# Patient Record
Sex: Male | Born: 1945
Health system: Southern US, Community
[De-identification: ages and names within clinical notes are randomized; demographics above are authoritative.]

## PROBLEM LIST (undated history)

## (undated) DIAGNOSIS — K2970 Gastritis, unspecified, without bleeding: Secondary | ICD-10-CM

## (undated) DIAGNOSIS — E7800 Pure hypercholesterolemia, unspecified: Secondary | ICD-10-CM

## (undated) DIAGNOSIS — I2510 Atherosclerotic heart disease of native coronary artery without angina pectoris: Secondary | ICD-10-CM

## (undated) DIAGNOSIS — E46 Unspecified protein-calorie malnutrition: Secondary | ICD-10-CM

## (undated) DIAGNOSIS — Z720 Tobacco use: Secondary | ICD-10-CM

## (undated) DIAGNOSIS — I219 Acute myocardial infarction, unspecified: Secondary | ICD-10-CM

## (undated) DIAGNOSIS — M1990 Unspecified osteoarthritis, unspecified site: Secondary | ICD-10-CM

## (undated) DIAGNOSIS — F32A Depression, unspecified: Secondary | ICD-10-CM

## (undated) DIAGNOSIS — K2210 Ulcer of esophagus without bleeding: Secondary | ICD-10-CM

## (undated) DIAGNOSIS — I10 Essential (primary) hypertension: Secondary | ICD-10-CM

## (undated) DIAGNOSIS — K219 Gastro-esophageal reflux disease without esophagitis: Secondary | ICD-10-CM

## (undated) DIAGNOSIS — F329 Major depressive disorder, single episode, unspecified: Secondary | ICD-10-CM

## (undated) DIAGNOSIS — I714 Abdominal aortic aneurysm, without rupture, unspecified: Secondary | ICD-10-CM

## (undated) DIAGNOSIS — J9383 Other pneumothorax: Secondary | ICD-10-CM

## (undated) DIAGNOSIS — R001 Bradycardia, unspecified: Secondary | ICD-10-CM

## (undated) DIAGNOSIS — L0390 Cellulitis, unspecified: Secondary | ICD-10-CM

## (undated) DIAGNOSIS — J939 Pneumothorax, unspecified: Secondary | ICD-10-CM

## (undated) DIAGNOSIS — D649 Anemia, unspecified: Secondary | ICD-10-CM

## (undated) DIAGNOSIS — Z9889 Other specified postprocedural states: Secondary | ICD-10-CM

## (undated) DIAGNOSIS — K1120 Sialoadenitis, unspecified: Secondary | ICD-10-CM

## (undated) DIAGNOSIS — O2230 Deep phlebothrombosis in pregnancy, unspecified trimester: Secondary | ICD-10-CM

## (undated) DIAGNOSIS — K5792 Diverticulitis of intestine, part unspecified, without perforation or abscess without bleeding: Secondary | ICD-10-CM

## (undated) DIAGNOSIS — R112 Nausea with vomiting, unspecified: Secondary | ICD-10-CM

## (undated) DIAGNOSIS — E349 Endocrine disorder, unspecified: Secondary | ICD-10-CM

## (undated) DIAGNOSIS — D46Z Other myelodysplastic syndromes: Principal | ICD-10-CM

## (undated) HISTORY — DX: Atherosclerotic heart disease of native coronary artery without angina pectoris: I25.10

## (undated) HISTORY — PX: FETAL BLOOD TRANSFUSION: SHX1602

## (undated) HISTORY — DX: Other myelodysplastic syndromes: D46.Z

## (undated) HISTORY — PX: BACK SURGERY: SHX140

## (undated) HISTORY — PX: EYE SURGERY: SHX253

## (undated) HISTORY — DX: Endocrine disorder, unspecified: E34.9

## (undated) HISTORY — DX: Pure hypercholesterolemia, unspecified: E78.00

---

## 1999-02-15 ENCOUNTER — Encounter: Admission: RE | Admit: 1999-02-15 | Discharge: 1999-02-15 | Payer: Self-pay | Admitting: Family Medicine

## 1999-02-15 ENCOUNTER — Encounter: Payer: Self-pay | Admitting: Family Medicine

## 1999-11-02 ENCOUNTER — Encounter: Payer: Self-pay | Admitting: Physical Medicine and Rehabilitation

## 1999-11-02 ENCOUNTER — Encounter
Admission: RE | Admit: 1999-11-02 | Discharge: 1999-11-02 | Payer: Self-pay | Admitting: Physical Medicine and Rehabilitation

## 2004-10-01 ENCOUNTER — Encounter: Admission: RE | Admit: 2004-10-01 | Discharge: 2004-10-01 | Payer: Self-pay | Admitting: Family Medicine

## 2007-10-17 ENCOUNTER — Emergency Department (HOSPITAL_COMMUNITY): Admission: EM | Admit: 2007-10-17 | Discharge: 2007-10-17 | Payer: Self-pay | Admitting: Emergency Medicine

## 2008-01-15 DIAGNOSIS — I219 Acute myocardial infarction, unspecified: Secondary | ICD-10-CM

## 2008-01-15 HISTORY — PX: OTHER SURGICAL HISTORY: SHX169

## 2008-01-15 HISTORY — DX: Acute myocardial infarction, unspecified: I21.9

## 2008-10-20 ENCOUNTER — Ambulatory Visit: Payer: Self-pay | Admitting: Cardiovascular Disease

## 2008-10-20 ENCOUNTER — Inpatient Hospital Stay (HOSPITAL_COMMUNITY): Admission: EM | Admit: 2008-10-20 | Discharge: 2008-10-22 | Payer: Self-pay | Admitting: Cardiovascular Disease

## 2008-10-21 ENCOUNTER — Encounter: Payer: Self-pay | Admitting: Cardiovascular Disease

## 2008-10-23 ENCOUNTER — Telehealth (INDEPENDENT_AMBULATORY_CARE_PROVIDER_SITE_OTHER): Payer: Self-pay | Admitting: Physician Assistant

## 2008-10-24 ENCOUNTER — Encounter: Payer: Self-pay | Admitting: Cardiovascular Disease

## 2008-10-25 ENCOUNTER — Ambulatory Visit (HOSPITAL_COMMUNITY): Admission: RE | Admit: 2008-10-25 | Discharge: 2008-10-25 | Payer: Self-pay | Admitting: Cardiovascular Disease

## 2008-10-25 ENCOUNTER — Ambulatory Visit: Payer: Self-pay | Admitting: Cardiovascular Disease

## 2008-10-31 ENCOUNTER — Encounter: Payer: Self-pay | Admitting: Cardiovascular Disease

## 2008-11-01 ENCOUNTER — Telehealth: Payer: Self-pay | Admitting: Cardiovascular Disease

## 2008-11-10 ENCOUNTER — Telehealth: Payer: Self-pay | Admitting: Cardiovascular Disease

## 2008-11-11 DIAGNOSIS — I2510 Atherosclerotic heart disease of native coronary artery without angina pectoris: Secondary | ICD-10-CM

## 2008-11-11 DIAGNOSIS — I10 Essential (primary) hypertension: Secondary | ICD-10-CM | POA: Insufficient documentation

## 2008-11-14 ENCOUNTER — Telehealth: Payer: Self-pay | Admitting: Cardiovascular Disease

## 2008-11-14 ENCOUNTER — Telehealth (INDEPENDENT_AMBULATORY_CARE_PROVIDER_SITE_OTHER): Payer: Self-pay | Admitting: *Deleted

## 2008-11-15 ENCOUNTER — Ambulatory Visit: Payer: Self-pay | Admitting: Cardiology

## 2008-11-15 ENCOUNTER — Encounter (HOSPITAL_COMMUNITY): Admission: RE | Admit: 2008-11-15 | Discharge: 2009-01-12 | Payer: Self-pay | Admitting: Cardiovascular Disease

## 2008-11-15 ENCOUNTER — Ambulatory Visit: Payer: Self-pay

## 2008-11-15 ENCOUNTER — Ambulatory Visit: Payer: Self-pay | Admitting: Cardiovascular Disease

## 2008-11-18 ENCOUNTER — Telehealth: Payer: Self-pay | Admitting: Nurse Practitioner

## 2008-12-16 ENCOUNTER — Telehealth: Payer: Self-pay | Admitting: Cardiovascular Disease

## 2008-12-17 ENCOUNTER — Telehealth (INDEPENDENT_AMBULATORY_CARE_PROVIDER_SITE_OTHER): Payer: Self-pay | Admitting: Physician Assistant

## 2008-12-20 ENCOUNTER — Ambulatory Visit: Payer: Self-pay | Admitting: Cardiovascular Disease

## 2009-01-12 ENCOUNTER — Encounter: Payer: Self-pay | Admitting: Cardiovascular Disease

## 2009-01-30 ENCOUNTER — Ambulatory Visit: Payer: Self-pay | Admitting: Cardiovascular Disease

## 2009-05-02 ENCOUNTER — Ambulatory Visit: Payer: Self-pay | Admitting: Cardiovascular Disease

## 2009-05-03 LAB — CONVERTED CEMR LAB
ALT: 32 units/L (ref 0–53)
Albumin: 4.1 g/dL (ref 3.5–5.2)
Cholesterol: 137 mg/dL (ref 0–200)
HDL: 32 mg/dL — ABNORMAL LOW (ref >39.00)
Total Protein: 7.3 g/dL (ref 6.0–8.3)
VLDL: 73 mg/dL — ABNORMAL HIGH (ref 0.0–40.0)

## 2009-08-02 ENCOUNTER — Ambulatory Visit: Payer: Self-pay | Admitting: Cardiovascular Disease

## 2009-08-17 ENCOUNTER — Telehealth: Payer: Self-pay | Admitting: Cardiovascular Disease

## 2009-08-17 LAB — CONVERTED CEMR LAB
ALT: 30 units/L (ref 0–53)
AST: 28 units/L (ref 0–37)
Alkaline Phosphatase: 78 units/L (ref 39–117)
Cholesterol: 151 mg/dL (ref 0–200)
Direct LDL: 42.9 mg/dL
Total Bilirubin: 0.4 mg/dL (ref 0.3–1.2)
Total CHOL/HDL Ratio: 6
Triglycerides: 438 mg/dL — ABNORMAL HIGH (ref 0.0–149.0)

## 2009-09-14 ENCOUNTER — Telehealth: Payer: Self-pay | Admitting: Cardiovascular Disease

## 2009-10-25 ENCOUNTER — Telehealth: Payer: Self-pay | Admitting: Cardiovascular Disease

## 2009-11-20 ENCOUNTER — Ambulatory Visit: Payer: Self-pay | Admitting: Cardiovascular Disease

## 2009-11-23 LAB — CONVERTED CEMR LAB
Alkaline Phosphatase: 77 units/L (ref 39–117)
Bilirubin, Direct: 0.1 mg/dL (ref 0.0–0.3)
HDL: 26.8 mg/dL — ABNORMAL LOW (ref >39.00)
Total Bilirubin: 0.5 mg/dL (ref 0.3–1.2)
Total Protein: 6.9 g/dL (ref 6.0–8.3)
VLDL: 54.6 mg/dL — ABNORMAL HIGH (ref 0.0–40.0)

## 2010-02-11 LAB — CONVERTED CEMR LAB
ALT: 37 units/L (ref 0–53)
AST: 29 units/L (ref 0–37)
Albumin: 4.3 g/dL (ref 3.5–5.2)
Alkaline Phosphatase: 80 units/L (ref 39–117)
BUN: 22 mg/dL (ref 6–23)
Bilirubin, Direct: 0 mg/dL (ref 0.0–0.3)
CO2: 27 meq/L (ref 19–32)
Calcium: 9.3 mg/dL (ref 8.4–10.5)
Chloride: 105 meq/L (ref 96–112)
Cholesterol: 130 mg/dL (ref 0–200)
Creatinine, Ser: 1.3 mg/dL (ref 0.4–1.5)
Direct LDL: 53.5 mg/dL
GFR calc non Af Amer: 59.45 mL/min (ref >60)
Glucose, Bld: 108 mg/dL — ABNORMAL HIGH (ref 70–99)
HDL: 27.1 mg/dL — ABNORMAL LOW (ref >39.00)
Potassium: 4.2 meq/L (ref 3.5–5.1)
Sodium: 140 meq/L (ref 135–145)
Total Bilirubin: 0.9 mg/dL (ref 0.3–1.2)
Total CHOL/HDL Ratio: 5
Total Protein: 7.6 g/dL (ref 6.0–8.3)
Triglycerides: 231 mg/dL — ABNORMAL HIGH (ref 0.0–149.0)
VLDL: 46.2 mg/dL — ABNORMAL HIGH (ref 0.0–40.0)

## 2010-02-15 NOTE — Letter (Signed)
Summary: [facility]  [facility]   Imported By: Marylou Mccoy 02/01/2009 18:26:40  _____________________________________________________________________  External Attachment:    Type:   Image     Comment:   External Document

## 2010-02-15 NOTE — Assessment & Plan Note (Signed)
Summary: per check out/sf   Visit Type:  Follow-up Primary Provider:  Dr Tiburcio Pea   History of Present Illness: This is a 65 year old gentleman who presented with an inferior myocardial infarction in October of 2010. He was treated with stenting of the right coronary artery, which was totally occluded. He was also noted to have moderate distal left main stem disease and moderate left circumflex stenosis. He underwent followup intravascular ultrasound of left mainstem which confirmed moderate stenosis. He presents today for followup evaluation.  He feels well at present. He is exercising regularly without exertional symptoms. Previously was concerned about diaphoresis with activity but this has resolved since he stopped Chantix. The patient denies chest pain, dyspnea, orthopnea, PND, edema, palpitations, lightheadedness, or syncope.   Current Medications (verified): 1)  Metoprolol Tartrate 25 Mg Tabs (Metoprolol Tartrate) .... Take One-Half  Tablet By Mouth Twice A Day 2)  Zocor 40 Mg Tabs (Simvastatin) .... Take 1 Tab By Mouth At Bedtime 3)  Aspirin Ec 325 Mg Tbec (Aspirin) .... Take One Tablet By Mouth Daily 4)  Effient 10 Mg Tabs (Prasugrel Hcl) .... Take 1 Tablet By Mouth Once A Day 5)  Nitrostat 0.4 Mg Subl (Nitroglycerin) .Marland Kitchen.. 1 Tablet Under Tongue At Onset of Chest Pain; You May Repeat Every 5 Minutes For Up To 3 Doses. 6)  Xanax 0.5 Mg Tabs (Alprazolam) .... As Needed 7)  Fexofenadine Hcl 180 Mg Tabs (Fexofenadine Hcl) .... Take 1 Tablet By Mouth Once A Day 8)  Fish Oil 1000 Mg Caps (Omega-3 Fatty Acids) .... Take 1 Capsule By Mouth Once A Day 9)  Multivitamins  Tabs (Multiple Vitamin) .... Take 1 Tablet By Mouth Once A Day  Allergies (verified): No Known Drug Allergies  Past History:  Past medical history reviewed for relevance to current acute and chronic problems.  Past Medical History: 1. CAD status post inferior MI October 2010 and residual moderate left main disease 2.  Dyslipidemia 3. Anxiety disorder not otherwise specified  Review of Systems       Negative except as per HPI   Vital Signs:  Patient profile:   65 year old male Height:      71 inches Weight:      203 pounds BMI:     28.42 Pulse rate:   60 / minute Pulse rhythm:   regular Resp:     18 per minute BP sitting:   120 / 80  (left arm) Cuff size:   large  Vitals Entered By: Vikki Ports (January 30, 2009 10:11 AM)  Physical Exam  General:  Pt is alert and oriented, in no acute distress. HEENT: normal Neck: normal carotid upstrokes without bruits, JVP normal Lungs: CTA CV: RRR without murmur or gallop Abd: soft, NT, positive BS, no bruit, no organomegaly Ext: no clubbing, cyanosis, or edema. peripheral pulses 2+ and equal Skin: warm and dry without rash    Impression & Recommendations:  Problem # 1:  CAD (ICD-414.00) Stable without angina. Left main was borderline by IVUS, but myoview stress was negative and pt is without symptoms at present. Continue current medical therapy. At 3 month f/u visit, will decrease ASA dose to 81 mg since he is on Effient.  His updated medication list for this problem includes:    Metoprolol Tartrate 25 Mg Tabs (Metoprolol tartrate) .Marland Kitchen... Take one-half  tablet by mouth twice a day    Aspirin Ec 325 Mg Tbec (Aspirin) .Marland Kitchen... Take one tablet by mouth daily    Effient 10 Mg  Tabs (Prasugrel hcl) .Marland Kitchen... Take 1 tablet by mouth once a day    Nitrostat 0.4 Mg Subl (Nitroglycerin) .Marland Kitchen... 1 tablet under tongue at onset of chest pain; you may repeat every 5 minutes for up to 3 doses.  Problem # 2:  HYPERTENSION, UNSPECIFIED (ICD-401.9) BP controlled on metoprolol alone.  His updated medication list for this problem includes:    Metoprolol Tartrate 25 Mg Tabs (Metoprolol tartrate) .Marland Kitchen... Take one-half  tablet by mouth twice a day    Aspirin Ec 325 Mg Tbec (Aspirin) .Marland Kitchen... Take one tablet by mouth daily  BP today: 120/80 Prior BP: 128/79 (12/20/2008)  Labs  Reviewed: K+: 4.2 (12/20/2008) Creat: : 1.3 (12/20/2008)   Chol: 130 (12/20/2008)   HDL: 27.10 (12/20/2008)   TG: 231.0 (12/20/2008)  Problem # 3:  HYPERLIPIDEMIA (ICD-272.4) LDL 53 at recent check, LFT's within normal limits. Continue current therapy.  His updated medication list for this problem includes:    Zocor 40 Mg Tabs (Simvastatin) .Marland Kitchen... Take 1 tab by mouth at bedtime  CHOL: 130 (12/20/2008)   HDL: 27.10 (12/20/2008)   TG: 231.0 (12/20/2008)  Patient Instructions: 1)  Your physician recommends that you schedule a follow-up appointment in: 3 months 2)  Your physician recommends that you return for lab work in: at your next visitliver/lipids please fast

## 2010-02-15 NOTE — Progress Notes (Signed)
Summary: Lab results  Phone Note Call from Patient Call back at Home Phone [phone]   Caller: Patient Reason for Call: Lab or Test Results Initial call taken by: Judie Grieve,  August 17, 2009 2:05 PM  Follow-up for Phone Call        Left message for pt to call back. Julieta Gutting, RN, BSN  August 17, 2009 4:19 PM  The pt called back and was given instructions to stop simvastatin and start Crestor and Niaspan.  The pt was given samples that would last one month.  Crestor 10mg  #28 and Niaspan 500mg  #42. The pt will start Niaspan 500mg  daily for 2 weeks then increase to 1000mg  daily.  Information given about side effects and taking ASA 30 minutes prior to Niaspan.  The pt will call back in one month to let us know how he tolerates these medications and to obtain prescriptions.  Labs will be rechecked the week of 11/20/09.  All instructions were written out for the pt and placed at the front desk with samples.  Follow-up by: Julieta Gutting, RN, BSN,  August 17, 2009 4:51 PM    New/Updated Medications: CRESTOR 10 MG TABS (ROSUVASTATIN CALCIUM) Take one tablet by mouth daily at bedtime NIASPAN 500 MG CR-TABS (NIACIN (ANTIHYPERLIPIDEMIC)) take as directed

## 2010-02-15 NOTE — Progress Notes (Signed)
Summary: Cholesterol Med  Phone Note Call from Patient   Caller: Patient Reason for Call: Talk to Nurse Summary of Call: pt has a question re med-pls call [phone] Initial call taken by: Glynda Jaeger,  September 14, 2009 10:19 AM  Follow-up for Phone Call        Left message to call back. Julieta Gutting, RN, BSN  September 14, 2009 1:41 PM  I spoke with the pt and he tolerated the Crestor but could not tolerate the 1000mg  of Niaspan due to stomach upset. The pt did fine on 500mg  of Niaspan.  The pt will stay on Crestor 10mg  and Niaspan 500mg  daily.  The pt is scheduled for f/u labs.  Rx sent to pharmacy.    Follow-up by: Julieta Gutting, RN, BSN,  September 14, 2009 3:28 PM    New/Updated Medications: NIASPAN 500 MG CR-TABS (NIACIN (ANTIHYPERLIPIDEMIC)) take one tablet daily Prescriptions: CRESTOR 10 MG TABS (ROSUVASTATIN CALCIUM) Take one tablet by mouth daily at bedtime  #30 x 6   Entered by:   Julieta Gutting, RN, BSN   Authorized by:   Norva Karvonen, MD   Signed by:   Julieta Gutting, RN, BSN on 09/14/2009   Method used:   Electronically to        [facility] (retail)       [address]       [place], [place]  [address]       Ph: [phone]       Fax: [phone]   RxID:   [id number] NIASPAN 500 MG CR-TABS (NIACIN (ANTIHYPERLIPIDEMIC)) take one tablet daily  #30 x 6   Entered by:   Julieta Gutting, RN, BSN   Authorized by:   Norva Karvonen, MD   Signed by:   Julieta Gutting, RN, BSN on 09/14/2009   Method used:   Electronically to        [facility] (retail)       [address]       [place], [place]  [address]       Ph: [phone]       Fax: [phone]   RxID:   [id number]

## 2010-02-15 NOTE — Assessment & Plan Note (Signed)
Summary: 3 MONTH ROV   Visit Type:  Follow-up Primary Provider:  Dr Tiburcio Pea  CC:  none.  History of Present Illness: This is a 65 year old gentleman who presented with an inferior myocardial infarction in October of 2010. He was treated with stenting of the right coronary artery, which was totally occluded. He was also noted to have moderate distal left main stem disease and moderate left circumflex stenosis. He underwent followup intravascular ultrasound of left mainstem which confirmed moderate stenosis. He presents today for followup evaluation.  He has gained weight since quitting cigarettes. Otherwise doing well. No complaints - denies chest pain, dyspnea, orthopnea, PND, edema, palpitations, lightheadedness, or syncope.  He has previously had some diaphoretic spells, but this was attributed to Chantix and these have stopped since off of this medication.    Current Medications (verified): 1)  Metoprolol Tartrate 25 Mg Tabs (Metoprolol Tartrate) .... Take One-Half  Tablet By Mouth Twice A Day 2)  Zocor 40 Mg Tabs (Simvastatin) .... Take 1 Tab By Mouth At Bedtime 3)  Aspirin Ec 325 Mg Tbec (Aspirin) .... Take One Tablet By Mouth Daily 4)  Effient 10 Mg Tabs (Prasugrel Hcl) .... Take 1 Tablet By Mouth Once A Day 5)  Nitrostat 0.4 Mg Subl (Nitroglycerin) .Marland Kitchen.. 1 Tablet Under Tongue At Onset of Chest Pain; You May Repeat Every 5 Minutes For Up To 3 Doses. 6)  Xanax 0.5 Mg Tabs (Alprazolam) .... As Needed 7)  Fexofenadine Hcl 180 Mg Tabs (Fexofenadine Hcl) .... Take 1 Tablet By Mouth Once A Day 8)  Fish Oil 1000 Mg Caps (Omega-3 Fatty Acids) .... Take 1 Capsule By Mouth Once A Day 9)  Multivitamins  Tabs (Multiple Vitamin) .... Take 1 Tablet By Mouth Once A Day 10)  Amoxicillin 500 Mg Caps (Amoxicillin) .Marland Kitchen.. 1 Capsule Two Times A Day  Allergies (verified): No Known Drug Allergies  Past History:  Past medical history reviewed for relevance to current acute and chronic problems.  Past  Medical History: Reviewed history from 01/30/2009 and no changes required. 1. CAD status post inferior MI October 2010 and residual moderate left main disease 2. Dyslipidemia 3. Anxiety disorder not otherwise specified  Review of Systems       Negative except as per HPI   Vital Signs:  Patient profile:   65 year old male Height:      71 inches Weight:      208 pounds BMI:     29.11 Pulse rate:   53 / minute Resp:     16 per minute BP sitting:   130 / 90  (left arm)  Vitals Entered By: Laurance Flatten CMA (May 02, 2009 9:47 AM)  Physical Exam  General:  Pt is alert and oriented, obese male, in no acute distress. HEENT: normal Neck: normal carotid upstrokes without bruits, JVP normal Lungs: CTA CV: RRR without murmur or gallop Abd: soft, NT, positive BS, no bruit, no organomegaly Ext: no clubbing, cyanosis, or edema. peripheral pulses 2+ and equal Skin: warm and dry without rash    Impression & Recommendations:  Problem # 1:  CAD (ICD-414.00) Pt s/p MI with residual left main CAD and negative Myoview post-MI. Will continue his current medical program. Encouraged increased exercise and dietary modification. Will reduce ASA dose to 81 mg since the pt is on prasugrel (to lower long-term bleeding risk).  His updated medication list for this problem includes:    Metoprolol Tartrate 25 Mg Tabs (Metoprolol tartrate) .Marland Kitchen... Take one-half  tablet by  mouth twice a day    Aspirin 81 Mg Tbec (Aspirin) .Marland Kitchen... Take one tablet by mouth daily    Effient 10 Mg Tabs (Prasugrel hcl) .Marland Kitchen... Take 1 tablet by mouth once a day    Nitrostat 0.4 Mg Subl (Nitroglycerin) .Marland Kitchen... 1 tablet under tongue at onset of chest pain; you may repeat every 5 minutes for up to 3 doses.  Orders: TLB-Lipid Panel (80061-LIPID) TLB-Hepatic/Liver Function Pnl (80076-HEPATIC)  Problem # 2:  HYPERLIPIDEMIA (ICD-272.4) Check lipids. Pt with LDL at goal but HDL cholesterol has been low. Will review lipid panel when  results back.  His updated medication list for this problem includes:    Zocor 40 Mg Tabs (Simvastatin) .Marland Kitchen... Take 1 tab by mouth at bedtime  Orders: TLB-Lipid Panel (80061-LIPID) TLB-Hepatic/Liver Function Pnl (80076-HEPATIC)  CHOL: 130 (12/20/2008)   HDL: 27.10 (12/20/2008)   TG: 231.0 (12/20/2008)  Problem # 3:  HYPERTENSION, UNSPECIFIED (ICD-401.9) BP borderline elevated. Trial of lifestyle modification.  His updated medication list for this problem includes:    Metoprolol Tartrate 25 Mg Tabs (Metoprolol tartrate) .Marland Kitchen... Take one-half  tablet by mouth twice a day    Aspirin 81 Mg Tbec (Aspirin) .Marland Kitchen... Take one tablet by mouth daily  Orders: TLB-Lipid Panel (80061-LIPID) TLB-Hepatic/Liver Function Pnl (80076-HEPATIC)  BP today: 130/90 Prior BP: 120/80 (01/30/2009)  Labs Reviewed: K+: 4.2 (12/20/2008) Creat: : 1.3 (12/20/2008)   Chol: 130 (12/20/2008)   HDL: 27.10 (12/20/2008)   TG: 231.0 (12/20/2008)  Patient Instructions: 1)  Your physician recommends that you have a lipid and liver profile today.  2)  Your physician has recommended you make the following change in your medication: DECREASE Aspirin to 81mg  once a day 3)  Your physician wants you to follow-up in:   6 MONTHS. You will receive a reminder letter in the mail two months in advance. If you don't receive a letter, please call our office to schedule the follow-up appointment.

## 2010-02-15 NOTE — Progress Notes (Signed)
Summary: D/C Niaspan  Phone Note Call from Patient   Caller: Patient [phone] Reason for Call: Talk to Nurse Summary of Call: pt calling has question re med Initial call taken by: Glynda Jaeger,  October 25, 2009 8:59 AM  Follow-up for Phone Call        I spoke with the pt and he cannot tolerate Niaspan.  The pt said he cannot sleep at night and feels warm.  The pt will stop Niaspan at this time.  The pt will have scheduled labs drawn in November.  Follow-up by: Julieta Gutting, RN, BSN,  October 25, 2009 10:00 AM

## 2010-04-19 LAB — COMPREHENSIVE METABOLIC PANEL
ALT: 29 U/L (ref 0–53)
Alkaline Phosphatase: 89 U/L (ref 39–117)
BUN: 19 mg/dL (ref 6–23)
CO2: 25 mEq/L (ref 19–32)
Calcium: 8.7 mg/dL (ref 8.4–10.5)
GFR calc non Af Amer: 51 mL/min — ABNORMAL LOW (ref >60)
Glucose, Bld: 111 mg/dL — ABNORMAL HIGH (ref 70–99)
Potassium: 4.4 mEq/L (ref 3.5–5.1)
Total Protein: 6.9 g/dL (ref 6.0–8.3)

## 2010-04-19 LAB — PROTIME-INR: Prothrombin Time: 19.5 seconds — ABNORMAL HIGH (ref 11.6–15.2)

## 2010-04-19 LAB — BASIC METABOLIC PANEL
BUN: 15 mg/dL (ref 6–23)
CO2: 23 mEq/L (ref 19–32)
Calcium: 8.8 mg/dL (ref 8.4–10.5)
GFR calc Af Amer: >60 mL/min (ref >60)
GFR calc non Af Amer: >60 mL/min (ref >60)
GFR calc non Af Amer: >60 mL/min (ref >60)
Glucose, Bld: 111 mg/dL — ABNORMAL HIGH (ref 70–99)
Potassium: 3.8 mEq/L (ref 3.5–5.1)
Sodium: 134 mEq/L — ABNORMAL LOW (ref 135–145)
Sodium: 140 mEq/L (ref 135–145)

## 2010-04-19 LAB — CBC
HCT: 39.1 % (ref 39.0–52.0)
Hemoglobin: 12.7 g/dL — ABNORMAL LOW (ref 13.0–17.0)
Hemoglobin: 13.5 g/dL (ref 13.0–17.0)
MCHC: 34.4 g/dL (ref 30.0–36.0)
MCHC: 34.7 g/dL (ref 30.0–36.0)
MCHC: 35.2 g/dL (ref 30.0–36.0)
MCV: 95.8 fL (ref 78.0–100.0)
Platelets: 209 10*3/uL (ref 150–400)
RBC: 4.1 MIL/uL — ABNORMAL LOW (ref 4.22–5.81)
RDW: 13.8 % (ref 11.5–15.5)
RDW: 13.9 % (ref 11.5–15.5)
RDW: 14.3 % (ref 11.5–15.5)

## 2010-04-19 LAB — CARDIAC PANEL(CRET KIN+CKTOT+MB+TROPI)
CK, MB: 15.1 ng/mL — ABNORMAL HIGH (ref 0.3–4.0)
CK, MB: 48.1 ng/mL — ABNORMAL HIGH (ref 0.3–4.0)
CK, MB: 68 ng/mL — ABNORMAL HIGH (ref 0.3–4.0)
Relative Index: 7.3 — ABNORMAL HIGH (ref 0.0–2.5)
Total CK: 478 U/L — ABNORMAL HIGH (ref 7–232)
Total CK: 656 U/L — ABNORMAL HIGH (ref 7–232)
Total CK: 800 U/L — ABNORMAL HIGH (ref 7–232)
Troponin I: 2.79 ng/mL (ref 0.00–0.06)

## 2010-04-19 LAB — LIPID PANEL: Cholesterol: 139 mg/dL (ref 0–200)

## 2010-04-19 LAB — MAGNESIUM: Magnesium: 2 mg/dL (ref 1.5–2.5)

## 2010-04-19 LAB — TSH: TSH: 1.824 u[IU]/mL (ref 0.350–4.500)

## 2010-04-25 ENCOUNTER — Other Ambulatory Visit: Payer: Self-pay | Admitting: Cardiovascular Disease

## 2010-05-14 ENCOUNTER — Other Ambulatory Visit: Payer: Self-pay | Admitting: Cardiovascular Disease

## 2010-05-29 NOTE — Assessment & Plan Note (Signed)
[facility]                                 ON-CALL NOTE   BRINSON, TOZZI                         MRN:          [id number]  DATE:10/30/2008                            DOB:          1945/11/21    PRIMARY CARDIOLOGIST:  Veverly Fells. Excell Seltzer, MD   PROBLEM:  Mr. Brickley wife called, reporting that he has developed a  cough since his recent hospitalization.  He is not reporting any fever,  and denies any chest pain.  Of note, he continues to smoke, despite  being discharged on Chantix.  His wife reviewed his medications with me,  and an ACE inhibitor is not listed.   PLAN:  I reassured the patient's wife that his symptoms do not appear to  be cardiac in origin, nor related to the medications that he is on.  I  recommended a watchful waiting, symptomatic treatment with over-the-  counter Robitussin, and that he can resume Allegra, which he was on  before.  If his symptoms worsen, I advised them to contact his primary  care physician.  The patient was quite agreeable with this plan.     Gene Serpe, PA-C  Electronically Signed    GS/MedQ  DD: 10/30/2008  DT: 10/31/2008  Job #: [id number]

## 2010-06-06 ENCOUNTER — Other Ambulatory Visit: Payer: Self-pay | Admitting: Cardiovascular Disease

## 2010-10-16 LAB — BASIC METABOLIC PANEL
Calcium: 9.3
Creatinine, Ser: 1.38
GFR calc non Af Amer: 53 — ABNORMAL LOW
Glucose, Bld: 133 — ABNORMAL HIGH
Sodium: 138

## 2010-10-16 LAB — DIFFERENTIAL
Basophils Absolute: 0.1
Lymphocytes Relative: 12
Monocytes Absolute: 0.6
Neutro Abs: 8.6 — ABNORMAL HIGH
Neutrophils Relative %: 81 — ABNORMAL HIGH

## 2010-10-16 LAB — POCT CARDIAC MARKERS
CKMB, poc: 4.1
Myoglobin, poc: 235
Troponin i, poc: <0.05

## 2010-10-16 LAB — CBC
Hemoglobin: 14.3
Platelets: 286
RDW: 13.4

## 2010-12-03 ENCOUNTER — Other Ambulatory Visit: Payer: Self-pay | Admitting: Cardiovascular Disease

## 2010-12-03 MED ORDER — METOPROLOL TARTRATE 25 MG PO TABS: 25.0000 mg | ORAL_TABLET | Freq: Two times a day (BID) | ORAL | Status: DC

## 2010-12-03 NOTE — Telephone Encounter (Signed)
New message:  Patient is out of medication. Please call them when this has been called if it can be refilled. [phone].

## 2010-12-10 ENCOUNTER — Other Ambulatory Visit: Payer: Self-pay | Admitting: Cardiovascular Disease

## 2010-12-11 ENCOUNTER — Other Ambulatory Visit: Payer: Self-pay | Admitting: Cardiovascular Disease

## 2010-12-12 MED ORDER — ROSUVASTATIN CALCIUM 10 MG PO TABS: 10.0000 mg | ORAL_TABLET | Freq: Every day | ORAL | Status: DC

## 2010-12-25 ENCOUNTER — Ambulatory Visit (INDEPENDENT_AMBULATORY_CARE_PROVIDER_SITE_OTHER): Payer: Self-pay | Admitting: Cardiovascular Disease

## 2010-12-25 ENCOUNTER — Encounter: Payer: Self-pay | Admitting: Cardiovascular Disease

## 2010-12-25 VITALS — BP 138/88 | HR 58 | Ht 70.0 in | Wt 205.0 lb

## 2010-12-25 DIAGNOSIS — I10 Essential (primary) hypertension: Secondary | ICD-10-CM

## 2010-12-25 DIAGNOSIS — I2510 Atherosclerotic heart disease of native coronary artery without angina pectoris: Secondary | ICD-10-CM

## 2010-12-25 DIAGNOSIS — E785 Hyperlipidemia, unspecified: Secondary | ICD-10-CM

## 2010-12-25 MED ORDER — NITROGLYCERIN 0.4 MG SL SUBL: 0.4000 mg | SUBLINGUAL_TABLET | SUBLINGUAL | Status: DC | PRN

## 2010-12-25 NOTE — Assessment & Plan Note (Signed)
The patient is stable without anginal symptoms. His modifiable risk factors are well controlled. He is now 2 years out from his infarction I think the risk benefit ratio is in favor of discontinuing effient. He has moderate residual left main stenosis and followup stress testing showed no significant ischemia. We discussed the possibility of a repeat stress test but the patient has low back problems and is unable to walk on treadmill. I think we should continue with his medical therapy for now. I would like to see him back in one year for followup evaluation.

## 2010-12-25 NOTE — Progress Notes (Signed)
HPI:  65 year old gentleman presenting for follow up evaluation. The patient initially presented in 2010 with an acute inferior wall MI. He was treated with primary PCI the right coronary artery. He was noted to have moderate distal left main stem stenosis and moderate left circumflex stenosis. Followup IVUS of the left main confirmed moderate stenosis and the patient has been treated medically. A Myoview stress scan from November 2010 showed no significant ischemia. The left ventricular ejection fraction was 65%.  He presents today for routine followup.  The patient feels well. He denies chest pain or pressure. He denies dyspnea, edema, palpitations, orthopnea, PND, lightheadedness, or syncope. He has not engaged in routine exercise but he remains active. He likes to work in his yard and he reports no symptoms at that level of exertion.  Outpatient Encounter Prescriptions as of 12/25/2010  Medication Sig Dispense Refill  . aspirin 81 MG tablet Take 81 mg by mouth daily.        . fish oil-omega-3 fatty acids 1000 MG capsule Take 2 g by mouth daily.        . metoprolol tartrate (LOPRESSOR) 25 MG tablet Take 1 tablet (25 mg total) by mouth 2 (two) times daily.  30 tablet  2  . nitroGLYCERIN (NITROSTAT) 0.4 MG SL tablet Place 1 tablet (0.4 mg total) under the tongue every 5 (five) minutes as needed.  30 tablet  11  . rosuvastatin (CRESTOR) 10 MG tablet Take 1 tablet (10 mg total) by mouth daily. Patient needs office visit  30 tablet  0  . DISCONTD: EFFIENT 10 MG TABS TAKE ONE TABLET BY MOUTH EVERY DAY  30 each  1  . DISCONTD: nitroGLYCERIN (NITROSTAT) 0.4 MG SL tablet Place 0.4 mg under the tongue every 5 (five) minutes as needed.        Marland Kitchen DISCONTD: ALPRAZolam (XANAX) 0.5 MG tablet Take 0.5 mg by mouth at bedtime as needed. (UNKNOWN)      . DISCONTD: fexofenadine (ALLEGRA) 180 MG tablet Take 180 mg by mouth daily.          No Known Allergies  Past Medical History  Diagnosis Date  . Coronary  atherosclerosis of native coronary artery     inferior wall MI 2010  . Essential hypertension, benign   . Pure hypercholesterolemia     ROS: Negative except as per HPI  BP 138/88  Pulse 58  Ht 5\' 10"  (1.778 m)  Wt 92.987 kg (205 lb)  BMI 29.41 kg/m2  PHYSICAL EXAM: Pt is alert and oriented, Overweight male in NAD HEENT: normal Neck: JVP - normal, carotids 2+= without bruits Lungs: CTA bilaterally CV: RRR without murmur or gallop Abd: soft, NT, Positive BS, no hepatomegaly Ext: no C/C/E, distal pulses intact and equal Skin: warm/dry no rash  EKG:  Sinus bradycardia 51 beats per minute, otherwise within normal limits.  ASSESSMENT AND PLAN:

## 2010-12-25 NOTE — Patient Instructions (Addendum)
Your physician recommends that you return for lab work this week nothing to eat or drink 12 hours before lab work  STOP EFFIENT    Your physician wants you to follow-up in: 12 months You will receive a reminder letter in the mail two months in advance. If you don't receive a letter, please call our office to schedule the follow-up appointment.

## 2010-12-25 NOTE — Assessment & Plan Note (Signed)
Lipid panel notable for low HDL cholesterol. His LDL has been at goal of less than 70. Recommend followup lipid panel. This will be scheduled and the patient is going to come in fasting.

## 2010-12-25 NOTE — Assessment & Plan Note (Signed)
Blood pressure is reasonably well controlled. We reviewed his home readings and they are in good range (lower than his office visit reading today)

## 2010-12-27 ENCOUNTER — Other Ambulatory Visit: Payer: Self-pay | Admitting: Cardiovascular Disease

## 2010-12-27 ENCOUNTER — Other Ambulatory Visit (INDEPENDENT_AMBULATORY_CARE_PROVIDER_SITE_OTHER): Payer: BC Managed Care – PPO | Admitting: *Deleted

## 2010-12-27 DIAGNOSIS — E785 Hyperlipidemia, unspecified: Secondary | ICD-10-CM

## 2010-12-27 LAB — LIPID PANEL
HDL: 28.8 mg/dL — ABNORMAL LOW (ref >39.00)
Total CHOL/HDL Ratio: 4
Triglycerides: 321 mg/dL — ABNORMAL HIGH (ref 0.0–149.0)

## 2010-12-27 LAB — LDL CHOLESTEROL, DIRECT: Direct LDL: 41.6 mg/dL

## 2010-12-27 LAB — HEPATIC FUNCTION PANEL
ALT: 25 U/L (ref 0–53)
Bilirubin, Direct: 0 mg/dL (ref 0.0–0.3)
Total Bilirubin: 0.6 mg/dL (ref 0.3–1.2)

## 2011-01-04 NOTE — Progress Notes (Signed)
Addended by: Iona Coach on: 01/04/2011 09:39 AM   Modules accepted: Orders

## 2011-02-12 ENCOUNTER — Telehealth: Payer: Self-pay | Admitting: Cardiovascular Disease

## 2011-02-12 MED ORDER — ROSUVASTATIN CALCIUM 10 MG PO TABS: 10.0000 mg | ORAL_TABLET | Freq: Every day | ORAL | Status: DC

## 2011-02-12 MED ORDER — METOPROLOL TARTRATE 25 MG PO TABS: 25.0000 mg | ORAL_TABLET | Freq: Two times a day (BID) | ORAL | Status: DC

## 2011-02-12 NOTE — Telephone Encounter (Signed)
New Problem   Patient wife [name] called to request prescription be written with refills until next ROV 12/2011, as the doctor has to be called every month for a 30 day supply (No Refills Left on Meds). Please return call to [name] on hm#

## 2011-02-12 NOTE — Telephone Encounter (Signed)
I spoke with the pt and made him aware that Rx for Metoprolol Tartrate and Crestor were sent to [facility].  Refills will last for one year.

## 2012-02-26 ENCOUNTER — Other Ambulatory Visit: Payer: Self-pay | Admitting: Cardiovascular Disease

## 2012-03-23 ENCOUNTER — Other Ambulatory Visit: Payer: Self-pay | Admitting: Cardiovascular Disease

## 2012-03-24 ENCOUNTER — Encounter: Payer: Self-pay | Admitting: Cardiovascular Disease

## 2012-03-24 ENCOUNTER — Ambulatory Visit (INDEPENDENT_AMBULATORY_CARE_PROVIDER_SITE_OTHER): Payer: Medicare PPO | Admitting: Cardiovascular Disease

## 2012-03-24 VITALS — BP 138/78 | HR 46 | Ht 70.0 in | Wt 205.4 lb

## 2012-03-24 DIAGNOSIS — E785 Hyperlipidemia, unspecified: Secondary | ICD-10-CM

## 2012-03-24 DIAGNOSIS — I2510 Atherosclerotic heart disease of native coronary artery without angina pectoris: Secondary | ICD-10-CM

## 2012-03-24 MED ORDER — NITROGLYCERIN 0.4 MG SL SUBL: 0.4000 mg | SUBLINGUAL_TABLET | SUBLINGUAL | Status: DC | PRN

## 2012-03-24 MED ORDER — METOPROLOL TARTRATE 25 MG PO TABS: ORAL_TABLET | ORAL | Status: DC

## 2012-03-24 NOTE — Progress Notes (Signed)
   HPI:  67 year old gentleman presenting for followup evaluation. The patient has coronary artery disease and he initially presented with an inferior wall MI in 2000 and. He was treated with primary PCI to right coronary artery. He was noted to have moderate distal left main stem stenosis and moderate left circumflex stenosis. Followup intravascular ultrasound was performed. A stress nuclear study showed no significant ischemia. His left ventricular ejection fraction has been normal.  From a symptomatic perspective, he is doing well. He has not engaged in regular exercise but is able to do work without symptoms. He's been fairly sedentary over the winter months. He denies chest pain or pressure, diaphoresis, leg swelling, palpitations, lightheadedness, or syncope. He does admit to mild shortness of breath with exertion.  Outpatient Encounter Prescriptions as of 03/24/2012  Medication Sig Dispense Refill  . aspirin 81 MG tablet Take 81 mg by mouth daily.        . CRESTOR 10 MG tablet TAKE ONE TABLET BY MOUTH EVERY DAY  90 tablet  0  . fish oil-omega-3 fatty acids 1000 MG capsule Take 2 g by mouth daily.        . metoprolol tartrate (LOPRESSOR) 25 MG tablet TAKE ONE TABLET BY MOUTH TWICE DAILY  180 tablet  0  . nitroGLYCERIN (NITROSTAT) 0.4 MG SL tablet Place 1 tablet (0.4 mg total) under the tongue every 5 (five) minutes as needed.  30 tablet  11   No facility-administered encounter medications on file as of 03/24/2012.    No Known Allergies  Past Medical History  Diagnosis Date  . Coronary atherosclerosis of native coronary artery     inferior wall MI 2010  . Essential hypertension, benign   . Pure hypercholesterolemia     BP 138/78  Pulse 46  Ht 5\' 10"  (1.778 m)  Wt 93.169 kg (205 lb 6.4 oz)  BMI 29.47 kg/m2  SpO2 97%  PHYSICAL EXAM: Pt is alert and oriented, pleasant overweight male in NAD HEENT: normal Neck: JVP - normal, carotids 2+= without bruits Lungs: CTA bilaterally CV:  RRR without murmur or gallop Abd: soft, NT, Positive BS, no hepatomegaly Ext: no C/C/E, distal pulses intact and equal Skin: warm/dry no rash  EKG:  Marked sinus bradycardia 46 beats per minute, otherwise within normal limits.  ASSESSMENT AND PLAN: 1. Coronary atherosclerosis, native vessel. The patient is stable without exertional symptoms. I have considered a functional study in the setting of his moderate left main disease. However, he is unable to exercise because of back problems. I do not think nuclear stress testing is indicated at the present time in the absence of symptoms. I would like to see him back in 6 months for followup. We discussed lifestyle modification. He will continue with his current medical program which includes aspirin, a statin drug, and the beta blocker.  2. Hyperlipidemia. The patient is on Crestor 10 mg daily. He does admit to some myalgias, but he can tolerate this. He understands the risk/benefit of a statin drug in the setting of his history myocardial infarction and diffuse coronary artery disease.  Tonny Bollman 03/26/2012 11:57 PM

## 2012-03-24 NOTE — Patient Instructions (Addendum)
Your physician wants you to follow-up in: 6 MONTHS with Dr Excell Seltzer.  You will receive a reminder letter in the mail two months in advance. If you don't receive a letter, please call our office to schedule the follow-up appointment.  Your physician recommends that you return for a FASTING LIPID and LIVER Profile--nothing to eat or drink after midnight, lab opens at 7:30 (03/31/12)  Your physician recommends that you continue on your current medications as directed. Please refer to the Current Medication list given to you today.

## 2012-03-31 ENCOUNTER — Other Ambulatory Visit: Payer: Medicare PPO

## 2012-04-07 ENCOUNTER — Other Ambulatory Visit (INDEPENDENT_AMBULATORY_CARE_PROVIDER_SITE_OTHER): Payer: Medicare PPO

## 2012-04-07 DIAGNOSIS — E785 Hyperlipidemia, unspecified: Secondary | ICD-10-CM

## 2012-04-07 DIAGNOSIS — I2510 Atherosclerotic heart disease of native coronary artery without angina pectoris: Secondary | ICD-10-CM

## 2012-04-07 LAB — HEPATIC FUNCTION PANEL
AST: 28 U/L (ref 0–37)
Albumin: 4 g/dL (ref 3.5–5.2)

## 2012-04-07 LAB — LIPID PANEL
HDL: 22.3 mg/dL — ABNORMAL LOW (ref >39.00)
Total CHOL/HDL Ratio: 4
Triglycerides: 141 mg/dL (ref 0.0–149.0)

## 2012-04-13 ENCOUNTER — Telehealth: Payer: Self-pay | Admitting: Cardiovascular Disease

## 2012-04-13 DIAGNOSIS — E7800 Pure hypercholesterolemia, unspecified: Secondary | ICD-10-CM

## 2012-04-13 MED ORDER — ROSUVASTATIN CALCIUM 10 MG PO TABS: ORAL_TABLET | ORAL | Status: DC

## 2012-04-13 NOTE — Telephone Encounter (Signed)
New problem   Calling for lab results

## 2012-04-13 NOTE — Telephone Encounter (Signed)
Labs reviewed. Recommend trial of atorvastatin 10 mg daily. Followup lipids and LFTs in 12 weeks.

## 2012-04-13 NOTE — Telephone Encounter (Signed)
Our office thought that this pt had requested to be changed to a different statin from Crestor.  I made the pt aware of lab results and he would like to remain on Crestor at this time.  Rx sent to pharmacy.

## 2012-04-13 NOTE — Telephone Encounter (Signed)
Pt had labs drawn on 04/07/12.  Dr Excell Seltzer needs to review labs and make recommendations because the pt would like to change statin.

## 2012-07-09 ENCOUNTER — Other Ambulatory Visit: Payer: Self-pay | Admitting: Orthopedic Surgery

## 2012-07-09 DIAGNOSIS — M542 Cervicalgia: Secondary | ICD-10-CM

## 2012-07-18 ENCOUNTER — Ambulatory Visit
Admission: RE | Admit: 2012-07-18 | Discharge: 2012-07-18 | Disposition: A | Discharge status: Home / Self Care | Payer: Medicare PPO | Source: Ambulatory Visit | Attending: Orthopedic Surgery | Admitting: Orthopedic Surgery

## 2012-07-18 DIAGNOSIS — M542 Cervicalgia: Secondary | ICD-10-CM

## 2012-10-13 ENCOUNTER — Ambulatory Visit (INDEPENDENT_AMBULATORY_CARE_PROVIDER_SITE_OTHER): Payer: Medicare PPO | Admitting: Cardiovascular Disease

## 2012-10-13 ENCOUNTER — Encounter: Payer: Self-pay | Admitting: Cardiovascular Disease

## 2012-10-13 VITALS — BP 150/92 | HR 47 | Ht 70.0 in | Wt 211.0 lb

## 2012-10-13 DIAGNOSIS — I10 Essential (primary) hypertension: Secondary | ICD-10-CM

## 2012-10-13 DIAGNOSIS — E785 Hyperlipidemia, unspecified: Secondary | ICD-10-CM

## 2012-10-13 DIAGNOSIS — I2510 Atherosclerotic heart disease of native coronary artery without angina pectoris: Secondary | ICD-10-CM

## 2012-10-13 MED ORDER — METOPROLOL TARTRATE 25 MG PO TABS: 12.5000 mg | ORAL_TABLET | Freq: Two times a day (BID) | ORAL | Status: DC

## 2012-10-13 MED ORDER — LISINOPRIL 5 MG PO TABS: 5.0000 mg | ORAL_TABLET | Freq: Every day | ORAL | Status: DC

## 2012-10-13 NOTE — Progress Notes (Signed)
   HPI:  67 year old gentleman presenting for followup evaluation. The patient has coronary artery disease and initially presented with an inferior wall MI and 2010. The patient was treated with primary PCI of the right coronary artery. He has moderate distal left main stem stenosis and moderate left circumflex stenosis. He's been treated medically. Followup intravascular ultrasound was performed. The patient underwent a stress nuclear study that showed no significant ischemia. His LV function has been normal. Last lipids from March 2014 showed a cholesterol of 97, HDL 22, LDL 47, and triglycerides 562.  The patient is physically active with work around his house, but has not engaged in regular exercise. He denies chest pain or pressure, dyspnea, or palpitations. He does complain of generalized fatigue. He denies syncope or presyncope. He continues to smoke cigarettes.  Outpatient Encounter Prescriptions as of 10/13/2012  Medication Sig Dispense Refill  . aspirin 81 MG tablet Take 81 mg by mouth daily.        . fish oil-omega-3 fatty acids 1000 MG capsule Take 2 g by mouth daily.        . metoprolol tartrate (LOPRESSOR) 25 MG tablet TAKE ONE TABLET BY MOUTH TWICE DAILY  180 tablet  3  . nitroGLYCERIN (NITROSTAT) 0.4 MG SL tablet Place 1 tablet (0.4 mg total) under the tongue every 5 (five) minutes as needed.  25 tablet  1  . rosuvastatin (CRESTOR) 10 MG tablet TAKE ONE TABLET BY MOUTH EVERY DAY  90 tablet  3   No facility-administered encounter medications on file as of 10/13/2012.    No Known Allergies  Past Medical History  Diagnosis Date  . Coronary atherosclerosis of native coronary artery     inferior wall MI 2010  . Essential hypertension, benign   . Pure hypercholesterolemia     ROS: Negative except as per HPI  BP 150/92  Pulse 47  Ht 5\' 10"  (1.778 m)  Wt 95.709 kg (211 lb)  BMI 30.28 kg/m2  PHYSICAL EXAM: Pt is alert and oriented, NAD HEENT: normal Neck: JVP - normal,  carotids 2+= without bruits Lungs: CTA bilaterally CV: Bradycardic and regular without murmur or gallop Abd: soft, NT, Positive BS, no hepatomegaly Ext: no C/C/E, distal pulses intact and equal Skin: warm/dry no rash  EKG:  Marked sinus bradycardia 47 beats per minute, otherwise within normal limits.  ASSESSMENT AND PLAN: 1. Coronary atherosclerosis, native vessel. Stable without anginal symptoms. I am going to reduce his metoprolol to 12.5 mg twice daily because of bradycardia. He will otherwise continue on his current medical regimen. I will see him back in 6 months.  2. Essential hypertension. Blood pressure is elevated. I read checked his blood pressure and it was 164/94. Will add lisinopril 5 mg daily. He will return for a metabolic panel in 2 weeks.  3. Hyperlipidemia. Lipids reviewed as above. The patient is on Crestor 10 mg daily. He should have lipids and LFTs checked before his 6 month followup visit.  4. Tobacco abuse. Extensive discussion about the importance of complete tobacco cessation.  Tonny Bollman 10/13/2012 10:37 AM

## 2012-10-13 NOTE — Patient Instructions (Addendum)
Your physician has recommended you make the following change in your medication: DECREASE Metoprolol Tartrate to 25mg  take one-half tablet by mouth twice a day, START Lisinopril 5mg  take one by mouth daily  Your physician recommends that you return for lab work in: 2 WEEKS (BMP)  Your physician wants you to follow-up in: 6 MONTHS with Dr Excell Seltzer.  You will receive a reminder letter in the mail two months in advance. If you don't receive a letter, please call our office to schedule the follow-up appointment.  Your physician recommends that you return for a FASTING LIPID and LIVER in 6 MONTHS--nothing to eat or drink after midnight.

## 2012-10-26 ENCOUNTER — Telehealth: Payer: Self-pay | Admitting: Cardiovascular Disease

## 2012-10-26 NOTE — Telephone Encounter (Signed)
Spoke to patient and his wife regarding the use of Metoprolol. Verified that patient has diagnosis of Essential Hypertension. Provided education regarding use of Metoprolol, other indications/benefits, and concerns regarding medication. Patient and his wife verbalized understanding and appreciation of information. Stated they asked because their insurance representative was asking them about his diagnoses and medications.

## 2012-10-26 NOTE — Telephone Encounter (Signed)
New Problem     Pt's needs a call back about  Metoprolo  25 mg  and what he is taking it for .   For Ins renewal.   Thanks!

## 2012-10-28 ENCOUNTER — Other Ambulatory Visit (INDEPENDENT_AMBULATORY_CARE_PROVIDER_SITE_OTHER): Payer: Medicare PPO

## 2012-10-28 DIAGNOSIS — I2510 Atherosclerotic heart disease of native coronary artery without angina pectoris: Secondary | ICD-10-CM

## 2012-10-28 DIAGNOSIS — E785 Hyperlipidemia, unspecified: Secondary | ICD-10-CM

## 2012-10-28 LAB — HEPATIC FUNCTION PANEL
AST: 37 U/L (ref 0–37)
Albumin: 4.1 g/dL (ref 3.5–5.2)
Bilirubin, Direct: 0.1 mg/dL (ref 0.0–0.3)
Total Bilirubin: 0.4 mg/dL (ref 0.3–1.2)
Total Protein: 7.6 g/dL (ref 6.0–8.3)

## 2012-10-28 LAB — BASIC METABOLIC PANEL
BUN: 19 mg/dL (ref 6–23)
CO2: 24 mEq/L (ref 19–32)
Chloride: 106 mEq/L (ref 96–112)
Creatinine, Ser: 1.3 mg/dL (ref 0.4–1.5)
Potassium: 4 mEq/L (ref 3.5–5.1)
Sodium: 139 mEq/L (ref 135–145)

## 2012-10-28 LAB — LIPID PANEL
Cholesterol: 124 mg/dL (ref 0–200)
HDL: 25.3 mg/dL — ABNORMAL LOW (ref >39.00)
Total CHOL/HDL Ratio: 5
Triglycerides: 223 mg/dL — ABNORMAL HIGH (ref 0.0–149.0)
VLDL: 44.6 mg/dL — ABNORMAL HIGH (ref 0.0–40.0)

## 2012-10-28 LAB — LDL CHOLESTEROL, DIRECT: Direct LDL: 55.2 mg/dL

## 2012-11-05 ENCOUNTER — Telehealth: Payer: Self-pay | Admitting: Nurse Practitioner

## 2012-11-05 NOTE — Telephone Encounter (Signed)
I called patient to review lab results and plan of care.  Patient asked me to let Dr. Excell Seltzer know that since starting the Lisinopril, his heart rate is running 67-72 BPM.

## 2012-11-05 NOTE — Telephone Encounter (Signed)
Message copied by Levi Aland on Thu Nov 05, 2012 10:56 AM ------      Message from: Tonny Bollman      Created: Tue Nov 03, 2012  5:27 PM       LDL at goal. Triglycerides elevated. Recommend lifestyle modification. ------

## 2013-02-19 ENCOUNTER — Telehealth: Payer: Self-pay | Admitting: Cardiovascular Disease

## 2013-02-19 NOTE — Telephone Encounter (Signed)
New message  Patient is taking metoporol, crestor and lisinopril. His orthopedic doctor would like for him to take celebrex for neck pain. He wants to make sure it is okay to take with other heart meds.

## 2013-02-19 NOTE — Telephone Encounter (Signed)
This is ok

## 2013-02-19 NOTE — Telephone Encounter (Signed)
Left message on personal VM that Dr. Burt Knack said okay to take Celebrex and advised patient to call office with further questions or concerns.

## 2013-02-26 MED ORDER — CELECOXIB 200 MG PO CAPS: 200.0000 mg | ORAL_CAPSULE | Freq: Every day | ORAL | Status: DC

## 2013-02-26 NOTE — Telephone Encounter (Signed)
Advised patient

## 2013-02-26 NOTE — Telephone Encounter (Signed)
Patient has no PCP and wanted to know if Dr Burt Knack would Rx since he is really the only doctor he sees. Will forward to [place] and Dr Burt Knack for review. Wife is aware Dr Burt Knack is not in the office today

## 2013-02-26 NOTE — Telephone Encounter (Signed)
Ok to call in celebrex 200 mg daily, #30 with 1 refill. I would prefer him to get from orthopedist moving forward but that will give him a few months to get in for a visit.

## 2013-02-26 NOTE — Telephone Encounter (Signed)
Follow up    Wife want to know if Dr Burt Knack will call in celebrex.  Orthopedic cannot presc medication because it has been over 74mo since they saw him.  They said go to PCP.  Pt has not seen PCP in years.  If yes, pls call in to [facility]

## 2013-04-30 ENCOUNTER — Ambulatory Visit: Payer: Medicare HMO | Admitting: Cardiovascular Disease

## 2013-04-30 ENCOUNTER — Other Ambulatory Visit: Payer: Medicare HMO

## 2013-05-11 ENCOUNTER — Other Ambulatory Visit: Payer: Self-pay | Admitting: Cardiovascular Disease

## 2013-05-25 ENCOUNTER — Encounter: Payer: Self-pay | Admitting: Nurse Practitioner

## 2013-05-25 ENCOUNTER — Other Ambulatory Visit: Payer: Medicare Other

## 2013-05-25 ENCOUNTER — Ambulatory Visit (INDEPENDENT_AMBULATORY_CARE_PROVIDER_SITE_OTHER): Payer: Medicare Other | Admitting: Nurse Practitioner

## 2013-05-25 VITALS — BP 140/82 | HR 56 | Ht 70.0 in | Wt 201.8 lb

## 2013-05-25 DIAGNOSIS — I259 Chronic ischemic heart disease, unspecified: Secondary | ICD-10-CM

## 2013-05-25 DIAGNOSIS — I10 Essential (primary) hypertension: Secondary | ICD-10-CM

## 2013-05-25 DIAGNOSIS — E785 Hyperlipidemia, unspecified: Secondary | ICD-10-CM

## 2013-05-25 LAB — BASIC METABOLIC PANEL
BUN: 17 mg/dL (ref 6–23)
CO2: 26 mEq/L (ref 19–32)
Calcium: 9.7 mg/dL (ref 8.4–10.5)
Chloride: 105 mEq/L (ref 96–112)
Creatinine, Ser: 1.4 mg/dL (ref 0.4–1.5)
GFR: 55.65 mL/min — ABNORMAL LOW (ref >60.00)
Glucose, Bld: 99 mg/dL (ref 70–99)
Potassium: 4.4 mEq/L (ref 3.5–5.1)
Sodium: 138 mEq/L (ref 135–145)

## 2013-05-25 LAB — HEPATIC FUNCTION PANEL
ALT: 24 U/L (ref 0–53)
AST: 25 U/L (ref 0–37)
Albumin: 4.1 g/dL (ref 3.5–5.2)
Alkaline Phosphatase: 65 U/L (ref 39–117)
Bilirubin, Direct: 0.1 mg/dL (ref 0.0–0.3)
Total Bilirubin: 0.8 mg/dL (ref 0.2–1.2)
Total Protein: 7.5 g/dL (ref 6.0–8.3)

## 2013-05-25 LAB — LIPID PANEL
Cholesterol: 81 mg/dL (ref 0–200)
HDL: 24.4 mg/dL — ABNORMAL LOW (ref >39.00)
LDL Cholesterol: 38 mg/dL (ref 0–99)
Total CHOL/HDL Ratio: 3
Triglycerides: 94 mg/dL (ref 0.0–149.0)
VLDL: 18.8 mg/dL (ref 0.0–40.0)

## 2013-05-25 MED ORDER — ROSUVASTATIN CALCIUM 10 MG PO TABS: ORAL_TABLET | ORAL | Status: DC

## 2013-05-25 NOTE — Patient Instructions (Signed)
Stay on your current medicines  I have refilled the Crestor today  We will check labs today  We will arrange for a stress test ([place])  Monitor your blood pressure at home - call us if not consistently below 135/85  See Dr. Burt Knack in 6 months  Call the [place] office at [phone] if you have any questions, problems or concerns.

## 2013-05-25 NOTE — Progress Notes (Signed)
[name] Date of Birth: Nov 27, 1945 Medical Record #[id number]  History of Present Illness: [name] is seen back today for a follow up visit. Seen for Dr. Burt Knack - this is an 8 month check. He has CAD with past inferior MI in 2010 treated with PCI to the RCA, moderate distal left main and moderate LCX disease - treated medically. A follow up Myoview was negative for significant ischemia.   Other issues include HLD,OA, and HTN.  Last seen here in September of 2014 - felt to be doing ok.  Comes back today. Here alone. Doing ok. Notes some decrease in his energy level - not to the degree that he had prior to his MI 5 years ago. No chest pain. Not short of breath. BP good at home. Limited by his back but tries to walk 1 to 2 times a week - has a pool at his house. Needs labs checked today.    Current Outpatient Prescriptions  Medication Sig Dispense Refill  . aspirin 81 MG tablet Take 81 mg by mouth daily.        . celecoxib (CELEBREX) 200 MG capsule Take 1 capsule (200 mg total) by mouth daily.  30 capsule  1  . CRESTOR 10 MG tablet TAKE ONE TABLET BY MOUTH ONCE DAILY  90 tablet  0  . fish oil-omega-3 fatty acids 1000 MG capsule Take 2 g by mouth daily.        Marland Kitchen lisinopril (PRINIVIL,ZESTRIL) 5 MG tablet Take 1 tablet (5 mg total) by mouth daily.  90 tablet  3  . metoprolol tartrate (LOPRESSOR) 25 MG tablet Take 0.5 tablets (12.5 mg total) by mouth 2 (two) times daily.  90 tablet  3  . nitroGLYCERIN (NITROSTAT) 0.4 MG SL tablet Place 1 tablet (0.4 mg total) under the tongue every 5 (five) minutes as needed.  25 tablet  1   No current facility-administered medications for this visit.    No Known Allergies  Past Medical History  Diagnosis Date  . Coronary atherosclerosis of native coronary artery     inferior wall MI 2010  . Essential hypertension, benign   . Pure hypercholesterolemia     History reviewed. No pertinent past surgical history.  History  Smoking status  .  Former Smoker  . Types: Cigarettes  . Quit date: 07/25/2010  Smokeless tobacco  . Never Used    History  Alcohol Use: Not on file    History reviewed. No pertinent family history.  Review of Systems: The review of systems is per the HPI.  All other systems were reviewed and are negative.  Physical Exam: BP 140/82  Pulse 56  Ht 5\' 10"  (1.778 m)  Wt 201 lb 12.8 oz (91.536 kg)  BMI 28.96 kg/m2 Patient is very pleasant and in no acute distress. Skin is warm and dry. Color is normal.  Suntanned. HEENT is unremarkable. Normocephalic/atraumatic. PERRL. Sclera are nonicteric. Neck is supple. No masses. No JVD. Lungs are clear. Cardiac exam shows a regular rate and rhythm. Abdomen is soft. Extremities are without edema. Gait and ROM are intact. No gross neurologic deficits noted.  Wt Readings from Last 3 Encounters:  05/25/13 201 lb 12.8 oz (91.536 kg)  10/13/12 211 lb (95.709 kg)  03/24/12 205 lb 6.4 oz (93.169 kg)     LABORATORY DATA: PENDING  Lab Results  Component Value Date   WBC 9.7 10/22/2008   HGB 12.7* 10/22/2008   HCT 36.6* 10/22/2008   PLT 183 10/22/2008  GLUCOSE 104* 10/28/2012   CHOL 124 10/28/2012   TRIG 223.0* 10/28/2012   HDL 25.30* 10/28/2012   LDLDIRECT 55.2 10/28/2012   LDLCALC 47 04/07/2012   ALT 46 10/28/2012   AST 37 10/28/2012   NA 139 10/28/2012   K 4.0 10/28/2012   CL 106 10/28/2012   CREATININE 1.3 10/28/2012   BUN 19 10/28/2012   CO2 24 10/28/2012   TSH 1.824 Test methodology is 3rd generation TSH 10/20/2008   INR 1.66* 10/20/2008    Assessment / Plan: 1. CAD - prior MI with PCI of the RCA - with moderate distal left main and moderate LCX residual disease - some decrease in energy - no chest pain/discomfort - will arrange for Lexiscan. Further disposition to follow.  2. HTN - BP up slightly here - has better control at home - I have asked him to continue to monitor.  3. HLD - needs labs today. Crestor refilled today.   Tentatively see back  in 6 months unless his stress test is unsatisfactory.   Patient is agreeable to this plan and will call if any problems develop in the interim.   Burtis Junes, RN, [place] [address] [place], [place]  [phone]

## 2013-05-26 ENCOUNTER — Telehealth: Payer: Self-pay | Admitting: Cardiovascular Disease

## 2013-05-26 NOTE — Telephone Encounter (Signed)
I spoke with the pt and he is complaining of a dry cough since starting Lisinopril (09/2012).  The pt needs a generic alternative due to cost. I will forward this message to Dr Burt Knack for review and further recommendations.

## 2013-05-26 NOTE — Telephone Encounter (Signed)
New problem   Pt need to speak to nurse concerning his medication lisinopril

## 2013-05-27 ENCOUNTER — Other Ambulatory Visit: Payer: Medicare HMO

## 2013-05-27 ENCOUNTER — Ambulatory Visit: Payer: Medicare HMO | Admitting: Cardiovascular Disease

## 2013-05-28 MED ORDER — LOSARTAN POTASSIUM 50 MG PO TABS
50.0000 mg | ORAL_TABLET | Freq: Every day | ORAL | Status: DC
Start: 2013-05-28 — End: 2013-06-05

## 2013-05-28 NOTE — Telephone Encounter (Signed)
Change to losartan 50 mg daily. thx

## 2013-05-28 NOTE — Telephone Encounter (Signed)
Follow up     Waiting to hear from [place] regarding changing bp mediation.  If possible, please call back today

## 2013-05-28 NOTE — Telephone Encounter (Signed)
I spoke with the pt and his wife and made them aware the pt needs to stop lisinopril and start losartan 50mg  daily.  Rx sent to pharmacy for losartan.

## 2013-06-04 ENCOUNTER — Telehealth: Payer: Self-pay | Admitting: Cardiovascular Disease

## 2013-06-04 NOTE — Telephone Encounter (Signed)
New message    Wife calling    On day 5 took new medication - losartan 50 mg   C/O stomach pain , went to dinner came home starting throwing up . Face / eye were swollen.  Gave 2 benadryl .   Blood pressure 140/80 taken last night. Pulse rate was 60. [name]

## 2013-06-04 NOTE — Telephone Encounter (Signed)
Follow up     Just talked to a nurse and want her to know this. Stomach has been bothering him off/on all week.  He started losartan Sunday.

## 2013-06-04 NOTE — Telephone Encounter (Signed)
Wife calling stating Mr. Dolney was started on Losartan 50 mg 5 days ago. Medication was changed to Losartan from Lisinopril due to a cough.  Last night 5 days after starting medication his face and eyes were swollen and vomited violently due to stomach ache.  They gave him 2 benadryl. This morning eyes and face are not as swollen.  BP has been 142/82 this morning and about the same this past week.  States he has had discomfort in his stomach all week.  Spoke with Dr. Aundra Dubin (DOD) and he advised to stop Losartan and to let Dr. Burt Knack decide if needs to be on another BP medication.  Advised wife to monitor BP over weekend and if becomes elevated to call [facility] on call since our office will be closed on Monday-[facility] Day.  Will forward to Dr. Burt Knack and his nurse Weyman Rodney to address on Tuesday.

## 2013-06-05 ENCOUNTER — Encounter (HOSPITAL_COMMUNITY): Payer: Self-pay | Admitting: Emergency Medicine

## 2013-06-05 ENCOUNTER — Emergency Department (HOSPITAL_COMMUNITY)
Admission: EM | Admit: 2013-06-05 | Discharge: 2013-06-05 | Disposition: A | Discharge status: Home / Self Care | Payer: Medicare Other | Attending: Emergency Medicine | Admitting: Emergency Medicine

## 2013-06-05 ENCOUNTER — Emergency Department (HOSPITAL_COMMUNITY): Payer: Medicare Other

## 2013-06-05 ENCOUNTER — Inpatient Hospital Stay (HOSPITAL_COMMUNITY)
Admission: EM | Admit: 2013-06-05 | Discharge: 2013-06-12 | DRG: 392 | Disposition: A | Discharge status: Home / Self Care | Payer: Medicare Other | Attending: Oncology | Admitting: Oncology

## 2013-06-05 DIAGNOSIS — K5732 Diverticulitis of large intestine without perforation or abscess without bleeding: Secondary | ICD-10-CM | POA: Diagnosis present

## 2013-06-05 DIAGNOSIS — I714 Abdominal aortic aneurysm, without rupture, unspecified: Secondary | ICD-10-CM | POA: Diagnosis present

## 2013-06-05 DIAGNOSIS — I10 Essential (primary) hypertension: Secondary | ICD-10-CM | POA: Insufficient documentation

## 2013-06-05 DIAGNOSIS — N179 Acute kidney failure, unspecified: Secondary | ICD-10-CM | POA: Diagnosis present

## 2013-06-05 DIAGNOSIS — R112 Nausea with vomiting, unspecified: Secondary | ICD-10-CM

## 2013-06-05 DIAGNOSIS — Z9861 Coronary angioplasty status: Secondary | ICD-10-CM

## 2013-06-05 DIAGNOSIS — J4489 Other specified chronic obstructive pulmonary disease: Secondary | ICD-10-CM | POA: Diagnosis present

## 2013-06-05 DIAGNOSIS — F17200 Nicotine dependence, unspecified, uncomplicated: Secondary | ICD-10-CM | POA: Diagnosis present

## 2013-06-05 DIAGNOSIS — R220 Localized swelling, mass and lump, head: Secondary | ICD-10-CM | POA: Insufficient documentation

## 2013-06-05 DIAGNOSIS — K5792 Diverticulitis of intestine, part unspecified, without perforation or abscess without bleeding: Secondary | ICD-10-CM

## 2013-06-05 DIAGNOSIS — E876 Hypokalemia: Secondary | ICD-10-CM | POA: Diagnosis not present

## 2013-06-05 DIAGNOSIS — R21 Rash and other nonspecific skin eruption: Secondary | ICD-10-CM | POA: Insufficient documentation

## 2013-06-05 DIAGNOSIS — J449 Chronic obstructive pulmonary disease, unspecified: Secondary | ICD-10-CM | POA: Diagnosis present

## 2013-06-05 DIAGNOSIS — R221 Localized swelling, mass and lump, neck: Secondary | ICD-10-CM

## 2013-06-05 DIAGNOSIS — D696 Thrombocytopenia, unspecified: Secondary | ICD-10-CM | POA: Diagnosis present

## 2013-06-05 DIAGNOSIS — Y9229 Other specified public building as the place of occurrence of the external cause: Secondary | ICD-10-CM | POA: Insufficient documentation

## 2013-06-05 DIAGNOSIS — I498 Other specified cardiac arrhythmias: Secondary | ICD-10-CM | POA: Diagnosis present

## 2013-06-05 DIAGNOSIS — Z79899 Other long term (current) drug therapy: Secondary | ICD-10-CM | POA: Diagnosis not present

## 2013-06-05 DIAGNOSIS — IMO0002 Reserved for concepts with insufficient information to code with codable children: Secondary | ICD-10-CM | POA: Diagnosis not present

## 2013-06-05 DIAGNOSIS — I252 Old myocardial infarction: Secondary | ICD-10-CM | POA: Diagnosis not present

## 2013-06-05 DIAGNOSIS — T80212A Local infection due to central venous catheter, initial encounter: Secondary | ICD-10-CM | POA: Diagnosis not present

## 2013-06-05 DIAGNOSIS — Y9210 Unspecified residential institution as the place of occurrence of the external cause: Secondary | ICD-10-CM | POA: Diagnosis not present

## 2013-06-05 DIAGNOSIS — Z7982 Long term (current) use of aspirin: Secondary | ICD-10-CM | POA: Diagnosis not present

## 2013-06-05 DIAGNOSIS — D649 Anemia, unspecified: Secondary | ICD-10-CM | POA: Diagnosis present

## 2013-06-05 DIAGNOSIS — Z791 Long term (current) use of non-steroidal anti-inflammatories (NSAID): Secondary | ICD-10-CM | POA: Insufficient documentation

## 2013-06-05 DIAGNOSIS — Y849 Medical procedure, unspecified as the cause of abnormal reaction of the patient, or of later complication, without mention of misadventure at the time of the procedure: Secondary | ICD-10-CM | POA: Diagnosis not present

## 2013-06-05 DIAGNOSIS — E7800 Pure hypercholesterolemia, unspecified: Secondary | ICD-10-CM | POA: Insufficient documentation

## 2013-06-05 DIAGNOSIS — T502X5A Adverse effect of carbonic-anhydrase inhibitors, benzothiadiazides and other diuretics, initial encounter: Secondary | ICD-10-CM | POA: Diagnosis present

## 2013-06-05 DIAGNOSIS — R1115 Cyclical vomiting syndrome unrelated to migraine: Secondary | ICD-10-CM | POA: Diagnosis present

## 2013-06-05 DIAGNOSIS — Z87891 Personal history of nicotine dependence: Secondary | ICD-10-CM | POA: Insufficient documentation

## 2013-06-05 DIAGNOSIS — E785 Hyperlipidemia, unspecified: Secondary | ICD-10-CM | POA: Diagnosis present

## 2013-06-05 DIAGNOSIS — I2510 Atherosclerotic heart disease of native coronary artery without angina pectoris: Secondary | ICD-10-CM | POA: Diagnosis present

## 2013-06-05 DIAGNOSIS — Y9389 Activity, other specified: Secondary | ICD-10-CM | POA: Insufficient documentation

## 2013-06-05 DIAGNOSIS — N289 Disorder of kidney and ureter, unspecified: Secondary | ICD-10-CM | POA: Insufficient documentation

## 2013-06-05 DIAGNOSIS — R062 Wheezing: Secondary | ICD-10-CM | POA: Insufficient documentation

## 2013-06-05 DIAGNOSIS — L0390 Cellulitis, unspecified: Secondary | ICD-10-CM | POA: Diagnosis not present

## 2013-06-05 DIAGNOSIS — T7840XA Allergy, unspecified, initial encounter: Secondary | ICD-10-CM

## 2013-06-05 HISTORY — DX: Unspecified osteoarthritis, unspecified site: M19.90

## 2013-06-05 HISTORY — DX: Abdominal aortic aneurysm, without rupture: I71.4

## 2013-06-05 HISTORY — DX: Anemia, unspecified: D64.9

## 2013-06-05 HISTORY — DX: Diverticulitis of intestine, part unspecified, without perforation or abscess without bleeding: K57.92

## 2013-06-05 HISTORY — DX: Abdominal aortic aneurysm, without rupture, unspecified: I71.40

## 2013-06-05 HISTORY — DX: Cellulitis, unspecified: L03.90

## 2013-06-05 HISTORY — DX: Tobacco use: Z72.0

## 2013-06-05 LAB — COMPREHENSIVE METABOLIC PANEL
ALK PHOS: 83 U/L (ref 39–117)
ALT: 31 U/L (ref 0–53)
AST: 33 U/L (ref 0–37)
Albumin: 4.4 g/dL (ref 3.5–5.2)
BUN: 20 mg/dL (ref 6–23)
CALCIUM: 9.8 mg/dL (ref 8.4–10.5)
CO2: 26 mEq/L (ref 19–32)
CREATININE: 1.41 mg/dL — AB (ref 0.50–1.35)
Chloride: 99 mEq/L (ref 96–112)
GFR calc non Af Amer: 50 mL/min — ABNORMAL LOW (ref >90)
GFR, EST AFRICAN AMERICAN: 58 mL/min — AB (ref >90)
Glucose, Bld: 111 mg/dL — ABNORMAL HIGH (ref 70–99)
POTASSIUM: 3.8 meq/L (ref 3.7–5.3)
Sodium: 138 mEq/L (ref 137–147)
TOTAL PROTEIN: 8 g/dL (ref 6.0–8.3)
Total Bilirubin: 0.5 mg/dL (ref 0.3–1.2)

## 2013-06-05 LAB — URINALYSIS, ROUTINE W REFLEX MICROSCOPIC
Bilirubin Urine: NEGATIVE
Bilirubin Urine: NEGATIVE
Glucose, UA: NEGATIVE mg/dL
Glucose, UA: NEGATIVE mg/dL
Hgb urine dipstick: NEGATIVE
Hgb urine dipstick: NEGATIVE
KETONES UR: NEGATIVE mg/dL
Ketones, ur: NEGATIVE mg/dL
LEUKOCYTES UA: NEGATIVE
Leukocytes, UA: NEGATIVE
NITRITE: NEGATIVE
NITRITE: NEGATIVE
PH: 7 (ref 5.0–8.0)
PROTEIN: NEGATIVE mg/dL
Protein, ur: NEGATIVE mg/dL
SPECIFIC GRAVITY, URINE: 1.011 (ref 1.005–1.030)
Specific Gravity, Urine: 1.013 (ref 1.005–1.030)
UROBILINOGEN UA: 0.2 mg/dL (ref 0.0–1.0)
Urobilinogen, UA: 0.2 mg/dL (ref 0.0–1.0)
pH: 6.5 (ref 5.0–8.0)

## 2013-06-05 LAB — BASIC METABOLIC PANEL
BUN: 18 mg/dL (ref 6–23)
CALCIUM: 9.4 mg/dL (ref 8.4–10.5)
CO2: 27 mEq/L (ref 19–32)
CREATININE: 1.38 mg/dL — AB (ref 0.50–1.35)
Chloride: 104 mEq/L (ref 96–112)
GFR calc Af Amer: 60 mL/min — ABNORMAL LOW (ref >90)
GFR calc non Af Amer: 52 mL/min — ABNORMAL LOW (ref >90)
Glucose, Bld: 104 mg/dL — ABNORMAL HIGH (ref 70–99)
Potassium: 4.2 mEq/L (ref 3.7–5.3)
Sodium: 143 mEq/L (ref 137–147)

## 2013-06-05 LAB — CBC WITH DIFFERENTIAL/PLATELET
BASOS PCT: 1 % (ref 0–1)
Basophils Absolute: 0.1 10*3/uL (ref 0.0–0.1)
EOS PCT: 1 % (ref 0–5)
Eosinophils Absolute: 0.1 10*3/uL (ref 0.0–0.7)
HCT: 30.6 % — ABNORMAL LOW (ref 39.0–52.0)
Hemoglobin: 10.2 g/dL — ABNORMAL LOW (ref 13.0–17.0)
Lymphocytes Relative: 13 % (ref 12–46)
Lymphs Abs: 1 10*3/uL (ref 0.7–4.0)
MCH: 30 pg (ref 26.0–34.0)
MCHC: 33.3 g/dL (ref 30.0–36.0)
MCV: 90 fL (ref 78.0–100.0)
MONOS PCT: 17 % — AB (ref 3–12)
Monocytes Absolute: 1.3 10*3/uL — ABNORMAL HIGH (ref 0.1–1.0)
NEUTROS PCT: 69 % (ref 43–77)
Neutro Abs: 5.3 10*3/uL (ref 1.7–7.7)
Platelets: 129 10*3/uL — ABNORMAL LOW (ref 150–400)
RBC: 3.4 MIL/uL — ABNORMAL LOW (ref 4.22–5.81)
RDW: 20.2 % — ABNORMAL HIGH (ref 11.5–15.5)
WBC: 7.6 10*3/uL (ref 4.0–10.5)

## 2013-06-05 LAB — RAPID URINE DRUG SCREEN, HOSP PERFORMED
AMPHETAMINES: NOT DETECTED
BARBITURATES: NOT DETECTED
BENZODIAZEPINES: NOT DETECTED
Cocaine: NOT DETECTED
Opiates: NOT DETECTED
Tetrahydrocannabinol: NOT DETECTED

## 2013-06-05 LAB — I-STAT TROPONIN, ED: TROPONIN I, POC: 0 ng/mL (ref 0.00–0.08)

## 2013-06-05 LAB — LIPASE, BLOOD: LIPASE: 51 U/L (ref 11–59)

## 2013-06-05 LAB — TSH: TSH: 1.55 u[IU]/mL (ref 0.350–4.500)

## 2013-06-05 MED ORDER — ONDANSETRON HCL 4 MG/2ML IJ SOLN
4.0000 mg | Freq: Once | INTRAMUSCULAR | Status: AC
  Administered 2013-06-05: 4 mg via INTRAVENOUS
  Filled 2013-06-05: qty 2

## 2013-06-05 MED ORDER — PREDNISONE 20 MG PO TABS: 40.0000 mg | ORAL_TABLET | Freq: Every day | ORAL | Status: DC

## 2013-06-05 MED ORDER — ONDANSETRON HCL 4 MG PO TABS: 4.0000 mg | ORAL_TABLET | Freq: Four times a day (QID) | ORAL | Status: DC | PRN

## 2013-06-05 MED ORDER — ONDANSETRON 4 MG PO TBDP: 4.0000 mg | ORAL_TABLET | Freq: Three times a day (TID) | ORAL | Status: DC | PRN

## 2013-06-05 MED ORDER — SCOPOLAMINE 1 MG/3DAYS TD PT72
1.0000 | MEDICATED_PATCH | TRANSDERMAL | Status: DC
  Administered 2013-06-05: 1.5 mg via TRANSDERMAL
  Filled 2013-06-05: qty 1

## 2013-06-05 MED ORDER — ONDANSETRON HCL 4 MG/2ML IJ SOLN
4.0000 mg | Freq: Four times a day (QID) | INTRAMUSCULAR | Status: DC | PRN
  Administered 2013-06-05 – 2013-06-06 (×4): 4 mg via INTRAVENOUS
  Filled 2013-06-05 (×4): qty 2

## 2013-06-05 MED ORDER — PROMETHAZINE HCL 25 MG/ML IJ SOLN: 12.5000 mg | Freq: Four times a day (QID) | INTRAMUSCULAR | Status: DC | PRN

## 2013-06-05 MED ORDER — FAMOTIDINE IN NACL 20-0.9 MG/50ML-% IV SOLN
20.0000 mg | Freq: Once | INTRAVENOUS | Status: AC
  Administered 2013-06-05: 20 mg via INTRAVENOUS
  Filled 2013-06-05: qty 50

## 2013-06-05 MED ORDER — SODIUM CHLORIDE 0.9 % IJ SOLN
3.0000 mL | Freq: Two times a day (BID) | INTRAMUSCULAR | Status: DC
  Administered 2013-06-06 – 2013-06-11 (×5): 3 mL via INTRAVENOUS

## 2013-06-05 MED ORDER — SODIUM CHLORIDE 0.9 % IV BOLUS (SEPSIS)
1000.0000 mL | Freq: Once | INTRAVENOUS | Status: AC
  Administered 2013-06-05: 1000 mL via INTRAVENOUS

## 2013-06-05 MED ORDER — ENOXAPARIN SODIUM 40 MG/0.4ML ~~LOC~~ SOLN
40.0000 mg | SUBCUTANEOUS | Status: DC
  Administered 2013-06-05 – 2013-06-11 (×7): 40 mg via SUBCUTANEOUS
  Filled 2013-06-05 (×8): qty 0.4

## 2013-06-05 MED ORDER — CIPROFLOXACIN IN D5W 400 MG/200ML IV SOLN
400.0000 mg | Freq: Once | INTRAVENOUS | Status: AC
  Administered 2013-06-06: 400 mg via INTRAVENOUS
  Filled 2013-06-05: qty 200

## 2013-06-05 MED ORDER — SODIUM CHLORIDE 0.9 % IV SOLN
INTRAVENOUS | Status: DC
  Administered 2013-06-05 – 2013-06-09 (×4): via INTRAVENOUS

## 2013-06-05 MED ORDER — METRONIDAZOLE IN NACL 5-0.79 MG/ML-% IV SOLN
500.0000 mg | Freq: Once | INTRAVENOUS | Status: AC
  Administered 2013-06-05: 500 mg via INTRAVENOUS
  Filled 2013-06-05: qty 100

## 2013-06-05 MED ORDER — PANTOPRAZOLE SODIUM 40 MG IV SOLR
40.0000 mg | Freq: Two times a day (BID) | INTRAVENOUS | Status: DC
  Administered 2013-06-05 – 2013-06-11 (×12): 40 mg via INTRAVENOUS
  Filled 2013-06-05 (×13): qty 40

## 2013-06-05 MED ORDER — CIPROFLOXACIN IN D5W 400 MG/200ML IV SOLN
400.0000 mg | Freq: Once | INTRAVENOUS | Status: AC
  Administered 2013-06-05: 400 mg via INTRAVENOUS
  Filled 2013-06-05: qty 200

## 2013-06-05 MED ORDER — PROMETHAZINE HCL 25 MG PO TABS: 25.0000 mg | ORAL_TABLET | Freq: Four times a day (QID) | ORAL | Status: DC | PRN

## 2013-06-05 MED ORDER — METOCLOPRAMIDE HCL 5 MG/ML IJ SOLN
10.0000 mg | Freq: Once | INTRAMUSCULAR | Status: AC
  Administered 2013-06-05: 10 mg via INTRAVENOUS
  Filled 2013-06-05: qty 2

## 2013-06-05 MED ORDER — GLYCOPYRROLATE 0.2 MG/ML IJ SOLN
0.1000 mg | Freq: Once | INTRAMUSCULAR | Status: AC
  Administered 2013-06-05: 0.1 mg via INTRAVENOUS
  Filled 2013-06-05: qty 1

## 2013-06-05 MED ORDER — NICOTINE 14 MG/24HR TD PT24
14.0000 mg | MEDICATED_PATCH | Freq: Every day | TRANSDERMAL | Status: DC
  Administered 2013-06-05 – 2013-06-12 (×8): 14 mg via TRANSDERMAL
  Filled 2013-06-05 (×8): qty 1

## 2013-06-05 MED ORDER — PROMETHAZINE HCL 25 MG/ML IJ SOLN
12.5000 mg | Freq: Four times a day (QID) | INTRAMUSCULAR | Status: DC | PRN
  Administered 2013-06-05 – 2013-06-06 (×2): 12.5 mg via INTRAVENOUS
  Filled 2013-06-05 (×2): qty 1

## 2013-06-05 MED ORDER — MORPHINE SULFATE 2 MG/ML IJ SOLN
1.0000 mg | INTRAMUSCULAR | Status: DC | PRN
  Administered 2013-06-06 – 2013-06-07 (×7): 1 mg via INTRAVENOUS
  Filled 2013-06-05 (×7): qty 1

## 2013-06-05 MED ORDER — ONDANSETRON 8 MG/NS 50 ML IVPB
8.0000 mg | Freq: Once | INTRAVENOUS | Status: AC
  Administered 2013-06-05: 8 mg via INTRAVENOUS
  Filled 2013-06-05: qty 8

## 2013-06-05 NOTE — ED Notes (Signed)
The family has gone.  Admitting  Physicians at the bedside

## 2013-06-05 NOTE — ED Notes (Signed)
Pt.s abdominal pain is coming back.  Reported this to Dr. Jeneen Rinks

## 2013-06-05 NOTE — ED Notes (Signed)
ABdominal pain , cramping, n/v symptoms began Thursday.  Pt. Was discharged at 0700 am today for the same symptoms.  Pt. Reports that he feels his abdomen is bloated. Skin is p/w/d. Resp. E/u

## 2013-06-05 NOTE — ED Notes (Signed)
DR. Sabra Heck and this RN present for team d/c.

## 2013-06-05 NOTE — ED Notes (Signed)
Attempted report the rn thinks there has been a bed changed and will call me back

## 2013-06-05 NOTE — ED Notes (Addendum)
C/o vomiting and facial redness/swelling since Thursday night.  Pt started Losartan on Sunday- last dose Thursday afternoon after calling Dr about symptoms.  Symptoms started 30 min after eating at [employer] ([employer]) Thursday night.  Took 2 Benadryl Thursday evening- wife reports pt's eyes were swollen shut.  Reports stabbing pain to mid abd since Thursday morning.  Frequent urination Thursday night.  Has not taken any Benadryl today.

## 2013-06-05 NOTE — Consult Note (Signed)
Consult Note  Patient name: [name] MRN: [id number] DOB: [birth year] Sex: male  Consulting Physician:  ER  Reason for Consult:  Chief Complaint  Patient presents with  . Abdominal Pain    HISTORY OF PRESENT ILLNESS: This is a 68 yo male who presented to the ER with abdominal pain and nausea earlier this am.  His symptoms were present for approximately 36 hours.  His sx'x began following eating [place] food.  He developed  A rash across his face secondary to violent emesis.  He was diagnosed with an allergic reaction and sent home.  He represented via EMS later today with dry heaving and diffuse abdominal pain.  A non-contrast CT scan was obtained as he had a slight increase in his creatinine.  This revealed mild diverticulitis at the descending colon / sigmoid junction and a 5.8cm AAA.  With medication, his abdominal pain and nausea resolved.  The patient has a history of CAD and is followed by Dr. Burt Knack.  He has a history of an inferior wall MI in 2010, treated with PCI to the RCA.  He ahd moderate distal left main and moderate LCX disease which were treated medically.  A follow up myoview was negative for significant ischemia.  He is scheduled for myoview in July.  He denies active chest pain.  He also describes difficulty with walking at approximately 10 minutes.  He gets pain in his thigh and feels as if his legs are going to give out.  His symptoms are alleviated with rest.  He has attributed this to lower back pain and sciatica.  The patient is a current smoker.  He is medically managed for hypertension with an ACE inhibitor.  He is medically treated for hypercholesterolemia with a statin.  He takes ASA 81mg  as antiplatelet therapy.  He has mild renal insufficiency.  His creatinine in the ER was 1.38 with a GFR of 52.  This could be secondary to dehydration given his recent n/v.  He has been diagnosed with COPD.  He is not on O2.  Past Medical History  Diagnosis Date  .  Coronary atherosclerosis of native coronary artery     inferior wall MI 2010  . Essential hypertension, benign   . Pure hypercholesterolemia     Past Surgical History  Procedure Laterality Date  . Cardiac stents  2010    History   Social History  . Marital Status: Married    Spouse Name: N/A    Number of Children: N/A  . Years of Education: N/A   Occupational History  . Not on file.   Social History Main Topics  . Smoking status: Former Smoker    Types: Cigarettes    Quit date: 07/25/2010  . Smokeless tobacco: Never Used  . Alcohol Use: No  . Drug Use: Not on file  . Sexual Activity: Not on file   Other Topics Concern  . Not on file   Social History Narrative  . No narrative on file    History reviewed. No pertinent family history.  Allergies as of 06/05/2013  . (No Known Allergies)    No current facility-administered medications on file prior to encounter.   Current Outpatient Prescriptions on File Prior to Encounter  Medication Sig Dispense Refill  . aspirin 81 MG tablet Take 81 mg by mouth daily.        . fish oil-omega-3 fatty acids 1000 MG capsule Take 2 g by mouth daily.        Marland Kitchen  metoprolol tartrate (LOPRESSOR) 25 MG tablet Take 0.5 tablets (12.5 mg total) by mouth 2 (two) times daily.  90 tablet  3  . nitroGLYCERIN (NITROSTAT) 0.4 MG SL tablet Place 1 tablet (0.4 mg total) under the tongue every 5 (five) minutes as needed.  25 tablet  1  . ondansetron (ZOFRAN ODT) 4 MG disintegrating tablet Take 1 tablet (4 mg total) by mouth every 8 (eight) hours as needed for nausea.  10 tablet  0  . oxymetazoline (AFRIN) 0.05 % nasal spray Place 2 sprays into both nostrils 2 (two) times daily as needed for congestion.      . predniSONE (DELTASONE) 20 MG tablet Take 2 tablets (40 mg total) by mouth daily.  10 tablet  0  . promethazine (PHENERGAN) 25 MG tablet Take 25 mg by mouth every 6 (six) hours as needed for nausea or vomiting.      . rosuvastatin (CRESTOR) 10 MG  tablet Take 10 mg by mouth daily.         REVIEW OF SYSTEMS: Cardiovascular: No chest pain, chest pressure, palpitations, orthopnea, or dyspnea on exertion.No history of DVT or phlebitis.  See HPI for claudication symptoms Pulmonary: No productive cough, asthma or wheezing. Neurologic: No weakness, paresthesias, aphasia, or amaurosis. No dizziness. Hematologic: No bleeding problems or clotting disorders. Musculoskeletal: No joint pain or joint swelling. Gastrointestinal: abdominal pain, n/v Genitourinary: No dysuria or hematuria. Psychiatric:: No history of major depression. Integumentary: No rashes or ulcers. Constitutional: No fever or chills.  PHYSICAL EXAMINATION: General: The patient appears their stated age.  Vital signs are BP 117/73  Pulse 116  Temp(Src) 98.5 F (36.9 C) (Oral)  Resp 13  Ht 5\' 10"  (1.778 m)  Wt 199 lb (90.266 kg)  BMI 28.55 kg/m2  SpO2 96% Pulmonary: Respirations are non-labored HEENT:  No gross abnormalities Abdomen: no gross tenderness, however with deep palpation, I can feel his AAA, and he does endorse pain with this maneuver Musculoskeletal: There are no major deformities.   Neurologic: No focal weakness or paresthesias are detected, Skin: There are no ulcer or rashes noted. Psychiatric: The patient has normal affect. Cardiovascular: There is a regular rate and rhythm without significant murmur appreciated.  Palpable femoral pulses, R>L.  Pedal pulses are not palpable  Diagnostic Studies: I have reviewed his non-contrast CT a/p.  This shows mild diverticulitis and a 5.8 cm infrarenal AAA   Assessment:  Abdominal pain. Plan: I spent 60 minutes with the family and reviewing his imaging studies.  I do not think his pain is from his AAA, but there is no was to be 100% sure.  With his n/v I suspect he is having pain from his diverticulitis.  Because of the large size of his AAA, coupled with his pain, I am not comfortable with him going home.  I  think he needs to be admitted for IV abx to try and cool off his diverticulitis.  He will likely need repair of his AAA prior to d/c.  He will need a CTA of the chest, abdomen, and pelvis to better define his anatomy to determine if he is a candidate for EVAR.  On his non-contrast CT scan he has calcification at his aortic bifurcation.  This could account for his claudication like symptoms.  He will need to be hydrated before he gets contrast to avoid worsening of his renal disease.  I will likely order this on Monday.  I discussed the details of endovascular repair of his AAA.  We  discussed the potential risks including but not limited to bleeding, cardio-pulmonary complications, bowel ischemia, lower extremity ischemia, and death.  He is agreeable to inpatient adminssion.  I discussed the importance of smoking cessation.  Prior to surgery, I will ask cardiology to evaluate him. I will also order carotid doppler studies and ABI/lower extremity duplex.  He understands that if his symptoms get worse, I may have to repair his AAA more emergently.     Eldridge Abrahams, M.D. [facility] Office: [phone] Pager:  [phone]

## 2013-06-05 NOTE — ED Provider Notes (Signed)
To discharge the patient this walk to the bathroom at which time he had recurrent mild to moderate epigastric discomfort associated with nausea but no vomiting. He ambulated back to his room, I took his blood pressure in both the sitting and standing position, no significant changes, no change in pulse, abdomen reexamined and is nontender, non-tympanitic to percussion. Reviewed the patient's labs with him and offered him CT scan but patient declines at this time and states he would rather go home and followup if his symptoms worsen. Nausea medication prescriptions given, patient expresses understanding to the indications for return.  Johnna Acosta, MD 06/05/13 [phone]

## 2013-06-05 NOTE — ED Provider Notes (Addendum)
CSN: [id number]     Arrival date & time 06/05/13  1150 History   First MD Initiated Contact with Patient 06/05/13 1154     Chief Complaint  Patient presents with  . Abdominal Pain      HPI  Pt returns with  "dry heaving" at home.  Seen here after midnight/early this morning with N/V/AP. Symptoms had started about 2 days ago in the evening.  Had eaten [place] food, but his wife had eaten same dish and is asymptomatic.  Given IVF, anti emetics, and was able to be DC'c home.  Pt got home, had recurrence of nausea and dry-heaves.  Emesis to point of facial petechiae. Describes AP as diffuse, intermittent/crampy.  No back or flank pain.  No hematemesis. No syncope, no near syncope.  History of MI, Mild renal insufficiency. Prior smoker, quit 2012.    Past Medical History  Diagnosis Date  . Coronary atherosclerosis of native coronary artery     inferior wall MI 2010  . Essential hypertension, benign   . Pure hypercholesterolemia    Past Surgical History  Procedure Laterality Date  . Cardiac stents  2010   History reviewed. No pertinent family history. History  Substance Use Topics  . Smoking status: Former Smoker    Types: Cigarettes    Quit date: 07/25/2010  . Smokeless tobacco: Never Used  . Alcohol Use: No    Review of Systems  Constitutional: Negative for fever, chills, diaphoresis, appetite change and fatigue.  HENT: Negative for mouth sores, sore throat and trouble swallowing.   Eyes: Negative for visual disturbance.  Respiratory: Negative for cough, chest tightness, shortness of breath and wheezing.   Cardiovascular: Negative for chest pain.  Gastrointestinal: Positive for nausea, vomiting and abdominal pain. Negative for diarrhea, constipation and abdominal distention.  Endocrine: Negative for polydipsia, polyphagia and polyuria.  Genitourinary: Negative for dysuria, frequency and hematuria.  Musculoskeletal: Negative for gait problem.  Skin: Negative for color change,  pallor and rash.  Neurological: Negative for dizziness, syncope, light-headedness and headaches.  Hematological: Does not bruise/bleed easily.  Psychiatric/Behavioral: Negative for behavioral problems and confusion.      Allergies  Review of patient's allergies indicates no known allergies.  Home Medications   Prior to Admission medications   Medication Sig Start Date End Date Taking? Authorizing Provider  aspirin 81 MG tablet Take 81 mg by mouth daily.     Yes Historical Provider, MD  fish oil-omega-3 fatty acids 1000 MG capsule Take 2 g by mouth daily.     Yes Historical Provider, MD  metoprolol tartrate (LOPRESSOR) 25 MG tablet Take 0.5 tablets (12.5 mg total) by mouth 2 (two) times daily. 10/13/12  Yes Sherren Mocha, MD  nitroGLYCERIN (NITROSTAT) 0.4 MG SL tablet Place 1 tablet (0.4 mg total) under the tongue every 5 (five) minutes as needed. 03/24/12  Yes Sherren Mocha, MD  ondansetron (ZOFRAN ODT) 4 MG disintegrating tablet Take 1 tablet (4 mg total) by mouth every 8 (eight) hours as needed for nausea. 06/05/13  Yes Johnna Acosta, MD  oxymetazoline (AFRIN) 0.05 % nasal spray Place 2 sprays into both nostrils 2 (two) times daily as needed for congestion.   Yes Historical Provider, MD  predniSONE (DELTASONE) 20 MG tablet Take 2 tablets (40 mg total) by mouth daily. 06/05/13  Yes Johnna Acosta, MD  promethazine (PHENERGAN) 25 MG tablet Take 25 mg by mouth every 6 (six) hours as needed for nausea or vomiting.   Yes Historical Provider, MD  rosuvastatin (CRESTOR) 10 MG tablet Take 10 mg by mouth daily.   Yes Historical Provider, MD   BP 116/61  Pulse 56  Temp(Src) 98.1 F (36.7 C) (Oral)  Resp 12  SpO2 98% Physical Exam  Constitutional: He is oriented to person, place, and time. He appears well-developed and well-nourished. No distress.  HENT:  Head: Normocephalic.  Multiple mid face, and periorbital petechiae.  Eyes: Conjunctivae are normal. Pupils are equal, round, and reactive  to light. No scleral icterus.  Neck: Normal range of motion. Neck supple. No thyromegaly present.  Cardiovascular: Normal rate and regular rhythm.  Exam reveals no gallop and no friction rub.   No murmur heard. Pulmonary/Chest: Effort normal and breath sounds normal. No respiratory distress. He has no wheezes. He has no rales.  Abdominal: Soft. Bowel sounds are normal. He exhibits no distension. There is no tenderness. There is no rebound.  Soft abdomen.  No mass.  No palpable pulsation.  Soft midline upper abdomen without frank hernia.  No abdominal bruit.  Musculoskeletal: Normal range of motion.  Neurological: He is alert and oriented to person, place, and time.  Skin: Skin is warm and dry. No rash noted.  Psychiatric: He has a normal mood and affect. His behavior is normal.  Femoral, and DP pulses symmetric, 1+. LE well perfused.   ED Course  Procedures (including critical care time) Labs Review Labs Reviewed  BASIC METABOLIC PANEL - Abnormal; Notable for the following:    Glucose, Bld 104 (*)    Creatinine, Ser 1.38 (*)    GFR calc non Af Amer 52 (*)    GFR calc Af Amer 60 (*)    All other components within normal limits    Imaging Review Ct Abdomen Pelvis Wo Contrast  06/05/2013   CLINICAL DATA:  Abdominal pain and cramping.  Hematuria.  EXAM: CT ABDOMEN AND PELVIS WITHOUT CONTRAST  TECHNIQUE: Multidetector CT imaging of the abdomen and pelvis was performed following the standard protocol without IV contrast.  COMPARISON:  None.  FINDINGS: Minimal dependent atelectasis in the visualized lung bases. Coronary calcification. Unremarkable liver, spleen, right adrenal gland, pancreas. Unenhanced CT was performed per clinician order. Lack of IV contrast limits sensitivity and specificity, especially for evaluation of abdominal/pelvic solid viscera.  At least 2 subcentimeter partially calcified stones layer in the dependent aspect of the nondilated gallbladder. 15 mm low-attenuation left  adrenal nodule, probably benign adenomas in the absence of history of primary carcinoma. . Small probable cysts in the upper pole left kidney and from the lower pole right kidney. No hydronephrosis.  Fusiform distal aortic aneurysm measuring up to 5.8 cm maximum transverse diameter, tapering to a diameter of 3.6 cm at the bifurcation. Iliac arteries are atheromatous but non aneurysmal. There is no evidence of retroperitoneal hemorrhage nor hyperdense crescent to suggest impending rupture.  Stomach, small bowel, colon are nondilated. Multiple descending and sigmoid diverticula, with some mild inflammatory/edematous changes around the descending/sigmoid junction. No abscess. No free air. No ascites. Urinary bladder physiologically distended. Moderate prostatic enlargement with central coarse calcifications. No adenopathy.  IMPRESSION: 1. Diverticulitis at the descending/sigmoid junction without abscess. 2. 5.8 cm abdominal aortic aneurysm. Vascular surgery consultation recommended due to increased risk of rupture for AAA >5.5 cm. This recommendation follows ACR consensus. This recommendation follows ACR consensus guidelines: White Paper of the ACR Incidental Findings Committee II on Vascular Findings. J Am Coll Radiol 2013; 10:789-794.   Electronically Signed   By: Arne Cleveland M.D.   On:  06/05/2013 12:52   Dg Chest 2 View  06/05/2013   CLINICAL DATA:  Vomiting and facial swelling.  EXAM: CHEST  2 VIEW  COMPARISON:  Chest radiograph performed 10/20/2008  FINDINGS: The lungs are well-aerated and clear. There is no evidence of focal opacification, pleural effusion or pneumothorax.  The heart is normal in size; the mediastinal contour is within normal limits. No acute osseous abnormalities are seen.  IMPRESSION: No acute cardiopulmonary process seen.   Electronically Signed   By: Garald Balding M.D.   On: 06/05/2013 04:47     EKG Interpretation None      MDM   Final diagnoses:  Diverticulitis  AAA  (abdominal aortic aneurysm)    CT abd/pelvis obtained (non-contrast 2/2 nausea/mild CRI).  Shows multiple diverticuli, with inflamation around descending/sigmoid jct.  Also, a  5.8cm infrarenal/distal fusiform AAA.  No signs of retro-peritoneal hemorrhage, or rupture.    Discussed with patient.  Nausea and pain are resolved. Given only robinul for colicky pain, and currently symptom free.  Discussed Ct findings.  Recommend antibiotics for diverticuli, and vascular consult re: Aneurysm.  Call placed to Dr. Trula Slade, returned call immediately.  Pt and Ct discussed.  Dr. Trula Slade will see patient in ED.  Patient made aware that Dr. Trula Slade will see in ER.  Pt with recurrent nausea.  Re-medicated.  IV antibiotics complete.  Vascular surgeon (Dr. Trula Slade) does not feel that sx are from AAA.  Would prefer CTA with IV contrast.  Will ask for admission for unassigned medicine patient, Antibiotics for diverticulitis, hydration, antiemetics, repeat creatnines, vascular will follow re:  eval and tx of AAA    Tanna Furry, MD 06/05/13 [place], MD 06/05/13 [phone]

## 2013-06-05 NOTE — ED Notes (Signed)
Dr Miller at bedside. 

## 2013-06-05 NOTE — ED Notes (Signed)
Pt c/o  Nausea  Med given

## 2013-06-05 NOTE — ED Notes (Signed)
Pt up to b/r. Developed episode of nausea, dizziness, and abd pain return. Returned to stretcher and sat down, sx lessened, but still remains. Wife present at [facility]. EDP Dr. Sabra Heck in to room to speak with and assess pt. D/c plan and options discussed.

## 2013-06-05 NOTE — ED Notes (Signed)
Report called to 5n rn

## 2013-06-05 NOTE — ED Notes (Signed)
Pt attempting to sleep.  Med half infused

## 2013-06-05 NOTE — ED Provider Notes (Signed)
CSN: [id number]     Arrival date & time 06/05/13  0340 History   First MD Initiated Contact with Patient 06/05/13 0344     Chief Complaint  Patient presents with  . Emesis  . Facial Swelling     (Consider location/radiation/quality/duration/timing/severity/associated sxs/prior Treatment) HPI Comments: 68 year old male, history of hypertension and coronary disease status post stenting. He presents with a complaint of nausea and vomiting. He states that approximately 36 hours ago he had eaten at a [employer], he has been having intermittent sharp pains in his abdomen earlier in the day but states that after eating dinner that night he became more nauseated and has had rather persistent vomiting since that time. He notes that his abdominal pain comes on slightly before he gets nauseated and vomits. His spouse states that his vomiting is so aggressive and violent that he has developed a rash across his face and in addition to this when his symptoms first started she noted his face to be swollen including his eyelids which she states were swollen shut. This has improved, he did get Benadryl. He denies any itching, denies tongue swelling.  He states that these symptoms all started within 5 days of starting losartan which he took in place of lisinopril which gave him a dry cough. He has since stopped that medication at the request of his family physician who's nurse gave him information over the phone. He has not been seen for this illness prior to this time.  Nausea and vomiting is persistent, nothing seems to make it better or worse, not associated with chest pain shortness of breath cough or back pain. He denies dysuria diarrhea bleeding or swelling. He is a lifelong heavy smoker.  Patient is a 68 y.o. male presenting with vomiting. The history is provided by the patient and the spouse.  Emesis   Past Medical History  Diagnosis Date  . Coronary atherosclerosis of native coronary artery    inferior wall MI 2010  . Essential hypertension, benign   . Pure hypercholesterolemia    History reviewed. No pertinent past surgical history. No family history on file. History  Substance Use Topics  . Smoking status: Former Smoker    Types: Cigarettes    Quit date: 07/25/2010  . Smokeless tobacco: Never Used  . Alcohol Use: Not on file    Review of Systems  Gastrointestinal: Positive for vomiting.  All other systems reviewed and are negative.     Allergies  Review of patient's allergies indicates no known allergies.  Home Medications   Prior to Admission medications   Medication Sig Start Date End Date Taking? Authorizing Provider  aspirin 81 MG tablet Take 81 mg by mouth daily.      Historical Provider, MD  celecoxib (CELEBREX) 200 MG capsule Take 1 capsule (200 mg total) by mouth daily. 02/26/13   Sherren Mocha, MD  fish oil-omega-3 fatty acids 1000 MG capsule Take 2 g by mouth daily.      Historical Provider, MD  losartan (COZAAR) 50 MG tablet Take 1 tablet (50 mg total) by mouth daily. 05/28/13   Sherren Mocha, MD  metoprolol tartrate (LOPRESSOR) 25 MG tablet Take 0.5 tablets (12.5 mg total) by mouth 2 (two) times daily. 10/13/12   Sherren Mocha, MD  nitroGLYCERIN (NITROSTAT) 0.4 MG SL tablet Place 1 tablet (0.4 mg total) under the tongue every 5 (five) minutes as needed. 03/24/12   Sherren Mocha, MD  rosuvastatin (CRESTOR) 10 MG tablet TAKE ONE TABLET BY MOUTH ONCE  DAILY 05/25/13   Burtis Junes, NP   BP 118/59  Pulse 50  Temp(Src) 98 F (36.7 C) (Oral)  Resp 15  Ht 5\' 10"  (1.778 m)  Wt 201 lb (91.173 kg)  BMI 28.84 kg/m2  SpO2 99% Physical Exam  Nursing note and vitals reviewed. Constitutional: He appears well-developed and well-nourished. No distress.  HENT:  Head: Normocephalic and atraumatic.  Mouth/Throat: Oropharynx is clear and moist. No oropharyngeal exudate.  No periorbital edema, lips and tongue appear normal, mucous membranes are moist.  Eyes:  Conjunctivae and EOM are normal. Pupils are equal, round, and reactive to light. Right eye exhibits no discharge. Left eye exhibits no discharge. No scleral icterus.  Neck: Normal range of motion. Neck supple. No JVD present. No thyromegaly present.  Cardiovascular: Normal rate, regular rhythm, normal heart sounds and intact distal pulses.  Exam reveals no gallop and no friction rub.   No murmur heard. Pulmonary/Chest: Effort normal. No respiratory distress. He has wheezes (mild). He has no rales.  Abdominal: Soft. Bowel sounds are normal. He exhibits no distension and no mass. There is no tenderness.  Musculoskeletal: Normal range of motion. He exhibits no edema and no tenderness.  Lymphadenopathy:    He has no cervical adenopathy.  Neurological: He is alert. Coordination normal.  Skin: Skin is warm and dry. Rash noted. No erythema.  Scattered petechiae around the eyes and on the cheeks. Not present on the extremities  Psychiatric: He has a normal mood and affect. His behavior is normal.    ED Course  Procedures (including critical care time) Labs Review Labs Reviewed  CBC WITH DIFFERENTIAL - Abnormal; Notable for the following:    RBC 3.40 (*)    Hemoglobin 10.2 (*)    HCT 30.6 (*)    RDW 20.2 (*)    Platelets 129 (*)    Monocytes Relative 17 (*)    Monocytes Absolute 1.3 (*)    All other components within normal limits  COMPREHENSIVE METABOLIC PANEL - Abnormal; Notable for the following:    Glucose, Bld 111 (*)    Creatinine, Ser 1.41 (*)    GFR calc non Af Amer 50 (*)    GFR calc Af Amer 58 (*)    All other components within normal limits  LIPASE, BLOOD  URINALYSIS, ROUTINE W REFLEX MICROSCOPIC  I-STAT TROPOININ, ED    Imaging Review Dg Chest 2 View  06/05/2013   CLINICAL DATA:  Vomiting and facial swelling.  EXAM: CHEST  2 VIEW  COMPARISON:  Chest radiograph performed 10/20/2008  FINDINGS: The lungs are well-aerated and clear. There is no evidence of focal opacification,  pleural effusion or pneumothorax.  The heart is normal in size; the mediastinal contour is within normal limits. No acute osseous abnormalities are seen.  IMPRESSION: No acute cardiopulmonary process seen.   Electronically Signed   By: Garald Balding M.D.   On: 06/05/2013 04:47     EKG Interpretation   Date/Time:  Saturday Jun 05 2013 04:34:25 EDT Ventricular Rate:  53 PR Interval:  182 QRS Duration: 99 QT Interval:  458 QTC Calculation: 430 R Axis:   74 Text Interpretation:  Sinus bradycardia ECG OTHERWISE WITHIN NORMAL LIMITS  Since last tracing T wave abnormality has resolved Confirmed by Sabra Heck   MD, [place] ([id number]) on 06/05/2013 4:39:10 AM      MDM   Final diagnoses:  Allergic reaction  Nausea and vomiting  Renal insufficiency  Anemia    The patient does have mild  wheezing though I suspect this is chronic for this patient given his smoking history. He has no shortness of breath, no swelling of the legs, no swelling of the face. He does have petechiae scattered around his face and his significant other states that this is not unusual for him when he vomits aggressively. The patient does not have any significant abdominal tenderness though he does state that at the abdominal pain starts right before he gets nauseated and he points to the periumbilical area. We'll check for pancreatitis, check for cardiac disease, vital signs are unremarkable, Zofran given IV.  The patient was counseled regarding his blood pressure, regarding his use of the antihypertensive that is likely causing his symptoms and he agrees to stop using this temporarily until he follows up with his doctor. He will be placed on prednisone and given prescriptions for Zofran and Phenergan as needed. The patient expresses understanding to the indications for return. He was aware of a slight renal insufficiency, he was unaware of his anemia. I have encouraged him to follow up with his doctor for possible colonoscopy.  Meds  given in ED:  Medications  ondansetron (ZOFRAN) injection 4 mg (4 mg Intravenous Given 06/05/13 0430)    New Prescriptions   ONDANSETRON (ZOFRAN ODT) 4 MG DISINTEGRATING TABLET    Take 1 tablet (4 mg total) by mouth every 8 (eight) hours as needed for nausea.   PREDNISONE (DELTASONE) 20 MG TABLET    Take 2 tablets (40 mg total) by mouth daily.   PROMETHAZINE (PHENERGAN) 25 MG TABLET    Take 1 tablet (25 mg total) by mouth every 6 (six) hours as needed for nausea or vomiting.        Johnna Acosta, MD 06/05/13 [phone]

## 2013-06-05 NOTE — H&P (Signed)
Date: 06/05/2013               Patient Name:  [name] MRN: [id number]  DOB: 12/21/1945 Age / Sex: 68 y.o., male   PCP: No Pcp Per Patient         Medical Service: Internal Medicine Teaching Service         Attending Physician: Dr. Axel Filler, MD    First Contact: Dr. Duwaine Maxin Pager: [phone]  Second Contact: Dr. Clinton Gallant Pager: [phone]       After Hours (After 5p/  First Contact Pager: [phone]  weekends / holidays): Second Contact Pager: [phone]   Chief Complaint:  N/V and abd pain  History of Present Illness: 68 year old male with a PMH of CAD (inferior MI 2010), HTN, HLD and bradycardia who presents with 4 day history of N/V and abdominal pain.  The pain is LLQ.  He reports several episodes of vomiting per day.  He denies diarrhea or fevers.  Originally evaluated in ED overnight and was discharged this morning with suspected drug reaction and prescribed Prednisone, Zofran and Phenergan which he has not yet filled.  He returned to ED today because of continued abdominal pain.    In the ED:  T 68F, RR18, SpO2100%, HR 40s-60, BP low 100s-140s/50-80s; he received Cipro, Flagyl, NS  Meds: Current Facility-Administered Medications  Medication Dose Route Frequency Provider Last Rate Last Dose  . metroNIDAZOLE (FLAGYL) IVPB 500 mg  500 mg Intravenous Once Tanna Furry, MD      . scopolamine (TRANSDERM-SCOP) 1 MG/3DAYS 1.5 mg  1 patch Transdermal Q72H Tanna Furry, MD   1.5 mg at 06/05/13 1321   Current Outpatient Prescriptions  Medication Sig Dispense Refill  . aspirin 81 MG tablet Take 81 mg by mouth daily.        . fish oil-omega-3 fatty acids 1000 MG capsule Take 2 g by mouth daily.        . metoprolol tartrate (LOPRESSOR) 25 MG tablet Take 0.5 tablets (12.5 mg total) by mouth 2 (two) times daily.  90 tablet  3  . nitroGLYCERIN (NITROSTAT) 0.4 MG SL tablet Place 1 tablet (0.4 mg total) under the tongue every 5 (five) minutes as needed.  25 tablet  1  . ondansetron  (ZOFRAN ODT) 4 MG disintegrating tablet Take 1 tablet (4 mg total) by mouth every 8 (eight) hours as needed for nausea.  10 tablet  0  . oxymetazoline (AFRIN) 0.05 % nasal spray Place 2 sprays into both nostrils 2 (two) times daily as needed for congestion.      . predniSONE (DELTASONE) 20 MG tablet Take 2 tablets (40 mg total) by mouth daily.  10 tablet  0  . promethazine (PHENERGAN) 25 MG tablet Take 25 mg by mouth every 6 (six) hours as needed for nausea or vomiting.      . rosuvastatin (CRESTOR) 10 MG tablet Take 10 mg by mouth daily.        Allergies:``````````````````````` Allergies as of 06/05/2013  . (No Known Allergies)   Past Medical History  Diagnosis Date  . Coronary atherosclerosis of native coronary artery     inferior wall MI 2010  . Essential hypertension, benign   . Pure hypercholesterolemia    Past Surgical History  Procedure Laterality Date  . Cardiac stents  2010   History reviewed. No pertinent family history. History   Social History  . Marital Status: Married    Spouse Name: N/A  Number of Children: N/A  . Years of Education: N/A   Occupational History  . Not on file.   Social History Main Topics  . Smoking status: Former Smoker    Types: Cigarettes    Quit date: 07/25/2010  . Smokeless tobacco: Never Used  . Alcohol Use: No  . Drug Use: Not on file  . Sexual Activity: Not on file   Other Topics Concern  . Not on file   Social History Narrative  . No narrative on file    Review of Systems: Pertinent items are noted in HPI.  Physical Exam: Blood pressure 115/63, pulse 60, temperature 97.9 F (36.6 C), temperature source Oral, resp. rate 16, SpO2 98.00%. General: resting in bed in NAD HEENT: PERRL, EOMI, oropharynx clear Cardiac: RRR, no rubs, gallops, + systolic murmur, 2+ radial pulses B/L, 1+ R DP, 2+ L DP Pulm: clear to auscultation bilaterally, moving normal volumes of air Abd: soft, nondistended, BS present, LLQ TTP Ext: warm  and well perfused, no pedal edema Neuro: alert and oriented X3, cranial nerves II-XII grossly intact, responding appropriately, able to move all four extremities voluntarily  Lab results: Basic Metabolic Panel:  Recent Labs  06/05/13 0415 06/05/13 1220  NA 138 143  K 3.8 4.2  CL 99 104  CO2 26 27  GLUCOSE 111* 104*  BUN 20 18  CREATININE 1.41* 1.38*  CALCIUM 9.8 9.4   Liver Function Tests:  Recent Labs  06/05/13 0415  AST 33  ALT 31  ALKPHOS 83  BILITOT 0.5  PROT 8.0  ALBUMIN 4.4    Recent Labs  06/05/13 0415  LIPASE 51   CBC:  Recent Labs  06/05/13 0415  WBC 7.6  NEUTROABS 5.3  HGB 10.2*  HCT 30.6*  MCV 90.0  PLT 129*   Urinalysis:  Recent Labs  06/05/13 0512  COLORURINE YELLOW  LABSPEC 1.011  PHURINE 6.5  GLUCOSEU NEGATIVE  HGBUR NEGATIVE  BILIRUBINUR NEGATIVE  KETONESUR NEGATIVE  PROTEINUR NEGATIVE  UROBILINOGEN 0.2  NITRITE NEGATIVE  LEUKOCYTESUR NEGATIVE   Imaging results:  Ct Abdomen Pelvis Wo Contrast  06/05/2013   CLINICAL DATA:  Abdominal pain and cramping.  Hematuria.  EXAM: CT ABDOMEN AND PELVIS WITHOUT CONTRAST  TECHNIQUE: Multidetector CT imaging of the abdomen and pelvis was performed following the standard protocol without IV contrast.  COMPARISON:  None.  FINDINGS: Minimal dependent atelectasis in the visualized lung bases. Coronary calcification. Unremarkable liver, spleen, right adrenal gland, pancreas. Unenhanced CT was performed per clinician order. Lack of IV contrast limits sensitivity and specificity, especially for evaluation of abdominal/pelvic solid viscera.  At least 2 subcentimeter partially calcified stones layer in the dependent aspect of the nondilated gallbladder. 15 mm low-attenuation left adrenal nodule, probably benign adenomas in the absence of history of primary carcinoma. . Small probable cysts in the upper pole left kidney and from the lower pole right kidney. No hydronephrosis.  Fusiform distal aortic aneurysm  measuring up to 5.8 cm maximum transverse diameter, tapering to a diameter of 3.6 cm at the bifurcation. Iliac arteries are atheromatous but non aneurysmal. There is no evidence of retroperitoneal hemorrhage nor hyperdense crescent to suggest impending rupture.  Stomach, small bowel, colon are nondilated. Multiple descending and sigmoid diverticula, with some mild inflammatory/edematous changes around the descending/sigmoid junction. No abscess. No free air. No ascites. Urinary bladder physiologically distended. Moderate prostatic enlargement with central coarse calcifications. No adenopathy.  IMPRESSION: 1. Diverticulitis at the descending/sigmoid junction without abscess. 2. 5.8 cm abdominal aortic aneurysm.  Vascular surgery consultation recommended due to increased risk of rupture for AAA >5.5 cm. This recommendation follows ACR consensus. This recommendation follows ACR consensus guidelines: White Paper of the ACR Incidental Findings Committee II on Vascular Findings. J Am Coll Radiol 2013; 10:789-794.   Electronically Signed   By: Arne Cleveland M.D.   On: 06/05/2013 12:52   Dg Chest 2 View  06/05/2013   CLINICAL DATA:  Vomiting and facial swelling.  EXAM: CHEST  2 VIEW  COMPARISON:  Chest radiograph performed 10/20/2008  FINDINGS: The lungs are well-aerated and clear. There is no evidence of focal opacification, pleural effusion or pneumothorax.  The heart is normal in size; the mediastinal contour is within normal limits. No acute osseous abnormalities are seen.  IMPRESSION: No acute cardiopulmonary process seen.   Electronically Signed   By: Garald Balding M.D.   On: 06/05/2013 04:47    Assessment & Plan by Problem:  Diverticulitis:  abd pain, N/V x no fever x 4 days, LLQ TTP; CT reveals diverticulitis at the descending/sigmoid junction without abscess. - admit to telemetry - cipro and flagyl - zofran for nausea - morphine for pain - NPO  - NSS at 150cc/hr - monitor CBC  AAA:  CT abdomen  reveals 5.8cm AAA.  Has ~ 30 pack year smoking history.  No signs of leak or rupture but > 5.5cm requires surgical repair.  Vascular surgery consulted and do not feel the patient's symptoms are related to AAA.  They would like CT abdomen with contrast once AKI resolved.  Likely OR on 05/26. - CT abdomen with contrast after resolution of AKI - PT/INR in anticipation of OR - risk stratification - check HgbA1c, encourage smoking cessation; 05/2013 LDL 38 and patient is on Crestor - continue ASA, fish oil, Crestor once patient resumes po  AKI:  Baseline Cr 1.3.  Creatinine at admission 1.41-->1.38. - IV fluids - AM CMP to monitor renal function  Tobacco use disorder:  ~ 30 pack year history.  Likely contributed to AAA above.  He has quit successfully in the past for 8 months.  - nicotine patch - encourage smoking cessation  CAD/HLD/HTN:  No chest pain or EKG changes.  Trop negative.  BP stable.  - hold ASA, fish oil, Lopressor, Crestor while NPO  Diet:  NPO  VTE ppx:  Lovenox [place] Code:  Full  Dispo: Disposition is deferred at this time, awaiting improvement of current medical problems. Anticipated discharge in approximately 1-2 day(s).   The patient does not have a current PCP (No Pcp Per Patient) and does not know need an [facility] hospital follow-up appointment after discharge.  The patient does not know have transportation limitations that hinder transportation to clinic appointments.  Signed: Duwaine Maxin, DO 06/05/2013, 3:52 PM

## 2013-06-05 NOTE — ED Notes (Signed)
Back from xray, alert, NAD, calm, interactive, resps e/u, no dyspnea noted.

## 2013-06-05 NOTE — ED Notes (Addendum)
Pt not orthostatic. See VS. VSS.

## 2013-06-05 NOTE — Discharge Instructions (Signed)
Your testing has shown a slightly low blood count - you likely need a colonoscopy - talk to your doctor about this on Tuesday when you see them to follow up for a blood pressure check.  Please call your doctor for a followup appointment within 24-48 hours. When you talk to your doctor please let them know that you were seen in the emergency department and have them acquire all of your records so that they can discuss the findings with you and formulate a treatment plan to fully care for your new and ongoing problems.

## 2013-06-05 NOTE — H&P (Signed)
  I have seen and examined the patient myself, and I have reviewed the note by Arrie Aran, MS 3 and was present during the interview and physical exam.  Please see my separate H&P for additional findings, assessment, and plan.   Signed: Duwaine Maxin, DO 06/05/2013, 7:17 PM

## 2013-06-05 NOTE — H&P (Signed)
Subjective:   Patient is a 68 y.o. male presents with abdominal pain, n/v. Onset of symptoms was gradual starting a few days ago with gradually worsening course since that time. The pain is located periumbilical. Patient describes the pain as sharp   intermittent, occurring about every 1-2 hours and lasting 10-15 minutes, or until he vomits. Pain has been associated with n/v, malaise, constipation, weakness. Patient denies changes in vision, fever, diarrhea, cough, headache, dysuria, back pain. There are no clear aggravating or alleviating factors. Past history includes recent medication changes. He presented to the ED last night for these issues, was discharged this morning with suspected drug reaction, and was given prescriptions for prednisone, zofran and phenergan, which he did not fill.  He returned to the ED this afternoon because of increasing severity of abdominal pain. Previous studies include CBC, BMET, EKG, CXR, and CT abdomen, which revealed active diverticulitis and 5.8cm AAA.  Patient Active Problem List   Diagnosis Date Noted  . Diverticulitis 06/05/2013  . AAA (abdominal aortic aneurysm) without rupture 06/05/2013  . BRADYCARDIA 12/20/2008  . HYPERLIPIDEMIA 11/11/2008  . TOBACCO ABUSE 11/11/2008  . HYPERTENSION, UNSPECIFIED 11/11/2008  . MYOCARDIAL INFARCTION, HX OF 11/11/2008  . Coronary atherosclerosis of native coronary artery 11/11/2008   Past Medical History  Diagnosis Date  . Coronary atherosclerosis of native coronary artery     inferior wall MI 2010  . Essential hypertension, benign   . Pure hypercholesterolemia     Past Surgical History  Procedure Laterality Date  . Cardiac stents  2010     (Not in a hospital admission) No Known Allergies  History  Substance Use Topics  . Smoking status: Former Smoker    Types: Cigarettes    Quit date: 07/25/2010  . Smokeless tobacco: Never Used  . Alcohol Use: No    History reviewed. No pertinent family history.  Review  of Systems Pertinent items are noted in HPI.  Objective:   Patient Vitals for the past 8 hrs:  BP Temp Temp src Pulse Resp SpO2  06/05/13 1630 114/67 mmHg - - 55 12 97 %  06/05/13 1600 105/51 mmHg - - 55 14 98 %  06/05/13 1548 115/63 mmHg 97.9 F (36.6 C) Oral 60 16 98 %  06/05/13 1500 124/66 mmHg - - 59 12 100 %  06/05/13 1430 119/58 mmHg - - 56 11 99 %  06/05/13 1406 116/61 mmHg - - 56 12 98 %  06/05/13 1215 95/72 mmHg - - 55 12 95 %  06/05/13 1200 119/61 mmHg - - 58 12 96 %  06/05/13 1158 119/61 mmHg 98.1 F (36.7 C) Oral - 17 97 %     Total I/O In: 200 [I.V.:200] Out: -     BP 114/67  Pulse 55  Temp(Src) 97.9 F (36.6 C) (Oral)  Resp 12  SpO2 97% General appearance: alert, cooperative, appears stated age, no distress and moderately obese Head: Normocephalic, without obvious abnormality, atraumatic, bilateral petechiae on face, most notable on superior aspects of brows Eyes: conjunctivae/corneas clear, PERRL, EOM's intact Throat: lips, mucosa, and tongue normal; teeth and gums normal Lungs: wheezes bilaterally, mild Heart: regular rate and rhythm and systolic murmur: systolic ejection 2/6,     Abdomen: BS present, soft, non-distended, tender LLQ Extremities: extremities normal, atraumatic, no cyanosis or edema and anterior tibial photodermatitis noted  Pulses:  L radial 2+ R radial 2+   L posterior tibial 2+ R posterior tibial 1+  L dorsalis pedis 2+ R dorsalis pedis 1+  Skin: color, texture, turgor normal, except as noted above Neurologic: Cranial nerves: normal  ECG: bradycardic at 53, normal sinus rhythm, no blocks or conduction defects, no ischemic changes.  Data Review  CBC:  Lab Results  Component Value Date   WBC 7.6 06/05/2013   RBC 3.40* 06/05/2013    Ref. Range 06/05/2013 12:20  Sodium Latest Range: 137-147 mEq/L 143  Potassium Latest Range: 3.7-5.3 mEq/L 4.2  Chloride Latest Range: 96-112 mEq/L 104  CO2 Latest Range: 19-32 mEq/L 27  BUN  Latest Range: 6-23 mg/dL 18  Creatinine Latest Range: 0.50-1.35 mg/dL 1.38 (H)  Calcium Latest Range: 8.4-10.5 mg/dL 9.4  GFR calc non Af Amer Latest Range: >90 mL/min 52 (L)  GFR calc Af Amer Latest Range: >90 mL/min 60 (L)  Glucose Latest Range: 70-99 mg/dL 104 (H)   BMP:  Lab Results  Component Value Date   GLUCOSE 104* 06/05/2013   CO2 27 06/05/2013   BUN 18 06/05/2013   CREATININE 1.38* 06/05/2013   CALCIUM 9.4 06/05/2013   Troponins 0.00 U/A wnl  CXR 06/05/13 0446: Radiology review:CHEST 2 VIEW  COMPARISON: Chest radiograph performed 10/20/2008  FINDINGS:  The lungs are well-aerated and clear. There is no evidence of focal  opacification, pleural effusion or pneumothorax.  The heart is normal in size; the mediastinal contour is within  normal limits. No acute osseous abnormalities are seen.  IMPRESSION:  No acute cardiopulmonary process seen.   06/05/13 1238: CT ABDOMEN AND PELVIS WITHOUT CONTRAST  TECHNIQUE:  Multidetector CT imaging of the abdomen and pelvis was performed  following the standard protocol without IV contrast.  COMPARISON: None.  FINDINGS:  Minimal dependent atelectasis in the visualized lung bases. Coronary  calcification. Unremarkable liver, spleen, right adrenal gland,  pancreas. Unenhanced CT was performed per clinician order. Lack of  IV contrast limits sensitivity and specificity, especially for  evaluation of abdominal/pelvic solid viscera.  At least 2 subcentimeter partially calcified stones layer in the  dependent aspect of the nondilated gallbladder. 15 mm  low-attenuation left adrenal nodule, probably benign adenomas in the  absence of history of primary carcinoma. . Small probable cysts in  the upper pole left kidney and from the lower pole right kidney. No  hydronephrosis.  Fusiform distal aortic aneurysm measuring up to 5.8 cm maximum  transverse diameter, tapering to a diameter of 3.6 cm at the  bifurcation. Iliac arteries are  atheromatous but non aneurysmal.  There is no evidence of retroperitoneal hemorrhage nor hyperdense  crescent to suggest impending rupture.  Stomach, small bowel, colon are nondilated. Multiple descending and  sigmoid diverticula, with some mild inflammatory/edematous changes  around the descending/sigmoid junction. No abscess. No free air. No  ascites. Urinary bladder physiologically distended. Moderate  prostatic enlargement with central coarse calcifications. No  adenopathy.  IMPRESSION:  1. Diverticulitis at the descending/sigmoid junction without  abscess.  2. 5.8 cm abdominal aortic aneurysm. Vascular surgery consultation  recommended due to increased risk of rupture for AAA >5.5 cm. This  recommendation follows ACR consensus. This recommendation follows  ACR consensus guidelines: White Paper of the ACR Incidental Findings  Committee II on Vascular Findings. J Am Coll Radiol 2013;  10:789-794.    Assessment/Plan:   Principal Problem:   Diverticulitis Active Problems:   AAA (abdominal aortic aneurysm) without rupture  Diverticulitis - confirmed by CT, Small possibility symptoms could be due to intestinal ischemia, given his abdominal vasculopathy -admit to telemetry - ciprofloxacin IV 400mg  BID, will transition to 500mg  PO once oral  can be tolerated - metronidazole IV 500mg  TID, will transition to PO once oral can be tolerated -clear liquid diet -protonix 40mg  BID -scopolamine patch placed - morphine 2mg  q3h PRN for pain - zofran 8mg  IV or PO q6h PRN for n/v -CBC and CMET daily   AAA (abdominal aortic aneurysm) without rupture -Vascular surgery consulted, appreciate req's: will require CT with contrast, which necessitates fluid resuscitation to prevent further kidney damage (GFR 52) - given liter bolus NS - IV NS 139ml/hr   BRADYCARDIA - likely due to beta blockade.  Currently asymptomatic -monitor for SS's   HYPERLIPIDEMIA - currently well-controlled -continue  home Crestor once PO can be tolerated   TOBACCO ABUSE - smoking cessation counseling given -nicotine patch offered while inpatient   HYPERTENSION, UNSPECIFIED - BPs currently running low normal.  Will hold home losartan for possibility of ADR. - continue home metoprolol once PO tolerated   MYOCARDIAL INFARCTION, HX OF - will hold ASA for possible upcoming surgery   Health Maintenance: -Hemoglobin A1C - risk stratification for possible post-op care -HIV screen - Pt status: never screened  Diet: Clear liquid diet   VTE ppx: Sharon lovenox, SCDs   Code: Full   Dispo: Disposition is deferred at this time, awaiting improvement of current medical problems, possible surgical candidate.

## 2013-06-05 NOTE — ED Notes (Signed)
Alert, NAD, calm, interactive, skin W&D, resps e/u, speaking in clear complete sentences. VSS. (Denies: pain, sob, nausea dizziness or other sx). "feels better/normal". Wife at [facility].

## 2013-06-05 NOTE — ED Notes (Signed)
The pts [ain is not very bad at present. The cipro has just infused

## 2013-06-06 ENCOUNTER — Encounter (HOSPITAL_COMMUNITY): Payer: Self-pay | Admitting: Student

## 2013-06-06 DIAGNOSIS — I10 Essential (primary) hypertension: Secondary | ICD-10-CM

## 2013-06-06 DIAGNOSIS — I252 Old myocardial infarction: Secondary | ICD-10-CM

## 2013-06-06 DIAGNOSIS — I498 Other specified cardiac arrhythmias: Secondary | ICD-10-CM

## 2013-06-06 DIAGNOSIS — Z01810 Encounter for preprocedural cardiovascular examination: Secondary | ICD-10-CM

## 2013-06-06 DIAGNOSIS — I2510 Atherosclerotic heart disease of native coronary artery without angina pectoris: Secondary | ICD-10-CM

## 2013-06-06 DIAGNOSIS — F17200 Nicotine dependence, unspecified, uncomplicated: Secondary | ICD-10-CM

## 2013-06-06 DIAGNOSIS — E785 Hyperlipidemia, unspecified: Secondary | ICD-10-CM

## 2013-06-06 LAB — URINALYSIS, ROUTINE W REFLEX MICROSCOPIC
BILIRUBIN URINE: NEGATIVE
Glucose, UA: NEGATIVE mg/dL
Hgb urine dipstick: NEGATIVE
Ketones, ur: NEGATIVE mg/dL
Leukocytes, UA: NEGATIVE
Nitrite: NEGATIVE
PROTEIN: NEGATIVE mg/dL
Specific Gravity, Urine: 1.009 (ref 1.005–1.030)
UROBILINOGEN UA: 0.2 mg/dL (ref 0.0–1.0)
pH: 6.5 (ref 5.0–8.0)

## 2013-06-06 LAB — COMPREHENSIVE METABOLIC PANEL
ALT: 25 U/L (ref 0–53)
AST: 28 U/L (ref 0–37)
Albumin: 4.1 g/dL (ref 3.5–5.2)
Alkaline Phosphatase: 83 U/L (ref 39–117)
BILIRUBIN TOTAL: 0.7 mg/dL (ref 0.3–1.2)
BUN: 18 mg/dL (ref 6–23)
CALCIUM: 8.7 mg/dL (ref 8.4–10.5)
CO2: 21 mEq/L (ref 19–32)
Chloride: 104 mEq/L (ref 96–112)
Creatinine, Ser: 1.35 mg/dL (ref 0.50–1.35)
GFR calc Af Amer: 62 mL/min — ABNORMAL LOW (ref >90)
GFR calc non Af Amer: 53 mL/min — ABNORMAL LOW (ref >90)
Glucose, Bld: 137 mg/dL — ABNORMAL HIGH (ref 70–99)
Potassium: 3.8 mEq/L (ref 3.7–5.3)
Sodium: 142 mEq/L (ref 137–147)
Total Protein: 7.5 g/dL (ref 6.0–8.3)

## 2013-06-06 LAB — CBC
HCT: 28.9 % — ABNORMAL LOW (ref 39.0–52.0)
Hemoglobin: 9.6 g/dL — ABNORMAL LOW (ref 13.0–17.0)
MCH: 30.1 pg (ref 26.0–34.0)
MCHC: 33.2 g/dL (ref 30.0–36.0)
MCV: 90.6 fL (ref 78.0–100.0)
PLATELETS: 123 10*3/uL — AB (ref 150–400)
RBC: 3.19 MIL/uL — AB (ref 4.22–5.81)
RDW: 20.6 % — ABNORMAL HIGH (ref 11.5–15.5)
WBC: 8.3 10*3/uL (ref 4.0–10.5)

## 2013-06-06 LAB — HEMOGLOBIN A1C
Hgb A1c MFr Bld: 5.6 % (ref <5.7)
MEAN PLASMA GLUCOSE: 114 mg/dL (ref <117)

## 2013-06-06 LAB — HIV ANTIBODY (ROUTINE TESTING W REFLEX): HIV 1&2 Ab, 4th Generation: NONREACTIVE

## 2013-06-06 LAB — PROTIME-INR
INR: 1.24 (ref 0.00–1.49)
PROTHROMBIN TIME: 15.3 s — AB (ref 11.6–15.2)

## 2013-06-06 MED ORDER — CIPROFLOXACIN IN D5W 400 MG/200ML IV SOLN
400.0000 mg | Freq: Two times a day (BID) | INTRAVENOUS | Status: DC
  Administered 2013-06-06 – 2013-06-08 (×5): 400 mg via INTRAVENOUS
  Filled 2013-06-06 (×6): qty 200

## 2013-06-06 MED ORDER — REGADENOSON 0.4 MG/5ML IV SOLN
0.4000 mg | Freq: Once | INTRAVENOUS | Status: AC
  Administered 2013-06-10: 0.4 mg via INTRAVENOUS
  Filled 2013-06-06: qty 5

## 2013-06-06 MED ORDER — METRONIDAZOLE IN NACL 5-0.79 MG/ML-% IV SOLN
500.0000 mg | Freq: Three times a day (TID) | INTRAVENOUS | Status: DC
  Administered 2013-06-06 – 2013-06-08 (×6): 500 mg via INTRAVENOUS
  Filled 2013-06-06 (×8): qty 100

## 2013-06-06 MED ORDER — ONDANSETRON HCL 4 MG PO TABS: 8.0000 mg | ORAL_TABLET | Freq: Four times a day (QID) | ORAL | Status: DC | PRN

## 2013-06-06 MED ORDER — ONDANSETRON 8 MG/NS 50 ML IVPB
8.0000 mg | Freq: Four times a day (QID) | INTRAVENOUS | Status: DC | PRN
  Administered 2013-06-07 (×2): 8 mg via INTRAVENOUS
  Filled 2013-06-06 (×3): qty 8

## 2013-06-06 NOTE — Progress Notes (Addendum)
    Subjective  -   Dry heaves all night abd with no significant change   Physical Exam:  abd soft, mildly tender Extremities warm  CV: RRR, pedal pulses not palpable       Assessment/Plan:    Continues to have nausea, which is severe and not well controlled with meds.  This further supports that his complaints are related to his diverticulitis and not his AAA.  I will ask cards to see him today  Continue with hydration.  Cr stable Ultrasound studies today  If improves, will get CTA tomorrow am  Restarted ASA, does not need to be held for vascular surgery.  OK for lovenox, would hold on the am of surgery  Serafina Mitchell 06/06/2013 8:54 AM --  Danley Danker Vitals:   06/06/13 0445  BP: 106/48  Pulse: 65  Temp: 99.8 F (37.7 C)  Resp: 18    Intake/Output Summary (Last 24 hours) at 06/06/13 0854 Last data filed at 06/06/13 0447  Gross per 24 hour  Intake    200 ml  Output   1150 ml  Net   -950 ml     Laboratory CBC    Component Value Date/Time   WBC 8.3 06/06/2013 0550   HGB 9.6* 06/06/2013 0550   HCT 28.9* 06/06/2013 0550   PLT 123* 06/06/2013 0550    BMET    Component Value Date/Time   NA 142 06/06/2013 0550   K 3.8 06/06/2013 0550   CL 104 06/06/2013 0550   CO2 21 06/06/2013 0550   GLUCOSE 137* 06/06/2013 0550   BUN 18 06/06/2013 0550   CREATININE 1.35 06/06/2013 0550   CALCIUM 8.7 06/06/2013 0550   GFRNONAA 53* 06/06/2013 0550   GFRAA 62* 06/06/2013 0550    COAG Lab Results  Component Value Date   INR 1.24 06/05/2013   INR 1.66* 10/20/2008   No results found for this basename: PTT    Antibiotics Anti-infectives   Start     Dose/Rate Route Frequency Ordered Stop   06/06/13 0815  ciprofloxacin (CIPRO) IVPB 400 mg     400 mg 200 mL/hr over 60 Minutes Intravenous Every 12 hours 06/06/13 0806     06/06/13 0240  ciprofloxacin (CIPRO) IVPB 400 mg     400 mg 200 mL/hr over 60 Minutes Intravenous  Once 06/05/13 1839 06/06/13 0257   06/05/13 1315   metroNIDAZOLE (FLAGYL) IVPB 500 mg     500 mg 100 mL/hr over 60 Minutes Intravenous  Once 06/05/13 1307 06/05/13 1722   06/05/13 1315  ciprofloxacin (CIPRO) IVPB 400 mg     400 mg 200 mL/hr over 60 Minutes Intravenous  Once 06/05/13 1307 06/05/13 1549       V. Leia Alf, M.D. [facility] Office: [phone] Pager:  [phone]

## 2013-06-06 NOTE — Progress Notes (Signed)
VASCULAR LAB PRELIMINARY  ARTERIAL  ABI completed:    RIGHT    LEFT    PRESSURE WAVEFORM  PRESSURE WAVEFORM  BRACHIAL 124 triphasic BRACHIAL  triphasic  DP   DP    AT 110 triphasic AT 87 monophasic  PT 121 triphasic PT 93 triphasic  PER   PER    GREAT TOE  NA GREAT TOE  NA    RIGHT LEFT  ABI 0.98 0.75     Charlaine Dalton, RVT 06/06/2013, 2:31 PM

## 2013-06-06 NOTE — Consult Note (Signed)
CARDIOLOGY CONSULT NOTE   Patient ID: [name] MRN: [id number], DOB/AGE: 1945/06/14   Admit date: 06/05/2013 Date of Consult: 06/06/2013  Primary Physician: No PCP Per Patient Primary Cardiologist: Jerilynn Mages. Burt Knack, MD   Pt. Profile  68 y/o male with a h/o CAD s/p Inf STEMI and RCA stenting (BMS) in 10/2008 who was admitted with abdominal pain and found to have a 5.8 cm AAA.  Problem List  Past Medical History  Diagnosis Date  . Coronary atherosclerosis of native coronary artery     a. 10/2008 inf STEMI/PCI: LM 50d (IVUS-borderline lesion->med rx), LAD min irregs, LCX 8m, 70d, OM nl, RCA 142m (3.5x28 Vision BMS);  b. 11/2008 Lexiscan MV: EF 65%, no isch/scar.  . Essential hypertension, benign   . Pure hypercholesterolemia   . AAA (abdominal aortic aneurysm)     a. 05/2013 CT: 5.8 cm AAA.  Marland Kitchen Diverticulitis     a. 05/2013 CT: descending/sigmoid jxn w/o abscess.  . Osteoarthritis   . Tobacco abuse     a. ongoing - 1ppd for better part of 50 yrs.  . Normocytic anemia     Past Surgical History  Procedure Laterality Date  . Cardiac stents  2010    Allergies  No Known Allergies  HPI   68 y/o male with a h/o CAD s/p inferior STEMI in 10/2008.  Cath at that time showed moderate distal LM dzs and LCX dzs, along with an occluded RCA.  The RCA was the culprit and treated with a Vision BMS.  He was later taken back to the lab for IVUS of the LM and it was felt to be a borderline lesion and thus has been treated medically.  He has done reasonably well over the past 4 1/2 yrs.  He quit smoking in 2012 for a brief period but is currently smoking 1ppd.  From a cardiac standpoint, he works out in his yard on a very regular basis.  He does not experience c/p or dyspnea but his activity is limited by L>R LE claudication.  He denies chest pain, palpitations, dyspnea, pnd, orthopnea, syncope, edema, weight gain, or early satiety.  He was last seen in clinic on 5/12 at which time he reported some  reduction in his energy levels and myoview was scheduled.  He put it off until the end of July.    Unfortunately, last week, pt developed nausea, vomiting, dizziness, and LLQ abdominal discomfort w/o diarrhea or fevers.  He was seen in the ED early in the morning on 5/23.  Labs were unrevealing and cxr was nl.  He felt somewhat better and was discharged around 7am. Unfortunately, upon returning home, he had recurrent abd pain, n, v, and presented back to the ED before noon the same day.  CT was performed of the abd and showed diverticulitis along with a 5.8 cm fusiform distal Ao aneurysm.  Pt was admitted and has continued to have n/v with abdominal pain.  He has been seen by vascular surgery and there is some concern that he may require AAA repair, though his Ss are felt primarily to be 2/2 diverticulitis.  Abd U/S is ordered with a plan for CTA of the abd tomorrow if he is stable.  Inpatient Medications  . ciprofloxacin  400 mg Intravenous Q12H  . enoxaparin (LOVENOX) injection  40 mg Subcutaneous Q24H  . nicotine  14 mg Transdermal Daily  . pantoprazole (PROTONIX) IV  40 mg Intravenous Q12H  . scopolamine  1 patch Transdermal Q72H  .  sodium chloride  3 mL Intravenous Q12H   Family History Family History  Problem Relation Age of Onset  . Heart attack Brother   . Cancer Father   . Cancer Mother     Social History History   Social History  . Marital Status: Married    Spouse Name: N/A    Number of Children: N/A  . Years of Education: N/A   Occupational History  . Not on file.   Social History Main Topics  . Smoking status: Heavy Tobacco Smoker -- 1.00 packs/day for 50 years    Types: Cigarettes    Last Attempt to Quit: 07/25/2010  . Smokeless tobacco: Never Used  . Alcohol Use: No  . Drug Use: No  . Sexual Activity: Not on file   Other Topics Concern  . Not on file   Social History Narrative   Lives in [place] with wife.  Retired Furniture conservator/restorer.  Works out in the yard often -  limited by claudication.  Does not routinely exercise.    Review of Systems  General:  Malaise in setting of n/v/abd pain.  No chills, fever, night sweats or weight changes.  Cardiovascular:  No chest pain, dyspnea on exertion, edema, orthopnea, palpitations, paroxysmal nocturnal dyspnea. Dermatological: No rash, lesions/masses Respiratory: No cough, dyspnea Urologic: No hematuria, dysuria Abdominal:   +++ nausea, vomiting, and abd pain.  No diarrhea, bright red blood per rectum, melena, or hematemesis Neurologic:  No visual changes, wkns, changes in mental status. All other systems reviewed and are otherwise negative except as noted above.  Physical Exam  Blood pressure 106/48, pulse 65, temperature 99.8 F (37.7 C), temperature source Oral, resp. rate 18, height 5\' 10"  (1.778 m), weight 199 lb (90.266 kg), SpO2 98.00%.  General: Pleasant, NAD.  Very groggy, just go morphine. Psych: Normal affect. Neuro: Alert and oriented X 3. Moves all extremities spontaneously. HEENT: Normal  Neck: Supple without bruits or JVD. Lungs:  Resp regular and unlabored, CTA. Heart: RRR no s3, s4, or murmurs. Abdomen: protuberant with LLQ tenderness.  BS+x4.  No abd bruits. Extremities: No clubbing, cyanosis or edema. DP/PT/Radials 1+ and equal bilaterally.  Labs  Lab Results  Component Value Date   WBC 8.3 06/06/2013   HGB 9.6* 06/06/2013   HCT 28.9* 06/06/2013   MCV 90.6 06/06/2013   PLT 123* 06/06/2013     Recent Labs Lab 06/06/13 0550  NA 142  K 3.8  CL 104  CO2 21  BUN 18  CREATININE 1.35  CALCIUM 8.7  PROT 7.5  BILITOT 0.7  ALKPHOS 83  ALT 25  AST 28  GLUCOSE 137*   Lab Results  Component Value Date   CHOL 81 05/25/2013   HDL 24.40* 05/25/2013   LDLCALC 38 05/25/2013   TRIG 94.0 05/25/2013   Radiology/Studies  Ct Abdomen Pelvis Wo Contrast  06/05/2013   CLINICAL DATA:  Abdominal pain and cramping.  Hematuria.  EXAM: CT ABDOMEN AND PELVIS WITHOUT CONTRAST  TECHNIQUE:  Multidetector CT imaging of the abdomen and pelvis was performed following the standard protocol without IV contrast.  COMPARISON:  None.  FINDINGS: Minimal dependent atelectasis in the visualized lung bases. Coronary calcification. Unremarkable liver, spleen, right adrenal gland, pancreas. Unenhanced CT was performed per clinician order. Lack of IV contrast limits sensitivity and specificity, especially for evaluation of abdominal/pelvic solid viscera.  At least 2 subcentimeter partially calcified stones layer in the dependent aspect of the nondilated gallbladder. 15 mm low-attenuation left adrenal nodule, probably benign adenomas in  the absence of history of primary carcinoma. . Small probable cysts in the upper pole left kidney and from the lower pole right kidney. No hydronephrosis.  Fusiform distal aortic aneurysm measuring up to 5.8 cm maximum transverse diameter, tapering to a diameter of 3.6 cm at the bifurcation. Iliac arteries are atheromatous but non aneurysmal. There is no evidence of retroperitoneal hemorrhage nor hyperdense crescent to suggest impending rupture.  Stomach, small bowel, colon are nondilated. Multiple descending and sigmoid diverticula, with some mild inflammatory/edematous changes around the descending/sigmoid junction. No abscess. No free air. No ascites. Urinary bladder physiologically distended. Moderate prostatic enlargement with central coarse calcifications. No adenopathy.  IMPRESSION: 1. Diverticulitis at the descending/sigmoid junction without abscess. 2. 5.8 cm abdominal aortic aneurysm. Vascular surgery consultation recommended due to increased risk of rupture for AAA >5.5 cm. This recommendation follows ACR consensus. This recommendation follows ACR consensus guidelines: White Paper of the ACR Incidental Findings Committee II on Vascular Findings. J Am Coll Radiol 2013; 10:789-794.   Electronically Signed   By: Arne Cleveland M.D.   On: 06/05/2013 12:52   Dg Chest 2  View  06/05/2013   CLINICAL DATA:  Vomiting and facial swelling.  EXAM: CHEST  2 VIEW  COMPARISON:  Chest radiograph performed 10/20/2008  FINDINGS: The lungs are well-aerated and clear. There is no evidence of focal opacification, pleural effusion or pneumothorax.  The heart is normal in size; the mediastinal contour is within normal limits. No acute osseous abnormalities are seen.  IMPRESSION: No acute cardiopulmonary process seen.   Electronically Signed   By: Garald Balding M.D.   On: 06/05/2013 04:47   ECG  SB, 53, no acute ST/T changes.  ASSESSMENT AND PLAN  1.  AAA:  5.8 cm fusiform distal Ao aneurysm by CT yesterday.  Vascular surgery on board and considering further intervention early in the week.  From a cardiac standpoint, it sounds as though he mostly has done well w/o chest pain or dyspnea, though he does limit his activity some 2/2 L>R LE claudication.  He was scheduled for a stress test, which he scheduled for 7/29.  In the setting of his h/o CAD and inf MI in 2010 with residual, moderate LM and LCX dzs, along with ongoing tob abuse and apparent development of PVD, we will arrange for a lexiscan myoview preoperatively - tomorrow if he has had some relief from n/v/abd pain, but more likely Tuesday depending upon Ss.  2.  CAD:  As above, will arrange for lexiscan MV in setting of known moderate CAD s/p inf MI and RCA stenting nearly 4 1/2 yrs ago with ongoing risk factors, development of PVD, and need for abdominal/vascular surgery.  Resume bb/statin once taking PO's.  3.  Acute diverticulitis:  Mgmt per IM.  He is on cipro and prn antiemetics.  Still very uncomfortable.  4.  HTN:  Stable.  5.  HL:  On crestor @ home.  Would resume once taking PO's.  6.  Tob Abuse:  Cessation advised.  7.  Left > Right LE claudication:  Never worked up previously.  ABI's ordered.  Signed, Rogelia Mire, NP 06/06/2013, 12:01 PM   Agree with note by Ignacia Bayley NP  Pt of Dr Burt Knack with  H/O CAD s/p inf STEMI 2010 with mod LM disease by IVUS managed medically. Ongoing tobacco abuse, Other CRF as outlined. Pt admitted with Sx and imaging c/w diverticulitis with incidentally recognized 5.8 cm infra renal AAA. Still experiencing abd pain. Denies CP.  Exam otherwise benign except for LLQ tenderness to palpation. EKG w/o acute changes. Agree that AAA will need to be fixed but first needs clinical resolution of diverticulitis and Lexiscan myoview to risk stratify given intermediate LM dx 5 years ago with ongoing tobacco abuse, Will follow along with you.  Lorretta Harp, M.D., [place], [facility], [place] [place] [address] [place], [place]  [address]  [phone] 06/06/2013 12:36 PM

## 2013-06-06 NOTE — Progress Notes (Signed)
Subjective: The patient's wife says he had N/V all night.  He was seen this AM but is asleep.  His wife declines PE because he has not slept in several days 2/2 to N/V/abd pain and she does not want me to wake him.    Objective: Vital signs in last 24 hours: Filed Vitals:   06/05/13 1842 06/05/13 2055 06/06/13 0445 06/06/13 1612  BP: 117/73 100/54 106/48 113/50  Pulse: 116 58 65 70  Temp: 98.5 F (36.9 C) 98.7 F (37.1 C) 99.8 F (37.7 C) 98.6 F (37 C)  TempSrc: Oral Oral Oral Oral  Resp:  15 18 18   Height: 5\' 10"  (1.778 m)     Weight: 90.266 kg (199 lb)     SpO2: 96% 98% 98% 100%   Weight change:   Intake/Output Summary (Last 24 hours) at 06/06/13 1619 Last data filed at 06/06/13 1200  Gross per 24 hour  Intake    200 ml  Output   1150 ml  Net   -950 ml   Patient's wife asked that I not examine him because he finally asleep and has not slept in days. He was asleep on his side and she allowed me to listen to his lungs which were CTA B/L.   Bowel sounds were present.  He appeared comfortable.  Lab Results: Basic Metabolic Panel:  Recent Labs Lab 06/05/13 1220 06/06/13 0550  NA 143 142  K 4.2 3.8  CL 104 104  CO2 27 21  GLUCOSE 104* 137*  BUN 18 18  CREATININE 1.38* 1.35  CALCIUM 9.4 8.7   Liver Function Tests:  Recent Labs Lab 06/05/13 0415 06/06/13 0550  AST 33 28  ALT 31 25  ALKPHOS 83 83  BILITOT 0.5 0.7  PROT 8.0 7.5  ALBUMIN 4.4 4.1    Recent Labs Lab 06/05/13 0415  LIPASE 51   CBC:  Recent Labs Lab 06/05/13 0415 06/06/13 0550  WBC 7.6 8.3  NEUTROABS 5.3  --   HGB 10.2* 9.6*  HCT 30.6* 28.9*  MCV 90.0 90.6  PLT 129* 123*   Hemoglobin A1C:  Recent Labs Lab 06/05/13 2037  HGBA1C 5.6   Thyroid Function Tests:  Recent Labs Lab 06/05/13 2037  TSH 1.550   Coagulation:  Recent Labs Lab 06/05/13 2037  LABPROT 15.3*  INR 1.24   Urinalysis:  Recent Labs Lab 06/05/13 1724 06/06/13 0050  COLORURINE YELLOW YELLOW    LABSPEC 1.013 1.009  PHURINE 7.0 6.5  GLUCOSEU NEGATIVE NEGATIVE  HGBUR NEGATIVE NEGATIVE  BILIRUBINUR NEGATIVE NEGATIVE  KETONESUR NEGATIVE NEGATIVE  PROTEINUR NEGATIVE NEGATIVE  UROBILINOGEN 0.2 0.2  NITRITE NEGATIVE NEGATIVE  LEUKOCYTESUR NEGATIVE NEGATIVE   Studies/Results: Ct Abdomen Pelvis Wo Contrast  06/05/2013   CLINICAL DATA:  Abdominal pain and cramping.  Hematuria.  EXAM: CT ABDOMEN AND PELVIS WITHOUT CONTRAST  TECHNIQUE: Multidetector CT imaging of the abdomen and pelvis was performed following the standard protocol without IV contrast.  COMPARISON:  None.  FINDINGS: Minimal dependent atelectasis in the visualized lung bases. Coronary calcification. Unremarkable liver, spleen, right adrenal gland, pancreas. Unenhanced CT was performed per clinician order. Lack of IV contrast limits sensitivity and specificity, especially for evaluation of abdominal/pelvic solid viscera.  At least 2 subcentimeter partially calcified stones layer in the dependent aspect of the nondilated gallbladder. 15 mm low-attenuation left adrenal nodule, probably benign adenomas in the absence of history of primary carcinoma. . Small probable cysts in the upper pole left kidney and from the lower pole right  kidney. No hydronephrosis.  Fusiform distal aortic aneurysm measuring up to 5.8 cm maximum transverse diameter, tapering to a diameter of 3.6 cm at the bifurcation. Iliac arteries are atheromatous but non aneurysmal. There is no evidence of retroperitoneal hemorrhage nor hyperdense crescent to suggest impending rupture.  Stomach, small bowel, colon are nondilated. Multiple descending and sigmoid diverticula, with some mild inflammatory/edematous changes around the descending/sigmoid junction. No abscess. No free air. No ascites. Urinary bladder physiologically distended. Moderate prostatic enlargement with central coarse calcifications. No adenopathy.  IMPRESSION: 1. Diverticulitis at the descending/sigmoid junction  without abscess. 2. 5.8 cm abdominal aortic aneurysm. Vascular surgery consultation recommended due to increased risk of rupture for AAA >5.5 cm. This recommendation follows ACR consensus. This recommendation follows ACR consensus guidelines: White Paper of the ACR Incidental Findings Committee II on Vascular Findings. J Am Coll Radiol 2013; 10:789-794.   Electronically Signed   By: Arne Cleveland M.D.   On: 06/05/2013 12:52   Dg Chest 2 View  06/05/2013   CLINICAL DATA:  Vomiting and facial swelling.  EXAM: CHEST  2 VIEW  COMPARISON:  Chest radiograph performed 10/20/2008  FINDINGS: The lungs are well-aerated and clear. There is no evidence of focal opacification, pleural effusion or pneumothorax.  The heart is normal in size; the mediastinal contour is within normal limits. No acute osseous abnormalities are seen.  IMPRESSION: No acute cardiopulmonary process seen.   Electronically Signed   By: Garald Balding M.D.   On: 06/05/2013 04:47   Medications: I have reviewed the patient's current medications. Scheduled Meds: . ciprofloxacin  400 mg Intravenous Q12H  . enoxaparin (LOVENOX) injection  40 mg Subcutaneous Q24H  . nicotine  14 mg Transdermal Daily  . pantoprazole (PROTONIX) IV  40 mg Intravenous Q12H  . [START ON 06/07/2013] regadenoson  0.4 mg Intravenous Once  . scopolamine  1 patch Transdermal Q72H  . sodium chloride  3 mL Intravenous Q12H   Continuous Infusions: . sodium chloride 150 mL/hr at 06/05/13 1733   PRN Meds:.morphine injection, ondansetron (ZOFRAN) IV, ondansetron, promethazine  Assessment/Plan: Diverticulitis: abd pain, N/V x no fever x 4 days, LLQ TTP; CT reveals diverticulitis at the descending/sigmoid junction without abscess.  Wife says morphine has eased his symptoms and he can finally sleep. - cipro and flagyl  - zofran for nausea - increased dose to 8mg  q6h - morphine for pain  - NPO  - NSS at 150cc/hr  - monitor CBC   AAA: CT abdomen reveals 5.8cm AAA. Has ~  30 pack year smoking history. No signs of leak or rupture but > 5.5cm requires surgical repair. Vascular surgery consulted and do not feel the patient's symptoms are related to AAA. They would like CT chest,/abdomen/pelvis with contrast once AKI resolved. Likely OR on 05/26.  Appreciate cardiology and vascular surgery recommendations. - CT chest and abd/pelvis with contrast (likely 05/25) - carotid dopplers, ABI and lower extremity doppler results pending - urgent repair should he decompensate - pre-op lexiscan myoview once N/V/abd pain resolve  - risk stratification - encourage smoking cessation; 05/2013 LDL 38 and patient is on Crestor  - continue ASA, fish oil, Crestor once patient resumes po   AKI, improving: Baseline Cr 1.3. Creatinine at admission 1.41-->1.38-->1.35.  - IV fluids  - AM CMP to monitor renal function   Tobacco use disorder: ~ 30 pack year history. Likely contributed to AAA above. He has quit successfully in the past for 8 months.  - nicotine patch  - encourage smoking cessation  CAD/HLD/HTN: No chest pain or EKG changes. Trop negative. BP stable.  - hold ASA, fish oil, Lopressor, Crestor while NPO   Diet: NPO  VTE ppx: Lovenox [place]  Code: Full  Dispo: Disposition is deferred at this time, awaiting improvement of current medical problems.  Anticipated discharge in approximately 2-3 day(s).   The patient does have a current PCP Sherren Mocha, MD) and does not need an [facility] hospital follow-up appointment after discharge.  The patient does not know have transportation limitations that hinder transportation to clinic appointments.  .Services Needed at time of discharge: Y = Yes, Blank = No PT:   OT:   RN:   Equipment:   Other:     LOS: 1 day   Duwaine Maxin, DO 06/06/2013, 4:19 PM

## 2013-06-06 NOTE — H&P (Addendum)
Internal Medicine On-Call Attending Admission Note Date: 06/06/2013  Patient name: [name] Medical record number: [id number] Date of birth: [birth year] Age: 68 y.o. Gender: male  I saw and evaluated the patient. I reviewed the resident's note and I agree with the resident's findings and plan as documented in the resident's note, with the following additional comments.  Chief Complaint(s): Abdominal pain, nausea and vomiting  History - key components related to admission: Patient is a 69 year old man with history of coronary artery disease status post inferior MI, status post RCA stenting, hypertension, hyperlipidemia, and other problems as outlined in the medical history admitted with a four-day history of nausea vomiting and abdominal pain.  The pain has been midabdominal and left lower quadrant in location.  His symptoms were initially thought to be due to an adverse drug reaction to losartan when he was seen in the ED, and he was treated with prednisone and antiemetics; he returned because of continued abdominal pain.  He denies recent chest pain, shortness of breath, orthopnea, or PND.   Physical Exam - key components related to admission:  Filed Vitals:   06/05/13 1815 06/05/13 1842 06/05/13 2055 06/06/13 0445  BP: 108/70 117/73 100/54 106/48  Pulse: 55 116 58 65  Temp:  98.5 F (36.9 C) 98.7 F (37.1 C) 99.8 F (37.7 C)  TempSrc:  Oral Oral Oral  Resp: 13  15 18   Height:  5\' 10"  (1.778 m)    Weight:  199 lb (90.266 kg)    SpO2: 97% 96% 98% 98%    General: Alert, oriented; ill appearing due to nausea Lungs: Clear Back: No CVA tenderness Heart: Regular; 2/6 systolic murmur; no S3, no S4 Abdomen: Bowel sounds present, soft; moderate left lower quadrant tenderness Extremities: No edema  Lab results:   Basic Metabolic Panel:  Recent Labs  06/05/13 1220 06/06/13 0550  NA 143 142  K 4.2 3.8  CL 104 104  CO2 27 21  GLUCOSE 104* 137*  BUN 18 18  CREATININE 1.38*  1.35  CALCIUM 9.4 8.7    Liver Function Tests:  Recent Labs  06/05/13 0415 06/06/13 0550  AST 33 28  ALT 31 25  ALKPHOS 83 83  BILITOT 0.5 0.7  PROT 8.0 7.5  ALBUMIN 4.4 4.1    Recent Labs  06/05/13 0415  LIPASE 51     CBC:  Recent Labs  06/05/13 0415 06/06/13 0550  WBC 7.6 8.3  HGB 10.2* 9.6*  HCT 30.6* 28.9*  MCV 90.0 90.6  PLT 129* 123*    Recent Labs  06/05/13 0415  NEUTROABS 5.3  LYMPHSABS 1.0  MONOABS 1.3*  EOSABS 0.1  BASOSABS 0.1     Hemoglobin A1C:  Recent Labs  06/05/13 2037  HGBA1C 5.6     Thyroid Function Tests:  Recent Labs  06/05/13 2037  TSH 1.550     Coagulation:  Recent Labs  06/05/13 2037  INR 1.24    Urine Drug Screen: Drugs of Abuse     Component Value Date/Time   LABOPIA NONE DETECTED 06/05/2013 1724   COCAINSCRNUR NONE DETECTED 06/05/2013 1724   LABBENZ NONE DETECTED 06/05/2013 1724   AMPHETMU NONE DETECTED 06/05/2013 1724   THCU NONE DETECTED 06/05/2013 1724   LABBARB NONE DETECTED 06/05/2013 1724      Urinalysis    Component Value Date/Time   COLORURINE YELLOW 06/06/2013 0050   APPEARANCEUR CLEAR 06/06/2013 0050   LABSPEC 1.009 06/06/2013 0050   PHURINE 6.5 06/06/2013 0050   GLUCOSEU NEGATIVE  06/06/2013 0050   HGBUR NEGATIVE 06/06/2013 0050   BILIRUBINUR NEGATIVE 06/06/2013 0050   KETONESUR NEGATIVE 06/06/2013 0050   PROTEINUR NEGATIVE 06/06/2013 0050   UROBILINOGEN 0.2 06/06/2013 0050   NITRITE NEGATIVE 06/06/2013 0050   LEUKOCYTESUR NEGATIVE 06/06/2013 0050     Imaging results:  Ct Abdomen Pelvis Wo Contrast  06/05/2013   CLINICAL DATA:  Abdominal pain and cramping.  Hematuria.  EXAM: CT ABDOMEN AND PELVIS WITHOUT CONTRAST  TECHNIQUE: Multidetector CT imaging of the abdomen and pelvis was performed following the standard protocol without IV contrast.  COMPARISON:  None.  FINDINGS: Minimal dependent atelectasis in the visualized lung bases. Coronary calcification. Unremarkable liver, spleen, right  adrenal gland, pancreas. Unenhanced CT was performed per clinician order. Lack of IV contrast limits sensitivity and specificity, especially for evaluation of abdominal/pelvic solid viscera.  At least 2 subcentimeter partially calcified stones layer in the dependent aspect of the nondilated gallbladder. 15 mm low-attenuation left adrenal nodule, probably benign adenomas in the absence of history of primary carcinoma. . Small probable cysts in the upper pole left kidney and from the lower pole right kidney. No hydronephrosis.  Fusiform distal aortic aneurysm measuring up to 5.8 cm maximum transverse diameter, tapering to a diameter of 3.6 cm at the bifurcation. Iliac arteries are atheromatous but non aneurysmal. There is no evidence of retroperitoneal hemorrhage nor hyperdense crescent to suggest impending rupture.  Stomach, small bowel, colon are nondilated. Multiple descending and sigmoid diverticula, with some mild inflammatory/edematous changes around the descending/sigmoid junction. No abscess. No free air. No ascites. Urinary bladder physiologically distended. Moderate prostatic enlargement with central coarse calcifications. No adenopathy.  IMPRESSION: 1. Diverticulitis at the descending/sigmoid junction without abscess. 2. 5.8 cm abdominal aortic aneurysm. Vascular surgery consultation recommended due to increased risk of rupture for AAA >5.5 cm. This recommendation follows ACR consensus. This recommendation follows ACR consensus guidelines: White Paper of the ACR Incidental Findings Committee II on Vascular Findings. J Am Coll Radiol 2013; 10:789-794.   Electronically Signed   By: Arne Cleveland M.D.   On: 06/05/2013 12:52   Dg Chest 2 View  06/05/2013   CLINICAL DATA:  Vomiting and facial swelling.  EXAM: CHEST  2 VIEW  COMPARISON:  Chest radiograph performed 10/20/2008  FINDINGS: The lungs are well-aerated and clear. There is no evidence of focal opacification, pleural effusion or pneumothorax.  The  heart is normal in size; the mediastinal contour is within normal limits. No acute osseous abnormalities are seen.  IMPRESSION: No acute cardiopulmonary process seen.   Electronically Signed   By: Garald Balding M.D.   On: 06/05/2013 04:47    Other results: EKG: Sinus bradycardia, otherwise normal  Assessment & Plan by Problem:  1.  Diverticulitis.  Plan is empiric IV antibiotics (ciprofloxacin and metronidazole); antiemetics; pain control; n.p.o.; IV fluid.  2.  Abdominal aortic aneurysm.  Vascular surgery has seen patient, and given the large size plans to repair this admission when patient is improved with regard to his diverticulitis.  3.  Coronary artery disease.  Patient does not have active symptoms; cardiology consulted for preoperative evaluation.  4.  Other problems and plans as per the resident physician's note.

## 2013-06-06 NOTE — Progress Notes (Signed)
Subjective: [name] has had significant n/v throughout his stay, and reports that he has not had improvement from any medical intervention thus far. He did not tolerate the drowsy effects of the phenergan well, as it made him want to lie down, which aggravated his nausea.  Also, morphine causes pruritis in his groin area, though he has tolerated percocet in the past.    Objective: Vital signs in last 24 hours: Temp:  [97.9 F (36.6 C)-99.8 F (37.7 C)] 99.8 F (37.7 C) (05/24 0445) Pulse Rate:  [55-116] 65 (05/24 0445) Resp:  [11-18] 18 (05/24 0445) BP: (95-124)/(48-73) 106/48 mmHg (05/24 0445) SpO2:  [95 %-100 %] 98 % (05/24 0445) Weight:  [90.266 kg (199 lb)] 90.266 kg (199 lb) (05/23 1842)  Intake/Output from previous day: 05/23 0701 - 05/24 0700 In: 200 [I.V.:200] Out: 1150 [Urine:1150] Intake/Output this shift:   General appearance: alert, cooperative, appears stated age, no distress and moderately obese, sitting upright on edge of bed with garbage can nearby Head: bilateral scattered petechiae on face, most notable on superior aspects of brows  Eyes: conjunctivae/corneas clear Throat: lips, mucosa, and tongue normal Lungs: wheezes bilaterally, mild  Heart: regular rate and rhythm and systolic murmur: systolic ejection 2/6  Abdomen: BS present, soft, non-distended, tender LLQ  Extremities: extremities normal, atraumatic, no cyanosis or edema and anterior tibial photodermatitis noted   Results for orders placed during the hospital encounter of 06/05/13 (from the past 24 hour(s))  BASIC METABOLIC PANEL     Status: Abnormal   Collection Time    06/05/13 12:20 PM      Result Value Ref Range   Sodium 143  137 - 147 mEq/L   Potassium 4.2  3.7 - 5.3 mEq/L   Chloride 104  96 - 112 mEq/L   CO2 27  19 - 32 mEq/L   Glucose, Bld 104 (*) 70 - 99 mg/dL   BUN 18  6 - 23 mg/dL   Creatinine, Ser 1.38 (*) 0.50 - 1.35 mg/dL   Calcium 9.4  8.4 - 10.5 mg/dL   GFR calc non Af Amer 52 (*)  >90 mL/min   GFR calc Af Amer 60 (*) >90 mL/min  URINE RAPID DRUG SCREEN (HOSP PERFORMED)     Status: None   Collection Time    06/05/13  5:24 PM      Result Value Ref Range   Opiates NONE DETECTED  NONE DETECTED   Cocaine NONE DETECTED  NONE DETECTED   Benzodiazepines NONE DETECTED  NONE DETECTED   Amphetamines NONE DETECTED  NONE DETECTED   Tetrahydrocannabinol NONE DETECTED  NONE DETECTED   Barbiturates NONE DETECTED  NONE DETECTED  URINALYSIS, ROUTINE W REFLEX MICROSCOPIC     Status: None   Collection Time    06/05/13  5:24 PM      Result Value Ref Range   Color, Urine YELLOW  YELLOW   APPearance CLEAR  CLEAR   Specific Gravity, Urine 1.013  1.005 - 1.030   pH 7.0  5.0 - 8.0   Glucose, UA NEGATIVE  NEGATIVE mg/dL   Hgb urine dipstick NEGATIVE  NEGATIVE   Bilirubin Urine NEGATIVE  NEGATIVE   Ketones, ur NEGATIVE  NEGATIVE mg/dL   Protein, ur NEGATIVE  NEGATIVE mg/dL   Urobilinogen, UA 0.2  0.0 - 1.0 mg/dL   Nitrite NEGATIVE  NEGATIVE   Leukocytes, UA NEGATIVE  NEGATIVE  TSH     Status: None   Collection Time    06/05/13  8:37 PM  Result Value Ref Range   TSH 1.550  0.350 - 4.500 uIU/mL  HEMOGLOBIN A1C     Status: None   Collection Time    06/05/13  8:37 PM      Result Value Ref Range   Hemoglobin A1C 5.6  <5.7 %   Mean Plasma Glucose 114  <117 mg/dL  PROTIME-INR     Status: Abnormal   Collection Time    06/05/13  8:37 PM      Result Value Ref Range   Prothrombin Time 15.3 (*) 11.6 - 15.2 seconds   INR 1.24  0.00 - 1.49  HIV ANTIBODY (ROUTINE TESTING)     Status: None   Collection Time    06/05/13  8:37 PM      Result Value Ref Range   HIV 1&2 Ab, 4th Generation NONREACTIVE  NONREACTIVE  URINALYSIS, ROUTINE W REFLEX MICROSCOPIC     Status: None   Collection Time    06/06/13 12:50 AM      Result Value Ref Range   Color, Urine YELLOW  YELLOW   APPearance CLEAR  CLEAR   Specific Gravity, Urine 1.009  1.005 - 1.030   pH 6.5  5.0 - 8.0   Glucose, UA NEGATIVE   NEGATIVE mg/dL   Hgb urine dipstick NEGATIVE  NEGATIVE   Bilirubin Urine NEGATIVE  NEGATIVE   Ketones, ur NEGATIVE  NEGATIVE mg/dL   Protein, ur NEGATIVE  NEGATIVE mg/dL   Urobilinogen, UA 0.2  0.0 - 1.0 mg/dL   Nitrite NEGATIVE  NEGATIVE   Leukocytes, UA NEGATIVE  NEGATIVE  COMPREHENSIVE METABOLIC PANEL     Status: Abnormal   Collection Time    06/06/13  5:50 AM      Result Value Ref Range   Sodium 142  137 - 147 mEq/L   Potassium 3.8  3.7 - 5.3 mEq/L   Chloride 104  96 - 112 mEq/L   CO2 21  19 - 32 mEq/L   Glucose, Bld 137 (*) 70 - 99 mg/dL   BUN 18  6 - 23 mg/dL   Creatinine, Ser 1.35  0.50 - 1.35 mg/dL   Calcium 8.7  8.4 - 10.5 mg/dL   Total Protein 7.5  6.0 - 8.3 g/dL   Albumin 4.1  3.5 - 5.2 g/dL   AST 28  0 - 37 U/L   ALT 25  0 - 53 U/L   Alkaline Phosphatase 83  39 - 117 U/L   Total Bilirubin 0.7  0.3 - 1.2 mg/dL   GFR calc non Af Amer 53 (*) >90 mL/min   GFR calc Af Amer 62 (*) >90 mL/min  CBC     Status: Abnormal   Collection Time    06/06/13  5:50 AM      Result Value Ref Range   WBC 8.3  4.0 - 10.5 K/uL   RBC 3.19 (*) 4.22 - 5.81 MIL/uL   Hemoglobin 9.6 (*) 13.0 - 17.0 g/dL   HCT 28.9 (*) 39.0 - 52.0 %   MCV 90.6  78.0 - 100.0 fL   MCH 30.1  26.0 - 34.0 pg   MCHC 33.2  30.0 - 36.0 g/dL   RDW 20.6 (*) 11.5 - 15.5 %   Platelets 123 (*) 150 - 400 K/uL    Studies/Results: Ct Abdomen Pelvis Wo Contrast  06/05/2013   CLINICAL DATA:  Abdominal pain and cramping.  Hematuria.  EXAM: CT ABDOMEN AND PELVIS WITHOUT CONTRAST  TECHNIQUE: Multidetector CT imaging of the abdomen  and pelvis was performed following the standard protocol without IV contrast.  COMPARISON:  None.  FINDINGS: Minimal dependent atelectasis in the visualized lung bases. Coronary calcification. Unremarkable liver, spleen, right adrenal gland, pancreas. Unenhanced CT was performed per clinician order. Lack of IV contrast limits sensitivity and specificity, especially for evaluation of abdominal/pelvic  solid viscera.  At least 2 subcentimeter partially calcified stones layer in the dependent aspect of the nondilated gallbladder. 15 mm low-attenuation left adrenal nodule, probably benign adenomas in the absence of history of primary carcinoma. . Small probable cysts in the upper pole left kidney and from the lower pole right kidney. No hydronephrosis.  Fusiform distal aortic aneurysm measuring up to 5.8 cm maximum transverse diameter, tapering to a diameter of 3.6 cm at the bifurcation. Iliac arteries are atheromatous but non aneurysmal. There is no evidence of retroperitoneal hemorrhage nor hyperdense crescent to suggest impending rupture.  Stomach, small bowel, colon are nondilated. Multiple descending and sigmoid diverticula, with some mild inflammatory/edematous changes around the descending/sigmoid junction. No abscess. No free air. No ascites. Urinary bladder physiologically distended. Moderate prostatic enlargement with central coarse calcifications. No adenopathy.  IMPRESSION: 1. Diverticulitis at the descending/sigmoid junction without abscess. 2. 5.8 cm abdominal aortic aneurysm. Vascular surgery consultation recommended due to increased risk of rupture for AAA >5.5 cm. This recommendation follows ACR consensus. This recommendation follows ACR consensus guidelines: White Paper of the ACR Incidental Findings Committee II on Vascular Findings. J Am Coll Radiol 2013; 10:789-794.   Electronically Signed   By: Arne Cleveland M.D.   On: 06/05/2013 12:52   Dg Chest 2 View  06/05/2013   CLINICAL DATA:  Vomiting and facial swelling.  EXAM: CHEST  2 VIEW  COMPARISON:  Chest radiograph performed 10/20/2008  FINDINGS: The lungs are well-aerated and clear. There is no evidence of focal opacification, pleural effusion or pneumothorax.  The heart is normal in size; the mediastinal contour is within normal limits. No acute osseous abnormalities are seen.  IMPRESSION: No acute cardiopulmonary process seen.    Electronically Signed   By: Garald Balding M.D.   On: 06/05/2013 04:47    Scheduled Meds: . ciprofloxacin  400 mg Intravenous Q12H  . enoxaparin (LOVENOX) injection  40 mg Subcutaneous Q24H  . nicotine  14 mg Transdermal Daily  . pantoprazole (PROTONIX) IV  40 mg Intravenous Q12H  . scopolamine  1 patch Transdermal Q72H  . sodium chloride  3 mL Intravenous Q12H   Continuous Infusions: . sodium chloride 150 mL/hr at 06/05/13 1733   PRN Meds:morphine injection, ondansetron (ZOFRAN) IV, ondansetron, promethazine  Assessment/Plan: Principal Problem:  Diverticulitis  Active Problems:  AAA (abdominal aortic aneurysm) without rupture     Diverticulitis - confirmed by CT, Small possibility symptoms could be due to intestinal ischemia, given his abdominal vasculopathy.  His n/v is prgressing, and has not improved with medical management.  Spoke with pharmacy, appreciate recommendation to increase zofran to 8mg  q6h.  Also suggested possibly increasing phenergan to 25mg  q6h, though pt not likely to tolerate this dose as he did not tolerate the lower dose.  -admit to telemetry  - ciprofloxacin IV 400mg  BID, will transition to 500mg  PO once oral can be tolerated  - metronidazole IV 500mg  TID, will transition to PO once oral can be tolerated  - NPO for bowel rest  -protonix 40mg  BID  -scopolamine patch placed  - d/c morphine, 2/2 ADR  - zofran 8mg  IV or PO q6h PRN for n/v  - phenergan 12.5mg  q6h -  CBC and CMET daily    AAA (abdominal aortic aneurysm) without rupture  -Vascular surgery consulted, appreciate req's: will require CT with contrast, which necessitates fluid resuscitation to prevent further kidney damage (GFR 52). Creatinine down to 1.35 today (baseline 1.3) - given liter bolus NS  - IV NS 123ml/hr  -plan for CTA w/ contrast tomorrow 5/25 -Vascular surgery following, plan for surgery on 5/26   BRADYCARDIA - likely due to beta blockade. Currently asymptomatic  -monitor for SS's   -daily EKG while on Zofran, monitor for QT interval changes   HYPERLIPIDEMIA - currently well-controlled  -continue home Crestor once PO can be tolerated    TOBACCO ABUSE - smoking cessation counseling given  -nicotine patch offered while inpatient    HYPERTENSION, UNSPECIFIED - BPs currently running low normal. Will hold home losartan for possibility of ADR.  - continue home metoprolol once PO tolerated    MYOCARDIAL INFARCTION, HX OF  - will hold ASA for possible upcoming surgery    Health Maintenance:  -Hemoglobin A1C - 5.6 -HIV screen - neg  Diet: Clear liquid diet   VTE ppx: [facility] lovenox, SCDs  -will discuss d/c lovenox for surgery  Code: Full   Dispo: Disposition is deferred at this time, awaiting improvement of current medical problems, possible surgical candidate.    LOS: 1 day   Barbie Haggis

## 2013-06-06 NOTE — Progress Notes (Signed)
  I have seen and examined the patient, and reviewed the daily progress note by Arrie Aran, MS 3 and discussed the care of the patient with them. Please see my progress note from 06/06/2013 for further details regarding assessment and plan.    Signed:  Duwaine Maxin, DO 06/06/2013, 5:09 PM

## 2013-06-07 ENCOUNTER — Other Ambulatory Visit (HOSPITAL_COMMUNITY): Payer: Medicare Other

## 2013-06-07 ENCOUNTER — Inpatient Hospital Stay (HOSPITAL_COMMUNITY): Payer: Medicare Other

## 2013-06-07 DIAGNOSIS — N179 Acute kidney failure, unspecified: Secondary | ICD-10-CM

## 2013-06-07 DIAGNOSIS — I714 Abdominal aortic aneurysm, without rupture, unspecified: Secondary | ICD-10-CM

## 2013-06-07 DIAGNOSIS — D649 Anemia, unspecified: Secondary | ICD-10-CM

## 2013-06-07 DIAGNOSIS — E876 Hypokalemia: Secondary | ICD-10-CM

## 2013-06-07 DIAGNOSIS — K5732 Diverticulitis of large intestine without perforation or abscess without bleeding: Principal | ICD-10-CM

## 2013-06-07 DIAGNOSIS — R112 Nausea with vomiting, unspecified: Secondary | ICD-10-CM

## 2013-06-07 DIAGNOSIS — Z01810 Encounter for preprocedural cardiovascular examination: Secondary | ICD-10-CM

## 2013-06-07 LAB — BASIC METABOLIC PANEL
BUN: 23 mg/dL (ref 6–23)
CALCIUM: 8.1 mg/dL — AB (ref 8.4–10.5)
CHLORIDE: 105 meq/L (ref 96–112)
CO2: 20 meq/L (ref 19–32)
CREATININE: 1.28 mg/dL (ref 0.50–1.35)
GFR calc Af Amer: 66 mL/min — ABNORMAL LOW (ref >90)
GFR calc non Af Amer: 57 mL/min — ABNORMAL LOW (ref >90)
GLUCOSE: 112 mg/dL — AB (ref 70–99)
Potassium: 3.4 mEq/L — ABNORMAL LOW (ref 3.7–5.3)
Sodium: 138 mEq/L (ref 137–147)

## 2013-06-07 LAB — CBC WITH DIFFERENTIAL/PLATELET
Basophils Absolute: 0 10*3/uL (ref 0.0–0.1)
Basophils Relative: 0 % (ref 0–1)
EOS ABS: 0 10*3/uL (ref 0.0–0.7)
Eosinophils Relative: 0 % (ref 0–5)
HEMATOCRIT: 24.6 % — AB (ref 39.0–52.0)
Hemoglobin: 8.1 g/dL — ABNORMAL LOW (ref 13.0–17.0)
Lymphocytes Relative: 12 % (ref 12–46)
Lymphs Abs: 0.7 10*3/uL (ref 0.7–4.0)
MCH: 29.7 pg (ref 26.0–34.0)
MCHC: 32.9 g/dL (ref 30.0–36.0)
MCV: 90.1 fL (ref 78.0–100.0)
MONO ABS: 1 10*3/uL (ref 0.1–1.0)
Monocytes Relative: 17 % — ABNORMAL HIGH (ref 3–12)
Neutro Abs: 4 10*3/uL (ref 1.7–7.7)
Neutrophils Relative %: 70 % (ref 43–77)
PLATELETS: 102 10*3/uL — AB (ref 150–400)
RBC: 2.73 MIL/uL — ABNORMAL LOW (ref 4.22–5.81)
RDW: 20.5 % — ABNORMAL HIGH (ref 11.5–15.5)
WBC: 5.6 10*3/uL (ref 4.0–10.5)

## 2013-06-07 LAB — FERRITIN: FERRITIN: 71 ng/mL (ref 22–322)

## 2013-06-07 LAB — RETICULOCYTES
RBC.: 2.73 MIL/uL — AB (ref 4.22–5.81)
RETIC COUNT ABSOLUTE: 49.1 10*3/uL (ref 19.0–186.0)
RETIC CT PCT: 1.8 % (ref 0.4–3.1)

## 2013-06-07 LAB — IRON AND TIBC
IRON: 71 ug/dL (ref 42–135)
Saturation Ratios: 22 % (ref 20–55)
TIBC: 316 ug/dL (ref 215–435)
UIBC: 245 ug/dL (ref 125–400)

## 2013-06-07 LAB — CBC
HEMATOCRIT: 24.6 % — AB (ref 39.0–52.0)
Hemoglobin: 8.2 g/dL — ABNORMAL LOW (ref 13.0–17.0)
MCH: 30 pg (ref 26.0–34.0)
MCHC: 33.3 g/dL (ref 30.0–36.0)
MCV: 90.1 fL (ref 78.0–100.0)
Platelets: 114 10*3/uL — ABNORMAL LOW (ref 150–400)
RBC: 2.73 MIL/uL — ABNORMAL LOW (ref 4.22–5.81)
RDW: 20.5 % — AB (ref 11.5–15.5)
WBC: 5.8 10*3/uL (ref 4.0–10.5)

## 2013-06-07 LAB — VITAMIN B12: Vitamin B-12: 330 pg/mL (ref 211–911)

## 2013-06-07 LAB — FOLATE: Folate: 8.5 ng/mL

## 2013-06-07 MED ORDER — HYDROMORPHONE HCL PF 1 MG/ML IJ SOLN
0.5000 mg | INTRAMUSCULAR | Status: DC | PRN
  Administered 2013-06-07 – 2013-06-11 (×14): 0.5 mg via INTRAVENOUS
  Filled 2013-06-07 (×14): qty 1

## 2013-06-07 MED ORDER — LORAZEPAM 2 MG/ML IJ SOLN
0.5000 mg | INTRAMUSCULAR | Status: DC | PRN
  Administered 2013-06-08 – 2013-06-11 (×5): 0.5 mg via INTRAVENOUS
  Filled 2013-06-07 (×5): qty 1

## 2013-06-07 MED ORDER — SODIUM CHLORIDE 0.9 % IV BOLUS (SEPSIS)
1000.0000 mL | Freq: Once | INTRAVENOUS | Status: AC
  Administered 2013-06-07: 1000 mL via INTRAVENOUS

## 2013-06-07 MED ORDER — IOHEXOL 350 MG/ML SOLN
100.0000 mL | Freq: Once | INTRAVENOUS | Status: AC | PRN
  Administered 2013-06-07: 100 mL via INTRAVENOUS

## 2013-06-07 MED ORDER — POTASSIUM CHLORIDE 10 MEQ/100ML IV SOLN
10.0000 meq | INTRAVENOUS | Status: AC
  Administered 2013-06-07 (×3): 10 meq via INTRAVENOUS
  Filled 2013-06-07 (×4): qty 100

## 2013-06-07 MED ORDER — ONDANSETRON 8 MG/NS 50 ML IVPB
8.0000 mg | Freq: Four times a day (QID) | INTRAVENOUS | Status: DC
  Administered 2013-06-07 – 2013-06-10 (×10): 8 mg via INTRAVENOUS
  Filled 2013-06-07 (×12): qty 8

## 2013-06-07 MED ORDER — PROMETHAZINE HCL 25 MG/ML IJ SOLN
6.2500 mg | Freq: Four times a day (QID) | INTRAMUSCULAR | Status: DC | PRN
  Administered 2013-06-08 (×2): 6.25 mg via INTRAVENOUS
  Filled 2013-06-07 (×2): qty 1

## 2013-06-07 MED ORDER — ONDANSETRON 8 MG/NS 50 ML IVPB
8.0000 mg | Freq: Four times a day (QID) | INTRAVENOUS | Status: DC
  Filled 2013-06-07 (×3): qty 8

## 2013-06-07 MED ORDER — ONDANSETRON HCL 8 MG PO TABS
8.0000 mg | ORAL_TABLET | Freq: Four times a day (QID) | ORAL | Status: DC
  Filled 2013-06-07 (×3): qty 1

## 2013-06-07 MED ORDER — ASPIRIN 81 MG PO CHEW
81.0000 mg | CHEWABLE_TABLET | Freq: Every day | ORAL | Status: DC
Start: 2013-06-07 — End: 2013-06-12
  Administered 2013-06-08 – 2013-06-12 (×5): 81 mg via ORAL
  Filled 2013-06-07 (×5): qty 1

## 2013-06-07 NOTE — Progress Notes (Addendum)
Internal Medicine On-Call Attending  Date: 06/07/2013  Patient name: [name] Medical record number: [id number] Date of birth: [birth year] Age: 67 y.o. Gender: male  I saw and evaluated the patient. I discussed patient and reviewed the resident's note by Dr. Redmond Pulling, and I agree with the resident's findings and plans as documented in her note, with the following additional comments.  Patient reports some improvement yesterday in his nausea and retching, but then the symptoms started again last night and have persisted.  Exam shows minimal left lower quadrant tenderness, improved since yesterday.  Hemoglobin today is 8.1, down from 10.2 on admission; this may be dilutional.  CT scan of the abdomen and pelvis with contrast today showed no evidence of aortic aneurysm rupture or other complicating features, and stable mild sigmoid diverticulitis without abscess (see full report.)  The patient's nausea and retching may be due to the diverticulitis, but the symptoms seem out of proportion to the clinical findings with regard to the diverticulitis.  Plans include continue empiric IV antibiotics for diverticulitis; continue antiemetics; cardiology has postponed nuclear medicine stress study until nausea and retching improve; vascular surgery is following and plans repair of aneurysm when able; agree with GI consult.  Dr. Beryle Beams will take over as attending physician tomorrow 06/08/2013.

## 2013-06-07 NOTE — Progress Notes (Signed)
    Subjective:  Nausea/vomiting overnight and this am. No other complaints.   Objective:  Vital Signs in the last 24 hours: Temp:  [98.3 F (36.8 C)-98.6 F (37 C)] 98.4 F (36.9 C) (05/25 0704) Pulse Rate:  [56-70] 56 (05/25 0704) Resp:  [18-19] 19 (05/25 0704) BP: (101-114)/(50-60) 114/60 mmHg (05/25 0704) SpO2:  [96 %-100 %] 96 % (05/25 0704)  Intake/Output from previous day:    Physical Exam: Pt is alert and oriented, NAD HEENT: normal, facial petechiae noted Neck: JVP - normal Lungs: CTA bilaterally CV: RRR with 2/6 systolic murmur at the LSB Abd: soft, diffuse tenderness, no rebound or guarding Ext: no C/C/E Skin: warm/dry no rash   Lab Results:  Recent Labs  06/06/13 0550 06/07/13 0519  WBC 8.3 5.6  HGB 9.6* 8.1*  PLT 123* 102*    Recent Labs  06/06/13 0550 06/07/13 0519  NA 142 138  K 3.8 3.4*  CL 104 105  CO2 21 20  GLUCOSE 137* 112*  BUN 18 23  CREATININE 1.35 1.28   No results found for this basename: TROPONINI, CK, MB,  in the last 72 hours   Assessment/Plan:  1. CAD with hx old inferior MI 2. AAA - new dx 5.8 cm 3. Acute diverticulitis with intractable nausea and vomiting 4. Tobacco abuse 5. Anemia  Reviewed notes. Discussed case with patient and wife who are well-known to me from outpatient cardiac care. Also discussed with Dr's Sadek and Joines. Pt to go for CTA today to evaluate the anatomy of his AAA. I am going to cancel his [employer] today as there is no way he can lay still for a nuclear scan with his frequent vomiting. Will reschedule for tomorrow am. If it turns out he has to go to surgery emergently, I would proceed without cardiac testing.    Sherren Mocha, M.D. 06/07/2013, 10:15 AM

## 2013-06-07 NOTE — Consult Note (Signed)
Referring Provider: Internal Medicine service Primary Care Physician:  Sherren Mocha, MD Primary Gastroenterologist:  None/unassigned  Reason for Consultation:  Diverticulitis, N/V  HPI: [name] is a 68 y.o. male admitted on 06/05/2013 after onset of sharp stabbing left lower quadrant pain, nausea and vomiting the day before. Patient has no previous GI history and has not had any prior GI evaluation. He does have history of coronary artery disease and is status post MI in 2010 with PCI of the RCA. He is maintained on aspirin. Patient states that he had been feeling fine prior to onset of his acute symptoms though he does have ongoing problems with heartburn indigestion and he is a lot of toms. He had not noted any changes in his bowel habits melena or hematochezia. His wife states he has had some recent spells of nausea and indigestion. He has had persistent nausea and intermittent dry heaves since admission. CT scan of the abdomen/pelvis on 06/05/2013 without IV contrast did show cholelithiasis, no wall thickening, and a 5.8 cm abdominal aortic aneurysm as well as multiple diverticuli with mild inflammatory changes in the descending and sigmoid colon no evidence of perforation or abscess He has just completed CT angiography today and has been evaluated by Dr. Trula Slade for vascular surgery. CT angiogram shows a 5.8 cm infrarenal abdominal aortic aneurysm without evidence for rupture or dissection. He also has left iliac stenosis and stable mild sigmoid diverticulitis. There is some splenomegaly and evidence for cholelithiasis. Patient has a normocytic anemia and hemoglobin has drifted since admission currently at 8.1 hematocrit of 29.6 platelets 112.  Plan is for stress testing per cardiology tomorrow.     Past Medical History  Diagnosis Date  . Coronary atherosclerosis of native coronary artery     a. 10/2008 inf STEMI/PCI: LM 50d (IVUS-borderline lesion->med rx), LAD min irregs, LCX  68m, 70d, OM nl, RCA 145m (3.5x28 Vision BMS);  b. 11/2008 Lexiscan MV: EF 65%, no isch/scar.  . Essential hypertension, benign   . Pure hypercholesterolemia   . AAA (abdominal aortic aneurysm)     a. 05/2013 CT: 5.8 cm AAA.  Marland Kitchen Diverticulitis     a. 05/2013 CT: descending/sigmoid jxn w/o abscess.  . Osteoarthritis   . Tobacco abuse     a. ongoing - 1ppd for better part of 50 yrs.  . Normocytic anemia     Past Surgical History  Procedure Laterality Date  . Cardiac stents  2010    Prior to Admission medications   Medication Sig Start Date End Date Taking? Authorizing Provider  aspirin 81 MG tablet Take 81 mg by mouth daily.     Yes Historical Provider, MD  fish oil-omega-3 fatty acids 1000 MG capsule Take 2 g by mouth daily.     Yes Historical Provider, MD  metoprolol tartrate (LOPRESSOR) 25 MG tablet Take 0.5 tablets (12.5 mg total) by mouth 2 (two) times daily. 10/13/12  Yes Sherren Mocha, MD  nitroGLYCERIN (NITROSTAT) 0.4 MG SL tablet Place 1 tablet (0.4 mg total) under the tongue every 5 (five) minutes as needed. 03/24/12  Yes Sherren Mocha, MD  ondansetron (ZOFRAN ODT) 4 MG disintegrating tablet Take 1 tablet (4 mg total) by mouth every 8 (eight) hours as needed for nausea. 06/05/13  Yes Johnna Acosta, MD  oxymetazoline (AFRIN) 0.05 % nasal spray Place 2 sprays into both nostrils 2 (two) times daily as needed for congestion.   Yes Historical Provider, MD  predniSONE (DELTASONE) 20 MG tablet Take 2 tablets (  40 mg total) by mouth daily. 06/05/13  Yes Johnna Acosta, MD  promethazine (PHENERGAN) 25 MG tablet Take 25 mg by mouth every 6 (six) hours as needed for nausea or vomiting.   Yes Historical Provider, MD  rosuvastatin (CRESTOR) 10 MG tablet Take 10 mg by mouth daily.   Yes Historical Provider, MD    Current Facility-Administered Medications  Medication Dose Route Frequency Provider Last Rate Last Dose  . 0.9 %  sodium chloride infusion   Intravenous Continuous Clinton Gallant, MD 150  mL/hr at 06/05/13 1733    . aspirin chewable tablet 81 mg  81 mg Oral Daily Serafina Mitchell, MD      . ciprofloxacin (CIPRO) IVPB 400 mg  400 mg Intravenous Q12H Clinton Gallant, MD   400 mg at 06/07/13 0848  . enoxaparin (LOVENOX) injection 40 mg  40 mg Subcutaneous Q24H Clinton Gallant, MD   40 mg at 06/06/13 2040  . metroNIDAZOLE (FLAGYL) IVPB 500 mg  500 mg Intravenous Q8H Duwaine Maxin, DO   500 mg at 06/07/13 0241  . morphine 2 MG/ML injection 1 mg  1 mg Intravenous Q3H PRN Clinton Gallant, MD   1 mg at 06/07/13 0859  . nicotine (NICODERM CQ - dosed in mg/24 hours) patch 14 mg  14 mg Transdermal Daily Clinton Gallant, MD   14 mg at 06/06/13 1055  . ondansetron (ZOFRAN) tablet 8 mg  8 mg Oral Q6H PRN Duwaine Maxin, DO       Or  . ondansetron (ZOFRAN) 8 mg/NS 50 ml IVPB  8 mg Intravenous Q6H PRN Duwaine Maxin, DO   8 mg at 06/07/13 1025  . pantoprazole (PROTONIX) injection 40 mg  40 mg Intravenous Q12H Clinton Gallant, MD   40 mg at 06/06/13 2338  . regadenoson (LEXISCAN) injection SOLN 0.4 mg  0.4 mg Intravenous Once Rogelia Mire, NP      . scopolamine (TRANSDERM-SCOP) 1 MG/3DAYS 1.5 mg  1 patch Transdermal Q72H Tanna Furry, MD   1.5 mg at 06/05/13 1321  . sodium chloride 0.9 % injection 3 mL  3 mL Intravenous Q12H Clinton Gallant, MD   3 mL at 06/06/13 1051    Allergies as of 06/05/2013  . (No Known Allergies)    Family History  Problem Relation Age of Onset  . Heart attack Brother   . Cancer Father   . Cancer Mother     History   Social History  . Marital Status: Married    Spouse Name: N/A    Number of Children: N/A  . Years of Education: N/A   Occupational History  . Not on file.   Social History Main Topics  . Smoking status: Heavy Tobacco Smoker -- 1.00 packs/day for 50 years    Types: Cigarettes    Last Attempt to Quit: 07/25/2010  . Smokeless tobacco: Never Used  . Alcohol Use: No  . Drug Use: No  . Sexual Activity: Not on file   Other Topics Concern  . Not on file   Social History  Narrative   Lives in [place] with wife.  Retired Furniture conservator/restorer.  Works out in the yard often - limited by claudication.  Does not routinely exercise.    Review of Systems: Pertinent positive and negative review of systems were noted in the above HPI section.  All other review of systems was otherwise negative.  Physical Exam: Vital signs in last 24 hours: Temp:  [98.3 F (36.8 C)-98.6 F (37 C)] 98.4 F (36.9  C) (05/25 0704) Pulse Rate:  [56-70] 56 (05/25 0704) Resp:  [18-19] 19 (05/25 0704) BP: (101-114)/(50-60) 114/60 mmHg (05/25 0704) SpO2:  [96 %-100 %] 96 % (05/25 0704) Last BM Date: 06/02/13 General:   Alert, well-developed, well-nourished, pleasant and cooperative in NAD Head:  Normocephalic and atraumatic. Eyes:  Sclera clear, no icterus. Conjunctiva pink. Ears:  Normal auditory acuity. Nose:  No deformity, discharge,  or lesions. Mouth:  No deformity or lesions.   Neck:  Supple; no masses or thyromegaly. Lungs:  Clear throughout to auscultation.  No wheezes, crackles, or rhonchi. Heart:  Regular rate and rhythm; no murmurs, clicks, rubs,  or gallops. Abdomen:  Soft, tender LLQ, no guarding or rebound, BS active, nonpalp mass or hsm.   Rectal:  Deferred  Msk:  Symmetrical without gross deformities. . Pulses:  Normal pulses noted. Extremities:  Without clubbing or edema. Neurologic:  Alert and  oriented x4;  grossly normal neurologically. Skin:  Intact without significant lesions or rashes.. Psych:  Alert and cooperative. Normal mood and affect.  Intake/Output from previous day:   Intake/Output this shift:    Lab Results:  Recent Labs  06/05/13 0415 06/06/13 0550 06/07/13 0519  WBC 7.6 8.3 5.6  HGB 10.2* 9.6* 8.1*  HCT 30.6* 28.9* 24.6*  PLT 129* 123* 102*   BMET  Recent Labs  06/05/13 1220 06/06/13 0550 06/07/13 0519  NA 143 142 138  K 4.2 3.8 3.4*  CL 104 104 105  CO2 27 21 20   GLUCOSE 104* 137* 112*  BUN 18 18 23   CREATININE 1.38* 1.35 1.28  CALCIUM  9.4 8.7 8.1*   LFT  Recent Labs  06/06/13 0550  PROT 7.5  ALBUMIN 4.1  AST 28  ALT 25  ALKPHOS 83  BILITOT 0.7   PT/INR  Recent Labs  06/05/13 2037  LABPROT 15.3*  INR 1.24     MPRESSION:  #2 68 year old white male with acute sigmoid diverticulitis associated with nausea and vomiting-no evidence for ileus or perforation on CT earlier today #2 chronic GERD #3 5.8 cm infrarenal abdominal aortic aneurysm-vascular surgery is following there is no evidence for dissection-he will need surgery once his diverticulitis has improved #4 coronary artery disease status post MI and stent 2010 #5 left iliac stenosis #6 normocytic anemia/and mild thrombocytopenia-question myelodysplastic disorder  PLAN: #1 start clear liquids #2 change Zofran to 8 mg IV q6h and give around-the-clock rather than when necessary #3 continue IV PPI twice daily #4 if nausea persists stop morphine and switch to another opioid #5 continue IV Cipro and IV Flagyl-generally a 14 day course of antibiotics as needed for diverticulitis but can be converted to oral meds once taking by mouth's #6 check anemia panel He will need eventual colonoscopy as an outpatient for screening once he has recovered from acute illness and abdominal aortic aneurysm repair   Amy S Esterwood  06/07/2013, 10:57 AM      Attending physician's note   I have taken a history, examined the patient and reviewed the chart. I agree with the Advanced Practitioner's note, impression and recommendations.  Acute diverticulitis with persistent N/V and a 5.8 cm AAA. Repeat CT today showed no changes. N/V related to acute illness and/or medications. Give Zofran 8 mg IV q6h around the clock for several days (not prn). Phenergan IV prn for breakthrough symptoms. Can add Ativan for additional control if needed. Change to Dilaudid or another opioid for pain control if N/V persists. 2 week course of antibiotics for diverticulitis.  Elective colonoscopy  several weeks after completing antibiotics. Please call if additional GI assistance is needed.  Ladene Artist, MD Marval Regal

## 2013-06-07 NOTE — Progress Notes (Signed)
Subjective: Patient reports continued N/V starting at 2AM that woke him from sleep.  He has been awake since then.  He says the Zofran and phenergan work about the same for his nausea but says the phenergan makes him feel restless.  He says the morphine is controlling the abd pain and helps him to sleep.  No flatus or BM since admission.  Last BM about 5 days ago.    Objective: Vital signs in last 24 hours: Filed Vitals:   06/06/13 0445 06/06/13 1612 06/06/13 2215 06/07/13 0704  BP: 106/48 113/50 101/56 114/60  Pulse: 65 70 56 56  Temp: 99.8 F (37.7 C) 98.6 F (37 C) 98.3 F (36.8 C) 98.4 F (36.9 C)  TempSrc: Oral Oral Oral Oral  Resp: 18 18 18 19   Height:      Weight:      SpO2: 98% 100% 100% 96%   Weight change:   Intake/Output Summary (Last 24 hours) at 06/07/13 0800 Last data filed at 06/06/13 1800  Gross per 24 hour  Intake      0 ml  Output      0 ml  Net      0 ml   General: sitting up on side of bed, NAD HEENT: no gross abnormality Cardiac: RRR, no rubs, murmurs or gallops Pulm: clear to auscultation bilaterally, moving normal volumes of air Abd: soft, nondistended, hypoactive BS, LLQ TTP Ext: warm and well perfused, no pedal edema Neuro: alert and oriented X3, responding appropriately, able to move all four extremities independently   Lab Results: Basic Metabolic Panel:  Recent Labs Lab 06/06/13 0550 06/07/13 0519  NA 142 138  K 3.8 3.4*  CL 104 105  CO2 21 20  GLUCOSE 137* 112*  BUN 18 23  CREATININE 1.35 1.28  CALCIUM 8.7 8.1*   Liver Function Tests:  Recent Labs Lab 06/05/13 0415 06/06/13 0550  AST 33 28  ALT 31 25  ALKPHOS 83 83  BILITOT 0.5 0.7  PROT 8.0 7.5  ALBUMIN 4.4 4.1    Recent Labs Lab 06/05/13 0415  LIPASE 51   CBC:  Recent Labs Lab 06/05/13 0415 06/06/13 0550 06/07/13 0519  WBC 7.6 8.3 5.6  NEUTROABS 5.3  --  4.0  HGB 10.2* 9.6* 8.1*  HCT 30.6* 28.9* 24.6*  MCV 90.0 90.6 90.1  PLT 129* 123* PENDING    Hemoglobin A1C:  Recent Labs Lab 06/05/13 2037  HGBA1C 5.6   Thyroid Function Tests:  Recent Labs Lab 06/05/13 2037  TSH 1.550   Coagulation:  Recent Labs Lab 06/05/13 2037  LABPROT 15.3*  INR 1.24   Urinalysis:  Recent Labs Lab 06/05/13 1724 06/06/13 0050  COLORURINE YELLOW YELLOW  LABSPEC 1.013 1.009  PHURINE 7.0 6.5  GLUCOSEU NEGATIVE NEGATIVE  HGBUR NEGATIVE NEGATIVE  BILIRUBINUR NEGATIVE NEGATIVE  KETONESUR NEGATIVE NEGATIVE  PROTEINUR NEGATIVE NEGATIVE  UROBILINOGEN 0.2 0.2  NITRITE NEGATIVE NEGATIVE  LEUKOCYTESUR NEGATIVE NEGATIVE   Studies/Results: Ct Abdomen Pelvis Wo Contrast  06/05/2013   CLINICAL DATA:  Abdominal pain and cramping.  Hematuria.  EXAM: CT ABDOMEN AND PELVIS WITHOUT CONTRAST  TECHNIQUE: Multidetector CT imaging of the abdomen and pelvis was performed following the standard protocol without IV contrast.  COMPARISON:  None.  FINDINGS: Minimal dependent atelectasis in the visualized lung bases. Coronary calcification. Unremarkable liver, spleen, right adrenal gland, pancreas. Unenhanced CT was performed per clinician order. Lack of IV contrast limits sensitivity and specificity, especially for evaluation of abdominal/pelvic solid viscera.  At least  2 subcentimeter partially calcified stones layer in the dependent aspect of the nondilated gallbladder. 15 mm low-attenuation left adrenal nodule, probably benign adenomas in the absence of history of primary carcinoma. . Small probable cysts in the upper pole left kidney and from the lower pole right kidney. No hydronephrosis.  Fusiform distal aortic aneurysm measuring up to 5.8 cm maximum transverse diameter, tapering to a diameter of 3.6 cm at the bifurcation. Iliac arteries are atheromatous but non aneurysmal. There is no evidence of retroperitoneal hemorrhage nor hyperdense crescent to suggest impending rupture.  Stomach, small bowel, colon are nondilated. Multiple descending and sigmoid  diverticula, with some mild inflammatory/edematous changes around the descending/sigmoid junction. No abscess. No free air. No ascites. Urinary bladder physiologically distended. Moderate prostatic enlargement with central coarse calcifications. No adenopathy.  IMPRESSION: 1. Diverticulitis at the descending/sigmoid junction without abscess. 2. 5.8 cm abdominal aortic aneurysm. Vascular surgery consultation recommended due to increased risk of rupture for AAA >5.5 cm. This recommendation follows ACR consensus. This recommendation follows ACR consensus guidelines: White Paper of the ACR Incidental Findings Committee II on Vascular Findings. J Am Coll Radiol 2013; 10:789-794.   Electronically Signed   By: Arne Cleveland M.D.   On: 06/05/2013 12:52   Medications: I have reviewed the patient's current medications. Scheduled Meds: . aspirin  81 mg Oral Daily  . ciprofloxacin  400 mg Intravenous Q12H  . enoxaparin (LOVENOX) injection  40 mg Subcutaneous Q24H  . metronidazole  500 mg Intravenous Q8H  . nicotine  14 mg Transdermal Daily  . pantoprazole (PROTONIX) IV  40 mg Intravenous Q12H  . regadenoson  0.4 mg Intravenous Once  . scopolamine  1 patch Transdermal Q72H  . sodium chloride  3 mL Intravenous Q12H   Continuous Infusions: . sodium chloride 150 mL/hr at 06/05/13 1733   PRN Meds:.morphine injection, ondansetron (ZOFRAN) IV, ondansetron  Assessment/Plan: Diverticulitis: abd pain, N/V x no fever x 4 days, LLQ TTP; CT w/o contrast reveals diverticulitis at the descending/sigmoid junction without abscess.  Morphine is controlling pain.  His N/V has not responded to Reglan, Phenergan or Zofran (8mg ).  It is very important to get his N/V controlled to avoid increased abd pressure given AAA of 5.8cm.  He has not had BM in several days and reports no flatus. - stat CTA chest/abd/pelvis (r/o acute abdominal process and evaluate anatomy for repair) - consult to GI for assistance with managing  intractable vomiting - continue IV cipro and flagyl  - zofran 8mg  q6h for nausea - morphine for pain  - NPO  - NSS at 150cc/hr, plus bolus prior to CT - monitor CBC   AAA: CT abdomen reveals 5.8cm AAA. Has ~ 30 pack year smoking history. No signs of leak or rupture but > 5.5cm requires surgical repair. Vascular surgery consulted and do not feel the patient's symptoms are related to AAA. They would like CT chest,/abdomen/pelvis with contrast once AKI resolved. Likely OR on 05/26.  Appreciate cardiology and vascular surgery recommendations. - stat CTA chest and abd/pelvis with contrast to evaluate anatomy for repair and r/o acute abdominal process as above - carotid dopplers, ABI and lower extremity doppler results pending - urgent repair should he decompensate - pre-op lexiscan myoview once N/V/abd pain resolve (plan for 05/26) - risk stratification - encourage smoking cessation; 05/2013 LDL 38 and patient is on Crestor  - continue ASA, fish oil, Crestor once patient resumes po   Anemia:  hgb 10.2 >> 8.1 this admission.  No gross blood  loss noted.  May be a component of dilution given concurrent drop in other heme lines but concerned this may related to AAA. - monitor CBC twice daily - transfuse < 8.0  Hypokalemia:  K 3.4.  Vomiting is most likely etiology. - replete with IV KCl x 4 runs - monitor BMP  AKI, improving: Baseline Cr 1.3. Creatinine at admission 1.41>>>1.28 with hydration. - continue IV fluids - bolus prior to CT - monitor BMP  Tobacco use disorder: ~ 30 pack year history. Likely contributed to AAA above. He has quit successfully in the past for 8 months.  - nicotine patch  - encourage smoking cessation   CAD/HLD/HTN: No chest pain or EKG changes. Trop negative. BP stable.  - hold ASA, fish oil, Lopressor, Crestor while NPO   Diet: NPO  VTE ppx: Lovenox [place]  Code: Full  Dispo: Disposition is deferred at this time, awaiting improvement of current medical problems.   Anticipated discharge in approximately 2-3 day(s).   The patient does have a current PCP Sherren Mocha, MD) and does not need an [facility] hospital follow-up appointment after discharge.  The patient does not know have transportation limitations that hinder transportation to clinic appointments.  .Services Needed at time of discharge: Y = Yes, Blank = No PT:   OT:   RN:   Equipment:   Other:     LOS: 2 days   Duwaine Maxin, DO 06/07/2013, 8:00 AM

## 2013-06-07 NOTE — Telephone Encounter (Signed)
Pt seen in hospital.

## 2013-06-07 NOTE — Progress Notes (Signed)
Nutrition Brief Note  Patient identified on the Malnutrition Screening Tool (MST) Report  Wt Readings from Last 15 Encounters:  06/05/13 199 lb (90.266 kg)  06/05/13 201 lb (91.173 kg)  05/25/13 201 lb 12.8 oz (91.536 kg)  10/13/12 211 lb (95.709 kg)  03/24/12 205 lb 6.4 oz (93.169 kg)  12/25/10 205 lb (92.987 kg)  05/02/09 208 lb (94.348 kg)  01/30/09 203 lb (92.08 kg)  12/20/08 199 lb 4 oz (90.379 kg)  11/15/08 195 lb (88.451 kg)  11/15/08 195 lb (88.451 kg)    Body mass index is 28.55 kg/(m^2). Patient meets criteria for overweight based on current BMI.   Current diet order is NPO due to nausea and vomiting. Labs and medications reviewed.   Pt admitted with nausea and vomiting x4 days, came to ED when abdominal pain worsened. Pt underwent CT this AM.  RD met with pt and wife who reports eating per his usual prior to onset of N/V.  Wife reports ~10 lbs of weight change with increased activity level and decreased soda intake.  No nutrition interventions warranted at this time, however pt has been NPO x4 days. If nutrition issues arise, please consult RD. Will continue to follow for diet advancement and resolve of N/V.  Brynda Greathouse, MS RD LDN Clinical Inpatient Dietitian Pager: [phone] Weekend/After hours pager: [phone]

## 2013-06-07 NOTE — Progress Notes (Signed)
Spoke with wife on telephone and discussed results of CT scan.  Pt is a candidate for endovascular repair of his AAA.  I will wait until he clears his GI issues before proceeding.  Annamarie Major

## 2013-06-07 NOTE — Progress Notes (Addendum)
Subjective    The patient states that he was able to walk around yesterday and fell that he was getting better, however overnight he continued to have nausea with minimal emesis.  He states that he has abdominal pain in the left lower quadrant.   Physical Exam:  Gen.: In no acute distress. Cardiovascular: Regular rate and rhythm Abdomen: Tenderness is in the midline and down to the left lower quadrant.  His abdomen is nondistended and soft. Extremities are warm and well perfused Neuro: Nonfocal exam       Assessment/Plan:  Diverticulitis and abdominal aortic aneurysm  The patient's persistent nausea and left lower quadrant tenderness points to his primary problem being diverticulitis.  However, because he has not significantly improved with IV fluids and antibiotics, I would like to go ahead and obtain a CT angiogram to better define his aorta and to see if there has been any progression in the diverticulitis.  If there is no evidence suggesting that the aneurysm is causing his problems by CT scan, I would not recommend repair tomorrow as originally planned, because the patient is still suffering from nausea likely related to his diverticulitis.  I feel that repairing his aneurysm in the setting of acute diverticulitis would put him at unnecessary risk for infection of his endograft.  He has received adequate hydration and his creatinine has slightly trended down.  I feel that his clinical condition supports getting his CT angiogram today.  The patient's hematocrit has trended down over his admission.  I suspect that this is secondary to volume resuscitation.  His CT scan will help determine whether or not this is related to his aneurysm  [name] 06/07/2013 10:12 AM --  Danley Danker Vitals:   06/07/13 0704  BP: 114/60  Pulse: 56  Temp: 98.4 F (36.9 C)  Resp: 19    Intake/Output Summary (Last 24 hours) at 06/07/13 1012 Last data filed at 06/06/13 1800  Gross per 24 hour    Intake      0 ml  Output      0 ml  Net      0 ml     Laboratory CBC    Component Value Date/Time   WBC 5.6 06/07/2013 0519   HGB 8.1* 06/07/2013 0519   HCT 24.6* 06/07/2013 0519   PLT 102* 06/07/2013 0519    BMET    Component Value Date/Time   NA 138 06/07/2013 0519   K 3.4* 06/07/2013 0519   CL 105 06/07/2013 0519   CO2 20 06/07/2013 0519   GLUCOSE 112* 06/07/2013 0519   BUN 23 06/07/2013 0519   CREATININE 1.28 06/07/2013 0519   CALCIUM 8.1* 06/07/2013 0519   GFRNONAA 57* 06/07/2013 0519   GFRAA 66* 06/07/2013 0519    COAG Lab Results  Component Value Date   INR 1.24 06/05/2013   INR 1.66* 10/20/2008   No results found for this basename: PTT    Antibiotics Anti-infectives   Start     Dose/Rate Route Frequency Ordered Stop   06/06/13 1800  metroNIDAZOLE (FLAGYL) IVPB 500 mg     500 mg 100 mL/hr over 60 Minutes Intravenous Every 8 hours 06/06/13 1708     06/06/13 0815  ciprofloxacin (CIPRO) IVPB 400 mg     400 mg 200 mL/hr over 60 Minutes Intravenous Every 12 hours 06/06/13 0806     06/06/13 0240  ciprofloxacin (CIPRO) IVPB 400 mg     400 mg 200 mL/hr over 60 Minutes Intravenous  Once 06/05/13 1839 06/06/13 0257   06/05/13 1315  metroNIDAZOLE (FLAGYL) IVPB 500 mg     500 mg 100 mL/hr over 60 Minutes Intravenous  Once 06/05/13 1307 06/05/13 1722   06/05/13 1315  ciprofloxacin (CIPRO) IVPB 400 mg     400 mg 200 mL/hr over 60 Minutes Intravenous  Once 06/05/13 1307 06/05/13 1549       V. Leia Alf, M.D. [facility] Office: [phone] Pager:  [phone]

## 2013-06-07 NOTE — Progress Notes (Addendum)
Vascular Lab Preliminary  Pre OP AAA surgery.  Carotid Doppler = Bilateral:  1-39% ICA stenosis.  Vertebral artery flow is antegrade.    Lower extremity arterial duplex =  Right: Triphasic flow noted throughout right leg to pedal arteries. Elevated velocities at mid FA consistent with 20-49% stenosis. Left = No obvious stenosis noted. Monophasic flow throughout left leg, suggesting proximal obstruction/stenosis. ---refer to ABI study from 06/06/13.   Landry Mellow, RDMS, RVT 06/07/2013

## 2013-06-08 DIAGNOSIS — D696 Thrombocytopenia, unspecified: Secondary | ICD-10-CM

## 2013-06-08 DIAGNOSIS — R161 Splenomegaly, not elsewhere classified: Secondary | ICD-10-CM

## 2013-06-08 DIAGNOSIS — R079 Chest pain, unspecified: Secondary | ICD-10-CM

## 2013-06-08 DIAGNOSIS — R109 Unspecified abdominal pain: Secondary | ICD-10-CM

## 2013-06-08 LAB — BASIC METABOLIC PANEL
BUN: 20 mg/dL (ref 6–23)
CHLORIDE: 106 meq/L (ref 96–112)
CO2: 22 mEq/L (ref 19–32)
Calcium: 7.8 mg/dL — ABNORMAL LOW (ref 8.4–10.5)
Creatinine, Ser: 1.26 mg/dL (ref 0.50–1.35)
GFR, EST AFRICAN AMERICAN: 67 mL/min — AB (ref >90)
GFR, EST NON AFRICAN AMERICAN: 58 mL/min — AB (ref >90)
Glucose, Bld: 117 mg/dL — ABNORMAL HIGH (ref 70–99)
POTASSIUM: 3.5 meq/L — AB (ref 3.7–5.3)
SODIUM: 140 meq/L (ref 137–147)

## 2013-06-08 LAB — CBC
HEMATOCRIT: 25.4 % — AB (ref 39.0–52.0)
HEMATOCRIT: 24.2 % — AB (ref 39.0–52.0)
Hemoglobin: 8.4 g/dL — ABNORMAL LOW (ref 13.0–17.0)
Hemoglobin: 8.1 g/dL — ABNORMAL LOW (ref 13.0–17.0)
MCH: 30 pg (ref 26.0–34.0)
MCH: 29.9 pg (ref 26.0–34.0)
MCHC: 33.1 g/dL (ref 30.0–36.0)
MCHC: 33.5 g/dL (ref 30.0–36.0)
MCV: 90.7 fL (ref 78.0–100.0)
MCV: 89.3 fL (ref 78.0–100.0)
Platelets: 115 10*3/uL — ABNORMAL LOW (ref 150–400)
Platelets: 115 10*3/uL — ABNORMAL LOW (ref 150–400)
RBC: 2.8 MIL/uL — ABNORMAL LOW (ref 4.22–5.81)
RBC: 2.71 MIL/uL — ABNORMAL LOW (ref 4.22–5.81)
RDW: 20.6 % — AB (ref 11.5–15.5)
RDW: 20 % — ABNORMAL HIGH (ref 11.5–15.5)
WBC: 6.1 10*3/uL (ref 4.0–10.5)
WBC: 6.2 10*3/uL (ref 4.0–10.5)

## 2013-06-08 LAB — HAPTOGLOBIN: Haptoglobin: 223 mg/dL — ABNORMAL HIGH (ref 45–215)

## 2013-06-08 LAB — SAVE SMEAR

## 2013-06-08 LAB — LACTATE DEHYDROGENASE: LDH: 213 U/L (ref 94–250)

## 2013-06-08 LAB — TROPONIN I: Troponin I: <0.3 ng/mL (ref <0.30)

## 2013-06-08 MED ORDER — POTASSIUM CHLORIDE 10 MEQ/100ML IV SOLN
10.0000 meq | INTRAVENOUS | Status: AC
  Administered 2013-06-08 (×4): 10 meq via INTRAVENOUS
  Filled 2013-06-08 (×4): qty 100

## 2013-06-08 NOTE — Progress Notes (Addendum)
Subjective: No acute events overnight. Slept well but awoke at 6AM with N/V/abd pain.  Says he has vomited several times but none in the past hour.  Got Zofran, phenergan.  Also reports central chest pressure, denies radiation to the back.  Says it has been constant since 6AM as well.    Objective: Vital signs in last 24 hours: Filed Vitals:   06/07/13 0704 06/07/13 1300 06/07/13 2018 06/08/13 0457  BP: 114/60 116/64 124/61 125/64  Pulse: 56 55 50 61  Temp: 98.4 F (36.9 C) 98.6 F (37 C) 98.3 F (36.8 C) 97.9 F (36.6 C)  TempSrc: Oral     Resp: 19 18 18 18   Height:      Weight:      SpO2: 96% 100% 97% 100%   Weight change:   Intake/Output Summary (Last 24 hours) at 06/08/13 0957 Last data filed at 06/08/13 0650  Gross per 24 hour  Intake   4430 ml  Output    500 ml  Net   3930 ml   General: laying in bed in NAD HEENT: no gross abnormality Cardiac: RRR, no rubs, gallops, + 2/6 systolic murmur Pulm: clear to auscultation bilaterally, moving normal volumes of air Abd: soft, nondistended, hypoactive BS, LLQ and epigastric TTP Ext: warm and well perfused, no pedal edema, SCDs in place Neuro: alert and oriented X3, responding appropriately, able to move all four extremities independently   Lab Results: Basic Metabolic Panel:  Recent Labs Lab 06/07/13 0519 06/08/13 0840  NA 138 140  K 3.4* 3.5*  CL 105 106  CO2 20 22  GLUCOSE 112* 117*  BUN 23 20  CREATININE 1.28 1.26  CALCIUM 8.1* 7.8*   Liver Function Tests:  Recent Labs Lab 06/05/13 0415 06/06/13 0550  AST 33 28  ALT 31 25  ALKPHOS 83 83  BILITOT 0.5 0.7  PROT 8.0 7.5  ALBUMIN 4.4 4.1    Recent Labs Lab 06/05/13 0415  LIPASE 51   CBC:  Recent Labs Lab 06/05/13 0415  06/07/13 0519 06/07/13 1656 06/08/13 0840  WBC 7.6  < > 5.6 5.8 6.1  NEUTROABS 5.3  --  4.0  --   --   HGB 10.2*  < > 8.1* 8.2* 8.4*  HCT 30.6*  < > 24.6* 24.6* 25.4*  MCV 90.0  < > 90.1 90.1 90.7  PLT 129*  < > 102*  114* 115*  < > = values in this interval not displayed. Hemoglobin A1C:  Recent Labs Lab 06/05/13 2037  HGBA1C 5.6   Thyroid Function Tests:  Recent Labs Lab 06/05/13 2037  TSH 1.550   Coagulation:  Recent Labs Lab 06/05/13 2037  LABPROT 15.3*  INR 1.24   Urinalysis:  Recent Labs Lab 06/05/13 1724 06/06/13 0050  COLORURINE YELLOW YELLOW  LABSPEC 1.013 1.009  PHURINE 7.0 6.5  GLUCOSEU NEGATIVE NEGATIVE  HGBUR NEGATIVE NEGATIVE  BILIRUBINUR NEGATIVE NEGATIVE  KETONESUR NEGATIVE NEGATIVE  PROTEINUR NEGATIVE NEGATIVE  UROBILINOGEN 0.2 0.2  NITRITE NEGATIVE NEGATIVE  LEUKOCYTESUR NEGATIVE NEGATIVE   Studies/Results: Ct Angio Chest Aortic Dissect W &/or W/o  06/07/2013   CLINICAL DATA:  Aortic aneurysm  EXAM: CT ANGIOGRAPHY CHEST, ABDOMEN AND PELVIS  TECHNIQUE: Multidetector CT imaging through the chest, abdomen and pelvis was performed using the standard protocol during bolus administration of intravenous contrast. Multiplanar reconstructed images and MIPs were obtained and reviewed to evaluate the vascular anatomy.  CONTRAST:  190mL OMNIPAQUE IOHEXOL 350 MG/ML SOLN  COMPARISON:  06/05/2013  FINDINGS: CTA  CHEST FINDINGS  No precontrast images were done. Patchy coronary, aortic arch, and descending thoracic aortic calcifications. Adequate contrast opacification of the thoracic aorta with no evidence of dissection, aneurysm, or stenosis. There is classic 3-vessel brachiocephalic arch anatomy without proximal stenosis. Good contrast opacification of pulmonary artery branches; the exam was not optimized for detection of pulmonary emboli. No pleural or pericardial effusion. Borderline enlarged AP window, prevascular, right hilar, and precarinal lymph nodes measuring up to the 11 mm short axis diameter. Subpleural blebs in both lung apices. Minimal dependent atelectasis posteriorly in both lower lobes with some patchy airspace consolidation or subsegmental atelectasis  posterolaterally at the right lung base. Thoracic spine and sternum intact.  Review of the MIP images confirms the above findings.  CTA ABDOMEN AND PELVIS FINDINGS  Arterial findings:  Aorta: Mild scattered calcified plaque in the suprarenal segment. Fusiform infrarenal aneurysm, 5.8 x 5.6 cm maximum transverse dimensions, tapering to a diameter of 3.4 cm at the bifurcation. Short infrarenal neck less than 3 cm. There is a large amount of intraluminal mural thrombus in the aneurysmal segment. No dissection or stenosis.  Celiac axis:         Patent.  Superior mesenteric: Patent, with replaced right hepatic arterial supply, an anatomic variant.  Left renal:          Single, patent.  Right renal:         Single, patent.  Inferior mesenteric: Short segment origin occlusion, reconstituted distally by visceral collaterals.  Left iliac: Eccentric calcified plaque through the common iliac artery. There is a short segment of at least 50% diameter stenosis at the origin of the common iliac artery. There is a web-like stenosis at the origin of the external iliac artery resulting greater than 50% diameter narrowing, with some mild plaque in the more distal external iliac artery but no tandem stenotic lesion. Eccentric plaque in the internal iliac artery which is patent. No aneurysm.  Right iliac: Calcified plaque throughout the common iliac artery without high-grade stenosis or aneurysm. There is mild eccentric plaque in the proximal internal iliac artery. External iliac widely patent. No aneurysm or dissection.  Venous findings: Dedicated venous phase imaging not obtained. Patent portal vein, splenic vein, bilateral renal veins.  Review of the MIP images confirms the above findings.  Nonvascular findings: Unremarkable arterial phase evaluation of liver. Spleen mildly enlarged, 16.2 cm maximum length. 19 mm left adrenal nodule. Pancreas and right adrenal gland unremarkable. Several subcentimeter partially calcified stones layer  in the dependent aspect of the gallbladder without wall thickening or enhancement. Small bilateral renal cysts. No hydronephrosis. Stomach, small bowel, and colon are nondilated. Innumerable distal descending and sigmoid diverticula. Persistent mild inflammatory change around the proximal sigmoid colon. No abscess. No free air. Urinary bladder is physiologically distended. Moderate prostatic enlargement with central coarse calcifications. No ascites. No adenopathy. Advanced degenerative disc disease L5-S1 with posterior protrusion.  IMPRESSION: 1. 5.8 cm fusiform infrarenal abdominal aortic aneurysm without evidence of rupture or other complicating features. Vascular surgery consultation recommended due to increased risk of rupture for AAA >5.5 cm. This recommendation follows ACR consensus. This recommendation follows ACR consensus guidelines: White Paper of the ACR Incidental Findings Committee II on Vascular Findings. J Am Coll Radiol 2013; 10:789-794. 2. Tandem stenoses in the proximal and distal left common iliac artery. 3. Nonspecific right hilar and mediastinal adenopathy. 4. Patchy airspace infiltrate in the posterolateral right lower lobe. 5. Splenomegaly 6. Cholelithiasis 7. Stable left adrenal nodule, probably benign adenoma in the  absence of a history of primary carcinoma. 8. Stable mild sigmoid diverticulitis without abscess.   Electronically Signed   By: Arne Cleveland M.D.   On: 06/07/2013 11:27   Ct Angio Abd/pel W/ And/or W/o  06/07/2013   CLINICAL DATA:  Aortic aneurysm  EXAM: CT ANGIOGRAPHY CHEST, ABDOMEN AND PELVIS  TECHNIQUE: Multidetector CT imaging through the chest, abdomen and pelvis was performed using the standard protocol during bolus administration of intravenous contrast. Multiplanar reconstructed images and MIPs were obtained and reviewed to evaluate the vascular anatomy.  CONTRAST:  129mL OMNIPAQUE IOHEXOL 350 MG/ML SOLN  COMPARISON:  06/05/2013  FINDINGS: CTA CHEST FINDINGS  No  precontrast images were done. Patchy coronary, aortic arch, and descending thoracic aortic calcifications. Adequate contrast opacification of the thoracic aorta with no evidence of dissection, aneurysm, or stenosis. There is classic 3-vessel brachiocephalic arch anatomy without proximal stenosis. Good contrast opacification of pulmonary artery branches; the exam was not optimized for detection of pulmonary emboli. No pleural or pericardial effusion. Borderline enlarged AP window, prevascular, right hilar, and precarinal lymph nodes measuring up to the 11 mm short axis diameter. Subpleural blebs in both lung apices. Minimal dependent atelectasis posteriorly in both lower lobes with some patchy airspace consolidation or subsegmental atelectasis posterolaterally at the right lung base. Thoracic spine and sternum intact.  Review of the MIP images confirms the above findings.  CTA ABDOMEN AND PELVIS FINDINGS  Arterial findings:  Aorta: Mild scattered calcified plaque in the suprarenal segment. Fusiform infrarenal aneurysm, 5.8 x 5.6 cm maximum transverse dimensions, tapering to a diameter of 3.4 cm at the bifurcation. Short infrarenal neck less than 3 cm. There is a large amount of intraluminal mural thrombus in the aneurysmal segment. No dissection or stenosis.  Celiac axis:         Patent.  Superior mesenteric: Patent, with replaced right hepatic arterial supply, an anatomic variant.  Left renal:          Single, patent.  Right renal:         Single, patent.  Inferior mesenteric: Short segment origin occlusion, reconstituted distally by visceral collaterals.  Left iliac: Eccentric calcified plaque through the common iliac artery. There is a short segment of at least 50% diameter stenosis at the origin of the common iliac artery. There is a web-like stenosis at the origin of the external iliac artery resulting greater than 50% diameter narrowing, with some mild plaque in the more distal external iliac artery but no  tandem stenotic lesion. Eccentric plaque in the internal iliac artery which is patent. No aneurysm.  Right iliac: Calcified plaque throughout the common iliac artery without high-grade stenosis or aneurysm. There is mild eccentric plaque in the proximal internal iliac artery. External iliac widely patent. No aneurysm or dissection.  Venous findings: Dedicated venous phase imaging not obtained. Patent portal vein, splenic vein, bilateral renal veins.  Review of the MIP images confirms the above findings.  Nonvascular findings: Unremarkable arterial phase evaluation of liver. Spleen mildly enlarged, 16.2 cm maximum length. 19 mm left adrenal nodule. Pancreas and right adrenal gland unremarkable. Several subcentimeter partially calcified stones layer in the dependent aspect of the gallbladder without wall thickening or enhancement. Small bilateral renal cysts. No hydronephrosis. Stomach, small bowel, and colon are nondilated. Innumerable distal descending and sigmoid diverticula. Persistent mild inflammatory change around the proximal sigmoid colon. No abscess. No free air. Urinary bladder is physiologically distended. Moderate prostatic enlargement with central coarse calcifications. No ascites. No adenopathy. Advanced degenerative disc disease  L5-S1 with posterior protrusion.  IMPRESSION: 1. 5.8 cm fusiform infrarenal abdominal aortic aneurysm without evidence of rupture or other complicating features. Vascular surgery consultation recommended due to increased risk of rupture for AAA >5.5 cm. This recommendation follows ACR consensus. This recommendation follows ACR consensus guidelines: White Paper of the ACR Incidental Findings Committee II on Vascular Findings. J Am Coll Radiol 2013; 10:789-794. 2. Tandem stenoses in the proximal and distal left common iliac artery. 3. Nonspecific right hilar and mediastinal adenopathy. 4. Patchy airspace infiltrate in the posterolateral right lower lobe. 5. Splenomegaly 6.  Cholelithiasis 7. Stable left adrenal nodule, probably benign adenoma in the absence of a history of primary carcinoma. 8. Stable mild sigmoid diverticulitis without abscess.   Electronically Signed   By: Arne Cleveland M.D.   On: 06/07/2013 11:27   Medications: I have reviewed the patient's current medications. Scheduled Meds: . aspirin  81 mg Oral Daily  . enoxaparin (LOVENOX) injection  40 mg Subcutaneous Q24H  . nicotine  14 mg Transdermal Daily  . ondansetron (ZOFRAN) IV  8 mg Intravenous 4 times per day  . pantoprazole (PROTONIX) IV  40 mg Intravenous Q12H  . potassium chloride  10 mEq Intravenous Q1 Hr x 4  . regadenoson  0.4 mg Intravenous Once  . sodium chloride  3 mL Intravenous Q12H   Continuous Infusions: . sodium chloride 150 mL/hr at 06/07/13 1941   PRN Meds:.HYDROmorphone (DILAUDID) injection, LORazepam, promethazine  Assessment/Plan: N/V/abdominal pain, possibly 2/2 diverticulitis: abd pain, N/V x no fever x 4 days, LLQ TTP; CT w/o contrast reveals diverticulitis at the descending/sigmoid junction without abscess.  Morphine is controlling pain.  His N/V has not responded to Reglan, Phenergan or Zofran (8mg ).  It is very important to get his N/V controlled to avoid increased abd pressure given AAA of 5.8cm.  GI consulted for assistance with managing intractable vomiting and recommendations appreciated.  Patient switched to scheduled Zofran with Phenergan and Ativan prn for breakthrough nausea, morphine switched to dilaudid.  CTA chest/abd/pelvis did not reveal an etiology for intractable vomiting, shows stable mild diverticulitis w/o abscess and 5.8cm AAA w/o evidence or rupture or complicating features.  Able to sleep overnight, but N/V/pain returned this AM.  - d/c cipro and flagyl and see if N/V improves  - zofran 8mg  q6h for nausea - dilaudid for pain  - clear liquid diet - NSS at 150cc/hr - monitor CBC   AAA: CT abdomen reveals 5.8cm AAA. Has ~ 30 pack year smoking  history. No signs of leak or rupture but > 5.5cm requires surgical repair. Vascular surgery consulted and do not feel the patient's symptoms are related to AAA. They would like CT chest,/abdomen/pelvis with contrast once AKI resolved. Likely OR on 05/26.  Appreciate cardiology and vascular surgery recommendations. - urgent repair should he decompensate - pre-op lexiscan myoview once N/V/abd pain resolve - risk stratification - encourage smoking cessation; 05/2013 LDL 38 and patient is on Crestor  - resume ASA, Crestor  Chest pain:  Central chest discomfort.  No radiation, no back pain.  Has been present for several hours.  Stat EKG shows NSR, 60bpm, no acute ischemic findings, similar to prior.  Pain is likely 2/2 retching/vomiting.  Cardiology is following. - CE x 3 (1st one negative)  Anemia/thrombocytopenia/splenomegaly:  hgb 10.2 >> 8.4 this admission.  No gross blood loss noted.  MCV normal.   - check LDH, haptoglobin, peripheral smear - will need further evaluation after resolution of acute illness - monitor CBC  twice daily - transfuse < 8.0  Hypokalemia:  K 3.4.  Vomiting is most likely etiology. - replete with IV KCl x 4 runs - monitor BMP  AKI, improving: Baseline Cr 1.3. Creatinine at admission 1.41>>>1.26 with hydration. - continue IV fluids  - monitor BMP  Tobacco use disorder: ~ 30 pack year history. Likely contributed to AAA above. He has quit successfully in the past for 8 months.  - nicotine patch  - encourage smoking cessation   CAD/HLD/HTN: No chest pain or EKG changes. Trop negative. BP stable.  - resume ASA,Crestor now that he is taking po - hold Lopressor in the setting of normotension  Diet: clear liquid   VTE ppx: Lovenox [place]  Code: Full  Dispo: Disposition is deferred at this time, awaiting improvement of current medical problems.  Anticipated discharge in approximately 2-3 day(s).   The patient does have a current PCP Sherren Mocha, MD) and does not need  an [facility] hospital follow-up appointment after discharge.  The patient does not know have transportation limitations that hinder transportation to clinic appointments.  .Services Needed at time of discharge: Y = Yes, Blank = No PT:   OT:   RN:   Equipment:   Other:     LOS: 3 days   Duwaine Maxin, DO 06/08/2013, 9:57 AM

## 2013-06-08 NOTE — Progress Notes (Signed)
Patient ID: [name], male   DOB: [birth year], 68 y.o.   MRN: [id number] Attending Physician Note Patient interviewed and examined. Management discussed with resident physician Dr Duwaine Maxin. 68 y/o/ man with multiple issues: 1. 5-6 day hx of refractory nausea, vomiting, constipation and abdominal pain without fever. Sx started coincidental with eating [place] food. Wife ate same food - did not get sick. CT abdomen with non obstructing gallstones, "mild" diverticulitis. Pt sxs disproportionate to CT findings. Abdomen mildly, diffusely tender with absent bowel sounds.  Spleen enlarged on scan but not by my exam. I could not appreciate a pulsatile mass or bruit. Possibility this represents food poisoning. I am not convinced he has diverticulitis.I am going to hold antibiotics for now since they may be contributing to ongoing nausea/vomiting.. Continue symptomatic Rx. 2. 5.8 cm AAA with mural thrombus Will need to wait until GI stable before further definitive Rx. No leak of blood on angiography. 3. Normochromic anemia, splenomegaly, mild thrombocytopenia; borderline mediastinal adenopathy. Will need further eval when stable.  4. CAD  S/P MI 2010 He is having some intermittent, non-ischemic quality chest discomfort likely related to refractory vomiting.  I reviewed today's EKG which shows chronic sinus bradycardia, no acute ischemic changes.  We will check troponins. Cardiology consulting.

## 2013-06-08 NOTE — Progress Notes (Signed)
    Subjective  -  A little better today Walked yesterday Still with nausea, but improved with pain med change   Physical Exam:  abd soft, tenderness unchanged CV:  RRR Extremities warm       Assessment/Plan:    Cardiac workup in progress Consider GI consult if nausea remains AAA repair once cardiac clearance obtained and patient symptoms resolved  Serafina Mitchell 06/08/2013 5:05 PM --  Danley Danker Vitals:   06/08/13 1605  BP: 101/58  Pulse: 89  Temp:   Resp: 23    Intake/Output Summary (Last 24 hours) at 06/08/13 1705 Last data filed at 06/08/13 1600  Gross per 24 hour  Intake   3375 ml  Output   1590 ml  Net   1785 ml     Laboratory CBC    Component Value Date/Time   WBC 13.1* 06/08/2013 1334   HGB 12.7* 06/08/2013 1334   HCT 38.1* 06/08/2013 1334   PLT 151 06/08/2013 1334    BMET    Component Value Date/Time   NA 141 06/07/2013 0525   K 4.0 06/07/2013 0525   CL 108 06/07/2013 0525   CO2 17* 06/07/2013 0525   GLUCOSE 95 06/07/2013 0525   BUN 11 06/07/2013 0525   CREATININE 1.16 06/07/2013 0525   CALCIUM 7.8* 06/07/2013 0525   GFRNONAA 85* 06/07/2013 0525   GFRAA >90 06/07/2013 0525    COAG Lab Results  Component Value Date   INR 1.06 06/07/2013   No results found for this basename: PTT    Antibiotics Anti-infectives   None       V. Leia Alf, M.D. [facility] Office: [phone] Pager:  [phone]

## 2013-06-08 NOTE — Progress Notes (Signed)
Patient Name: [name] Date of Encounter: 06/08/2013     Principal Problem:   Diverticulitis Active Problems:   AAA (abdominal aortic aneurysm) without rupture   Nausea with vomiting    SUBJECTIVE  Patient is still having dry heaves and states he would not be able to lie still for the myoview. His nausea has improved somewhat from yesterday however.  No chest pain.  CURRENT MEDS . aspirin  81 mg Oral Daily  . ciprofloxacin  400 mg Intravenous Q12H  . enoxaparin (LOVENOX) injection  40 mg Subcutaneous Q24H  . metronidazole  500 mg Intravenous Q8H  . nicotine  14 mg Transdermal Daily  . ondansetron (ZOFRAN) IV  8 mg Intravenous 4 times per day  . pantoprazole (PROTONIX) IV  40 mg Intravenous Q12H  . regadenoson  0.4 mg Intravenous Once  . sodium chloride  3 mL Intravenous Q12H    OBJECTIVE  Filed Vitals:   06/07/13 0704 06/07/13 1300 06/07/13 2018 06/08/13 0457  BP: 114/60 116/64 124/61 125/64  Pulse: 56 55 50 61  Temp: 98.4 F (36.9 C) 98.6 F (37 C) 98.3 F (36.8 C) 97.9 F (36.6 C)  TempSrc: Oral     Resp: 19 18 18 18   Height:      Weight:      SpO2: 96% 100% 97% 100%    Intake/Output Summary (Last 24 hours) at 06/08/13 0917 Last data filed at 06/08/13 0650  Gross per 24 hour  Intake   4430 ml  Output    500 ml  Net   3930 ml   Filed Weights   06/05/13 1842  Weight: 199 lb (90.266 kg)    PHYSICAL EXAM  General: Pleasant, NAD. Neuro: Alert and oriented X 3. Moves all extremities spontaneously. Psych: Mildly depressed  HEENT:  Normal  Neck: Supple without bruits or JVD. Lungs:  Resp regular and unlabored, CTA. Heart: RRR no s3, s4, or murmurs. Abdomen: Soft, non-tender, non-distended, bowel sounds are distant. Extremities: No clubbing, cyanosis or edema. DP/PT/Radials 2+ and equal bilaterally.  Accessory Clinical Findings  CBC  Recent Labs  06/07/13 0519 06/07/13 1656  WBC 5.6 5.8  NEUTROABS 4.0  --   HGB 8.1* 8.2*  HCT 24.6*  24.6*  MCV 90.1 90.1  PLT 102* [id number]*   Basic Metabolic Panel  Recent Labs  06/06/13 0550 06/07/13 0519  NA 142 138  K 3.8 3.4*  CL 104 105  CO2 21 20  GLUCOSE 137* 112*  BUN 18 23  CREATININE 1.35 1.28  CALCIUM 8.7 8.1*   Liver Function Tests  Recent Labs  06/06/13 0550  AST 28  ALT 25  ALKPHOS 83  BILITOT 0.7  PROT 7.5  ALBUMIN 4.1   No results found for this basename: LIPASE, AMYLASE,  in the last 72 hours Cardiac Enzymes No results found for this basename: CKTOTAL, CKMB, CKMBINDEX, TROPONINI,  in the last 72 hours BNP No components found with this basename: POCBNP,  D-Dimer No results found for this basename: DDIMER,  in the last 72 hours Hemoglobin A1C  Recent Labs  06/05/13 2037  HGBA1C 5.6   Fasting Lipid Panel No results found for this basename: CHOL, HDL, LDLCALC, TRIG, CHOLHDL, LDLDIRECT,  in the last 72 hours Thyroid Function Tests  Recent Labs  06/05/13 2037  TSH 1.550    TELE  NSR  ECG   NSR. WNL.  Radiology/Studies  Ct Abdomen Pelvis Wo Contrast  06/05/2013   CLINICAL DATA:  Abdominal pain and  cramping.  Hematuria.  EXAM: CT ABDOMEN AND PELVIS WITHOUT CONTRAST  TECHNIQUE: Multidetector CT imaging of the abdomen and pelvis was performed following the standard protocol without IV contrast.  COMPARISON:  None.  FINDINGS: Minimal dependent atelectasis in the visualized lung bases. Coronary calcification. Unremarkable liver, spleen, right adrenal gland, pancreas. Unenhanced CT was performed per clinician order. Lack of IV contrast limits sensitivity and specificity, especially for evaluation of abdominal/pelvic solid viscera.  At least 2 subcentimeter partially calcified stones layer in the dependent aspect of the nondilated gallbladder. 15 mm low-attenuation left adrenal nodule, probably benign adenomas in the absence of history of primary carcinoma. . Small probable cysts in the upper pole left kidney and from the lower pole right kidney. No  hydronephrosis.  Fusiform distal aortic aneurysm measuring up to 5.8 cm maximum transverse diameter, tapering to a diameter of 3.6 cm at the bifurcation. Iliac arteries are atheromatous but non aneurysmal. There is no evidence of retroperitoneal hemorrhage nor hyperdense crescent to suggest impending rupture.  Stomach, small bowel, colon are nondilated. Multiple descending and sigmoid diverticula, with some mild inflammatory/edematous changes around the descending/sigmoid junction. No abscess. No free air. No ascites. Urinary bladder physiologically distended. Moderate prostatic enlargement with central coarse calcifications. No adenopathy.  IMPRESSION: 1. Diverticulitis at the descending/sigmoid junction without abscess. 2. 5.8 cm abdominal aortic aneurysm. Vascular surgery consultation recommended due to increased risk of rupture for AAA >5.5 cm. This recommendation follows ACR consensus. This recommendation follows ACR consensus guidelines: White Paper of the ACR Incidental Findings Committee II on Vascular Findings. J Am Coll Radiol 2013; 10:789-794.   Electronically Signed   By: Arne Cleveland M.D.   On: 06/05/2013 12:52   Dg Chest 2 View  06/05/2013   CLINICAL DATA:  Vomiting and facial swelling.  EXAM: CHEST  2 VIEW  COMPARISON:  Chest radiograph performed 10/20/2008  FINDINGS: The lungs are well-aerated and clear. There is no evidence of focal opacification, pleural effusion or pneumothorax.  The heart is normal in size; the mediastinal contour is within normal limits. No acute osseous abnormalities are seen.  IMPRESSION: No acute cardiopulmonary process seen.   Electronically Signed   By: Garald Balding M.D.   On: 06/05/2013 04:47   Ct Angio Chest Aortic Dissect W &/or W/o  06/07/2013   CLINICAL DATA:  Aortic aneurysm  EXAM: CT ANGIOGRAPHY CHEST, ABDOMEN AND PELVIS  TECHNIQUE: Multidetector CT imaging through the chest, abdomen and pelvis was performed using the standard protocol during bolus  administration of intravenous contrast. Multiplanar reconstructed images and MIPs were obtained and reviewed to evaluate the vascular anatomy.  CONTRAST:  153mL OMNIPAQUE IOHEXOL 350 MG/ML SOLN  COMPARISON:  06/05/2013  FINDINGS: CTA CHEST FINDINGS  No precontrast images were done. Patchy coronary, aortic arch, and descending thoracic aortic calcifications. Adequate contrast opacification of the thoracic aorta with no evidence of dissection, aneurysm, or stenosis. There is classic 3-vessel brachiocephalic arch anatomy without proximal stenosis. Good contrast opacification of pulmonary artery branches; the exam was not optimized for detection of pulmonary emboli. No pleural or pericardial effusion. Borderline enlarged AP window, prevascular, right hilar, and precarinal lymph nodes measuring up to the 11 mm short axis diameter. Subpleural blebs in both lung apices. Minimal dependent atelectasis posteriorly in both lower lobes with some patchy airspace consolidation or subsegmental atelectasis posterolaterally at the right lung base. Thoracic spine and sternum intact.  Review of the MIP images confirms the above findings.  CTA ABDOMEN AND PELVIS FINDINGS  Arterial findings:  Aorta:  Mild scattered calcified plaque in the suprarenal segment. Fusiform infrarenal aneurysm, 5.8 x 5.6 cm maximum transverse dimensions, tapering to a diameter of 3.4 cm at the bifurcation. Short infrarenal neck less than 3 cm. There is a large amount of intraluminal mural thrombus in the aneurysmal segment. No dissection or stenosis.  Celiac axis:         Patent.  Superior mesenteric: Patent, with replaced right hepatic arterial supply, an anatomic variant.  Left renal:          Single, patent.  Right renal:         Single, patent.  Inferior mesenteric: Short segment origin occlusion, reconstituted distally by visceral collaterals.  Left iliac: Eccentric calcified plaque through the common iliac artery. There is a short segment of at least 50%  diameter stenosis at the origin of the common iliac artery. There is a web-like stenosis at the origin of the external iliac artery resulting greater than 50% diameter narrowing, with some mild plaque in the more distal external iliac artery but no tandem stenotic lesion. Eccentric plaque in the internal iliac artery which is patent. No aneurysm.  Right iliac: Calcified plaque throughout the common iliac artery without high-grade stenosis or aneurysm. There is mild eccentric plaque in the proximal internal iliac artery. External iliac widely patent. No aneurysm or dissection.  Venous findings: Dedicated venous phase imaging not obtained. Patent portal vein, splenic vein, bilateral renal veins.  Review of the MIP images confirms the above findings.  Nonvascular findings: Unremarkable arterial phase evaluation of liver. Spleen mildly enlarged, 16.2 cm maximum length. 19 mm left adrenal nodule. Pancreas and right adrenal gland unremarkable. Several subcentimeter partially calcified stones layer in the dependent aspect of the gallbladder without wall thickening or enhancement. Small bilateral renal cysts. No hydronephrosis. Stomach, small bowel, and colon are nondilated. Innumerable distal descending and sigmoid diverticula. Persistent mild inflammatory change around the proximal sigmoid colon. No abscess. No free air. Urinary bladder is physiologically distended. Moderate prostatic enlargement with central coarse calcifications. No ascites. No adenopathy. Advanced degenerative disc disease L5-S1 with posterior protrusion.  IMPRESSION: 1. 5.8 cm fusiform infrarenal abdominal aortic aneurysm without evidence of rupture or other complicating features. Vascular surgery consultation recommended due to increased risk of rupture for AAA >5.5 cm. This recommendation follows ACR consensus. This recommendation follows ACR consensus guidelines: White Paper of the ACR Incidental Findings Committee II on Vascular Findings. J Am  Coll Radiol 2013; 10:789-794. 2. Tandem stenoses in the proximal and distal left common iliac artery. 3. Nonspecific right hilar and mediastinal adenopathy. 4. Patchy airspace infiltrate in the posterolateral right lower lobe. 5. Splenomegaly 6. Cholelithiasis 7. Stable left adrenal nodule, probably benign adenoma in the absence of a history of primary carcinoma. 8. Stable mild sigmoid diverticulitis without abscess.   Electronically Signed   By: Arne Cleveland M.D.   On: 06/07/2013 11:27   Ct Angio Abd/pel W/ And/or W/o  06/07/2013   CLINICAL DATA:  Aortic aneurysm  EXAM: CT ANGIOGRAPHY CHEST, ABDOMEN AND PELVIS  TECHNIQUE: Multidetector CT imaging through the chest, abdomen and pelvis was performed using the standard protocol during bolus administration of intravenous contrast. Multiplanar reconstructed images and MIPs were obtained and reviewed to evaluate the vascular anatomy.  CONTRAST:  125mL OMNIPAQUE IOHEXOL 350 MG/ML SOLN  COMPARISON:  06/05/2013  FINDINGS: CTA CHEST FINDINGS  No precontrast images were done. Patchy coronary, aortic arch, and descending thoracic aortic calcifications. Adequate contrast opacification of the thoracic aorta with no evidence of dissection, aneurysm,  or stenosis. There is classic 3-vessel brachiocephalic arch anatomy without proximal stenosis. Good contrast opacification of pulmonary artery branches; the exam was not optimized for detection of pulmonary emboli. No pleural or pericardial effusion. Borderline enlarged AP window, prevascular, right hilar, and precarinal lymph nodes measuring up to the 11 mm short axis diameter. Subpleural blebs in both lung apices. Minimal dependent atelectasis posteriorly in both lower lobes with some patchy airspace consolidation or subsegmental atelectasis posterolaterally at the right lung base. Thoracic spine and sternum intact.  Review of the MIP images confirms the above findings.  CTA ABDOMEN AND PELVIS FINDINGS  Arterial findings:   Aorta: Mild scattered calcified plaque in the suprarenal segment. Fusiform infrarenal aneurysm, 5.8 x 5.6 cm maximum transverse dimensions, tapering to a diameter of 3.4 cm at the bifurcation. Short infrarenal neck less than 3 cm. There is a large amount of intraluminal mural thrombus in the aneurysmal segment. No dissection or stenosis.  Celiac axis:         Patent.  Superior mesenteric: Patent, with replaced right hepatic arterial supply, an anatomic variant.  Left renal:          Single, patent.  Right renal:         Single, patent.  Inferior mesenteric: Short segment origin occlusion, reconstituted distally by visceral collaterals.  Left iliac: Eccentric calcified plaque through the common iliac artery. There is a short segment of at least 50% diameter stenosis at the origin of the common iliac artery. There is a web-like stenosis at the origin of the external iliac artery resulting greater than 50% diameter narrowing, with some mild plaque in the more distal external iliac artery but no tandem stenotic lesion. Eccentric plaque in the internal iliac artery which is patent. No aneurysm.  Right iliac: Calcified plaque throughout the common iliac artery without high-grade stenosis or aneurysm. There is mild eccentric plaque in the proximal internal iliac artery. External iliac widely patent. No aneurysm or dissection.  Venous findings: Dedicated venous phase imaging not obtained. Patent portal vein, splenic vein, bilateral renal veins.  Review of the MIP images confirms the above findings.  Nonvascular findings: Unremarkable arterial phase evaluation of liver. Spleen mildly enlarged, 16.2 cm maximum length. 19 mm left adrenal nodule. Pancreas and right adrenal gland unremarkable. Several subcentimeter partially calcified stones layer in the dependent aspect of the gallbladder without wall thickening or enhancement. Small bilateral renal cysts. No hydronephrosis. Stomach, small bowel, and colon are nondilated.  Innumerable distal descending and sigmoid diverticula. Persistent mild inflammatory change around the proximal sigmoid colon. No abscess. No free air. Urinary bladder is physiologically distended. Moderate prostatic enlargement with central coarse calcifications. No ascites. No adenopathy. Advanced degenerative disc disease L5-S1 with posterior protrusion.  IMPRESSION: 1. 5.8 cm fusiform infrarenal abdominal aortic aneurysm without evidence of rupture or other complicating features. Vascular surgery consultation recommended due to increased risk of rupture for AAA >5.5 cm. This recommendation follows ACR consensus. This recommendation follows ACR consensus guidelines: White Paper of the ACR Incidental Findings Committee II on Vascular Findings. J Am Coll Radiol 2013; 10:789-794. 2. Tandem stenoses in the proximal and distal left common iliac artery. 3. Nonspecific right hilar and mediastinal adenopathy. 4. Patchy airspace infiltrate in the posterolateral right lower lobe. 5. Splenomegaly 6. Cholelithiasis 7. Stable left adrenal nodule, probably benign adenoma in the absence of a history of primary carcinoma. 8. Stable mild sigmoid diverticulitis without abscess.   Electronically Signed   By: Arne Cleveland M.D.   On: 06/07/2013 11:27  ASSESSMENT AND PLAN 1. CAD with hx old inferior MI.  No recent chest pain.  2. AAA - new dx 5.8 cm  3. Acute diverticulitis with intractable nausea and vomiting  4. Tobacco abuse  5. Anemia  Patient is still too nauseated to do stress test today. Will reschedule to tomorrow.   Signed, Darlin Coco MD

## 2013-06-08 NOTE — Progress Notes (Signed)
EKG performed per order after patient complaint of chest pain. Troponin will be drawn x 3. At this time a transfer to stepdown has been requested. Will await bed and transfer patient as a bed becomes available.  Family informed and agreeable to moving patient to a higher level of care. No nausea/emesis at this time and is resting comfortably in the bed. Pain medication will be administered PRN as available. Will continue to monitor.

## 2013-06-08 NOTE — Progress Notes (Signed)
  I have seen and examined the patient, and reviewed the daily progress note by Arrie Aran, MS 3 and discussed the care of the patient with them. Please see my progress note from 06/08/2013 for further details regarding assessment and plan.    Signed:  Duwaine Maxin, DO 06/08/2013, 2:18 PM

## 2013-06-08 NOTE — Progress Notes (Signed)
Patient very nauseated additional medication given. Patient now complaining of chest pain MD in room with patient and family

## 2013-06-08 NOTE — Progress Notes (Signed)
Subjective: Pt n/v somewhat improved overnight; was able to sleep 5 consecutive hours.  This AM c/o continued n/v requesting breakthrough phenergan.  Endorses malaise, weakness, decreased BM and urine.  This afternoon, pt continued n/v and c/o new onset CP, which he described as a pressure-like sensation.  EKG obtained, sinus brady, no changes from previous.    Objective: Vital signs in last 24 hours: Temp:  [97.9 F (36.6 C)-98.6 F (37 C)] 97.9 F (36.6 C) (05/26 0457) Pulse Rate:  [50-61] 61 (05/26 0457) Resp:  [18] 18 (05/26 0457) BP: (116-125)/(61-64) 125/64 mmHg (05/26 0457) SpO2:  [97 %-100 %] 100 % (05/26 0457)  Intake/Output from previous day: 05/25 0701 - 05/26 0700 In: 4630 [P.O.:480; I.V.:2400] Out: 500 [Urine:500] Intake/Output this shift:    General appearance: alert, appears stated age, no distress and ill Head: Normocephalic, without obvious abnormality, atraumatic Throat: MMM Lungs: clear to auscultation bilaterally Abdomen: decreased BS, soft, NTND, TTP all quadrants. Extremities: extremities normal, atraumatic, no cyanosis or edema Pulses: diminished in LE L>R, 2+ Bil UE  Results for orders placed during the hospital encounter of 06/05/13 (from the past 24 hour(s))  CBC     Status: Abnormal   Collection Time    06/07/13  4:56 PM      Result Value Ref Range   WBC 5.8  4.0 - 10.5 K/uL   RBC 2.73 (*) 4.22 - 5.81 MIL/uL   Hemoglobin 8.2 (*) 13.0 - 17.0 g/dL   HCT 24.6 (*) 39.0 - 52.0 %   MCV 90.1  78.0 - 100.0 fL   MCH 30.0  26.0 - 34.0 pg   MCHC 33.3  30.0 - 36.0 g/dL   RDW 20.5 (*) 11.5 - 15.5 %   Platelets 114 (*) 150 - 400 K/uL  VITAMIN B12     Status: None   Collection Time    06/07/13  4:56 PM      Result Value Ref Range   Vitamin B-12 330  211 - 911 pg/mL  FOLATE     Status: None   Collection Time    06/07/13  4:56 PM      Result Value Ref Range   Folate 8.5    IRON AND TIBC     Status: None   Collection Time    06/07/13  4:56 PM       Result Value Ref Range   Iron 71  42 - 135 ug/dL   TIBC 316  215 - 435 ug/dL   Saturation Ratios 22  20 - 55 %   UIBC 245  125 - 400 ug/dL  FERRITIN     Status: None   Collection Time    06/07/13  4:56 PM      Result Value Ref Range   Ferritin 71  22 - 322 ng/mL  RETICULOCYTES     Status: Abnormal   Collection Time    06/07/13  4:56 PM      Result Value Ref Range   Retic Ct Pct 1.8  0.4 - 3.1 %   RBC. 2.73 (*) 4.22 - 5.81 MIL/uL   Retic Count, Manual 49.1  19.0 - 186.0 K/uL  CBC     Status: Abnormal   Collection Time    06/08/13  8:40 AM      Result Value Ref Range   WBC 6.1  4.0 - 10.5 K/uL   RBC 2.80 (*) 4.22 - 5.81 MIL/uL   Hemoglobin 8.4 (*) 13.0 - 17.0 g/dL   HCT  25.4 (*) 39.0 - 52.0 %   MCV 90.7  78.0 - 100.0 fL   MCH 30.0  26.0 - 34.0 pg   MCHC 33.1  30.0 - 36.0 g/dL   RDW 20.6 (*) 11.5 - 15.5 %   Platelets 115 (*) 150 - 400 K/uL  BASIC METABOLIC PANEL     Status: Abnormal   Collection Time    06/08/13  8:40 AM      Result Value Ref Range   Sodium 140  137 - 147 mEq/L   Potassium 3.5 (*) 3.7 - 5.3 mEq/L   Chloride 106  96 - 112 mEq/L   CO2 22  19 - 32 mEq/L   Glucose, Bld 117 (*) 70 - 99 mg/dL   BUN 20  6 - 23 mg/dL   Creatinine, Ser 1.26  0.50 - 1.35 mg/dL   Calcium 7.8 (*) 8.4 - 10.5 mg/dL   GFR calc non Af Amer 58 (*) >90 mL/min   GFR calc Af Amer 67 (*) >90 mL/min    Studies/Results: Ct Abdomen Pelvis Wo Contrast  06/05/2013   CLINICAL DATA:  Abdominal pain and cramping.  Hematuria.  EXAM: CT ABDOMEN AND PELVIS WITHOUT CONTRAST  TECHNIQUE: Multidetector CT imaging of the abdomen and pelvis was performed following the standard protocol without IV contrast.  COMPARISON:  None.  FINDINGS: Minimal dependent atelectasis in the visualized lung bases. Coronary calcification. Unremarkable liver, spleen, right adrenal gland, pancreas. Unenhanced CT was performed per clinician order. Lack of IV contrast limits sensitivity and specificity, especially for evaluation of  abdominal/pelvic solid viscera.  At least 2 subcentimeter partially calcified stones layer in the dependent aspect of the nondilated gallbladder. 15 mm low-attenuation left adrenal nodule, probably benign adenomas in the absence of history of primary carcinoma. . Small probable cysts in the upper pole left kidney and from the lower pole right kidney. No hydronephrosis.  Fusiform distal aortic aneurysm measuring up to 5.8 cm maximum transverse diameter, tapering to a diameter of 3.6 cm at the bifurcation. Iliac arteries are atheromatous but non aneurysmal. There is no evidence of retroperitoneal hemorrhage nor hyperdense crescent to suggest impending rupture.  Stomach, small bowel, colon are nondilated. Multiple descending and sigmoid diverticula, with some mild inflammatory/edematous changes around the descending/sigmoid junction. No abscess. No free air. No ascites. Urinary bladder physiologically distended. Moderate prostatic enlargement with central coarse calcifications. No adenopathy.  IMPRESSION: 1. Diverticulitis at the descending/sigmoid junction without abscess. 2. 5.8 cm abdominal aortic aneurysm. Vascular surgery consultation recommended due to increased risk of rupture for AAA >5.5 cm. This recommendation follows ACR consensus. This recommendation follows ACR consensus guidelines: White Paper of the ACR Incidental Findings Committee II on Vascular Findings. J Am Coll Radiol 2013; 10:789-794.   Electronically Signed   By: Arne Cleveland M.D.   On: 06/05/2013 12:52   Dg Chest 2 View  06/05/2013   CLINICAL DATA:  Vomiting and facial swelling.  EXAM: CHEST  2 VIEW  COMPARISON:  Chest radiograph performed 10/20/2008  FINDINGS: The lungs are well-aerated and clear. There is no evidence of focal opacification, pleural effusion or pneumothorax.  The heart is normal in size; the mediastinal contour is within normal limits. No acute osseous abnormalities are seen.  IMPRESSION: No acute cardiopulmonary process  seen.   Electronically Signed   By: Garald Balding M.D.   On: 06/05/2013 04:47   Ct Angio Chest Aortic Dissect W &/or W/o  06/07/2013   CLINICAL DATA:  Aortic aneurysm  EXAM: CT ANGIOGRAPHY  CHEST, ABDOMEN AND PELVIS  TECHNIQUE: Multidetector CT imaging through the chest, abdomen and pelvis was performed using the standard protocol during bolus administration of intravenous contrast. Multiplanar reconstructed images and MIPs were obtained and reviewed to evaluate the vascular anatomy.  CONTRAST:  170mL OMNIPAQUE IOHEXOL 350 MG/ML SOLN  COMPARISON:  06/05/2013  FINDINGS: CTA CHEST FINDINGS  No precontrast images were done. Patchy coronary, aortic arch, and descending thoracic aortic calcifications. Adequate contrast opacification of the thoracic aorta with no evidence of dissection, aneurysm, or stenosis. There is classic 3-vessel brachiocephalic arch anatomy without proximal stenosis. Good contrast opacification of pulmonary artery branches; the exam was not optimized for detection of pulmonary emboli. No pleural or pericardial effusion. Borderline enlarged AP window, prevascular, right hilar, and precarinal lymph nodes measuring up to the 11 mm short axis diameter. Subpleural blebs in both lung apices. Minimal dependent atelectasis posteriorly in both lower lobes with some patchy airspace consolidation or subsegmental atelectasis posterolaterally at the right lung base. Thoracic spine and sternum intact.  Review of the MIP images confirms the above findings.  CTA ABDOMEN AND PELVIS FINDINGS  Arterial findings:  Aorta: Mild scattered calcified plaque in the suprarenal segment. Fusiform infrarenal aneurysm, 5.8 x 5.6 cm maximum transverse dimensions, tapering to a diameter of 3.4 cm at the bifurcation. Short infrarenal neck less than 3 cm. There is a large amount of intraluminal mural thrombus in the aneurysmal segment. No dissection or stenosis.  Celiac axis:         Patent.  Superior mesenteric: Patent, with  replaced right hepatic arterial supply, an anatomic variant.  Left renal:          Single, patent.  Right renal:         Single, patent.  Inferior mesenteric: Short segment origin occlusion, reconstituted distally by visceral collaterals.  Left iliac: Eccentric calcified plaque through the common iliac artery. There is a short segment of at least 50% diameter stenosis at the origin of the common iliac artery. There is a web-like stenosis at the origin of the external iliac artery resulting greater than 50% diameter narrowing, with some mild plaque in the more distal external iliac artery but no tandem stenotic lesion. Eccentric plaque in the internal iliac artery which is patent. No aneurysm.  Right iliac: Calcified plaque throughout the common iliac artery without high-grade stenosis or aneurysm. There is mild eccentric plaque in the proximal internal iliac artery. External iliac widely patent. No aneurysm or dissection.  Venous findings: Dedicated venous phase imaging not obtained. Patent portal vein, splenic vein, bilateral renal veins.  Review of the MIP images confirms the above findings.  Nonvascular findings: Unremarkable arterial phase evaluation of liver. Spleen mildly enlarged, 16.2 cm maximum length. 19 mm left adrenal nodule. Pancreas and right adrenal gland unremarkable. Several subcentimeter partially calcified stones layer in the dependent aspect of the gallbladder without wall thickening or enhancement. Small bilateral renal cysts. No hydronephrosis. Stomach, small bowel, and colon are nondilated. Innumerable distal descending and sigmoid diverticula. Persistent mild inflammatory change around the proximal sigmoid colon. No abscess. No free air. Urinary bladder is physiologically distended. Moderate prostatic enlargement with central coarse calcifications. No ascites. No adenopathy. Advanced degenerative disc disease L5-S1 with posterior protrusion.  IMPRESSION: 1. 5.8 cm fusiform infrarenal  abdominal aortic aneurysm without evidence of rupture or other complicating features. Vascular surgery consultation recommended due to increased risk of rupture for AAA >5.5 cm. This recommendation follows ACR consensus. This recommendation follows ACR consensus guidelines: White Paper of the  ACR Incidental Findings Committee II on Vascular Findings. J Am Coll Radiol 2013; 10:789-794. 2. Tandem stenoses in the proximal and distal left common iliac artery. 3. Nonspecific right hilar and mediastinal adenopathy. 4. Patchy airspace infiltrate in the posterolateral right lower lobe. 5. Splenomegaly 6. Cholelithiasis 7. Stable left adrenal nodule, probably benign adenoma in the absence of a history of primary carcinoma. 8. Stable mild sigmoid diverticulitis without abscess.   Electronically Signed   By: Arne Cleveland M.D.   On: 06/07/2013 11:27   Ct Angio Abd/pel W/ And/or W/o  06/07/2013   CLINICAL DATA:  Aortic aneurysm  EXAM: CT ANGIOGRAPHY CHEST, ABDOMEN AND PELVIS  TECHNIQUE: Multidetector CT imaging through the chest, abdomen and pelvis was performed using the standard protocol during bolus administration of intravenous contrast. Multiplanar reconstructed images and MIPs were obtained and reviewed to evaluate the vascular anatomy.  CONTRAST:  121mL OMNIPAQUE IOHEXOL 350 MG/ML SOLN  COMPARISON:  06/05/2013  FINDINGS: CTA CHEST FINDINGS  No precontrast images were done. Patchy coronary, aortic arch, and descending thoracic aortic calcifications. Adequate contrast opacification of the thoracic aorta with no evidence of dissection, aneurysm, or stenosis. There is classic 3-vessel brachiocephalic arch anatomy without proximal stenosis. Good contrast opacification of pulmonary artery branches; the exam was not optimized for detection of pulmonary emboli. No pleural or pericardial effusion. Borderline enlarged AP window, prevascular, right hilar, and precarinal lymph nodes measuring up to the 11 mm short axis  diameter. Subpleural blebs in both lung apices. Minimal dependent atelectasis posteriorly in both lower lobes with some patchy airspace consolidation or subsegmental atelectasis posterolaterally at the right lung base. Thoracic spine and sternum intact.  Review of the MIP images confirms the above findings.  CTA ABDOMEN AND PELVIS FINDINGS  Arterial findings:  Aorta: Mild scattered calcified plaque in the suprarenal segment. Fusiform infrarenal aneurysm, 5.8 x 5.6 cm maximum transverse dimensions, tapering to a diameter of 3.4 cm at the bifurcation. Short infrarenal neck less than 3 cm. There is a large amount of intraluminal mural thrombus in the aneurysmal segment. No dissection or stenosis.  Celiac axis:         Patent.  Superior mesenteric: Patent, with replaced right hepatic arterial supply, an anatomic variant.  Left renal:          Single, patent.  Right renal:         Single, patent.  Inferior mesenteric: Short segment origin occlusion, reconstituted distally by visceral collaterals.  Left iliac: Eccentric calcified plaque through the common iliac artery. There is a short segment of at least 50% diameter stenosis at the origin of the common iliac artery. There is a web-like stenosis at the origin of the external iliac artery resulting greater than 50% diameter narrowing, with some mild plaque in the more distal external iliac artery but no tandem stenotic lesion. Eccentric plaque in the internal iliac artery which is patent. No aneurysm.  Right iliac: Calcified plaque throughout the common iliac artery without high-grade stenosis or aneurysm. There is mild eccentric plaque in the proximal internal iliac artery. External iliac widely patent. No aneurysm or dissection.  Venous findings: Dedicated venous phase imaging not obtained. Patent portal vein, splenic vein, bilateral renal veins.  Review of the MIP images confirms the above findings.  Nonvascular findings: Unremarkable arterial phase evaluation of  liver. Spleen mildly enlarged, 16.2 cm maximum length. 19 mm left adrenal nodule. Pancreas and right adrenal gland unremarkable. Several subcentimeter partially calcified stones layer in the dependent aspect of the gallbladder without  wall thickening or enhancement. Small bilateral renal cysts. No hydronephrosis. Stomach, small bowel, and colon are nondilated. Innumerable distal descending and sigmoid diverticula. Persistent mild inflammatory change around the proximal sigmoid colon. No abscess. No free air. Urinary bladder is physiologically distended. Moderate prostatic enlargement with central coarse calcifications. No ascites. No adenopathy. Advanced degenerative disc disease L5-S1 with posterior protrusion.  IMPRESSION: 1. 5.8 cm fusiform infrarenal abdominal aortic aneurysm without evidence of rupture or other complicating features. Vascular surgery consultation recommended due to increased risk of rupture for AAA >5.5 cm. This recommendation follows ACR consensus. This recommendation follows ACR consensus guidelines: White Paper of the ACR Incidental Findings Committee II on Vascular Findings. J Am Coll Radiol 2013; 10:789-794. 2. Tandem stenoses in the proximal and distal left common iliac artery. 3. Nonspecific right hilar and mediastinal adenopathy. 4. Patchy airspace infiltrate in the posterolateral right lower lobe. 5. Splenomegaly 6. Cholelithiasis 7. Stable left adrenal nodule, probably benign adenoma in the absence of a history of primary carcinoma. 8. Stable mild sigmoid diverticulitis without abscess.   Electronically Signed   By: Arne Cleveland M.D.   On: 06/07/2013 11:27    Scheduled Meds: . aspirin  81 mg Oral Daily  . ciprofloxacin  400 mg Intravenous Q12H  . enoxaparin (LOVENOX) injection  40 mg Subcutaneous Q24H  . metronidazole  500 mg Intravenous Q8H  . nicotine  14 mg Transdermal Daily  . ondansetron (ZOFRAN) IV  8 mg Intravenous 4 times per day  . pantoprazole (PROTONIX) IV  40  mg Intravenous Q12H  . potassium chloride  10 mEq Intravenous Q1 Hr x 4  . regadenoson  0.4 mg Intravenous Once  . sodium chloride  3 mL Intravenous Q12H   Continuous Infusions: . sodium chloride 150 mL/hr at 06/07/13 1941   PRN Meds:HYDROmorphone (DILAUDID) injection, LORazepam, promethazine  Assessment/Plan: Diverticulitis: abd pain, N/V x no fever x 4 days, LLQ TTP; CT w/ contrast reveals "mild" diverticulitis at the descending/sigmoid junction without abscess, splenomegaly. Dilaudid is controlling pain. His N/V has not responded to Reglan, Phenergan or Zofran (8mg ). It is very important to get his N/V controlled to avoid increased abd pressure given AAA of 5.8cm. Questionable whether diverticulitis is causing his symptoms, given that his clinical exam is disproportionate to CT findings, so we will D/C antibiotics for concerns this may be contributing to intractable n/v.  He has not had BM in several days and reports no flatus.  - CTA chest/abd/pelvis: confirmed infrarenal AAA, mild splenomegaly, mild sigmoid diverticulitis, stable left adrenal nodule, short segment inferior mesenteric origin occlusion, reconstituted distally by visceral collaterals. - consult to GI for assistance with managing intractable vomiting, appreciate req's: advised to start clear liquids, schedule Zofran 8mg  q6h, switch morphine>dilaudid, and check anemia panel - discontinue cipro and flagyl  - zofran 8mg  q6h for nausea  - phenergan  6.25mg  q6h PRN for break through nausea, will consider scheduling of ABx D/C does not improve Sxs - dilaudid for pain  - clear liquid diet - NSS at 150cc/hr, plus potassium supplementation for poor oral intake - monitor CBC   AAA: CT abdomen reveals 5.8cm AAA. Has ~ 30 pack year smoking history. No signs of leak or rupture but > 5.5cm requires surgical repair. Vascular surgery consulted and do not feel the patient's symptoms are related to AAA.  CT chest/abd/pelvis obtained; will do  repair once GI symptoms are controlled. Likely OR on 05/27. Appreciate cardiology and vascular surgery recommendations.  - CTA chest and abd/pelvis with contrast  did not show any acute abdominal process - carotid dopplers, and lower extremity doppler results show no obvious stenosis in carotids or left leg, 20-49% stenosis in R Femoral artery.  - urgent repair should he decompensate  - pre-op lexiscan myoview once N/V/abd pain resolve (plan for 05/27)  - risk stratification - encourage smoking cessation; 05/2013 LDL 38 and patient is on Crestor  - continue ASA, fish oil, Crestor once patient resumes po   Chest Pain: Likely due to MSK pain 2/2 intractable vomiting.  EKG showed no changes, no signs of respiratory distress, no blood in vomitus, making more serious causes like MI, Mallory-weiss, or esophageal rupture less likely.  First set of troponins are negative, will defer transfer to higher level of care unless cardiac markers start to trend up.  -draw 2 more troponins  Anemia: hgb 10.2 >> 8.1 this admission. No gross blood loss noted. May be a component of dilution given concurrent drop in other heme lines, but concerned there may be another pathological process contributing.  Will require further evaluation once more urgent issues resolved.  - monitor CBC twice daily  - transfuse < 8.0   Hypokalemia: K 3.4. Vomiting is most likely etiology.  - replete with IV KCl x 4 runs  - monitor BMP   AKI, improving: Baseline Cr 1.3. Creatinine at admission 1.41>>>1.26 with hydration.  - continue IV fluids - bolus prior to CT  - monitor BMP   Tobacco use disorder: ~ 30 pack year history. Likely contributed to AAA above. He has quit successfully in the past for 8 months.  - nicotine patch  - encourage smoking cessation   CAD/HLD/HTN: No chest pain or EKG changes. Trop negative. BP stable.  - hold ASA, fish oil, Lopressor, Crestor while NPO   Diet: Clear liquids  VTE ppx: [address]ovenox [address]   Code:  Full   Dispo: Disposition is deferred at this time, awaiting improvement of current medical problems. Anticipated discharge in approximately 2-3 day(s) after surgery.     LOS: 3 days   Barbie Haggis

## 2013-06-09 LAB — CBC
HEMATOCRIT: 27.8 % — AB (ref 39.0–52.0)
HEMATOCRIT: 25.5 % — AB (ref 39.0–52.0)
HEMOGLOBIN: 9 g/dL — AB (ref 13.0–17.0)
Hemoglobin: 8.6 g/dL — ABNORMAL LOW (ref 13.0–17.0)
MCH: 29.3 pg (ref 26.0–34.0)
MCH: 30.2 pg (ref 26.0–34.0)
MCHC: 32.4 g/dL (ref 30.0–36.0)
MCHC: 33.7 g/dL (ref 30.0–36.0)
MCV: 90.6 fL (ref 78.0–100.0)
MCV: 89.5 fL (ref 78.0–100.0)
Platelets: 118 10*3/uL — ABNORMAL LOW (ref 150–400)
Platelets: 136 10*3/uL — ABNORMAL LOW (ref 150–400)
RBC: 3.07 MIL/uL — AB (ref 4.22–5.81)
RBC: 2.85 MIL/uL — ABNORMAL LOW (ref 4.22–5.81)
RDW: 20.1 % — AB (ref 11.5–15.5)
RDW: 20.6 % — AB (ref 11.5–15.5)
WBC: 5.5 10*3/uL (ref 4.0–10.5)
WBC: 6 10*3/uL (ref 4.0–10.5)

## 2013-06-09 LAB — BASIC METABOLIC PANEL
BUN: 17 mg/dL (ref 6–23)
CHLORIDE: 103 meq/L (ref 96–112)
CO2: 22 meq/L (ref 19–32)
Calcium: 8.3 mg/dL — ABNORMAL LOW (ref 8.4–10.5)
Creatinine, Ser: 1.23 mg/dL (ref 0.50–1.35)
GFR calc Af Amer: 69 mL/min — ABNORMAL LOW (ref >90)
GFR calc non Af Amer: 59 mL/min — ABNORMAL LOW (ref >90)
Glucose, Bld: 92 mg/dL (ref 70–99)
Potassium: 3.9 mEq/L (ref 3.7–5.3)
Sodium: 137 mEq/L (ref 137–147)

## 2013-06-09 MED ORDER — VANCOMYCIN HCL IN DEXTROSE 1-5 GM/200ML-% IV SOLN
1000.0000 mg | Freq: Two times a day (BID) | INTRAVENOUS | Status: DC
  Administered 2013-06-09 – 2013-06-10 (×2): 1000 mg via INTRAVENOUS
  Filled 2013-06-09 (×3): qty 200

## 2013-06-09 MED ORDER — DEXTROSE 5 % IV SOLN
2.0000 g | Freq: Two times a day (BID) | INTRAVENOUS | Status: DC
  Administered 2013-06-09 – 2013-06-10 (×2): 2 g via INTRAVENOUS
  Filled 2013-06-09 (×3): qty 2

## 2013-06-09 NOTE — Progress Notes (Signed)
Patient Name: [name] Date of Encounter: 06/09/2013     Principal Problem:   Diverticulitis Active Problems:   AAA (abdominal aortic aneurysm) without rupture   Nausea with vomiting    SUBJECTIVE  Initially the patient intended to do the stress test today but then became nauseated and so the test has been postponed until tomorrow.  No chest pain. Rhythm NSR  CURRENT MEDS . aspirin  81 mg Oral Daily  . enoxaparin (LOVENOX) injection  40 mg Subcutaneous Q24H  . nicotine  14 mg Transdermal Daily  . ondansetron (ZOFRAN) IV  8 mg Intravenous 4 times per day  . pantoprazole (PROTONIX) IV  40 mg Intravenous Q12H  . regadenoson  0.4 mg Intravenous Once  . sodium chloride  3 mL Intravenous Q12H    OBJECTIVE  Filed Vitals:   06/08/13 0457 06/08/13 1449 06/08/13 2223 06/09/13 0545  BP: 125/64 128/59 145/68 130/61  Pulse: 61 66 70 58  Temp: 97.9 F (36.6 C) 99.3 F (37.4 C) 98.6 F (37 C) 98 F (36.7 C)  TempSrc:  Oral Oral Oral  Resp: 18 18 16 18   Height:      Weight:      SpO2: 100% 95% 97% 99%    Intake/Output Summary (Last 24 hours) at 06/09/13 1110 Last data filed at 06/08/13 2223  Gross per 24 hour  Intake 1977.5 ml  Output    300 ml  Net 1677.5 ml   Filed Weights   06/05/13 1842  Weight: 199 lb (90.266 kg)    PHYSICAL EXAM  General: Pleasant, NAD. Neuro: Alert and oriented X 3. Moves all extremities spontaneously. Psych: Flat affect. HEENT:  Normal  Neck: Supple without bruits or JVD. Lungs:  Resp regular and unlabored, CTA. Heart: RRR no s3, s4, or murmurs. Abdomen: Soft, non-tender, non-distended, BS + x 4.  Extremities: No clubbing, cyanosis or edema. DP/PT/Radials 2+ and equal bilaterally.  Accessory Clinical Findings  CBC  Recent Labs  06/07/13 0519  06/08/13 1720 06/09/13 0542  WBC 5.6  < > 6.2 5.5  NEUTROABS 4.0  --   --   --   HGB 8.1*  < > 8.1* 9.0*  HCT 24.6*  < > 24.2* 27.8*  MCV 90.1  < > 89.3 90.6  PLT 102*  < >  115* 118*  < > = values in this interval not displayed. Basic Metabolic Panel  Recent Labs  06/08/13 0840 06/09/13 0542  NA 140 137  K 3.5* 3.9  CL 106 103  CO2 22 22  GLUCOSE 117* 92  BUN 20 17  CREATININE 1.26 1.23  CALCIUM 7.8* 8.3*   Liver Function Tests No results found for this basename: AST, ALT, ALKPHOS, BILITOT, PROT, ALBUMIN,  in the last 72 hours No results found for this basename: LIPASE, AMYLASE,  in the last 72 hours Cardiac Enzymes  Recent Labs  06/08/13 1145  TROPONINI <0.30   BNP No components found with this basename: POCBNP,  D-Dimer No results found for this basename: DDIMER,  in the last 72 hours Hemoglobin A1C No results found for this basename: HGBA1C,  in the last 72 hours Fasting Lipid Panel No results found for this basename: CHOL, HDL, LDLCALC, TRIG, CHOLHDL, LDLDIRECT,  in the last 72 hours Thyroid Function Tests No results found for this basename: TSH, T4TOTAL, FREET3, T3FREE, THYROIDAB,  in the last 72 hours  TELE  NSR  ECG    Radiology/Studies  Ct Abdomen Pelvis Wo Contrast  06/05/2013  CLINICAL DATA:  Abdominal pain and cramping.  Hematuria.  EXAM: CT ABDOMEN AND PELVIS WITHOUT CONTRAST  TECHNIQUE: Multidetector CT imaging of the abdomen and pelvis was performed following the standard protocol without IV contrast.  COMPARISON:  None.  FINDINGS: Minimal dependent atelectasis in the visualized lung bases. Coronary calcification. Unremarkable liver, spleen, right adrenal gland, pancreas. Unenhanced CT was performed per clinician order. Lack of IV contrast limits sensitivity and specificity, especially for evaluation of abdominal/pelvic solid viscera.  At least 2 subcentimeter partially calcified stones layer in the dependent aspect of the nondilated gallbladder. 15 mm low-attenuation left adrenal nodule, probably benign adenomas in the absence of history of primary carcinoma. . Small probable cysts in the upper pole left kidney and from  the lower pole right kidney. No hydronephrosis.  Fusiform distal aortic aneurysm measuring up to 5.8 cm maximum transverse diameter, tapering to a diameter of 3.6 cm at the bifurcation. Iliac arteries are atheromatous but non aneurysmal. There is no evidence of retroperitoneal hemorrhage nor hyperdense crescent to suggest impending rupture.  Stomach, small bowel, colon are nondilated. Multiple descending and sigmoid diverticula, with some mild inflammatory/edematous changes around the descending/sigmoid junction. No abscess. No free air. No ascites. Urinary bladder physiologically distended. Moderate prostatic enlargement with central coarse calcifications. No adenopathy.  IMPRESSION: 1. Diverticulitis at the descending/sigmoid junction without abscess. 2. 5.8 cm abdominal aortic aneurysm. Vascular surgery consultation recommended due to increased risk of rupture for AAA >5.5 cm. This recommendation follows ACR consensus. This recommendation follows ACR consensus guidelines: White Paper of the ACR Incidental Findings Committee II on Vascular Findings. J Am Coll Radiol 2013; 10:789-794.   Electronically Signed   By: Arne Cleveland M.D.   On: 06/05/2013 12:52   Dg Chest 2 View  06/05/2013   CLINICAL DATA:  Vomiting and facial swelling.  EXAM: CHEST  2 VIEW  COMPARISON:  Chest radiograph performed 10/20/2008  FINDINGS: The lungs are well-aerated and clear. There is no evidence of focal opacification, pleural effusion or pneumothorax.  The heart is normal in size; the mediastinal contour is within normal limits. No acute osseous abnormalities are seen.  IMPRESSION: No acute cardiopulmonary process seen.   Electronically Signed   By: Garald Balding M.D.   On: 06/05/2013 04:47   Ct Angio Chest Aortic Dissect W &/or W/o  06/07/2013   CLINICAL DATA:  Aortic aneurysm  EXAM: CT ANGIOGRAPHY CHEST, ABDOMEN AND PELVIS  TECHNIQUE: Multidetector CT imaging through the chest, abdomen and pelvis was performed using the  standard protocol during bolus administration of intravenous contrast. Multiplanar reconstructed images and MIPs were obtained and reviewed to evaluate the vascular anatomy.  CONTRAST:  176mL OMNIPAQUE IOHEXOL 350 MG/ML SOLN  COMPARISON:  06/05/2013  FINDINGS: CTA CHEST FINDINGS  No precontrast images were done. Patchy coronary, aortic arch, and descending thoracic aortic calcifications. Adequate contrast opacification of the thoracic aorta with no evidence of dissection, aneurysm, or stenosis. There is classic 3-vessel brachiocephalic arch anatomy without proximal stenosis. Good contrast opacification of pulmonary artery branches; the exam was not optimized for detection of pulmonary emboli. No pleural or pericardial effusion. Borderline enlarged AP window, prevascular, right hilar, and precarinal lymph nodes measuring up to the 11 mm short axis diameter. Subpleural blebs in both lung apices. Minimal dependent atelectasis posteriorly in both lower lobes with some patchy airspace consolidation or subsegmental atelectasis posterolaterally at the right lung base. Thoracic spine and sternum intact.  Review of the MIP images confirms the above findings.  CTA ABDOMEN AND PELVIS  FINDINGS  Arterial findings:  Aorta: Mild scattered calcified plaque in the suprarenal segment. Fusiform infrarenal aneurysm, 5.8 x 5.6 cm maximum transverse dimensions, tapering to a diameter of 3.4 cm at the bifurcation. Short infrarenal neck less than 3 cm. There is a large amount of intraluminal mural thrombus in the aneurysmal segment. No dissection or stenosis.  Celiac axis:         Patent.  Superior mesenteric: Patent, with replaced right hepatic arterial supply, an anatomic variant.  Left renal:          Single, patent.  Right renal:         Single, patent.  Inferior mesenteric: Short segment origin occlusion, reconstituted distally by visceral collaterals.  Left iliac: Eccentric calcified plaque through the common iliac artery. There is a  short segment of at least 50% diameter stenosis at the origin of the common iliac artery. There is a web-like stenosis at the origin of the external iliac artery resulting greater than 50% diameter narrowing, with some mild plaque in the more distal external iliac artery but no tandem stenotic lesion. Eccentric plaque in the internal iliac artery which is patent. No aneurysm.  Right iliac: Calcified plaque throughout the common iliac artery without high-grade stenosis or aneurysm. There is mild eccentric plaque in the proximal internal iliac artery. External iliac widely patent. No aneurysm or dissection.  Venous findings: Dedicated venous phase imaging not obtained. Patent portal vein, splenic vein, bilateral renal veins.  Review of the MIP images confirms the above findings.  Nonvascular findings: Unremarkable arterial phase evaluation of liver. Spleen mildly enlarged, 16.2 cm maximum length. 19 mm left adrenal nodule. Pancreas and right adrenal gland unremarkable. Several subcentimeter partially calcified stones layer in the dependent aspect of the gallbladder without wall thickening or enhancement. Small bilateral renal cysts. No hydronephrosis. Stomach, small bowel, and colon are nondilated. Innumerable distal descending and sigmoid diverticula. Persistent mild inflammatory change around the proximal sigmoid colon. No abscess. No free air. Urinary bladder is physiologically distended. Moderate prostatic enlargement with central coarse calcifications. No ascites. No adenopathy. Advanced degenerative disc disease L5-S1 with posterior protrusion.  IMPRESSION: 1. 5.8 cm fusiform infrarenal abdominal aortic aneurysm without evidence of rupture or other complicating features. Vascular surgery consultation recommended due to increased risk of rupture for AAA >5.5 cm. This recommendation follows ACR consensus. This recommendation follows ACR consensus guidelines: White Paper of the ACR Incidental Findings Committee II  on Vascular Findings. J Am Coll Radiol 2013; 10:789-794. 2. Tandem stenoses in the proximal and distal left common iliac artery. 3. Nonspecific right hilar and mediastinal adenopathy. 4. Patchy airspace infiltrate in the posterolateral right lower lobe. 5. Splenomegaly 6. Cholelithiasis 7. Stable left adrenal nodule, probably benign adenoma in the absence of a history of primary carcinoma. 8. Stable mild sigmoid diverticulitis without abscess.   Electronically Signed   By: Arne Cleveland M.D.   On: 06/07/2013 11:27   Ct Angio Abd/pel W/ And/or W/o  06/07/2013   CLINICAL DATA:  Aortic aneurysm  EXAM: CT ANGIOGRAPHY CHEST, ABDOMEN AND PELVIS  TECHNIQUE: Multidetector CT imaging through the chest, abdomen and pelvis was performed using the standard protocol during bolus administration of intravenous contrast. Multiplanar reconstructed images and MIPs were obtained and reviewed to evaluate the vascular anatomy.  CONTRAST:  184mL OMNIPAQUE IOHEXOL 350 MG/ML SOLN  COMPARISON:  06/05/2013  FINDINGS: CTA CHEST FINDINGS  No precontrast images were done. Patchy coronary, aortic arch, and descending thoracic aortic calcifications. Adequate contrast opacification of the thoracic aorta  with no evidence of dissection, aneurysm, or stenosis. There is classic 3-vessel brachiocephalic arch anatomy without proximal stenosis. Good contrast opacification of pulmonary artery branches; the exam was not optimized for detection of pulmonary emboli. No pleural or pericardial effusion. Borderline enlarged AP window, prevascular, right hilar, and precarinal lymph nodes measuring up to the 11 mm short axis diameter. Subpleural blebs in both lung apices. Minimal dependent atelectasis posteriorly in both lower lobes with some patchy airspace consolidation or subsegmental atelectasis posterolaterally at the right lung base. Thoracic spine and sternum intact.  Review of the MIP images confirms the above findings.  CTA ABDOMEN AND PELVIS  FINDINGS  Arterial findings:  Aorta: Mild scattered calcified plaque in the suprarenal segment. Fusiform infrarenal aneurysm, 5.8 x 5.6 cm maximum transverse dimensions, tapering to a diameter of 3.4 cm at the bifurcation. Short infrarenal neck less than 3 cm. There is a large amount of intraluminal mural thrombus in the aneurysmal segment. No dissection or stenosis.  Celiac axis:         Patent.  Superior mesenteric: Patent, with replaced right hepatic arterial supply, an anatomic variant.  Left renal:          Single, patent.  Right renal:         Single, patent.  Inferior mesenteric: Short segment origin occlusion, reconstituted distally by visceral collaterals.  Left iliac: Eccentric calcified plaque through the common iliac artery. There is a short segment of at least 50% diameter stenosis at the origin of the common iliac artery. There is a web-like stenosis at the origin of the external iliac artery resulting greater than 50% diameter narrowing, with some mild plaque in the more distal external iliac artery but no tandem stenotic lesion. Eccentric plaque in the internal iliac artery which is patent. No aneurysm.  Right iliac: Calcified plaque throughout the common iliac artery without high-grade stenosis or aneurysm. There is mild eccentric plaque in the proximal internal iliac artery. External iliac widely patent. No aneurysm or dissection.  Venous findings: Dedicated venous phase imaging not obtained. Patent portal vein, splenic vein, bilateral renal veins.  Review of the MIP images confirms the above findings.  Nonvascular findings: Unremarkable arterial phase evaluation of liver. Spleen mildly enlarged, 16.2 cm maximum length. 19 mm left adrenal nodule. Pancreas and right adrenal gland unremarkable. Several subcentimeter partially calcified stones layer in the dependent aspect of the gallbladder without wall thickening or enhancement. Small bilateral renal cysts. No hydronephrosis. Stomach, small bowel,  and colon are nondilated. Innumerable distal descending and sigmoid diverticula. Persistent mild inflammatory change around the proximal sigmoid colon. No abscess. No free air. Urinary bladder is physiologically distended. Moderate prostatic enlargement with central coarse calcifications. No ascites. No adenopathy. Advanced degenerative disc disease L5-S1 with posterior protrusion.  IMPRESSION: 1. 5.8 cm fusiform infrarenal abdominal aortic aneurysm without evidence of rupture or other complicating features. Vascular surgery consultation recommended due to increased risk of rupture for AAA >5.5 cm. This recommendation follows ACR consensus. This recommendation follows ACR consensus guidelines: White Paper of the ACR Incidental Findings Committee II on Vascular Findings. J Am Coll Radiol 2013; 10:789-794. 2. Tandem stenoses in the proximal and distal left common iliac artery. 3. Nonspecific right hilar and mediastinal adenopathy. 4. Patchy airspace infiltrate in the posterolateral right lower lobe. 5. Splenomegaly 6. Cholelithiasis 7. Stable left adrenal nodule, probably benign adenoma in the absence of a history of primary carcinoma. 8. Stable mild sigmoid diverticulitis without abscess.   Electronically Signed   By: Arne Cleveland  M.D.   On: 06/07/2013 11:27    ASSESSMENT AND PLAN 1. CAD with hx old inferior MI. No recent chest pain.  2. AAA - new dx 5.8 cm  3. Acute diverticulitis with intractable nausea and vomiting  4. Tobacco abuse  5. Anemia   Patient is still too nauseated to do stress test today. Will reschedule to tomorrow.    Signed, Darlin Coco MD

## 2013-06-09 NOTE — Progress Notes (Signed)
S: called to bedside by nurse for red swollen painful arm. The patient complained of progressive redness on right arm. Very tender to palpation and pain with movement.  O: VSS, afebrile. Full ROM. Brightly erythremic rash on left AC. Warm to touch. Painful to touch especially around the site of the previous IV line. No palpable crepitus.  A+P: Concern for cellulitis due to IV line and possibly complicated by phlebitis. Catheter was removed earlier today. Plan for blood culture x 2 followed by IV vanc and cefepime. Will follow cultures and deescalate to PO abx as necessary.

## 2013-06-09 NOTE — Progress Notes (Signed)
Subjective: Mr. Laplume had a better night last night in terms of n/v and sleep.  He was initially agreeable to attempting the stress test today, but decided against it as he was anxious about uncontrolled nausea during the test.  Will plan for tomorrow.  Objective: Vital signs in last 24 hours: Temp:  [98 F (36.7 C)-99.3 F (37.4 C)] 98 F (36.7 C) (05/27 0545) Pulse Rate:  [58-70] 58 (05/27 0545) Resp:  [16-18] 18 (05/27 0545) BP: (128-145)/(59-68) 130/61 mmHg (05/27 0545) SpO2:  [95 %-99 %] 99 % (05/27 0545)  Intake/Output from previous day: 05/26 0701 - 05/27 0700 In: 2077.5 [I.V.:1627.5] Out: 300 [Urine:300] Intake/Output this shift:   General appearance: alert, appears stated age, no distress and appeared more comfortable today Head: Normocephalic, without obvious abnormality, atraumatic  Throat: MMM  Lungs: clear to auscultation bilaterally  Abdomen:  BS present, soft, NTND Extremities: extremities normal, atraumatic, no cyanosis or edema  Pulses: diminished in LE L>R, 2+ Bil UE Neuro: Flat affect   Results for orders placed during the hospital encounter of 06/05/13 (from the past 24 hour(s))  CBC     Status: Abnormal   Collection Time    06/08/13  5:20 PM      Result Value Ref Range   WBC 6.2  4.0 - 10.5 K/uL   RBC 2.71 (*) 4.22 - 5.81 MIL/uL   Hemoglobin 8.1 (*) 13.0 - 17.0 g/dL   HCT 24.2 (*) 39.0 - 52.0 %   MCV 89.3  78.0 - 100.0 fL   MCH 29.9  26.0 - 34.0 pg   MCHC 33.5  30.0 - 36.0 g/dL   RDW 20.0 (*) 11.5 - 15.5 %   Platelets 115 (*) 150 - 400 K/uL  CBC     Status: Abnormal   Collection Time    06/09/13  5:42 AM      Result Value Ref Range   WBC 5.5  4.0 - 10.5 K/uL   RBC 3.07 (*) 4.22 - 5.81 MIL/uL   Hemoglobin 9.0 (*) 13.0 - 17.0 g/dL   HCT 27.8 (*) 39.0 - 52.0 %   MCV 90.6  78.0 - 100.0 fL   MCH 29.3  26.0 - 34.0 pg   MCHC 32.4  30.0 - 36.0 g/dL   RDW 20.1 (*) 11.5 - 15.5 %   Platelets 118 (*) 150 - 400 K/uL  BASIC METABOLIC PANEL     Status:  Abnormal   Collection Time    06/09/13  5:42 AM      Result Value Ref Range   Sodium 137  137 - 147 mEq/L   Potassium 3.9  3.7 - 5.3 mEq/L   Chloride 103  96 - 112 mEq/L   CO2 22  19 - 32 mEq/L   Glucose, Bld 92  70 - 99 mg/dL   BUN 17  6 - 23 mg/dL   Creatinine, Ser 1.23  0.50 - 1.35 mg/dL   Calcium 8.3 (*) 8.4 - 10.5 mg/dL   GFR calc non Af Amer 59 (*) >90 mL/min   GFR calc Af Amer 69 (*) >90 mL/min    Studies/Results: Ct Abdomen Pelvis Wo Contrast  06/05/2013   CLINICAL DATA:  Abdominal pain and cramping.  Hematuria.  EXAM: CT ABDOMEN AND PELVIS WITHOUT CONTRAST  TECHNIQUE: Multidetector CT imaging of the abdomen and pelvis was performed following the standard protocol without IV contrast.  COMPARISON:  None.  FINDINGS: Minimal dependent atelectasis in the visualized lung bases. Coronary calcification. Unremarkable liver,  spleen, right adrenal gland, pancreas. Unenhanced CT was performed per clinician order. Lack of IV contrast limits sensitivity and specificity, especially for evaluation of abdominal/pelvic solid viscera.  At least 2 subcentimeter partially calcified stones layer in the dependent aspect of the nondilated gallbladder. 15 mm low-attenuation left adrenal nodule, probably benign adenomas in the absence of history of primary carcinoma. . Small probable cysts in the upper pole left kidney and from the lower pole right kidney. No hydronephrosis.  Fusiform distal aortic aneurysm measuring up to 5.8 cm maximum transverse diameter, tapering to a diameter of 3.6 cm at the bifurcation. Iliac arteries are atheromatous but non aneurysmal. There is no evidence of retroperitoneal hemorrhage nor hyperdense crescent to suggest impending rupture.  Stomach, small bowel, colon are nondilated. Multiple descending and sigmoid diverticula, with some mild inflammatory/edematous changes around the descending/sigmoid junction. No abscess. No free air. No ascites. Urinary bladder physiologically  distended. Moderate prostatic enlargement with central coarse calcifications. No adenopathy.  IMPRESSION: 1. Diverticulitis at the descending/sigmoid junction without abscess. 2. 5.8 cm abdominal aortic aneurysm. Vascular surgery consultation recommended due to increased risk of rupture for AAA >5.5 cm. This recommendation follows ACR consensus. This recommendation follows ACR consensus guidelines: White Paper of the ACR Incidental Findings Committee II on Vascular Findings. J Am Coll Radiol 2013; 10:789-794.   Electronically Signed   By: Arne Cleveland M.D.   On: 06/05/2013 12:52   Dg Chest 2 View  06/05/2013   CLINICAL DATA:  Vomiting and facial swelling.  EXAM: CHEST  2 VIEW  COMPARISON:  Chest radiograph performed 10/20/2008  FINDINGS: The lungs are well-aerated and clear. There is no evidence of focal opacification, pleural effusion or pneumothorax.  The heart is normal in size; the mediastinal contour is within normal limits. No acute osseous abnormalities are seen.  IMPRESSION: No acute cardiopulmonary process seen.   Electronically Signed   By: Garald Balding M.D.   On: 06/05/2013 04:47   Ct Angio Chest Aortic Dissect W &/or W/o  06/07/2013   CLINICAL DATA:  Aortic aneurysm  EXAM: CT ANGIOGRAPHY CHEST, ABDOMEN AND PELVIS  TECHNIQUE: Multidetector CT imaging through the chest, abdomen and pelvis was performed using the standard protocol during bolus administration of intravenous contrast. Multiplanar reconstructed images and MIPs were obtained and reviewed to evaluate the vascular anatomy.  CONTRAST:  114mL OMNIPAQUE IOHEXOL 350 MG/ML SOLN  COMPARISON:  06/05/2013  FINDINGS: CTA CHEST FINDINGS  No precontrast images were done. Patchy coronary, aortic arch, and descending thoracic aortic calcifications. Adequate contrast opacification of the thoracic aorta with no evidence of dissection, aneurysm, or stenosis. There is classic 3-vessel brachiocephalic arch anatomy without proximal stenosis. Good  contrast opacification of pulmonary artery branches; the exam was not optimized for detection of pulmonary emboli. No pleural or pericardial effusion. Borderline enlarged AP window, prevascular, right hilar, and precarinal lymph nodes measuring up to the 11 mm short axis diameter. Subpleural blebs in both lung apices. Minimal dependent atelectasis posteriorly in both lower lobes with some patchy airspace consolidation or subsegmental atelectasis posterolaterally at the right lung base. Thoracic spine and sternum intact.  Review of the MIP images confirms the above findings.  CTA ABDOMEN AND PELVIS FINDINGS  Arterial findings:  Aorta: Mild scattered calcified plaque in the suprarenal segment. Fusiform infrarenal aneurysm, 5.8 x 5.6 cm maximum transverse dimensions, tapering to a diameter of 3.4 cm at the bifurcation. Short infrarenal neck less than 3 cm. There is a large amount of intraluminal mural thrombus in the aneurysmal segment.  No dissection or stenosis.  Celiac axis:         Patent.  Superior mesenteric: Patent, with replaced right hepatic arterial supply, an anatomic variant.  Left renal:          Single, patent.  Right renal:         Single, patent.  Inferior mesenteric: Short segment origin occlusion, reconstituted distally by visceral collaterals.  Left iliac: Eccentric calcified plaque through the common iliac artery. There is a short segment of at least 50% diameter stenosis at the origin of the common iliac artery. There is a web-like stenosis at the origin of the external iliac artery resulting greater than 50% diameter narrowing, with some mild plaque in the more distal external iliac artery but no tandem stenotic lesion. Eccentric plaque in the internal iliac artery which is patent. No aneurysm.  Right iliac: Calcified plaque throughout the common iliac artery without high-grade stenosis or aneurysm. There is mild eccentric plaque in the proximal internal iliac artery. External iliac widely patent.  No aneurysm or dissection.  Venous findings: Dedicated venous phase imaging not obtained. Patent portal vein, splenic vein, bilateral renal veins.  Review of the MIP images confirms the above findings.  Nonvascular findings: Unremarkable arterial phase evaluation of liver. Spleen mildly enlarged, 16.2 cm maximum length. 19 mm left adrenal nodule. Pancreas and right adrenal gland unremarkable. Several subcentimeter partially calcified stones layer in the dependent aspect of the gallbladder without wall thickening or enhancement. Small bilateral renal cysts. No hydronephrosis. Stomach, small bowel, and colon are nondilated. Innumerable distal descending and sigmoid diverticula. Persistent mild inflammatory change around the proximal sigmoid colon. No abscess. No free air. Urinary bladder is physiologically distended. Moderate prostatic enlargement with central coarse calcifications. No ascites. No adenopathy. Advanced degenerative disc disease L5-S1 with posterior protrusion.  IMPRESSION: 1. 5.8 cm fusiform infrarenal abdominal aortic aneurysm without evidence of rupture or other complicating features. Vascular surgery consultation recommended due to increased risk of rupture for AAA >5.5 cm. This recommendation follows ACR consensus. This recommendation follows ACR consensus guidelines: White Paper of the ACR Incidental Findings Committee II on Vascular Findings. J Am Coll Radiol 2013; 10:789-794. 2. Tandem stenoses in the proximal and distal left common iliac artery. 3. Nonspecific right hilar and mediastinal adenopathy. 4. Patchy airspace infiltrate in the posterolateral right lower lobe. 5. Splenomegaly 6. Cholelithiasis 7. Stable left adrenal nodule, probably benign adenoma in the absence of a history of primary carcinoma. 8. Stable mild sigmoid diverticulitis without abscess.   Electronically Signed   By: Arne Cleveland M.D.   On: 06/07/2013 11:27   Ct Angio Abd/pel W/ And/or W/o  06/07/2013   CLINICAL DATA:   Aortic aneurysm  EXAM: CT ANGIOGRAPHY CHEST, ABDOMEN AND PELVIS  TECHNIQUE: Multidetector CT imaging through the chest, abdomen and pelvis was performed using the standard protocol during bolus administration of intravenous contrast. Multiplanar reconstructed images and MIPs were obtained and reviewed to evaluate the vascular anatomy.  CONTRAST:  154mL OMNIPAQUE IOHEXOL 350 MG/ML SOLN  COMPARISON:  06/05/2013  FINDINGS: CTA CHEST FINDINGS  No precontrast images were done. Patchy coronary, aortic arch, and descending thoracic aortic calcifications. Adequate contrast opacification of the thoracic aorta with no evidence of dissection, aneurysm, or stenosis. There is classic 3-vessel brachiocephalic arch anatomy without proximal stenosis. Good contrast opacification of pulmonary artery branches; the exam was not optimized for detection of pulmonary emboli. No pleural or pericardial effusion. Borderline enlarged AP window, prevascular, right hilar, and precarinal lymph nodes measuring up to  the 11 mm short axis diameter. Subpleural blebs in both lung apices. Minimal dependent atelectasis posteriorly in both lower lobes with some patchy airspace consolidation or subsegmental atelectasis posterolaterally at the right lung base. Thoracic spine and sternum intact.  Review of the MIP images confirms the above findings.  CTA ABDOMEN AND PELVIS FINDINGS  Arterial findings:  Aorta: Mild scattered calcified plaque in the suprarenal segment. Fusiform infrarenal aneurysm, 5.8 x 5.6 cm maximum transverse dimensions, tapering to a diameter of 3.4 cm at the bifurcation. Short infrarenal neck less than 3 cm. There is a large amount of intraluminal mural thrombus in the aneurysmal segment. No dissection or stenosis.  Celiac axis:         Patent.  Superior mesenteric: Patent, with replaced right hepatic arterial supply, an anatomic variant.  Left renal:          Single, patent.  Right renal:         Single, patent.  Inferior mesenteric:  Short segment origin occlusion, reconstituted distally by visceral collaterals.  Left iliac: Eccentric calcified plaque through the common iliac artery. There is a short segment of at least 50% diameter stenosis at the origin of the common iliac artery. There is a web-like stenosis at the origin of the external iliac artery resulting greater than 50% diameter narrowing, with some mild plaque in the more distal external iliac artery but no tandem stenotic lesion. Eccentric plaque in the internal iliac artery which is patent. No aneurysm.  Right iliac: Calcified plaque throughout the common iliac artery without high-grade stenosis or aneurysm. There is mild eccentric plaque in the proximal internal iliac artery. External iliac widely patent. No aneurysm or dissection.  Venous findings: Dedicated venous phase imaging not obtained. Patent portal vein, splenic vein, bilateral renal veins.  Review of the MIP images confirms the above findings.  Nonvascular findings: Unremarkable arterial phase evaluation of liver. Spleen mildly enlarged, 16.2 cm maximum length. 19 mm left adrenal nodule. Pancreas and right adrenal gland unremarkable. Several subcentimeter partially calcified stones layer in the dependent aspect of the gallbladder without wall thickening or enhancement. Small bilateral renal cysts. No hydronephrosis. Stomach, small bowel, and colon are nondilated. Innumerable distal descending and sigmoid diverticula. Persistent mild inflammatory change around the proximal sigmoid colon. No abscess. No free air. Urinary bladder is physiologically distended. Moderate prostatic enlargement with central coarse calcifications. No ascites. No adenopathy. Advanced degenerative disc disease L5-S1 with posterior protrusion.  IMPRESSION: 1. 5.8 cm fusiform infrarenal abdominal aortic aneurysm without evidence of rupture or other complicating features. Vascular surgery consultation recommended due to increased risk of rupture for  AAA >5.5 cm. This recommendation follows ACR consensus. This recommendation follows ACR consensus guidelines: White Paper of the ACR Incidental Findings Committee II on Vascular Findings. J Am Coll Radiol 2013; 10:789-794. 2. Tandem stenoses in the proximal and distal left common iliac artery. 3. Nonspecific right hilar and mediastinal adenopathy. 4. Patchy airspace infiltrate in the posterolateral right lower lobe. 5. Splenomegaly 6. Cholelithiasis 7. Stable left adrenal nodule, probably benign adenoma in the absence of a history of primary carcinoma. 8. Stable mild sigmoid diverticulitis without abscess.   Electronically Signed   By: Arne Cleveland M.D.   On: 06/07/2013 11:27    Scheduled Meds: . aspirin  81 mg Oral Daily  . enoxaparin (LOVENOX) injection  40 mg Subcutaneous Q24H  . nicotine  14 mg Transdermal Daily  . ondansetron (ZOFRAN) IV  8 mg Intravenous 4 times per day  . pantoprazole (PROTONIX) IV  40 mg Intravenous Q12H  . regadenoson  0.4 mg Intravenous Once  . sodium chloride  3 mL Intravenous Q12H   Continuous Infusions: . sodium chloride 150 mL/hr at 06/09/13 0615   PRN Meds:HYDROmorphone (DILAUDID) injection, LORazepam, promethazine  Assessment/Plan: Diverticulitis: abd pain, N/V x no fever x 4 days, LLQ TTP; CT w/ contrast reveals "mild" diverticulitis at the descending/sigmoid junction without abscess, splenomegaly. Dilaudid is controlling pain. His N/V has not responded to Reglan, Phenergan or Zofran (8mg ). It is very important to get his N/V controlled to avoid increased abd pressure given AAA of 5.8cm. Questionable whether diverticulitis is causing his symptoms, given that his clinical exam is disproportionate to CT findings, so we D/C'd antibiotics for concerns this was contributing to intractable n/v. He has not had BM in several days and reports no flatus.  - CTA chest/abd/pelvis: confirmed infrarenal AAA, mild splenomegaly, mild sigmoid diverticulitis, stable left adrenal  nodule, short segment inferior mesenteric origin occlusion, reconstituted distally by visceral collaterals.  - consult to GI for assistance with managing intractable vomiting, appreciate req's: advised to start clear liquids, schedule Zofran 8mg  q6h, switch morphine>dilaudid, and check anemia panel  - cipro and flagyl discontinued yesterday 5/26 - zofran 8mg  q6h for nausea  - phenergan 6.25mg  q6h PRN for break through nausea, will consider scheduling if ABx D/C does not improve Sxs  - dilaudid for pain  - clear liquid diet  - NSS at 150cc/hr, plus potassium supplementation for poor oral intake.  K+ better at 3.9 today - monitor CBC   AAA: CT abdomen reveals 5.8cm AAA. Has ~ 30 pack year smoking history. No signs of leak or rupture but > 5.5cm requires surgical repair. Vascular surgery consulted and do not feel the patient's symptoms are related to AAA. CT chest/abd/pelvis obtained; will do repair once GI symptoms are controlled. Likely OR on 05/27. Appreciate cardiology and vascular surgery recommendations.  - CTA chest and abd/pelvis with contrast did not show any acute abdominal process  - carotid dopplers, and lower extremity doppler results show no obvious stenosis in carotids or left leg, 20-49% stenosis in R Femoral artery.  - urgent repair should he decompensate  - pre-op lexiscan myoview once N/V/abd pain resolve (plan for 05/28)  - risk stratification - encourage smoking cessation; 05/2013 LDL 38 and patient is on Crestor  - continue ASA, fish oil, Crestor once patient resumes po   Chest Pain: Likely due to MSK pain 2/2 intractable vomiting. EKG showed no changes, no signs of respiratory distress, no blood in vomitus, making more serious causes like MI, Mallory-weiss, or esophageal rupture less likely. -troponin negative   Normocytic Normochromic Anemia: hgb 10.2 >> 8.1>9.0 this admission. No gross blood loss noted. May be a component of dilution given concurrent drop in other heme  lines, but concerned there may be another pathological process contributing. Will require further evaluation once more urgent issues resolved. MCV 90.7, HCT 25.4, Iron 71, TIBC 316, Ferritin 71, Folate 8.5. - monitor CBC twice daily  - transfuse < 8.0  - blood smear ready for review  Hypokalemia: K 3.4. Vomiting is most likely etiology.  - replete with IV KCl x 4 runs  - monitor BMP   AKI, resolved: Baseline Cr 1.3. Creatinine at admission 1.41>>>1.23 with hydration.  - continue IV fluids - monitor BMP   Tobacco use disorder: ~ 30 pack year history. Likely contributed to AAA above. He has quit successfully in the past for 8 months.  - nicotine patch  -  encourage smoking cessation   CAD/HLD/HTN: No chest pain or EKG changes. Trop negative. BP stable.  - hold ASA, fish oil, Lopressor, Crestor while NPO   Diet: Clear liquids   VTE ppx: Lovenox [place]   Code: Full   Dispo: Disposition is deferred at this time, awaiting improvement of current medical problems. Anticipated discharge in approximately 2-3 day(s) after surgery.     LOS: 4 days   [name]

## 2013-06-09 NOTE — Progress Notes (Addendum)
Subjective: No acute events overnight. Slept well all night.  Improvement in symptoms today.     Objective: Vital signs in last 24 hours: Filed Vitals:   06/08/13 0457 06/08/13 1449 06/08/13 2223 06/09/13 0545  BP: 125/64 128/59 145/68 130/61  Pulse: 61 66 70 58  Temp: 97.9 F (36.6 C) 99.3 F (37.4 C) 98.6 F (37 C) 98 F (36.7 C)  TempSrc:  Oral Oral Oral  Resp: 18 18 16 18   Height:      Weight:      SpO2: 100% 95% 97% 99%   Weight change:   Intake/Output Summary (Last 24 hours) at 06/09/13 0802 Last data filed at 06/08/13 2223  Gross per 24 hour  Intake 2077.5 ml  Output    300 ml  Net 1777.5 ml   General: laying in bed in NAD HEENT: no gross abnormality Cardiac: RRR, no rubs, gallops, + 2/6 systolic murmur Pulm: clear to auscultation bilaterally, moving normal volumes of air Abd: soft, nondistended, hypoactive BS, mild LLQ TTP (improved from yesterday) Ext: warm and well perfused, no pedal edema, SCDs in place Neuro: alert and oriented X3, responding appropriately, able to move all four extremities independently   Lab Results: Basic Metabolic Panel:  Recent Labs Lab 06/08/13 0840 06/09/13 0542  NA 140 137  K 3.5* 3.9  CL 106 103  CO2 22 22  GLUCOSE 117* 92  BUN 20 17  CREATININE 1.26 1.23  CALCIUM 7.8* 8.3*   Liver Function Tests:  Recent Labs Lab 06/05/13 0415 06/06/13 0550  AST 33 28  ALT 31 25  ALKPHOS 83 83  BILITOT 0.5 0.7  PROT 8.0 7.5  ALBUMIN 4.4 4.1    Recent Labs Lab 06/05/13 0415  LIPASE 51   CBC:  Recent Labs Lab 06/05/13 0415  06/07/13 0519  06/08/13 1720 06/09/13 0542  WBC 7.6  < > 5.6  < > 6.2 5.5  NEUTROABS 5.3  --  4.0  --   --   --   HGB 10.2*  < > 8.1*  < > 8.1* 9.0*  HCT 30.6*  < > 24.6*  < > 24.2* 27.8*  MCV 90.0  < > 90.1  < > 89.3 90.6  PLT 129*  < > 102*  < > 115* 118*  < > = values in this interval not displayed. Hemoglobin A1C:  Recent Labs Lab 06/05/13 2037  HGBA1C 5.6   Thyroid Function  Tests:  Recent Labs Lab 06/05/13 2037  TSH 1.550   Coagulation:  Recent Labs Lab 06/05/13 2037  LABPROT 15.3*  INR 1.24   Urinalysis:  Recent Labs Lab 06/05/13 1724 06/06/13 0050  COLORURINE YELLOW YELLOW  LABSPEC 1.013 1.009  PHURINE 7.0 6.5  GLUCOSEU NEGATIVE NEGATIVE  HGBUR NEGATIVE NEGATIVE  BILIRUBINUR NEGATIVE NEGATIVE  KETONESUR NEGATIVE NEGATIVE  PROTEINUR NEGATIVE NEGATIVE  UROBILINOGEN 0.2 0.2  NITRITE NEGATIVE NEGATIVE  LEUKOCYTESUR NEGATIVE NEGATIVE   Studies/Results: Ct Angio Chest Aortic Dissect W &/or W/o  06/07/2013   CLINICAL DATA:  Aortic aneurysm  EXAM: CT ANGIOGRAPHY CHEST, ABDOMEN AND PELVIS  TECHNIQUE: Multidetector CT imaging through the chest, abdomen and pelvis was performed using the standard protocol during bolus administration of intravenous contrast. Multiplanar reconstructed images and MIPs were obtained and reviewed to evaluate the vascular anatomy.  CONTRAST:  167mL OMNIPAQUE IOHEXOL 350 MG/ML SOLN  COMPARISON:  06/05/2013  FINDINGS: CTA CHEST FINDINGS  No precontrast images were done. Patchy coronary, aortic arch, and descending thoracic aortic calcifications. Adequate contrast opacification  of the thoracic aorta with no evidence of dissection, aneurysm, or stenosis. There is classic 3-vessel brachiocephalic arch anatomy without proximal stenosis. Good contrast opacification of pulmonary artery branches; the exam was not optimized for detection of pulmonary emboli. No pleural or pericardial effusion. Borderline enlarged AP window, prevascular, right hilar, and precarinal lymph nodes measuring up to the 11 mm short axis diameter. Subpleural blebs in both lung apices. Minimal dependent atelectasis posteriorly in both lower lobes with some patchy airspace consolidation or subsegmental atelectasis posterolaterally at the right lung base. Thoracic spine and sternum intact.  Review of the MIP images confirms the above findings.  CTA ABDOMEN AND PELVIS  FINDINGS  Arterial findings:  Aorta: Mild scattered calcified plaque in the suprarenal segment. Fusiform infrarenal aneurysm, 5.8 x 5.6 cm maximum transverse dimensions, tapering to a diameter of 3.4 cm at the bifurcation. Short infrarenal neck less than 3 cm. There is a large amount of intraluminal mural thrombus in the aneurysmal segment. No dissection or stenosis.  Celiac axis:         Patent.  Superior mesenteric: Patent, with replaced right hepatic arterial supply, an anatomic variant.  Left renal:          Single, patent.  Right renal:         Single, patent.  Inferior mesenteric: Short segment origin occlusion, reconstituted distally by visceral collaterals.  Left iliac: Eccentric calcified plaque through the common iliac artery. There is a short segment of at least 50% diameter stenosis at the origin of the common iliac artery. There is a web-like stenosis at the origin of the external iliac artery resulting greater than 50% diameter narrowing, with some mild plaque in the more distal external iliac artery but no tandem stenotic lesion. Eccentric plaque in the internal iliac artery which is patent. No aneurysm.  Right iliac: Calcified plaque throughout the common iliac artery without high-grade stenosis or aneurysm. There is mild eccentric plaque in the proximal internal iliac artery. External iliac widely patent. No aneurysm or dissection.  Venous findings: Dedicated venous phase imaging not obtained. Patent portal vein, splenic vein, bilateral renal veins.  Review of the MIP images confirms the above findings.  Nonvascular findings: Unremarkable arterial phase evaluation of liver. Spleen mildly enlarged, 16.2 cm maximum length. 19 mm left adrenal nodule. Pancreas and right adrenal gland unremarkable. Several subcentimeter partially calcified stones layer in the dependent aspect of the gallbladder without wall thickening or enhancement. Small bilateral renal cysts. No hydronephrosis. Stomach, small bowel,  and colon are nondilated. Innumerable distal descending and sigmoid diverticula. Persistent mild inflammatory change around the proximal sigmoid colon. No abscess. No free air. Urinary bladder is physiologically distended. Moderate prostatic enlargement with central coarse calcifications. No ascites. No adenopathy. Advanced degenerative disc disease L5-S1 with posterior protrusion.  IMPRESSION: 1. 5.8 cm fusiform infrarenal abdominal aortic aneurysm without evidence of rupture or other complicating features. Vascular surgery consultation recommended due to increased risk of rupture for AAA >5.5 cm. This recommendation follows ACR consensus. This recommendation follows ACR consensus guidelines: White Paper of the ACR Incidental Findings Committee II on Vascular Findings. J Am Coll Radiol 2013; 10:789-794. 2. Tandem stenoses in the proximal and distal left common iliac artery. 3. Nonspecific right hilar and mediastinal adenopathy. 4. Patchy airspace infiltrate in the posterolateral right lower lobe. 5. Splenomegaly 6. Cholelithiasis 7. Stable left adrenal nodule, probably benign adenoma in the absence of a history of primary carcinoma. 8. Stable mild sigmoid diverticulitis without abscess.   Electronically Signed  By: Arne Cleveland M.D.   On: 06/07/2013 11:27   Ct Angio Abd/pel W/ And/or W/o  06/07/2013   CLINICAL DATA:  Aortic aneurysm  EXAM: CT ANGIOGRAPHY CHEST, ABDOMEN AND PELVIS  TECHNIQUE: Multidetector CT imaging through the chest, abdomen and pelvis was performed using the standard protocol during bolus administration of intravenous contrast. Multiplanar reconstructed images and MIPs were obtained and reviewed to evaluate the vascular anatomy.  CONTRAST:  154mL OMNIPAQUE IOHEXOL 350 MG/ML SOLN  COMPARISON:  06/05/2013  FINDINGS: CTA CHEST FINDINGS  No precontrast images were done. Patchy coronary, aortic arch, and descending thoracic aortic calcifications. Adequate contrast opacification of the thoracic  aorta with no evidence of dissection, aneurysm, or stenosis. There is classic 3-vessel brachiocephalic arch anatomy without proximal stenosis. Good contrast opacification of pulmonary artery branches; the exam was not optimized for detection of pulmonary emboli. No pleural or pericardial effusion. Borderline enlarged AP window, prevascular, right hilar, and precarinal lymph nodes measuring up to the 11 mm short axis diameter. Subpleural blebs in both lung apices. Minimal dependent atelectasis posteriorly in both lower lobes with some patchy airspace consolidation or subsegmental atelectasis posterolaterally at the right lung base. Thoracic spine and sternum intact.  Review of the MIP images confirms the above findings.  CTA ABDOMEN AND PELVIS FINDINGS  Arterial findings:  Aorta: Mild scattered calcified plaque in the suprarenal segment. Fusiform infrarenal aneurysm, 5.8 x 5.6 cm maximum transverse dimensions, tapering to a diameter of 3.4 cm at the bifurcation. Short infrarenal neck less than 3 cm. There is a large amount of intraluminal mural thrombus in the aneurysmal segment. No dissection or stenosis.  Celiac axis:         Patent.  Superior mesenteric: Patent, with replaced right hepatic arterial supply, an anatomic variant.  Left renal:          Single, patent.  Right renal:         Single, patent.  Inferior mesenteric: Short segment origin occlusion, reconstituted distally by visceral collaterals.  Left iliac: Eccentric calcified plaque through the common iliac artery. There is a short segment of at least 50% diameter stenosis at the origin of the common iliac artery. There is a web-like stenosis at the origin of the external iliac artery resulting greater than 50% diameter narrowing, with some mild plaque in the more distal external iliac artery but no tandem stenotic lesion. Eccentric plaque in the internal iliac artery which is patent. No aneurysm.  Right iliac: Calcified plaque throughout the common iliac  artery without high-grade stenosis or aneurysm. There is mild eccentric plaque in the proximal internal iliac artery. External iliac widely patent. No aneurysm or dissection.  Venous findings: Dedicated venous phase imaging not obtained. Patent portal vein, splenic vein, bilateral renal veins.  Review of the MIP images confirms the above findings.  Nonvascular findings: Unremarkable arterial phase evaluation of liver. Spleen mildly enlarged, 16.2 cm maximum length. 19 mm left adrenal nodule. Pancreas and right adrenal gland unremarkable. Several subcentimeter partially calcified stones layer in the dependent aspect of the gallbladder without wall thickening or enhancement. Small bilateral renal cysts. No hydronephrosis. Stomach, small bowel, and colon are nondilated. Innumerable distal descending and sigmoid diverticula. Persistent mild inflammatory change around the proximal sigmoid colon. No abscess. No free air. Urinary bladder is physiologically distended. Moderate prostatic enlargement with central coarse calcifications. No ascites. No adenopathy. Advanced degenerative disc disease L5-S1 with posterior protrusion.  IMPRESSION: 1. 5.8 cm fusiform infrarenal abdominal aortic aneurysm without evidence of rupture or other  complicating features. Vascular surgery consultation recommended due to increased risk of rupture for AAA >5.5 cm. This recommendation follows ACR consensus. This recommendation follows ACR consensus guidelines: White Paper of the ACR Incidental Findings Committee II on Vascular Findings. J Am Coll Radiol 2013; 10:789-794. 2. Tandem stenoses in the proximal and distal left common iliac artery. 3. Nonspecific right hilar and mediastinal adenopathy. 4. Patchy airspace infiltrate in the posterolateral right lower lobe. 5. Splenomegaly 6. Cholelithiasis 7. Stable left adrenal nodule, probably benign adenoma in the absence of a history of primary carcinoma. 8. Stable mild sigmoid diverticulitis without  abscess.   Electronically Signed   By: Arne Cleveland M.D.   On: 06/07/2013 11:27   Medications: I have reviewed the patient's current medications. Scheduled Meds: . aspirin  81 mg Oral Daily  . enoxaparin (LOVENOX) injection  40 mg Subcutaneous Q24H  . nicotine  14 mg Transdermal Daily  . ondansetron (ZOFRAN) IV  8 mg Intravenous 4 times per day  . pantoprazole (PROTONIX) IV  40 mg Intravenous Q12H  . regadenoson  0.4 mg Intravenous Once  . sodium chloride  3 mL Intravenous Q12H   Continuous Infusions: . sodium chloride 150 mL/hr at 06/09/13 0615   PRN Meds:.HYDROmorphone (DILAUDID) injection, LORazepam, promethazine  Assessment/Plan: N/V/abdominal pain, possibly 2/2 diverticulitis: abd pain, N/V x no fever x 4 days, LLQ TTP; CT w/o contrast reveals diverticulitis at the descending/sigmoid junction without abscess.  Morphine is controlling pain.  His N/V has not responded to Reglan, Phenergan or Zofran (8mg ).  It is very important to get his N/V controlled to avoid increased abd pressure given AAA of 5.8cm.  GI consulted for assistance with managing intractable vomiting and recommendations appreciated.  Patient switched to scheduled Zofran with Phenergan and Ativan prn for breakthrough nausea, morphine switched to dilaudid.  CTA chest/abd/pelvis did not reveal an etiology for intractable vomiting, shows stable mild diverticulitis w/o abscess and 5.8cm AAA w/o evidence or rupture or complicating features.  Slept well all night.  Symptoms better controlled today.  - Zofran 8mg  q6h, phenergan prn and Ativan prn for nausea - Dilaudid for pain  - clear liquid diet (he is only drinking) - NSS at 150cc/hr - monitor CBC   AAA: CT abdomen reveals 5.8cm AAA. Has ~ 30 pack year smoking history. No signs of leak or rupture but > 5.5cm requires surgical repair. Vascular surgery consulted and do not feel the patient's symptoms are related to AAA. They would like CT chest,/abdomen/pelvis with contrast  once AKI resolved. Likely OR on 05/26.  Appreciate cardiology and vascular surgery recommendations. - Vasular Surgery will hold off on elective repair until acute issues resolve - urgent repair should he decompensate - pre-op lexiscan myoview once N/V/abd pain resolve - risk stratification - encourage smoking cessation; 05/2013 LDL 38 and patient is on Crestor  - continue ASA; hold Crestor until N/V resolve  Chest pain, resolved:  Central chest discomfort.  No radiation, no back pain.  Stat EKG - NSR, 60bpm, no acute ischemic findings, similar to prior.  Pain was likely 2/2 retching/vomiting.  Cardiology is following.  CE negative.  Anemia/thrombocytopenia/splenomegaly:  hgb 10.2 >> 8.1 this admission.  No gross blood loss noted.  MCV normal.  Stable at 9.0 today. - check LDH, haptoglobin, peripheral smear - will need further evaluation after resolution of acute illness - monitor CBC twice daily - transfuse < 8.0  Hypokalemia, resolved:  Vomiting is most likely etiology. - monitor BMP, replete K prn  AKI, resolved:  Baseline Cr 1.3. Creatinine at admission 1.41>>>1.23 with hydration. - continue IV fluids  - monitor BMP  Tobacco use disorder: ~ 30 pack year history. Likely contributed to AAA above. He has quit successfully in the past for 8 months.  - nicotine patch  - encourage smoking cessation   CAD/HLD/HTN: No chest pain or EKG changes. Trop negative. BP stable.  - he is on ASA; resume Crestor after N/V resolves - hold Lopressor in the setting of normotension  Diet: clear liquid   VTE ppx: Lovenox [place]  Code: Full  Dispo: Disposition is deferred at this time, awaiting improvement of current medical problems.  Anticipated discharge in approximately 2-3 day(s).   The patient does have a current PCP Sherren Mocha, MD) and does not need an [facility] hospital follow-up appointment after discharge.  The patient does not know have transportation limitations that hinder transportation to  clinic appointments.  .Services Needed at time of discharge: Y = Yes, Blank = No PT:   OT:   RN:   Equipment:   Other:     LOS: 4 days   Duwaine Maxin, DO 06/09/2013, 8:02 AM Attending physician note: I personally examined this patient and discussed his status with him and his wife. Physical findings, problem list, and management plan accurate as recorded above by resident physician Dr. Duwaine Maxin. His GI symptoms are subsiding. No vomiting episodes during the entire night. Tolerating small amounts of clear liquids. Abdomen is soft and minimally tender. He elected to defer cardiac Myoview study today and we rescheduled this for tomorrow. Once he is able to tolerate solid food and after his cardiac status has been reevaluated, we support him going home for a short interval prior to definitive surgery on his large aortic abdominal aneurysm. He has additional issues that we did not address with him at this time with respect to his anemia, mild thrombocytopenia, and  Splenomegaly. Perhaps a lymph node can be sampled at time of his aneurysm surgery.  Murriel Hopper, MD, [place]  Hematology-Oncology/Internal Medicine

## 2013-06-09 NOTE — Progress Notes (Addendum)
ANTIBIOTIC CONSULT NOTE - INITIAL  Pharmacy Consult for cefepime/vanc Indication: Line infection and possible phlebitis  No Known Allergies  Patient Measurements: Height: 5\' 10"  (177.8 cm) Weight: 199 lb (90.266 kg) IBW/kg (Calculated) : 73 Adjusted Body Weight:   Vital Signs: Temp: 98.9 F (37.2 C) (05/27 1400) Temp src: Oral (05/27 1400) BP: 125/68 mmHg (05/27 1400) Pulse Rate: 62 (05/27 1400) Intake/Output from previous day: 05/26 0701 - 05/27 0700 In: 2227.5 [I.V.:1627.5; IV Piggyback:600] Out: 300 [Urine:300] Intake/Output from this shift: Total I/O In: 50 [IV Piggyback:50] Out: 300 [Urine:300]  Labs:  Recent Labs  06/07/13 0519  06/08/13 0840 06/08/13 1720 06/09/13 0542 06/09/13 1700  WBC 5.6  < > 6.1 6.2 5.5 6.0  HGB 8.1*  < > 8.4* 8.1* 9.0* 8.6*  PLT 102*  < > 115* 115* 118* 136*  CREATININE 1.28  --  1.26  --  1.23  --   < > = values in this interval not displayed. Estimated Creatinine Clearance: 66.8 ml/min (by C-G formula based on Cr of 1.23). No results found for this basename: VANCOTROUGH, VANCOPEAK, VANCORANDOM, GENTTROUGH, GENTPEAK, GENTRANDOM, TOBRATROUGH, TOBRAPEAK, TOBRARND, AMIKACINPEAK, AMIKACINTROU, AMIKACIN,  in the last 72 hours   Microbiology: No results found for this or any previous visit (from the past 720 hour(s)).  Medical History: Past Medical History  Diagnosis Date  . Coronary atherosclerosis of native coronary artery     a. 10/2008 inf STEMI/PCI: LM 50d (IVUS-borderline lesion->med rx), LAD min irregs, LCX 79m, 70d, OM nl, RCA 172m (3.5x28 Vision BMS);  b. 11/2008 Lexiscan MV: EF 65%, no isch/scar.  . Essential hypertension, benign   . Pure hypercholesterolemia   . AAA (abdominal aortic aneurysm)     a. 05/2013 CT: 5.8 cm AAA.  Marland Kitchen Diverticulitis     a. 05/2013 CT: descending/sigmoid jxn w/o abscess.  . Osteoarthritis   . Tobacco abuse     a. ongoing - 1ppd for better part of 50 yrs.  . Normocytic anemia     Medications:   Scheduled:  . aspirin  81 mg Oral Daily  . enoxaparin (LOVENOX) injection  40 mg Subcutaneous Q24H  . nicotine  14 mg Transdermal Daily  . ondansetron (ZOFRAN) IV  8 mg Intravenous 4 times per day  . pantoprazole (PROTONIX) IV  40 mg Intravenous Q12H  . regadenoson  0.4 mg Intravenous Once  . sodium chloride  3 mL Intravenous Q12H   Infusions:  . sodium chloride 150 mL/hr at 06/09/13 7341   Assessment: 68 yo who came in on 5/23 for abd pain. He was started on cipro/flagyl to r/o diverticulitis. Found to have AAA repaired pending cardiac clearance. Cefepime has been ordered for possible line infection or phlebitis.   Goal:   Vanc trough 15-20  Plan:   Cefepime 2g IV q12 Vanc 1g IV q12 Vanc trough as needed F/u with culture

## 2013-06-09 NOTE — Progress Notes (Addendum)
[facility] [place]  Subjective  - the nausea is a little better.  He is planning to do the stress test today.   Objective 130/61 58 98 F (36.7 C) (Oral) 18 99%  Intake/Output Summary (Last 24 hours) at 06/09/13 0815 Last data filed at 06/08/13 2223  Gross per 24 hour  Intake 2077.5 ml  Output    300 ml  Net 1777.5 ml    Abdomin soft, slight tenderness  No change in exam  Assessment/Planning: Acute diverticulitis with intractable nausea and vomiting  AAA Pending cardiac clearance   [name] 06/09/2013 8:15 AM --  Laboratory Lab Results:  Recent Labs  06/08/13 1720 06/09/13 0542  WBC 6.2 5.5  HGB 8.1* 9.0*  HCT 24.2* 27.8*  PLT 115* 118*   BMET  Recent Labs  06/08/13 0840 06/09/13 0542  NA 140 137  K 3.5* 3.9  CL 106 103  CO2 22 22  GLUCOSE 117* 92  BUN 20 17  CREATININE 1.26 1.23  CALCIUM 7.8* 8.3*    COAG Lab Results  Component Value Date   INR 1.24 06/05/2013   INR 1.66* 10/20/2008   No results found for this basename: PTT      I agree with the above.  A little better today.  Still with abdominal pain to deep palpation.  Stress test today.  Wife at bedside this am.  Discussed waiting until acute issues are resolved until AAA repair.  Annamarie Major

## 2013-06-09 NOTE — Progress Notes (Signed)
  I have seen and examined the patient, and reviewed the daily progress note by Arrie Aran, MS 3 and discussed the care of the patient with them. Please see my progress note from 06/09/2013 for further details regarding assessment and plan.    Signed:  Duwaine Maxin, DO 06/09/2013, 3:06 PM

## 2013-06-09 NOTE — Progress Notes (Signed)
Pt IV of the L antecubital infiltrated this AM. Pt arm noted to be swollen and red this AM. Site d/ced at that time and new site obtained via IV team of the R forearm at 1400. RN called to room by pt and wife. Pt c/o increased pain, reddness and warmth of site. Pt has + radial and brachial pulses of L arm. LUE elevated on three pillows and ice applied. Rept called to MD on call. MD to come and see pt.

## 2013-06-10 ENCOUNTER — Inpatient Hospital Stay (HOSPITAL_COMMUNITY): Payer: Medicare Other

## 2013-06-10 ENCOUNTER — Encounter (HOSPITAL_COMMUNITY): Payer: Self-pay | Admitting: Oncology

## 2013-06-10 DIAGNOSIS — Y849 Medical procedure, unspecified as the cause of abnormal reaction of the patient, or of later complication, without mention of misadventure at the time of the procedure: Secondary | ICD-10-CM

## 2013-06-10 DIAGNOSIS — R079 Chest pain, unspecified: Secondary | ICD-10-CM

## 2013-06-10 DIAGNOSIS — T8579XA Infection and inflammatory reaction due to other internal prosthetic devices, implants and grafts, initial encounter: Secondary | ICD-10-CM

## 2013-06-10 DIAGNOSIS — L0390 Cellulitis, unspecified: Secondary | ICD-10-CM | POA: Diagnosis not present

## 2013-06-10 HISTORY — DX: Cellulitis, unspecified: L03.90

## 2013-06-10 LAB — BASIC METABOLIC PANEL
BUN: 15 mg/dL (ref 6–23)
CALCIUM: 7.8 mg/dL — AB (ref 8.4–10.5)
CO2: 22 mEq/L (ref 19–32)
CREATININE: 1.26 mg/dL (ref 0.50–1.35)
Chloride: 102 mEq/L (ref 96–112)
GFR calc Af Amer: 67 mL/min — ABNORMAL LOW (ref >90)
GFR, EST NON AFRICAN AMERICAN: 58 mL/min — AB (ref >90)
Glucose, Bld: 98 mg/dL (ref 70–99)
Potassium: 3.3 mEq/L — ABNORMAL LOW (ref 3.7–5.3)
SODIUM: 137 meq/L (ref 137–147)

## 2013-06-10 LAB — CBC
HCT: 23.8 % — ABNORMAL LOW (ref 39.0–52.0)
Hemoglobin: 7.9 g/dL — ABNORMAL LOW (ref 13.0–17.0)
MCH: 29.7 pg (ref 26.0–34.0)
MCHC: 33.2 g/dL (ref 30.0–36.0)
MCV: 89.5 fL (ref 78.0–100.0)
PLATELETS: 121 10*3/uL — AB (ref 150–400)
RBC: 2.66 MIL/uL — ABNORMAL LOW (ref 4.22–5.81)
RDW: 20.1 % — ABNORMAL HIGH (ref 11.5–15.5)
WBC: 5.4 10*3/uL (ref 4.0–10.5)

## 2013-06-10 MED ORDER — POTASSIUM CHLORIDE CRYS ER 20 MEQ PO TBCR
40.0000 meq | EXTENDED_RELEASE_TABLET | Freq: Once | ORAL | Status: AC
  Administered 2013-06-10: 40 meq via ORAL
  Filled 2013-06-10: qty 2

## 2013-06-10 MED ORDER — TECHNETIUM TC 99M SESTAMIBI GENERIC - CARDIOLITE
30.0000 | Freq: Once | INTRAVENOUS | Status: AC | PRN
  Administered 2013-06-10: 30 via INTRAVENOUS

## 2013-06-10 MED ORDER — VANCOMYCIN HCL IN DEXTROSE 1-5 GM/200ML-% IV SOLN
1000.0000 mg | Freq: Two times a day (BID) | INTRAVENOUS | Status: DC
  Administered 2013-06-10: 1000 mg via INTRAVENOUS
  Filled 2013-06-10 (×3): qty 200

## 2013-06-10 MED ORDER — TECHNETIUM TC 99M SESTAMIBI GENERIC - CARDIOLITE
10.0000 | Freq: Once | INTRAVENOUS | Status: AC | PRN
  Administered 2013-06-10: 10 via INTRAVENOUS

## 2013-06-10 MED ORDER — DEXTROSE 5 % IV SOLN
2.0000 g | INTRAVENOUS | Status: DC
  Administered 2013-06-10: 2 g via INTRAVENOUS
  Filled 2013-06-10: qty 2

## 2013-06-10 MED ORDER — ONDANSETRON 8 MG/NS 50 ML IVPB
8.0000 mg | Freq: Four times a day (QID) | INTRAVENOUS | Status: DC
  Administered 2013-06-10 – 2013-06-12 (×7): 8 mg via INTRAVENOUS
  Filled 2013-06-10 (×11): qty 8

## 2013-06-10 MED ORDER — REGADENOSON 0.4 MG/5ML IV SOLN
INTRAVENOUS | Status: AC
  Administered 2013-06-10: 0.4 mg via INTRAVENOUS
  Filled 2013-06-10: qty 5

## 2013-06-10 MED ORDER — ACETAMINOPHEN 325 MG PO TABS
650.0000 mg | ORAL_TABLET | Freq: Four times a day (QID) | ORAL | Status: DC | PRN
  Administered 2013-06-10: 650 mg via ORAL
  Filled 2013-06-10: qty 2

## 2013-06-10 MED ORDER — DEXTROSE 5 % IV SOLN
2.0000 g | Freq: Two times a day (BID) | INTRAVENOUS | Status: DC
  Administered 2013-06-10 – 2013-06-11 (×2): 2 g via INTRAVENOUS
  Filled 2013-06-10 (×3): qty 2

## 2013-06-10 NOTE — Progress Notes (Signed)
  [facility] Progress Note   In to see pt with Dr. Ardelle Park off floor.  Will return later today.  [name] 06/10/2013 9:35 AM

## 2013-06-10 NOTE — Progress Notes (Signed)
Subjective: No CP or nausea  Objective: Vital signs in last 24 hours: Temp:  [98.8 F (37.1 C)-100.9 F (38.3 C)] 98.8 F (37.1 C) (05/28 0532) Pulse Rate:  [62-69] 68 (05/28 0532) Resp:  [14-18] 18 (05/28 0532) BP: (125-127)/(65-69) 125/65 mmHg (05/28 0532) SpO2:  [97 %-98 %] 98 % (05/28 0532) Last BM Date: 06/02/13  Intake/Output from previous day: 05/27 0701 - 05/28 0700 In: 2750 [I.V.:2700; IV Piggyback:50] Out: 1250 [Urine:1250] Intake/Output this shift:    Medications Current Facility-Administered Medications  Medication Dose Route Frequency Provider Last Rate Last Dose  . 0.9 %  sodium chloride infusion   Intravenous Continuous Clinton Gallant, MD 150 mL/hr at 06/09/13 2105    . aspirin chewable tablet 81 mg  81 mg Oral Daily Serafina Mitchell, MD   81 mg at 06/09/13 1028  . ceFEPIme (MAXIPIME) 2 g in dextrose 5 % 50 mL IVPB  2 g Intravenous Q12H Annia Belt, MD   2 g at 06/09/13 2106  . enoxaparin (LOVENOX) injection 40 mg  40 mg Subcutaneous Q24H Clinton Gallant, MD   40 mg at 06/09/13 1831  . HYDROmorphone (DILAUDID) injection 0.5 mg  0.5 mg Intravenous Q3H PRN Clinton Gallant, MD   0.5 mg at 06/10/13 0656  . LORazepam (ATIVAN) injection 0.5 mg  0.5 mg Intravenous Q4H PRN Clinton Gallant, MD   0.5 mg at 06/09/13 2223  . nicotine (NICODERM CQ - dosed in mg/24 hours) patch 14 mg  14 mg Transdermal Daily Clinton Gallant, MD   14 mg at 06/09/13 1616  . ondansetron (ZOFRAN) 8 mg/NS 50 ml IVPB  8 mg Intravenous 4 times per day Annia Belt, MD      . pantoprazole (PROTONIX) injection 40 mg  40 mg Intravenous Q12H Clinton Gallant, MD   40 mg at 06/09/13 2223  . promethazine (PHENERGAN) injection 6.25 mg  6.25 mg Intravenous Q6H PRN Clinton Gallant, MD   6.25 mg at 06/08/13 2019  . regadenoson (LEXISCAN) 0.4 MG/5ML injection SOLN           . regadenoson (LEXISCAN) injection SOLN 0.4 mg  0.4 mg Intravenous Once Rogelia Mire, NP      . sodium chloride 0.9 % injection 3 mL  3 mL  Intravenous Q12H Clinton Gallant, MD   3 mL at 06/09/13 1000  . vancomycin (VANCOCIN) IVPB 1000 mg/200 mL premix  1,000 mg Intravenous Q12H Annia Belt, MD   1,000 mg at 06/09/13 2106    PE: General appearance: alert, cooperative and no distress Lungs: clear to auscultation bilaterally Heart: regular rate and rhythm and 1/6 sys mm Abdomen: +bs Extremities: No LEE Pulses: 2+ and symmetric Skin: warm and dry Neurologic: Grossly normal  Lab Results:   Recent Labs  06/09/13 0542 06/09/13 1700 06/10/13 0630  WBC 5.5 6.0 5.4  HGB 9.0* 8.6* 7.9*  HCT 27.8* 25.5* 23.8*  PLT 118* 136* 121*   BMET  Recent Labs  06/08/13 0840 06/09/13 0542 06/10/13 0630  NA 140 137 137  K 3.5* 3.9 3.3*  CL 106 103 102  CO2 22 22 22   GLUCOSE 117* 92 98  BUN 20 17 15   CREATININE 1.26 1.23 1.26  CALCIUM 7.8* 8.3* 7.8*    Studies/Results: @RISRSLT2 @   Assessment/Plan   1. CAD with hx old inferior MI. No recent chest pain.  2. AAA - new dx 5.8 cm  3. Acute diverticulitis with intractable nausea and vomiting  4. Tobacco abuse  5.  Anemia   Plan:  Asymptomatic during lexiscan myoview.  Nausea resolved.  Hgb has decreased to 7.9 today. May be dilutional.  Net fluids: +1.5l/+6.7L.   BP stable.    LOS: 5 days    Tarri Fuller PA-C 06/10/2013 8:56 AM  Agree with above.  The patient tolerated his Myoview without nausea or discomfort.  Await results of Myoview stress test to guide need for further cardiac studies.

## 2013-06-10 NOTE — Progress Notes (Signed)
  I have seen and examined the patient, and reviewed the daily progress note by Arrie Aran, MS 3 and discussed the care of the patient with them. Please see my progress note from 06/10/2013 for further details regarding assessment and plan.    Signed:  Duwaine Maxin, DO 06/10/2013, 2:28 PM

## 2013-06-10 NOTE — Progress Notes (Addendum)
Subjective: Developed pain and erythema at site of IV line yesterday.  Line removed and patient started on IV antibiotics.  Patient feels better today. Went for Myoview this AM.  No N/V in over 24 hours.  No BM but passing gas.  He wants to advance his diet.     Objective: Vital signs in last 24 hours: Filed Vitals:   06/10/13 0906 06/10/13 0907 06/10/13 0909 06/10/13 0911  BP: 132/66 155/60 139/68 138/69  Pulse:      Temp:      TempSrc:      Resp:      Height:      Weight:      SpO2:       Weight change:   Intake/Output Summary (Last 24 hours) at 06/10/13 1307 Last data filed at 06/10/13 2409  Gross per 24 hour  Intake   2750 ml  Output    950 ml  Net   1800 ml   General: laying in bed in NAD HEENT: no gross abnormality Cardiac: RRR, no rubs, gallops, + 2/6 systolic murmur Pulm: clear to auscultation bilaterally, moving normal volumes of air Abd: soft, nondistended, +BS, non-tender (improvement) Ext: warm and well perfused, no pedal edema, SCDs in place Skin:  Left antecubital fossa erythematous, warm extending up the left arm Neuro: alert and oriented X3, responding appropriately, able to move all four extremities independently   Lab Results: Basic Metabolic Panel:  Recent Labs Lab 06/09/13 0542 06/10/13 0630  NA 137 137  K 3.9 3.3*  CL 103 102  CO2 22 22  GLUCOSE 92 98  BUN 17 15  CREATININE 1.23 1.26  CALCIUM 8.3* 7.8*   Liver Function Tests:  Recent Labs Lab 06/05/13 0415 06/06/13 0550  AST 33 28  ALT 31 25  ALKPHOS 83 83  BILITOT 0.5 0.7  PROT 8.0 7.5  ALBUMIN 4.4 4.1    Recent Labs Lab 06/05/13 0415  LIPASE 51   CBC:  Recent Labs Lab 06/05/13 0415  06/07/13 0519  06/09/13 1700 06/10/13 0630  WBC 7.6  < > 5.6  < > 6.0 5.4  NEUTROABS 5.3  --  4.0  --   --   --   HGB 10.2*  < > 8.1*  < > 8.6* 7.9*  HCT 30.6*  < > 24.6*  < > 25.5* 23.8*  MCV 90.0  < > 90.1  < > 89.5 89.5  PLT 129*  < > 102*  < > 136* 121*  < > = values in this  interval not displayed. Hemoglobin A1C:  Recent Labs Lab 06/05/13 2037  HGBA1C 5.6   Thyroid Function Tests:  Recent Labs Lab 06/05/13 2037  TSH 1.550   Coagulation:  Recent Labs Lab 06/05/13 2037  LABPROT 15.3*  INR 1.24   Urinalysis:  Recent Labs Lab 06/05/13 1724 06/06/13 0050  COLORURINE YELLOW YELLOW  LABSPEC 1.013 1.009  PHURINE 7.0 6.5  GLUCOSEU NEGATIVE NEGATIVE  HGBUR NEGATIVE NEGATIVE  BILIRUBINUR NEGATIVE NEGATIVE  KETONESUR NEGATIVE NEGATIVE  PROTEINUR NEGATIVE NEGATIVE  UROBILINOGEN 0.2 0.2  NITRITE NEGATIVE NEGATIVE  LEUKOCYTESUR NEGATIVE NEGATIVE   Studies/Results: No results found. Medications: I have reviewed the patient's current medications. Scheduled Meds: . aspirin  81 mg Oral Daily  . cefTRIAXone (ROCEPHIN)  IV  2 g Intravenous Q24H  . enoxaparin (LOVENOX) injection  40 mg Subcutaneous Q24H  . nicotine  14 mg Transdermal Daily  . ondansetron (ZOFRAN) IV  8 mg Intravenous 4 times per day  .  pantoprazole (PROTONIX) IV  40 mg Intravenous Q12H  . sodium chloride  3 mL Intravenous Q12H   Continuous Infusions: . sodium chloride 150 mL/hr at 06/09/13 2105   PRN Meds:.HYDROmorphone (DILAUDID) injection, LORazepam, promethazine  Assessment/Plan: N/V/abdominal pain, possibly 2/2 diverticulitis: abd pain, N/V x no fever x 4 days, LLQ TTP; CT w/o contrast reveals diverticulitis at the descending/sigmoid junction without abscess.  Morphine is controlling pain.  His N/V has not responded to Reglan, Phenergan or Zofran (8mg ).  It is very important to get his N/V controlled to avoid increased abd pressure given AAA of 5.8cm.  GI consulted for assistance with managing intractable vomiting and recommendations appreciated.  Patient switched to scheduled Zofran with Phenergan and Ativan prn for breakthrough nausea, morphine switched to dilaudid.  CTA chest/abd/pelvis did not reveal an etiology for intractable vomiting, shows stable mild diverticulitis w/o  abscess and 5.8cm AAA w/o evidence or rupture or complicating features.  Feeling better.  N/V improved.  Wants to advance his diet.  He is net + 6.7L since admission. - Zofran 8mg  q6h, phenergan prn and Ativan prn for nausea - Dilaudid for pain  - clear liquid diet --> soft diet - d/c fluids since he net pos 6.7L and now eating/drinking better - monitor CBC   AAA: CT abdomen reveals 5.8cm AAA. Has ~ 30 pack year smoking history. No signs of leak or rupture but > 5.5cm requires surgical repair. Vascular surgery consulted and do not feel the patient's symptoms are related to AAA. They would like CT chest,/abdomen/pelvis with contrast once AKI resolved. Likely OR on 05/26.  Appreciate cardiology and vascular surgery recommendations.  Pre-op myoview completed 05/28. - Vasular Surgery will hold off on elective repair until acute issues resolve (likely end of next week) - urgent repair should he decompensate - f/u myoview report - risk stratification - encourage smoking cessation; 05/2013 LDL 38 and patient is on Crestor  - continue ASA; hold Crestor until N/V resolve  Peripheral catheter-related cellulitis:  Erythematous and warmth at site of prior peripheral IV.  Does not seem to be causing him much discomfort at present.  Afebrile at present and normal WBC. - IV antibiotics - narrow vanc and cefepime --> ceftriaxone q24h - monitor WBC and for fever - f/u blood cultures  Chest pain, resolved:  Central chest discomfort.  No radiation, no back pain.  Stat EKG - NSR, 60bpm, no acute ischemic findings, similar to prior.  Pain was likely 2/2 retching/vomiting.  Cardiology is following.  CE negative.  Anemia/thrombocytopenia/splenomegaly:  hgb 10.2 >> 7.9 this admission.  No gross blood loss noted.  MCV normal.  Haptoglobin 223 and LDH 213 so doubt hemolysis.  Recent dip likely dilutional as other heme lines have also dropped and patient is getting IV fluids at 150cc/hr.    - will need further evaluation  after resolution of acute illness - d/c IV fluids - monitor CBC  Hypokalemia:  Vomiting is most likely etiology. - KDur 40 mEq - monitor BMP, replete K prn  AKI, resolved: Baseline Cr 1.3. Creatinine at admission 1.41>>>1.23 with hydration. - d/c IV fluids  - monitor BMP  Tobacco use disorder: ~ 30 pack year history. Likely contributed to AAA above. He has quit successfully in the past for 8 months.  - nicotine patch  - encourage smoking cessation   CAD/HLD/HTN: No chest pain or EKG changes. Trop negative. BP stable.  - he is on ASA; resume Crestor after N/V resolves - hold Lopressor in the setting  of normotension  Diet: soft diet VTE ppx: Lovenox [place]  Code: Full  Dispo: Disposition is deferred at this time, awaiting improvement of current medical problems.  Anticipated discharge in approximately 2-3 day(s).   The patient does have a current PCP Sherren Mocha, MD) and does not need an [facility] hospital follow-up appointment after discharge.  The patient does not know have transportation limitations that hinder transportation to clinic appointments.  .Services Needed at time of discharge: Y = Yes, Blank = No PT:   OT:   RN:   Equipment:   Other:     LOS: 5 days   Duwaine Maxin, DO 06/10/2013, 1:07 PM Attending physician note: I personally interviewed and examined this patient today and discussed his status with him and his wife. Findings are accurate as recorded above by resident physician Dr. Duwaine Maxin. GI symptoms finally subsiding. The patient has developed a new, extensive, left antecubital cellulitis at site of recent intravenous line. Antibiotic therapy has been started. Vascular surgeon at bedside this morning when we made rounds. He anticipates taking the patient to surgery late next week. Murriel Hopper, MD, [place]  Hematology-Oncology/Internal Medicine

## 2013-06-10 NOTE — Progress Notes (Addendum)
Rept to MD on call regarding pt is running a temp 102. 6. No s/sx of resp distress or change in AM assessment. Pt is sitting up in bed visiting with family. Pt  oxygen sat 96% on RA.  Most recent WBC normal. Last bld cultures drawn yesterday. Orders received for tylenol. Pt given IS and pt is able to raise IS to 1000 ml. Will set his goal for 1500 ml. Pt has a history of smoking.  Will continue to monitor.

## 2013-06-10 NOTE — Progress Notes (Signed)
Rept to Dr. Redmond Pulling. Pt repts only one "spit up" episode in 2 days. Pt requests to be placed on a regular diet. Also, rept to MD that pt repts the hasn't had a BM since 5/20. Pt is passing gas today and abdomen appears less distended. Will continue to monitor.

## 2013-06-10 NOTE — Progress Notes (Signed)
ANTIBIOTIC CONSULT NOTE - INITIAL  Pharmacy Consult for cefepime/vanc Indication: Line infection and possible phlebitis  No Known Allergies  Patient Measurements: Height: 5\' 10"  (177.8 cm) Weight: 207 lb (93.895 kg) IBW/kg (Calculated) : 73 Adjusted Body Weight:   Vital Signs: Temp: 101.4 F (38.6 C) (05/28 1839) Temp src: Oral (05/28 1715) BP: 128/64 mmHg (05/28 1715) Pulse Rate: 82 (05/28 1715) Intake/Output from previous day: 05/27 0701 - 05/28 0700 In: 2750 [I.V.:2700; IV Piggyback:50] Out: 1250 [Urine:1250] Intake/Output from this shift:    Labs:  Recent Labs  06/08/13 0840  06/09/13 0542 06/09/13 1700 06/10/13 0630  WBC 6.1  < > 5.5 6.0 5.4  HGB 8.4*  < > 9.0* 8.6* 7.9*  PLT 115*  < > 118* 136* 121*  CREATININE 1.26  --  1.23  --  1.26  < > = values in this interval not displayed. Estimated Creatinine Clearance: 66.4 ml/min (by C-G formula based on Cr of 1.26). No results found for this basename: VANCOTROUGH, VANCOPEAK, VANCORANDOM, GENTTROUGH, GENTPEAK, GENTRANDOM, TOBRATROUGH, TOBRAPEAK, TOBRARND, AMIKACINPEAK, AMIKACINTROU, AMIKACIN,  in the last 72 hours   Microbiology: No results found for this or any previous visit (from the past 720 hour(s)).  Medical History: Past Medical History  Diagnosis Date  . Coronary atherosclerosis of native coronary artery     a. 10/2008 inf STEMI/PCI: LM 50d (IVUS-borderline lesion->med rx), LAD min irregs, LCX 78m, 70d, OM nl, RCA 116m (3.5x28 Vision BMS);  b. 11/2008 Lexiscan MV: EF 65%, no isch/scar.  . Essential hypertension, benign   . Pure hypercholesterolemia   . AAA (abdominal aortic aneurysm)     a. 05/2013 CT: 5.8 cm AAA.  Marland Kitchen Diverticulitis     a. 05/2013 CT: descending/sigmoid jxn w/o abscess.  . Osteoarthritis   . Tobacco abuse     a. ongoing - 1ppd for better part of 50 yrs.  . Normocytic anemia   . Cellulitis 06/10/2013    Right antecubital fossa at site of IV  03/12/13    Medications:  Scheduled:  .  aspirin  81 mg Oral Daily  . enoxaparin (LOVENOX) injection  40 mg Subcutaneous Q24H  . nicotine  14 mg Transdermal Daily  . ondansetron (ZOFRAN) IV  8 mg Intravenous 4 times per day  . pantoprazole (PROTONIX) IV  40 mg Intravenous Q12H  . sodium chloride  3 mL Intravenous Q12H   Infusions:    Assessment: 67 yo who came in on 5/23 for abd pain. He was started on cipro/flagyl to r/o diverticulitis. Found to have AAA repaired pending cardiac clearance. Cefepime has been ordered for possible line infection or phlebitis. Abx were dced this AM but pt cont to have fever so they are being restarted tonight.   Goal:   Vanc trough 15-20  Plan:   Cefepime 2g IV q12 Vanc 1g IV q12 Vanc trough as needed F/u with culture

## 2013-06-10 NOTE — Progress Notes (Signed)
Subjective: Mr. Teichert IV was infiltrated yesterday AM, IV replaced.  Developed pain and erythema around previous IV site, examination revealed TTP and warm to touch.  Temp at time 100.8, WBC 6.0.  Started on Cefepime and Vancomycin for presumed line infection.    Mr. Debold felt his nausea was sufficiently controlled for stress test, completed this morning.  Sitting up comfortably in bed, feels ready to eat solids.   Objective: Vital signs in last 24 hours: Temp:  [98.8 F (37.1 C)-100.9 F (38.3 C)] 98.8 F (37.1 C) (05/28 0532) Pulse Rate:  [62-69] 68 (05/28 0532) Resp:  [14-18] 18 (05/28 0532) BP: (125-155)/(60-69) 138/69 mmHg (05/28 0911) SpO2:  [97 %-98 %] 98 % (05/28 0532)  Intake/Output from previous day: 05/27 0701 - 05/28 0700 In: 2750 [I.V.:2700] Out: 1250 [Urine:1250] Intake/Output this shift:   Physical Exam:  General appearance: alert, appears stated age, no distress and well-appearing today Head: Normocephalic, without obvious abnormality, atraumatic  Throat: MMM  Lungs: clear to auscultation bilaterally  Abdomen: BS present, soft, NTND  Extremities: Left antecubital fossa mildly swollen, erythematous (diameter ~15cm), warm to touch, pain improved Pulses: 2+ throughout Neuro: Euthymic    Results for orders placed during the hospital encounter of 06/05/13 (from the past 24 hour(s))  CBC     Status: Abnormal   Collection Time    06/09/13  5:00 PM      Result Value Ref Range   WBC 6.0  4.0 - 10.5 K/uL   RBC 2.85 (*) 4.22 - 5.81 MIL/uL   Hemoglobin 8.6 (*) 13.0 - 17.0 g/dL   HCT 25.5 (*) 39.0 - 52.0 %   MCV 89.5  78.0 - 100.0 fL   MCH 30.2  26.0 - 34.0 pg   MCHC 33.7  30.0 - 36.0 g/dL   RDW 20.6 (*) 11.5 - 15.5 %   Platelets 136 (*) 150 - 400 K/uL  CBC     Status: Abnormal   Collection Time    06/10/13  6:30 AM      Result Value Ref Range   WBC 5.4  4.0 - 10.5 K/uL   RBC 2.66 (*) 4.22 - 5.81 MIL/uL   Hemoglobin 7.9 (*) 13.0 - 17.0 g/dL   HCT 23.8 (*)  39.0 - 52.0 %   MCV 89.5  78.0 - 100.0 fL   MCH 29.7  26.0 - 34.0 pg   MCHC 33.2  30.0 - 36.0 g/dL   RDW 20.1 (*) 11.5 - 15.5 %   Platelets 121 (*) 150 - 400 K/uL  BASIC METABOLIC PANEL     Status: Abnormal   Collection Time    06/10/13  6:30 AM      Result Value Ref Range   Sodium 137  137 - 147 mEq/L   Potassium 3.3 (*) 3.7 - 5.3 mEq/L   Chloride 102  96 - 112 mEq/L   CO2 22  19 - 32 mEq/L   Glucose, Bld 98  70 - 99 mg/dL   BUN 15  6 - 23 mg/dL   Creatinine, Ser 1.26  0.50 - 1.35 mg/dL   Calcium 7.8 (*) 8.4 - 10.5 mg/dL   GFR calc non Af Amer 58 (*) >90 mL/min   GFR calc Af Amer 67 (*) >90 mL/min    Studies/Results: Ct Abdomen Pelvis Wo Contrast  06/05/2013   CLINICAL DATA:  Abdominal pain and cramping.  Hematuria.  EXAM: CT ABDOMEN AND PELVIS WITHOUT CONTRAST  TECHNIQUE: Multidetector CT imaging of the abdomen and  pelvis was performed following the standard protocol without IV contrast.  COMPARISON:  None.  FINDINGS: Minimal dependent atelectasis in the visualized lung bases. Coronary calcification. Unremarkable liver, spleen, right adrenal gland, pancreas. Unenhanced CT was performed per clinician order. Lack of IV contrast limits sensitivity and specificity, especially for evaluation of abdominal/pelvic solid viscera.  At least 2 subcentimeter partially calcified stones layer in the dependent aspect of the nondilated gallbladder. 15 mm low-attenuation left adrenal nodule, probably benign adenomas in the absence of history of primary carcinoma. . Small probable cysts in the upper pole left kidney and from the lower pole right kidney. No hydronephrosis.  Fusiform distal aortic aneurysm measuring up to 5.8 cm maximum transverse diameter, tapering to a diameter of 3.6 cm at the bifurcation. Iliac arteries are atheromatous but non aneurysmal. There is no evidence of retroperitoneal hemorrhage nor hyperdense crescent to suggest impending rupture.  Stomach, small bowel, colon are nondilated.  Multiple descending and sigmoid diverticula, with some mild inflammatory/edematous changes around the descending/sigmoid junction. No abscess. No free air. No ascites. Urinary bladder physiologically distended. Moderate prostatic enlargement with central coarse calcifications. No adenopathy.  IMPRESSION: 1. Diverticulitis at the descending/sigmoid junction without abscess. 2. 5.8 cm abdominal aortic aneurysm. Vascular surgery consultation recommended due to increased risk of rupture for AAA >5.5 cm. This recommendation follows ACR consensus. This recommendation follows ACR consensus guidelines: White Paper of the ACR Incidental Findings Committee II on Vascular Findings. J Am Coll Radiol 2013; 10:789-794.   Electronically Signed   By: Arne Cleveland M.D.   On: 06/05/2013 12:52   Dg Chest 2 View  06/05/2013   CLINICAL DATA:  Vomiting and facial swelling.  EXAM: CHEST  2 VIEW  COMPARISON:  Chest radiograph performed 10/20/2008  FINDINGS: The lungs are well-aerated and clear. There is no evidence of focal opacification, pleural effusion or pneumothorax.  The heart is normal in size; the mediastinal contour is within normal limits. No acute osseous abnormalities are seen.  IMPRESSION: No acute cardiopulmonary process seen.   Electronically Signed   By: Garald Balding M.D.   On: 06/05/2013 04:47   Ct Angio Chest Aortic Dissect W &/or W/o  06/07/2013   CLINICAL DATA:  Aortic aneurysm  EXAM: CT ANGIOGRAPHY CHEST, ABDOMEN AND PELVIS  TECHNIQUE: Multidetector CT imaging through the chest, abdomen and pelvis was performed using the standard protocol during bolus administration of intravenous contrast. Multiplanar reconstructed images and MIPs were obtained and reviewed to evaluate the vascular anatomy.  CONTRAST:  167mL OMNIPAQUE IOHEXOL 350 MG/ML SOLN  COMPARISON:  06/05/2013  FINDINGS: CTA CHEST FINDINGS  No precontrast images were done. Patchy coronary, aortic arch, and descending thoracic aortic calcifications.  Adequate contrast opacification of the thoracic aorta with no evidence of dissection, aneurysm, or stenosis. There is classic 3-vessel brachiocephalic arch anatomy without proximal stenosis. Good contrast opacification of pulmonary artery branches; the exam was not optimized for detection of pulmonary emboli. No pleural or pericardial effusion. Borderline enlarged AP window, prevascular, right hilar, and precarinal lymph nodes measuring up to the 11 mm short axis diameter. Subpleural blebs in both lung apices. Minimal dependent atelectasis posteriorly in both lower lobes with some patchy airspace consolidation or subsegmental atelectasis posterolaterally at the right lung base. Thoracic spine and sternum intact.  Review of the MIP images confirms the above findings.  CTA ABDOMEN AND PELVIS FINDINGS  Arterial findings:  Aorta: Mild scattered calcified plaque in the suprarenal segment. Fusiform infrarenal aneurysm, 5.8 x 5.6 cm maximum transverse dimensions, tapering to  a diameter of 3.4 cm at the bifurcation. Short infrarenal neck less than 3 cm. There is a large amount of intraluminal mural thrombus in the aneurysmal segment. No dissection or stenosis.  Celiac axis:         Patent.  Superior mesenteric: Patent, with replaced right hepatic arterial supply, an anatomic variant.  Left renal:          Single, patent.  Right renal:         Single, patent.  Inferior mesenteric: Short segment origin occlusion, reconstituted distally by visceral collaterals.  Left iliac: Eccentric calcified plaque through the common iliac artery. There is a short segment of at least 50% diameter stenosis at the origin of the common iliac artery. There is a web-like stenosis at the origin of the external iliac artery resulting greater than 50% diameter narrowing, with some mild plaque in the more distal external iliac artery but no tandem stenotic lesion. Eccentric plaque in the internal iliac artery which is patent. No aneurysm.  Right  iliac: Calcified plaque throughout the common iliac artery without high-grade stenosis or aneurysm. There is mild eccentric plaque in the proximal internal iliac artery. External iliac widely patent. No aneurysm or dissection.  Venous findings: Dedicated venous phase imaging not obtained. Patent portal vein, splenic vein, bilateral renal veins.  Review of the MIP images confirms the above findings.  Nonvascular findings: Unremarkable arterial phase evaluation of liver. Spleen mildly enlarged, 16.2 cm maximum length. 19 mm left adrenal nodule. Pancreas and right adrenal gland unremarkable. Several subcentimeter partially calcified stones layer in the dependent aspect of the gallbladder without wall thickening or enhancement. Small bilateral renal cysts. No hydronephrosis. Stomach, small bowel, and colon are nondilated. Innumerable distal descending and sigmoid diverticula. Persistent mild inflammatory change around the proximal sigmoid colon. No abscess. No free air. Urinary bladder is physiologically distended. Moderate prostatic enlargement with central coarse calcifications. No ascites. No adenopathy. Advanced degenerative disc disease L5-S1 with posterior protrusion.  IMPRESSION: 1. 5.8 cm fusiform infrarenal abdominal aortic aneurysm without evidence of rupture or other complicating features. Vascular surgery consultation recommended due to increased risk of rupture for AAA >5.5 cm. This recommendation follows ACR consensus. This recommendation follows ACR consensus guidelines: White Paper of the ACR Incidental Findings Committee II on Vascular Findings. J Am Coll Radiol 2013; 10:789-794. 2. Tandem stenoses in the proximal and distal left common iliac artery. 3. Nonspecific right hilar and mediastinal adenopathy. 4. Patchy airspace infiltrate in the posterolateral right lower lobe. 5. Splenomegaly 6. Cholelithiasis 7. Stable left adrenal nodule, probably benign adenoma in the absence of a history of primary  carcinoma. 8. Stable mild sigmoid diverticulitis without abscess.   Electronically Signed   By: Arne Cleveland M.D.   On: 06/07/2013 11:27   Ct Angio Abd/pel W/ And/or W/o  06/07/2013   CLINICAL DATA:  Aortic aneurysm  EXAM: CT ANGIOGRAPHY CHEST, ABDOMEN AND PELVIS  TECHNIQUE: Multidetector CT imaging through the chest, abdomen and pelvis was performed using the standard protocol during bolus administration of intravenous contrast. Multiplanar reconstructed images and MIPs were obtained and reviewed to evaluate the vascular anatomy.  CONTRAST:  138mL OMNIPAQUE IOHEXOL 350 MG/ML SOLN  COMPARISON:  06/05/2013  FINDINGS: CTA CHEST FINDINGS  No precontrast images were done. Patchy coronary, aortic arch, and descending thoracic aortic calcifications. Adequate contrast opacification of the thoracic aorta with no evidence of dissection, aneurysm, or stenosis. There is classic 3-vessel brachiocephalic arch anatomy without proximal stenosis. Good contrast opacification of pulmonary artery branches; the  exam was not optimized for detection of pulmonary emboli. No pleural or pericardial effusion. Borderline enlarged AP window, prevascular, right hilar, and precarinal lymph nodes measuring up to the 11 mm short axis diameter. Subpleural blebs in both lung apices. Minimal dependent atelectasis posteriorly in both lower lobes with some patchy airspace consolidation or subsegmental atelectasis posterolaterally at the right lung base. Thoracic spine and sternum intact.  Review of the MIP images confirms the above findings.  CTA ABDOMEN AND PELVIS FINDINGS  Arterial findings:  Aorta: Mild scattered calcified plaque in the suprarenal segment. Fusiform infrarenal aneurysm, 5.8 x 5.6 cm maximum transverse dimensions, tapering to a diameter of 3.4 cm at the bifurcation. Short infrarenal neck less than 3 cm. There is a large amount of intraluminal mural thrombus in the aneurysmal segment. No dissection or stenosis.  Celiac axis:          Patent.  Superior mesenteric: Patent, with replaced right hepatic arterial supply, an anatomic variant.  Left renal:          Single, patent.  Right renal:         Single, patent.  Inferior mesenteric: Short segment origin occlusion, reconstituted distally by visceral collaterals.  Left iliac: Eccentric calcified plaque through the common iliac artery. There is a short segment of at least 50% diameter stenosis at the origin of the common iliac artery. There is a web-like stenosis at the origin of the external iliac artery resulting greater than 50% diameter narrowing, with some mild plaque in the more distal external iliac artery but no tandem stenotic lesion. Eccentric plaque in the internal iliac artery which is patent. No aneurysm.  Right iliac: Calcified plaque throughout the common iliac artery without high-grade stenosis or aneurysm. There is mild eccentric plaque in the proximal internal iliac artery. External iliac widely patent. No aneurysm or dissection.  Venous findings: Dedicated venous phase imaging not obtained. Patent portal vein, splenic vein, bilateral renal veins.  Review of the MIP images confirms the above findings.  Nonvascular findings: Unremarkable arterial phase evaluation of liver. Spleen mildly enlarged, 16.2 cm maximum length. 19 mm left adrenal nodule. Pancreas and right adrenal gland unremarkable. Several subcentimeter partially calcified stones layer in the dependent aspect of the gallbladder without wall thickening or enhancement. Small bilateral renal cysts. No hydronephrosis. Stomach, small bowel, and colon are nondilated. Innumerable distal descending and sigmoid diverticula. Persistent mild inflammatory change around the proximal sigmoid colon. No abscess. No free air. Urinary bladder is physiologically distended. Moderate prostatic enlargement with central coarse calcifications. No ascites. No adenopathy. Advanced degenerative disc disease L5-S1 with posterior protrusion.   IMPRESSION: 1. 5.8 cm fusiform infrarenal abdominal aortic aneurysm without evidence of rupture or other complicating features. Vascular surgery consultation recommended due to increased risk of rupture for AAA >5.5 cm. This recommendation follows ACR consensus. This recommendation follows ACR consensus guidelines: White Paper of the ACR Incidental Findings Committee II on Vascular Findings. J Am Coll Radiol 2013; 10:789-794. 2. Tandem stenoses in the proximal and distal left common iliac artery. 3. Nonspecific right hilar and mediastinal adenopathy. 4. Patchy airspace infiltrate in the posterolateral right lower lobe. 5. Splenomegaly 6. Cholelithiasis 7. Stable left adrenal nodule, probably benign adenoma in the absence of a history of primary carcinoma. 8. Stable mild sigmoid diverticulitis without abscess.   Electronically Signed   By: Arne Cleveland M.D.   On: 06/07/2013 11:27    Scheduled Meds: . aspirin  81 mg Oral Daily  . ceFEPime (MAXIPIME) IV  2 g  Intravenous Q12H  . enoxaparin (LOVENOX) injection  40 mg Subcutaneous Q24H  . nicotine  14 mg Transdermal Daily  . ondansetron (ZOFRAN) IV  8 mg Intravenous 4 times per day  . pantoprazole (PROTONIX) IV  40 mg Intravenous Q12H  . sodium chloride  3 mL Intravenous Q12H  . vancomycin  1,000 mg Intravenous Q12H   Continuous Infusions: . sodium chloride 150 mL/hr at 06/09/13 2105   PRN Meds:HYDROmorphone (DILAUDID) injection, LORazepam, promethazine  Assessment/Plan: Diverticulitis: abd pain, N/V, LLQ TTP; CT w/ contrast reveals "mild" diverticulitis at the descending/sigmoid junction without abscess, mild splenomegaly. Dilaudid is controlling pain. His N/V is slowly improving with Zofran Phenergan or Zofran (8mg ). It is very important to get his N/V controlled to avoid increased abd pressure given AAA of 5.8cm. Questionable whether diverticulitis is causing his symptoms, given that his clinical exam is disproportionate to CT findings, so we  D/C'd antibiotics for concerns this was contributing to intractable n/v. He has not had BM in several days and reports no flatus.  - CTA chest/abd/pelvis: confirmed infrarenal AAA, mild splenomegaly, mild sigmoid diverticulitis, stable left adrenal nodule, short segment inferior mesenteric origin occlusion, reconstituted distally by visceral collaterals.  - consult to GI for assistance with managing intractable vomiting, appreciate req's: advised to start clear liquids, schedule Zofran 8mg  q6h, switch morphine>dilaudid, and check anemia panel  - cipro and flagyl discontinued 5/26 2/2 n/v - zofran 8mg  q6h for nausea  - phenergan 6.25mg  q6h PRN for break through nausea, will consider scheduling if ABx D/C does not improve Sxs  - dilaudid for pain  - clear liquid diet  - D/C NSS at 150cc/hr, he is eating and drinking now, will likely not require maintenance fluids - monitor CBC   AAA: CT abdomen reveals 5.8cm AAA. Has ~ 30 pack year smoking history. No signs of leak or rupture but > 5.5cm requires surgical repair. Vascular surgery consulted and do not feel the patient's symptoms are related to AAA. CT chest/abd/pelvis obtained; will do repair once GI symptoms are controlled. Likely OR on 6/4-5. Appreciate cardiology and vascular surgery recommendations.  - CTA chest and abd/pelvis with contrast did not show any acute abdominal process  - carotid dopplers, and lower extremity doppler results show no obvious stenosis in carotids or left leg, 20-49% stenosis in R Femoral artery.  - urgent repair should he decompensate  - pre-op lexiscan myoview currently underway - risk stratification - encourage smoking cessation; 05/2013 LDL 38 and patient is on Crestor  - continue ASA, fish oil, Crestor once patient resumes po   Line Infection: Pt c/o painful, swollen left arm, on exam was erythematous, TTP, and warm around previous IV site. No crepitus was appreciated.  Temp 100/8, WBC 5.5>6.0.  Likely line  infection, phlebitis vs. cellulitis -D/c Cefepime and vancomycin - Start IV Rocephin 2g q24h - Blood Cx x2 pending, will FU on sensitivities  Chest Pain: Likely due to MSK pain 2/2 intractable vomiting. EKG showed no changes, no signs of respiratory distress, no blood in vomitus, making more serious causes like MI, Mallory-Weiss, or esophageal rupture less likely.  -troponin negative   Normocytic Normochromic Anemia: hgb 10.2 >> 7.9 this admission. No gross blood loss noted. May be a component of dilution given concurrent drop in other heme lines, but concerned there may be another pathological process contributing. Will require further evaluation once more urgent issues resolved. MCV 90.7, HCT 25.4, Iron 71, TIBC 316, Ferritin 71, Folate 8.5.  - monitor CBC twice daily  -  transfuse if < 8.0 tomorrow 5/29 - blood smear ready for review   Hypokalemia: K 3.4. Vomiting is most likely etiology. Eating and drinking now - monitor BMP   AKI, resolved: Baseline Cr 1.3. Creatinine at admission 1.41>>>1.23 with hydration.  - continue IV fluids  - monitor BMP   Tobacco use disorder: ~ 30 pack year history. Likely contributed to AAA above. He has quit successfully in the past for 8 months.  - nicotine patch  - encourage smoking cessation   CAD/HLD/HTN: No chest pain or EKG changes. Trop negative. BP stable.  - restart home ASA, Lopressor, Crestor now that he is PO - fish oil not on formulary, will hold until outpatient  Diet: Soft foods  VTE ppx: Lovenox [place]   Code: Full   Dispo: Disposition is deferred at this time, awaiting improvement of current medical problems. Anticipated discharge in approximately 1-2 day(s). Will likely Follow-up for vascular surgery as an outpatient.    LOS: 5 days   Barbie Haggis

## 2013-06-10 NOTE — Progress Notes (Signed)
     Pending stress test possible today, patient was too nauseous yesterday to go through the test.  Ulyses Amor PA-C

## 2013-06-11 DIAGNOSIS — IMO0002 Reserved for concepts with insufficient information to code with codable children: Secondary | ICD-10-CM

## 2013-06-11 LAB — CBC
HEMATOCRIT: 26.1 % — AB (ref 39.0–52.0)
HEMOGLOBIN: 8.6 g/dL — AB (ref 13.0–17.0)
MCH: 29.3 pg (ref 26.0–34.0)
MCHC: 33 g/dL (ref 30.0–36.0)
MCV: 88.8 fL (ref 78.0–100.0)
Platelets: 121 10*3/uL — ABNORMAL LOW (ref 150–400)
RBC: 2.94 MIL/uL — AB (ref 4.22–5.81)
RDW: 20.4 % — ABNORMAL HIGH (ref 11.5–15.5)
WBC: 5.7 10*3/uL (ref 4.0–10.5)

## 2013-06-11 LAB — BASIC METABOLIC PANEL
BUN: 12 mg/dL (ref 6–23)
CO2: 22 meq/L (ref 19–32)
Calcium: 8.1 mg/dL — ABNORMAL LOW (ref 8.4–10.5)
Chloride: 102 mEq/L (ref 96–112)
Creatinine, Ser: 1.21 mg/dL (ref 0.50–1.35)
GFR calc Af Amer: 70 mL/min — ABNORMAL LOW (ref >90)
GFR, EST NON AFRICAN AMERICAN: 61 mL/min — AB (ref >90)
GLUCOSE: 110 mg/dL — AB (ref 70–99)
Potassium: 3.6 mEq/L — ABNORMAL LOW (ref 3.7–5.3)
Sodium: 138 mEq/L (ref 137–147)

## 2013-06-11 MED ORDER — DEXTROSE 5 % IV SOLN
1.0000 g | INTRAVENOUS | Status: DC
  Administered 2013-06-11: 1 g via INTRAVENOUS
  Filled 2013-06-11 (×2): qty 10

## 2013-06-11 MED ORDER — NON FORMULARY: 10.0000 mg | Freq: Every morning | Status: DC

## 2013-06-11 MED ORDER — POTASSIUM CHLORIDE CRYS ER 20 MEQ PO TBCR
40.0000 meq | EXTENDED_RELEASE_TABLET | Freq: Once | ORAL | Status: AC
  Administered 2013-06-11: 40 meq via ORAL
  Filled 2013-06-11: qty 2

## 2013-06-11 MED ORDER — POLYETHYLENE GLYCOL 3350 17 G PO PACK
17.0000 g | PACK | Freq: Every day | ORAL | Status: DC
  Administered 2013-06-11 – 2013-06-12 (×2): 17 g via ORAL
  Filled 2013-06-11 (×3): qty 1

## 2013-06-11 MED ORDER — ROSUVASTATIN CALCIUM 10 MG PO TABS
10.0000 mg | ORAL_TABLET | Freq: Every day | ORAL | Status: DC
  Administered 2013-06-11: 10 mg via ORAL
  Filled 2013-06-11 (×2): qty 1

## 2013-06-11 MED ORDER — SODIUM CHLORIDE 0.9 % IJ SOLN: 10.0000 mL | INTRAMUSCULAR | Status: DC | PRN

## 2013-06-11 MED ORDER — PANTOPRAZOLE SODIUM 40 MG PO TBEC
40.0000 mg | DELAYED_RELEASE_TABLET | Freq: Two times a day (BID) | ORAL | Status: DC
  Administered 2013-06-11 – 2013-06-12 (×2): 40 mg via ORAL
  Filled 2013-06-11 (×2): qty 1

## 2013-06-11 NOTE — Progress Notes (Signed)
Peripherally Inserted Central Catheter/Midline Placement  The IV Nurse has discussed with the patient and/or persons authorized to consent for the patient, the purpose of this procedure and the potential benefits and risks involved with this procedure.  The benefits include less needle sticks, lab draws from the catheter and patient may be discharged home with the catheter.  Risks include, but not limited to, infection, bleeding, blood clot (thrombus formation), and puncture of an artery; nerve damage and irregular heat beat.  Alternatives to this procedure were also discussed.  PICC/Midline Placement Documentation        [name] 06/11/2013, 12:55 PM

## 2013-06-11 NOTE — Progress Notes (Addendum)
Subjective: Tmax yesterday 102.51F.  Afebrile today.  Seen and examined this AM.  He continues to improve, is tolerating soft diet, no N/V.     Objective: Vital signs in last 24 hours: Filed Vitals:   06/10/13 1839 06/10/13 2105 06/11/13 0500 06/11/13 0614  BP:  108/51  117/62  Pulse:  81  75  Temp: 101.4 F (38.6 C) 100.4 F (38 C)  99.8 F (37.7 C)  TempSrc:  Oral  Oral  Resp:  16  16  Height:      Weight:   97.569 kg (215 lb 1.6 oz)   SpO2:  95%  97%   Weight change:   Intake/Output Summary (Last 24 hours) at 06/11/13 1221 Last data filed at 06/11/13 0507  Gross per 24 hour  Intake   1130 ml  Output   1200 ml  Net    -70 ml   General: laying in bed in NAD HEENT: no gross abnormality Cardiac: RRR, no rubs, gallops, + 2/6 systolic murmur Pulm: clear to auscultation bilaterally, moving normal volumes of air Abd: soft, nondistended, +BS, nontender Ext: warm and well perfused, no pedal edema, SCDs in place Skin:  Left antecubital fossa redness is improving Neuro: alert and oriented X3, responding appropriately, able to move all four extremities independently   Lab Results: Basic Metabolic Panel:  Recent Labs Lab 06/10/13 0630 06/11/13 0634  NA 137 138  K 3.3* 3.6*  CL 102 102  CO2 22 22  GLUCOSE 98 110*  BUN 15 12  CREATININE 1.26 1.21  CALCIUM 7.8* 8.1*   CBC:  Recent Labs Lab 06/05/13 0415  06/07/13 0519  06/10/13 0630 06/11/13 0634  WBC 7.6  < > 5.6  < > 5.4 5.7  NEUTROABS 5.3  --  4.0  --   --   --   HGB 10.2*  < > 8.1*  < > 7.9* 8.6*  HCT 30.6*  < > 24.6*  < > 23.8* 26.1*  MCV 90.0  < > 90.1  < > 89.5 88.8  PLT 129*  < > 102*  < > 121* 121*  < > = values in this interval not displayed.  Studies/Results: Nm Myocar Multi W/spect W/wall Motion / Ef  06/10/2013   CLINICAL DATA:  Chest pain  EXAM: MYOCARDIAL IMAGING WITH SPECT (REST AND PHARMACOLOGIC-STRESS)  GATED LEFT VENTRICULAR WALL MOTION STUDY  LEFT VENTRICULAR EJECTION FRACTION   TECHNIQUE: Standard myocardial SPECT imaging was performed after resting intravenous injection of 10 mCi Tc-60m sestamibi. Subsequently, intravenous infusion of Lexiscan was performed under the supervision of the Cardiology staff. At peak effect of the drug, 30 mCi Tc-42m sestamibi was injected intravenously and standard myocardial SPECT imaging was performed. Quantitative gated imaging was also performed to evaluate left ventricular wall motion, and estimate left ventricular ejection fraction.  COMPARISON:  None.  FINDINGS: SPECT: Inferior wall attenuation artifact. No perfusion defects. No stress-induced ischemia.  Wall motion:  Normal motion.  Ejection fraction: 63%. End-diastolic volume 809 cc. End systolic volume 47 cc.  IMPRESSION: Applying for a inferior wall attenuation artifact, there are no perfusion defects and there is no evidence of stress-induced ischemia.   Electronically Signed   By: Maryclare Bean M.D.   On: 06/10/2013 16:35   Medications: I have reviewed the patient's current medications. Scheduled Meds: . aspirin  81 mg Oral Daily  . cefTRIAXone (ROCEPHIN)  IV  1 g Intravenous Q24H  . enoxaparin (LOVENOX) injection  40 mg Subcutaneous Q24H  . nicotine  14 mg Transdermal Daily  . ondansetron (ZOFRAN) IV  8 mg Intravenous 4 times per day  . pantoprazole (PROTONIX) IV  40 mg Intravenous Q12H  . polyethylene glycol  17 g Oral Daily  . rosuvastatin  10 mg Oral q1800  . sodium chloride  3 mL Intravenous Q12H   Continuous Infusions: none   PRN Meds:.acetaminophen, HYDROmorphone (DILAUDID) injection, LORazepam, promethazine  Assessment/Plan:  Peripheral catheter-related cellulitis:  Erythematous and warmth at site of prior peripheral IV.  Re-cultured after fever yesterday.  Left arm appearance has improved today.  Afebrile at present and normal WBC. - continue ceftriaxone q24h - monitor WBC and for fever - f/u final blood cultures results - NGTD  AAA: CT abdomen reveals 5.8cm AAA. Has  ~ 30 pack year smoking history. No signs of leak or rupture but > 5.5cm requires surgical repair. Vascular surgery consulted and do not feel the patient's symptoms are related to AAA. They would like CT chest,/abdomen/pelvis with contrast once AKI resolved. Likely OR on 05/26.  Appreciate cardiology and vascular surgery recommendations.  Pre-op myoview completed 05/28 - no perfusion defects or evidence of stress-induced ischemia; he can proceed with surgery from a cardiac standpoint. - Vasular Surgery will hold off on elective repair until acute issues resolve (likely end of next week) - urgent repair should he decompensate - risk stratification - encourage smoking cessation; 05/2013 LDL 38 and patient is on Crestor  - continue ASA; resume Crestor now that he is tolerating po  N/V/abdominal pain, possibly 2/2 diverticulitis, resolved: abd pain, N/V x no fever x 4 days, LLQ TTP; CT w/o contrast reveals diverticulitis at the descending/sigmoid junction without abscess.  Morphine is controlling pain.  His N/V has not responded to Reglan, Phenergan or Zofran (8mg ).  It is very important to get his N/V controlled to avoid increased abd pressure given AAA of 5.8cm.  GI consulted for assistance with managing intractable vomiting and recommendations appreciated.  Patient switched to scheduled Zofran with Phenergan and Ativan prn for breakthrough nausea, morphine switched to dilaudid.  CTA chest/abd/pelvis did not reveal an etiology for intractable vomiting, shows stable mild diverticulitis w/o abscess and 5.8cm AAA w/o evidence or rupture or complicating features.  Feeling better.  N/V improved.   - Zofran 8mg  q6h, phenergan prn and Ativan prn for nausea - Dilaudid prn for pain  - continue soft diet - monitor CBC   Chest pain, resolved:  Central chest discomfort.  No radiation, no back pain.  Stat EKG - NSR, 60bpm, no acute ischemic findings, similar to prior.  Pain was likely 2/2 retching/vomiting.  Cardiology  is following.  CE negative.  Anemia/thrombocytopenia/splenomegaly:  hgb 10.2 >> 7.9 this admission.  No gross blood loss noted.  MCV normal.  Haptoglobin 223 and LDH 213 so doubt hemolysis.  Recent dip was likely dilutional as other heme lines dropped and patient was getting IV fluids at 150cc/hr.  IV fluids off since 05/28.   - will need further evaluation after resolution of acute illness - monitor CBC  Hypokalemia:  Vomiting is most likely etiology. - KDur 40 mEq - monitor BMP, replete K prn  AKI, resolved: Baseline Cr 1.3. Creatinine at admission 1.41>>>1.23 with hydration. - monitor BMP  Tobacco use disorder: ~ 30 pack year history. Likely contributed to AAA above. He has quit successfully in the past for 8 months.  - nicotine patch  - encourage smoking cessation   CAD/HLD/HTN: No chest pain or EKG changes. Trop negative. BP stable.  -  he is on ASA; resume Crestor - hold Lopressor in the setting of normotension  Diet: soft diet VTE ppx: Lovenox [place]  Code: Full  Dispo: Disposition is deferred at this time, awaiting improvement of current medical problems.  Anticipated discharge in approximately 2-3 day(s).   The patient does have a current PCP Sherren Mocha, MD) and does not need an [facility] hospital follow-up appointment after discharge.  The patient does not know have transportation limitations that hinder transportation to clinic appointments.  .Services Needed at time of discharge: Y = Yes, Blank = No PT:   OT:   RN:   Equipment:   Other:     LOS: 6 days   Duwaine Maxin, DO 06/11/2013, 12:21 PM Attending physician note: I personally examined this patient together with resident physician Dr. Duwaine Maxin and I concur with her evaluation and management plan as summarized above. GI symptoms now near completely resolved. He has had a rapid response to antibiotics for superficial cellulitis at left antecubital IV site. No new areas of ischemia on cardiac study yesterday. He  should be clear for aneurysm surgery next week. Murriel Hopper, MD, [place]  Hematology-Oncology/Internal Medicine

## 2013-06-11 NOTE — Progress Notes (Signed)
Subjective  -   Symptoms appear to be improving.  Tolerated some PO.   Physical Exam:  abd less tender CV; RRR Legs warm   Cleared for surgery from cardiology.  Appreciate their input.    Assessment/Plan:    I spoke with the patient and wife for approximately 30 min today.  He is improving.  I have tentatively placed him on the OR schedule Friday for AAA repair.  I am OK with him being discharged to home and doing this as an outpatient.  Dr Bridgett Larsson will see him on Sunday.  Please call if there are any questions.   W  06/11/2013 9:00 PM --  Filed Vitals:   06/11/13 2037  BP: 131/66  Pulse: 70  Temp: 100 F (37.8 C)  Resp: 18    Intake/Output Summary (Last 24 hours) at 06/11/13 2100 Last data filed at 06/11/13 2038  Gross per 24 hour  Intake    480 ml  Output   1500 ml  Net  -1020 ml     Laboratory CBC    Component Value Date/Time   WBC 5.7 06/11/2013 0634   HGB 8.6* 06/11/2013 0634   HCT 26.1* 06/11/2013 0634   PLT 121* 06/11/2013 0634    BMET    Component Value Date/Time   NA 138 06/11/2013 0634   K 3.6* 06/11/2013 0634   CL 102 06/11/2013 0634   CO2 22 06/11/2013 0634   GLUCOSE 110* 06/11/2013 0634   BUN 12 06/11/2013 0634   CREATININE 1.21 06/11/2013 0634   CALCIUM 8.1* 06/11/2013 0634   GFRNONAA 61* 06/11/2013 0634   GFRAA 70* 06/11/2013 0634    COAG Lab Results  Component Value Date   INR 1.24 06/05/2013   INR 1.66* 10/20/2008   No results found for this basename: PTT    Antibiotics Anti-infectives   Start     Dose/Rate Route Frequency Ordered Stop   06/11/13 1500  cefTRIAXone (ROCEPHIN) 1 g in dextrose 5 % 50 mL IVPB     1 g 100 mL/hr over 30 Minutes Intravenous Every 24 hours 06/11/13 1027     06/10/13 2000  vancomycin (VANCOCIN) IVPB 1000 mg/200 mL premix  Status:  Discontinued     1,000 mg 200 mL/hr over 60 Minutes Intravenous Every 12 hours 06/10/13 1856 06/11/13 1026   06/10/13 2000  ceFEPIme (MAXIPIME) 2 g in dextrose 5 %  50 mL IVPB  Status:  Discontinued     2 g 100 mL/hr over 30 Minutes Intravenous Every 12 hours 06/10/13 1857 06/11/13 1026   06/10/13 1400  cefTRIAXone (ROCEPHIN) 2 g in dextrose 5 % 50 mL IVPB  Status:  Discontinued     2 g 100 mL/hr over 30 Minutes Intravenous Every 24 hours 06/10/13 1356 06/10/13 1820   06/09/13 2000  ceFEPIme (MAXIPIME) 2 g in dextrose 5 % 50 mL IVPB  Status:  Discontinued     2 g 100 mL/hr over 30 Minutes Intravenous Every 12 hours 06/09/13 1907 06/10/13 1356   06/09/13 2000  vancomycin (VANCOCIN) IVPB 1000 mg/200 mL premix  Status:  Discontinued     1,000 mg 200 mL/hr over 60 Minutes Intravenous Every 12 hours 06/09/13 1911 06/10/13 1356   06/06/13 1800  metroNIDAZOLE (FLAGYL) IVPB 500 mg  Status:  Discontinued     50 0 mg 100 mL/hr over 60 Minutes Intravenous Every 8 hours 06/06/13 1708 06/08/13 1146   06/06/13 0815  ciprofloxacin (CIPRO) IVPB 400 mg  Status:  Discontinued  400 mg 200 mL/hr over 60 Minutes Intravenous Every 12 hours 06/06/13 0806 06/08/13 1146   06/06/13 0240  ciprofloxacin (CIPRO) IVPB 400 mg     400 mg 200 mL/hr over 60 Minutes Intravenous  Once 06/05/13 1839 06/06/13 0257   06/05/13 1315  metroNIDAZOLE (FLAGYL) IVPB 500 mg     500 mg 100 mL/hr over 60 Minutes Intravenous  Once 06/05/13 1307 06/05/13 1722   06/05/13 1315  ciprofloxacin (CIPRO) IVPB 400 mg     400 mg 200 mL/hr over 60 Minutes Intravenous  Once 06/05/13 1307 06/05/13 1549       V. Leia Alf, M.D. [facility] Office: [phone] Pager:  [phone]

## 2013-06-11 NOTE — Progress Notes (Signed)
Patient Name: [name] Date of Encounter: 06/11/2013     Principal Problem:   Diverticulitis Active Problems:   AAA (abdominal aortic aneurysm) without rupture   Nausea with vomiting   Cellulitis    SUBJECTIVE  Feels well. Left arm less tender. No chest pain. Rhythm NSR  CURRENT MEDS . aspirin  81 mg Oral Daily  . ceFEPime (MAXIPIME) IV  2 g Intravenous Q12H  . enoxaparin (LOVENOX) injection  40 mg Subcutaneous Q24H  . nicotine  14 mg Transdermal Daily  . ondansetron (ZOFRAN) IV  8 mg Intravenous 4 times per day  . pantoprazole (PROTONIX) IV  40 mg Intravenous Q12H  . sodium chloride  3 mL Intravenous Q12H  . vancomycin  1,000 mg Intravenous Q12H    OBJECTIVE  Filed Vitals:   06/10/13 1839 06/10/13 2105 06/11/13 0500 06/11/13 0614  BP:  108/51  117/62  Pulse:  81  75  Temp: 101.4 F (38.6 C) 100.4 F (38 C)  99.8 F (37.7 C)  TempSrc:  Oral  Oral  Resp:  16  16  Height:      Weight:   215 lb 1.6 oz (97.569 kg)   SpO2:  95%  97%    Intake/Output Summary (Last 24 hours) at 06/11/13 0858 Last data filed at 06/11/13 0507  Gross per 24 hour  Intake   1130 ml  Output   1200 ml  Net    -70 ml   Filed Weights   06/05/13 1842 06/10/13 1715 06/11/13 0500  Weight: 199 lb (90.266 kg) 207 lb (93.895 kg) 215 lb 1.6 oz (97.569 kg)    PHYSICAL EXAM  General: Pleasant, NAD. Neuro: Alert and oriented X 3. Moves all extremities spontaneously. Psych: Normal affect. HEENT:  Normal  Neck: Supple without bruits or JVD. Lungs:  Resp regular and unlabored, CTA. Heart: RRR no s3, s4,. Soft systolic ejection murmur at base. Abdomen: Soft, non-tender, non-distended, BS + x 4.  Extremities: Cellulitis left antecubital area.  Accessory Clinical Findings  CBC  Recent Labs  06/10/13 0630 06/11/13 0634  WBC 5.4 5.7  HGB 7.9* 8.6*  HCT 23.8* 26.1*  MCV 89.5 88.8  PLT 121* [id number]*   Basic Metabolic Panel  Recent Labs  06/10/13 0630 06/11/13 0634  NA 137 138   K 3.3* 3.6*  CL 102 102  CO2 22 22  GLUCOSE 98 110*  BUN 15 12  CREATININE 1.26 1.21  CALCIUM 7.8* 8.1*   Liver Function Tests No results found for this basename: AST, ALT, ALKPHOS, BILITOT, PROT, ALBUMIN,  in the last 72 hours No results found for this basename: LIPASE, AMYLASE,  in the last 72 hours Cardiac Enzymes  Recent Labs  06/08/13 1145  TROPONINI <0.30   BNP No components found with this basename: POCBNP,  D-Dimer No results found for this basename: DDIMER,  in the last 72 hours Hemoglobin A1C No results found for this basename: HGBA1C,  in the last 72 hours Fasting Lipid Panel No results found for this basename: CHOL, HDL, LDLCALC, TRIG, CHOLHDL, LDLDIRECT,  in the last 72 hours Thyroid Function Tests No results found for this basename: TSH, T4TOTAL, FREET3, T3FREE, THYROIDAB,  in the last 72 hours  TELE  NSR  ECG    Radiology/Studies  Ct Abdomen Pelvis Wo Contrast  06/05/2013   CLINICAL DATA:  Abdominal pain and cramping.  Hematuria.  EXAM: CT ABDOMEN AND PELVIS WITHOUT CONTRAST  TECHNIQUE: Multidetector CT imaging of the abdomen and pelvis was  performed following the standard protocol without IV contrast.  COMPARISON:  None.  FINDINGS: Minimal dependent atelectasis in the visualized lung bases. Coronary calcification. Unremarkable liver, spleen, right adrenal gland, pancreas. Unenhanced CT was performed per clinician order. Lack of IV contrast limits sensitivity and specificity, especially for evaluation of abdominal/pelvic solid viscera.  At least 2 subcentimeter partially calcified stones layer in the dependent aspect of the nondilated gallbladder. 15 mm low-attenuation left adrenal nodule, probably benign adenomas in the absence of history of primary carcinoma. . Small probable cysts in the upper pole left kidney and from the lower pole right kidney. No hydronephrosis.  Fusiform distal aortic aneurysm measuring up to 5.8 cm maximum transverse diameter,  tapering to a diameter of 3.6 cm at the bifurcation. Iliac arteries are atheromatous but non aneurysmal. There is no evidence of retroperitoneal hemorrhage nor hyperdense crescent to suggest impending rupture.  Stomach, small bowel, colon are nondilated. Multiple descending and sigmoid diverticula, with some mild inflammatory/edematous changes around the descending/sigmoid junction. No abscess. No free air. No ascites. Urinary bladder physiologically distended. Moderate prostatic enlargement with central coarse calcifications. No adenopathy.  IMPRESSION: 1. Diverticulitis at the descending/sigmoid junction without abscess. 2. 5.8 cm abdominal aortic aneurysm. Vascular surgery consultation recommended due to increased risk of rupture for AAA >5.5 cm. This recommendation follows ACR consensus. This recommendation follows ACR consensus guidelines: White Paper of the ACR Incidental Findings Committee II on Vascular Findings. J Am Coll Radiol 2013; 10:789-794.   Electronically Signed   By: Arne Cleveland M.D.   On: 06/05/2013 12:52   Dg Chest 2 View  06/05/2013   CLINICAL DATA:  Vomiting and facial swelling.  EXAM: CHEST  2 VIEW  COMPARISON:  Chest radiograph performed 10/20/2008  FINDINGS: The lungs are well-aerated and clear. There is no evidence of focal opacification, pleural effusion or pneumothorax.  The heart is normal in size; the mediastinal contour is within normal limits. No acute osseous abnormalities are seen.  IMPRESSION: No acute cardiopulmonary process seen.   Electronically Signed   By: Garald Balding M.D.   On: 06/05/2013 04:47   Nm Myocar Multi W/spect W/wall Motion / Ef  06/10/2013   CLINICAL DATA:  Chest pain  EXAM: MYOCARDIAL IMAGING WITH SPECT (REST AND PHARMACOLOGIC-STRESS)  GATED LEFT VENTRICULAR WALL MOTION STUDY  LEFT VENTRICULAR EJECTION FRACTION  TECHNIQUE: Standard myocardial SPECT imaging was performed after resting intravenous injection of 10 mCi Tc-3m sestamibi. Subsequently,  intravenous infusion of Lexiscan was performed under the supervision of the Cardiology staff. At peak effect of the drug, 30 mCi Tc-65m sestamibi was injected intravenously and standard myocardial SPECT imaging was performed. Quantitative gated imaging was also performed to evaluate left ventricular wall motion, and estimate left ventricular ejection fraction.  COMPARISON:  None.  FINDINGS: SPECT: Inferior wall attenuation artifact. No perfusion defects. No stress-induced ischemia.  Wall motion:  Normal motion.  Ejection fraction: 63%. End-diastolic volume 299 cc. End systolic volume 47 cc.  IMPRESSION: Applying for a inferior wall attenuation artifact, there are no perfusion defects and there is no evidence of stress-induced ischemia.   Electronically Signed   By: Maryclare Bean M.D.   On: 06/10/2013 16:35   Ct Angio Chest Aortic Dissect W &/or W/o  06/07/2013   CLINICAL DATA:  Aortic aneurysm  EXAM: CT ANGIOGRAPHY CHEST, ABDOMEN AND PELVIS  TECHNIQUE: Multidetector CT imaging through the chest, abdomen and pelvis was performed using the standard protocol during bolus administration of intravenous contrast. Multiplanar reconstructed images and MIPs were obtained  and reviewed to evaluate the vascular anatomy.  CONTRAST:  151mL OMNIPAQUE IOHEXOL 350 MG/ML SOLN  COMPARISON:  06/05/2013  FINDINGS: CTA CHEST FINDINGS  No precontrast images were done. Patchy coronary, aortic arch, and descending thoracic aortic calcifications. Adequate contrast opacification of the thoracic aorta with no evidence of dissection, aneurysm, or stenosis. There is classic 3-vessel brachiocephalic arch anatomy without proximal stenosis. Good contrast opacification of pulmonary artery branches; the exam was not optimized for detection of pulmonary emboli. No pleural or pericardial effusion. Borderline enlarged AP window, prevascular, right hilar, and precarinal lymph nodes measuring up to the 11 mm short axis diameter. Subpleural blebs in both  lung apices. Minimal dependent atelectasis posteriorly in both lower lobes with some patchy airspace consolidation or subsegmental atelectasis posterolaterally at the right lung base. Thoracic spine and sternum intact.  Review of the MIP images confirms the above findings.  CTA ABDOMEN AND PELVIS FINDINGS  Arterial findings:  Aorta: Mild scattered calcified plaque in the suprarenal segment. Fusiform infrarenal aneurysm, 5.8 x 5.6 cm maximum transverse dimensions, tapering to a diameter of 3.4 cm at the bifurcation. Short infrarenal neck less than 3 cm. There is a large amount of intraluminal mural thrombus in the aneurysmal segment. No dissection or stenosis.  Celiac axis:         Patent.  Superior mesenteric: Patent, with replaced right hepatic arterial supply, an anatomic variant.  Left renal:          Single, patent.  Right renal:         Single, patent.  Inferior mesenteric: Short segment origin occlusion, reconstituted distally by visceral collaterals.  Left iliac: Eccentric calcified plaque through the common iliac artery. There is a short segment of at least 50% diameter stenosis at the origin of the common iliac artery. There is a web-like stenosis at the origin of the external iliac artery resulting greater than 50% diameter narrowing, with some mild plaque in the more distal external iliac artery but no tandem stenotic lesion. Eccentric plaque in the internal iliac artery which is patent. No aneurysm.  Right iliac: Calcified plaque throughout the common iliac artery without high-grade stenosis or aneurysm. There is mild eccentric plaque in the proximal internal iliac artery. External iliac widely patent. No aneurysm or dissection.  Venous findings: Dedicated venous phase imaging not obtained. Patent portal vein, splenic vein, bilateral renal veins.  Review of the MIP images confirms the above findings.  Nonvascular findings: Unremarkable arterial phase evaluation of liver. Spleen mildly enlarged, 16.2 cm  maximum length. 19 mm left adrenal nodule. Pancreas and right adrenal gland unremarkable. Several subcentimeter partially calcified stones layer in the dependent aspect of the gallbladder without wall thickening or enhancement. Small bilateral renal cysts. No hydronephrosis. Stomach, small bowel, and colon are nondilated. Innumerable distal descending and sigmoid diverticula. Persistent mild inflammatory change around the proximal sigmoid colon. No abscess. No free air. Urinary bladder is physiologically distended. Moderate prostatic enlargement with central coarse calcifications. No ascites. No adenopathy. Advanced degenerative disc disease L5-S1 with posterior protrusion.  IMPRESSION: 1. 5.8 cm fusiform infrarenal abdominal aortic aneurysm without evidence of rupture or other complicating features. Vascular surgery consultation recommended due to increased risk of rupture for AAA >5.5 cm. This recommendation follows ACR consensus. This recommendation follows ACR consensus guidelines: White Paper of the ACR Incidental Findings Committee II on Vascular Findings. J Am Coll Radiol 2013; 10:789-794. 2. Tandem stenoses in the proximal and distal left common iliac artery. 3. Nonspecific right hilar and mediastinal adenopathy. 4.  Patchy airspace infiltrate in the posterolateral right lower lobe. 5. Splenomegaly 6. Cholelithiasis 7. Stable left adrenal nodule, probably benign adenoma in the absence of a history of primary carcinoma. 8. Stable mild sigmoid diverticulitis without abscess.   Electronically Signed   By: Arne Cleveland M.D.   On: 06/07/2013 11:27   Ct Angio Abd/pel W/ And/or W/o  06/07/2013   CLINICAL DATA:  Aortic aneurysm  EXAM: CT ANGIOGRAPHY CHEST, ABDOMEN AND PELVIS  TECHNIQUE: Multidetector CT imaging through the chest, abdomen and pelvis was performed using the standard protocol during bolus administration of intravenous contrast. Multiplanar reconstructed images and MIPs were obtained and reviewed  to evaluate the vascular anatomy.  CONTRAST:  172mL OMNIPAQUE IOHEXOL 350 MG/ML SOLN  COMPARISON:  06/05/2013  FINDINGS: CTA CHEST FINDINGS  No precontrast images were done. Patchy coronary, aortic arch, and descending thoracic aortic calcifications. Adequate contrast opacification of the thoracic aorta with no evidence of dissection, aneurysm, or stenosis. There is classic 3-vessel brachiocephalic arch anatomy without proximal stenosis. Good contrast opacification of pulmonary artery branches; the exam was not optimized for detection of pulmonary emboli. No pleural or pericardial effusion. Borderline enlarged AP window, prevascular, right hilar, and precarinal lymph nodes measuring up to the 11 mm short axis diameter. Subpleural blebs in both lung apices. Minimal dependent atelectasis posteriorly in both lower lobes with some patchy airspace consolidation or subsegmental atelectasis posterolaterally at the right lung base. Thoracic spine and sternum intact.  Review of the MIP images confirms the above findings.  CTA ABDOMEN AND PELVIS FINDINGS  Arterial findings:  Aorta: Mild scattered calcified plaque in the suprarenal segment. Fusiform infrarenal aneurysm, 5.8 x 5.6 cm maximum transverse dimensions, tapering to a diameter of 3.4 cm at the bifurcation. Short infrarenal neck less than 3 cm. There is a large amount of intraluminal mural thrombus in the aneurysmal segment. No dissection or stenosis.  Celiac axis:         Patent.  Superior mesenteric: Patent, with replaced right hepatic arterial supply, an anatomic variant.  Left renal:          Single, patent.  Right renal:         Single, patent.  Inferior mesenteric: Short segment origin occlusion, reconstituted distally by visceral collaterals.  Left iliac: Eccentric calcified plaque through the common iliac artery. There is a short segment of at least 50% diameter stenosis at the origin of the common iliac artery. There is a web-like stenosis at the origin of the  external iliac artery resulting greater than 50% diameter narrowing, with some mild plaque in the more distal external iliac artery but no tandem stenotic lesion. Eccentric plaque in the internal iliac artery which is patent. No aneurysm.  Right iliac: Calcified plaque throughout the common iliac artery without high-grade stenosis or aneurysm. There is mild eccentric plaque in the proximal internal iliac artery. External iliac widely patent. No aneurysm or dissection.  Venous findings: Dedicated venous phase imaging not obtained. Patent portal vein, splenic vein, bilateral renal veins.  Review of the MIP images confirms the above findings.  Nonvascular findings: Unremarkable arterial phase evaluation of liver. Spleen mildly enlarged, 16.2 cm maximum length. 19 mm left adrenal nodule. Pancreas and right adrenal gland unremarkable. Several subcentimeter partially calcified stones layer in the dependent aspect of the gallbladder without wall thickening or enhancement. Small bilateral renal cysts. No hydronephrosis. Stomach, small bowel, and colon are nondilated. Innumerable distal descending and sigmoid diverticula. Persistent mild inflammatory change around the proximal sigmoid colon. No abscess.  No free air. Urinary bladder is physiologically distended. Moderate prostatic enlargement with central coarse calcifications. No ascites. No adenopathy. Advanced degenerative disc disease L5-S1 with posterior protrusion.  IMPRESSION: 1. 5.8 cm fusiform infrarenal abdominal aortic aneurysm without evidence of rupture or other complicating features. Vascular surgery consultation recommended due to increased risk of rupture for AAA >5.5 cm. This recommendation follows ACR consensus. This recommendation follows ACR consensus guidelines: White Paper of the ACR Incidental Findings Committee II on Vascular Findings. J Am Coll Radiol 2013; 10:789-794. 2. Tandem stenoses in the proximal and distal left common iliac artery. 3.  Nonspecific right hilar and mediastinal adenopathy. 4. Patchy airspace infiltrate in the posterolateral right lower lobe. 5. Splenomegaly 6. Cholelithiasis 7. Stable left adrenal nodule, probably benign adenoma in the absence of a history of primary carcinoma. 8. Stable mild sigmoid diverticulitis without abscess.   Electronically Signed   By: Arne Cleveland M.D.   On: 06/07/2013 11:27    ASSESSMENT AND PLAN 1. CAD with hx old inferior MI. No recent chest pain. Myoview on 06/10/13 shows no ischemia and his EF is 63% 2. AAA - new dx 5.8 cm  3. Acute diverticulitis with intractable nausea and vomiting, resolved.  4. Tobacco abuse  5. Anemia  6. Cellulitis left antecubital space.  Recommendation:  The patient is cleared from cardiology standpoint for AAA surgery.  Will restart home dose of statin now that he is taking po better. Cardiology will sign off now.  Signed, Darlin Coco MD

## 2013-06-12 LAB — BASIC METABOLIC PANEL
BUN: 15 mg/dL (ref 6–23)
CALCIUM: 8.2 mg/dL — AB (ref 8.4–10.5)
CHLORIDE: 103 meq/L (ref 96–112)
CO2: 25 mEq/L (ref 19–32)
CREATININE: 1.14 mg/dL (ref 0.50–1.35)
GFR calc Af Amer: 76 mL/min — ABNORMAL LOW (ref >90)
GFR calc non Af Amer: 65 mL/min — ABNORMAL LOW (ref >90)
Glucose, Bld: 111 mg/dL — ABNORMAL HIGH (ref 70–99)
Potassium: 3.7 mEq/L (ref 3.7–5.3)
Sodium: 138 mEq/L (ref 137–147)

## 2013-06-12 LAB — CBC
HEMATOCRIT: 25.8 % — AB (ref 39.0–52.0)
Hemoglobin: 8.4 g/dL — ABNORMAL LOW (ref 13.0–17.0)
MCH: 29.3 pg (ref 26.0–34.0)
MCHC: 32.6 g/dL (ref 30.0–36.0)
MCV: 89.9 fL (ref 78.0–100.0)
PLATELETS: 120 10*3/uL — AB (ref 150–400)
RBC: 2.87 MIL/uL — ABNORMAL LOW (ref 4.22–5.81)
RDW: 20.2 % — AB (ref 11.5–15.5)
WBC: 5.8 10*3/uL (ref 4.0–10.5)

## 2013-06-12 MED ORDER — NICOTINE 14 MG/24HR TD PT24: 14.0000 mg | MEDICATED_PATCH | Freq: Every day | TRANSDERMAL | Status: DC

## 2013-06-12 MED ORDER — CEPHALEXIN 500 MG PO CAPS
500.0000 mg | ORAL_CAPSULE | Freq: Four times a day (QID) | ORAL | Status: DC
  Administered 2013-06-12: 500 mg via ORAL
  Filled 2013-06-12 (×4): qty 1

## 2013-06-12 MED ORDER — ONDANSETRON HCL 4 MG PO TABS: 4.0000 mg | ORAL_TABLET | Freq: Three times a day (TID) | ORAL | Status: DC | PRN

## 2013-06-12 MED ORDER — CEPHALEXIN 500 MG PO CAPS: 500.0000 mg | ORAL_CAPSULE | Freq: Four times a day (QID) | ORAL | Status: DC

## 2013-06-12 NOTE — Progress Notes (Addendum)
Subjective: NAEON. Pt had no emesis overnight and tolerated full diet. Pt arm has no pain and is ready to go home in anticipation of surgery next week for AAA repair.    Objective: Vital signs in last 24 hours: Filed Vitals:   06/11/13 1419 06/11/13 2037 06/12/13 0219 06/12/13 0559  BP: 119/76 131/66  112/69  Pulse: 76 70  76  Temp: 99.9 F (37.7 C) 100 F (37.8 C) 99.7 F (37.6 C) 99.8 F (37.7 C)  TempSrc: Oral Oral Oral Oral  Resp: 16 18  18   Height:      Weight:      SpO2: 97% 98%  98%   Weight change:   Intake/Output Summary (Last 24 hours) at 06/12/13 1157 Last data filed at 06/12/13 0601  Gross per 24 hour  Intake    480 ml  Output    700 ml  Net   -220 ml   General: laying in bed in NAD HEENT: no gross abnormality Cardiac: RRR, no rubs, gallops, + 2/6 systolic murmur Pulm: clear to auscultation bilaterally, no crackles, or wheezes, moving normal volumes of air Abd: soft, nondistended, +BS, nontender Ext: warm and well perfused, no pedal edema, SCDs in place Skin:  Left antecubital fossa redness totally resolved, nttp, PICC in place right upper arm nttp and no erythema Neuro: alert and oriented X3, responding appropriately, able to move all four extremities independently  Lab Results: Basic Metabolic Panel:  Recent Labs Lab 06/11/13 0634 06/12/13 0454  NA 138 138  K 3.6* 3.7  CL 102 103  CO2 22 25  GLUCOSE 110* 111*  BUN 12 15  CREATININE 1.21 1.14  CALCIUM 8.1* 8.2*   CBC:  Recent Labs Lab 06/07/13 0519  06/11/13 0634 06/12/13 0454  WBC 5.6  < > 5.7 5.8  NEUTROABS 4.0  --   --   --   HGB 8.1*  < > 8.6* 8.4*  HCT 24.6*  < > 26.1* 25.8*  MCV 90.1  < > 88.8 89.9  PLT 102*  < > 121* 120*  < > = values in this interval not displayed.  Studies/Results: Nm Myocar Multi W/spect W/wall Motion / Ef  06/10/2013   CLINICAL DATA:  Chest pain  EXAM: MYOCARDIAL IMAGING WITH SPECT (REST AND PHARMACOLOGIC-STRESS)  GATED LEFT VENTRICULAR WALL MOTION STUDY   LEFT VENTRICULAR EJECTION FRACTION  TECHNIQUE: Standard myocardial SPECT imaging was performed after resting intravenous injection of 10 mCi Tc-80m sestamibi. Subsequently, intravenous infusion of Lexiscan was performed under the supervision of the Cardiology staff. At peak effect of the drug, 30 mCi Tc-52m sestamibi was injected intravenously and standard myocardial SPECT imaging was performed. Quantitative gated imaging was also performed to evaluate left ventricular wall motion, and estimate left ventricular ejection fraction.  COMPARISON:  None.  FINDINGS: SPECT: Inferior wall attenuation artifact. No perfusion defects. No stress-induced ischemia.  Wall motion:  Normal motion.  Ejection fraction: 63%. End-diastolic volume 175 cc. End systolic volume 47 cc.  IMPRESSION: Applying for a inferior wall attenuation artifact, there are no perfusion defects and there is no evidence of stress-induced ischemia.   Electronically Signed   By: Maryclare Bean M.D.   On: 06/10/2013 16:35   Medications: I have reviewed the patient's current medications. Scheduled Meds: . aspirin  81 mg Oral Daily  . cefTRIAXone (ROCEPHIN)  IV  1 g Intravenous Q24H  . enoxaparin (LOVENOX) injection  40 mg Subcutaneous Q24H  . nicotine  14 mg Transdermal Daily  . ondansetron (ZOFRAN)  IV  8 mg Intravenous 4 times per day  . pantoprazole  40 mg Oral BID  . polyethylene glycol  17 g Oral Daily  . rosuvastatin  10 mg Oral q1800  . sodium chloride  3 mL Intravenous Q12H   Continuous Infusions: none   PRN Meds:.acetaminophen, HYDROmorphone (DILAUDID) injection, LORazepam, promethazine, sodium chloride  Assessment/Plan:  Peripheral catheter-related cellulitis:  Erythematous and warmth at site of prior peripheral IV.  Re-cultured after fever yesterday.  Left arm appearance has improved today.  Afebrile at present and normal WBC. - continue ceftriaxone q24h>> oral keflex 500 TID to complete 7 day - f/u final blood cultures results -  NGTD  AAA: CT abdomen reveals 5.8cm AAA. Has ~ 30 pack year smoking history. No signs of leak or rupture but > 5.5cm requires surgical repair. Vascular surgery consulted and do not feel the patient's symptoms are related to AAA. They would like CT chest,/abdomen/pelvis with contrast once AKI resolved. Appreciate cardiology and vascular surgery recommendations.  Pre-op myoview completed 05/28 - no perfusion defects or evidence of stress-induced ischemia; he can proceed with surgery from a cardiac standpoint.- Vasular Surgery will hold off on elective repair until acute issues resolve (likely end of next week) - urgent repair should he decompensate  N/V/abdominal pain, possibly 2/2 diverticulitis, resolved: abd pain, N/V x no fever x 4 days, LLQ TTP; CT w/o contrast reveals diverticulitis at the descending/sigmoid junction without abscess.  Morphine is controlling pain.  His N/V has not responded to Reglan, Phenergan or Zofran (8mg ).  It is very important to get his N/V controlled to avoid increased abd pressure given AAA of 5.8cm.  GI consulted for assistance with managing intractable vomiting and recommendations appreciated.  Patient switched to scheduled Zofran with Phenergan and Ativan prn for breakthrough nausea, morphine switched to dilaudid.  CTA chest/abd/pelvis did not reveal an etiology for intractable vomiting, shows stable mild diverticulitis w/o abscess and 5.8cm AAA w/o evidence or rupture or complicating features.  Feeling better.  N/V improved.   - Zofran 8mg  q6h, phenergan prn and Ativan prn for nausea - Dilaudid prn for pain  - continue soft diet - monitor CBC   Anemia/thrombocytopenia/splenomegaly:  hgb 10.2 >> 7.9 this admission.  No gross blood loss noted.  MCV normal.  Haptoglobin 223 and LDH 213 so doubt hemolysis.  Recent dip was likely dilutional as other heme lines dropped and patient was getting IV fluids at 150cc/hr.  IV fluids off since 05/28.   - will need further evaluation  after resolution of acute illness -there is concern for lymphoma given continued normocytic anemia and splenomegaly on abd CT. There is need to obtain lymph bx during AAA repair this message will be made on d/c summary and sent to Dr. Trula Slade and PCP - monitor CBC  Tobacco use disorder: ~ 30 pack year history. Likely contributed to AAA above. He has quit successfully in the past for 8 months.  - nicotine patch  - encourage smoking cessation   CAD/HLD/HTN: No chest pain or EKG changes. Trop negative. BP stable.  - he is on ASA; resume Crestor - hold Lopressor in the setting of normotension  Diet: soft diet VTE ppx: Lovenox [place]  Code: Full  Dispo: Disposition is deferred at this time, awaiting improvement of current medical problems.  Anticipated discharge in approximately 2-3 day(s).   The patient does have a current PCP Sherren Mocha, MD) and does not need an [facility] hospital follow-up appointment after discharge.  The patient does  not know have transportation limitations that hinder transportation to clinic appointments.  .Services Needed at time of discharge: Y = Yes, Blank = No PT:   OT:   RN:   Equipment:   Other:     LOS: 7 days   Clinton Gallant, MD 06/12/2013, 11:57 AM Attending physician note: I personally interviewed and examined this patient together with resident physician Dr. Clinton Gallant and I agree with her evaluation and management plan above. The patient's GI symptoms have resolved. Left antecubital cellulitis also resolved probably with IV antibiotics. He is stable for discharge to return later next week for aortic aneurysm repair. I did review with him an additional problem that we did not bring to his attention until now. He has normochromic anemia and borderline splenomegaly with associated mild thrombocytopenia which will need further evaluation after he recovers from his aneurysm surgery. At time of exploration for his aneurysm, lymph node sampling would be very useful  in determining the etiology of his splenomegaly. Splenomegaly.  Murriel Hopper, MD, [place]  Hematology-Oncology/Internal Medicine

## 2013-06-12 NOTE — Discharge Summary (Signed)
Name: [name] MRN: [id number] DOB: 05-09-1945 68 y.o. PCP: Sherren Mocha, MD  Date of Admission: 06/05/2013 11:51 AM Date of Discharge: 06/12/2013 Attending Physician: Annia Belt, MD  Discharge Diagnosis: 1. Irretractable nausea and vomiting likely 2/2 Gastroenteritis 2. AAA (5.8 cm) found on CT 3. Left arm cellulitis  Discharge Medications:   Medication List    STOP taking these medications       oxymetazoline 0.05 % nasal spray  Commonly known as:  AFRIN     predniSONE 20 MG tablet  Commonly known as:  DELTASONE     promethazine 25 MG tablet  Commonly known as:  PHENERGAN      TAKE these medications       aspirin 81 MG tablet  Take 81 mg by mouth daily.     cephALEXin 500 MG capsule  Commonly known as:  KEFLEX  Take 1 capsule (500 mg total) by mouth every 6 (six) hours.     fish oil-omega-3 fatty acids 1000 MG capsule  Take 2 g by mouth daily.     metoprolol tartrate 25 MG tablet  Commonly known as:  LOPRESSOR  Take 0.5 tablets (12.5 mg total) by mouth 2 (two) times daily.     nicotine 14 mg/24hr patch  Commonly known as:  NICODERM CQ - dosed in mg/24 hours  Place 1 patch (14 mg total) onto the skin daily.     nitroGLYCERIN 0.4 MG SL tablet  Commonly known as:  NITROSTAT  Place 1 tablet (0.4 mg total) under the tongue every 5 (five) minutes as needed.     ondansetron 4 MG disintegrating tablet  Commonly known as:  ZOFRAN ODT  Take 1 tablet (4 mg total) by mouth every 8 (eight) hours as needed for nausea.     ondansetron 4 MG tablet  Commonly known as:  ZOFRAN  Take 1 tablet (4 mg total) by mouth every 8 (eight) hours as needed for nausea or vomiting.     rosuvastatin 10 MG tablet  Commonly known as:  CRESTOR  Take 10 mg by mouth daily.        Disposition and follow-up:   DarrellAmato E [name] was discharged from [facility] in Stable condition.  At the hospital follow up visit please address:  1.  Pt will hold ASA  in anticipation of AAA repair on 06/18/13, resolution of cellulitis  2.  Labs / imaging needed at time of follow-up: at the time of exploring aneurysm lymph node sampling to help determine normocytic anemia and splenomegaly  3.  Pending labs/ test needing follow-up: final BCx results   Follow-up Appointments:   Discharge Instructions: Discharge Instructions   Diet - low sodium heart healthy    Complete by:  As directed      Discharge instructions    Complete by:  As directed   Please complete your antibiotics as prescribed for your arm infection. If you develop serious side effects such as rash, worsening fever, chills, or inability to keep food down please be seen by a provider.     Increase activity slowly    Complete by:  As directed            Consultations: Treatment Team:  Serafina Mitchell, MD Cardiology   Procedures Performed:  Ct Abdomen Pelvis Wo Contrast  06/05/2013   CLINICAL DATA:  Abdominal pain and cramping.  Hematuria.  EXAM: CT ABDOMEN AND PELVIS WITHOUT CONTRAST  TECHNIQUE: Multidetector CT imaging of the  abdomen and pelvis was performed following the standard protocol without IV contrast.  COMPARISON:  None.  FINDINGS: Minimal dependent atelectasis in the visualized lung bases. Coronary calcification. Unremarkable liver, spleen, right adrenal gland, pancreas. Unenhanced CT was performed per clinician order. Lack of IV contrast limits sensitivity and specificity, especially for evaluation of abdominal/pelvic solid viscera.  At least 2 subcentimeter partially calcified stones layer in the dependent aspect of the nondilated gallbladder. 15 mm low-attenuation left adrenal nodule, probably benign adenomas in the absence of history of primary carcinoma. . Small probable cysts in the upper pole left kidney and from the lower pole right kidney. No hydronephrosis.  Fusiform distal aortic aneurysm measuring up to 5.8 cm maximum transverse diameter, tapering to a diameter of 3.6 cm at  the bifurcation. Iliac arteries are atheromatous but non aneurysmal. There is no evidence of retroperitoneal hemorrhage nor hyperdense crescent to suggest impending rupture.  Stomach, small bowel, colon are nondilated. Multiple descending and sigmoid diverticula, with some mild inflammatory/edematous changes around the descending/sigmoid junction. No abscess. No free air. No ascites. Urinary bladder physiologically distended. Moderate prostatic enlargement with central coarse calcifications. No adenopathy.  IMPRESSION: 1. Diverticulitis at the descending/sigmoid junction without abscess. 2. 5.8 cm abdominal aortic aneurysm. Vascular surgery consultation recommended due to increased risk of rupture for AAA >5.5 cm. This recommendation follows ACR consensus. This recommendation follows ACR consensus guidelines: White Paper of the ACR Incidental Findings Committee II on Vascular Findings. J Am Coll Radiol 2013; 10:789-794.   Electronically Signed   By: Arne Cleveland M.D.   On: 06/05/2013 12:52   Dg Chest 2 View  06/05/2013   CLINICAL DATA:  Vomiting and facial swelling.  EXAM: CHEST  2 VIEW  COMPARISON:  Chest radiograph performed 10/20/2008  FINDINGS: The lungs are well-aerated and clear. There is no evidence of focal opacification, pleural effusion or pneumothorax.  The heart is normal in size; the mediastinal contour is within normal limits. No acute osseous abnormalities are seen.  IMPRESSION: No acute cardiopulmonary process seen.   Electronically Signed   By: Garald Balding M.D.   On: 06/05/2013 04:47   Nm Myocar Multi W/spect W/wall Motion / Ef  06/10/2013   CLINICAL DATA:  Chest pain  EXAM: MYOCARDIAL IMAGING WITH SPECT (REST AND PHARMACOLOGIC-STRESS)  GATED LEFT VENTRICULAR WALL MOTION STUDY  LEFT VENTRICULAR EJECTION FRACTION  TECHNIQUE: Standard myocardial SPECT imaging was performed after resting intravenous injection of 10 mCi Tc-68m sestamibi. Subsequently, intravenous infusion of Lexiscan was  performed under the supervision of the Cardiology staff. At peak effect of the drug, 30 mCi Tc-82m sestamibi was injected intravenously and standard myocardial SPECT imaging was performed. Quantitative gated imaging was also performed to evaluate left ventricular wall motion, and estimate left ventricular ejection fraction.  COMPARISON:  None.  FINDINGS: SPECT: Inferior wall attenuation artifact. No perfusion defects. No stress-induced ischemia.  Wall motion:  Normal motion.  Ejection fraction: 63%. End-diastolic volume 379 cc. End systolic volume 47 cc.  IMPRESSION: Applying for a inferior wall attenuation artifact, there are no perfusion defects and there is no evidence of stress-induced ischemia.   Electronically Signed   By: Maryclare Bean M.D.   On: 06/10/2013 16:35   Ct Angio Chest Aortic Dissect W &/or W/o  06/07/2013   CLINICAL DATA:  Aortic aneurysm  EXAM: CT ANGIOGRAPHY CHEST, ABDOMEN AND PELVIS  TECHNIQUE: Multidetector CT imaging through the chest, abdomen and pelvis was performed using the standard protocol during bolus administration of intravenous contrast. Multiplanar reconstructed images  and MIPs were obtained and reviewed to evaluate the vascular anatomy.  CONTRAST:  156mL OMNIPAQUE IOHEXOL 350 MG/ML SOLN  COMPARISON:  06/05/2013  FINDINGS: CTA CHEST FINDINGS  No precontrast images were done. Patchy coronary, aortic arch, and descending thoracic aortic calcifications. Adequate contrast opacification of the thoracic aorta with no evidence of dissection, aneurysm, or stenosis. There is classic 3-vessel brachiocephalic arch anatomy without proximal stenosis. Good contrast opacification of pulmonary artery branches; the exam was not optimized for detection of pulmonary emboli. No pleural or pericardial effusion. Borderline enlarged AP window, prevascular, right hilar, and precarinal lymph nodes measuring up to the 11 mm short axis diameter. Subpleural blebs in both lung apices. Minimal dependent  atelectasis posteriorly in both lower lobes with some patchy airspace consolidation or subsegmental atelectasis posterolaterally at the right lung base. Thoracic spine and sternum intact.  Review of the MIP images confirms the above findings.  CTA ABDOMEN AND PELVIS FINDINGS  Arterial findings:  Aorta: Mild scattered calcified plaque in the suprarenal segment. Fusiform infrarenal aneurysm, 5.8 x 5.6 cm maximum transverse dimensions, tapering to a diameter of 3.4 cm at the bifurcation. Short infrarenal neck less than 3 cm. There is a large amount of intraluminal mural thrombus in the aneurysmal segment. No dissection or stenosis.  Celiac axis:         Patent.  Superior mesenteric: Patent, with replaced right hepatic arterial supply, an anatomic variant.  Left renal:          Single, patent.  Right renal:         Single, patent.  Inferior mesenteric: Short segment origin occlusion, reconstituted distally by visceral collaterals.  Left iliac: Eccentric calcified plaque through the common iliac artery. There is a short segment of at least 50% diameter stenosis at the origin of the common iliac artery. There is a web-like stenosis at the origin of the external iliac artery resulting greater than 50% diameter narrowing, with some mild plaque in the more distal external iliac artery but no tandem stenotic lesion. Eccentric plaque in the internal iliac artery which is patent. No aneurysm.  Right iliac: Calcified plaque throughout the common iliac artery without high-grade stenosis or aneurysm. There is mild eccentric plaque in the proximal internal iliac artery. External iliac widely patent. No aneurysm or dissection.  Venous findings: Dedicated venous phase imaging not obtained. Patent portal vein, splenic vein, bilateral renal veins.  Review of the MIP images confirms the above findings.  Nonvascular findings: Unremarkable arterial phase evaluation of liver. Spleen mildly enlarged, 16.2 cm maximum length. 19 mm left  adrenal nodule. Pancreas and right adrenal gland unremarkable. Several subcentimeter partially calcified stones layer in the dependent aspect of the gallbladder without wall thickening or enhancement. Small bilateral renal cysts. No hydronephrosis. Stomach, small bowel, and colon are nondilated. Innumerable distal descending and sigmoid diverticula. Persistent mild inflammatory change around the proximal sigmoid colon. No abscess. No free air. Urinary bladder is physiologically distended. Moderate prostatic enlargement with central coarse calcifications. No ascites. No adenopathy. Advanced degenerative disc disease L5-S1 with posterior protrusion.  IMPRESSION: 1. 5.8 cm fusiform infrarenal abdominal aortic aneurysm without evidence of rupture or other complicating features. Vascular surgery consultation recommended due to increased risk of rupture for AAA >5.5 cm. This recommendation follows ACR consensus. This recommendation follows ACR consensus guidelines: White Paper of the ACR Incidental Findings Committee II on Vascular Findings. J Am Coll Radiol 2013; 10:789-794. 2. Tandem stenoses in the proximal and distal left common iliac artery. 3. Nonspecific right hilar  and mediastinal adenopathy. 4. Patchy airspace infiltrate in the posterolateral right lower lobe. 5. Splenomegaly 6. Cholelithiasis 7. Stable left adrenal nodule, probably benign adenoma in the absence of a history of primary carcinoma. 8. Stable mild sigmoid diverticulitis without abscess.   Electronically Signed   By: Arne Cleveland M.D.   On: 06/07/2013 11:27   Ct Angio Abd/pel W/ And/or W/o  06/07/2013   CLINICAL DATA:  Aortic aneurysm  EXAM: CT ANGIOGRAPHY CHEST, ABDOMEN AND PELVIS  TECHNIQUE: Multidetector CT imaging through the chest, abdomen and pelvis was performed using the standard protocol during bolus administration of intravenous contrast. Multiplanar reconstructed images and MIPs were obtained and reviewed to evaluate the vascular  anatomy.  CONTRAST:  132mL OMNIPAQUE IOHEXOL 350 MG/ML SOLN  COMPARISON:  06/05/2013  FINDINGS: CTA CHEST FINDINGS  No precontrast images were done. Patchy coronary, aortic arch, and descending thoracic aortic calcifications. Adequate contrast opacification of the thoracic aorta with no evidence of dissection, aneurysm, or stenosis. There is classic 3-vessel brachiocephalic arch anatomy without proximal stenosis. Good contrast opacification of pulmonary artery branches; the exam was not optimized for detection of pulmonary emboli. No pleural or pericardial effusion. Borderline enlarged AP window, prevascular, right hilar, and precarinal lymph nodes measuring up to the 11 mm short axis diameter. Subpleural blebs in both lung apices. Minimal dependent atelectasis posteriorly in both lower lobes with some patchy airspace consolidation or subsegmental atelectasis posterolaterally at the right lung base. Thoracic spine and sternum intact.  Review of the MIP images confirms the above findings.  CTA ABDOMEN AND PELVIS FINDINGS  Arterial findings:  Aorta: Mild scattered calcified plaque in the suprarenal segment. Fusiform infrarenal aneurysm, 5.8 x 5.6 cm maximum transverse dimensions, tapering to a diameter of 3.4 cm at the bifurcation. Short infrarenal neck less than 3 cm. There is a large amount of intraluminal mural thrombus in the aneurysmal segment. No dissection or stenosis.  Celiac axis:         Patent.  Superior mesenteric: Patent, with replaced right hepatic arterial supply, an anatomic variant.  Left renal:          Single, patent.  Right renal:         Single, patent.  Inferior mesenteric: Short segment origin occlusion, reconstituted distally by visceral collaterals.  Left iliac: Eccentric calcified plaque through the common iliac artery. There is a short segment of at least 50% diameter stenosis at the origin of the common iliac artery. There is a web-like stenosis at the origin of the external iliac artery  resulting greater than 50% diameter narrowing, with some mild plaque in the more distal external iliac artery but no tandem stenotic lesion. Eccentric plaque in the internal iliac artery which is patent. No aneurysm.  Right iliac: Calcified plaque throughout the common iliac artery without high-grade stenosis or aneurysm. There is mild eccentric plaque in the proximal internal iliac artery. External iliac widely patent. No aneurysm or dissection.  Venous findings: Dedicated venous phase imaging not obtained. Patent portal vein, splenic vein, bilateral renal veins.  Review of the MIP images confirms the above findings.  Nonvascular findings: Unremarkable arterial phase evaluation of liver. Spleen mildly enlarged, 16.2 cm maximum length. 19 mm left adrenal nodule. Pancreas and right adrenal gland unremarkable. Several subcentimeter partially calcified stones layer in the dependent aspect of the gallbladder without wall thickening or enhancement. Small bilateral renal cysts. No hydronephrosis. Stomach, small bowel, and colon are nondilated. Innumerable distal descending and sigmoid diverticula. Persistent mild inflammatory change around the proximal  sigmoid colon. No abscess. No free air. Urinary bladder is physiologically distended. Moderate prostatic enlargement with central coarse calcifications. No ascites. No adenopathy. Advanced degenerative disc disease L5-S1 with posterior protrusion.  IMPRESSION: 1. 5.8 cm fusiform infrarenal abdominal aortic aneurysm without evidence of rupture or other complicating features. Vascular surgery consultation recommended due to increased risk of rupture for AAA >5.5 cm. This recommendation follows ACR consensus. This recommendation follows ACR consensus guidelines: White Paper of the ACR Incidental Findings Committee II on Vascular Findings. J Am Coll Radiol 2013; 10:789-794. 2. Tandem stenoses in the proximal and distal left common iliac artery. 3. Nonspecific right hilar and  mediastinal adenopathy. 4. Patchy airspace infiltrate in the posterolateral right lower lobe. 5. Splenomegaly 6. Cholelithiasis 7. Stable left adrenal nodule, probably benign adenoma in the absence of a history of primary carcinoma. 8. Stable mild sigmoid diverticulitis without abscess.   Electronically Signed   By: Arne Cleveland M.D.   On: 06/07/2013 11:27   ABI: ------------------------------------------------------------------ Summary: Right: ABI is within normal limits. Left: ABI indicates a mild reduction in arterial flow.  Carotid doppler: - The vertebral arteries appear patent with antegrade flow. - Findings consistent with 1- 39 percent stenosis involving the right internal carotid artery and the left internal carotid artery.  Myoview:  IMPRESSION:  Applying for a inferior wall attenuation artifact, there are no  perfusion defects and there is no evidence of stress-induced  ischemia.  Admission HPI: Patient is a 67 y.o. male presents with abdominal pain, n/v. Onset of symptoms was gradual starting a few days ago with gradually worsening course since that time. The pain is located periumbilical. Patient describes the pain as sharp intermittent, occurring about every 1-2 hours and lasting 10-15 minutes, or until he vomits. Pain has been associated with n/v, malaise, constipation, weakness. Patient denies changes in vision, fever, diarrhea, cough, headache, dysuria, back pain. There are no clear aggravating or alleviating factors. Past history includes recent medication changes. He presented to the ED last night for these issues, was discharged this morning with suspected drug reaction, and was given prescriptions for prednisone, zofran and phenergan, which he did not fill. He returned to the ED this afternoon because of increasing severity of abdominal pain. Previous studies include CBC, BMET, EKG, CXR, and CT abdomen, which revealed active diverticulitis and 5.8cm AAA.   Hospital  Course by problem list: Intractable nausea and vomiting likely 2/2 GE: Pt initial presentation of abd pain, N/V x no fever x 4 days, LLQ TTP and CT w/o contrast suggesting diverticulitis at the descending/sigmoid junction without abscess was thought to be etiology. Pt started on IV Cipro/Flagyl before pt then developed worsening out of proportion vomiting for diverticulitis. Medical management included reglan, Phenergan and Zofran (8mg ) with little relief. There was need to have N/V controlled to avoid increased abd pressure given AAA of 5.8cm. GI was subsequently consulted for assistance with managing intractable vomiting and recommendations appreciated. Patient switched to scheduled Zofran with Phenergan and Ativan prn for breakthrough nausea, morphine switched to dilaudid and that provided patient with complete relief and IV Abx were d/c at that time. CTA chest/abd/pelvis was completed for concern of an ischemic component but did not reveal an etiology for intractable vomiting, just stable mild diverticulitis w/o abscess and 5.8cm AAA w/o evidence or rupture or complicating features. Pt had complete resolution by discharge and was given zofran outpatient.   Peripheral catheter-related cellulitis: During patient hospitalization he developed an erythematous and warmth at site of prior peripheral IV  that appeared very typical and concerning for cellulitis. He was re-blood cultured that showed no growth. He did have periods of fever as high as 102F that resolved with IV Vanc/Cefepime initially then IV ceftriaxone before transitioning to oral keflex 500 TID to complete 7 day treatment.    AAA: CT abdomen on admission incidentally revealed 5.8cm AAA. Pt has ~ 30 pack year smoking history. No signs of leak or rupture but > 5.5cm requires surgical repair. Vascular surgery was then consulted and did not feel the patient's symptoms of intractable nausea/vomiting were related to AAA, which was supported by findings of  CTA reported above. Cardiology conducted pre-op evaluation in setting of pts previous CAD with myoview completed 05/28 - no perfusion defects or evidence of stress-induced ischemia was found and the patient was cleared for surgery from a cardiac standpoint. Vasular Surgery will hold off on elective repair until acute issues resolve and felt comfortable discharging the patient to return on 06/18/13 for repair.    Anemia/thrombocytopenia/splenomegaly: Pt had hgb 10.2 >> 7.9 this admission. No gross blood loss noted. MCV normal. Haptoglobin 223 and LDH 213 so doubt hemolysis. The recent dip was originally thought to be dilutional and GI didn't feel full workup including endoscopy was needed at this time. Anemia panel was essentially normal. Therefore at this time there is concern for lymphoma given continued normocytic anemia and splenomegaly on abd CT along with some nonspecific adenopathy. There is need to obtain lymph bx during AAA repair this message will be made on d/c summary and sent to Dr. Trula Slade and PCP to help coordinate.    Tobacco use disorder: ~ 30 pack year history. Likely contributed to AAA above. He has quit successfully in the past for 8 months. Pt was discharged on nicotine patches. Education and encouragement for smoking cessation was provided.   Discharge Vitals:   BP 112/69  Pulse 76  Temp(Src) 99.8 F (37.7 C) (Oral)  Resp 18  Ht 5\' 10"  (1.778 m)  Wt 215 lb 1.6 oz (97.569 kg)  BMI 30.86 kg/m2  SpO2 98%  Discharge Labs:  Results for orders placed during the hospital encounter of 06/05/13 (from the past 24 hour(s))  BASIC METABOLIC PANEL     Status: Abnormal   Collection Time    06/12/13  4:54 AM      Result Value Ref Range   Sodium 138  137 - 147 mEq/L   Potassium 3.7  3.7 - 5.3 mEq/L   Chloride 103  96 - 112 mEq/L   CO2 25  19 - 32 mEq/L   Glucose, Bld 111 (*) 70 - 99 mg/dL   BUN 15  6 - 23 mg/dL   Creatinine, Ser 1.14  0.50 - 1.35 mg/dL   Calcium 8.2 (*) 8.4 - 10.5  mg/dL   GFR calc non Af Amer 65 (*) >90 mL/min   GFR calc Af Amer 76 (*) >90 mL/min  CBC     Status: Abnormal   Collection Time    06/12/13  4:54 AM      Result Value Ref Range   WBC 5.8  4.0 - 10.5 K/uL   RBC 2.87 (*) 4.22 - 5.81 MIL/uL   Hemoglobin 8.4 (*) 13.0 - 17.0 g/dL   HCT 25.8 (*) 39.0 - 52.0 %   MCV 89.9  78.0 - 100.0 fL   MCH 29.3  26.0 - 34.0 pg   MCHC 32.6  30.0 - 36.0 g/dL   RDW 20.2 (*) 11.5 - 15.5 %  Platelets 120 (*) 150 - 400 K/uL    Signed: Clinton Gallant, MD 06/12/2013, 2:12 PM   Time Spent on Discharge: >30 minutes Services Ordered on Discharge: none Equipment Ordered on Discharge: none  Attending physician discharge note: I personally examined this patient on the day of discharge and reviewed his discharge plans. I concur with the evaluation and plan as recorded above by resident physician Dr. Clinton Gallant. The patient will be readmitted next week for aortic abdominal aneurysm surgery.  Murriel Hopper, MD, [place]  Hematology-Oncology/Internal Medicine

## 2013-06-12 NOTE — Progress Notes (Signed)
Patient d/c to home, PICC line removed, instructions reviewed.  Per Dr. Algis Liming, do not take daily ASA for impending AAA surgery next week.

## 2013-06-13 ENCOUNTER — Emergency Department (HOSPITAL_COMMUNITY): Payer: Medicare Other

## 2013-06-13 ENCOUNTER — Inpatient Hospital Stay (HOSPITAL_COMMUNITY)
Admission: EM | Admit: 2013-06-13 | Discharge: 2013-06-21 | DRG: 418 | Disposition: A | Discharge status: Home / Self Care | Payer: Medicare Other | Attending: Internal Medicine | Admitting: Internal Medicine

## 2013-06-13 ENCOUNTER — Encounter (HOSPITAL_COMMUNITY): Payer: Self-pay | Admitting: Emergency Medicine

## 2013-06-13 DIAGNOSIS — IMO0002 Reserved for concepts with insufficient information to code with codable children: Secondary | ICD-10-CM | POA: Diagnosis present

## 2013-06-13 DIAGNOSIS — D696 Thrombocytopenia, unspecified: Secondary | ICD-10-CM

## 2013-06-13 DIAGNOSIS — R112 Nausea with vomiting, unspecified: Secondary | ICD-10-CM | POA: Diagnosis present

## 2013-06-13 DIAGNOSIS — L02818 Cutaneous abscess of other sites: Secondary | ICD-10-CM

## 2013-06-13 DIAGNOSIS — Z7982 Long term (current) use of aspirin: Secondary | ICD-10-CM

## 2013-06-13 DIAGNOSIS — I714 Abdominal aortic aneurysm, without rupture, unspecified: Secondary | ICD-10-CM

## 2013-06-13 DIAGNOSIS — Z79899 Other long term (current) drug therapy: Secondary | ICD-10-CM

## 2013-06-13 DIAGNOSIS — K8010 Calculus of gallbladder with chronic cholecystitis without obstruction: Principal | ICD-10-CM | POA: Diagnosis present

## 2013-06-13 DIAGNOSIS — K8020 Calculus of gallbladder without cholecystitis without obstruction: Secondary | ICD-10-CM

## 2013-06-13 DIAGNOSIS — I2510 Atherosclerotic heart disease of native coronary artery without angina pectoris: Secondary | ICD-10-CM | POA: Diagnosis present

## 2013-06-13 DIAGNOSIS — K811 Chronic cholecystitis: Secondary | ICD-10-CM

## 2013-06-13 DIAGNOSIS — L0390 Cellulitis, unspecified: Secondary | ICD-10-CM | POA: Diagnosis present

## 2013-06-13 DIAGNOSIS — J189 Pneumonia, unspecified organism: Secondary | ICD-10-CM

## 2013-06-13 DIAGNOSIS — L03818 Cellulitis of other sites: Secondary | ICD-10-CM

## 2013-06-13 DIAGNOSIS — F17200 Nicotine dependence, unspecified, uncomplicated: Secondary | ICD-10-CM | POA: Diagnosis present

## 2013-06-13 DIAGNOSIS — Z9861 Coronary angioplasty status: Secondary | ICD-10-CM

## 2013-06-13 DIAGNOSIS — R161 Splenomegaly, not elsewhere classified: Secondary | ICD-10-CM | POA: Diagnosis present

## 2013-06-13 DIAGNOSIS — I809 Phlebitis and thrombophlebitis of unspecified site: Secondary | ICD-10-CM | POA: Diagnosis present

## 2013-06-13 DIAGNOSIS — E872 Acidosis, unspecified: Secondary | ICD-10-CM | POA: Diagnosis present

## 2013-06-13 DIAGNOSIS — Z8249 Family history of ischemic heart disease and other diseases of the circulatory system: Secondary | ICD-10-CM

## 2013-06-13 DIAGNOSIS — D649 Anemia, unspecified: Secondary | ICD-10-CM

## 2013-06-13 DIAGNOSIS — Z6835 Body mass index (BMI) 35.0-35.9, adult: Secondary | ICD-10-CM

## 2013-06-13 DIAGNOSIS — E440 Moderate protein-calorie malnutrition: Secondary | ICD-10-CM | POA: Diagnosis present

## 2013-06-13 DIAGNOSIS — E785 Hyperlipidemia, unspecified: Secondary | ICD-10-CM | POA: Diagnosis present

## 2013-06-13 DIAGNOSIS — I10 Essential (primary) hypertension: Secondary | ICD-10-CM | POA: Diagnosis present

## 2013-06-13 DIAGNOSIS — Y95 Nosocomial condition: Secondary | ICD-10-CM

## 2013-06-13 DIAGNOSIS — D61818 Other pancytopenia: Secondary | ICD-10-CM | POA: Diagnosis not present

## 2013-06-13 DIAGNOSIS — I252 Old myocardial infarction: Secondary | ICD-10-CM

## 2013-06-13 LAB — COMPREHENSIVE METABOLIC PANEL
ALBUMIN: 3.9 g/dL (ref 3.5–5.2)
ALK PHOS: 64 U/L (ref 39–117)
ALT: 19 U/L (ref 0–53)
AST: 22 U/L (ref 0–37)
BUN: 21 mg/dL (ref 6–23)
CO2: 24 mEq/L (ref 19–32)
Calcium: 9.1 mg/dL (ref 8.4–10.5)
Chloride: 99 mEq/L (ref 96–112)
Creatinine, Ser: 1.23 mg/dL (ref 0.50–1.35)
GFR calc Af Amer: 69 mL/min — ABNORMAL LOW (ref >90)
GFR, EST NON AFRICAN AMERICAN: 59 mL/min — AB (ref >90)
Glucose, Bld: 125 mg/dL — ABNORMAL HIGH (ref 70–99)
POTASSIUM: 3.8 meq/L (ref 3.7–5.3)
Sodium: 137 mEq/L (ref 137–147)
Total Bilirubin: 0.5 mg/dL (ref 0.3–1.2)
Total Protein: 7.8 g/dL (ref 6.0–8.3)

## 2013-06-13 LAB — CBC WITH DIFFERENTIAL/PLATELET
BASOS ABS: 0 10*3/uL (ref 0.0–0.1)
BASOS PCT: 0 % (ref 0–1)
EOS ABS: 0.1 10*3/uL (ref 0.0–0.7)
Eosinophils Relative: 1 % (ref 0–5)
HEMATOCRIT: 29.8 % — AB (ref 39.0–52.0)
HEMOGLOBIN: 10 g/dL — AB (ref 13.0–17.0)
LYMPHS PCT: 10 % — AB (ref 12–46)
Lymphs Abs: 0.7 10*3/uL (ref 0.7–4.0)
MCH: 29.9 pg (ref 26.0–34.0)
MCHC: 33.6 g/dL (ref 30.0–36.0)
MCV: 89.2 fL (ref 78.0–100.0)
Monocytes Absolute: 0.7 10*3/uL (ref 0.1–1.0)
Monocytes Relative: 11 % (ref 3–12)
NEUTROS ABS: 5.3 10*3/uL (ref 1.7–7.7)
NEUTROS PCT: 78 % — AB (ref 43–77)
Platelets: 143 10*3/uL — ABNORMAL LOW (ref 150–400)
RBC: 3.34 MIL/uL — ABNORMAL LOW (ref 4.22–5.81)
RDW: 20.1 % — ABNORMAL HIGH (ref 11.5–15.5)
WBC: 6.8 10*3/uL (ref 4.0–10.5)

## 2013-06-13 LAB — URINALYSIS, ROUTINE W REFLEX MICROSCOPIC
BILIRUBIN URINE: NEGATIVE
Glucose, UA: NEGATIVE mg/dL
Ketones, ur: NEGATIVE mg/dL
Nitrite: NEGATIVE
PH: 6 (ref 5.0–8.0)
PROTEIN: 30 mg/dL — AB
Specific Gravity, Urine: 1.025 (ref 1.005–1.030)
Urobilinogen, UA: 0.2 mg/dL (ref 0.0–1.0)

## 2013-06-13 LAB — URINE MICROSCOPIC-ADD ON

## 2013-06-13 LAB — STREP PNEUMONIAE URINARY ANTIGEN: Strep Pneumo Urinary Antigen: NEGATIVE

## 2013-06-13 LAB — I-STAT CG4 LACTIC ACID, ED: Lactic Acid, Venous: 2.49 mmol/L — ABNORMAL HIGH (ref 0.5–2.2)

## 2013-06-13 MED ORDER — SODIUM CHLORIDE 0.9 % IJ SOLN
3.0000 mL | Freq: Two times a day (BID) | INTRAMUSCULAR | Status: DC
  Administered 2013-06-13 – 2013-06-21 (×6): 3 mL via INTRAVENOUS

## 2013-06-13 MED ORDER — PROMETHAZINE HCL 25 MG/ML IJ SOLN
12.5000 mg | Freq: Once | INTRAMUSCULAR | Status: DC
  Filled 2013-06-13: qty 1

## 2013-06-13 MED ORDER — ASPIRIN EC 81 MG PO TBEC
81.0000 mg | DELAYED_RELEASE_TABLET | Freq: Every day | ORAL | Status: DC
  Administered 2013-06-13 – 2013-06-21 (×6): 81 mg via ORAL
  Filled 2013-06-13 (×9): qty 1

## 2013-06-13 MED ORDER — LORAZEPAM 2 MG/ML IJ SOLN: 0.5000 mg | INTRAMUSCULAR | Status: DC

## 2013-06-13 MED ORDER — SODIUM CHLORIDE 0.9 % IV BOLUS (SEPSIS)
1000.0000 mL | Freq: Once | INTRAVENOUS | Status: AC
  Administered 2013-06-13: 1000 mL via INTRAVENOUS

## 2013-06-13 MED ORDER — HEPARIN SODIUM (PORCINE) 5000 UNIT/ML IJ SOLN
5000.0000 [IU] | Freq: Three times a day (TID) | INTRAMUSCULAR | Status: DC
  Administered 2013-06-13 – 2013-06-21 (×19): 5000 [IU] via SUBCUTANEOUS
  Filled 2013-06-13 (×26): qty 1

## 2013-06-13 MED ORDER — LORAZEPAM 2 MG/ML IJ SOLN: 0.5000 mg | INTRAMUSCULAR | Status: DC | PRN

## 2013-06-13 MED ORDER — IOHEXOL 300 MG/ML  SOLN
20.0000 mL | INTRAMUSCULAR | Status: AC
  Administered 2013-06-13: 25 mL via ORAL
  Administered 2013-06-14: 20 mL via ORAL

## 2013-06-13 MED ORDER — METOPROLOL TARTRATE 12.5 MG HALF TABLET
12.5000 mg | ORAL_TABLET | Freq: Two times a day (BID) | ORAL | Status: DC
  Administered 2013-06-13 – 2013-06-21 (×11): 12.5 mg via ORAL
  Filled 2013-06-13 (×17): qty 1

## 2013-06-13 MED ORDER — SODIUM CHLORIDE 0.9 % IV SOLN
INTRAVENOUS | Status: AC
  Administered 2013-06-13 – 2013-06-14 (×2): via INTRAVENOUS

## 2013-06-13 MED ORDER — VANCOMYCIN HCL IN DEXTROSE 750-5 MG/150ML-% IV SOLN
750.0000 mg | Freq: Two times a day (BID) | INTRAVENOUS | Status: DC
  Filled 2013-06-13: qty 150

## 2013-06-13 MED ORDER — PANTOPRAZOLE SODIUM 40 MG PO TBEC
40.0000 mg | DELAYED_RELEASE_TABLET | Freq: Two times a day (BID) | ORAL | Status: DC
  Administered 2013-06-13 – 2013-06-17 (×7): 40 mg via ORAL
  Filled 2013-06-13 (×9): qty 1

## 2013-06-13 MED ORDER — NITROGLYCERIN 0.4 MG SL SUBL: 0.4000 mg | SUBLINGUAL_TABLET | SUBLINGUAL | Status: DC | PRN

## 2013-06-13 MED ORDER — NICOTINE 14 MG/24HR TD PT24
14.0000 mg | MEDICATED_PATCH | Freq: Every day | TRANSDERMAL | Status: DC
Start: 2013-06-13 — End: 2013-06-21
  Administered 2013-06-14 – 2013-06-21 (×6): 14 mg via TRANSDERMAL
  Filled 2013-06-13 (×10): qty 1

## 2013-06-13 MED ORDER — ONDANSETRON 8 MG/NS 50 ML IVPB
8.0000 mg | Freq: Four times a day (QID) | INTRAVENOUS | Status: DC
  Administered 2013-06-13 – 2013-06-14 (×3): 8 mg via INTRAVENOUS
  Filled 2013-06-13 (×4): qty 8

## 2013-06-13 MED ORDER — HYDROMORPHONE HCL PF 1 MG/ML IJ SOLN
0.5000 mg | Freq: Once | INTRAMUSCULAR | Status: AC
  Administered 2013-06-13: 0.5 mg via INTRAVENOUS
  Filled 2013-06-13: qty 1

## 2013-06-13 MED ORDER — ONDANSETRON HCL 4 MG/2ML IJ SOLN
4.0000 mg | Freq: Once | INTRAMUSCULAR | Status: AC
  Administered 2013-06-13: 4 mg via INTRAVENOUS
  Filled 2013-06-13: qty 2

## 2013-06-13 MED ORDER — PROMETHAZINE HCL 25 MG/ML IJ SOLN: 12.5000 mg | Freq: Four times a day (QID) | INTRAMUSCULAR | Status: DC | PRN

## 2013-06-13 MED ORDER — LORAZEPAM 2 MG/ML IJ SOLN
1.0000 mg | Freq: Once | INTRAMUSCULAR | Status: AC
  Administered 2013-06-13: 1 mg via INTRAVENOUS
  Filled 2013-06-13: qty 1

## 2013-06-13 MED ORDER — METOCLOPRAMIDE HCL 5 MG/ML IJ SOLN
10.0000 mg | Freq: Once | INTRAMUSCULAR | Status: AC
  Administered 2013-06-13: 10 mg via INTRAVENOUS
  Filled 2013-06-13: qty 2

## 2013-06-13 MED ORDER — ATORVASTATIN CALCIUM 20 MG PO TABS
20.0000 mg | ORAL_TABLET | Freq: Every day | ORAL | Status: DC
  Administered 2013-06-13 – 2013-06-20 (×6): 20 mg via ORAL
  Filled 2013-06-13 (×9): qty 1

## 2013-06-13 MED ORDER — HYDROMORPHONE HCL PF 1 MG/ML IJ SOLN
0.5000 mg | INTRAMUSCULAR | Status: DC | PRN
  Administered 2013-06-13 (×2): 1 mg via INTRAVENOUS
  Filled 2013-06-13 (×2): qty 1

## 2013-06-13 MED ORDER — ONDANSETRON HCL 4 MG/2ML IJ SOLN: 4.0000 mg | Freq: Four times a day (QID) | INTRAMUSCULAR | Status: DC | PRN

## 2013-06-13 MED ORDER — VANCOMYCIN HCL 10 G IV SOLR
1500.0000 mg | Freq: Once | INTRAVENOUS | Status: AC
  Administered 2013-06-13: 1500 mg via INTRAVENOUS
  Filled 2013-06-13: qty 1500

## 2013-06-13 MED ORDER — PIPERACILLIN-TAZOBACTAM 3.375 G IVPB 30 MIN
3.3750 g | Freq: Once | INTRAVENOUS | Status: AC
  Administered 2013-06-13: 3.375 g via INTRAVENOUS
  Filled 2013-06-13: qty 50

## 2013-06-13 MED ORDER — DEXTROSE 5 % IV SOLN
1.0000 g | Freq: Three times a day (TID) | INTRAVENOUS | Status: DC
  Administered 2013-06-13 – 2013-06-14 (×2): 1 g via INTRAVENOUS
  Filled 2013-06-13 (×3): qty 1

## 2013-06-13 MED ORDER — PROMETHAZINE HCL 25 MG/ML IJ SOLN
12.5000 mg | Freq: Four times a day (QID) | INTRAMUSCULAR | Status: DC | PRN
  Administered 2013-06-13: 12.5 mg via INTRAVENOUS

## 2013-06-13 NOTE — ED Provider Notes (Signed)
CSN: [id number]     Arrival date & time 06/13/13  1529 History   First MD Initiated Contact with Patient 06/13/13 1541     Chief Complaint  Patient presents with  . Abdominal Pain    dx AAA 06/05/13, n/v      Patient is a 68 year old male with past medical history as below relevant for being discharged yesterday after being treated for nausea, vomiting, and found to have a  5.8 cm AAA.  Patient states he was doing well until last night and began having a fever up to 101 orally. He took Tylenol and this resolved fevers. However today he had one bout of nonbloody nonbilious emesis. Afterwards patient noticed abdominal tightness and thus he called the ambulance. Currently has complaints of nausea,foreskin being "raw,"  but no other symptoms.      (Consider location/radiation/quality/duration/timing/severity/associated sxs/prior Treatment) Patient is a 68 y.o. male presenting with abdominal pain. The history is provided by the patient and medical records. No language interpreter was used.  Abdominal Pain Pain location:  Generalized Pain quality: squeezing   Pain radiates to:  Does not radiate Pain severity:  Moderate Onset quality:  Sudden Duration:  1 hour Timing:  Constant Progression:  Resolved Chronicity:  Recurrent Associated symptoms: fever and nausea   Risk factors: recent hospitalization     Past Medical History  Diagnosis Date  . Coronary atherosclerosis of native coronary artery     a. 10/2008 inf STEMI/PCI: LM 50d (IVUS-borderline lesion->med rx), LAD min irregs, LCX 109m, 70d, OM nl, RCA 114m (3.5x28 Vision BMS);  b. 11/2008 Lexiscan MV: EF 65%, no isch/scar.  . Essential hypertension, benign   . Pure hypercholesterolemia   . AAA (abdominal aortic aneurysm)     a. 05/2013 CT: 5.8 cm AAA.  Marland Kitchen Diverticulitis     a. 05/2013 CT: descending/sigmoid jxn w/o abscess.  . Osteoarthritis   . Tobacco abuse     a. ongoing - 1ppd for better part of 50 yrs.  . Normocytic anemia   .  Cellulitis 06/10/2013    Right antecubital fossa at site of IV  03/12/13   Past Surgical History  Procedure Laterality Date  . Cardiac stents  2010   Family History  Problem Relation Age of Onset  . Heart attack Brother   . Cancer Father   . Cancer Mother    History  Substance Use Topics  . Smoking status: Heavy Tobacco Smoker -- 1.00 packs/day for 50 years    Types: Cigarettes    Last Attempt to Quit: 07/25/2010  . Smokeless tobacco: Never Used  . Alcohol Use: No    Review of Systems  Constitutional: Positive for fever.  Gastrointestinal: Positive for nausea and abdominal pain.  All other systems reviewed and are negative.     Allergies  Review of patient's allergies indicates no known allergies.  Home Medications   Prior to Admission medications   Medication Sig Start Date End Date Taking? Authorizing Provider  Acetaminophen (TYLENOL PO) Take 1 tablet by mouth every 6 (six) hours as needed (pain).   Yes Historical Provider, MD  cephALEXin (KEFLEX) 500 MG capsule Take 1 capsule (500 mg total) by mouth every 6 (six) hours. 06/12/13  Yes Clinton Gallant, MD  metoprolol tartrate (LOPRESSOR) 25 MG tablet Take 0.5 tablets (12.5 mg total) by mouth 2 (two) times daily. 10/13/12  Yes Sherren Mocha, MD  nitroGLYCERIN (NITROSTAT) 0.4 MG SL tablet Place 1 tablet (0.4 mg total) under the tongue every 5 (five) minutes  as needed. 03/24/12  Yes Sherren Mocha, MD  ondansetron (ZOFRAN) 4 MG tablet Take 1 tablet (4 mg total) by mouth every 8 (eight) hours as needed for nausea or vomiting. 06/12/13  Yes Clinton Gallant, MD  rosuvastatin (CRESTOR) 10 MG tablet Take 10 mg by mouth daily.   Yes Historical Provider, MD  aspirin 81 MG tablet Take 81 mg by mouth daily.      Historical Provider, MD   BP 127/93  Pulse 77  Temp(Src) 98.9 F (37.2 C) (Oral)  Resp 18  Ht 5\' 10"  (1.778 m)  Wt 188 lb (85.276 kg)  BMI 26.98 kg/m2  SpO2 97% Physical Exam  Nursing note and vitals reviewed. Constitutional: He  is oriented to person, place, and time. He appears well-developed and well-nourished.  HENT:  Head: Normocephalic and atraumatic.  Right Ear: External ear normal.  Left Ear: External ear normal.  Nose: Nose normal.  Eyes: Conjunctivae and EOM are normal. Pupils are equal, round, and reactive to light.  Neck: Normal range of motion. Neck supple.  Cardiovascular: Normal rate, regular rhythm, normal heart sounds and intact distal pulses.   Pulmonary/Chest: Effort normal. He has rales (RLL).  Abdominal: Soft. Bowel sounds are normal. He exhibits no distension. There is no tenderness. There is no rebound.  Musculoskeletal: Normal range of motion. He exhibits no edema and no tenderness.  Neurological: He is alert and oriented to person, place, and time. He has normal reflexes.  Skin: Skin is warm and dry.  Erythema to foreskin with mild TTP - no exudate  Psychiatric: He has a normal mood and affect.    ED Course  Procedures (including critical care time) Labs Review Labs Reviewed  CBC WITH DIFFERENTIAL - Abnormal; Notable for the following:    RBC 3.34 (*)    Hemoglobin 10.0 (*)    HCT 29.8 (*)    RDW 20.1 (*)    Platelets 143 (*)    Neutrophils Relative % 78 (*)    Lymphocytes Relative 10 (*)    All other components within normal limits  COMPREHENSIVE METABOLIC PANEL - Abnormal; Notable for the following:    Glucose, Bld 125 (*)    GFR calc non Af Amer 59 (*)    GFR calc Af Amer 69 (*)    All other components within normal limits  URINALYSIS, ROUTINE W REFLEX MICROSCOPIC - Abnormal; Notable for the following:    APPearance CLOUDY (*)    Hgb urine dipstick TRACE (*)    Protein, ur 30 (*)    Leukocytes, UA SMALL (*)    All other components within normal limits  URINE MICROSCOPIC-ADD ON - Abnormal; Notable for the following:    Casts HYALINE CASTS (*)    All other components within normal limits  I-STAT CG4 LACTIC ACID, ED - Abnormal; Notable for the following:    Lactic Acid,  Venous 2.49 (*)    All other components within normal limits  CULTURE, BLOOD (ROUTINE X 2)  CULTURE, BLOOD (ROUTINE X 2)  CULTURE, EXPECTORATED SPUTUM-ASSESSMENT  GRAM STAIN  LEGIONELLA ANTIGEN, URINE  STREP PNEUMONIAE URINARY ANTIGEN  BASIC METABOLIC PANEL  CBC    Imaging Review Dg Chest 2 View  06/13/2013   ADDENDUM REPORT: 06/13/2013 17:45  ADDENDUM: Voice recognition errror:  The last line of the findings should read " NO pneumothorax."  The first line of the impression should read "VERY FINE airspace disease"   Electronically Signed   By: Suzy Bouchard M.D.   On:  06/13/2013 17:45   06/13/2013   CLINICAL DATA:  Abdominal pain, nausea and vomiting  EXAM: CHEST  2 VIEW  COMPARISON:  Radiograph 523 1,015  FINDINGS: Normal cardiac silhouette. Aorta is ectatic. There is fine nodular airspace right lower lobe which is new prior. No focal consolidation. No pleural fluid. Pneumothorax.  IMPRESSION: Verify airspace disease in the right lower lobe suggesting pulmonary edema or infection.  Electronically Signed: By: Suzy Bouchard M.D. On: 06/13/2013 17:27     EKG Interpretation None        EMERGENCY DEPARTMENT [place] ABD/AORTA EXAM Study: Limited Ultrasound of the Abdominal Aorta.  INDICATIONS:Abdominal pain Indication: Multiple views of the abdominal aorta are obtained from the diaphragmatic hiatus to the aortic bifurcation in transverse and sagittal planes with a multi- Frequency probe.  PERFORMED BY: Myself  IMAGES ARCHIVED?: Yes  FINDINGS: Free fluid absent  LIMITATIONS:  Abdominal pain  INTERPRETATION:  Abdominal aortic aneurysm present  COMMENT:  Known 5.8 cm AAA seen but no free fluid.    MDM   Final diagnoses:  Hospital acquired PNA  Lactic acidosis     Patient presents to the emergency department with complaints of emesis, abdominal tightness, and fever. Vital signs upon arrival remarkable for no fever, no tachycardia, no hypotension. Physical exam as above and  remarkable for no abdominal tenderness to palpation, right lower lobe crackles, and otherwise as above. Given report of fever there was concern for possible pneumonia, UTI,. Chest x-ray shows right lower lobe infiltrates concerning for infection. Given patient was recently hospitalized for 7 days is felt that the vancomycin and Zosyn were appropriate for HCAP Coverage. In regards to patient's abdominal tenderness and known AAA, patient stated that his pain had significantly improved without intervention, his abdominal exam was remarkable for no rebound or guarding and bedside ultrasound showed no free fluid and stable size AAA. It was not felt that additional imaging was warranted at this point. However will have low threshold for intervention/imaging if exam or pain worsens. Patient had additional bouts of nonbloody nonbilious emesis while in the ED and he was given multiple doses of antiemetics. Given HCAP and intractable nausea/vomiting it was felt that admission was warranted.     Corlis Leak, MD 06/13/13 [phone]

## 2013-06-13 NOTE — ED Notes (Signed)
Pt is still vomiting. MD made aware. Green emesis.

## 2013-06-13 NOTE — ED Provider Notes (Signed)
Date: 06/13/2013  Rate: 71  Rhythm: normal sinus rhythm  QRS Axis: right  Intervals: normal  ST/T Wave abnormalities: normal  Conduction Disutrbances:none     Sharyon Cable, MD 06/13/13 [phone]

## 2013-06-13 NOTE — ED Notes (Addendum)
Blood cultures drawn prior to antibiotic administration.

## 2013-06-13 NOTE — ED Provider Notes (Signed)
I have personally seen and examined the patient.  I have discussed the plan of care with the resident.  I have reviewed the documentation on PMH/FH/Soc. History.  I have reviewed the documentation of the resident and agree.   Sharyon Cable, MD 06/13/13 [phone]

## 2013-06-13 NOTE — ED Notes (Signed)
I Stat Lactic Acid results shown to Dr. Gala Murdoch

## 2013-06-13 NOTE — ED Notes (Addendum)
Pt brought in by EMS from home for abdominal pain, N/V/D. Pt sts abdominal pain started today around 1430, pt has had nausea and vomited x1 and diarrhea x2 .  Pt stated having temp 101.0 last night with chills. Vitals stable. Pt had Zofran per EMS.

## 2013-06-13 NOTE — H&P (Signed)
Date: 06/13/2013               Patient Name:  [name] MRN: [id number]  DOB: October 31, 1945 Age / Sex: 68 y.o., male   PCP: Sherren Mocha, MD         Medical Service: Internal Medicine Teaching Service         Attending Physician: Dr. Annia Belt, MD    First Contact: Dr. Mechele Claude Pager: [phone]  Second Contact: Dr. Alice Rieger Pager: [phone]       After Hours (After 5p/  First Contact Pager: [phone]  weekends / holidays): Second Contact Pager: [phone]   Chief Complaint: Nausea, vomiting, cough  History of Present Illness: [name] is a 68 y.o. male w/ PMHx of HTN, HLD, CAD (STEMI w/ PCI to LM, 2010), AAA (5.8 cm), Diverticulitis, OA, normocytic anemia, and tobacco abuse, presents to the ED w/ complaints of continued nausea/vomiting. The patient was discharged yesterday (06/12/13), after being admitted for the same complaints and treated for diverticulitis vs possible gastroenteritis. Patient claims that when he was discharged, his symptoms were completely resolved and he felt well, tolerating po intake. When he got home, he had some food (sausage, egg, coffee) and says he tolerated that well for some time. Later on in the evening, patient started to feel nauseous once again, and had an episode of NBNB emesis w/ associated abdominal cramping and pain. The patient also admitted to one episode of diarrhea yesterday evening as well, but denies blood or mucus. The patient also admits to a fever at home of 101.2 and associated chills. No dizziness, lightheadedness, chest pain, SOB, or palpitations.  During previous admission, patient had an incidentally discovered 5.8 cm AAA via CT abdomen/pelvis on 06/05/13  And follow up CTA abdomen/pelvis which showed fusiform infrarenal abdominal aortic aneurysm without evidence of rupture or other complicating features. Patient is scheduled to have a repair on 06/18/13.  During previous admission, patient also had a right AC fossa cellulitis  2/2 peripheral IV which he was treated for w/ IV antibiotics and sent home w/ a Rx for Keflex. On exam today, no significant erythema, swelling, pain, or increased warmth.  While in ED, patient also received CXR which showed very fine airspace disease in the right lower lobe suggesting pulmonary  edema or infection.  Meds: Current Facility-Administered Medications  Medication Dose Route Frequency Provider Last Rate Last Dose  . promethazine (PHENERGAN) injection 12.5 mg  12.5 mg Intravenous Once Duwaine Maxin, DO      . vancomycin (VANCOCIN) 1,500 mg in sodium chloride 0.9 % 500 mL IVPB  1,500 mg Intravenous Once Corlis Leak, MD   1,500 mg at 06/13/13 1905    Allergies: Allergies as of 06/13/2013  . (No Known Allergies)   Past Medical History  Diagnosis Date  . Coronary atherosclerosis of native coronary artery     a. 10/2008 inf STEMI/PCI: LM 50d (IVUS-borderline lesion->med rx), LAD min irregs, LCX 77m, 70d, OM nl, RCA 149m (3.5x28 Vision BMS);  b. 11/2008 Lexiscan MV: EF 65%, no isch/scar.  . Essential hypertension, benign   . Pure hypercholesterolemia   . AAA (abdominal aortic aneurysm)     a. 05/2013 CT: 5.8 cm AAA.  Marland Kitchen Diverticulitis     a. 05/2013 CT: descending/sigmoid jxn w/o abscess.  . Osteoarthritis   . Tobacco abuse     a. ongoing - 1ppd for better part of 50 yrs.  . Normocytic anemia   . Cellulitis 06/10/2013  Right antecubital fossa at site of IV  03/12/13   Past Surgical History  Procedure Laterality Date  . Cardiac stents  2010   Family History  Problem Relation Age of Onset  . Heart attack Brother   . Cancer Father   . Cancer Mother    History   Social History  . Marital Status: Married    Spouse Name: N/A    Number of Children: N/A  . Years of Education: N/A   Occupational History  . Not on file.   Social History Main Topics  . Smoking status: Heavy Tobacco Smoker -- 1.00 packs/day for 50 years    Types: Cigarettes    Last Attempt to Quit:  07/25/2010  . Smokeless tobacco: Never Used  . Alcohol Use: No  . Drug Use: No  . Sexual Activity: Not on file   Other Topics Concern  . Not on file   Social History Narrative   Lives in [place] with wife.  Retired Furniture conservator/restorer.  Works out in the yard often - limited by claudication.  Does not routinely exercise.    Review of Systems: General: Denies fever, chills, diaphoresis, appetite change and fatigue.  Respiratory: Denies SOB, DOE, cough, chest tightness, and wheezing.   Cardiovascular: Denies chest pain and palpitations.  Gastrointestinal: Positive for nausea, vomiting, abdominal pain. Denies diarrhea, constipation, blood in stool and abdominal distention.  Genitourinary: Denies dysuria, urgency, frequency, hematuria, and flank pain. Endocrine: Denies hot or cold intolerance, polyuria, and polydipsia. Musculoskeletal: Denies myalgias, back pain, joint swelling, arthralgias and gait problem.  Skin: Denies pallor, rash and wounds.  Neurological: Denies dizziness, seizures, syncope, weakness, lightheadedness, numbness and headaches.  Psychiatric/Behavioral: Denies mood changes, confusion, nervousness, sleep disturbance and agitation.   Physical Exam: Filed Vitals:   06/13/13 1638 06/13/13 1741 06/13/13 1745 06/13/13 1907  BP: 133/69 133/64 130/73 145/72  Pulse: 73 80 79 71  Temp:    98.5 F (36.9 C)  TempSrc:    Oral  Resp: 17 18 17 16   Height:    5' 1.2" (1.554 m)  Weight:    191 lb 5.8 oz (86.8 kg)  SpO2: 100% 99% 98% 97%   General: Vital signs reviewed.  Patient is a well-developed and well-nourished, in no acute distress and cooperative with exam.  Head: Normocephalic and atraumatic. Eyes: PERRL, EOMI, conjunctivae normal, No scleral icterus.  Neck: Supple, trachea midline, normal ROM, No JVD, masses, thyromegaly, or carotid bruit present.  Cardiovascular: RRR, S1 normal, S2 normal, no murmurs, gallops, or rubs. Pulmonary/Chest: Air entry equal bilaterally, no wheezes,  rales, or rhonchi. Abdominal: Soft, mildly tender to palpation diffusely, non-distended, BS +, no masses, organomegaly, or guarding present. Murphy's negative.  Musculoskeletal: No joint deformities, erythema, or stiffness, ROM full and nontender. Extremities: No swelling or edema. Pulses symmetric and intact bilaterally. No cyanosis or clubbing. Neurological: A&O x3, Strength is normal and symmetric bilaterally, cranial nerve II-XII are grossly intact, no focal motor deficit, sensory intact to light touch bilaterally.  Skin: Warm, dry and intact. No rashes or erythema. Psychiatric: Normal mood and affect. speech and behavior is normal. Cognition and memory are normal.    Lab results: Basic Metabolic Panel:  Recent Labs  06/12/13 0454 06/13/13 1622  NA 138 137  K 3.7 3.8  CL 103 99  CO2 25 24  GLUCOSE 111* 125*  BUN 15 21  CREATININE 1.14 1.23  CALCIUM 8.2* 9.1   Liver Function Tests:  Recent Labs  06/13/13 1622  AST 22  ALT 19  ALKPHOS 64  BILITOT 0.5  PROT 7.8  ALBUMIN 3.9   CBC:  Recent Labs  06/12/13 0454 06/13/13 1622  WBC 5.8 6.8  NEUTROABS  --  5.3  HGB 8.4* 10.0*  HCT 25.8* 29.8*  MCV 89.9 89.2  PLT 120* 143*    Imaging results:  Dg Chest 2 View  06/13/2013   ADDENDUM REPORT: 06/13/2013 17:45  ADDENDUM: Voice recognition errror:  The last line of the findings should read " NO pneumothorax."  The first line of the impression should read "VERY FINE airspace disease"   Electronically Signed   By: Suzy Bouchard M.D.   On: 06/13/2013 17:45   06/13/2013   CLINICAL DATA:  Abdominal pain, nausea and vomiting  EXAM: CHEST  2 VIEW  COMPARISON:  Radiograph 523 1,015  FINDINGS: Normal cardiac silhouette. Aorta is ectatic. There is fine nodular airspace right lower lobe which is new prior. No focal consolidation. No pleural fluid. Pneumothorax.  IMPRESSION: Verify airspace disease in the right lower lobe suggesting pulmonary edema or infection.  Electronically  Signed: By: Suzy Bouchard M.D. On: 06/13/2013 17:27    Other results: EKG: NSR  Assessment & Plan by Problem: Mr. DONTAVIOUS EMILY is a 68 y.o. male w/ PMHx of HTN, HLD, CAD (STEMI w/ PCI to LM, 2010), AAA (5.8 cm), Diverticulitis, OA, normocytic anemia, and tobacco abuse, admitted for nausea, vomiting, and possible HCAP.  Nausea/Vomiting- Ongoing issue since recent admission, thought to be 2/2 gastroenteritis vs complications of diverticulitis as discussed below. GI was consulted during previous admission, patient controlled on Zofran + Phenergan + Ativan prn for breakthrough nausea, and dilaudid for pain. CT abdomen on 06/05/13 shows descending/sigmoid junction diverticulitis without  Abscess. CTA abdomen/pelvis on 06/07/13 to assess AAA (as discussed below) also showed splenomegaly and cholelithiasis. In ED, patient noted to have no leukocytosis and very mild lactic acid elevation of 2.49. AG of 14. Based on presentation, still likely to be 2/2 gastroenteritis vs diverticulitis, however, given the fact that patient continues to have symptoms, further workup is necessary. Given presence of gall stones on previous CT, still a possibility of cholecystitis, however, patient w/out significant RUQ pain on exam. Another possibility is cyclic vomiting syndrome, however, patient does not fulfill Rome III Criteria for this as he has not had 3 or more discrete episodes of nausea and vomiting in the prior year. Patient may also have a worsening diverticulitis vs colitis, however, this is unlikely given his abdominal exam. IN the setting of AAA, it is very important to control symptoms at this time in order to reduce intraabdominal pressure.  -Admit to med-surg -Repeat CT abdomen to rule out cholecystitis and worsened diverticulitis -IVF's; NS @ 125 ml/hr -Zofran 8 mg q6h scheduled -Phenergan 12.5 q6h prn  -Ativan prn for breakthrough nausea  -Protonix 40 mg po bid per GI recs -Dilaudid 0.5-1.0 q3h  prn  Questionable HCAP- No new complaints of cough since previous admission, however, patient does claims he had a fever of 101.2 at home. CXR in ED shows very fine airspace disease in the right lower lobe suggesting pulmonary edema vs infection. No leukocytosis, lactic acid of 2.49. On CTA abdomen/pelvis during previous admission, suggested a patchy airspace infiltrate in the posterolateral right lower lobe. On pulmonary exam, no obvious coarse breath sounds or crackles. Given recent hospitalization and discharge yesterday (06/12/13) started on Vancomycin + Zosyn in the ED.  -Continue Vanc/Cefepime for now -Repeat CXR in AM. If no sign of pneumonia  on repeat CXR, would d/c ABx. -Blood cultures x2 pending -IVF's as above  Antecubital Fossa Cellulitis- 2/2 peripheral IV during recent admission, discharged on Keflex. Appears almost completely resolved at this time.  -Hold Keflex while treating HCAP as above  AAA- 5.8 cm recently incidentally discovered via CT. CTA chest showed 5.8 cm fusiform infrarenal abdominal aortic aneurysm without evidence of rupture or other complicating features. Vascular surgery consultation was recommended due to increased risk of rupture for AAA >5.5 cm. Patient is currently scheduled for repair on 06/18/2013. Stable at this time.  -Control symptoms of nausea/vomiting -Repeat CT abdomen as above -Continue to monitor  Normocytic Anemia/Thrombocytopenia- CBC on admission w/ Hb of 10.0, improved since recent admission. MCV 89.2. Worked up for hemolytic anemia during recent admission thought to be negative. Platelets 143, increased from 120 on recent discharge.  -Repeat CBC in AM  HTN- Patient normotensive on admission. On Metoprolol 12.5 bid at home.  -Continue Metoprolol  CAD- Myoview on 06/10/13 showed an inferior wall attenuation artifact, otherwise, no perfusion defects and no evidence of stress-induced ischemia. Patient denies active symptoms of chest pain or SOB. EKG  w/out dynamic changes.  -Continue ASA + Lipitor + Metoprolol -NTG prn  HLD- On Rosuvastatin 10 mg qhs at home. Most recent lipid panel shows cholesterol 81, triglycerides 94, HDL 24, LDL 38. -Continue Lipitor  Tobacco use disorder: ~30 pack year history. Likely contributed to AAA above. He has quit successfully in the past for 8 months.  -Nicotine patch prn; withhold for now in the setting of nausea/vomtiing  DVT/PE PPx- Heparin [place]  Dispo: Disposition is deferred at this time, awaiting improvement of current medical problems. Anticipated discharge in approximately 1-2 day(s).   The patient does have a current PCP Sherren Mocha, MD) and does not need an [facility] hospital follow-up appointment after discharge.  The patient does not have transportation limitations that hinder transportation to clinic appointments.  Signed: Corky Sox, MD 06/13/2013, 7:39 PM

## 2013-06-13 NOTE — Progress Notes (Signed)
ANTIBIOTIC CONSULT NOTE - INITIAL  Pharmacy Consult for Vancomycin and Cefepime (for 8 days) Indication: pneumonia  No Known Allergies  Patient Measurements: Height: 5' 1.2" (155.4 cm) Weight: 191 lb 5.8 oz (86.8 kg) IBW/kg (Calculated) : 52.76  Vital Signs: Temp: 98.5 F (36.9 C) (05/31 1907) Temp src: Oral (05/31 1907) BP: 145/72 mmHg (05/31 1907) Pulse Rate: 71 (05/31 1907) Intake/Output from previous day:   Intake/Output from this shift:    Labs:  Recent Labs  06/11/13 0634 06/12/13 0454 06/13/13 1622  WBC 5.7 5.8 6.8  HGB 8.6* 8.4* 10.0*  PLT 121* 120* 143*  CREATININE 1.21 1.14 1.23   Estimated Creatinine Clearance: 55.5 ml/min (by C-G formula based on Cr of 1.23). No results found for this basename: VANCOTROUGH, VANCOPEAK, VANCORANDOM, [place], [place], [place], [place], [place], [place], AMIKACINPEAK, AMIKACINTROU, AMIKACIN,  in the last 72 hours   Microbiology: Recent Results (from the past 720 hour(s))  CULTURE, BLOOD (ROUTINE X 2)     Status: None   Collection Time    06/09/13  8:15 PM      Result Value Ref Range Status   Specimen Description BLOOD RIGHT ARM   Final   Special Requests BOTTLES DRAWN AEROBIC ONLY 5CC   Final   Culture  Setup Time     Final   Value: 06/10/2013 00:43     Performed at [facility]   Culture     Final   Value:        BLOOD CULTURE RECEIVED NO GROWTH TO DATE CULTURE WILL BE HELD FOR 5 DAYS BEFORE ISSUING A FINAL NEGATIVE REPORT     Performed at [facility]   Report Status PENDING   Incomplete  CULTURE, BLOOD (ROUTINE X 2)     Status: None   Collection Time    06/09/13  8:30 PM      Result Value Ref Range Status   Specimen Description BLOOD RIGHT HAND   Final   Special Requests BOTTLES DRAWN AEROBIC ONLY 5CC   Final   Culture  Setup Time     Final   Value: 06/10/2013 00:43     Performed at [facility]   Culture     Final   Value:        BLOOD CULTURE RECEIVED NO GROWTH TO DATE  CULTURE WILL BE HELD FOR 5 DAYS BEFORE ISSUING A FINAL NEGATIVE REPORT     Performed at [facility]   Report Status PENDING   Incomplete  CULTURE, BLOOD (ROUTINE X 2)     Status: None   Collection Time    06/10/13  9:05 PM      Result Value Ref Range Status   Specimen Description BLOOD LEFT HAND   Final   Special Requests     Final   Value: BOTTLES DRAWN AEROBIC AND ANAEROBIC 10CC AER,1CC ANA   Culture  Setup Time     Final   Value: 06/11/2013 00:35     Performed at [facility]   Culture     Final   Value:        BLOOD CULTURE RECEIVED NO GROWTH TO DATE CULTURE WILL BE HELD FOR 5 DAYS BEFORE ISSUING A FINAL NEGATIVE REPORT     Performed at [facility]   Report Status PENDING   Incomplete  CULTURE, BLOOD (ROUTINE X 2)     Status: None   Collection Time    06/10/13  9:21 PM  Result Value Ref Range Status   Specimen Description BLOOD RIGHT HAND   Final   Special Requests     Final   Value: BOTTLES DRAWN AEROBIC AND ANAEROBIC 10CC AER,1CC ANA   Culture  Setup Time     Final   Value: 06/11/2013 00:36     Performed at [facility]   Culture     Final   Value:        BLOOD CULTURE RECEIVED NO GROWTH TO DATE CULTURE WILL BE HELD FOR 5 DAYS BEFORE ISSUING A FINAL NEGATIVE REPORT     Performed at [facility]   Report Status PENDING   Incomplete    Medical History: Past Medical History  Diagnosis Date  . Coronary atherosclerosis of native coronary artery     a. 10/2008 inf STEMI/PCI: LM 50d (IVUS-borderline lesion->med rx), LAD min irregs, LCX 27m, 70d, OM nl, RCA 141m (3.5x28 Vision BMS);  b. 11/2008 Lexiscan MV: EF 65%, no isch/scar.  . Essential hypertension, benign   . Pure hypercholesterolemia   . AAA (abdominal aortic aneurysm)     a. 05/2013 CT: 5.8 cm AAA.  Marland Kitchen Diverticulitis     a. 05/2013 CT: descending/sigmoid jxn w/o abscess.  . Osteoarthritis   . Tobacco abuse     a. ongoing - 1ppd for better part of 50 yrs.  . Normocytic  anemia   . Cellulitis 06/10/2013    Right antecubital fossa at site of IV  03/12/13    Medications:  Prescriptions prior to admission  Medication Sig Dispense Refill  . Acetaminophen (TYLENOL PO) Take 1 tablet by mouth every 6 (six) hours as needed (pain).      . cephALEXin (KEFLEX) 500 MG capsule Take 1 capsule (500 mg total) by mouth every 6 (six) hours.  16 capsule  0  . metoprolol tartrate (LOPRESSOR) 25 MG tablet Take 0.5 tablets (12.5 mg total) by mouth 2 (two) times daily.  90 tablet  3  . nitroGLYCERIN (NITROSTAT) 0.4 MG SL tablet Place 1 tablet (0.4 mg total) under the tongue every 5 (five) minutes as needed.  25 tablet  1  . ondansetron (ZOFRAN) 4 MG tablet Take 1 tablet (4 mg total) by mouth every 8 (eight) hours as needed for nausea or vomiting.  20 tablet  0  . rosuvastatin (CRESTOR) 10 MG tablet Take 10 mg by mouth daily.      Marland Kitchen aspirin 81 MG tablet Take 81 mg by mouth daily.         Assessment: 68 y.o. male presents with fever and vomiting. Just discharged from the hospital 5/30 s/p 7day stay for intractable N/V, AAA, cellulitis. To begin broad spectrum antibiotics (Vancomycin and Cefepime) for PNA. Wbc wnl. Afeb since admit. SCr 1.23, est CrCl 55 ml/min. Received Zosyn 3.375gm IV ~1800 and Vancomycin 1.5gm IV ~1900.   Goal of Therapy:  Vancomycin trough level 15-20 mcg/ml  Plan:  1. Cefepime 1gm IV q8h as ordered x 8 days 2. Vancomycin 750mg  IV q12h x 8 days 3. Will f/u micro data, renal function, pt's clinical condition 4. Vanc trough prn  Sherlon Handing, PharmD, BCPS Clinical pharmacist, pager [phone] 06/13/2013,7:58 PM

## 2013-06-13 NOTE — ED Provider Notes (Signed)
Patient seen/examined in the Emergency Department in conjunction with Resident Physician Provider Fredric Dine Patient reports fever/vomiting Exam : awake/alert.  Crackles in right lung base.  No focal abdominal tenderness Plan: will admit for pneumonia Pt is currently stable without focal abdominal tenderness    Sharyon Cable, MD 06/13/13 [phone]

## 2013-06-14 ENCOUNTER — Inpatient Hospital Stay (HOSPITAL_COMMUNITY): Payer: Medicare Other

## 2013-06-14 DIAGNOSIS — I2510 Atherosclerotic heart disease of native coronary artery without angina pectoris: Secondary | ICD-10-CM

## 2013-06-14 DIAGNOSIS — IMO0002 Reserved for concepts with insufficient information to code with codable children: Secondary | ICD-10-CM

## 2013-06-14 DIAGNOSIS — R197 Diarrhea, unspecified: Secondary | ICD-10-CM

## 2013-06-14 DIAGNOSIS — R918 Other nonspecific abnormal finding of lung field: Secondary | ICD-10-CM

## 2013-06-14 DIAGNOSIS — I10 Essential (primary) hypertension: Secondary | ICD-10-CM

## 2013-06-14 DIAGNOSIS — E785 Hyperlipidemia, unspecified: Secondary | ICD-10-CM

## 2013-06-14 DIAGNOSIS — F17200 Nicotine dependence, unspecified, uncomplicated: Secondary | ICD-10-CM

## 2013-06-14 LAB — CBC
HEMATOCRIT: 27.6 % — AB (ref 39.0–52.0)
Hemoglobin: 8.9 g/dL — ABNORMAL LOW (ref 13.0–17.0)
MCH: 29 pg (ref 26.0–34.0)
MCHC: 32.2 g/dL (ref 30.0–36.0)
MCV: 89.9 fL (ref 78.0–100.0)
Platelets: 139 10*3/uL — ABNORMAL LOW (ref 150–400)
RBC: 3.07 MIL/uL — ABNORMAL LOW (ref 4.22–5.81)
RDW: 20.6 % — AB (ref 11.5–15.5)
WBC: 5.8 10*3/uL (ref 4.0–10.5)

## 2013-06-14 LAB — BASIC METABOLIC PANEL
BUN: 20 mg/dL (ref 6–23)
CO2: 24 meq/L (ref 19–32)
CREATININE: 1.21 mg/dL (ref 0.50–1.35)
Calcium: 8.6 mg/dL (ref 8.4–10.5)
Chloride: 102 mEq/L (ref 96–112)
GFR calc Af Amer: 70 mL/min — ABNORMAL LOW (ref >90)
GFR calc non Af Amer: 61 mL/min — ABNORMAL LOW (ref >90)
Glucose, Bld: 95 mg/dL (ref 70–99)
Potassium: 3.9 mEq/L (ref 3.7–5.3)
Sodium: 139 mEq/L (ref 137–147)

## 2013-06-14 LAB — LEGIONELLA ANTIGEN, URINE: LEGIONELLA ANTIGEN, URINE: NEGATIVE

## 2013-06-14 LAB — CLOSTRIDIUM DIFFICILE BY PCR: CDIFFPCR: NEGATIVE

## 2013-06-14 LAB — LACTIC ACID, PLASMA: LACTIC ACID, VENOUS: 1 mmol/L (ref 0.5–2.2)

## 2013-06-14 MED ORDER — LORAZEPAM 0.5 MG PO TABS
0.5000 mg | ORAL_TABLET | Freq: Four times a day (QID) | ORAL | Status: DC | PRN
  Administered 2013-06-14: 0.5 mg via ORAL
  Filled 2013-06-14: qty 1

## 2013-06-14 MED ORDER — BOOST / RESOURCE BREEZE PO LIQD
1.0000 | Freq: Two times a day (BID) | ORAL | Status: DC
  Administered 2013-06-14: 1 via ORAL

## 2013-06-14 MED ORDER — CEPHALEXIN 500 MG PO CAPS
500.0000 mg | ORAL_CAPSULE | Freq: Four times a day (QID) | ORAL | Status: DC
  Administered 2013-06-14 – 2013-06-15 (×5): 500 mg via ORAL
  Filled 2013-06-14 (×8): qty 1

## 2013-06-14 MED ORDER — PROMETHAZINE HCL 25 MG PO TABS
12.5000 mg | ORAL_TABLET | Freq: Four times a day (QID) | ORAL | Status: DC | PRN
  Filled 2013-06-14: qty 1

## 2013-06-14 MED ORDER — IOHEXOL 300 MG/ML  SOLN
100.0000 mL | Freq: Once | INTRAMUSCULAR | Status: AC | PRN
  Administered 2013-06-14: 100 mL via INTRAVENOUS

## 2013-06-14 NOTE — Progress Notes (Signed)
I have read and agree with this note.   Cathy Caniyah Murley, OTR/L [phone]     

## 2013-06-14 NOTE — H&P (Signed)
Pt seen and examined. Case d/w Dr. Ronnald Ramp. In brief 68 y/o male with PMH of HTN, CAD s/p STEMI with LM PCI, recently diagnosed AAA (5.8 cms), diverticulitis presents with recurrent nausea and vomiting. Pt was recently discharged (5/30) after being admitted for nausea and vomiting. He was treated for possible diverticulitis and dc'd. He was asymptomatic on d/c but returned 1 day later with recurrent symptoms. No CP, no SOB, no lightheadness, no syncope. Pt did complain of fever of 101.2 F at home as well as abd pain- epigastric, crampy, non radiating. Pt also complained of 3 episodes of diarrhea today Remaining ROS negative.  Exam: Lungs- CTA b/l Abd- soft, mild epigastric tenderness +, Bowel sounds normoactive Cardio- regular rate/rhythm, normal heart sounds Ext- no LE edema Gen- AAO*3, NAD  Assessment and Plan: Nausea and vomiting  - Pt with likely gastroenteritis but with elevated LA. Will follow up repeat today to rule out bowel ischemia but unlikely. - C/w zofran- change to prn - D/c dilaudid - abd pain improved and can contriburte to nausea - C diff negative. - Repeat CT showed resolved diverticulitis. No further abx for diverticulitis - c/w protonix  Possible HCAP - Unlikely. Pt with no resp symptoms and no leukocytosis - Repeat CXR with no infiltrate. No further work up for now  Antecubital fossa cellulitis - Complete course of PO keflex. Appears resolved at this time  AAA - patient scheduled for surgery on Friday. Stable on CT done on this admission  CAD/HTN -c/w asa, metoprolol, statin  Case d/w patient and resident in detail

## 2013-06-14 NOTE — Evaluation (Signed)
Physical Therapy Evaluation/ discharge Patient Details Name: [name] MRN: [id number] DOB: [birth year] Today's Date: 06/14/2013   History of Present Illness  [name] is a 68 y.o. male w/ PMHx of HTN, HLD, CAD, AAA (5.8 cm), Diverticulitis, OA, normocytic anemia, and tobacco abuse, presents to the ED w/ complaints of continued nausea/vomiting. The patient was discharged (06/12/13), after being admitted for the same complaints and treated for diverticulitis vs possible gastroenteritis  Clinical Impression  Pt mobilizing well and at his baseline. He takes care of 2.5 acres, garden and a pool and activity is only limited by LLE claudication pain after grossly 20 min in standing he then rests and will walk or work again. Pt encouraged to continue mobility and gait acutely but aware and agreeable to baseline status with no further therapy needs.     Follow Up Recommendations No PT follow up    Equipment Recommendations  None recommended by PT    Recommendations for Other Services       Precautions / Restrictions Precautions Precautions: None      Mobility  Bed Mobility Overal bed mobility: Independent                Transfers Overall transfer level: Independent                  Ambulation/Gait Ambulation/Gait assistance: Independent Ambulation Distance (Feet): 700 Feet Assistive device: None Gait Pattern/deviations: WFL(Within Functional Limits)        Stairs            Wheelchair Mobility    Modified Rankin (Stroke Patients Only)       Balance Overall balance assessment: No apparent balance deficits (not formally assessed)                                           Pertinent Vitals/Pain No pain    Home Living Family/patient expects to be discharged to:: Private residence Living Arrangements: Spouse/significant other Available Help at Discharge: Family;Available 24 hours/day Type of Home: House         Home  Equipment: None      Prior Function Level of Independence: Independent               Hand Dominance        Extremity/Trunk Assessment   Upper Extremity Assessment: Overall WFL for tasks assessed           Lower Extremity Assessment: Overall WFL for tasks assessed      Cervical / Trunk Assessment: Normal  Communication   Communication: No difficulties  Cognition Arousal/Alertness: Awake/alert Behavior During Therapy: WFL for tasks assessed/performed Overall Cognitive Status: Within Functional Limits for tasks assessed                      General Comments      Exercises        Assessment/Plan    PT Assessment Patent does not need any further PT services  PT Diagnosis     PT Problem List    PT Treatment Interventions     PT Goals (Current goals can be found in the Care Plan section) Acute Rehab PT Goals PT Goal Formulation: No goals set, d/c therapy    Frequency     Barriers to discharge        Co-evaluation  End of Session   Activity Tolerance: Patient tolerated treatment well Patient left: in chair;with call bell/phone within reach Nurse Communication: Mobility status         Time: 8921-1941 PT Time Calculation (min): 17 min   Charges:   PT Evaluation $Initial PT Evaluation Tier I: 1 Procedure     PT G Codes:          Deborahann Poteat B Kyria Bumgardner 06/14/2013, 2:41 PM Elwyn Reach, [place]

## 2013-06-14 NOTE — Progress Notes (Signed)
Subjective: Patient feeling much better this morning, no more N/V since yesterday at time of admission. He did have three episodes of watery, nonbloody diarrhea this morning. No abd pain.   Objective: Vital signs in last 24 hours: Filed Vitals:   06/13/13 1745 06/13/13 1907 06/13/13 2249 06/14/13 0555  BP: 130/73 145/72 108/64 127/70  Pulse: 79 71 56 56  Temp:  98.5 F (36.9 C) 98.4 F (36.9 C) 98.2 F (36.8 C)  TempSrc:  Oral Oral Oral  Resp: 17 16 16 16   Height:  5' 1.2" (1.554 m)    Weight:  191 lb 5.8 oz (86.8 kg)    SpO2: 98% 97% 96% 97%   Weight change:   Intake/Output Summary (Last 24 hours) at 06/14/13 0734 Last data filed at 06/14/13 0500  Gross per 24 hour  Intake 1041.67 ml  Output    600 ml  Net 441.67 ml   Physical Exam General: sleeping quietly in bed on my arrival  HEENT: NCAT, vision grossly intact, MMM Neck: supple Lungs: clear to ascultation bilaterally, normal work of respiration Heart: regular rate and rhythm, no murmurs, gallops, or rubs Abdomen: soft, non-tender, non-distended, normal bowel sounds Extremities: warm, no pedal edema Neurologic: alert & oriented X3, cranial nerves II-XII grossly intact, moving all extremities spontaneously  Lab Results: Basic Metabolic Panel:  Recent Labs Lab 06/12/13 0454 06/13/13 1622  NA 138 137  K 3.7 3.8  CL 103 99  CO2 25 24  GLUCOSE 111* 125*  BUN 15 21  CREATININE 1.14 1.23  CALCIUM 8.2* 9.1   Liver Function Tests:  Recent Labs Lab 06/13/13 1622  AST 22  ALT 19  ALKPHOS 64  BILITOT 0.5  PROT 7.8  ALBUMIN 3.9   CBC:  Recent Labs Lab 06/12/13 0454 06/13/13 1622  WBC 5.8 6.8  NEUTROABS  --  5.3  HGB 8.4* 10.0*  HCT 25.8* 29.8*  MCV 89.9 89.2  PLT 120* 143*   Cardiac Enzymes:  Recent Labs Lab 06/08/13 1145  TROPONINI <0.30    Recent Labs Lab 06/07/13 1656  VITAMINB12 330  FOLATE 8.5  FERRITIN 71  TIBC 316  IRON 71  RETICCTPCT 1.8   Urine Drug Screen: Drugs of  Abuse     Component Value Date/Time   LABOPIA NONE DETECTED 06/05/2013 1724   COCAINSCRNUR NONE DETECTED 06/05/2013 1724   LABBENZ NONE DETECTED 06/05/2013 1724   AMPHETMU NONE DETECTED 06/05/2013 1724   THCU NONE DETECTED 06/05/2013 1724   LABBARB NONE DETECTED 06/05/2013 1724    Urinalysis:  Recent Labs Lab 06/13/13 2223  COLORURINE YELLOW  LABSPEC 1.025  PHURINE 6.0  GLUCOSEU NEGATIVE  HGBUR TRACE*  BILIRUBINUR NEGATIVE  KETONESUR NEGATIVE  PROTEINUR 30*  UROBILINOGEN 0.2  NITRITE NEGATIVE  LEUKOCYTESUR SMALL*   Misc. Labs: Lactate 2.49--> repeat pending  Micro Results: Recent Results (from the past 240 hour(s))  CULTURE, BLOOD (ROUTINE X 2)     Status: None   Collection Time    06/09/13  8:15 PM      Result Value Ref Range Status   Specimen Description BLOOD RIGHT ARM   Final   Special Requests BOTTLES DRAWN AEROBIC ONLY 5CC   Final   Culture  Setup Time     Final   Value: 06/10/2013 00:43     Performed at [facility]   Culture     Final   Value:        BLOOD CULTURE RECEIVED NO GROWTH TO DATE CULTURE WILL  BE HELD FOR 5 DAYS BEFORE ISSUING A FINAL NEGATIVE REPORT     Performed at [facility]   Report Status PENDING   Incomplete  CULTURE, BLOOD (ROUTINE X 2)     Status: None   Collection Time    06/09/13  8:30 PM      Result Value Ref Range Status   Specimen Description BLOOD RIGHT HAND   Final   Special Requests BOTTLES DRAWN AEROBIC ONLY 5CC   Final   Culture  Setup Time     Final   Value: 06/10/2013 00:43     Performed at [facility]   Culture     Final   Value:        BLOOD CULTURE RECEIVED NO GROWTH TO DATE CULTURE WILL BE HELD FOR 5 DAYS BEFORE ISSUING A FINAL NEGATIVE REPORT     Performed at [facility]   Report Status PENDING   Incomplete  CULTURE, BLOOD (ROUTINE X 2)     Status: None   Collection Time    06/10/13  9:05 PM      Result Value Ref Range Status   Specimen Description BLOOD LEFT HAND   Final   Special  Requests     Final   Value: BOTTLES DRAWN AEROBIC AND ANAEROBIC 10CC AER,1CC ANA   Culture  Setup Time     Final   Value: 06/11/2013 00:35     Performed at [facility]   Culture     Final   Value:        BLOOD CULTURE RECEIVED NO GROWTH TO DATE CULTURE WILL BE HELD FOR 5 DAYS BEFORE ISSUING A FINAL NEGATIVE REPORT     Performed at [facility]   Report Status PENDING   Incomplete  CULTURE, BLOOD (ROUTINE X 2)     Status: None   Collection Time    06/10/13  9:21 PM      Result Value Ref Range Status   Specimen Description BLOOD RIGHT HAND   Final   Special Requests     Final   Value: BOTTLES DRAWN AEROBIC AND ANAEROBIC 10CC AER,1CC ANA   Culture  Setup Time     Final   Value: 06/11/2013 00:36     Performed at [facility]   Culture     Final   Value:        BLOOD CULTURE RECEIVED NO GROWTH TO DATE CULTURE WILL BE HELD FOR 5 DAYS BEFORE ISSUING A FINAL NEGATIVE REPORT     Performed at [facility]   Report Status PENDING   Incomplete   Studies/Results: Dg Chest 2 View  06/13/2013   ADDENDUM REPORT: 06/13/2013 17:45  ADDENDUM: Voice recognition errror:  The last line of the findings should read " NO pneumothorax."  The first line of the impression should read "VERY FINE airspace disease"   Electronically Signed   By: Suzy Bouchard M.D.   On: 06/13/2013 17:45   06/13/2013   CLINICAL DATA:  Abdominal pain, nausea and vomiting  EXAM: CHEST  2 VIEW  COMPARISON:  Radiograph 523 1,015  FINDINGS: Normal cardiac silhouette. Aorta is ectatic. There is fine nodular airspace right lower lobe which is new prior. No focal consolidation. No pleural fluid. Pneumothorax.  IMPRESSION: Verify airspace disease in the right lower lobe suggesting pulmonary edema or infection.  Electronically Signed: By: Suzy Bouchard M.D. On: 06/13/2013 17:27   Ct Abdomen Pelvis W Contrast  06/14/2013  CLINICAL DATA:  Concern for worsening diverticulitis. Recurrence of abdominal pain with  vomiting and fever  EXAM: CT ABDOMEN AND PELVIS WITH CONTRAST  TECHNIQUE: Multidetector CT imaging of the abdomen and pelvis was performed using the standard protocol following bolus administration of intravenous contrast.  CONTRAST:  162mL OMNIPAQUE IOHEXOL 300 MG/ML  SOLN  COMPARISON:  06/07/2013  FINDINGS: BODY WALL: Unremarkable.  LOWER CHEST: Mild cardiomegaly. Coronary atherosclerosis. Mild dependent atelectasis.  ABDOMEN/PELVIS:  Liver: Few tiny low densities which are nonspecific. No concerning focal abnormality.  Biliary: Cholelithiasis. The gallbladder is distended but unchanged from prior and there is no pericholecystic inflammatory change.  Pancreas: Unremarkable.  Spleen: No focal abnormality.  Adrenals: Unremarkable.  Kidneys and ureters: Small bilateral renal cysts. No evidence of solid mass. No hydronephrosis or urolithiasis.  Bladder: Unremarkable.  Reproductive: Unremarkable.  Bowel: Resolved sigmoid diverticulitis. Diverticulosis is extensive in the sigmoid region. No bowel obstruction. Negative appendix.  Retroperitoneum: No mass or adenopathy.  Peritoneum: No free fluid or gas.  Vascular: Fusiform infrarenal abdominal aortic aneurysm has unchanged maximal diameter of 5.8 cm. There is no interval discontinuity of the atherosclerotic calcification along the outer wall. Unchanged pattern of luminal thrombus. No periaortic hematoma or new contour abnormality along the spine. Vascular surgery consultation has already been recommended.  OSSEOUS: Focally advanced degenerative disc disease at L5-S1.  IMPRESSION: 1. Resolved sigmoid diverticulitis. 2. Unchanged appearance of a 5.8 cm infrarenal abdominal aortic aneurysm. 3. Cholelithiasis.   Electronically Signed   By: Jorje Guild M.D.   On: 06/14/2013 03:21   Medications: I have reviewed the patient's current medications. Scheduled Meds: . aspirin EC  81 mg Oral Daily  . atorvastatin  20 mg Oral q1800  . cephALEXin  500 mg Oral 4 times per day   . heparin  5,000 Units Subcutaneous 3 times per day  . metoprolol tartrate  12.5 mg Oral BID  . nicotine  14 mg Transdermal Daily  . ondansetron (ZOFRAN) IV  8 mg Intravenous 4 times per day  . pantoprazole  40 mg Oral BID  . sodium chloride  3 mL Intravenous Q12H   Continuous Infusions: . sodium chloride 125 mL/hr at 06/14/13 0500   PRN Meds:.HYDROmorphone (DILAUDID) injection, LORazepam, nitroGLYCERIN, promethazine  Assessment/Plan:  Nausea/Vomiting/Diarrhea- Patient's N/V well controlled. No abd pain. Though did develop some watery diarrhea this morning. C diff PCR negative. Repeat abd/pelvis CT scan 6/1 showed resolution of diverticulitis. No need for abx at this time. Sxs may be due to gastroenteritis, though unclear. Though pt has cholelithiasis on CT scan, this is not likely causing his symptoms as he has no pain. Bowel ischemia should be considered given history of CAD/AAA/smoking, though less likely given no abd pain. Lactic acid was elevated to 2.49 on admission, however. Will repeat lactic acid. Abdominal exam is unimpressive. VS remain stable. No leukocytosis. -C diff negative, so discontinue enteric precautions -phenergan PO prn -d/c dilaudid as this can result in N/V and pt not complaining of pain -BCx x 2 pending -repeat lactic acid level -advance to full liquid diet -continue IVF NS @ 125 ml/hr  -Protonix 40 mg po bid per GI recs from last admission  CXR abnormality, initially read as HCAP- Repeat CXR on 6/1 showed complete resolution of what was thought to be PNA on initial CXR. No cough. Afebrile here without leukocytosis. Pt had been started on vanc and cefepime, though these were promptly discontinued after he received only 1 dose.  Antecubital Fossa Cellulitis and likely phlebitis- 2/2  peripheral IV during recent admission, discharged on Keflex. Appears almost completely resolved at this time.  -restarted keflex, plan for 2 more days  AAA-  CTA chest 06/07/13 showed  incidental 5.8 cm fusiform infrarenal abdominal aortic aneurysm without evidence of rupture or other complicating features. Vascular surgery consultation was recommended due to increased risk of rupture for AAA >5.5 cm. Patient is currently scheduled for repair on 06/18/2013.  -Control symptoms of nausea/vomiting to minimize increased intraabdominal pressure  Normocytic Anemia/Thrombocytopenia- CBC on admission w/ Hb of 10.0, improved since recent admission. Today Hb 8.9. MCV 89.2. Worked up for hemolytic anemia during recent admission, though negative. Platelets 139, increased from 120 on recent discharge.  -Repeat CBC in AM   HTN- Patient normotensive on admission. On Metoprolol 12.5 bid at home.  -Continue Metoprolol   CAD- Myoview on 06/10/13 showed an inferior wall attenuation artifact, otherwise, no perfusion defects and no evidence of stress-induced ischemia. Patient denies active symptoms of chest pain or SOB. EKG w/out dynamic changes.  -Continue ASA + Lipitor + Metoprolol  -NTG prn   HLD- On Rosuvastatin 10 mg qhs at home. Most recent lipid panel shows cholesterol 81, triglycerides 94, HDL 24, LDL 38.  -Continue statin  Tobacco use disorder: ~30 pack year history. Likely contributed to AAA above. He has quit successfully in the past for 8 months, though was still smoking up until his last hospital admission last week. -Nicotine patch prn; withhold for now in the setting of nausea/vomtiing   DVT/PE PPx- Heparin [name]  Diet- full liquid diet Code-  full  Dispo: Disposition is deferred at this time, awaiting improvement of current medical problems.  Anticipated discharge in approximately 2-3 day(s).   The patient does have a current PCP Sherren Mocha, MD) and does not need an [facility] hospital follow-up appointment after discharge.  The patient does not have transportation limitations that hinder transportation to clinic appointments.  .Services Needed at time of discharge: Y = Yes, Blank  = No PT:   OT:   RN:   Equipment:   Other:     LOS: 1 day   Rebecca Eaton, MD 06/14/2013, 7:34 AM

## 2013-06-14 NOTE — Progress Notes (Signed)
OT Cancellation Note  Patient Details Name: [name] MRN: [id number] DOB: May 14, 1945   Cancelled Treatment:    Reason Eval/Treat Not Completed: Spoke with Pt and no OT need is identified. OT screened, no needs identified, will sign off.  Lyda Perone 06/14/2013, 2:41 PM

## 2013-06-14 NOTE — Care Management Note (Signed)
    Page 1 of 1   06/18/2013     2:59:52 PM CARE MANAGEMENT NOTE 06/18/2013  Patient:  [name], [name]   Account Number:  [id number]  Date Initiated:  06/14/2013  Documentation initiated by:  Tomi Bamberger  Subjective/Objective Assessment:   dx pna  admit- lives with spouse.     Action/Plan:   pt eval-no pt f/u needed.  plan for surgery.   Anticipated DC Date:  06/19/2013   Anticipated DC Plan:  [place]  CM consult      Choice offered to / List presented to:             Status of service:  In process, will continue to follow Medicare Important Message given?  YES (If response is "NO", the following Medicare IM given date fields will be blank) Date Medicare IM given:  06/16/2013 Date Additional Medicare IM given:    Discharge Disposition:    Per UR Regulation:  Reviewed for med. necessity/level of care/duration of stay  If discussed at [place] of Stay Meetings, dates discussed:    Comments:  06/18/12 Elizabeth BSN [phone] patient with cholithiasis, plan is for surgery.  06/14/13 Maunie, BSN [phone] per physical therapy no f/u therapy needed.

## 2013-06-14 NOTE — Progress Notes (Signed)
INITIAL NUTRITION ASSESSMENT  Pt meets criteria for moderate MALNUTRITION in the context of acute illness as evidenced by <75% estimated energy intake with 5% weight loss in the past month.  DOCUMENTATION CODES Per approved criteria  -Non-severe (moderate) malnutrition in the context of acute illness or injury -Obesity Unspecified   INTERVENTION: - Diet advancement per MD - Resource Breeze BID - Recommend Florastor to help with diarrhea - RD to continue to monitor   NUTRITION DIAGNOSIS: Inadequate oral intake related to clear liquid diet as evidenced by diet order.   Goal: Advance diet as tolerated to regular diet  Monitor:  Weights, labs, diet advancement, diarrhea  Reason for Assessment: Malnutrition screening tool   68 y.o. male  Admitting Dx: HCAP (healthcare-associated pneumonia)  ASSESSMENT: Pt discussed during multidisciplinary rounds. Pt w/ PMHx of HTN, HLD, CAD, AAA (5.8 cm), Diverticulitis, OA, normocytic anemia, and tobacco abuse, presents to the ED w/ complaints of continued nausea/vomiting. The patient was discharged 06/12/13, after being admitted for the same complaints and treated for diverticulitis vs possible gastroenteritis. Patient claims that when he was discharged, his symptoms were completely resolved and he felt well, tolerating po intake. When he got home, he had some food (sausage, egg, coffee) and says he tolerated that well for some time. Later on in the evening, patient started to feel nauseous once again, and had an episode of NBNB emesis w/ associated abdominal cramping and pain. The patient also admitted to one episode of diarrhea yesterday evening as well, but denies blood or mucus. The patient also admits to a fever at home of 101.2F and associated chills.  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 Answered wife's questions about diet therapy for diverticulitis vs diverticulosis    Height: Ht Readings from Last 1 Encounters:  06/13/13 5' 1.2" (1.554 m)    Weight: Wt Readings from Last 1 Encounters:  06/13/13 191 lb 5.8 oz (86.8 kg)    Ideal Body Weight: 112 lbs  % Ideal Body Weight: 170%  Wt Readings from Last 10 Encounters:  06/13/13 191 lb 5.8 oz (86.8 kg)  06/11/13 215 lb 1.6 oz (97.569 kg)  06/05/13 201 lb (91.173 kg)  05/25/13 201 lb 12.8 oz (91.536 kg)  10/13/12 211 lb (95.709 kg)  03/24/12 205 lb 6.4 oz (93.169 kg)  12/25/10 205 lb (92.987 kg)  05/02/09 208 lb (94.348 kg)  01/30/09 203 lb (92.08 kg)  12/20/08 199 lb 4 oz (90.379 kg)    Usual Body Weight: 216 lbs per pt  % Usual Body Weight: 88%  BMI:  Body mass index is 35.94 kg/(m^2). Class II obesity  Estimated Nutritional Needs: Kcal: 1300-1500 Protein: 60-75g Fluid: 1.5L/day  Skin: Intact   Diet Order: Clear Liquid  EDUCATION NEEDS: -No education needs identified at this time   Intake/Output Summary (Last 24 hours) at 06/14/13 1157 Last data filed at  06/14/13 0500  Gross per 24 hour  Intake 1041.67 ml  Output    600 ml  Net 441.67 ml    Last BM: 6/1 per pt  Labs:   Recent Labs Lab 06/12/13 0454 06/13/13 1622 06/14/13 0654  NA 138 137 139  K 3.7 3.8 3.9  CL 103 99 102  CO2 25 24 24   BUN 15 21 20   CREATININE 1.14 1.23 1.21  CALCIUM 8.2* 9.1 8.6  GLUCOSE 111* 125* 95    CBG (last 3)  No results found for this  basename: GLUCAP,  in the last 72 hours  Scheduled Meds: . aspirin EC  81 mg Oral Daily  . atorvastatin  20 mg Oral q1800  . cephALEXin  500 mg Oral 4 times per day  . heparin  5,000 Units Subcutaneous 3 times per day  . metoprolol tartrate  12.5 mg Oral BID  . nicotine  14 mg Transdermal Daily  . ondansetron (ZOFRAN) IV  8 mg Intravenous 4 times per day  . pantoprazole  40 mg Oral BID  . sodium chloride  3 mL Intravenous Q12H    Continuous Infusions: . sodium chloride 125 mL/hr at 06/14/13 0500    Past Medical History  Diagnosis Date  . Coronary atherosclerosis of native coronary artery     a. 10/2008 inf STEMI/PCI: LM 50d (IVUS-borderline lesion->med rx), LAD min irregs, LCX 1m, 70d, OM nl, RCA 127m (3.5x28 Vision BMS);  b. 11/2008 Lexiscan MV: EF 65%, no isch/scar.  . Essential hypertension, benign   . Pure hypercholesterolemia   . AAA (abdominal aortic aneurysm)     a. 05/2013 CT: 5.8 cm AAA.  Marland Kitchen Diverticulitis     a. 05/2013 CT: descending/sigmoid jxn w/o abscess.  . Osteoarthritis   . Tobacco abuse     a. ongoing - 1ppd for better part of 50 yrs.  . Normocytic anemia   . Cellulitis 06/10/2013    Right antecubital fossa at site of IV  03/12/13    Past Surgical History  Procedure Laterality Date  . Cardiac stents  [address] [place], [place], [place] Pager [phone] Weekend/After Hours Pager

## 2013-06-15 DIAGNOSIS — R079 Chest pain, unspecified: Secondary | ICD-10-CM

## 2013-06-15 LAB — PROTEIN ELECTROPHORESIS, SERUM
ALBUMIN ELP: 51.7 % — AB (ref 55.8–66.1)
ALPHA-1-GLOBULIN: 7.8 % — AB (ref 2.9–4.9)
Alpha-2-Globulin: 13.3 % — ABNORMAL HIGH (ref 7.1–11.8)
BETA 2: 4.7 % (ref 3.2–6.5)
Beta Globulin: 6.8 % (ref 4.7–7.2)
Gamma Globulin: 15.7 % (ref 11.1–18.8)
M-Spike, %: NOT DETECTED g/dL
TOTAL PROTEIN ELP: 5.9 g/dL — AB (ref 6.0–8.3)

## 2013-06-15 LAB — CBC WITH DIFFERENTIAL/PLATELET
BASOS ABS: 0 10*3/uL (ref 0.0–0.1)
BASOS PCT: 1 % (ref 0–1)
Eosinophils Absolute: 0.1 10*3/uL (ref 0.0–0.7)
Eosinophils Relative: 2 % (ref 0–5)
HCT: 24.3 % — ABNORMAL LOW (ref 39.0–52.0)
Hemoglobin: 8.2 g/dL — ABNORMAL LOW (ref 13.0–17.0)
LYMPHS PCT: 23 % (ref 12–46)
Lymphs Abs: 0.9 10*3/uL (ref 0.7–4.0)
MCH: 29.9 pg (ref 26.0–34.0)
MCHC: 33.7 g/dL (ref 30.0–36.0)
MCV: 88.7 fL (ref 78.0–100.0)
MONOS PCT: 13 % — AB (ref 3–12)
Monocytes Absolute: 0.5 10*3/uL (ref 0.1–1.0)
NEUTROS ABS: 2.3 10*3/uL (ref 1.7–7.7)
NEUTROS PCT: 62 % (ref 43–77)
Platelets: 135 10*3/uL — ABNORMAL LOW (ref 150–400)
RBC: 2.74 MIL/uL — ABNORMAL LOW (ref 4.22–5.81)
RDW: 20.6 % — ABNORMAL HIGH (ref 11.5–15.5)
WBC: 3.8 10*3/uL — ABNORMAL LOW (ref 4.0–10.5)

## 2013-06-15 LAB — TROPONIN I: Troponin I: <0.3 ng/mL (ref <0.30)

## 2013-06-15 MED ORDER — PROMETHAZINE HCL 25 MG/ML IJ SOLN
12.5000 mg | Freq: Once | INTRAMUSCULAR | Status: AC
  Administered 2013-06-15: 12.5 mg via INTRAVENOUS
  Filled 2013-06-15: qty 1

## 2013-06-15 MED ORDER — GI COCKTAIL ~~LOC~~
30.0000 mL | Freq: Once | ORAL | Status: DC
  Filled 2013-06-15: qty 30

## 2013-06-15 MED ORDER — ONDANSETRON HCL 4 MG/2ML IJ SOLN: 8.0000 mg | Freq: Four times a day (QID) | INTRAMUSCULAR | Status: DC | PRN

## 2013-06-15 MED ORDER — HYDROMORPHONE HCL PF 1 MG/ML IJ SOLN
1.0000 mg | Freq: Once | INTRAMUSCULAR | Status: AC
  Administered 2013-06-15: 1 mg via INTRAVENOUS
  Filled 2013-06-15: qty 1

## 2013-06-15 MED ORDER — NITROGLYCERIN 0.4 MG SL SUBL
SUBLINGUAL_TABLET | SUBLINGUAL | Status: AC
  Administered 2013-06-15: 0.4 mg
  Filled 2013-06-15: qty 1

## 2013-06-15 MED ORDER — ONDANSETRON HCL 4 MG/2ML IJ SOLN
4.0000 mg | Freq: Four times a day (QID) | INTRAMUSCULAR | Status: DC | PRN
  Filled 2013-06-15: qty 2

## 2013-06-15 MED ORDER — ONDANSETRON 8 MG/NS 50 ML IVPB
8.0000 mg | Freq: Four times a day (QID) | INTRAVENOUS | Status: DC | PRN
  Administered 2013-06-16: 8 mg via INTRAVENOUS
  Filled 2013-06-15 (×2): qty 8

## 2013-06-15 NOTE — Progress Notes (Signed)
Pt seen and examined with dr. Mechele Claude. Pt c/o CP and recurrent nausea, vomiting. Cp was midsternal and epigastric and started after the recurrent nausea and vomiting. Initially 10/10 and then improved. Pt told resident that he notices recurrence approx 1 hour after keflex   Exam:  Lungs- CTA b/l  Abd- soft, epigastric tenderness +, Bowel sounds normoactive, non distended  Cardio- regular rate/rhythm, normal heart sounds  Ext- no LE edema  Gen- AAO*3, NAD   Assessment and Plan:   Nausea and vomiting  - Lactic acidosis resolved.  - nausea/vomiting possibly secondary to Keflex. Will d/c and monitor  - C/w zofran prn  - C diff negative.  - Repeat CT showed resolved diverticulitis. No further abx for now - c/w protonix   Chest pain - EKG - NSR with no acute ST/T wave changes - troponins negative *1 set. Will follow up remaining sets - CP likely secondary to nausea/vomiting but given history of CAD will rule out cardiac etiology  Possible HCAP  - Unlikely. Pt with no resp symptoms and no leukocytosis  - Repeat CXR with no infiltrate. No further work up for now   Antecubital fossa cellulitis  - Appears resolved. Keflex dc'd as may be contributing to symptoms  AAA  - patient scheduled for surgery on Friday. Stable on CT done on this admission   CAD/HTN  -c/w asa, metoprolol, statin   Case d/w patient and resident in detail

## 2013-06-15 NOTE — Progress Notes (Signed)
Patient with sever nausea and vomiting this morning. Patient complaining of Chest pain 10/10 mid sternal, diaphoretic.  12 lead EKG done, SR.  Placed on 2L [place].  BP 145/56  SR 60s, RR 20.  1 sl NTG given. MD at bedside. Patient now co chest pain 5, and describes it more epigastric and burning.  Patient again began violently vomiting.  RN gave patient zofran IV, awaiting GI cocktail.  Awaiting lab for blood draw.  Patient now sitting on the side of the bed, denies chest pain but continues to complain of "burning" and "muscle spasms".  RN to call if assistance needed.

## 2013-06-15 NOTE — Progress Notes (Signed)
Patient without any complaints of nausea or vomiting this shift. Will continue to monitor.

## 2013-06-15 NOTE — Progress Notes (Signed)
Subjective: Patient did well last night with mild nausea around midnight after receiving keflex. Pt had been tolerating HH diet well yesterday, but this morning at around 630AM (after receiving keflex) pt began having N/V again. Pt also had some chest tightness w/ associated diaphoresis during episode of retching this morning. EKG wnl. Ordered troponin and GI cocktail. Still having 3-4 episodes of loose stools per day. Wife and patient believe pt's symptoms are related to his antibiotic.  Objective: Vital signs in last 24 hours: Filed Vitals:   06/14/13 1334 06/14/13 2142 06/15/13 0512 06/15/13 1100  BP: 102/59 134/81 125/69 128/76  Pulse: 56 65 61   Temp: 98.3 F (36.8 C) 98.8 F (37.1 C) 98.9 F (37.2 C)   TempSrc: Oral Oral Oral   Resp: 16 16 16    Height:      Weight:      SpO2: 98% 98% 94%    Weight change:   Intake/Output Summary (Last 24 hours) at 06/15/13 1306 Last data filed at 06/15/13 0900  Gross per 24 hour  Intake    480 ml  Output   1200 ml  Net   -720 ml   Physical Exam  General: tired appearing, lying in bed with wet cloth on forehead; became nauseated during my exam HEENT: NCAT, vision grossly intact, MMM Neck: supple Lungs: clear to ascultation bilaterally, normal work of respiration; chest is TTP centrally Heart: regular rate and rhythm, no murmurs, gallops, or rubs Abdomen: soft, some epigastric tenderness, non-distended, normal bowel sounds; no rebound or guarding Extremities: warm, no pedal edema Neurologic: alert & oriented X3, cranial nerves II-XII grossly intact, moving all extremities spontaneously  Lab Results: Basic Metabolic Panel:  Recent Labs Lab 06/13/13 1622 06/14/13 0654  NA 137 139  K 3.8 3.9  CL 99 102  CO2 24 24  GLUCOSE 125* 95  BUN 21 20  CREATININE 1.23 1.21  CALCIUM 9.1 8.6   Liver Function Tests:  Recent Labs Lab 06/13/13 1622  AST 22  ALT 19  ALKPHOS 64  BILITOT 0.5  PROT 7.8  ALBUMIN 3.9   CBC:  Recent  Labs Lab 06/13/13 1622 06/14/13 0654 06/15/13 0625  WBC 6.8 5.8 3.8*  NEUTROABS 5.3  --  2.3  HGB 10.0* 8.9* 8.2*  HCT 29.8* 27.6* 24.3*  MCV 89.2 89.9 88.7  PLT 143* 139* 135*   Cardiac Enzymes:  Recent Labs Lab 06/15/13 1207  TROPONINI <0.30   Urine Drug Screen: Drugs of Abuse     Component Value Date/Time   LABOPIA NONE DETECTED 06/05/2013 1724   COCAINSCRNUR NONE DETECTED 06/05/2013 1724   LABBENZ NONE DETECTED 06/05/2013 1724   AMPHETMU NONE DETECTED 06/05/2013 1724   THCU NONE DETECTED 06/05/2013 1724   LABBARB NONE DETECTED 06/05/2013 1724    Urinalysis:  Recent Labs Lab 06/13/13 2223  COLORURINE YELLOW  LABSPEC 1.025  PHURINE 6.0  GLUCOSEU NEGATIVE  HGBUR TRACE*  BILIRUBINUR NEGATIVE  KETONESUR NEGATIVE  PROTEINUR 30*  UROBILINOGEN 0.2  NITRITE NEGATIVE  LEUKOCYTESUR SMALL*   Misc. Labs: Lactate 2.49--> 1.0  Micro Results: Recent Results (from the past 240 hour(s))  CULTURE, BLOOD (ROUTINE X 2)     Status: None   Collection Time    06/09/13  8:15 PM      Result Value Ref Range Status   Specimen Description BLOOD RIGHT ARM   Final   Special Requests BOTTLES DRAWN AEROBIC ONLY 5CC   Final   Culture  Setup Time  Final   Value: 06/10/2013 00:43     Performed at [facility]   Culture     Final   Value:        BLOOD CULTURE RECEIVED NO GROWTH TO DATE CULTURE WILL BE HELD FOR 5 DAYS BEFORE ISSUING A FINAL NEGATIVE REPORT     Performed at [facility]   Report Status PENDING   Incomplete  CULTURE, BLOOD (ROUTINE X 2)     Status: None   Collection Time    06/09/13  8:30 PM      Result Value Ref Range Status   Specimen Description BLOOD RIGHT HAND   Final   Special Requests BOTTLES DRAWN AEROBIC ONLY 5CC   Final   Culture  Setup Time     Final   Value: 06/10/2013 00:43     Performed at [facility]   Culture     Final   Value:        BLOOD CULTURE RECEIVED NO GROWTH TO DATE CULTURE WILL BE HELD FOR 5 DAYS BEFORE ISSUING A  FINAL NEGATIVE REPORT     Performed at [facility]   Report Status PENDING   Incomplete  CULTURE, BLOOD (ROUTINE X 2)     Status: None   Collection Time    06/10/13  9:05 PM      Result Value Ref Range Status   Specimen Description BLOOD LEFT HAND   Final   Special Requests     Final   Value: BOTTLES DRAWN AEROBIC AND ANAEROBIC 10CC AER,1CC ANA   Culture  Setup Time     Final   Value: 06/11/2013 00:35     Performed at [facility]   Culture     Final   Value:        BLOOD CULTURE RECEIVED NO GROWTH TO DATE CULTURE WILL BE HELD FOR 5 DAYS BEFORE ISSUING A FINAL NEGATIVE REPORT     Performed at [facility]   Report Status PENDING   Incomplete  CULTURE, BLOOD (ROUTINE X 2)     Status: None   Collection Time    06/10/13  9:21 PM      Result Value Ref Range Status   Specimen Description BLOOD RIGHT HAND   Final   Special Requests     Final   Value: BOTTLES DRAWN AEROBIC AND ANAEROBIC 10CC AER,1CC ANA   Culture  Setup Time     Final   Value: 06/11/2013 00:36     Performed at [facility]   Culture     Final   Value:        BLOOD CULTURE RECEIVED NO GROWTH TO DATE CULTURE WILL BE HELD FOR 5 DAYS BEFORE ISSUING A FINAL NEGATIVE REPORT     Performed at [facility]   Report Status PENDING   Incomplete  CULTURE, BLOOD (ROUTINE X 2)     Status: None   Collection Time    06/13/13  5:52 PM      Result Value Ref Range Status   Specimen Description BLOOD RIGHT ARM   Final   Special Requests BOTTLES DRAWN AEROBIC AND ANAEROBIC 10CC EACH   Final   Culture  Setup Time     Final   Value: 06/14/2013 01:15     Performed at [facility]   Culture     Final   Value:        BLOOD CULTURE RECEIVED NO GROWTH TO DATE CULTURE  WILL BE HELD FOR 5 DAYS BEFORE ISSUING A FINAL NEGATIVE REPORT     Performed at [facility]   Report Status PENDING   Incomplete  CULTURE, BLOOD (ROUTINE X 2)     Status: None   Collection Time    06/13/13  6:05 PM       Result Value Ref Range Status   Specimen Description BLOOD RIGHT HAND   Final   Special Requests BOTTLES DRAWN AEROBIC AND ANAEROBIC 10CC EACH   Final   Culture  Setup Time     Final   Value: 06/14/2013 01:15     Performed at [facility]   Culture     Final   Value:        BLOOD CULTURE RECEIVED NO GROWTH TO DATE CULTURE WILL BE HELD FOR 5 DAYS BEFORE ISSUING A FINAL NEGATIVE REPORT     Performed at [facility]   Report Status PENDING   Incomplete  CLOSTRIDIUM DIFFICILE BY PCR     Status: None   Collection Time    06/14/13 11:55 AM      Result Value Ref Range Status   C difficile by pcr NEGATIVE  NEGATIVE Final   Studies/Results: Dg Chest 2 View  06/14/2013   CLINICAL DATA:  Short of breath  EXAM: CHEST  2 VIEW  COMPARISON:  06/13/2013  FINDINGS: Hyperaeration. Mild cardiomegaly. Linear atelectasis or scar the right days. Interstitial prominence is stable. No pleural effusion. No pneumothorax.  IMPRESSION: Cardiomegaly without edema.   Electronically Signed   By: Maryclare Bean M.D.   On: 06/14/2013 07:55   Dg Chest 2 View  06/13/2013   ADDENDUM REPORT: 06/13/2013 17:45  ADDENDUM: Voice recognition errror:  The last line of the findings should read " NO pneumothorax."  The first line of the impression should read "VERY FINE airspace disease"   Electronically Signed   By: Suzy Bouchard M.D.   On: 06/13/2013 17:45   06/13/2013   CLINICAL DATA:  Abdominal pain, nausea and vomiting  EXAM: CHEST  2 VIEW  COMPARISON:  Radiograph 523 1,015  FINDINGS: Normal cardiac silhouette. Aorta is ectatic. There is fine nodular airspace right lower lobe which is new prior. No focal consolidation. No pleural fluid. Pneumothorax.  IMPRESSION: Verify airspace disease in the right lower lobe suggesting pulmonary edema or infection.  Electronically Signed: By: Suzy Bouchard M.D. On: 06/13/2013 17:27   Ct Abdomen Pelvis W Contrast  06/14/2013   CLINICAL DATA:  Concern for worsening diverticulitis.  Recurrence of abdominal pain with vomiting and fever  EXAM: CT ABDOMEN AND PELVIS WITH CONTRAST  TECHNIQUE: Multidetector CT imaging of the abdomen and pelvis was performed using the standard protocol following bolus administration of intravenous contrast.  CONTRAST:  126mL OMNIPAQUE IOHEXOL 300 MG/ML  SOLN  COMPARISON:  06/07/2013  FINDINGS: BODY WALL: Unremarkable.  LOWER CHEST: Mild cardiomegaly. Coronary atherosclerosis. Mild dependent atelectasis.  ABDOMEN/PELVIS:  Liver: Few tiny low densities which are nonspecific. No concerning focal abnormality.  Biliary: Cholelithiasis. The gallbladder is distended but unchanged from prior and there is no pericholecystic inflammatory change.  Pancreas: Unremarkable.  Spleen: No focal abnormality.  Adrenals: Unremarkable.  Kidneys and ureters: Small bilateral renal cysts. No evidence of solid mass. No hydronephrosis or urolithiasis.  Bladder: Unremarkable.  Reproductive: Unremarkable.  Bowel: Resolved sigmoid diverticulitis. Diverticulosis is extensive in the sigmoid region. No bowel obstruction. Negative appendix.  Retroperitoneum: No mass or adenopathy.  Peritoneum: No free fluid or gas.  Vascular:  Fusiform infrarenal abdominal aortic aneurysm has unchanged maximal diameter of 5.8 cm. There is no interval discontinuity of the atherosclerotic calcification along the outer wall. Unchanged pattern of luminal thrombus. No periaortic hematoma or new contour abnormality along the spine. Vascular surgery consultation has already been recommended.  OSSEOUS: Focally advanced degenerative disc disease at L5-S1.  IMPRESSION: 1. Resolved sigmoid diverticulitis. 2. Unchanged appearance of a 5.8 cm infrarenal abdominal aortic aneurysm. 3. Cholelithiasis.   Electronically Signed   By: Jorje Guild M.D.   On: 06/14/2013 03:21   Medications: I have reviewed the patient's current medications. Scheduled Meds: . aspirin EC  81 mg Oral Daily  . atorvastatin  20 mg Oral q1800  .  feeding supplement (RESOURCE BREEZE)  1 Container Oral BID BM  . gi cocktail  30 mL Oral Once  . heparin  5,000 Units Subcutaneous 3 times per day  .  HYDROmorphone (DILAUDID) injection  1 mg Intravenous Once  . metoprolol tartrate  12.5 mg Oral BID  . nicotine  14 mg Transdermal Daily  . pantoprazole  40 mg Oral BID  . sodium chloride  3 mL Intravenous Q12H   Continuous Infusions:   PRN Meds:.LORazepam, nitroGLYCERIN, ondansetron  Assessment/Plan:  Nausea/Vomiting/Diarrhea- Persistent N/V/D. Perhaps related to keflex as timing of symptoms coincide with keflex administration. Pt received phenergan and zofran today without relief. Pt and wife are adamant that dilaudid is the only thing that works. Pt unable to take the GI cocktail at this time given continued N/V. Will give one time dose of dilaudid to help patient sleep though I do not believe this will necessarily help the N/V symptoms specifically. Of note lactic acid level normalized. VS remain stable. No leukocytosis.  -dilaudid 1mg  IV x 1 dose (otherwise will plan to avoid narcotics) -GI cocktail when tolerating PO - HH diet when tolerating PO again - BCx NGTD -IVF stopped, though will consider adding back if continues with N/V -Protonix 40 mg po bid per GI recs from last admission -zofran prn  Chest pain- Pt with sharp/cramping chest tightness this morning while retching. He does have a hx of CAD with prior MI in 2010. No SOB, though he was diaphoretic. Given his hx of CAD, obtained EKG and cycling troponins. First troponin negative. EGK wnl. Suspect patient strained a muscle. Mallory weiss tear possible, though nonbloody emesis. Doubt related to AAA as BP equal in both arms, pt not tachycardic,  -cycle CE (first negative) -EKG wnl  -obtain better control of symptoms  Antecubital Fossa Cellulitis and likely phlebitis- Suspect that N/V/D may be related to keflex. Will discontinue keflex today as pt to receive only three more doses  anyway and his phlebitis appears to be resolved. -discontinue keflex today (received total of 4 days)  AAA-  CTA chest 06/07/13 showed incidental 5.8 cm fusiform infrarenal abdominal aortic aneurysm without evidence of rupture or other complicating features. Vascular surgery consultation was recommended due to increased risk of rupture for AAA >5.5 cm. Patient is currently scheduled for repair on 06/18/2013.  -Control symptoms of nausea/vomiting to minimize increased intraabdominal pressure  Normocytic Anemia/Thrombocytopenia- CBC on admission w/ Hb of 10.0, improved since recent admission. Today Hb 8.2. MCV normocytic. Worked up for hemolytic anemia during recent admission, though negative. Platelets 139, increased from 120 on recent discharge.  -Repeat CBC in AM   HTN- Patient normotensive on admission. On Metoprolol 12.5 bid at home.  -Continue Metoprolol   CAD- Myoview on 06/10/13 showed an inferior wall attenuation artifact, otherwise,  no perfusion defects and no evidence of stress-induced ischemia. Pt with some chest pain today, doubt cardiac related. EKG wnl. Cycling CE. -cycling CE -Continue ASA + Lipitor + Metoprolol  -NTG prn   HLD- On Rosuvastatin 10 mg qhs at home. Most recent lipid panel shows cholesterol 81, triglycerides 94, HDL 24, LDL 38.  -Continue statin  Tobacco use disorder: ~30 pack year history. Likely contributed to AAA above. He has quit successfully in the past for 8 months, though was still smoking up until his last hospital admission last week. -Nicotine patch prn; withhold for now in the setting of nausea/vomtiing   DVT/PE PPx- Heparin [place]  Diet- HH  Code-  full  Dispo: Disposition is deferred at this time, awaiting improvement of current medical problems.  Anticipated discharge in approximately 1-2 day(s).   The patient does have a current PCP Sherren Mocha, MD) and does not need an [facility] hospital follow-up appointment after discharge.  The patient does not  have transportation limitations that hinder transportation to clinic appointments.  .Services Needed at time of discharge: Y = Yes, Blank = No PT:   OT:   RN:   Equipment:   Other:     LOS: 2 days   Rebecca Eaton, MD 06/15/2013, 1:06 PM

## 2013-06-16 LAB — BASIC METABOLIC PANEL
BUN: 14 mg/dL (ref 6–23)
CALCIUM: 9.1 mg/dL (ref 8.4–10.5)
CO2: 25 mEq/L (ref 19–32)
Chloride: 103 mEq/L (ref 96–112)
Creatinine, Ser: 1.08 mg/dL (ref 0.50–1.35)
GFR, EST AFRICAN AMERICAN: 81 mL/min — AB (ref >90)
GFR, EST NON AFRICAN AMERICAN: 70 mL/min — AB (ref >90)
Glucose, Bld: 114 mg/dL — ABNORMAL HIGH (ref 70–99)
POTASSIUM: 3.6 meq/L — AB (ref 3.7–5.3)
Sodium: 141 mEq/L (ref 137–147)

## 2013-06-16 LAB — CBC
HCT: 24.8 % — ABNORMAL LOW (ref 39.0–52.0)
HEMOGLOBIN: 8.1 g/dL — AB (ref 13.0–17.0)
MCH: 28.9 pg (ref 26.0–34.0)
MCHC: 32.7 g/dL (ref 30.0–36.0)
MCV: 88.6 fL (ref 78.0–100.0)
PLATELETS: 139 10*3/uL — AB (ref 150–400)
RBC: 2.8 MIL/uL — ABNORMAL LOW (ref 4.22–5.81)
RDW: 19.9 % — ABNORMAL HIGH (ref 11.5–15.5)
WBC: 3.1 10*3/uL — AB (ref 4.0–10.5)

## 2013-06-16 LAB — CULTURE, BLOOD (ROUTINE X 2)
Culture: NO GROWTH
Culture: NO GROWTH

## 2013-06-16 MED ORDER — POTASSIUM CHLORIDE CRYS ER 20 MEQ PO TBCR
40.0000 meq | EXTENDED_RELEASE_TABLET | Freq: Two times a day (BID) | ORAL | Status: DC
  Administered 2013-06-16 – 2013-06-20 (×6): 40 meq via ORAL
  Administered 2013-06-20: 10 meq via ORAL
  Filled 2013-06-16 (×13): qty 2

## 2013-06-16 MED ORDER — ONDANSETRON HCL 4 MG/2ML IJ SOLN
4.0000 mg | Freq: Four times a day (QID) | INTRAMUSCULAR | Status: DC | PRN
  Administered 2013-06-16 – 2013-06-17 (×2): 4 mg via INTRAVENOUS
  Filled 2013-06-16 (×4): qty 2

## 2013-06-16 NOTE — Progress Notes (Signed)
Note entered in error

## 2013-06-16 NOTE — Progress Notes (Signed)
Subjective: Patient with some N/V during the night, last episode around 4am this morning, no nausea since. Diarrhea has resolved. Patient did not take any antiemetic overnight because he "didn't want anything." No CP or SOB.  Objective: Vital signs in last 24 hours: Filed Vitals:   06/15/13 1326 06/15/13 2021 06/15/13 2237 06/16/13 0431  BP: 119/74 109/64 158/80 125/74  Pulse: 71 57 69 64  Temp: 97.5 F (36.4 C) 98.9 F (37.2 C)  98.2 F (36.8 C)  TempSrc: Oral Oral  Oral  Resp: 18 18  18   Height:      Weight:      SpO2: 100% 100%  94%   Weight change:   Intake/Output Summary (Last 24 hours) at 06/16/13 1038 Last data filed at 06/16/13 0530  Gross per 24 hour  Intake      0 ml  Output    150 ml  Net   -150 ml   Physical Exam  General: lying in bed sleeping; comfortably appearing HEENT: NCAT, vision grossly intact, MMM Neck: supple Lungs: clear to ascultation bilaterally, normal work of respiration Heart: regular rate and rhythm, no murmurs, gallops, or rubs Abdomen: soft, nontender, normal BS Extremities: warm, no pedal edema Neurologic: alert & oriented X3, cranial nerves II-XII grossly intact, moving all extremities spontaneously  Lab Results: Basic Metabolic Panel:  Recent Labs Lab 06/14/13 0654 06/16/13 0519  NA 139 141  K 3.9 3.6*  CL 102 103  CO2 24 25  GLUCOSE 95 114*  BUN 20 14  CREATININE 1.21 1.08  CALCIUM 8.6 9.1   Liver Function Tests:  Recent Labs Lab 06/13/13 1622  AST 22  ALT 19  ALKPHOS 64  BILITOT 0.5  PROT 7.8  ALBUMIN 3.9   CBC:  Recent Labs Lab 06/13/13 1622  06/15/13 0625 06/16/13 0519  WBC 6.8  < > 3.8* 3.1*  NEUTROABS 5.3  --  2.3  --   HGB 10.0*  < > 8.2* 8.1*  HCT 29.8*  < > 24.3* 24.8*  MCV 89.2  < > 88.7 88.6  PLT 143*  < > 135* 139*  < > = values in this interval not displayed. Cardiac Enzymes:  Recent Labs Lab 06/15/13 1207 06/15/13 1711 06/15/13 2248  TROPONINI <0.30 <0.30 <0.30   Urine Drug  Screen: Drugs of Abuse     Component Value Date/Time   LABOPIA NONE DETECTED 06/05/2013 1724   COCAINSCRNUR NONE DETECTED 06/05/2013 1724   LABBENZ NONE DETECTED 06/05/2013 1724   AMPHETMU NONE DETECTED 06/05/2013 1724   THCU NONE DETECTED 06/05/2013 1724   LABBARB NONE DETECTED 06/05/2013 1724    Urinalysis:  Recent Labs Lab 06/13/13 2223  COLORURINE YELLOW  LABSPEC 1.025  PHURINE 6.0  GLUCOSEU NEGATIVE  HGBUR TRACE*  BILIRUBINUR NEGATIVE  KETONESUR NEGATIVE  PROTEINUR 30*  UROBILINOGEN 0.2  NITRITE NEGATIVE  LEUKOCYTESUR SMALL*   Misc. Labs: Lactate 2.49--> 1.0  Micro Results: Recent Results (from the past 240 hour(s))  CULTURE, BLOOD (ROUTINE X 2)     Status: None   Collection Time    06/09/13  8:15 PM      Result Value Ref Range Status   Specimen Description BLOOD RIGHT ARM   Final   Special Requests BOTTLES DRAWN AEROBIC ONLY 5CC   Final   Culture  Setup Time     Final   Value: 06/10/2013 00:43     Performed at [facility]   Culture     Final   Value: NO  GROWTH 5 DAYS     Performed at [facility]   Report Status 06/16/2013 FINAL   Final  CULTURE, BLOOD (ROUTINE X 2)     Status: None   Collection Time    06/09/13  8:30 PM      Result Value Ref Range Status   Specimen Description BLOOD RIGHT HAND   Final   Special Requests BOTTLES DRAWN AEROBIC ONLY 5CC   Final   Culture  Setup Time     Final   Value: 06/10/2013 00:43     Performed at [facility]   Culture     Final   Value: NO GROWTH 5 DAYS     Performed at [facility]   Report Status 06/16/2013 FINAL   Final  CULTURE, BLOOD (ROUTINE X 2)     Status: None   Collection Time    06/10/13  9:05 PM      Result Value Ref Range Status   Specimen Description BLOOD LEFT HAND   Final   Special Requests     Final   Value: BOTTLES DRAWN AEROBIC AND ANAEROBIC 10CC AER,1CC ANA   Culture  Setup Time     Final   Value: 06/11/2013 00:35     Performed at [facility]    Culture     Final   Value:        BLOOD CULTURE RECEIVED NO GROWTH TO DATE CULTURE WILL BE HELD FOR 5 DAYS BEFORE ISSUING A FINAL NEGATIVE REPORT     Performed at [facility]   Report Status PENDING   Incomplete  CULTURE, BLOOD (ROUTINE X 2)     Status: None   Collection Time    06/10/13  9:21 PM      Result Value Ref Range Status   Specimen Description BLOOD RIGHT HAND   Final   Special Requests     Final   Value: BOTTLES DRAWN AEROBIC AND ANAEROBIC 10CC AER,1CC ANA   Culture  Setup Time     Final   Value: 06/11/2013 00:36     Performed at [facility]   Culture     Final   Value:        BLOOD CULTURE RECEIVED NO GROWTH TO DATE CULTURE WILL BE HELD FOR 5 DAYS BEFORE ISSUING A FINAL NEGATIVE REPORT     Performed at [facility]   Report Status PENDING   Incomplete  CULTURE, BLOOD (ROUTINE X 2)     Status: None   Collection Time    06/13/13  5:52 PM      Result Value Ref Range Status   Specimen Description BLOOD RIGHT ARM   Final   Special Requests BOTTLES DRAWN AEROBIC AND ANAEROBIC 10CC EACH   Final   Culture  Setup Time     Final   Value: 06/14/2013 01:15     Performed at [facility]   Culture     Final   Value:        BLOOD CULTURE RECEIVED NO GROWTH TO DATE CULTURE WILL BE HELD FOR 5 DAYS BEFORE ISSUING A FINAL NEGATIVE REPORT     Performed at [facility]   Report Status PENDING   Incomplete  CULTURE, BLOOD (ROUTINE X 2)     Status: None   Collection Time    06/13/13  6:05 PM      Result Value Ref Range Status   Specimen Description BLOOD RIGHT HAND  Final   Special Requests BOTTLES DRAWN AEROBIC AND ANAEROBIC 10CC EACH   Final   Culture  Setup Time     Final   Value: 06/14/2013 01:15     Performed at [facility]   Culture     Final   Value:        BLOOD CULTURE RECEIVED NO GROWTH TO DATE CULTURE WILL BE HELD FOR 5 DAYS BEFORE ISSUING A FINAL NEGATIVE REPORT     Performed at [facility]   Report Status  PENDING   Incomplete  CLOSTRIDIUM DIFFICILE BY PCR     Status: None   Collection Time    06/14/13 11:55 AM      Result Value Ref Range Status   C difficile by pcr NEGATIVE  NEGATIVE Final   Studies/Results: No results found. Medications: I have reviewed the patient's current medications. Scheduled Meds: . aspirin EC  81 mg Oral Daily  . atorvastatin  20 mg Oral q1800  . feeding supplement (RESOURCE BREEZE)  1 Container Oral BID BM  . gi cocktail  30 mL Oral Once  . heparin  5,000 Units Subcutaneous 3 times per day  . metoprolol tartrate  12.5 mg Oral BID  . nicotine  14 mg Transdermal Daily  . pantoprazole  40 mg Oral BID  . potassium chloride  40 mEq Oral BID  . sodium chloride  3 mL Intravenous Q12H   Continuous Infusions:   PRN Meds:.LORazepam, nitroGLYCERIN, ondansetron (ZOFRAN) IV  Assessment/Plan:  Nausea/Vomiting/Diarrhea- Resolving. I suspect symptoms were related to keflex, which has been stopped. We will see how he does with breakfast and if he tolerates PO without developing N/V, likely discharge today. -HH diet when tolerating PO again -BCx NGTD -Protonix 40 mg po bid per GI recs from last admission -zofran prn  Chest pain- Resolved. Likely related to retching. Troponin negative x 3.   Antecubital Fossa Cellulitis and likely phlebitis- Resolved. Received 4 days of keflex total.  AAA-  CTA chest 06/07/13 showed incidental 5.8 cm fusiform infrarenal abdominal aortic aneurysm without evidence of rupture or other complicating features. Vascular surgery consultation was recommended due to increased risk of rupture for AAA >5.5 cm. Patient is currently scheduled for repair on 06/18/2013.  -Control symptoms of nausea/vomiting to minimize increased intraabdominal pressure  Normocytic Anemia/Thrombocytopenia- Hb stable. PLT level stable.  HTN- Patient normotensive on admission. On Metoprolol 12.5 bid at home.  -Continue Metoprolol   CAD- Stable. ACS ruled out overnight.  Myoview on 06/10/13 showed an inferior wall attenuation artifact, otherwise, no perfusion defects and no evidence of stress-induced ischemia. -Continue ASA + Lipitor + Metoprolol  -NTG prn   HLD- On Rosuvastatin 10 mg qhs at home. Most recent lipid panel shows cholesterol 81, triglycerides 94, HDL 24, LDL 38.  -Continue statin  Tobacco use disorder: ~30 pack year history. Likely contributed to AAA above. He has quit successfully in the past for 8 months, though was still smoking up until his last hospital admission last week. -Nicotine patch prn; withhold for now in the setting of nausea/vomtiing   DVT/PE PPx- Heparin Dieterich  Diet- HH  Code-  full  Dispo: Discharge likely today if patient still feeling better.  The patient does have a current PCP Sherren Mocha, MD) and does not need an [facility] hospital follow-up appointment after discharge.  The patient does not have transportation limitations that hinder transportation to clinic appointments.  .Services Needed at time of discharge: Y = Yes, Blank = No PT:  OT:   RN:   Equipment:   Other:     LOS: 3 days   Rebecca Eaton, MD 06/16/2013, 10:38 AM

## 2013-06-16 NOTE — Progress Notes (Signed)
[facility]  MRN #:  [id number]  History of Present Illness: This is a 68 y.o. male well known to Dr. Trula Slade who was recently discharged on 06/12/13. On his previous admission, he complained of nausea, vomiting and abdominal pain. He was treated for diverticulitis versus gastroenteritis. He was found to have a 5.8 cm fusiform infrarenal abdominal aortic aneurysm incidentally on CT exam. It was believed that his symptoms were unrelated to his AAA. He was advised to not seek elective repair of his AAA until his symptoms subsided. He currently has a surgery date of 06/25/13 with Dr. Trula Slade for endovascular aneurysm repair. On this admission (06/13/13), he complains of similar symptoms of abdominal pain, nausea vomiting and diarrhea. His abdominal pain is epigastric in location and crampy in nature. He denies any pain radiation. He describes the pain as worse than his previous admission. Yesterday 06/15/13, he reported having chest pain following an episode of dry heaving. His troponins were negative and EKG normal and were not believed to related to AAA.   On ROS, he denies any melena, hematochezia, fever, chills, or chest pain. He denies any intermittent claudication of his lower extremities and calf pain.  All other systems are negative.   He has hyperlipidemia treated with statin, hypertension treated with a beta blocker, history of myocardial infarction and CAD treated with stenting.   Past Medical History  Diagnosis Date  . Coronary atherosclerosis of native coronary artery     a. 10/2008 inf STEMI/PCI: LM 50d (IVUS-borderline lesion->med rx), LAD min irregs, LCX 25m, 70d, OM nl, RCA 123m (3.5x28 Vision BMS);  b. 11/2008 Lexiscan MV: EF 65%, no isch/scar.  . Essential hypertension, benign   . Pure hypercholesterolemia   . AAA (abdominal aortic aneurysm)     a. 05/2013 CT: 5.8 cm AAA.  Marland Kitchen Diverticulitis     a. 05/2013 CT: descending/sigmoid jxn w/o abscess.  . Osteoarthritis   .  Tobacco abuse     a. ongoing - 1ppd for better part of 50 yrs.  . Normocytic anemia   . Cellulitis 06/10/2013    Right antecubital fossa at site of IV  03/12/13   Past Surgical History  Procedure Laterality Date  . Cardiac stents  2010    No Known Allergies  Prior to Admission medications   Medication Sig Start Date End Date Taking? Authorizing Provider  Acetaminophen (TYLENOL PO) Take 1 tablet by mouth every 6 (six) hours as needed (pain).   Yes Historical Provider, MD  cephALEXin (KEFLEX) 500 MG capsule Take 1 capsule (500 mg total) by mouth every 6 (six) hours. 06/12/13  Yes Clinton Gallant, MD  metoprolol tartrate (LOPRESSOR) 25 MG tablet Take 0.5 tablets (12.5 mg total) by mouth 2 (two) times daily. 10/13/12  Yes Sherren Mocha, MD  nitroGLYCERIN (NITROSTAT) 0.4 MG SL tablet Place 1 tablet (0.4 mg total) under the tongue every 5 (five) minutes as needed. 03/24/12  Yes Sherren Mocha, MD  ondansetron (ZOFRAN) 4 MG tablet Take 1 tablet (4 mg total) by mouth every 8 (eight) hours as needed for nausea or vomiting. 06/12/13  Yes Clinton Gallant, MD  rosuvastatin (CRESTOR) 10 MG tablet Take 10 mg by mouth daily.   Yes Historical Provider, MD  aspirin 81 MG tablet Take 81 mg by mouth daily.      Historical Provider, MD    History   Social History  . Marital Status: Married    Spouse Name: N/A    Number of Children: N/A  .  Years of Education: N/A   Occupational History  . Not on file.   Social History Main Topics  . Smoking status: Heavy Tobacco Smoker -- 1.00 packs/day for 50 years    Types: Cigarettes    Last Attempt to Quit: 07/25/2010  . Smokeless tobacco: Never Used  . Alcohol Use: No  . Drug Use: No  . Sexual Activity: Not on file   Other Topics Concern  . Not on file   Social History Narrative   Lives in [place] with wife.  Retired Furniture conservator/restorer.  Works out in the yard often - limited by claudication.  Does not routinely exercise.     Family History  Problem Relation Age of Onset  .  Heart attack Brother   . Cancer Father   . Cancer Mother     ROS: [x]  Positive   [ ]  Negative   [ ]  All sytems reviewed and are negative  Cardiovascular: []  chest pain/pressure []  palpitations []  SOB lying flat []  DOE []  pain in legs while walking []  pain in legs at rest []  pain in legs at night []  non-healing ulcers []  hx of DVT []  swelling in legs  Pulmonary: []  productive cough []  asthma/wheezing []  home O2  Neurologic: []  weakness in []  arms []  legs []  numbness in []  arms []  legs []  hx of CVA []  mini stroke [] difficulty speaking or slurred speech []  temporary loss of vision in one eye []  dizziness  Hematologic: []  hx of cancer []  bleeding problems []  problems with blood clotting easily  Endocrine:   []  diabetes []  thyroid disease  GI []  vomiting blood []  blood in stool  GU: []  CKD/renal failure []  HD--[]  M/W/F or []  T/T/S []  burning with urination []  blood in urine  Psychiatric: []  anxiety []  depression  Musculoskeletal: []  arthritis []  joint pain  Integumentary: []  rashes []  ulcers  Constitutional: []  fever []  chills   Physical Examination  Filed Vitals:   06/16/13 1303  BP: 145/71  Pulse: 59  Temp: 98.2 F (36.8 C)  Resp: 16   Body mass index is 35.94 kg/(m^2).  General:  WDWN in NAD Gait: Not observed HENT: WNL, normocephalic Eyes: Pupils equal Pulmonary: normal non-labored breathing, without Rales, rhonchi,  wheezing Cardiac: regular, without  Murmurs, rubs or gallops; without carotid bruits Abdomen: soft, +BS, no distension, mild tenderness to epigastric area  Skin: without rashes, without ulcers  Vascular Exam/Pulses:  Right Left  Radial 2+ (normal) 2+ (normal)  Ulnar 2+ (normal) 2+ (normal)  Femoral 2+ (normal) 2+ (normal)  Popliteal Not palpable Not palpable  DP 1+ (weak) 2+ (normal)  PT 2+ (normal) 2+ (normal)   Extremities: without ischemic changes, without Gangrene , without cellulitis; without open wounds;    Musculoskeletal: no muscle wasting or atrophy  Neurologic: A&O X 3; Appropriate Affect ; SENSATION: normal; MOTOR FUNCTION:  moving all extremities equally. Speech is fluent/normal   CBC    Component Value Date/Time   WBC 3.1* 06/16/2013 0519   RBC 2.80* 06/16/2013 0519   RBC 2.73* 06/07/2013 1656   HGB 8.1* 06/16/2013 0519   HCT 24.8* 06/16/2013 0519   PLT 139* 06/16/2013 0519   MCV 88.6 06/16/2013 0519   MCH 28.9 06/16/2013 0519   MCHC 32.7 06/16/2013 0519   RDW 19.9* 06/16/2013 0519   LYMPHSABS 0.9 06/15/2013 0625   MONOABS 0.5 06/15/2013 0625   EOSABS 0.1 06/15/2013 0625   BASOSABS 0.0 06/15/2013 0625    BMET    Component Value Date/Time  NA 141 06/16/2013 0519   K 3.6* 06/16/2013 0519   CL 103 06/16/2013 0519   CO2 25 06/16/2013 0519   GLUCOSE 114* 06/16/2013 0519   BUN 14 06/16/2013 0519   CREATININE 1.08 06/16/2013 0519   CALCIUM 9.1 06/16/2013 0519   GFRNONAA 70* 06/16/2013 0519   GFRAA 81* 06/16/2013 0519    COAGS: Lab Results  Component Value Date   INR 1.24 06/05/2013   INR 1.66* 10/20/2008    Statin:  yes Beta Blocker:  yes Aspirin:  yes ACEI:  no ARB:  no Other antiplatelets/anticoagulants:  no   ASSESSMENT: This is a 68 y.o. male with asymptomatic 5.8 cm infrarenal AAA  PLAN: His CT scan from this admission shows a stable, unchanged appearance of his AAA. His current symptoms are not likely related to his AAA. Discussed surgical repair as an outpatient once his current symptoms resolve. Dr. Trula Slade has had extensive discussion with the patient and family about this. We will continue to follow.    Virgina Jock, PA-C [facility] Office: [phone] Pager: [phone]

## 2013-06-16 NOTE — Discharge Summary (Signed)
Name: [name] MRN: [id number] DOB: 1945-11-02 68 y.o. PCP: Sherren Mocha, MD  Date of Admission: 06/13/2013  3:29 PM Date of Discharge: 06/21/2013 Attending Physician: Aldine Contes, MD  Discharge Diagnosis: Principal Problem:   Symptomatic Cholelithiasis   Chronic Cholecystitis  Active Problems:   HYPERLIPIDEMIA   TOBACCO ABUSE   HYPERTENSION, UNSPECIFIED   AAA (abdominal aortic aneurysm) without rupture   Malnutrition of moderate degree  Discharge Medications:   Medication List    STOP taking these medications       cephALEXin 500 MG capsule  Commonly known as:  KEFLEX      TAKE these medications       aspirin 81 MG tablet  Take 81 mg by mouth daily.     metoprolol tartrate 25 MG tablet  Commonly known as:  LOPRESSOR  Take 0.5 tablets (12.5 mg total) by mouth 2 (two) times daily.     nitroGLYCERIN 0.4 MG SL tablet  Commonly known as:  NITROSTAT  Place 1 tablet (0.4 mg total) under the tongue every 5 (five) minutes as needed.     ondansetron 4 MG tablet  Commonly known as:  ZOFRAN  Take 1 tablet (4 mg total) by mouth every 8 (eight) hours as needed for nausea or vomiting.     oxyCODONE 5 MG immediate release tablet  Commonly known as:  Oxy IR/ROXICODONE  Take 1-2 tablets (5-10 mg total) by mouth every 4 (four) hours as needed for moderate pain.     rosuvastatin 10 MG tablet  Commonly known as:  CRESTOR  Take 10 mg by mouth daily.     TYLENOL PO  Take 1 tablet by mouth every 6 (six) hours as needed (pain).        Disposition and follow-up:   [name] was discharged from [facility] in Stable condition.  At the hospital follow up visit please address:  1.  Chronic cholecystitis and cholelithiasis s/p lap cholecystectomy on 06/19/2013- please assess for continued symptom resolution/pain control (given oxycodone rx) and surgical incisions for infection  2. AAA (5.8cm)- pt needs AAA repair, but had been postponed given  acute illness; vascular saw patient before he left the hospital and told pt they would call him with an appointment to be seen in their office; please make sure they have contacted patient  2.  Labs / imaging needed at time of follow-up: none  3.  Pending labs/ test needing follow-up: none   Follow-up Appointments: Follow-up Information   Follow up with Jerene Pitch, MD On 06/30/2013. (9:45am )    Specialty:  Internal Medicine   Contact information:   [place] [address] [phone]       Follow up with [facility], MD. Schedule an appointment as soon as possible for a visit in 2 weeks. (Call for an appointment in 2-3 weeks.)    Specialty:  General Surgery   Contact information:   [address] [place] [address] [phone]       Follow up with Eldridge Abrahams, MD. Schedule an appointment as soon as possible for a visit in 1 week.   Specialty:  Vascular Surgery   Contact information:   [place] [address] [phone]       Discharge Instructions:   Consultations:  Vascular surgery- Dr. Trula Slade GI- Dr. Olevia Perches   Procedures Performed:  Ct Abdomen Pelvis Wo Contrast  06/05/2013   CLINICAL DATA:  Abdominal pain and cramping.  Hematuria.  EXAM: CT ABDOMEN AND PELVIS WITHOUT CONTRAST  TECHNIQUE: Multidetector CT imaging of the abdomen and pelvis was performed following the standard protocol without IV contrast.  COMPARISON:  None.  FINDINGS: Minimal dependent atelectasis in the visualized lung bases. Coronary calcification. Unremarkable liver, spleen, right adrenal gland, pancreas. Unenhanced CT was performed per clinician order. Lack of IV contrast limits sensitivity and specificity, especially for evaluation of abdominal/pelvic solid viscera.  At least 2 subcentimeter partially calcified stones layer in the dependent aspect of the nondilated gallbladder. 15 mm low-attenuation left adrenal nodule, probably benign  adenomas in the absence of history of primary carcinoma. . Small probable cysts in the upper pole left kidney and from the lower pole right kidney. No hydronephrosis.  Fusiform distal aortic aneurysm measuring up to 5.8 cm maximum transverse diameter, tapering to a diameter of 3.6 cm at the bifurcation. Iliac arteries are atheromatous but non aneurysmal. There is no evidence of retroperitoneal hemorrhage nor hyperdense crescent to suggest impending rupture.  Stomach, small bowel, colon are nondilated. Multiple descending and sigmoid diverticula, with some mild inflammatory/edematous changes around the descending/sigmoid junction. No abscess. No free air. No ascites. Urinary bladder physiologically distended. Moderate prostatic enlargement with central coarse calcifications. No adenopathy.  IMPRESSION: 1. Diverticulitis at the descending/sigmoid junction without abscess. 2. 5.8 cm abdominal aortic aneurysm. Vascular surgery consultation recommended due to increased risk of rupture for AAA >5.5 cm. This recommendation follows ACR consensus. This recommendation follows ACR consensus guidelines: White Paper of the ACR Incidental Findings Committee II on Vascular Findings. J Am Coll Radiol 2013; 10:789-794.   Electronically Signed   By: Arne Cleveland M.D.   On: 06/05/2013 12:52   Dg Chest 2 View  06/14/2013   CLINICAL DATA:  Short of breath  EXAM: CHEST  2 VIEW  COMPARISON:  06/13/2013  FINDINGS: Hyperaeration. Mild cardiomegaly. Linear atelectasis or scar the right days. Interstitial prominence is stable. No pleural effusion. No pneumothorax.  IMPRESSION: Cardiomegaly without edema.   Electronically Signed   By: Maryclare Bean M.D.   On: 06/14/2013 07:55   Dg Chest 2 View  06/13/2013   ADDENDUM REPORT: 06/13/2013 17:45  ADDENDUM: Voice recognition errror:  The last line of the findings should read " NO pneumothorax."  The first line of the impression should read "VERY FINE airspace disease"   Electronically Signed    By: Suzy Bouchard M.D.   On: 06/13/2013 17:45   06/13/2013   CLINICAL DATA:  Abdominal pain, nausea and vomiting  EXAM: CHEST  2 VIEW  COMPARISON:  Radiograph 523 1,015  FINDINGS: Normal cardiac silhouette. Aorta is ectatic. There is fine nodular airspace right lower lobe which is new prior. No focal consolidation. No pleural fluid. Pneumothorax.  IMPRESSION: Verify airspace disease in the right lower lobe suggesting pulmonary edema or infection.  Electronically Signed: By: Suzy Bouchard M.D. On: 06/13/2013 17:27    Ct Abdomen Pelvis W Contrast  06/14/2013   CLINICAL DATA:  Concern for worsening diverticulitis. Recurrence of abdominal pain with vomiting and fever  EXAM: CT ABDOMEN AND PELVIS WITH CONTRAST  TECHNIQUE: Multidetector CT imaging of the abdomen and pelvis was performed using the standard protocol following bolus administration of intravenous contrast.  CONTRAST:  139mL OMNIPAQUE IOHEXOL 300 MG/ML  SOLN  COMPARISON:  06/07/2013  FINDINGS: BODY WALL: Unremarkable.  LOWER CHEST: Mild cardiomegaly. Coronary atherosclerosis. Mild dependent atelectasis.  ABDOMEN/PELVIS:  Liver: Few tiny low densities which are nonspecific. No concerning focal abnormality.  Biliary: Cholelithiasis. The gallbladder is distended but unchanged from prior and there is no pericholecystic inflammatory change.  Pancreas: Unremarkable.  Spleen: No focal abnormality.  Adrenals: Unremarkable.  Kidneys and ureters: Small bilateral renal cysts. No evidence of solid mass. No hydronephrosis or urolithiasis.  Bladder: Unremarkable.  Reproductive: Unremarkable.  Bowel: Resolved sigmoid diverticulitis. Diverticulosis is extensive in the sigmoid region. No bowel obstruction. Negative appendix.  Retroperitoneum: No mass or adenopathy.  Peritoneum: No free fluid or gas.  Vascular: Fusiform infrarenal abdominal aortic aneurysm has unchanged maximal diameter of 5.8 cm. There is no interval discontinuity of the atherosclerotic calcification  along the outer wall. Unchanged pattern of luminal thrombus. No periaortic hematoma or new contour abnormality along the spine. Vascular surgery consultation has already been recommended.  OSSEOUS: Focally advanced degenerative disc disease at L5-S1.  IMPRESSION: 1. Resolved sigmoid diverticulitis. 2. Unchanged appearance of a 5.8 cm infrarenal abdominal aortic aneurysm. 3. Cholelithiasis.   Electronically Signed   By: Jorje Guild M.D.   On: 06/14/2013 03:21   Ct Angio Chest Aortic Dissect W &/or W/o  06/07/2013   CLINICAL DATA:  Aortic aneurysm  EXAM: CT ANGIOGRAPHY CHEST, ABDOMEN AND PELVIS  TECHNIQUE: Multidetector CT imaging through the chest, abdomen and pelvis was performed using the standard protocol during bolus administration of intravenous contrast. Multiplanar reconstructed images and MIPs were obtained and reviewed to evaluate the vascular anatomy.  CONTRAST:  112mL OMNIPAQUE IOHEXOL 350 MG/ML SOLN  COMPARISON:  06/05/2013  FINDINGS: CTA CHEST FINDINGS  No precontrast images were done. Patchy coronary, aortic arch, and descending thoracic aortic calcifications. Adequate contrast opacification of the thoracic aorta with no evidence of dissection, aneurysm, or stenosis. There is classic 3-vessel brachiocephalic arch anatomy without proximal stenosis. Good contrast opacification of pulmonary artery branches; the exam was not optimized for detection of pulmonary emboli. No pleural or pericardial effusion. Borderline enlarged AP window, prevascular, right hilar, and precarinal lymph nodes measuring up to the 11 mm short axis diameter. Subpleural blebs in both lung apices. Minimal dependent atelectasis posteriorly in both lower lobes with some patchy airspace consolidation or subsegmental atelectasis posterolaterally at the right lung base. Thoracic spine and sternum intact.  Review of the MIP images confirms the above findings.  CTA ABDOMEN AND PELVIS FINDINGS  Arterial findings:  Aorta: Mild scattered  calcified plaque in the suprarenal segment. Fusiform infrarenal aneurysm, 5.8 x 5.6 cm maximum transverse dimensions, tapering to a diameter of 3.4 cm at the bifurcation. Short infrarenal neck less than 3 cm. There is a large amount of intraluminal mural thrombus in the aneurysmal segment. No dissection or stenosis.  Celiac axis:         Patent.  Superior mesenteric: Patent, with replaced right hepatic arterial supply, an anatomic variant.  Left renal:          Single, patent.  Right renal:         Single, patent.  Inferior mesenteric: Short segment origin occlusion, reconstituted distally by visceral collaterals.  Left iliac: Eccentric calcified plaque through the common iliac artery. There is a short segment of at least 50% diameter stenosis at the origin of the common iliac artery. There is a web-like stenosis at the origin of the external iliac artery resulting greater than 50% diameter narrowing, with some mild plaque in the more distal external iliac artery but no tandem stenotic lesion. Eccentric plaque in the internal iliac artery which is patent. No aneurysm.  Right iliac: Calcified plaque throughout the common iliac artery without high-grade stenosis  or aneurysm. There is mild eccentric plaque in the proximal internal iliac artery. External iliac widely patent. No aneurysm or dissection.  Venous findings: Dedicated venous phase imaging not obtained. Patent portal vein, splenic vein, bilateral renal veins.  Review of the MIP images confirms the above findings.  Nonvascular findings: Unremarkable arterial phase evaluation of liver. Spleen mildly enlarged, 16.2 cm maximum length. 19 mm left adrenal nodule. Pancreas and right adrenal gland unremarkable. Several subcentimeter partially calcified stones layer in the dependent aspect of the gallbladder without wall thickening or enhancement. Small bilateral renal cysts. No hydronephrosis. Stomach, small bowel, and colon are nondilated. Innumerable distal  descending and sigmoid diverticula. Persistent mild inflammatory change around the proximal sigmoid colon. No abscess. No free air. Urinary bladder is physiologically distended. Moderate prostatic enlargement with central coarse calcifications. No ascites. No adenopathy. Advanced degenerative disc disease L5-S1 with posterior protrusion.  IMPRESSION: 1. 5.8 cm fusiform infrarenal abdominal aortic aneurysm without evidence of rupture or other complicating features. Vascular surgery consultation recommended due to increased risk of rupture for AAA >5.5 cm. This recommendation follows ACR consensus. This recommendation follows ACR consensus guidelines: White Paper of the ACR Incidental Findings Committee II on Vascular Findings. J Am Coll Radiol 2013; 10:789-794. 2. Tandem stenoses in the proximal and distal left common iliac artery. 3. Nonspecific right hilar and mediastinal adenopathy. 4. Patchy airspace infiltrate in the posterolateral right lower lobe. 5. Splenomegaly 6. Cholelithiasis 7. Stable left adrenal nodule, probably benign adenoma in the absence of a history of primary carcinoma. 8. Stable mild sigmoid diverticulitis without abscess.   Electronically Signed   By: Arne Cleveland M.D.   On: 06/07/2013 11:27   Ct Angio Abd/pel W/ And/or W/o  06/07/2013   CLINICAL DATA:  Aortic aneurysm  EXAM: CT ANGIOGRAPHY CHEST, ABDOMEN AND PELVIS  TECHNIQUE: Multidetector CT imaging through the chest, abdomen and pelvis was performed using the standard protocol during bolus administration of intravenous contrast. Multiplanar reconstructed images and MIPs were obtained and reviewed to evaluate the vascular anatomy.  CONTRAST:  142mL OMNIPAQUE IOHEXOL 350 MG/ML SOLN  COMPARISON:  06/05/2013  FINDINGS: CTA CHEST FINDINGS  No precontrast images were done. Patchy coronary, aortic arch, and descending thoracic aortic calcifications. Adequate contrast opacification of the thoracic aorta with no evidence of dissection,  aneurysm, or stenosis. There is classic 3-vessel brachiocephalic arch anatomy without proximal stenosis. Good contrast opacification of pulmonary artery branches; the exam was not optimized for detection of pulmonary emboli. No pleural or pericardial effusion. Borderline enlarged AP window, prevascular, right hilar, and precarinal lymph nodes measuring up to the 11 mm short axis diameter. Subpleural blebs in both lung apices. Minimal dependent atelectasis posteriorly in both lower lobes with some patchy airspace consolidation or subsegmental atelectasis posterolaterally at the right lung base. Thoracic spine and sternum intact.  Review of the MIP images confirms the above findings.  CTA ABDOMEN AND PELVIS FINDINGS  Arterial findings:  Aorta: Mild scattered calcified plaque in the suprarenal segment. Fusiform infrarenal aneurysm, 5.8 x 5.6 cm maximum transverse dimensions, tapering to a diameter of 3.4 cm at the bifurcation. Short infrarenal neck less than 3 cm. There is a large amount of intraluminal mural thrombus in the aneurysmal segment. No dissection or stenosis.  Celiac axis:         Patent.  Superior mesenteric: Patent, with replaced right hepatic arterial supply, an anatomic variant.  Left renal:          Single, patent.  Right renal:  Single, patent.  Inferior mesenteric: Short segment origin occlusion, reconstituted distally by visceral collaterals.  Left iliac: Eccentric calcified plaque through the common iliac artery. There is a short segment of at least 50% diameter stenosis at the origin of the common iliac artery. There is a web-like stenosis at the origin of the external iliac artery resulting greater than 50% diameter narrowing, with some mild plaque in the more distal external iliac artery but no tandem stenotic lesion. Eccentric plaque in the internal iliac artery which is patent. No aneurysm.  Right iliac: Calcified plaque throughout the common iliac artery without high-grade stenosis or  aneurysm. There is mild eccentric plaque in the proximal internal iliac artery. External iliac widely patent. No aneurysm or dissection.  Venous findings: Dedicated venous phase imaging not obtained. Patent portal vein, splenic vein, bilateral renal veins.  Review of the MIP images confirms the above findings.  Nonvascular findings: Unremarkable arterial phase evaluation of liver. Spleen mildly enlarged, 16.2 cm maximum length. 19 mm left adrenal nodule. Pancreas and right adrenal gland unremarkable. Several subcentimeter partially calcified stones layer in the dependent aspect of the gallbladder without wall thickening or enhancement. Small bilateral renal cysts. No hydronephrosis. Stomach, small bowel, and colon are nondilated. Innumerable distal descending and sigmoid diverticula. Persistent mild inflammatory change around the proximal sigmoid colon. No abscess. No free air. Urinary bladder is physiologically distended. Moderate prostatic enlargement with central coarse calcifications. No ascites. No adenopathy. Advanced degenerative disc disease L5-S1 with posterior protrusion.  IMPRESSION: 1. 5.8 cm fusiform infrarenal abdominal aortic aneurysm without evidence of rupture or other complicating features. Vascular surgery consultation recommended due to increased risk of rupture for AAA >5.5 cm. This recommendation follows ACR consensus. This recommendation follows ACR consensus guidelines: White Paper of the ACR Incidental Findings Committee II on Vascular Findings. J Am Coll Radiol 2013; 10:789-794. 2. Tandem stenoses in the proximal and distal left common iliac artery. 3. Nonspecific right hilar and mediastinal adenopathy. 4. Patchy airspace infiltrate in the posterolateral right lower lobe. 5. Splenomegaly 6. Cholelithiasis 7. Stable left adrenal nodule, probably benign adenoma in the absence of a history of primary carcinoma. 8. Stable mild sigmoid diverticulitis without abscess.   Electronically Signed    By: Arne Cleveland M.D.   On: 06/07/2013 11:27   Admission HPI:  [name] is a 68 y.o. male w/ PMHx of HTN, HLD, CAD (STEMI w/ PCI to LM, 2010), AAA (5.8 cm), Diverticulitis, OA, normocytic anemia, and tobacco abuse, presents to the ED w/ complaints of continued nausea/vomiting. The patient was discharged yesterday (06/12/13), after being admitted for the same complaints and treated for diverticulitis vs possible gastroenteritis. Patient claims that when he was discharged, his symptoms were completely resolved and he felt well, tolerating po intake. When he got home, he had some food (sausage, egg, coffee) and says he tolerated that well for some time. Later on in the evening, patient started to feel nauseous once again, and had an episode of NBNB emesis w/ associated abdominal cramping and pain. The patient also admitted to one episode of diarrhea yesterday evening as well, but denies blood or mucus. The patient also admits to a fever at home of 101.2 and associated chills. No dizziness, lightheadedness, chest pain, SOB, or palpitations.   During previous admission, patient had an incidentally discovered 5.8 cm AAA via CT abdomen/pelvis on 06/05/13 And follow up CTA abdomen/pelvis which showed fusiform infrarenal abdominal aortic aneurysm without evidence of rupture or other complicating features. Patient is scheduled  to have a repair on 06/18/13.  During previous admission, patient also had a right AC fossa cellulitis 2/2 peripheral IV which he was treated for w/ IV antibiotics and sent home w/ a Rx for Keflex. On exam today, no significant erythema, swelling, pain, or increased warmth.   While in ED, patient also received CXR which showed very fine airspace disease in the right lower lobe suggesting pulmonary edema or infection.  Hospital Course by problem list:  Chronic cholecystitis and cholelithiasis s/p lab cholecystectomy 06/19/2013- Patient presented on 5/31 with recurrent N/V/D (no abd  pain) after he was discharged on 5/30 for similar symptoms (thought to be due to mild diverticulitis vs gastroenteritis). On admission, lactic acid slightly elevated, though this quickly resolved. C diff PRC negative. BCX x 2 negative. Patient with stable VS and no leukocytosis. Pt received CT abd/pelvis on 6/1 which showed resolution of prior diverticulitis. Symptoms initially thought to be due to keflex patient had been prescribed for his L AC fossa cellulitis, though symptoms persisted after discontinuation of keflex. GI had been consulted during his prior admission for symptom control, so GI was re-consulted. GI felt that symptoms, though atypical, may be gall bladder related given gall stones seen on CT scan. HIDA scan performed on 6/5 showed very poor ejection fraction of gall bladder, pt also with RUQ pain with administration of CCK. Patient was taken to the OR by general surgery for lap chole on 6/6. During his hospitalization symptoms were difficult to control until after the surgery (at which point symptoms resolved), but IV phenergan and zofran were used for nausea and IV dilaudid for pain control. Patient was also treated with protonix 40mg  BID until after the cause of the pain was discovered, then this was discontinued. At time of discharge, patient had complete resolution of his N/V/D and had only slight abd soreness over surgical incisions and RUQ. He was tolerating HH diet well. Patient was told to f/u with general surgery and was given their number. He was given f/u appt with [facility] as one time visit until he establishes PCP (needs PCP and would be good candidate for our clinic, though per pt preference may want community physician).  AAA-  CTA abd/pelvis on 5/25 (last hospitalization) showed 5.8 cm fusiform infrarenal abdominal aortic aneurysm without evidence of rupture or other complicating features. Vascular surgery consultation done during last hospitalization due to increased risk of rupture  for AAA >5.5 cm. Patient had been scheduled for repair on 06/18/2013, though given continued N/V symptoms this was delayed. Vascular surgery saw patient twice during this hospitalization--they would like to see patient as outpatient to assess pt before they reschedule the surgery. Patient was told by vascular surgery that they will call patient with an appointment time. Pt aware he needs to follow up with them and I gave pt their number and asked him to call their office if he does not hear from them within 1 week.  Chest pain- Pt with sharp/cramping chest tightness while retching on HD 2. He does have a hx of CAD with prior MI in 2010 so EKG was done (unchanged from prior) and troponin trended which were negative x 3. Pain resolved completely by HD 4 and did not return during the hospitalization. Suspect this was 2/2 muscle strain w/ retching. Of note, we continued home ASA + Lipitor + Metoprolol.  CXR abnormality, initially read as HCAP- CXR on admission showed possible PNA. However, pt with no s/s of PNA and repeat CXR on 6/1  showed complete resolution of what was thought to be PNA on initial CXR. No cough. Afebrile here without leukocytosis. Pt had been started on vanc and cefepime initially, though these were promptly discontinued after he received only 1 dose given pt not though to have PNA.  Left antecubital Fossa Cellulitis- 2/2 peripheral IV during recent admission, discharged on Keflex. However, when patient was re-admitted his L arm cellulitis appeared to be completely resolved. His keflex was discontinued after receiving it for a total of 4 days since this was thought to be possibly contributing to his continued N/V/D.  Pancytopenia, normocytic Anemia/Thrombocytopenia/leukopenia- CBC on admission w/ Hb of 10.0, improved since recent admission. MCV 89.2. There is some concern for lymphoma given splenomegaly on abd CT as well as some nonspecific adenopathy on CT. Labs were monitored during his  hospital stay and all labs were stable. Last set of labs prior to discharge, WBC 3.8, Hb 8.6, PLT 145K. Per his last discharge summary, there is need to obtain lymph bx during AAA repair, this message was given to Dr. Trula Slade via inbox message that I sent directly to Dr. Trula Slade on 6/8.  HTN- Patient normotensive on admission. Continued home Metoprolol 12.5 bid.  HLD- On Rosuvastatin 10 mg qhs at home. Most recent lipid panel shows cholesterol 81, triglycerides 94, HDL 24, LDL 38. Continued home statin.   Tobacco use disorder: ~30 pack year history. Likely contributed to AAA above. He has quit successfully in the past for 8 months. Notes he had been smoking up until his last hospitalization approx 2 weeks ago. Cessation was encouraged. He was not given a nicotine patch per pt preference as well as desire to decrease risk of worsening N/V. Will need further counseling as outpatient to encouraged continued cessation especially given upcoming AAA repair.  Discharge Vitals:   BP 131/73  Pulse 68  Temp(Src) 98.3 F (36.8 C) (Oral)  Resp 16  Ht 5' 1.2" (1.554 m)  Wt 191 lb 5.8 oz (86.8 kg)  BMI 35.94 kg/m2  SpO2 94%  Discharge Labs:  No results found for this or any previous visit (from the past 24 hour(s)).  Signed: Rebecca Eaton, MD 06/21/2013, 10:46 AM   Time Spent on Discharge: 35 minutes Services Ordered on Discharge: none Equipment Ordered on Discharge: none

## 2013-06-16 NOTE — Progress Notes (Signed)
Pt seen and examined. Pt c/o persistent nausea, vomiting overnight. States he had 4 episodes since last evening. No CP today. No other new complaints  Exam:  Lungs- CTA b/l  Abd- soft, non tender, non distended, Bowel sounds normoactive Cardio- regular rate/rhythm, normal heart sounds  Ext- no LE edema  Gen- AAO*3, NAD   Assessment and Plan:   Nausea and vomiting   - If persistent despite conservative measures will recall GI in AM - C/w zofran prn  - Will attempt to advance diet today - c/w protonix  -Uncertain etiology. Possible cyclical vomiting syndrome v/s antibiotic induced   Chest pain  - Now resolved - troponins negative. Unlikely cardiac etiology  - CP likely secondary to nausea/vomiting  Antecubital fossa cellulitis  - Resolved now.   AAA  - Stable on CT done on this admission  - Will need to follow up with vascular once current symptoms resolve to reschedule surgery  CAD/HTN  -c/w asa, metoprolol, statin   Case d/w patient and resident in detail

## 2013-06-17 ENCOUNTER — Inpatient Hospital Stay (HOSPITAL_COMMUNITY): Payer: Medicare Other

## 2013-06-17 DIAGNOSIS — K8020 Calculus of gallbladder without cholecystitis without obstruction: Secondary | ICD-10-CM

## 2013-06-17 DIAGNOSIS — J189 Pneumonia, unspecified organism: Secondary | ICD-10-CM

## 2013-06-17 DIAGNOSIS — R112 Nausea with vomiting, unspecified: Secondary | ICD-10-CM

## 2013-06-17 LAB — CULTURE, BLOOD (ROUTINE X 2)
CULTURE: NO GROWTH
CULTURE: NO GROWTH

## 2013-06-17 LAB — CBC
HEMATOCRIT: 25.5 % — AB (ref 39.0–52.0)
Hemoglobin: 8.4 g/dL — ABNORMAL LOW (ref 13.0–17.0)
MCH: 29.1 pg (ref 26.0–34.0)
MCHC: 32.9 g/dL (ref 30.0–36.0)
MCV: 88.2 fL (ref 78.0–100.0)
Platelets: 143 10*3/uL — ABNORMAL LOW (ref 150–400)
RBC: 2.89 MIL/uL — ABNORMAL LOW (ref 4.22–5.81)
RDW: 20 % — AB (ref 11.5–15.5)
WBC: 3.7 10*3/uL — ABNORMAL LOW (ref 4.0–10.5)

## 2013-06-17 LAB — BASIC METABOLIC PANEL
BUN: 14 mg/dL (ref 6–23)
CHLORIDE: 106 meq/L (ref 96–112)
CO2: 24 meq/L (ref 19–32)
CREATININE: 1.13 mg/dL (ref 0.50–1.35)
Calcium: 9.5 mg/dL (ref 8.4–10.5)
GFR, EST AFRICAN AMERICAN: 76 mL/min — AB (ref >90)
GFR, EST NON AFRICAN AMERICAN: 66 mL/min — AB (ref >90)
Glucose, Bld: 99 mg/dL (ref 70–99)
POTASSIUM: 3.9 meq/L (ref 3.7–5.3)
SODIUM: 142 meq/L (ref 137–147)

## 2013-06-17 MED ORDER — HYDROXYZINE HCL 50 MG/ML IM SOLN
25.0000 mg | Freq: Once | INTRAMUSCULAR | Status: DC
  Filled 2013-06-17: qty 0.5

## 2013-06-17 MED ORDER — ONDANSETRON HCL 4 MG/2ML IJ SOLN
4.0000 mg | Freq: Once | INTRAMUSCULAR | Status: AC
  Administered 2013-06-17: 4 mg via INTRAVENOUS

## 2013-06-17 MED ORDER — GI COCKTAIL ~~LOC~~
30.0000 mL | Freq: Two times a day (BID) | ORAL | Status: DC | PRN
  Administered 2013-06-17 (×2): 30 mL via ORAL
  Filled 2013-06-17 (×2): qty 30

## 2013-06-17 MED ORDER — HYDROXYZINE HCL 50 MG/ML IM SOLN
25.0000 mg | Freq: Four times a day (QID) | INTRAMUSCULAR | Status: DC | PRN
  Filled 2013-06-17: qty 0.5

## 2013-06-17 MED ORDER — ONDANSETRON 8 MG/NS 50 ML IVPB
8.0000 mg | Freq: Four times a day (QID) | INTRAVENOUS | Status: DC
  Administered 2013-06-17 – 2013-06-18 (×4): 8 mg via INTRAVENOUS
  Filled 2013-06-17 (×5): qty 8

## 2013-06-17 MED ORDER — DICYCLOMINE HCL 10 MG PO CAPS
20.0000 mg | ORAL_CAPSULE | Freq: Four times a day (QID) | ORAL | Status: DC | PRN
  Filled 2013-06-17 (×2): qty 2

## 2013-06-17 MED ORDER — HYDROMORPHONE HCL PF 1 MG/ML IJ SOLN
1.0000 mg | Freq: Once | INTRAMUSCULAR | Status: AC
  Administered 2013-06-17: 1 mg via INTRAVENOUS
  Filled 2013-06-17: qty 1

## 2013-06-17 MED ORDER — METOCLOPRAMIDE HCL 5 MG/ML IJ SOLN
5.0000 mg | Freq: Four times a day (QID) | INTRAMUSCULAR | Status: DC | PRN
  Administered 2013-06-17 – 2013-06-19 (×3): 5 mg via INTRAVENOUS
  Filled 2013-06-17 (×2): qty 1

## 2013-06-17 MED ORDER — DEXTROSE-NACL 5-0.9 % IV SOLN
INTRAVENOUS | Status: DC
  Administered 2013-06-17 (×2): 1000 mL via INTRAVENOUS
  Administered 2013-06-18 – 2013-06-20 (×6): via INTRAVENOUS

## 2013-06-17 NOTE — Progress Notes (Signed)
Notified MD on call that pt needs Zofran but it is not time for Zofran.  Wife called on telephone to nurse's station stating that she has talked to her husband on telephone and he is  throwing up and needing medication for vomiting. Went into pt's room, pt states that he has been vomiting, pt currently sitting on side of bed. Pt requesting to talk doctor tonight about what is going on with him. MD is going to put in order for Zofran 4mg  IV x1. MD states he will come to bed side to talk with pt tonight. Will continue to monitor pt. Ranelle Oyster, RN (charge nurse)

## 2013-06-17 NOTE — Progress Notes (Signed)
Subjective: Patient with approx 3-4 episodes of N/V overnight with continued N/V this AM. 1 episode of diarrhea this AM, none overnight. Pt and family expressed frustration that patient's symptoms are not improving. Spent approx 1h total today talking with family regarding their concerns.   Objective: Vital signs in last 24 hours: Filed Vitals:   06/16/13 1303 06/16/13 2057 06/17/13 0506 06/17/13 0945  BP: 145/71 122/74 134/73 140/85  Pulse: 59 66 60 63  Temp: 98.2 F (36.8 C) 99.3 F (37.4 C) 98.2 F (36.8 C) 98 F (36.7 C)  TempSrc: Oral Oral Oral Oral  Resp: 16 20 18 20   Height:      Weight:      SpO2: 100% 97% 97% 100%   Weight change:  No intake or output data in the 24 hours ending 06/17/13 0949  Physical Exam  General: sitting upright in bed leaning over emesis basin which contained yellow emesis HEENT: NCAT, vision grossly intact, MMM Neck: supple Lungs: clear to ascultation bilaterally, normal work of respiration Heart: regular rate and rhythm, no murmurs, gallops, or rubs Abdomen: soft, nontender, normal BS Extremities: warm, no pedal edema Neurologic: alert & oriented X3, cranial nerves II-XII grossly intact, moving all extremities spontaneously  Lab Results: Basic Metabolic Panel:  Recent Labs Lab 06/16/13 0519 06/17/13 0505  NA 141 142  K 3.6* 3.9  CL 103 106  CO2 25 24  GLUCOSE 114* 99  BUN 14 14  CREATININE 1.08 1.13  CALCIUM 9.1 9.5   Liver Function Tests:  Recent Labs Lab 06/13/13 1622  AST 22  ALT 19  ALKPHOS 64  BILITOT 0.5  PROT 7.8  ALBUMIN 3.9   CBC:  Recent Labs Lab 06/13/13 1622  06/15/13 0625 06/16/13 0519 06/17/13 0505  WBC 6.8  < > 3.8* 3.1* 3.7*  NEUTROABS 5.3  --  2.3  --   --   HGB 10.0*  < > 8.2* 8.1* 8.4*  HCT 29.8*  < > 24.3* 24.8* 25.5*  MCV 89.2  < > 88.7 88.6 88.2  PLT 143*  < > 135* 139* 143*  < > = values in this interval not displayed. Cardiac Enzymes:  Recent Labs Lab 06/15/13 1207  06/15/13 1711 06/15/13 2248  TROPONINI <0.30 <0.30 <0.30   Urine Drug Screen: Drugs of Abuse     Component Value Date/Time   LABOPIA NONE DETECTED 06/05/2013 1724   COCAINSCRNUR NONE DETECTED 06/05/2013 1724   LABBENZ NONE DETECTED 06/05/2013 1724   AMPHETMU NONE DETECTED 06/05/2013 1724   THCU NONE DETECTED 06/05/2013 1724   LABBARB NONE DETECTED 06/05/2013 1724    Urinalysis:  Recent Labs Lab 06/13/13 2223  COLORURINE YELLOW  LABSPEC 1.025  PHURINE 6.0  GLUCOSEU NEGATIVE  HGBUR TRACE*  BILIRUBINUR NEGATIVE  KETONESUR NEGATIVE  PROTEINUR 30*  UROBILINOGEN 0.2  NITRITE NEGATIVE  LEUKOCYTESUR SMALL*   Misc. Labs: Lactate 2.49--> 1.0  Micro Results: Recent Results (from the past 240 hour(s))  CULTURE, BLOOD (ROUTINE X 2)     Status: None   Collection Time    06/09/13  8:15 PM      Result Value Ref Range Status   Specimen Description BLOOD RIGHT ARM   Final   Special Requests BOTTLES DRAWN AEROBIC ONLY 5CC   Final   Culture  Setup Time     Final   Value: 06/10/2013 00:43     Performed at [facility]   Culture     Final   Value: NO GROWTH 5 DAYS  Performed at [facility]   Report Status 06/16/2013 FINAL   Final  CULTURE, BLOOD (ROUTINE X 2)     Status: None   Collection Time    06/09/13  8:30 PM      Result Value Ref Range Status   Specimen Description BLOOD RIGHT HAND   Final   Special Requests BOTTLES DRAWN AEROBIC ONLY 5CC   Final   Culture  Setup Time     Final   Value: 06/10/2013 00:43     Performed at [facility]   Culture     Final   Value: NO GROWTH 5 DAYS     Performed at [facility]   Report Status 06/16/2013 FINAL   Final  CULTURE, BLOOD (ROUTINE X 2)     Status: None   Collection Time    06/10/13  9:05 PM      Result Value Ref Range Status   Specimen Description BLOOD LEFT HAND   Final   Special Requests     Final   Value: BOTTLES DRAWN AEROBIC AND ANAEROBIC 10CC AER,1CC ANA   Culture  Setup Time     Final    Value: 06/11/2013 00:35     Performed at [facility]   Culture     Final   Value: NO GROWTH 5 DAYS     Performed at [facility]   Report Status 06/17/2013 FINAL   Final  CULTURE, BLOOD (ROUTINE X 2)     Status: None   Collection Time    06/10/13  9:21 PM      Result Value Ref Range Status   Specimen Description BLOOD RIGHT HAND   Final   Special Requests     Final   Value: BOTTLES DRAWN AEROBIC AND ANAEROBIC 10CC AER,1CC ANA   Culture  Setup Time     Final   Value: 06/11/2013 00:36     Performed at [facility]   Culture     Final   Value: NO GROWTH 5 DAYS     Performed at [facility]   Report Status 06/17/2013 FINAL   Final  CULTURE, BLOOD (ROUTINE X 2)     Status: None   Collection Time    06/13/13  5:52 PM      Result Value Ref Range Status   Specimen Description BLOOD RIGHT ARM   Final   Special Requests BOTTLES DRAWN AEROBIC AND ANAEROBIC 10CC EACH   Final   Culture  Setup Time     Final   Value: 06/14/2013 01:15     Performed at [facility]   Culture     Final   Value:        BLOOD CULTURE RECEIVED NO GROWTH TO DATE CULTURE WILL BE HELD FOR 5 DAYS BEFORE ISSUING A FINAL NEGATIVE REPORT     Performed at [facility]   Report Status PENDING   Incomplete  CULTURE, BLOOD (ROUTINE X 2)     Status: None   Collection Time    06/13/13  6:05 PM      Result Value Ref Range Status   Specimen Description BLOOD RIGHT HAND   Final   Special Requests BOTTLES DRAWN AEROBIC AND ANAEROBIC 10CC EACH   Final   Culture  Setup Time     Final   Value: 06/14/2013 01:15     Performed at [facility]   Culture     Final   Value:  BLOOD CULTURE RECEIVED NO GROWTH TO DATE CULTURE WILL BE HELD FOR 5 DAYS BEFORE ISSUING A FINAL NEGATIVE REPORT     Performed at [facility]   Report Status PENDING   Incomplete  CLOSTRIDIUM DIFFICILE BY PCR     Status: None   Collection Time    06/14/13 11:55 AM      Result Value Ref  Range Status   C difficile by pcr NEGATIVE  NEGATIVE Final   Studies/Results: No results found. Medications: I have reviewed the patient's current medications. Scheduled Meds: . aspirin EC  81 mg Oral Daily  . atorvastatin  20 mg Oral q1800  . feeding supplement (RESOURCE BREEZE)  1 Container Oral BID BM  . heparin  5,000 Units Subcutaneous 3 times per day  . metoprolol tartrate  12.5 mg Oral BID  . nicotine  14 mg Transdermal Daily  . ondansetron (ZOFRAN) IV  8 mg Intravenous 4 times per day  . pantoprazole  40 mg Oral BID  . potassium chloride  40 mEq Oral BID  . sodium chloride  3 mL Intravenous Q12H   Continuous Infusions:   PRN Meds:.LORazepam, metoCLOPramide (REGLAN) injection, nitroGLYCERIN  Assessment/Plan:  Nausea/Vomiting/Diarrhea- Continued N/V with improvement of diarrhea. VS remain stable. No leukocytosis or other signs of infection. Unclear etiology. Had thought to be due to keflex, though less likely now given we stopped the Keflex about 2 days ago. Upon further questioning of family it sounds as though patient has had these same symptoms for 11 days after eating a meal at a [employer] (wife share the same meal). Symptoms only moderately improved at time of last hospital discharge. DDX includes gastritis, gastroparesis, cholelithiasis (pt with cholelithiasis on CT scan, though sxs not typical). Given continued symptoms, will consult GI today.  -CLD -GI consult  -zofran 8mg  q6h scheduled -hydroxyzine 25mg  IV x 1 dose to see if this helps -GI cocktail BID prn -complete abd [place]  -restart IVF- D5NS @125cc /hr given decreased PO -Protonix 40 mg po bid per GI recs from last admission -BCx NGTD  AAA-  CTA chest 06/07/13 showed incidental 5.8 cm fusiform infrarenal abdominal aortic aneurysm without evidence of rupture or other complicating features. Vascular surgery consultation was recommended due to increased risk of rupture for AAA >5.5 cm. Patient's surgery that had  been scheduled for 6/5 now canceled given continued acute illness. Vascular surgery is following from [place]. -Control symptoms of nausea/vomiting to minimize increased intraabdominal pressure  Normocytic Anemia/Thrombocytopenia- Hb stable. PLT level stable.  HTN- Patient normotensive. On Metoprolol 12.5 bid at home.  -Continue Metoprolol   CAD- Stable. ACS ruled out 6/3 give one episode of chest pain. Myoview on 06/10/13 showed an inferior wall attenuation artifact, otherwise, no perfusion defects and no evidence of stress-induced ischemia. -Continue ASA + Lipitor + Metoprolol  -NTG prn   HLD- On Rosuvastatin 10 mg qhs at home. Most recent lipid panel shows cholesterol 81, triglycerides 94, HDL 24, LDL 38.  -Continue statin  Tobacco use disorder: ~30 pack year history. Likely contributed to AAA above. He has quit successfully in the past for 8 months, though was still smoking up until his hospital admission last week. -Nicotine patch prn; withhold for now in the setting of nausea/vomtiing   DVT/PE PPx- Heparin [place]  Diet- CLD Code-  full  Dispo: Discharge pending clinical improvement.  The patient does have a current PCP Sherren Mocha, MD) and does not need an [facility] hospital follow-up appointment after discharge.  The patient  does not have transportation limitations that hinder transportation to clinic appointments.  .Services Needed at time of discharge: Y = Yes, Blank = No PT:   OT:   RN:   Equipment:   Other:     LOS: 4 days   Rebecca Eaton, MD 06/17/2013, 9:49 AM

## 2013-06-17 NOTE — Progress Notes (Signed)
Pt seen and examined. Pt c/o persistent nausea, vomiting overnight and this morning. Pt also complains of associated abd pain today- spasm like, diffuse, non radiating. Also complained of 1 episode of diarrhea today.  Exam:  Lungs- CTA b/l  Abd- soft, mild diffuse tenderness to palpation, non distended, Bowel sounds normoactive  Cardio- regular rate/rhythm, normal heart sounds  Ext- no LE edema  Gen- AAO*3, NAD   Assessment and Plan:   Nausea and vomiting  - GI consult called. Will follow up recommendations - C/w zofran q 6 hours. - Will start bentyl prn pain. If bentyl does not relieve symptoms would give dilaudid as this seems to help relieve his symptoms - Restart IVF today given poor PO intake. Diet changed to clear liquids  - c/w protonix  - check abd sono today given GB dilatation on CT with gall stones -Uncertain etiology. Possible cholelithiasis, gastritis  Chest pain  - Now resolved  - troponins negative. Unlikely cardiac etiology  - CP likely secondary to nausea/vomiting   Antecubital fossa cellulitis  - Resolved now.   AAA  - Stable on CT done on this admission  - Will need to follow up with vascular once current symptoms resolve to reschedule surgery   CAD/HTN  -c/w asa, metoprolol, statin   Case d/w patient and family at bedside in detail. Family expresses frustration at non resolution of symptoms and uncertain etiology. They are in agreement with current plan. Time spent discussing with patient and family approx 40 min

## 2013-06-17 NOTE — Consult Note (Addendum)
[facility] Consult: 3:20 PM 06/17/2013  LOS: 4 days    Referring Provider:  Dr Dareen Piano Primary Care Physician:  None Primary Gastroenterologist:  Althia Forts, seen as consult 5/23 by Dr Fuller Plan  Reason for Consultation:  Nausea, vomiting   HPI: [name] is a 68 y.o. male.  Hx Mi and PCI 2010. GERD by sxs history.   In pt GI eval for n/v in setting of diverticulitis. CT scan 5/23 without IVCM showed gallstones, 5.8 cm AAA, diverticulosis and mild inflammation at desc/sigmoid colon sugg of diverticulitis.  CT angion also confirmed the AAA with iliac stenosis, GB stones, mild sigmoid diveticulitis and splenomegaly but no liver disease.  Also noted was Hgb of 8.1, platelets of 112.   N/V attributed to acute illness and narcotics and was treated with BID PPI, scheduled Zofran,  Initially treated with cipro/flagyl but this was discontinued by attending MD due to doubt as to pt actually having diverticulitis and ongoing N/V.  About 2 days prior to discharge develooped IV line associated cellulitis with fever, redness, pain and abx restarted.  On Saturday, day of discharger he still had fever of 101.6 per family but was tolerating fulls and felt ready to discharge.  Discharged 5/30 to complete course of Keflex for the  Cellulitis. Had Rx for prn Zofran but not for a PPI.  That afternoon and the Sunday, he slept a lot.  Took his Keflex religiously, broke into sweat ~ 2AM Sunday, no fever at 0830.  Ate small amount of eggs and sausage at 10 AM.  By 1 AM had diarrhea and recurrent non-bloody N/V and was quite weak.  Brought back to hospital and admitted.  He was to have AAA repair 6/5.   Mention made of pursuing outpt colonoscopy after AAA repair.  Pain in abdomen is mid abdomen, has been present since initial presentation.   Not severe. CXR with ? pulm edema vs PNA. 6/1 repeat CT scan showed Resolved diverticulitis but extensive sigmoid diverticulitis, stable gallstones and GB distention but no edema, tiny but unconcerning low densities in liver, stable AAA.   Normal LFTs, Normal WBCs. Hgb 10. Platelets 120 to 143. C diff PCR of stool is negative. No fevers.   The surgery is now postponed due to ongoing N/V, despite schedule Zofran and once daily PPI, prn Bentyl.   Stool not abundant but loose. No urinary sxs.  No cough, no chest pain.  No fevers as inpt.   Past Medical History  Diagnosis Date  . Coronary atherosclerosis of native coronary artery     a. 10/2008 inf STEMI/PCI: LM 50d (IVUS-borderline lesion->med rx), LAD min irregs, LCX 60m, 70d, OM nl, RCA 149m (3.5x28 Vision BMS);  b. 11/2008 Lexiscan MV: EF 65%, no isch/scar.  . Essential hypertension, benign   . Pure hypercholesterolemia   . AAA (abdominal aortic aneurysm)     a. 05/2013 CT: 5.8 cm AAA.  Marland Kitchen Diverticulitis     a. 05/2013 CT: descending/sigmoid jxn w/o abscess.  . Osteoarthritis   . Tobacco abuse     a.  ongoing - 1ppd for better part of 50 yrs.  . Normocytic anemia   . Cellulitis 06/10/2013    Right antecubital fossa at site of IV  03/12/13    Past Surgical History  Procedure Laterality Date  . Cardiac stents  2010    Prior to Admission medications   Medication Sig Start Date End Date Taking? Authorizing Provider  Acetaminophen (TYLENOL PO) Take 1 tablet by mouth every 6 (six) hours as needed (pain).   Yes Historical Provider, MD  cephALEXin (KEFLEX) 500 MG capsule Take 1 capsule (500 mg total) by mouth every 6 (six) hours. 06/12/13  Yes Clinton Gallant, MD  metoprolol tartrate (LOPRESSOR) 25 MG tablet Take 0.5 tablets (12.5 mg total) by mouth 2 (two) times daily. 10/13/12  Yes Sherren Mocha, MD  nitroGLYCERIN (NITROSTAT) 0.4 MG SL tablet Place 1 tablet (0.4 mg total) under the tongue every 5 (five) minutes as needed. 03/24/12  Yes Sherren Mocha,  MD  ondansetron (ZOFRAN) 4 MG tablet Take 1 tablet (4 mg total) by mouth every 8 (eight) hours as needed for nausea or vomiting. 06/12/13  Yes Clinton Gallant, MD  rosuvastatin (CRESTOR) 10 MG tablet Take 10 mg by mouth daily.   Yes Historical Provider, MD  aspirin 81 MG tablet Take 81 mg by mouth daily.      Historical Provider, MD    Scheduled Meds: . aspirin EC  81 mg Oral Daily  . atorvastatin  20 mg Oral q1800  . feeding supplement (RESOURCE BREEZE)  1 Container Oral BID BM  . heparin  5,000 Units Subcutaneous 3 times per day  . metoprolol tartrate  12.5 mg Oral BID  . nicotine  14 mg Transdermal Daily  . ondansetron (ZOFRAN) IV  8 mg Intravenous 4 times per day  . pantoprazole  40 mg Oral BID  . potassium chloride  40 mEq Oral BID  . sodium chloride  3 mL Intravenous Q12H   Infusions: . dextrose 5 % and 0.9% NaCl 1,000 mL (06/17/13 1107)   PRN Meds: dicyclomine, gi cocktail, LORazepam, metoCLOPramide (REGLAN) injection, nitroGLYCERIN   Allergies as of 06/13/2013  . (No Known Allergies)    Family History  Problem Relation Age of Onset  . Heart attack Brother   . Cancer Father   . Cancer Mother     History   Social History  . Marital Status: Married    Spouse Name: N/A    Number of Children: N/A  . Years of Education: N/A   Occupational History  . Not on file.   Social History Main Topics  . Smoking status: Heavy Tobacco Smoker -- 1.00 packs/day for 50 years    Types: Cigarettes    Last Attempt to Quit: 07/25/2010  . Smokeless tobacco: Never Used  . Alcohol Use: No  . Drug Use: No  . Sexual Activity: Not on file   Other Topics Concern  . Not on file   Social History Narrative   Lives in [place] with wife.  Retired Furniture conservator/restorer.  Works out in the yard often - limited by claudication.  Does not routinely exercise.    REVIEW OF SYSTEMS: Constitutional: generally strong but now quite weak. ENT:  No nose bleeds Pulm:  No cough or dyspnea CV:  No palpitations, no  LE edema.  GU:  No hematuria, no frequency GI:  Per HPI.  No dysphagia Heme:  No hx of previous anemia    Transfusions:  None ever Neuro:  No headaches, no peripheral tingling or  numbness Derm:  No itching, no rash or sores.  Endocrine:  No sweats or chills.  No polyuria or dysuria Immunization:  No flu shots Travel:  None beyond local counties in last few months.    PHYSICAL EXAM: Vital signs in last 24 hours: Filed Vitals:   06/17/13 1419  BP: 133/85  Pulse: 54  Temp: 98.6 F (37 C)  Resp: 18   Wt Readings from Last 3 Encounters:  06/13/13 86.8 kg (191 lb 5.8 oz)  06/11/13 97.569 kg (215 lb 1.6 oz)  06/05/13 91.173 kg (201 lb)   General: looks poorly but not toxic.   Head:  No swelling or assymetry  Eyes:  No icterus or pallor Ears:  Not HOH  Nose:  No discharge Mouth:  No lesion, no pallor Neck:  No mass or JVD Lungs:  Clear bil Heart: RRR.  No mrg Abdomen:  Soft , tender RUQ (worse with inspiration) but no guarding or rebound.  Hypoactive BS.   Rectal: deferred   Musc/Skeltl: no joint pain or swelling Extremities:  No erythema at left arm  Neurologic:  Oriented x 3.  No tremor or limb weakness Skin:  No telangectasia or rash Tattoos:  none Nodes:  No cervical adenopathy.    Psych:  Pleasant, subdued affect.   Intake/Output from previous day:   Intake/Output this shift:    LAB RESULTS:  Recent Labs  06/15/13 0625 06/16/13 0519 06/17/13 0505  WBC 3.8* 3.1* 3.7*  HGB 8.2* 8.1* 8.4*  HCT 24.3* 24.8* 25.5*  PLT 135* 139* 143*   BMET Lab Results  Component Value Date   NA 142 06/17/2013   NA 141 06/16/2013   NA 139 06/14/2013   K 3.9 06/17/2013   K 3.6* 06/16/2013   K 3.9 06/14/2013   CL 106 06/17/2013   CL 103 06/16/2013   CL 102 06/14/2013   CO2 24 06/17/2013   CO2 25 06/16/2013   CO2 24 06/14/2013   GLUCOSE 99 06/17/2013   GLUCOSE 114* 06/16/2013   GLUCOSE 95 06/14/2013   BUN 14 06/17/2013   BUN 14 06/16/2013   BUN 20 06/14/2013   CREATININE 1.13 06/17/2013    CREATININE 1.08 06/16/2013   CREATININE 1.21 06/14/2013   CALCIUM 9.5 06/17/2013   CALCIUM 9.1 06/16/2013   CALCIUM 8.6 06/14/2013   LFT No results found for this basename: PROT, ALBUMIN, AST, ALT, ALKPHOS, BILITOT, BILIDIR, IBILI,  in the last 72 hours PT/INR Lab Results  Component Value Date   INR 1.24 06/05/2013   INR 1.66* 10/20/2008   Hepatitis Panel No results found for this basename: HEPBSAG, HCVAB, HEPAIGM, HEPBIGM,  in the last 72 hours C-Diff No components found with this basename: cdiff   Lipase     Component Value Date/Time   LIPASE 51 06/05/2013 0415    Drugs of Abuse     Component Value Date/Time   LABOPIA NONE DETECTED 06/05/2013 1724   COCAINSCRNUR NONE DETECTED 06/05/2013 1724   LABBENZ NONE DETECTED 06/05/2013 1724   AMPHETMU NONE DETECTED 06/05/2013 [place] 06/05/2013 [place] DETECTED 06/05/2013 1724     RADIOLOGY STUDIES: 06/14/2013 CT ABDOMEN AND PELVIS WITH CONTRAST  FINDINGS:  BODY WALL: Unremarkable.  LOWER CHEST: Mild cardiomegaly. Coronary atherosclerosis. Mild  dependent atelectasis.  ABDOMEN/PELVIS:  Liver: Few tiny low densities which are nonspecific. No concerning  focal abnormality.  Biliary: Cholelithiasis. The gallbladder is distended but unchanged  from prior and there is no pericholecystic inflammatory  change.  Pancreas: Unremarkable.  Spleen: No focal abnormality.  Adrenals: Unremarkable.  Kidneys and ureters: Small bilateral renal cysts. No evidence of  solid mass. No hydronephrosis or urolithiasis.  Bladder: Unremarkable.  Reproductive: Unremarkable.  Bowel: Resolved sigmoid diverticulitis. Diverticulosis is extensive  in the sigmoid region. No bowel obstruction. Negative appendix.  Retroperitoneum: No mass or adenopathy.  Peritoneum: No free fluid or gas.  Vascular: Fusiform infrarenal abdominal aortic aneurysm has  unchanged maximal diameter of 5.8 cm. There is no interval  discontinuity of the  atherosclerotic calcification along the outer  wall. Unchanged pattern of luminal thrombus. No periaortic hematoma  or new contour abnormality along the spine. Vascular surgery  consultation has already been recommended.  OSSEOUS: Focally advanced degenerative disc disease at L5-S1.  IMPRESSION:  1. Resolved sigmoid diverticulitis.  2. Unchanged appearance of a 5.8 cm infrarenal abdominal aortic  aneurysm.  3. Cholelithiasis.   ENDOSCOPIC STUDIES: none  IMPRESSION:   *  N/V.  Never really resolved.   Gall stones without elevated LFTS or ductal dilation.  Ultrasound abdomen pending. Has never had EGD, nor colonoscopy.  ? Sigmoid diverticulitis, treated for short period of time.  Resolved by latest CT scan.   *  Anemia.  FOBT negative.    *  Recent left UE cellulitis, resolved  *  Splenomegaly with thrombocytopenia.  No evidence of liver disease  *  AAA.  Repair on hold due to ongoing GI issues.       PLAN:     *  Ultrasound abdomen tonite.  HIDA ordered for AM.    Vena Rua  06/17/2013, 3:20 PM Pager: [phone] Attending MD note:   I have taken a history, examined the patient, and reviewed the chart. I agree with the Advanced Practitioner's impression and recommendations. Acute N&V preceded by diverticulitis which has cleared up as per repeat CT scan. His symptoms now are suggestive of biliary dysfunction. LFT's remain normal but  His abdominal exam  Shows RUQ tenderness. He is for [employer] and we have scheduled him for HIDA scan in am., r/o cystic duct obstruction. AAA repair on hold till we r/o acute cholecystitis.  [facility] Pager # [phone]  Upper abdominal ultrasound shows 3 mobile stones, upper limits normal CBDuct 6.4 mm. He is scheduled for HIDA scan in am.

## 2013-06-17 NOTE — Progress Notes (Signed)
S: Called to beside by nurse per patients request due to another episode of vomiting. Patient is very frustrated. He states that he does not believe that the keflex could have caused his nausea and vomiting as he was having these symptoms prior to starting this medication and continues to have them 3 days after stopping it. We discussed at length the results of his recent admissions. He continues to have intermittent nausea and vomiting that is responsive to IV zofran. He was requesting to receive it every 4 hours rather than every 6. He has normal bowel sounds and is passing gas and had a normal BM this am.  He reports that belching makes his symptoms better. Since this began 11 days ago, he reports a large increase in belching.  O: VSS. Agitated man that was easily redirectable and calmed down after discussing his symptoms.  A+P: Patient appears to have nausea and vomiting due to unknown etiology. Zofran is helpful, but I am hesitant to increase to q4 hrs. Plan to add a second medication (hydroxyzine) to be used in between doses of zofran. If hydroxyzine fails, the patient was instructed to ask for reglan one hour after trying the hydroxyzine. Other etiologies of his n/v include gastritis and gastroparesis.  I suspect that the patient may have intiailly had either food poisoning or viral gastroenteritis that induced gastritis. Perhaps a trial of reglan and or GI cocktail may be warranted.

## 2013-06-18 ENCOUNTER — Inpatient Hospital Stay (HOSPITAL_COMMUNITY): Payer: Medicare Other

## 2013-06-18 ENCOUNTER — Encounter (HOSPITAL_COMMUNITY): Payer: Self-pay | Admitting: General Surgery

## 2013-06-18 DIAGNOSIS — K8010 Calculus of gallbladder with chronic cholecystitis without obstruction: Secondary | ICD-10-CM

## 2013-06-18 DIAGNOSIS — R1011 Right upper quadrant pain: Secondary | ICD-10-CM

## 2013-06-18 DIAGNOSIS — K8020 Calculus of gallbladder without cholecystitis without obstruction: Secondary | ICD-10-CM

## 2013-06-18 LAB — CBC WITH DIFFERENTIAL/PLATELET
BASOS PCT: 1 % (ref 0–1)
Basophils Absolute: 0 10*3/uL (ref 0.0–0.1)
Eosinophils Absolute: 0 10*3/uL (ref 0.0–0.7)
Eosinophils Relative: 1 % (ref 0–5)
HCT: 26.5 % — ABNORMAL LOW (ref 39.0–52.0)
HEMOGLOBIN: 8.6 g/dL — AB (ref 13.0–17.0)
Lymphocytes Relative: 32 % (ref 12–46)
Lymphs Abs: 1 10*3/uL (ref 0.7–4.0)
MCH: 28.9 pg (ref 26.0–34.0)
MCHC: 32.5 g/dL (ref 30.0–36.0)
MCV: 88.9 fL (ref 78.0–100.0)
MONOS PCT: 14 % — AB (ref 3–12)
Monocytes Absolute: 0.4 10*3/uL (ref 0.1–1.0)
NEUTROS ABS: 1.6 10*3/uL — AB (ref 1.7–7.7)
Neutrophils Relative %: 52 % (ref 43–77)
Platelets: 140 10*3/uL — ABNORMAL LOW (ref 150–400)
RBC: 2.98 MIL/uL — ABNORMAL LOW (ref 4.22–5.81)
RDW: 20 % — AB (ref 11.5–15.5)
WBC: 3.1 10*3/uL — ABNORMAL LOW (ref 4.0–10.5)

## 2013-06-18 LAB — BASIC METABOLIC PANEL
BUN: 15 mg/dL (ref 6–23)
CO2: 24 mEq/L (ref 19–32)
Calcium: 9.1 mg/dL (ref 8.4–10.5)
Chloride: 104 mEq/L (ref 96–112)
Creatinine, Ser: 1.28 mg/dL (ref 0.50–1.35)
GFR, EST AFRICAN AMERICAN: 66 mL/min — AB (ref >90)
GFR, EST NON AFRICAN AMERICAN: 57 mL/min — AB (ref >90)
Glucose, Bld: 102 mg/dL — ABNORMAL HIGH (ref 70–99)
POTASSIUM: 4.1 meq/L (ref 3.7–5.3)
Sodium: 141 mEq/L (ref 137–147)

## 2013-06-18 LAB — SURGICAL PCR SCREEN
MRSA, PCR: NEGATIVE
Staphylococcus aureus: NEGATIVE

## 2013-06-18 LAB — HEPATIC FUNCTION PANEL
ALBUMIN: 3.7 g/dL (ref 3.5–5.2)
ALT: 27 U/L (ref 0–53)
AST: 30 U/L (ref 0–37)
Alkaline Phosphatase: 58 U/L (ref 39–117)
Bilirubin, Direct: <0.2 mg/dL (ref 0.0–0.3)
Total Bilirubin: 0.6 mg/dL (ref 0.3–1.2)
Total Protein: 7.1 g/dL (ref 6.0–8.3)

## 2013-06-18 SURGERY — INSERTION, ENDOVASCULAR STENT GRAFT, AORTA, ABDOMINAL
Anesthesia: General

## 2013-06-18 MED ORDER — PIPERACILLIN-TAZOBACTAM 3.375 G IVPB
3.3750 g | Freq: Three times a day (TID) | INTRAVENOUS | Status: DC
  Administered 2013-06-18 – 2013-06-20 (×6): 3.375 g via INTRAVENOUS
  Filled 2013-06-18 (×9): qty 50

## 2013-06-18 MED ORDER — PROMETHAZINE HCL 25 MG/ML IJ SOLN
12.5000 mg | INTRAMUSCULAR | Status: DC | PRN
  Administered 2013-06-19 – 2013-06-21 (×3): 12.5 mg via INTRAVENOUS
  Filled 2013-06-18 (×3): qty 1

## 2013-06-18 MED ORDER — HYDROMORPHONE HCL PF 1 MG/ML IJ SOLN
1.0000 mg | Freq: Once | INTRAMUSCULAR | Status: AC
  Administered 2013-06-18: 1 mg via INTRAVENOUS
  Filled 2013-06-18: qty 1

## 2013-06-18 MED ORDER — HYDROMORPHONE HCL PF 1 MG/ML IJ SOLN
1.0000 mg | INTRAMUSCULAR | Status: DC | PRN
  Administered 2013-06-18 – 2013-06-19 (×5): 1 mg via INTRAVENOUS
  Filled 2013-06-18 (×6): qty 1

## 2013-06-18 MED ORDER — TECHNETIUM TC 99M MEBROFENIN IV KIT
5.0000 | PACK | Freq: Once | INTRAVENOUS | Status: AC | PRN
  Administered 2013-06-18: 5 via INTRAVENOUS

## 2013-06-18 MED ORDER — PANTOPRAZOLE SODIUM 40 MG PO TBEC
40.0000 mg | DELAYED_RELEASE_TABLET | Freq: Every day | ORAL | Status: DC
  Administered 2013-06-20 – 2013-06-21 (×2): 40 mg via ORAL
  Filled 2013-06-18 (×2): qty 1

## 2013-06-18 MED ORDER — STERILE WATER FOR INJECTION IJ SOLN
INTRAMUSCULAR | Status: AC
  Administered 2013-06-18: 10 mL
  Filled 2013-06-18: qty 10

## 2013-06-18 MED ORDER — SINCALIDE 5 MCG IJ SOLR
0.0200 ug/kg | Freq: Once | INTRAMUSCULAR | Status: AC
  Administered 2013-06-18: 1.7 ug via INTRAVENOUS
  Filled 2013-06-18: qty 5

## 2013-06-18 MED ORDER — ONDANSETRON 8 MG/NS 50 ML IVPB
8.0000 mg | Freq: Four times a day (QID) | INTRAVENOUS | Status: DC | PRN
  Administered 2013-06-18: 8 mg via INTRAVENOUS
  Filled 2013-06-18: qty 8

## 2013-06-18 MED ORDER — SINCALIDE 5 MCG IJ SOLR
INTRAMUSCULAR | Status: AC
  Administered 2013-06-18: 1.7 ug via INTRAVENOUS
  Filled 2013-06-18: qty 5

## 2013-06-18 NOTE — Progress Notes (Addendum)
Subjective: Patient's symptoms improved dramatically yesterday after receiving the dilaudid. No N/V at all overnight. However, during the HIDA scan today he had RUQ abd pain and N/V restarted. Pt reports continued N/V this AM despite receiving zofran, reglan.   Objective: Vital signs in last 24 hours: Filed Vitals:   06/17/13 1419 06/17/13 2105 06/18/13 0434 06/18/13 1239  BP: 133/85 135/77 134/77 110/60  Pulse: 54 54 59 80  Temp: 98.6 F (37 C) 98.8 F (37.1 C) 98.1 F (36.7 C)   TempSrc: Oral Oral Oral   Resp: 18 18 18    Height:      Weight:      SpO2: 98% 99% 96%    Weight change:   Intake/Output Summary (Last 24 hours) at 06/18/13 1253 Last data filed at 06/18/13 1100  Gross per 24 hour  Intake    772 ml  Output      3 ml  Net    769 ml   Physical Exam  General: sitting upright in bed leaning over emesis basin, not actively vomiting HEENT: NCAT, vision grossly intact Neck: supple Lungs: clear to ascultation bilaterally, normal work of respiration Heart: regular rate and rhythm, no murmurs, gallops, or rubs Abdomen: soft, mild TTP in RUQ otherwise nontender, normal BS Extremities: warm, no pedal edema Neurologic: alert & oriented X3, cranial nerves II-XII grossly intact, moving all extremities spontaneously  Lab Results: Basic Metabolic Panel:  Recent Labs Lab 06/17/13 0505 06/18/13 0630  NA 142 141  K 3.9 4.1  CL 106 104  CO2 24 24  GLUCOSE 99 102*  BUN 14 15  CREATININE 1.13 1.28  CALCIUM 9.5 9.1   Liver Function Tests:  Recent Labs Lab 06/13/13 1622  AST 22  ALT 19  ALKPHOS 64  BILITOT 0.5  PROT 7.8  ALBUMIN 3.9   CBC:  Recent Labs Lab 06/15/13 0625  06/17/13 0505 06/18/13 0630  WBC 3.8*  < > 3.7* 3.1*  NEUTROABS 2.3  --   --  1.6*  HGB 8.2*  < > 8.4* 8.6*  HCT 24.3*  < > 25.5* 26.5*  MCV 88.7  < > 88.2 88.9  PLT 135*  < > 143* 140*  < > = values in this interval not displayed. Cardiac Enzymes:  Recent Labs Lab  06/15/13 1207 06/15/13 1711 06/15/13 2248  TROPONINI <0.30 <0.30 <0.30   Urine Drug Screen: Drugs of Abuse     Component Value Date/Time   LABOPIA NONE DETECTED 06/05/2013 1724   COCAINSCRNUR NONE DETECTED 06/05/2013 1724   LABBENZ NONE DETECTED 06/05/2013 1724   AMPHETMU NONE DETECTED 06/05/2013 1724   THCU NONE DETECTED 06/05/2013 1724   LABBARB NONE DETECTED 06/05/2013 1724    Urinalysis:  Recent Labs Lab 06/13/13 2223  COLORURINE YELLOW  LABSPEC 1.025  PHURINE 6.0  GLUCOSEU NEGATIVE  HGBUR TRACE*  BILIRUBINUR NEGATIVE  KETONESUR NEGATIVE  PROTEINUR 30*  UROBILINOGEN 0.2  NITRITE NEGATIVE  LEUKOCYTESUR SMALL*   Misc. Labs: Lactate 2.49--> 1.0  Micro Results: Recent Results (from the past 240 hour(s))  CULTURE, BLOOD (ROUTINE X 2)     Status: None   Collection Time    06/09/13  8:15 PM      Result Value Ref Range Status   Specimen Description BLOOD RIGHT ARM   Final   Special Requests BOTTLES DRAWN AEROBIC ONLY 5CC   Final   Culture  Setup Time     Final   Value: 06/10/2013 00:43     Performed at [facility]   Culture     Final   Value: NO GROWTH 5 DAYS     Performed at [facility]   Report Status 06/16/2013 FINAL   Final  CULTURE, BLOOD (ROUTINE X 2)     Status: None   Collection Time    06/09/13  8:30 PM      Result Value Ref Range Status   Specimen Description BLOOD RIGHT HAND   Final   Special Requests BOTTLES DRAWN AEROBIC ONLY 5CC   Final   Culture  Setup Time     Final   Value: 06/10/2013 00:43     Performed at [facility]   Culture     Final   Value: NO GROWTH 5 DAYS     Performed at [facility]   Report Status 06/16/2013 FINAL   Final  CULTURE, BLOOD (ROUTINE X 2)     Status: None   Collection Time    06/10/13  9:05 PM      Result Value Ref Range Status   Specimen Description BLOOD LEFT HAND   Final   Special Requests     Final   Value: BOTTLES DRAWN AEROBIC AND ANAEROBIC 10CC AER,1CC ANA   Culture  Setup  Time     Final   Value: 06/11/2013 00:35     Performed at [facility]   Culture     Final   Value: NO GROWTH 5 DAYS     Performed at [facility]   Report Status 06/17/2013 FINAL   Final  CULTURE, BLOOD (ROUTINE X 2)     Status: None   Collection Time    06/10/13  9:21 PM      Result Value Ref Range Status   Specimen Description BLOOD RIGHT HAND   Final   Special Requests     Final   Value: BOTTLES DRAWN AEROBIC AND ANAEROBIC 10CC AER,1CC ANA   Culture  Setup Time     Final   Value: 06/11/2013 00:36     Performed at [facility]   Culture     Final   Value: NO GROWTH 5 DAYS     Performed at [facility]   Report Status 06/17/2013 FINAL   Final  CULTURE, BLOOD (ROUTINE X 2)     Status: None   Collection Time    06/13/13  5:52 PM      Result Value Ref Range Status   Specimen Description BLOOD RIGHT ARM   Final   Special Requests BOTTLES DRAWN AEROBIC AND ANAEROBIC 10CC EACH   Final   Culture  Setup Time     Final   Value: 06/14/2013 01:15     Performed at [facility]   Culture     Final   Value:        BLOOD CULTURE RECEIVED NO GROWTH TO DATE CULTURE WILL BE HELD FOR 5 DAYS BEFORE ISSUING A FINAL NEGATIVE REPORT     Performed at [facility]   Report Status PENDING   Incomplete  CULTURE, BLOOD (ROUTINE X 2)     Status: None   Collection Time    06/13/13  6:05 PM      Result Value Ref Range Status   Specimen Description BLOOD RIGHT HAND   Final   Special Requests BOTTLES DRAWN AEROBIC AND ANAEROBIC 10CC EACH   Final   Culture  Setup Time     Final   Value: 06/14/2013  01:15     Performed at [facility]     Final   Value:        BLOOD CULTURE RECEIVED NO GROWTH TO DATE CULTURE WILL BE HELD FOR 5 DAYS BEFORE ISSUING A FINAL NEGATIVE REPORT     Performed at [facility]   Report Status PENDING   Incomplete  CLOSTRIDIUM DIFFICILE BY PCR     Status: None   Collection Time    06/14/13 11:55 AM       Result Value Ref Range Status   C difficile by pcr NEGATIVE  NEGATIVE Final   Studies/Results: US Abdomen Complete  06/17/2013   CLINICAL DATA:  68 year old male with abdominal pain, nausea and vomiting.  EXAM: ULTRASOUND ABDOMEN COMPLETE  COMPARISON:  06/14/2013 CT  FINDINGS: Gallbladder:  Three mobile gallstones are identified, each approximately 1 cm. There is no evidence of gallbladder wall thickening, pericholecystic fluid or sonographic Murphy sign.  Common bile duct:  Diameter: 6.4 mm. There is no evidence of intrahepatic or extrahepatic biliary dilatation. The visualized CBD is unremarkable.  Liver:  No focal lesion identified. Within normal limits in parenchymal echogenicity.  IVC:  No abnormality visualized.  Pancreas:  Visualized portion unremarkable.  Spleen:  Size and appearance within normal limits.  Right Kidney:  Length: 11.5 cm. Echogenicity within normal limits. No mass or hydronephrosis visualized. Multiple cysts are identified.  Left Kidney:  Length: 11.9 cm. Echogenicity within normal limits. No mass or hydronephrosis visualized. Multiple cysts are identified.  Abdominal aorta:  A 5.9 cm infrarenal abdominal aortic aneurysm is unchanged from recent CTs.  Other findings:  None.  IMPRESSION: Cholelithiasis without evidence of acute cholecystitis.  5.9 cm infrarenal abdominal aortic aneurysm - unchanged in size from recent 06/14/2013 CT.   Electronically Signed   By: Hassan Rowan M.D.   On: 06/17/2013 17:23   Nm Hepato W/eject Fract  06/18/2013   CLINICAL DATA:  Abdominal pain, nausea  EXAM: NUCLEAR MEDICINE HEPATOBILIARY IMAGING WITH GALLBLADDER EF  TECHNIQUE: Sequential images of the abdomen were obtained out to 60 minutes following intravenous administration of radiopharmaceutical. After slow intravenous infusion of 1.74 micrograms Cholecystokinin, gallbladder ejection fraction was determined.  RADIOPHARMACEUTICALS:  5.0 [place] [id number] Choletec  COMPARISON:  Ultrasound 06/17/2013  FINDINGS:  There is prompt uptake and excretion of radiotracer by the liver. Gallbladder ejection fraction is 3%. At 30 min, normal ejection fraction is greater than 30%.  The patient did experience symptoms during CCK infusion.  IMPRESSION: Markedly low gallbladder ejection fraction. The patient experienced pain with CCK administration.   Electronically Signed   By: Rolm Baptise M.D.   On: 06/18/2013 10:52   Medications: I have reviewed the patient's current medications. Scheduled Meds: . aspirin EC  81 mg Oral Daily  . atorvastatin  20 mg Oral q1800  . feeding supplement (RESOURCE BREEZE)  1 Container Oral BID BM  . heparin  5,000 Units Subcutaneous 3 times per day  . metoprolol tartrate  12.5 mg Oral BID  . nicotine  14 mg Transdermal Daily  . [START ON 06/19/2013] pantoprazole  40 mg Oral Daily  . potassium chloride  40 mEq Oral BID  . sodium chloride  3 mL Intravenous Q12H   Continuous Infusions: . dextrose 5 % and 0.9% NaCl 125 mL/hr at 06/18/13 1236   PRN Meds:.dicyclomine, gi cocktail, LORazepam, metoCLOPramide (REGLAN) injection, nitroGLYCERIN, ondansetron (ZOFRAN) IV, promethazine  Assessment/Plan:  Nausea/Vomiting, suspect chronic cholecystitis-  Given patient's symptoms had completely resolved  until undergoing the HIDA scan this morning, I suspect patient has some gall bladder pathology. HIDA scan showed very poor ejection fraction of gall bladder, pt also with RUQ pain with administration of CCK. VS remain stable. No leukocytosis or other signs of infection.  -consult to surgery given HIDA scan findings -BCx NG (final) -GI consult appreciate recs--added back reglan and phenergan, decreased protonix to 40mg  daily -zofran prn -CLD, though has not been eating 2/2 symptoms -GI cocktail BID prn  AAA-  CTA chest 06/07/13 showed incidental 5.8 cm fusiform infrarenal abdominal aortic aneurysm without evidence of rupture or other complicating features. Vascular surgery consultation was recommended  due to increased risk of rupture for AAA >5.5 cm. Patient's surgery that had been scheduled for 6/5 now canceled given continued acute illness. Vascular surgery is following from [facility]. -Control symptoms of nausea/vomiting to minimize increased intraabdominal pressure  Normocytic Anemia/Thrombocytopenia- Hb stable. PLT level stable.  HTN- Patient normotensive. On Metoprolol 12.5 bid at home.  -Continue Metoprolol   CAD- Stable. ACS ruled out 6/3 give one episode of chest pain. Myoview on 06/10/13 showed an inferior wall attenuation artifact, otherwise, no perfusion defects and no evidence of stress-induced ischemia. -Continue ASA + Lipitor + Metoprolol  -NTG prn   HLD- On Rosuvastatin 10 mg qhs at home. Most recent lipid panel shows cholesterol 81, triglycerides 94, HDL 24, LDL 38.  -Continue statin  Tobacco use disorder: ~30 pack year history. Likely contributed to AAA above. He has quit successfully in the past for 8 months, though was still smoking up until his hospital admission last week. -Nicotine patch prn; withhold for now in the setting of nausea/vomtiing   DVT/PE PPx- Heparin [place]  Diet- CLD Code-  full  Dispo: Discharge pending clinical improvement.  The patient does have a current PCP Sherren Mocha, MD) and does not need an [facility] hospital follow-up appointment after discharge.  The patient does not have transportation limitations that hinder transportation to clinic appointments.  .Services Needed at time of discharge: Y = Yes, Blank = No PT:   OT:   RN:   Equipment:   Other:     LOS: 5 days   Rebecca Eaton, MD 06/18/2013, 12:53 PM

## 2013-06-18 NOTE — Progress Notes (Signed)
Patient had an uneventful night. Denies any n/v. Denies any pain. RN will pass on to day nurse.

## 2013-06-18 NOTE — Progress Notes (Signed)
Daily Rounding Note  06/18/2013, 11:28 AM  LOS: 5 days   SUBJECTIVE:       Still with nausea/clear emesis, none overnight but recurred about 10 minutes after receiving the CCK during HIDA.  Tech says  EF is 3.5%, normal is > 30 % at 2 hours.  Awaiting official reading of HIDA Some general abdominal pain, again started up during CCK phase of HIDA.  OBJECTIVE:         Vital signs in last 24 hours:    Temp:  [98.1 F (36.7 C)-98.8 F (37.1 C)] 98.1 F (36.7 C) (06/05 0434) Pulse Rate:  [54-59] 59 (06/05 0434) Resp:  [18] 18 (06/05 0434) BP: (133-135)/(77-85) 134/77 mmHg (06/05 0434) SpO2:  [96 %-99 %] 96 % (06/05 0434) Last BM Date: 06/16/13 General: pleasant.  Hunched over emesis basin, clear emesis seen.  Looks uncomfortable.  Heart: RRR Chest: clear bil Abdomen: soft, hypoactive BS.  Slight RUQ tenderness.  Extremities: no CCE Neuro/Psych:  Oriented x 3.  Affect subdued.  Alert and appropriate.   Intake/Output from previous day: 06/04 0701 - 06/05 0700 In: -  Out: 3 [Urine:3]  Intake/Output this shift: Total I/O In: 772 [P.O.:222; I.V.:500; IV Piggyback:50] Out: -   Lab Results:  Recent Labs  06/16/13 0519 06/17/13 0505 06/18/13 0630  WBC 3.1* 3.7* 3.1*  HGB 8.1* 8.4* 8.6*  HCT 24.8* 25.5* 26.5*  PLT 139* 143* 140*   BMET  Recent Labs  06/16/13 0519 06/17/13 0505 06/18/13 0630  NA 141 142 141  K 3.6* 3.9 4.1  CL 103 106 104  CO2 25 24 24   GLUCOSE 114* 99 102*  BUN 14 14 15   CREATININE 1.08 1.13 1.28  CALCIUM 9.1 9.5 9.1    Studies/Results: US Abdomen Complete 06/17/2013 COMPARISON:  06/14/2013 CT  FINDINGS: Gallbladder:  Three mobile gallstones are identified, each approximately 1 cm. There is no evidence of gallbladder wall thickening, pericholecystic fluid or sonographic Murphy sign.  Common bile duct:  Diameter: 6.4 mm. There is no evidence of intrahepatic or extrahepatic biliary  dilatation. The visualized CBD is unremarkable.  Liver:  No focal lesion identified. Within normal limits in parenchymal echogenicity.  IVC:  No abnormality visualized.  Pancreas:  Visualized portion unremarkable.  Spleen:  Size and appearance within normal limits.  Right Kidney:  Length: 11.5 cm. Echogenicity within normal limits. No mass or hydronephrosis visualized. Multiple cysts are identified.  Left Kidney:  Length: 11.9 cm. Echogenicity within normal limits. No mass or hydronephrosis visualized. Multiple cysts are identified.  Abdominal aorta:  A 5.9 cm infrarenal abdominal aortic aneurysm is unchanged from recent CTs.  Other findings:  None.  IMPRESSION: Cholelithiasis without evidence of acute cholecystitis.  5.9 cm infrarenal abdominal aortic aneurysm - unchanged in size from recent 06/14/2013 CT.   Electronically Signed   By: Hassan Rowan M.D.   On: 06/17/2013 17:23   Nm Hepato W/eject Fract 06/18/2013   COMPARISON:  Ultrasound 06/17/2013  FINDINGS: There is prompt uptake and excretion of radiotracer by the liver. Gallbladder ejection fraction is 3%. At 30 min, normal ejection fraction is greater than 30%.  The patient did experience symptoms during CCK infusion.  IMPRESSION: Markedly low gallbladder ejection fraction. The patient experienced pain with CCK administration.   Electronically Signed   By: Rolm Baptise M.D.   On: 06/18/2013 10:52   Scheduled Meds: . aspirin EC  81 mg Oral Daily  . atorvastatin  20 mg  Oral q1800  . feeding supplement (RESOURCE BREEZE)  1 Container Oral BID BM  . heparin  5,000 Units Subcutaneous 3 times per day  .  HYDROmorphone (DILAUDID) injection  1 mg Intravenous Once  . metoprolol tartrate  12.5 mg Oral BID  . nicotine  14 mg Transdermal Daily  . [START ON 06/19/2013] pantoprazole  40 mg Oral Daily  . potassium chloride  40 mEq Oral BID  . sodium chloride  3 mL Intravenous Q12H   Continuous Infusions: . dextrose 5 % and 0.9% NaCl 125 mL/hr at 06/18/13 0347    PRN Meds:.dicyclomine, gi cocktail, LORazepam, metoCLOPramide (REGLAN) injection, nitroGLYCERIN, ondansetron (ZOFRAN) IV, promethazine  ASSESMENT:   * N/V.  Gall stones with nrmal  LFTs.  CT scans and ultrasound abdomen shows no cholecystitis or ductal dilatation, just gallstones. HIDA with low EF.   Has never had EGD, nor colonoscopy.  ? Sigmoid diverticulitis, treated for short period of time. Resolved by latest CT scan.  * Anemia. FOBT negative.  * Recent left UE cellulitis, resolved .  No abx currently.  * Splenomegaly with thrombocytopenia. No evidence of liver disease  * AAA. Repair on hold due to ongoing GI issues.      PLAN   *  Needs surgical consult for consideration of lap chole before AAA repair.   *  Add low dose Phenergen, as Zofran not effective.  Asked nurse to give Reglan right  Now. Changed Protonix to once daily.     Vena Rua  06/18/2013, 11:28 AM Pager: [phone] Attending MD note:   I have taken a history, examined the patient, and reviewed the chart. I agree with the Advanced Practitioner's impression and recommendations. As  Above, CCK reproduced pt's symptoms, still vomiting. RUQ tender. Suspect chronic cholecystitis. Please consult general surgery.  [facility] Pager # [phone]

## 2013-06-18 NOTE — Progress Notes (Signed)
   [facility]   Pending ultrasound and we have scheduled him for HIDA scan today per GI work up.   Objective 134/77 59 98.1 F (36.7 C) (Oral) 18 96%  Intake/Output Summary (Last 24 hours) at 06/18/13 0823 Last data filed at 06/18/13 0730  Gross per 24 hour  Intake      0 ml  Output      3 ml  Net     -3 ml      [name] 06/18/2013 8:23 AM --

## 2013-06-18 NOTE — Progress Notes (Signed)
Md updated on patient's nausea and emesis occurrence. Orders received

## 2013-06-18 NOTE — Progress Notes (Signed)
Pt seen and examined. No episodes of nausea or vomiting overnight but symptoms recurred this Am after CCK injection during HIDA. No fevers, no CP, no sob  Exam:  Lungs- CTA b/l  Abd- soft, mild diffuse tenderness to palpation, non distended, Bowel sounds normoactive  Cardio- regular rate/rhythm, normal heart sounds  Ext- no LE edema  Gen- AAO*3, NAD   Assessment and Plan:  Nausea and vomiting likely chronic cholecystitis - GI recommendations appreciated - Will get surgery to eval for likely cholecystitis - Start zosyn today per sx and maintain NPO after midnight - Pt for likely lap chole this admission - Start dilaudid for pain control - C/w zofran q 6 hours. Restart reglan and phenrgan  - d/c bentyl -  C/w IVF, clear liquids  - c/w protonix  - abd sono noted  Chest pain  - Now resolved  - troponins negative. Unlikely cardiac etiology  - CP likely secondary to nausea/vomiting   Antecubital fossa cellulitis  - Resolved now.   AAA  - Stable on CT done on this admission  - Will need to follow up with vascular once current symptoms resolve to reschedule surgery   CAD/HTN  -c/w asa, metoprolol, statin   Case d/w patient in detail. Case d/w residents

## 2013-06-18 NOTE — Consult Note (Signed)
I have seen and examined the patient and agree with the assessment and plans. Symptomatic cholelithiasis I recommend a lap chole I discussed the procedure in detail.  We discussed the risks and benefits of a laparoscopic cholecystectomy and possible cholangiogram including, but not limited to bleeding, infection, injury to surrounding structures such as the intestine or liver, bile leak, retained gallstones, need to convert to an open procedure, prolonged diarrhea, blood clots such as  DVT, common bile duct injury, anesthesia risks, and possible need for additional procedures.  The likelihood of improvement in symptoms and return to the patient's normal status is good. We discussed the typical post-operative recovery course.  Erika Slaby A. Ninfa Linden  MD, FACS

## 2013-06-18 NOTE — Consult Note (Signed)
Reason for Consult: nausea, vomiting, cholelithiasis  Referring Physician: Dr. Rebecca Eaton    HPI: [name] is a 68 y.o. male w/ PMHx of HTN, HLD, CAD (STEMI w/ PCI to LM, 2010), AAA (5.8 cm), diverticulitis, OA, normocytic anemia, tobacco abuse readmitted on 06/13/13 with ongoing nausea and vomiting.  Previously admitted for diverticulitis versus gastroenteritis, discharged home, however, continued to have persistent symptoms after being discharged and eating sausage and egg.  Duration of symptoms is 14 days. Onset was sudden.  Coarse is unchanged.  Severe in severity.  Associated with RUQ abdominal pain.  Denies fever or chills.  No aggravating factors.  Alleviated with dilaudid and anti-emetics.  Time pattern is constant. Denies melena, hematochezia, significant weight loss, diarrhea.  The patient was scheduled to have a AAA repair today with Dr. Trula Slade.  He had a negative lexiscan on 06/10/13.  He denies any chest pains.  He is able to walk up a flight of stairs without any chest pains or shortness of breath.  He had an [place] which showed cholelithiasis without acute cholecystitis which was followed by a HIDA scan which showed markedly low gallbladder ejection fraction.  We have therefore been asked to evaluate the patient.  He has been followed by his GI Dr. Olevia Perches.  He does not have evidence of diverticulitis.  At present time, he is in severe pain.  Had 1x dose of dilaudid 2 hours ago, continues to have vomiting.  He is on heparin and ASA $Remov'81mg'gsaagG$ .     Past Medical History  Diagnosis Date  . Coronary atherosclerosis of native coronary artery     a. 10/2008 inf STEMI/PCI: LM 50d (IVUS-borderline lesion->med rx), LAD min irregs, LCX 76m, 70d, OM nl, RCA 168m (3.5x28 Vision BMS);  b. 11/2008 Lexiscan MV: EF 65%, no isch/scar.  . Essential hypertension, benign   . Pure hypercholesterolemia   . AAA (abdominal aortic aneurysm)     a. 05/2013 CT: 5.8 cm AAA.  Marland Kitchen Diverticulitis     a. 05/2013 CT:  descending/sigmoid jxn w/o abscess.  . Osteoarthritis   . Tobacco abuse     a. ongoing - 1ppd for better part of 50 yrs.  . Normocytic anemia   . Cellulitis 06/10/2013    Right antecubital fossa at site of IV  03/12/13    Past Surgical History  Procedure Laterality Date  . Cardiac stents  2010    Family History  Problem Relation Age of Onset  . Heart attack Brother   . Cancer Father   . Cancer Mother     Social History:  reports that he has been smoking Cigarettes.  He has a 50 pack-year smoking history. He has never used smokeless tobacco. He reports that he does not drink alcohol or use illicit drugs.  Allergies: No Known Allergies  Medications:  Scheduled Meds: . aspirin EC  81 mg Oral Daily  . atorvastatin  20 mg Oral q1800  . feeding supplement (RESOURCE BREEZE)  1 Container Oral BID BM  . heparin  5,000 Units Subcutaneous 3 times per day  . metoprolol tartrate  12.5 mg Oral BID  . nicotine  14 mg Transdermal Daily  . [START ON 06/19/2013] pantoprazole  40 mg Oral Daily  . piperacillin-tazobactam (ZOSYN)  IV  3.375 g Intravenous 3 times per day  . potassium chloride  40 mEq Oral BID  . sodium chloride  3 mL Intravenous Q12H   Continuous Infusions: . dextrose 5 % and 0.9% NaCl 125 mL/hr at  06/18/13 1236   PRN Meds:.dicyclomine, gi cocktail, HYDROmorphone (DILAUDID) injection, LORazepam, metoCLOPramide (REGLAN) injection, nitroGLYCERIN, ondansetron (ZOFRAN) IV, promethazine   Results for orders placed during the hospital encounter of 06/13/13 (from the past 48 hour(s))  BASIC METABOLIC PANEL     Status: Abnormal   Collection Time    06/17/13  5:05 AM      Result Value Ref Range   Sodium 142  137 - 147 mEq/L   Potassium 3.9  3.7 - 5.3 mEq/L   Chloride 106  96 - 112 mEq/L   CO2 24  19 - 32 mEq/L   Glucose, Bld 99  70 - 99 mg/dL   BUN 14  6 - 23 mg/dL   Creatinine, Ser 1.13  0.50 - 1.35 mg/dL   Calcium 9.5  8.4 - 10.5 mg/dL   GFR calc non Af Amer 66 (*) >90 mL/min    GFR calc Af Amer 76 (*) >90 mL/min   Comment: (NOTE)     The eGFR has been calculated using the CKD EPI equation.     This calculation has not been validated in all clinical situations.     eGFR's persistently <90 mL/min signify possible Chronic Kidney     Disease.  CBC     Status: Abnormal   Collection Time    06/17/13  5:05 AM      Result Value Ref Range   WBC 3.7 (*) 4.0 - 10.5 K/uL   RBC 2.89 (*) 4.22 - 5.81 MIL/uL   Hemoglobin 8.4 (*) 13.0 - 17.0 g/dL   HCT 25.5 (*) 39.0 - 52.0 %   MCV 88.2  78.0 - 100.0 fL   MCH 29.1  26.0 - 34.0 pg   MCHC 32.9  30.0 - 36.0 g/dL   RDW 20.0 (*) 11.5 - 15.5 %   Platelets 143 (*) 150 - 400 K/uL  BASIC METABOLIC PANEL     Status: Abnormal   Collection Time    06/18/13  6:30 AM      Result Value Ref Range   Sodium 141  137 - 147 mEq/L   Potassium 4.1  3.7 - 5.3 mEq/L   Chloride 104  96 - 112 mEq/L   CO2 24  19 - 32 mEq/L   Glucose, Bld 102 (*) 70 - 99 mg/dL   BUN 15  6 - 23 mg/dL   Creatinine, Ser 1.28  0.50 - 1.35 mg/dL   Calcium 9.1  8.4 - 10.5 mg/dL   GFR calc non Af Amer 57 (*) >90 mL/min   GFR calc Af Amer 66 (*) >90 mL/min   Comment: (NOTE)     The eGFR has been calculated using the CKD EPI equation.     This calculation has not been validated in all clinical situations.     eGFR's persistently <90 mL/min signify possible Chronic Kidney     Disease.  CBC WITH DIFFERENTIAL     Status: Abnormal   Collection Time    06/18/13  6:30 AM      Result Value Ref Range   WBC 3.1 (*) 4.0 - 10.5 K/uL   RBC 2.98 (*) 4.22 - 5.81 MIL/uL   Hemoglobin 8.6 (*) 13.0 - 17.0 g/dL   HCT 26.5 (*) 39.0 - 52.0 %   MCV 88.9  78.0 - 100.0 fL   MCH 28.9  26.0 - 34.0 pg   MCHC 32.5  30.0 - 36.0 g/dL   RDW 20.0 (*) 11.5 - 15.5 %   Platelets  140 (*) 150 - 400 K/uL   Neutrophils Relative % 52  43 - 77 %   Neutro Abs 1.6 (*) 1.7 - 7.7 K/uL   Lymphocytes Relative 32  12 - 46 %   Lymphs Abs 1.0  0.7 - 4.0 K/uL   Monocytes Relative 14 (*) 3 - 12 %    Monocytes Absolute 0.4  0.1 - 1.0 K/uL   Eosinophils Relative 1  0 - 5 %   Eosinophils Absolute 0.0  0.0 - 0.7 K/uL   Basophils Relative 1  0 - 1 %   Basophils Absolute 0.0  0.0 - 0.1 K/uL    US Abdomen Complete  06/17/2013   CLINICAL DATA:  68 year old male with abdominal pain, nausea and vomiting.  EXAM: ULTRASOUND ABDOMEN COMPLETE  COMPARISON:  06/14/2013 CT  FINDINGS: Gallbladder:  Three mobile gallstones are identified, each approximately 1 cm. There is no evidence of gallbladder wall thickening, pericholecystic fluid or sonographic Murphy sign.  Common bile duct:  Diameter: 6.4 mm. There is no evidence of intrahepatic or extrahepatic biliary dilatation. The visualized CBD is unremarkable.  Liver:  No focal lesion identified. Within normal limits in parenchymal echogenicity.  IVC:  No abnormality visualized.  Pancreas:  Visualized portion unremarkable.  Spleen:  Size and appearance within normal limits.  Right Kidney:  Length: 11.5 cm. Echogenicity within normal limits. No mass or hydronephrosis visualized. Multiple cysts are identified.  Left Kidney:  Length: 11.9 cm. Echogenicity within normal limits. No mass or hydronephrosis visualized. Multiple cysts are identified.  Abdominal aorta:  A 5.9 cm infrarenal abdominal aortic aneurysm is unchanged from recent CTs.  Other findings:  None.  IMPRESSION: Cholelithiasis without evidence of acute cholecystitis.  5.9 cm infrarenal abdominal aortic aneurysm - unchanged in size from recent 06/14/2013 CT.   Electronically Signed   By: Hassan Rowan M.D.   On: 06/17/2013 17:23   Nm Hepato W/eject Fract  06/18/2013   CLINICAL DATA:  Abdominal pain, nausea  EXAM: NUCLEAR MEDICINE HEPATOBILIARY IMAGING WITH GALLBLADDER EF  TECHNIQUE: Sequential images of the abdomen were obtained out to 60 minutes following intravenous administration of radiopharmaceutical. After slow intravenous infusion of 1.74 micrograms Cholecystokinin, gallbladder ejection fraction was determined.   RADIOPHARMACEUTICALS:  5.0 [employer] LM-78M Choletec  COMPARISON:  Ultrasound 06/17/2013  FINDINGS: There is prompt uptake and excretion of radiotracer by the liver. Gallbladder ejection fraction is 3%. At 30 min, normal ejection fraction is greater than 30%.  The patient did experience symptoms during CCK infusion.  IMPRESSION: Markedly low gallbladder ejection fraction. The patient experienced pain with CCK administration.   Electronically Signed   By: Rolm Baptise M.D.   On: 06/18/2013 10:52    Review of Systems  All other systems reviewed and are negative.  Blood pressure 167/79, pulse 54, temperature 98.2 F (36.8 C), temperature source Oral, resp. rate 18, height 5' 1.2" (1.554 m), weight 191 lb 5.8 oz (86.8 kg), SpO2 99.00%. Physical Exam  Constitutional: He is oriented to person, place, and time. He appears well-developed and well-nourished. No distress.  HENT:  Head: Normocephalic and atraumatic.  Mouth/Throat: No oropharyngeal exudate.  Neck: Normal range of motion. Neck supple.  Cardiovascular: Normal rate, regular rhythm, normal heart sounds and intact distal pulses.  Exam reveals no gallop and no friction rub.   No murmur heard. Respiratory: Effort normal and breath sounds normal. No respiratory distress. He has no wheezes.  GI: Soft. He exhibits no distension and no mass. There is no rebound.  RUQ  tenderness, voluntary guarding   Musculoskeletal: Normal range of motion. He exhibits no edema and no tenderness.  Neurological: He is alert and oriented to person, place, and time.  Skin: Skin is warm. No rash noted. He is not diaphoretic. No erythema. No pallor.  Psychiatric: He has a normal mood and affect. His behavior is normal. Judgment and thought content normal.    Assessment/Plan: Hx CAD/STEMI 2010, PCI-Lexiscan negative 06/10/13 AAA(5.8cm)-Dr. Trula Slade following, interval repair planned Nausea/vomiting/RUQ abdominal pain Symptomatic cholelithiasis   -clears, NPO after  midnight -hold heparin after midnight -start zosyn -check LFTs -add dilaudid for pain control -likely proceed with a lap chole this admission, further recommendations to follow after Dr. Ninfa Linden evaluates the patient.    Girard Koontz ANP-BC Pager [phone] 06/18/2013, 1:50 PM

## 2013-06-18 NOTE — Progress Notes (Signed)
RN updated MD on patient's persistant nausea unrealived by prn nausea medications. Orders received.

## 2013-06-19 ENCOUNTER — Encounter (HOSPITAL_COMMUNITY): Payer: Medicare Other | Admitting: Certified Registered Nurse Anesthetist

## 2013-06-19 ENCOUNTER — Inpatient Hospital Stay (HOSPITAL_COMMUNITY): Payer: Medicare Other | Admitting: Certified Registered Nurse Anesthetist

## 2013-06-19 ENCOUNTER — Encounter (HOSPITAL_COMMUNITY)
Admission: EM | Disposition: A | Discharge status: Home / Self Care | Payer: Self-pay | Source: Home / Self Care | Attending: Internal Medicine

## 2013-06-19 ENCOUNTER — Encounter (HOSPITAL_COMMUNITY): Payer: Self-pay | Admitting: Certified Registered Nurse Anesthetist

## 2013-06-19 HISTORY — PX: CHOLECYSTECTOMY: SHX55

## 2013-06-19 LAB — CBC WITH DIFFERENTIAL/PLATELET
Basophils Absolute: 0 10*3/uL (ref 0.0–0.1)
Basophils Relative: 1 % (ref 0–1)
Eosinophils Absolute: 0 10*3/uL (ref 0.0–0.7)
Eosinophils Relative: 1 % (ref 0–5)
HCT: 26 % — ABNORMAL LOW (ref 39.0–52.0)
Hemoglobin: 8.6 g/dL — ABNORMAL LOW (ref 13.0–17.0)
Lymphocytes Relative: 33 % (ref 12–46)
Lymphs Abs: 1 10*3/uL (ref 0.7–4.0)
MCH: 29.4 pg (ref 26.0–34.0)
MCHC: 33.1 g/dL (ref 30.0–36.0)
MCV: 88.7 fL (ref 78.0–100.0)
Monocytes Absolute: 0.4 10*3/uL (ref 0.1–1.0)
Monocytes Relative: 14 % — ABNORMAL HIGH (ref 3–12)
Neutro Abs: 1.5 10*3/uL — ABNORMAL LOW (ref 1.7–7.7)
Neutrophils Relative %: 51 % (ref 43–77)
Platelets: 137 10*3/uL — ABNORMAL LOW (ref 150–400)
RBC: 2.93 MIL/uL — ABNORMAL LOW (ref 4.22–5.81)
RDW: 19.9 % — ABNORMAL HIGH (ref 11.5–15.5)
WBC: 2.9 10*3/uL — ABNORMAL LOW (ref 4.0–10.5)

## 2013-06-19 LAB — BASIC METABOLIC PANEL WITH GFR
BUN: 11 mg/dL (ref 6–23)
CO2: 23 meq/L (ref 19–32)
Calcium: 9 mg/dL (ref 8.4–10.5)
Chloride: 102 meq/L (ref 96–112)
Creatinine, Ser: 1.29 mg/dL (ref 0.50–1.35)
GFR calc Af Amer: 65 mL/min — ABNORMAL LOW
GFR calc non Af Amer: 56 mL/min — ABNORMAL LOW
Glucose, Bld: 104 mg/dL — ABNORMAL HIGH (ref 70–99)
Potassium: 4 meq/L (ref 3.7–5.3)
Sodium: 138 meq/L (ref 137–147)

## 2013-06-19 SURGERY — LAPAROSCOPIC CHOLECYSTECTOMY WITH INTRAOPERATIVE CHOLANGIOGRAM
Anesthesia: General | Site: Abdomen

## 2013-06-19 MED ORDER — ONDANSETRON HCL 4 MG/2ML IJ SOLN
INTRAMUSCULAR | Status: AC
  Filled 2013-06-19: qty 2

## 2013-06-19 MED ORDER — LACTATED RINGERS IV SOLN
INTRAVENOUS | Status: DC | PRN
  Administered 2013-06-19 (×2): via INTRAVENOUS

## 2013-06-19 MED ORDER — EPHEDRINE SULFATE 50 MG/ML IJ SOLN
INTRAMUSCULAR | Status: AC
  Filled 2013-06-19: qty 1

## 2013-06-19 MED ORDER — PROMETHAZINE HCL 25 MG/ML IJ SOLN: 6.2500 mg | INTRAMUSCULAR | Status: DC | PRN

## 2013-06-19 MED ORDER — FENTANYL CITRATE 0.05 MG/ML IJ SOLN
INTRAMUSCULAR | Status: DC | PRN
  Administered 2013-06-19: 100 ug via INTRAVENOUS
  Administered 2013-06-19: 50 ug via INTRAVENOUS

## 2013-06-19 MED ORDER — LIDOCAINE HCL (CARDIAC) 20 MG/ML IV SOLN
INTRAVENOUS | Status: DC | PRN
  Administered 2013-06-19: 80 mg via INTRAVENOUS

## 2013-06-19 MED ORDER — BUPIVACAINE-EPINEPHRINE (PF) 0.25% -1:200000 IJ SOLN
INTRAMUSCULAR | Status: AC
  Filled 2013-06-19: qty 30

## 2013-06-19 MED ORDER — ROCURONIUM BROMIDE 50 MG/5ML IV SOLN
INTRAVENOUS | Status: AC
  Filled 2013-06-19: qty 1

## 2013-06-19 MED ORDER — SODIUM CHLORIDE 0.9 % IR SOLN
Status: DC | PRN
  Administered 2013-06-19: 1000 mL

## 2013-06-19 MED ORDER — MIDAZOLAM HCL 5 MG/5ML IJ SOLN
INTRAMUSCULAR | Status: DC | PRN
  Administered 2013-06-19: 2 mg via INTRAVENOUS

## 2013-06-19 MED ORDER — FENTANYL CITRATE 0.05 MG/ML IJ SOLN
INTRAMUSCULAR | Status: AC
  Filled 2013-06-19: qty 5

## 2013-06-19 MED ORDER — MIDAZOLAM HCL 2 MG/2ML IJ SOLN
INTRAMUSCULAR | Status: AC
  Filled 2013-06-19: qty 2

## 2013-06-19 MED ORDER — HYDROMORPHONE HCL PF 1 MG/ML IJ SOLN: 0.2500 mg | INTRAMUSCULAR | Status: DC | PRN

## 2013-06-19 MED ORDER — PROPOFOL 10 MG/ML IV BOLUS
INTRAVENOUS | Status: AC
  Filled 2013-06-19: qty 20

## 2013-06-19 MED ORDER — SUCCINYLCHOLINE CHLORIDE 20 MG/ML IJ SOLN
INTRAMUSCULAR | Status: AC
  Filled 2013-06-19: qty 1

## 2013-06-19 MED ORDER — EPHEDRINE SULFATE 50 MG/ML IJ SOLN
INTRAMUSCULAR | Status: DC | PRN
  Administered 2013-06-19: 5 mg via INTRAVENOUS

## 2013-06-19 MED ORDER — PROPOFOL 10 MG/ML IV BOLUS
INTRAVENOUS | Status: DC | PRN
  Administered 2013-06-19: 50 mg via INTRAVENOUS
  Administered 2013-06-19 (×2): 30 mg via INTRAVENOUS
  Administered 2013-06-19: 150 mg via INTRAVENOUS

## 2013-06-19 MED ORDER — ONDANSETRON HCL 4 MG/2ML IJ SOLN
INTRAMUSCULAR | Status: DC | PRN
  Administered 2013-06-19: 4 mg via INTRAVENOUS

## 2013-06-19 MED ORDER — OXYCODONE HCL 5 MG PO TABS: 5.0000 mg | ORAL_TABLET | Freq: Once | ORAL | Status: DC | PRN

## 2013-06-19 MED ORDER — LIDOCAINE HCL (CARDIAC) 20 MG/ML IV SOLN
INTRAVENOUS | Status: AC
  Filled 2013-06-19: qty 5

## 2013-06-19 MED ORDER — OXYCODONE HCL 5 MG/5ML PO SOLN: 5.0000 mg | Freq: Once | ORAL | Status: DC | PRN

## 2013-06-19 MED ORDER — OXYCODONE HCL 5 MG PO TABS
5.0000 mg | ORAL_TABLET | ORAL | Status: DC | PRN
  Administered 2013-06-19 – 2013-06-20 (×2): 10 mg via ORAL
  Filled 2013-06-19 (×2): qty 2

## 2013-06-19 MED ORDER — 0.9 % SODIUM CHLORIDE (POUR BTL) OPTIME
TOPICAL | Status: DC | PRN
  Administered 2013-06-19: 1000 mL

## 2013-06-19 MED ORDER — SUCCINYLCHOLINE CHLORIDE 20 MG/ML IJ SOLN
INTRAMUSCULAR | Status: DC | PRN
  Administered 2013-06-19: 100 mg via INTRAVENOUS

## 2013-06-19 MED ORDER — PHENYLEPHRINE 40 MCG/ML (10ML) SYRINGE FOR IV PUSH (FOR BLOOD PRESSURE SUPPORT)
PREFILLED_SYRINGE | INTRAVENOUS | Status: AC
  Filled 2013-06-19: qty 10

## 2013-06-19 MED ORDER — BUPIVACAINE-EPINEPHRINE 0.25% -1:200000 IJ SOLN
INTRAMUSCULAR | Status: DC | PRN
  Administered 2013-06-19: 20 mL

## 2013-06-19 MED ORDER — PHENYLEPHRINE HCL 10 MG/ML IJ SOLN
INTRAMUSCULAR | Status: DC | PRN
  Administered 2013-06-19 (×4): 40 ug via INTRAVENOUS

## 2013-06-19 MED ORDER — STERILE WATER FOR INJECTION IJ SOLN
INTRAMUSCULAR | Status: AC
  Filled 2013-06-19: qty 10

## 2013-06-19 SURGICAL SUPPLY — 41 items
APL SKNCLS STERI-STRIP NONHPOA (GAUZE/BANDAGES/DRESSINGS) ×1
APPLIER CLIP 5 13 M/L LIGAMAX5 (MISCELLANEOUS) ×3
APR CLP MED LRG 5 ANG JAW (MISCELLANEOUS) ×1
BAG SPEC RTRVL LRG 6X4 10 (ENDOMECHANICALS) ×1
BANDAGE ADHESIVE 1X3 (GAUZE/BANDAGES/DRESSINGS) ×12 IMPLANT
BENZOIN TINCTURE PRP APPL 2/3 (GAUZE/BANDAGES/DRESSINGS) ×3 IMPLANT
BNDG ADH 5X3 H2O RPLNT NS (GAUZE/BANDAGES/DRESSINGS) ×1
BNDG COHESIVE 3X5 WHT NS (GAUZE/BANDAGES/DRESSINGS) ×2 IMPLANT
CANISTER SUCTION 2500CC (MISCELLANEOUS) ×3 IMPLANT
CHLORAPREP W/TINT 26ML (MISCELLANEOUS) ×3 IMPLANT
CLIP APPLIE 5 13 M/L LIGAMAX5 (MISCELLANEOUS) ×1 IMPLANT
CLOSURE STERI-STRIP 1/2X4 (GAUZE/BANDAGES/DRESSINGS) ×1
CLSR STERI-STRIP ANTIMIC 1/2X4 (GAUZE/BANDAGES/DRESSINGS) ×2 IMPLANT
COVER MAYO STAND STRL (DRAPES) IMPLANT
COVER SURGICAL LIGHT HANDLE (MISCELLANEOUS) ×3 IMPLANT
DECANTER SPIKE VIAL GLASS SM (MISCELLANEOUS) ×3 IMPLANT
DRAPE C-ARM 42X72 X-RAY (DRAPES) IMPLANT
DRAPE UTILITY 15X26 W/TAPE STR (DRAPE) ×6 IMPLANT
ELECT REM PT RETURN 9FT ADLT (ELECTROSURGICAL) ×3
ELECTRODE REM PT RTRN 9FT ADLT (ELECTROSURGICAL) ×1 IMPLANT
GLOVE SURG SIGNA 7.5 PF LTX (GLOVE) ×3 IMPLANT
GOWN STRL REUS W/ TWL LRG LVL3 (GOWN DISPOSABLE) ×2 IMPLANT
GOWN STRL REUS W/ TWL XL LVL3 (GOWN DISPOSABLE) ×1 IMPLANT
GOWN STRL REUS W/TWL LRG LVL3 (GOWN DISPOSABLE) ×6
GOWN STRL REUS W/TWL XL LVL3 (GOWN DISPOSABLE) ×3
KIT BASIN OR (CUSTOM PROCEDURE TRAY) ×3 IMPLANT
KIT ROOM TURNOVER OR (KITS) ×3 IMPLANT
NS IRRIG 1000ML POUR BTL (IV SOLUTION) ×3 IMPLANT
PAD ARMBOARD 7.5X6 YLW CONV (MISCELLANEOUS) ×3 IMPLANT
POUCH SPECIMEN RETRIEVAL 10MM (ENDOMECHANICALS) ×2 IMPLANT
SCISSORS LAP 5X35 DISP (ENDOMECHANICALS) ×3 IMPLANT
SET CHOLANGIOGRAPH 5 50 .035 (SET/KITS/TRAYS/PACK) IMPLANT
SET IRRIG TUBING LAPAROSCOPIC (IRRIGATION / IRRIGATOR) ×3 IMPLANT
SLEEVE ENDOPATH XCEL 5M (ENDOMECHANICALS) ×6 IMPLANT
SPECIMEN JAR SMALL (MISCELLANEOUS) ×3 IMPLANT
SUT MON AB 4-0 PC3 18 (SUTURE) ×3 IMPLANT
TOWEL OR 17X24 6PK STRL BLUE (TOWEL DISPOSABLE) ×3 IMPLANT
TOWEL OR 17X26 10 PK STRL BLUE (TOWEL DISPOSABLE) ×3 IMPLANT
TRAY LAPAROSCOPIC (CUSTOM PROCEDURE TRAY) ×3 IMPLANT
TROCAR XCEL BLUNT TIP 100MML (ENDOMECHANICALS) ×3 IMPLANT
TROCAR XCEL NON-BLD 5MMX100MML (ENDOMECHANICALS) ×3 IMPLANT

## 2013-06-19 NOTE — Anesthesia Postprocedure Evaluation (Signed)
Anesthesia Post Note  Patient: [name]  Procedure(s) Performed: Procedure(s) (LRB): LAPAROSCOPIC CHOLECYSTECTOMY (N/A)  Anesthesia type: general  Patient location: PACU  Post pain: Pain level controlled  Post assessment: Patient's Cardiovascular Status Stable  Last Vitals:  Filed Vitals:   06/19/13 0915  BP: 131/66  Pulse: 61  Temp:   Resp: 12    Post vital signs: Reviewed and stable  Level of consciousness: sedated  Complications: No apparent anesthesia complications

## 2013-06-19 NOTE — Transfer of Care (Signed)
Immediate Anesthesia Transfer of Care Note  Patient: [name]  Procedure(s) Performed: Procedure(s): LAPAROSCOPIC CHOLECYSTECTOMY (N/A)  Patient Location: PACU  Anesthesia Type:General  Level of Consciousness: awake, alert  and oriented  Airway & Oxygen Therapy: Patient Spontanous Breathing and Patient connected to nasal cannula oxygen  Post-op Assessment: Report given to PACU RN, Post -op Vital signs reviewed and stable and Patient moving all extremities X 4  Post vital signs: Reviewed and stable  Complications: No apparent anesthesia complications

## 2013-06-19 NOTE — Progress Notes (Signed)
Subjective: He reports to be feeling better. Has just had a laparoscopic cholecystectomy this am  without complications. He felt sick to his stomach postoperatively and received some Dilaudid, which helped. No new complaints. He mentions his hope of going home tomorrow morning.  Objective: Vital signs in last 24 hours: Filed Vitals:   06/19/13 0602 06/19/13 0849 06/19/13 0900 06/19/13 0915  BP: 130/80 129/80 133/63 131/66  Pulse: 55 79 62 61  Temp: 98 F (36.7 C) 98.1 F (36.7 C)  97.6 F (36.4 C)  TempSrc: Oral     Resp: 18 14 10 12   Height:      Weight:      SpO2: 97% 100% 100% 97%   Weight change:   Intake/Output Summary (Last 24 hours) at 06/19/13 1248 Last data filed at 06/19/13 0854  Gross per 24 hour  Intake   2147 ml  Output    470 ml  Net   1677 ml   Physical Exam  General: lying in bed. Not in acute distress. Denies pain except mild soreness in his abdomen postoperatively. Neck: supple Lungs: clear to ascultation bilaterally, normal work of respiration Heart: regular rate and rhythm, no murmurs, gallops, or rubs Abdomen: soft, Mild tenderness to palpation. Reduced bowel sounds. Laparoscopic entry sites clean without active bleeding. Extremities: warm, no pedal edema Neurologic: alert & oriented X3.   Lab Results: Basic Metabolic Panel:  Recent Labs Lab 06/18/13 0630 06/19/13 0500  NA 141 138  K 4.1 4.0  CL 104 102  CO2 24 23  GLUCOSE 102* 104*  BUN 15 11  CREATININE 1.28 1.29  CALCIUM 9.1 9.0   Liver Function Tests:  Recent Labs Lab 06/13/13 1622 06/18/13 0630  AST 22 30  ALT 19 27  ALKPHOS 64 58  BILITOT 0.5 0.6  PROT 7.8 7.1  ALBUMIN 3.9 3.7   CBC:  Recent Labs Lab 06/18/13 0630 06/19/13 0500  WBC 3.1* 2.9*  NEUTROABS 1.6* 1.5*  HGB 8.6* 8.6*  HCT 26.5* 26.0*  MCV 88.9 88.7  PLT 140* 137*   Cardiac Enzymes:  Recent Labs Lab 06/15/13 1207 06/15/13 1711 06/15/13 2248  TROPONINI <0.30 <0.30 <0.30   Urine Drug  Screen: Drugs of Abuse     Component Value Date/Time   LABOPIA NONE DETECTED 06/05/2013 1724   COCAINSCRNUR NONE DETECTED 06/05/2013 1724   LABBENZ NONE DETECTED 06/05/2013 1724   AMPHETMU NONE DETECTED 06/05/2013 1724   THCU NONE DETECTED 06/05/2013 1724   LABBARB NONE DETECTED 06/05/2013 1724    Urinalysis:  Recent Labs Lab 06/13/13 2223  COLORURINE YELLOW  LABSPEC 1.025  PHURINE 6.0  GLUCOSEU NEGATIVE  HGBUR TRACE*  BILIRUBINUR NEGATIVE  KETONESUR NEGATIVE  PROTEINUR 30*  UROBILINOGEN 0.2  NITRITE NEGATIVE  LEUKOCYTESUR SMALL*   Misc. Labs: Lactate 2.49--> 1.0  Micro Results: Recent Results (from the past 240 hour(s))  CULTURE, BLOOD (ROUTINE X 2)     Status: None   Collection Time    06/09/13  8:15 PM      Result Value Ref Range Status   Specimen Description BLOOD RIGHT ARM   Final   Special Requests BOTTLES DRAWN AEROBIC ONLY 5CC   Final   Culture  Setup Time     Final   Value: 06/10/2013 00:43     Performed at [facility]   Culture     Final   Value: NO GROWTH 5 DAYS     Performed at [facility]   Report Status 06/16/2013 FINAL  Final  CULTURE, BLOOD (ROUTINE X 2)     Status: None   Collection Time    06/09/13  8:30 PM      Result Value Ref Range Status   Specimen Description BLOOD RIGHT HAND   Final   Special Requests BOTTLES DRAWN AEROBIC ONLY 5CC   Final   Culture  Setup Time     Final   Value: 06/10/2013 00:43     Performed at [facility]   Culture     Final   Value: NO GROWTH 5 DAYS     Performed at [facility]   Report Status 06/16/2013 FINAL   Final  CULTURE, BLOOD (ROUTINE X 2)     Status: None   Collection Time    06/10/13  9:05 PM      Result Value Ref Range Status   Specimen Description BLOOD LEFT HAND   Final   Special Requests     Final   Value: BOTTLES DRAWN AEROBIC AND ANAEROBIC 10CC AER,1CC ANA   Culture  Setup Time     Final   Value: 06/11/2013 00:35     Performed at [facility]    Culture     Final   Value: NO GROWTH 5 DAYS     Performed at [facility]   Report Status 06/17/2013 FINAL   Final  CULTURE, BLOOD (ROUTINE X 2)     Status: None   Collection Time    06/10/13  9:21 PM      Result Value Ref Range Status   Specimen Description BLOOD RIGHT HAND   Final   Special Requests     Final   Value: BOTTLES DRAWN AEROBIC AND ANAEROBIC 10CC AER,1CC ANA   Culture  Setup Time     Final   Value: 06/11/2013 00:36     Performed at [facility]   Culture     Final   Value: NO GROWTH 5 DAYS     Performed at [facility]   Report Status 06/17/2013 FINAL   Final  CULTURE, BLOOD (ROUTINE X 2)     Status: None   Collection Time    06/13/13  5:52 PM      Result Value Ref Range Status   Specimen Description BLOOD RIGHT ARM   Final   Special Requests BOTTLES DRAWN AEROBIC AND ANAEROBIC 10CC EACH   Final   Culture  Setup Time     Final   Value: 06/14/2013 01:15     Performed at [facility]   Culture     Final   Value:        BLOOD CULTURE RECEIVED NO GROWTH TO DATE CULTURE WILL BE HELD FOR 5 DAYS BEFORE ISSUING A FINAL NEGATIVE REPORT     Performed at [facility]   Report Status PENDING   Incomplete  CULTURE, BLOOD (ROUTINE X 2)     Status: None   Collection Time    06/13/13  6:05 PM      Result Value Ref Range Status   Specimen Description BLOOD RIGHT HAND   Final   Special Requests BOTTLES DRAWN AEROBIC AND ANAEROBIC 10CC EACH   Final   Culture  Setup Time     Final   Value: 06/14/2013 01:15     Performed at [facility]   Culture     Final   Value:        BLOOD CULTURE RECEIVED NO GROWTH TO  DATE CULTURE WILL BE HELD FOR 5 DAYS BEFORE ISSUING A FINAL NEGATIVE REPORT     Performed at [facility]   Report Status PENDING   Incomplete  CLOSTRIDIUM DIFFICILE BY PCR     Status: None   Collection Time    06/14/13 11:55 AM      Result Value Ref Range Status   C difficile by pcr NEGATIVE  NEGATIVE Final   SURGICAL PCR SCREEN     Status: None   Collection Time    06/18/13  6:30 PM      Result Value Ref Range Status   MRSA, PCR NEGATIVE  NEGATIVE Final   Staphylococcus aureus NEGATIVE  NEGATIVE Final   Comment:            The Xpert SA Assay (FDA     approved for NASAL specimens     in patients over 20 years of age),     is one component of     a comprehensive surveillance     program.  Test performance has     been validated by [employer] for patients greater     than or equal to 30 year old.     It is not intended     to diagnose infection nor to     guide or monitor treatment.   Studies/Results: US Abdomen Complete  06/17/2013   CLINICAL DATA:  68 year old male with abdominal pain, nausea and vomiting.  EXAM: ULTRASOUND ABDOMEN COMPLETE  COMPARISON:  06/14/2013 CT  FINDINGS: Gallbladder:  Three mobile gallstones are identified, each approximately 1 cm. There is no evidence of gallbladder wall thickening, pericholecystic fluid or sonographic Murphy sign.  Common bile duct:  Diameter: 6.4 mm. There is no evidence of intrahepatic or extrahepatic biliary dilatation. The visualized CBD is unremarkable.  Liver:  No focal lesion identified. Within normal limits in parenchymal echogenicity.  IVC:  No abnormality visualized.  Pancreas:  Visualized portion unremarkable.  Spleen:  Size and appearance within normal limits.  Right Kidney:  Length: 11.5 cm. Echogenicity within normal limits. No mass or hydronephrosis visualized. Multiple cysts are identified.  Left Kidney:  Length: 11.9 cm. Echogenicity within normal limits. No mass or hydronephrosis visualized. Multiple cysts are identified.  Abdominal aorta:  A 5.9 cm infrarenal abdominal aortic aneurysm is unchanged from recent CTs.  Other findings:  None.  IMPRESSION: Cholelithiasis without evidence of acute cholecystitis.  5.9 cm infrarenal abdominal aortic aneurysm - unchanged in size from recent 06/14/2013 CT.   Electronically Signed   By: Hassan Rowan M.D.   On: 06/17/2013 17:23   Nm Hepato W/eject Fract  06/18/2013   CLINICAL DATA:  Abdominal pain, nausea  EXAM: NUCLEAR MEDICINE HEPATOBILIARY IMAGING WITH GALLBLADDER EF  TECHNIQUE: Sequential images of the abdomen were obtained out to 60 minutes following intravenous administration of radiopharmaceutical. After slow intravenous infusion of 1.74 micrograms Cholecystokinin, gallbladder ejection fraction was determined.  RADIOPHARMACEUTICALS:  5.0 [place] [id number] Choletec  COMPARISON:  Ultrasound 06/17/2013  FINDINGS: There is prompt uptake and excretion of radiotracer by the liver. Gallbladder ejection fraction is 3%. At 30 min, normal ejection fraction is greater than 30%.  The patient did experience symptoms during CCK infusion.  IMPRESSION: Markedly low gallbladder ejection fraction. The patient experienced pain with CCK administration.   Electronically Signed   By: Rolm Baptise M.D.   On: 06/18/2013 10:52   Medications: I have reviewed the patient's current medications. Scheduled Meds: . aspirin EC  81 mg Oral Daily  . atorvastatin  20 mg Oral q1800  . feeding supplement (RESOURCE BREEZE)  1 Container Oral BID BM  . heparin  5,000 Units Subcutaneous 3 times per day  . metoprolol tartrate  12.5 mg Oral BID  . nicotine  14 mg Transdermal Daily  . pantoprazole  40 mg Oral Daily  . piperacillin-tazobactam (ZOSYN)  IV  3.375 g Intravenous 3 times per day  . potassium chloride  40 mEq Oral BID  . sodium chloride  3 mL Intravenous Q12H   Continuous Infusions: . dextrose 5 % and 0.9% NaCl 125 mL/hr at 06/19/13 0352   PRN Meds:.gi cocktail, HYDROmorphone (DILAUDID) injection, LORazepam, metoCLOPramide (REGLAN) injection, nitroGLYCERIN, ondansetron (ZOFRAN) IV, oxyCODONE, promethazine  Assessment/Plan:  Nausea/Vomiting, suspect chronic cholecystitis-  Status post laparoscopic cholecystectomy 06/19/2013. Stable postoperatively. Symptoms respond well to Dilaudid. HIDA scan showed very poor  ejection fraction of gall bladder, pt also with RUQ pain with administration of CCK. VS remain stable. No leukocytosis or other signs of infection.  Plan - s/p Laparoscopic cholecystectomy performed today. No immediate postoperative complications - surgery following up - on heparin for dvt ppx - IV dilaudid 1 mg every 2 hours when necessary - Oxycodone 5-10 mg every 4 as needed -GI consult appreciate recs--continue with reglan and phenergan, decreased protonix to 40mg  daily -zofran prn -CLD, though has not been eating 2/2 symptoms -GI cocktail BID prn  AAA-  CTA chest 06/07/13 showed incidental 5.8 cm fusiform infrarenal abdominal aortic aneurysm without evidence of rupture or other complicating features. Vascular surgery consultation was recommended due to increased risk of rupture for AAA >5.5 cm. Patient's surgery that had been scheduled for 6/5 now canceled given continued acute illness. Vascular surgery is following from [facility]. -Control symptoms of nausea/vomiting to minimize increased intraabdominal pressure  Normocytic Anemia/Thrombocytopenia- Hb stable. PLT level stable.  HTN- Patient normotensive. On Metoprolol 12.5 bid at home.  -Continue Metoprolol   CAD- Stable. ACS ruled out 6/3 give one episode of chest pain. Myoview on 06/10/13 showed an inferior wall attenuation artifact, otherwise, no perfusion defects and no evidence of stress-induced ischemia. -Continue ASA + Lipitor + Metoprolol  -NTG prn   HLD- On Rosuvastatin 10 mg qhs at home. Most recent lipid panel shows cholesterol 81, triglycerides 94, HDL 24, LDL 38.  -Continue statin  Tobacco use disorder: ~30 pack year history. Likely contributed to AAA above. He has quit successfully in the past for 8 months, though was still smoking up until his hospital admission last week. -Nicotine patch prn; withhold for now in the setting of nausea/vomtiing   DVT/PE PPx- Heparin [place]  Diet- CLD Code-  full  Dispo: Discharge pending  clinical improvement and post surgical progress.  The patient does have a current PCP Sherren Mocha, MD) and does not need an [facility] hospital follow-up appointment after discharge.  The patient does not have transportation limitations that hinder transportation to clinic appointments.  .Services Needed at time of discharge: Y = Yes, Blank = No PT:   OT:   RN:   Equipment:   Other:     LOS: 6 days   Jessee Avers, MD 06/19/2013, 12:48 PM

## 2013-06-19 NOTE — Anesthesia Preprocedure Evaluation (Addendum)
Anesthesia Evaluation  Patient identified by MRN, date of birth, ID band Patient awake    Reviewed: Allergy & Precautions, H&P , NPO status , Patient's Chart, lab work & pertinent test results, reviewed documented beta blocker date and time   Airway Mallampati: II TM Distance: >3 FB Neck ROM: Full    Dental  (+) Dental Advisory Given, Edentulous Upper   Pulmonary Current Smoker,    Pulmonary exam normal       Cardiovascular hypertension, Pt. on home beta blockers + CAD, + Past MI and + Peripheral Vascular Disease  Myoview on 06/10/13 shows no ischemia and his EF is 63%    Neuro/Psych negative neurological ROS  negative psych ROS   GI/Hepatic negative GI ROS, Neg liver ROS,   Endo/Other  Morbid obesity  Renal/GU negative Renal ROS     Musculoskeletal  (+) Arthritis -, Osteoarthritis,    Abdominal   Peds  Hematology  (+) anemia ,   Anesthesia Other Findings   Reproductive/Obstetrics                         Anesthesia Physical Anesthesia Plan  ASA: III  Anesthesia Plan: General   Post-op Pain Management:    Induction: Intravenous  Airway Management Planned: Oral ETT  Additional Equipment:   Intra-op Plan:   Post-operative Plan: Extubation in OR  Informed Consent: I have reviewed the patients History and Physical, chart, labs and discussed the procedure including the risks, benefits and alternatives for the proposed anesthesia with the patient or authorized representative who has indicated his/her understanding and acceptance.   Dental advisory given  Plan Discussed with: CRNA, Anesthesiologist and Surgeon  Anesthesia Plan Comments:         Anesthesia Quick Evaluation

## 2013-06-19 NOTE — Anesthesia Procedure Notes (Signed)
Procedure Name: Intubation Date/Time: 06/19/2013 8:02 AM Performed by: Blair Heys E Pre-anesthesia Checklist: Patient identified, Emergency Drugs available, Suction available and Patient being monitored Patient Re-evaluated:Patient Re-evaluated prior to inductionOxygen Delivery Method: Circle system utilized Preoxygenation: Pre-oxygenation with 100% oxygen Intubation Type: IV induction and Rapid sequence Laryngoscope Size: Miller and 2 Grade View: Grade I Tube type: Oral Tube size: 7.5 mm Number of attempts: 1 Airway Equipment and Method: Stylet Placement Confirmation: ETT inserted through vocal cords under direct vision,  positive ETCO2 and breath sounds checked- equal and bilateral Secured at: 23 cm Tube secured with: Tape Dental Injury: Teeth and Oropharynx as per pre-operative assessment

## 2013-06-19 NOTE — Progress Notes (Signed)
Patient back from Laparoscopic Cholecystectomy - alert and oriented, no complaints of pain; IV running in right wrist was changed to D5 0.9NS @125cc /hr per MD order. Abdominal incisions dry, clean covered with bandage. Restart cardiac monitor. We will continue to monitor.

## 2013-06-19 NOTE — Progress Notes (Signed)
Still having RUQ abd. Pain, continues to vomit. For lap chole today . Please call  Prn.

## 2013-06-19 NOTE — Op Note (Signed)
Laparoscopic Cholecystectomy Procedure Note  Indications: This patient presents with symptomatic gallbladder disease and will undergo laparoscopic cholecystectomy.  Pre-operative Diagnosis: Calculus of gallbladder without mention of cholecystitis or obstruction  Post-operative Diagnosis: Same  Surgeon: Harl Bowie   Assistants: 0  Anesthesia: General endotracheal anesthesia  ASA Class: 2  Procedure Details  The patient was seen again in the Holding Room. The risks, benefits, complications, treatment options, and expected outcomes were discussed with the patient. The possibilities of reaction to medication, pulmonary aspiration, perforation of viscus, bleeding, recurrent infection, finding a normal gallbladder, the need for additional procedures, failure to diagnose a condition, the possible need to convert to an open procedure, and creating a complication requiring transfusion or operation were discussed with the patient. The likelihood of improving the patient's symptoms with return to their baseline status is good.  The patient and/or family concurred with the proposed plan, giving informed consent. The site of surgery properly noted. The patient was taken to Operating Room, identified as [name] and the procedure verified as Laparoscopic Cholecystectomy with Intraoperative Cholangiogram. A Time Out was held and the above information confirmed.  Prior to the induction of general anesthesia, antibiotic prophylaxis was administered. General endotracheal anesthesia was then administered and tolerated well. After the induction, the abdomen was prepped with Chloraprep and draped in sterile fashion. The patient was positioned in the supine position.  Local anesthetic agent was injected into the skin near the umbilicus and an incision made. We dissected down to the abdominal fascia with blunt dissection.  The fascia was incised vertically and we entered the peritoneal cavity bluntly.  A  pursestring suture of 0-Vicryl was placed around the fascial opening.  The Hasson cannula was inserted and secured with the stay suture.  Pneumoperitoneum was then created with CO2 and tolerated well without any adverse changes in the patient's vital signs. An 11-mm port was placed in the subxiphoid position.  Two 5-mm ports were placed in the right upper quadrant. All skin incisions were infiltrated with a local anesthetic agent before making the incision and placing the trocars.   We positioned the patient in reverse Trendelenburg, tilted slightly to the patient's left.  The gallbladder was identified, the fundus grasped and retracted cephalad. Adhesions were lysed bluntly and with the electrocautery where indicated, taking care not to injure any adjacent organs or viscus. The infundibulum was grasped and retracted laterally, exposing the peritoneum overlying the triangle of Calot. This was then divided and exposed in a blunt fashion. The cystic duct was clearly identified and bluntly dissected circumferentially. A critical view of the cystic duct and cystic artery was obtained.  The cystic duct was then ligated with clips and divided. The cystic artery was, dissected free, ligated with clips and divided as well.   The gallbladder was dissected from the liver bed in retrograde fashion with the electrocautery. The gallbladder was removed and placed in an Endocatch sac. The liver bed was irrigated and inspected. Hemostasis was achieved with the electrocautery. Copious irrigation was utilized and was repeatedly aspirated until clear.  The gallbladder and Endocatch sac were then removed through the umbilical port site.  The pursestring suture was used to close the umbilical fascia.    We again inspected the right upper quadrant for hemostasis.  Pneumoperitoneum was released as we removed the trocars.  4-0 Monocryl was used to close the skin.   Benzoin, steri-strips, and clean dressings were applied. The patient  was then extubated and brought to the recovery  room in stable condition. Instrument, sponge, and needle counts were correct at closure and at the conclusion of the case.   Findings: Chronic Cholecystitis with Cholelithiasis  Estimated Blood Loss: Minimal         Drains: 0         Specimens: Gallbladder           Complications: None; patient tolerated the procedure well.         Disposition: PACU - hemodynamically stable.         Condition: stable

## 2013-06-20 LAB — BASIC METABOLIC PANEL
BUN: 8 mg/dL (ref 6–23)
CALCIUM: 9 mg/dL (ref 8.4–10.5)
CO2: 22 mEq/L (ref 19–32)
Chloride: 100 mEq/L (ref 96–112)
Creatinine, Ser: 1.31 mg/dL (ref 0.50–1.35)
GFR calc Af Amer: 64 mL/min — ABNORMAL LOW (ref >90)
GFR, EST NON AFRICAN AMERICAN: 55 mL/min — AB (ref >90)
Glucose, Bld: 118 mg/dL — ABNORMAL HIGH (ref 70–99)
POTASSIUM: 4.1 meq/L (ref 3.7–5.3)
SODIUM: 137 meq/L (ref 137–147)

## 2013-06-20 LAB — CBC
HCT: 26.1 % — ABNORMAL LOW (ref 39.0–52.0)
HEMOGLOBIN: 8.6 g/dL — AB (ref 13.0–17.0)
MCH: 29.3 pg (ref 26.0–34.0)
MCHC: 33 g/dL (ref 30.0–36.0)
MCV: 88.8 fL (ref 78.0–100.0)
PLATELETS: 145 10*3/uL — AB (ref 150–400)
RBC: 2.94 MIL/uL — AB (ref 4.22–5.81)
RDW: 19.8 % — ABNORMAL HIGH (ref 11.5–15.5)
WBC: 3.8 10*3/uL — AB (ref 4.0–10.5)

## 2013-06-20 LAB — CULTURE, BLOOD (ROUTINE X 2)
Culture: NO GROWTH
Culture: NO GROWTH

## 2013-06-20 NOTE — Progress Notes (Signed)
Seen, agree with above.    Ileus.

## 2013-06-20 NOTE — Progress Notes (Signed)
Subjective: Second day post laparoscopic cholecystectomy. Feeling better without vomiting, but still has some nausea - just received Phenergan. Had bowel movement yesterday. No overnight events.  Objective: Vital signs in last 24 hours: Filed Vitals:   06/19/13 1300 06/19/13 2049 06/20/13 0559 06/20/13 0900  BP: 151/71 145/76 111/63   Pulse: 73 68 68   Temp: 98 F (36.7 C) 98.8 F (37.1 C) 98.8 F (37.1 C) 98 F (36.7 C)  TempSrc: Oral Oral Oral Axillary  Resp: 12 16 16    Height:      Weight:      SpO2: 97% 99% 94%    Weight change:   Intake/Output Summary (Last 24 hours) at 06/20/13 1119 Last data filed at 06/20/13 0900  Gross per 24 hour  Intake 1427.5 ml  Output   1125 ml  Net  302.5 ml   Physical Exam  General: lying in bed. Not in acute distress.  Neck: supple Lungs: clear to ascultation bilaterally, normal work of respiration Heart: regular rate and rhythm, no murmurs, gallops, or rubs Abdomen: soft, Mild tenderness to palpation. Improved bowel sounds. Mild right upper quadrant tenderness-postoperative. Laparoscopic entry sites clean without active bleeding. Extremities: warm, no pedal edema Neurologic: alert & oriented X3.   Lab Results: Basic Metabolic Panel:  Recent Labs Lab 06/19/13 0500 06/20/13 0454  NA 138 137  K 4.0 4.1  CL 102 100  CO2 23 22  GLUCOSE 104* 118*  BUN 11 8  CREATININE 1.29 1.31  CALCIUM 9.0 9.0   Liver Function Tests:  Recent Labs Lab 06/13/13 1622 06/18/13 0630  AST 22 30  ALT 19 27  ALKPHOS 64 58  BILITOT 0.5 0.6  PROT 7.8 7.1  ALBUMIN 3.9 3.7   CBC:  Recent Labs Lab 06/18/13 0630 06/19/13 0500 06/20/13 0454  WBC 3.1* 2.9* 3.8*  NEUTROABS 1.6* 1.5*  --   HGB 8.6* 8.6* 8.6*  HCT 26.5* 26.0* 26.1*  MCV 88.9 88.7 88.8  PLT 140* 137* 145*   Cardiac Enzymes:  Recent Labs Lab 06/15/13 1207 06/15/13 1711 06/15/13 2248  TROPONINI <0.30 <0.30 <0.30   Urine Drug Screen: Drugs of Abuse     Component  Value Date/Time   LABOPIA NONE DETECTED 06/05/2013 1724   COCAINSCRNUR NONE DETECTED 06/05/2013 1724   LABBENZ NONE DETECTED 06/05/2013 1724   AMPHETMU NONE DETECTED 06/05/2013 1724   THCU NONE DETECTED 06/05/2013 1724   LABBARB NONE DETECTED 06/05/2013 1724    Urinalysis:  Recent Labs Lab 06/13/13 2223  COLORURINE YELLOW  LABSPEC 1.025  PHURINE 6.0  GLUCOSEU NEGATIVE  HGBUR TRACE*  BILIRUBINUR NEGATIVE  KETONESUR NEGATIVE  PROTEINUR 30*  UROBILINOGEN 0.2  NITRITE NEGATIVE  LEUKOCYTESUR SMALL*   Misc. Labs: Lactate 2.49--> 1.0  Micro Results: Recent Results (from the past 240 hour(s))  CULTURE, BLOOD (ROUTINE X 2)     Status: None   Collection Time    06/10/13  9:05 PM      Result Value Ref Range Status   Specimen Description BLOOD LEFT HAND   Final   Special Requests     Final   Value: BOTTLES DRAWN AEROBIC AND ANAEROBIC 10CC AER,1CC ANA   Culture  Setup Time     Final   Value: 06/11/2013 00:35     Performed at [facility]   Culture     Final   Value: NO GROWTH 5 DAYS     Performed at [facility]   Report Status 06/17/2013 FINAL   Final  CULTURE, BLOOD (ROUTINE X 2)     Status: None   Collection Time    06/10/13  9:21 PM      Result Value Ref Range Status   Specimen Description BLOOD RIGHT HAND   Final   Special Requests     Final   Value: BOTTLES DRAWN AEROBIC AND ANAEROBIC 10CC AER,1CC ANA   Culture  Setup Time     Final   Value: 06/11/2013 00:36     Performed at [facility]   Culture     Final   Value: NO GROWTH 5 DAYS     Performed at [facility]   Report Status 06/17/2013 FINAL   Final  CULTURE, BLOOD (ROUTINE X 2)     Status: None   Collection Time    06/13/13  5:52 PM      Result Value Ref Range Status   Specimen Description BLOOD RIGHT ARM   Final   Special Requests BOTTLES DRAWN AEROBIC AND ANAEROBIC 10CC EACH   Final   Culture  Setup Time     Final   Value: 06/14/2013 01:15     Performed at [facility]    Culture     Final   Value:        BLOOD CULTURE RECEIVED NO GROWTH TO DATE CULTURE WILL BE HELD FOR 5 DAYS BEFORE ISSUING A FINAL NEGATIVE REPORT     Performed at [facility]   Report Status PENDING   Incomplete  CULTURE, BLOOD (ROUTINE X 2)     Status: None   Collection Time    06/13/13  6:05 PM      Result Value Ref Range Status   Specimen Description BLOOD RIGHT HAND   Final   Special Requests BOTTLES DRAWN AEROBIC AND ANAEROBIC 10CC EACH   Final   Culture  Setup Time     Final   Value: 06/14/2013 01:15     Performed at [facility]   Culture     Final   Value:        BLOOD CULTURE RECEIVED NO GROWTH TO DATE CULTURE WILL BE HELD FOR 5 DAYS BEFORE ISSUING A FINAL NEGATIVE REPORT     Performed at [facility]   Report Status PENDING   Incomplete  CLOSTRIDIUM DIFFICILE BY PCR     Status: None   Collection Time    06/14/13 11:55 AM      Result Value Ref Range Status   C difficile by pcr NEGATIVE  NEGATIVE Final  SURGICAL PCR SCREEN     Status: None   Collection Time    06/18/13  6:30 PM      Result Value Ref Range Status   MRSA, PCR NEGATIVE  NEGATIVE Final   Staphylococcus aureus NEGATIVE  NEGATIVE Final   Comment:            The Xpert SA Assay (FDA     approved for NASAL specimens     in patients over 27 years of age),     is one component of     a comprehensive surveillance     program.  Test performance has     been validated by [employer] for patients greater     than or equal to 32 year old.     It is not intended     to diagnose infection nor to     guide or monitor treatment.   Studies/Results:  No results found. Medications: I have reviewed the patient's current medications. Scheduled Meds: . aspirin EC  81 mg Oral Daily  . atorvastatin  20 mg Oral q1800  . feeding supplement (RESOURCE BREEZE)  1 Container Oral BID BM  . heparin  5,000 Units Subcutaneous 3 times per day  . metoprolol tartrate  12.5 mg Oral BID  . nicotine  14  mg Transdermal Daily  . pantoprazole  40 mg Oral Daily  . potassium chloride  40 mEq Oral BID  . sodium chloride  3 mL Intravenous Q12H   Continuous Infusions: . dextrose 5 % and 0.9% NaCl 125 mL/hr at 06/19/13 2149   PRN Meds:.gi cocktail, HYDROmorphone (DILAUDID) injection, LORazepam, metoCLOPramide (REGLAN) injection, nitroGLYCERIN, ondansetron (ZOFRAN) IV, oxyCODONE, promethazine  Assessment/Plan:  Cholelithiasis-  presenting symptoms of nausea, vomiting, most likely related to cholelithiasis. Symptoms of activity, improved. Status post laparoscopic cholecystectomy 06/19/2013. Stable postoperatively with improved bowel sounds. VS remain stable.  Plan - surgery following up - will advise on disposition - on heparin for dvt ppx - IV dilaudid 1 mg every 2 hours when necessary - Oxycodone 5-10 mg every 4 as needed -GI consult appreciate recs -zofran prn -CLD - can advance as tolerable later.  -GI cocktail BID prn - mobilize out of bed  AAA-  CTA chest 06/07/13 showed incidental 5.8 cm fusiform infrarenal abdominal aortic aneurysm without evidence of rupture or other complicating features. Vascular surgery consultation was recommended due to increased risk of rupture for AAA >5.5 cm. Patient's surgery that had been scheduled for 6/5 but late postponed due acute illness. Vascular surgery is following from [facility]. -Control symptoms of nausea/vomiting to minimize increased intraabdominal pressure  Normocytic Anemia/Thrombocytopenia- Hb stable. PLT level stable.  HTN- Patient normotensive. On Metoprolol 12.5 bid at home.  -Continue Metoprolol   CAD- Stable. ACS ruled out 6/3 give one episode of chest pain. Myoview on 06/10/13 showed an inferior wall attenuation artifact, otherwise, no perfusion defects and no evidence of stress-induced ischemia. -Continue ASA + Lipitor + Metoprolol  -NTG prn   HLD- On Rosuvastatin 10 mg qhs at home. Most recent lipid panel shows cholesterol 81,  triglycerides 94, HDL 24, LDL 38.  -Continue statin  Tobacco use disorder: ~30 pack year history. Likely contributed to AAA above. He has quit successfully in the past for 8 months, though was still smoking up until his hospital admission last week. -Nicotine patch prn; withhold for now in the setting of nausea/vomtiing   DVT/PE PPx- Heparin [place]  Diet- CLD Code-  full  Dispo: Discharge pending clinical improvement and post surgical progress. Surgery will advise on disposition - like discharge over the next 1-2 days. He will be mobilized out of bed.  The patient does have a current PCP Sherren Mocha, MD) and does not need an [facility] hospital follow-up appointment after discharge.  The patient does not have transportation limitations that hinder transportation to clinic appointments.  .Services Needed at time of discharge: Y = Yes, Blank = No PT:   OT:   RN:   Equipment:   Other:     LOS: 7 days   Jessee Avers, MD 06/20/2013, 11:19 AM

## 2013-06-20 NOTE — Progress Notes (Signed)
Patient ID: [name], male   DOB: Dec 22, 1945, 68 y.o.   MRN: [id number]  Subjective: Nausea started again this morning, no vomiting.  No flatus.  Belching.  Voiding.    Objective:  Vital signs:  Filed Vitals:   06/19/13 0915 06/19/13 1300 06/19/13 2049 06/20/13 0559  BP: 131/66 151/71 145/76 111/63  Pulse: 61 73 68 68  Temp: 97.6 F (36.4 C) 98 F (36.7 C) 98.8 F (37.1 C) 98.8 F (37.1 C)  TempSrc:  Oral Oral Oral  Resp: $Remo'12 12 16 16  'HvnfI$ Height:      Weight:      SpO2: 97% 97% 99% 94%    Last BM Date: 06/18/13  Intake/Output   Yesterday:  06/06 0701 - 06/07 0700 In: 2427.5 [P.O.:240; I.V.:2187.5] Out: 1145 [Urine:1125; Blood:20] This shift:    I/O last 3 completed shifts: In: 2427.5 [P.O.:240; I.V.:2187.5] Out: 1595 [Urine:1575; Blood:20]     Physical Exam: General: Pt awake/alert/oriented x4 in no acute distress Abdomen: Soft.  Nondistended.incisions are c/d/i.   No evidence of peritonitis.  No incarcerated hernias. Ext:  SCDs BLE.  No mjr edema.  No cyanosis Skin: No petechiae / purpura   Problem List:   Principal Problem:   HCAP (healthcare-associated pneumonia) Active Problems:   HYPERLIPIDEMIA   TOBACCO ABUSE   HYPERTENSION, UNSPECIFIED   Diverticulitis   AAA (abdominal aortic aneurysm) without rupture   Nausea with vomiting   Cellulitis   Malnutrition of moderate degree   Cholelithiasis   Symptomatic cholelithiasis    Results:   Labs: Results for orders placed during the hospital encounter of 06/13/13 (from the past 60 hour(s))  SURGICAL PCR SCREEN     Status: None   Collection Time    06/18/13  6:30 PM      Result Value Ref Range   MRSA, PCR NEGATIVE  NEGATIVE   Staphylococcus aureus NEGATIVE  NEGATIVE   Comment:            The Xpert SA Assay (FDA     approved for NASAL specimens     in patients over 53 years of age),     is one component of     a comprehensive surveillance     program.  Test performance has     been validated  by [employer] for patients greater     than or equal to 67 year old.     It is not intended     to diagnose infection nor to     guide or monitor treatment.  BASIC METABOLIC PANEL     Status: Abnormal   Collection Time    06/19/13  5:00 AM      Result Value Ref Range   Sodium 138  137 - 147 mEq/L   Potassium 4.0  3.7 - 5.3 mEq/L   Chloride 102  96 - 112 mEq/L   CO2 23  19 - 32 mEq/L   Glucose, Bld 104 (*) 70 - 99 mg/dL   BUN 11  6 - 23 mg/dL   Creatinine, Ser 1.29  0.50 - 1.35 mg/dL   Calcium 9.0  8.4 - 10.5 mg/dL   GFR calc non Af Amer 56 (*) >90 mL/min   GFR calc Af Amer 65 (*) >90 mL/min   Comment: (NOTE)     The eGFR has been calculated using the CKD EPI equation.     This calculation has not been validated in all clinical situations.  eGFR's persistently <90 mL/min signify possible Chronic Kidney     Disease.  CBC WITH DIFFERENTIAL     Status: Abnormal   Collection Time    06/19/13  5:00 AM      Result Value Ref Range   WBC 2.9 (*) 4.0 - 10.5 K/uL   RBC 2.93 (*) 4.22 - 5.81 MIL/uL   Hemoglobin 8.6 (*) 13.0 - 17.0 g/dL   HCT 26.0 (*) 39.0 - 52.0 %   MCV 88.7  78.0 - 100.0 fL   MCH 29.4  26.0 - 34.0 pg   MCHC 33.1  30.0 - 36.0 g/dL   RDW 19.9 (*) 11.5 - 15.5 %   Platelets 137 (*) 150 - 400 K/uL   Neutrophils Relative % 51  43 - 77 %   Neutro Abs 1.5 (*) 1.7 - 7.7 K/uL   Lymphocytes Relative 33  12 - 46 %   Lymphs Abs 1.0  0.7 - 4.0 K/uL   Monocytes Relative 14 (*) 3 - 12 %   Monocytes Absolute 0.4  0.1 - 1.0 K/uL   Eosinophils Relative 1  0 - 5 %   Eosinophils Absolute 0.0  0.0 - 0.7 K/uL   Basophils Relative 1  0 - 1 %   Basophils Absolute 0.0  0.0 - 0.1 K/uL  BASIC METABOLIC PANEL     Status: Abnormal   Collection Time    06/20/13  4:54 AM      Result Value Ref Range   Sodium 137  137 - 147 mEq/L   Potassium 4.1  3.7 - 5.3 mEq/L   Chloride 100  96 - 112 mEq/L   CO2 22  19 - 32 mEq/L   Glucose, Bld 118 (*) 70 - 99 mg/dL   BUN 8  6 - 23 mg/dL    Creatinine, Ser 1.31  0.50 - 1.35 mg/dL   Calcium 9.0  8.4 - 10.5 mg/dL   GFR calc non Af Amer 55 (*) >90 mL/min   GFR calc Af Amer 64 (*) >90 mL/min   Comment: (NOTE)     The eGFR has been calculated using the CKD EPI equation.     This calculation has not been validated in all clinical situations.     eGFR's persistently <90 mL/min signify possible Chronic Kidney     Disease.  CBC     Status: Abnormal   Collection Time    06/20/13  4:54 AM      Result Value Ref Range   WBC 3.8 (*) 4.0 - 10.5 K/uL   RBC 2.94 (*) 4.22 - 5.81 MIL/uL   Hemoglobin 8.6 (*) 13.0 - 17.0 g/dL   HCT 26.1 (*) 39.0 - 52.0 %   MCV 88.8  78.0 - 100.0 fL   MCH 29.3  26.0 - 34.0 pg   MCHC 33.0  30.0 - 36.0 g/dL   RDW 19.8 (*) 11.5 - 15.5 %   Platelets 145 (*) 150 - 400 K/uL    Imaging / Studies: Nm Hepato W/eject Fract  06/18/2013   CLINICAL DATA:  Abdominal pain, nausea  EXAM: NUCLEAR MEDICINE HEPATOBILIARY IMAGING WITH GALLBLADDER EF  TECHNIQUE: Sequential images of the abdomen were obtained out to 60 minutes following intravenous administration of radiopharmaceutical. After slow intravenous infusion of 1.74 micrograms Cholecystokinin, gallbladder ejection fraction was determined.  RADIOPHARMACEUTICALS:  5.0 [place] [id number] Choletec  COMPARISON:  Ultrasound 06/17/2013  FINDINGS: There is prompt uptake and excretion of radiotracer by the liver. Gallbladder ejection fraction is 3%. At  30 min, normal ejection fraction is greater than 30%.  The patient did experience symptoms during CCK infusion.  IMPRESSION: Markedly low gallbladder ejection fraction. The patient experienced pain with CCK administration.   Electronically Signed   By: Rolm Baptise M.D.   On: 06/18/2013 10:52    Scheduled Meds: . aspirin EC  81 mg Oral Daily  . atorvastatin  20 mg Oral q1800  . feeding supplement (RESOURCE BREEZE)  1 Container Oral BID BM  . heparin  5,000 Units Subcutaneous 3 times per day  . metoprolol tartrate  12.5 mg Oral BID   . nicotine  14 mg Transdermal Daily  . pantoprazole  40 mg Oral Daily  . potassium chloride  40 mEq Oral BID  . sodium chloride  3 mL Intravenous Q12H   Continuous Infusions: . dextrose 5 % and 0.9% NaCl 125 mL/hr at 06/19/13 2149   PRN Meds:.gi cocktail, HYDROmorphone (DILAUDID) injection, LORazepam, metoCLOPramide (REGLAN) injection, nitroGLYCERIN, ondansetron (ZOFRAN) IV, oxyCODONE, promethazine   Antibiotics: Anti-infectives   Start     Dose/Rate Route Frequency Ordered Stop   06/18/13 1430  piperacillin-tazobactam (ZOSYN) IVPB 3.375 g     3.375 g 12.5 mL/hr over 240 Minutes Intravenous 3 times per day 06/18/13 1348     06/14/13 0900  vancomycin (VANCOCIN) IVPB 750 mg/150 ml premix  Status:  Discontinued     750 mg 150 mL/hr over 60 Minutes Intravenous Every 12 hours 06/13/13 2005 06/14/13 0652   06/14/13 0730  cephALEXin (KEFLEX) capsule 500 mg  Status:  Discontinued     500 mg Oral 4 times per day 06/14/13 0701 06/15/13 1052   06/13/13 2200  ceFEPIme (MAXIPIME) 1 g in dextrose 5 % 50 mL IVPB  Status:  Discontinued     1 g 100 mL/hr over 30 Minutes Intravenous 3 times per day 06/13/13 1957 06/14/13 0652   06/13/13 1800  vancomycin (VANCOCIN) 1,500 mg in sodium chloride 0.9 % 500 mL IVPB     1,500 mg 250 mL/hr over 120 Minutes Intravenous  Once 06/13/13 1735 06/13/13 2105   06/13/13 1745  piperacillin-tazobactam (ZOSYN) IVPB 3.375 g     3.375 g 100 mL/hr over 30 Minutes Intravenous  Once 06/13/13 1735 06/13/13 1818      Assessment/Plan Hx CAD/STEMI 2010, PCI-Lexiscan negative 06/10/13  AAA(5.8cm)-Dr. Trula Slade following, interval repair planned  Nausea/vomiting/RUQ abdominal pain  Symptomatic cholelithiasis  S/p Lap cholecystectomy--Dr. Ninfa Linden 06/19/13 POD#1 -leave him on clears until nausea subsides, then advance as tolerated -stop zosyn -pain control -IS, mobilize -will follow  Erby Pian, [facility] Surgery Pager [phone] Office  [phone]  06/20/2013 8:53 AM

## 2013-06-21 DIAGNOSIS — D61818 Other pancytopenia: Secondary | ICD-10-CM

## 2013-06-21 DIAGNOSIS — K8010 Calculus of gallbladder with chronic cholecystitis without obstruction: Principal | ICD-10-CM

## 2013-06-21 MED ORDER — OXYCODONE HCL 5 MG PO TABS: 5.0000 mg | ORAL_TABLET | ORAL | Status: DC | PRN

## 2013-06-21 NOTE — Progress Notes (Addendum)
NUTRITION FOLLOW-UP  Pt meets criteria for moderate MALNUTRITION in the context of acute illness as evidenced by <75% estimated energy intake with 5% weight loss in the past month.  DOCUMENTATION CODES Per approved criteria  -Non-severe (moderate) malnutrition in the context of acute illness or injury -Obesity Unspecified   INTERVENTION: Discontinue [employer] - pt is refusing. Provided information on low fat diet for patient when d/c. RD to continue to follow nutrition care plan.  NUTRITION DIAGNOSIS: Inadequate oral intake now related to altered GI function AEB limited oral intake.  Goal: Intake to meet >90% of estimated nutrition needs.  Monitor:  weight trends, lab trends, I/O's, PO intake, supplement tolerance  ASSESSMENT: Pt discussed during multidisciplinary rounds. Pt w/ PMHx of HTN, HLD, CAD, AAA (5.8 cm), Diverticulitis, OA, normocytic anemia, and tobacco abuse, presents to the ED w/ complaints of continued nausea/vomiting. The patient was discharged 06/12/13, after being admitted for the same complaints and treated for diverticulitis vs possible gastroenteritis. Patient claims that when he was discharged, his symptoms were completely resolved and he felt well, tolerating po intake. When he got home, he had some food (sausage, egg, coffee) and says he tolerated that well for some time. Later on in the evening, patient started to feel nauseous once again, and had an episode of NBNB emesis w/ associated abdominal cramping and pain. The patient also admitted to one episode of diarrhea yesterday evening as well, but denies blood or mucus.   Patient with biliary dysfunction. He also has gallstones without elevated LFTs or ductal dilation. HIDA scan showed very poor ejection fraction of gallbladder. Underwent lap chole on 6/6.  Advanced to soft diet. Per chart, if pt is able to tolerate diet, can discharge home today. Pt reported that he was able to tolerate his grits this  morning.  Currently ordered for Soft Diet and Resource Breeze po BID. Refusing [employer].  Height: Ht Readings from Last 1 Encounters:  06/13/13 5' 1.2" (1.554 m)    Weight: Wt Readings from Last 1 Encounters:  06/13/13 191 lb 5.8 oz (86.8 kg)  Admit wt 188 lb  BMI:  Body mass index is 35.94 kg/(m^2). Class II obesity  Estimated Nutritional Needs: Kcal: 1400-1600 Protein: 60-75g Fluid: 1.5L/day  Skin: closed abdomen incision  Diet Order: Criss Rosales   Intake/Output Summary (Last 24 hours) at 06/21/13 0955 Last data filed at 06/21/13 0936  Gross per 24 hour  Intake    440 ml  Output   1225 ml  Net   -785 ml    Last BM: 6/5 per pt  Labs:   Recent Labs Lab 06/18/13 0630 06/19/13 0500 06/20/13 0454  NA 141 138 137  K 4.1 4.0 4.1  CL 104 102 100  CO2 24 23 22   BUN 15 11 8   CREATININE 1.28 1.29 1.31  CALCIUM 9.1 9.0 9.0  GLUCOSE 102* 104* 118*    CBG (last 3)  No results found for this basename: GLUCAP,  in the last 72 hours  Scheduled Meds: . aspirin EC  81 mg Oral Daily  . atorvastatin  20 mg Oral q1800  . feeding supplement (RESOURCE BREEZE)  1 Container Oral BID BM  . heparin  5,000 Units Subcutaneous 3 times per day  . metoprolol tartrate  12.5 mg Oral BID  . nicotine  14 mg Transdermal Daily  . pantoprazole  40 mg Oral Daily  . sodium chloride  3 mL Intravenous Q12H    Continuous Infusions: . dextrose 5 % and  0.9% NaCl 125 mL/hr at 06/20/13 2206    [place], RD, LDN Inpatient Registered Dietitian Pager: [phone] After-hours pager: [phone]

## 2013-06-21 NOTE — Discharge Summary (Signed)
INTERNAL MEDICINE ATTENDING DISCHARGE COSIGN   I evaluated the patient on the day of discharge and discussed the discharge plan with my resident team. I agree with the discharge documentation and disposition.   [name] 06/21/2013, 11:50 AM

## 2013-06-21 NOTE — Progress Notes (Signed)
Patient discharge teaching given, including activity, diet, follow-up appoints, and medications. Patient verbalized understanding of all discharge instructions. IV access was d/c'd. Vitals are stable. Skin is intact except as charted in most recent assessments. Pt to be escorted out by NT, to be driven home by family. 

## 2013-06-21 NOTE — Progress Notes (Addendum)
[facility]  Subjective  - He is going home today.  The nausea has subsided now for 12 plus hours.   Objective 131/73 68 98.3 F (36.8 C) (Oral) 16 94%  Intake/Output Summary (Last 24 hours) at 06/21/13 0747 Last data filed at 06/21/13 0500  Gross per 24 hour  Intake    240 ml  Output   1225 ml  Net   -985 ml   Distally palpable PT/DP pulses Abdomin soft, slight tenderness around the incisions   Assessment/Planning: S/P Lap cholecystectomy--Dr. Ninfa Linden 06/19/13   Plan EVAR repair AAA by Dr. Trula Slade in 1 weeks.  I spoke with Dr. Trula Slade this am.  He is on the OR schedule for Friday.  Some one from our office will call him.   Ulyses Amor 06/21/2013 7:47 AM --  Laboratory Lab Results:  Recent Labs  06/19/13 0500 06/20/13 0454  WBC 2.9* 3.8*  HGB 8.6* 8.6*  HCT 26.0* 26.1*  PLT 137* 145*   BMET  Recent Labs  06/19/13 0500 06/20/13 0454  NA 138 137  K 4.0 4.1  CL 102 100  CO2 23 22  GLUCOSE 104* 118*  BUN 11 8  CREATININE 1.29 1.31  CALCIUM 9.0 9.0    COAG Lab Results  Component Value Date   INR 1.24 06/05/2013   INR 1.66* 10/20/2008   No results found for this basename: PTT

## 2013-06-21 NOTE — Progress Notes (Signed)
CARE MANAGEMENT NOTE 06/21/2013  Patient:  [name], [name]   Account Number:  [id number]  Date Initiated:  06/14/2013  Documentation initiated by:  Tomi Bamberger  Subjective/Objective Assessment:   dx pna  admit- lives with spouse.     Action/Plan:   pt eval-no pt f/u needed.  plan for surgery.   Anticipated DC Date:  06/19/2013   Anticipated DC Plan:  [place]  CM consult      Choice offered to / List presented to:             Status of service:  Completed, signed off Medicare Important Message given?  YES (If response is "NO", the following Medicare IM given date fields will be blank) Date Medicare IM given:  06/16/2013 Date Additional Medicare IM given:  06/21/2013  Discharge Disposition:  HOME/SELF CARE  Per UR Regulation:  Reviewed for med. necessity/level of care/duration of stay  If discussed at [place] of Stay Meetings, dates discussed:    Comments:  06/21/2013 1230 NCM spoke to pt and gave permission to speak to wife. Additional Medicare IM given and placed on chart. No NCM needs identified. Jonnie Finner RN CCM Case Mgmt phone [phone]  06/18/12 [place] RN BSN [phone] patient with cholithiasis, plan is for surgery.  06/14/13 [place], BSN [phone] per physical therapy no f/u therapy needed.

## 2013-06-21 NOTE — Discharge Instructions (Signed)
Laparoscopic Cholecystectomy, Care After Refer to this sheet in the next few weeks. These instructions provide you with information on caring for yourself after your procedure. Your health care provider may also give you more specific instructions. Your treatment has been planned according to current medical practices, but problems sometimes occur. Call your health care provider if you have any problems or questions after your procedure. WHAT TO EXPECT AFTER THE PROCEDURE After your procedure, it is typical to have the following:  Pain at your incision sites. You will be given pain medicines to control the pain.  Mild nausea or vomiting. This should improve after the first 24 hours.  Bloating and possibly shoulder pain from the gas used during the procedure. This will improve after the first 24 hours. HOME CARE INSTRUCTIONS   Change bandages (dressings) as directed by your health care provider.  Keep the wound dry and clean. You may wash the wound gently with soap and water. Gently blot or dab the area dry.  Do not take baths or use swimming pools or hot tubs for 2 weeks or until your health care provider approves.  Only take over-the-counter or prescription medicines as directed by your health care provider.  Continue your normal diet as directed by your health care provider.  Do not lift anything heavier than 10 pounds (4.5 kg) until your health care provider approves.  Do not play contact sports for 1 week or until your health care provider approves. SEEK MEDICAL CARE IF:   You have redness, swelling, or increasing pain in the wound.  You notice yellowish-white fluid (pus) coming from the wound.  You have drainage from the wound that lasts longer than 1 day.  You notice a bad smell coming from the wound or dressing.  Your surgical cuts (incisions) break open. SEEK IMMEDIATE MEDICAL CARE IF:   You develop a rash.  You have difficulty breathing.  You have chest pain.  You  have a fever.  You have increasing pain in the shoulders (shoulder strap areas).  You have dizzy episodes or faint while standing.  You have severe abdominal pain.  You feel sick to your stomach (nauseous) or throw up (vomit) and this lasts for more than 1 day. Document Released: 12/31/2004 Document Revised: 10/21/2012 Document Reviewed: 08/12/2012 [facility] Patient Information 2014 [place].  CCS ______[facility] LAPAROSCOPIC SURGERY: POST OP INSTRUCTIONS Always review your discharge instruction sheet given to you by the facility where your surgery was performed. IF YOU HAVE DISABILITY OR FAMILY LEAVE FORMS, YOU MUST BRING THEM TO THE OFFICE FOR PROCESSING.   DO NOT GIVE THEM TO YOUR DOCTOR.  1. A prescription for pain medication may be given to you upon discharge.  Take your pain medication as prescribed, if needed.  If narcotic pain medicine is not needed, then you may take acetaminophen (Tylenol) or ibuprofen (Advil) as needed. 2. Take your usually prescribed medications unless otherwise directed. 3. If you need a refill on your pain medication, please contact your pharmacy.  They will contact our office to request authorization. Prescriptions will not be filled after 5pm or on week-ends. 4. You should follow a light diet the first few days after arrival home, such as soup and crackers, etc.  Be sure to include lots of fluids daily. 5. Most patients will experience some swelling and bruising in the area of the incisions.  Ice packs will help.  Swelling and bruising can take several days to resolve.  6. It is common  to experience some constipation if taking pain medication after surgery.  Increasing fluid intake and taking a stool softener (such as Colace) will usually help or prevent this problem from occurring.  A mild laxative (Milk of Magnesia or Miralax) should be taken according to package instructions if there are no bowel movements after 48  hours. 7. Unless discharge instructions indicate otherwise, you may remove your bandages 24-48 hours after surgery, and you may shower at that time.  You may have steri-strips (small skin tapes) in place directly over the incision.  These strips should be left on the skin for 7-10 days.  If your surgeon used skin glue on the incision, you may shower in 24 hours.  The glue will flake off over the next 2-3 weeks.  Any sutures or staples will be removed at the office during your follow-up visit. 8. ACTIVITIES:  You may resume regular (light) daily activities beginning the next day--such as daily self-care, walking, climbing stairs--gradually increasing activities as tolerated.  You may have sexual intercourse when it is comfortable.  Refrain from any heavy lifting or straining until approved by your doctor. a. You may drive when you are no longer taking prescription pain medication, you can comfortably wear a seatbelt, and you can safely maneuver your car and apply brakes. b. RETURN TO WORK:  __________________________________________________________ 9. You should see your doctor in the office for a follow-up appointment approximately 2-3 weeks after your surgery.  Make sure that you call for this appointment within a day or two after you arrive home to insure a convenient appointment time. 10. OTHER INSTRUCTIONS: __________________________________________________________________________________________________________________________ __________________________________________________________________________________________________________________________ WHEN TO CALL YOUR DOCTOR: 1. Fever over 101.0 2. Inability to urinate 3. Continued bleeding from incision. 4. Increased pain, redness, or drainage from the incision. 5. Increasing abdominal pain  The clinic staff is available to answer your questions during regular business hours.  Please dont hesitate to call and ask to speak to one of the nurses for  clinical concerns.  If you have a medical emergency, go to the nearest emergency room or call 911.  A surgeon [facility]geCholecystitis Cholecystitis is an inflammation of your gallbladder. It is usually caused by a buildup of gallstones or sludge (cholelithiasis) in your gallbladder. The gallbladder stores a fluid that helps digest fats (bile). Cholecystitis is serious and needs treatment right away.  CAUSES  Gallstones. Gallstones can block the tube that leads to your gallbladder, causing bile to build up. As bile builds up, the gallbladder becomes inflamed. Bile duct problems, such as blockage from scarring or kinking. Tumors. Tumors can stop bile from leaving your gallbladder correctly, causing bile to build up. As bile builds up, the gallbladder becomes inflamed. SYMPTOMS  Nausea. Vomiting. Abdominal pain, especially in the upper right area of your abdomen. Abdominal tenderness or bloating. Sweating. Chills. Fever. Yellowing of the skin and the whites of the eyes (jaundice). DIAGNOSIS  Your caregiver may order blood tests to look for infection or gallbladder problems. Your caregiver may also order imaging tests, such as an ultrasound or computed tomography (CT) scan. Further tests may include a hepatobiliary iminodiacetic acid (HIDA) scan. This scan allows your caregiver to see your bile move from the liver to the gallbladder and to the small intestine. TREATMENT  A hospital stay is usually necessary to lessen the inflammation of your gallbladder. You may be required to not eat or drink (fast) for a certain amount of time. You may be given medicine to treat pain or an  antibiotic medicine to treat an infection. Surgery may be needed to remove your gallbladder (cholecystectomy) once the inflammation has gone down. Surgery may be needed right away if you develop complications such as death of gallbladder tissue (gangrene) or a tear (perforation) of the gallbladder.  [place] care will depend on your treatment. In general: If you were given antibiotics, take them as directed. Finish them even if you start to feel better. Only take over-the-counter or prescription medicines for pain, discomfort, or fever as directed by your caregiver. Follow a low-fat diet until you see your caregiver again. Keep all follow-up visits as directed by your caregiver. SEEK IMMEDIATE MEDICAL CARE IF:  Your pain is increasing and not controlled by medicines. Your pain moves to another part of your abdomen or to your back. You have a fever. You have nausea and vomiting. MAKE SURE YOU: Understand these instructions. Will watch your condition. Will get help right away if you are not doing well or get worse. Document Released: 12/31/2004 Document Revised: 03/25/2011 Document Reviewed: 11/16/2010 [facility] Patient Information 2014 [place], [place]. ry is always on call at the hospital. [address] [place], [place], [place]  [address] ? [address], [place], [place]   [address] [phone] ? [phone] ? FAX [phone] Web site: [email or web address]

## 2013-06-21 NOTE — Progress Notes (Signed)
Subjective: POD2 s/p lap cholecystectomy. Pt feels much better and states he wants to go home. No nausea/vomiting for approx 18 hrs. Tolerating CLD well. Ready to advance diet.   Objective: Vital signs in last 24 hours: Filed Vitals:   06/20/13 0900 06/20/13 1358 06/20/13 2034 06/21/13 0434  BP:  116/74 113/71 131/73  Pulse:  72 69 68  Temp: 98 F (36.7 C) 98.8 F (37.1 C) 99 F (37.2 C) 98.3 F (36.8 C)  TempSrc: Axillary Oral Oral Oral  Resp:  18 16 16   Height:      Weight:      SpO2:  97% 94% 94%   Weight change:   Intake/Output Summary (Last 24 hours) at 06/21/13 0848 Last data filed at 06/21/13 0500  Gross per 24 hour  Intake    240 ml  Output   1225 ml  Net   -985 ml   Physical Exam  General: better spirits today, NAD Neck: supple Lungs: clear to ascultation bilaterally, normal WOB Heart: regular rate and rhythm, no murmurs, gallops, or rubs Abdomen: soft, mild TTP RUQ, otherwise nontender, normal bowel sounds, small surgical scars appear to be healing without complication Extremities: warm, no pedal edema Neurologic: alert & oriented X3, grossly intact  Lab Results: Basic Metabolic Panel:  Recent Labs Lab 06/19/13 0500 06/20/13 0454  NA 138 137  K 4.0 4.1  CL 102 100  CO2 23 22  GLUCOSE 104* 118*  BUN 11 8  CREATININE 1.29 1.31  CALCIUM 9.0 9.0   Liver Function Tests:  Recent Labs Lab 06/18/13 0630  AST 30  ALT 27  ALKPHOS 58  BILITOT 0.6  PROT 7.1  ALBUMIN 3.7   CBC:  Recent Labs Lab 06/18/13 0630 06/19/13 0500 06/20/13 0454  WBC 3.1* 2.9* 3.8*  NEUTROABS 1.6* 1.5*  --   HGB 8.6* 8.6* 8.6*  HCT 26.5* 26.0* 26.1*  MCV 88.9 88.7 88.8  PLT 140* 137* 145*   Cardiac Enzymes:  Recent Labs Lab 06/15/13 1207 06/15/13 1711 06/15/13 2248  TROPONINI <0.30 <0.30 <0.30   Urine Drug Screen: Drugs of Abuse     Component Value Date/Time   LABOPIA NONE DETECTED 06/05/2013 1724   COCAINSCRNUR NONE DETECTED 06/05/2013 1724   LABBENZ NONE DETECTED 06/05/2013 1724   AMPHETMU NONE DETECTED 06/05/2013 1724   THCU NONE DETECTED 06/05/2013 1724   LABBARB NONE DETECTED 06/05/2013 1724    Misc. Labs: Lactate 2.49--> 1.0  Micro Results: Recent Results (from the past 240 hour(s))  CULTURE, BLOOD (ROUTINE X 2)     Status: None   Collection Time    06/13/13  5:52 PM      Result Value Ref Range Status   Specimen Description BLOOD RIGHT ARM   Final   Special Requests BOTTLES DRAWN AEROBIC AND ANAEROBIC 10CC EACH   Final   Culture  Setup Time     Final   Value: 06/14/2013 01:15     Performed at [facility]   Culture     Final   Value: NO GROWTH 5 DAYS     Performed at [facility]   Report Status 06/20/2013 FINAL   Final  CULTURE, BLOOD (ROUTINE X 2)     Status: None   Collection Time    06/13/13  6:05 PM      Result Value Ref Range Status   Specimen Description BLOOD RIGHT HAND   Final   Special Requests BOTTLES DRAWN AEROBIC AND ANAEROBIC [place]  Final   Culture  Setup Time     Final   Value: 06/14/2013 01:15     Performed at [facility]   Culture     Final   Value: NO GROWTH 5 DAYS     Performed at [facility]   Report Status 06/20/2013 FINAL   Final  CLOSTRIDIUM DIFFICILE BY PCR     Status: None   Collection Time    06/14/13 11:55 AM      Result Value Ref Range Status   C difficile by pcr NEGATIVE  NEGATIVE Final  SURGICAL PCR SCREEN     Status: None   Collection Time    06/18/13  6:30 PM      Result Value Ref Range Status   MRSA, PCR NEGATIVE  NEGATIVE Final   Staphylococcus aureus NEGATIVE  NEGATIVE Final   Comment:            The Xpert SA Assay (FDA     approved for NASAL specimens     in patients over 75 years of age),     is one component of     a comprehensive surveillance     program.  Test performance has     been validated by [employer] for patients greater     than or equal to 39 year old.     It is not intended     to diagnose infection nor  to     guide or monitor treatment.   Medications: I have reviewed the patient's current medications. Scheduled Meds: . aspirin EC  81 mg Oral Daily  . atorvastatin  20 mg Oral q1800  . feeding supplement (RESOURCE BREEZE)  1 Container Oral BID BM  . heparin  5,000 Units Subcutaneous 3 times per day  . metoprolol tartrate  12.5 mg Oral BID  . nicotine  14 mg Transdermal Daily  . pantoprazole  40 mg Oral Daily  . sodium chloride  3 mL Intravenous Q12H   Continuous Infusions: . dextrose 5 % and 0.9% NaCl 125 mL/hr at 06/20/13 2206   PRN Meds:.gi cocktail, HYDROmorphone (DILAUDID) injection, LORazepam, metoCLOPramide (REGLAN) injection, nitroGLYCERIN, ondansetron (ZOFRAN) IV, oxyCODONE, promethazine  Assessment/Plan:  Chronic cholecystitis w/ cholelithiasis s/p cholecystectomy, POD #2-  Patient feeling much better today. VSS. No N/V overnight. Tolerating CLD and requesting diet to be advanced this morning. Patient wants to go home today. Will plan to see if he tolerates HH diet at breakfast first, but tentatively plan for discharge today. -surgery following -IV dilaudid 1 mg q2 hrs prn -Oxycodone 5-10 mg q4h prn -GI consult appreciate recs -zofran prn -HH diet today -mobilize out of bed  AAA-  CTA chest 06/07/13 showed incidental 5.8 cm fusiform infrarenal abdominal aortic aneurysm without evidence of rupture or other complicating features. Vascular surgery consultation was recommended due to increased risk of rupture for AAA >5.5 cm.  Vascular surgery is following, now that pt feeling better will see patient back in the office within the next 2 weeks to schedule AAA repair.  HTN- Patient normotensive. On Metoprolol 12.5 bid at home.  -Continue Metoprolol   CAD- Stable.  -Continue ASA + Lipitor + Metoprolol  -NTG prn   HLD- On Rosuvastatin 10 mg qhs at home.  -Continue statin  Tobacco use disorder: ~30 pack year history. Likely contributed to AAA above. He has quit successfully  in the past for 8 months, though was still smoking up until his hospital admission  last week.  -Nicotine patch prn; withhold for now in the setting of nausea/vomtiing   DVT/PE PPx- Heparin [place]  Diet- HH diet Code-  full  Dispo: Discharge today.  The patient does have a current PCP Sherren Mocha, MD) and does not need an [facility] hospital follow-up appointment after discharge.  The patient does not have transportation limitations that hinder transportation to clinic appointments.  .Services Needed at time of discharge: Y = Yes, Blank = No PT:   OT:   RN:   Equipment:   Other:     LOS: 8 days   Rebecca Eaton, MD 06/21/2013, 8:48 AM

## 2013-06-21 NOTE — Progress Notes (Signed)
2 Days Post-Op  Subjective: No more vomiting passing gas.  Feels better.   Objective: Vital signs in last 24 hours: Temp:  [98 F (36.7 C)-99 F (37.2 C)] 98.3 F (36.8 C) (06/08 0434) Pulse Rate:  [68-72] 68 (06/08 0434) Resp:  [16-18] 16 (06/08 0434) BP: (113-131)/(71-74) 131/73 mmHg (06/08 0434) SpO2:  [94 %-97 %] 94 % (06/08 0434) Last BM Date: 06/18/13  Intake/Output from previous day: 06/07 0701 - 06/08 0700 In: 240 [P.O.:240] Out: 1225 [Urine:1225] Intake/Output this shift:    Incision/Wound:C/D/I soft mild distension but soft   Lab Results:   Recent Labs  06/19/13 0500 06/20/13 0454  WBC 2.9* 3.8*  HGB 8.6* 8.6*  HCT 26.0* 26.1*  PLT 137* 145*   BMET  Recent Labs  06/19/13 0500 06/20/13 0454  NA 138 137  K 4.0 4.1  CL 102 100  CO2 23 22  GLUCOSE 104* 118*  BUN 11 8  CREATININE 1.29 1.31  CALCIUM 9.0 9.0   PT/INR No results found for this basename: LABPROT, INR,  in the last 72 hours ABG No results found for this basename: PHART, PCO2, PO2, HCO3,  in the last 72 hours  Studies/Results: No results found.  Anti-infectives: Anti-infectives   Start     Dose/Rate Route Frequency Ordered Stop   06/18/13 1430  piperacillin-tazobactam (ZOSYN) IVPB 3.375 g  Status:  Discontinued     3.375 g 12.5 mL/hr over 240 Minutes Intravenous 3 times per day 06/18/13 1348 06/20/13 0852   06/14/13 0900  vancomycin (VANCOCIN) IVPB 750 mg/150 ml premix  Status:  Discontinued     750 mg 150 mL/hr over 60 Minutes Intravenous Every 12 hours 06/13/13 2005 06/14/13 0652   06/14/13 0730  cephALEXin (KEFLEX) capsule 500 mg  Status:  Discontinued     500 mg Oral 4 times per day 06/14/13 0701 06/15/13 1052   06/13/13 2200  ceFEPIme (MAXIPIME) 1 g in dextrose 5 % 50 mL IVPB  Status:  Discontinued     1 g 100 mL/hr over 30 Minutes Intravenous 3 times per day 06/13/13 1957 06/14/13 0652   06/13/13 1800  vancomycin (VANCOCIN) 1,500 mg in sodium chloride 0.9 % 500 mL IVPB      1,500 mg 250 mL/hr over 120 Minutes Intravenous  Once 06/13/13 1735 06/13/13 2105   06/13/13 1745  piperacillin-tazobactam (ZOSYN) IVPB 3.375 g     3.375 g 100 mL/hr over 30 Minutes Intravenous  Once 06/13/13 1735 06/13/13 1818      Assessment/Plan: s/p Procedure(s): LAPAROSCOPIC CHOLECYSTECTOMY (N/A) Feed him  If he tolerates diet he can be discharged.   LOS: 8 days    Freeland Pracht A. Aristotelis Vilardi 06/21/2013

## 2013-06-21 NOTE — Progress Notes (Signed)
Pt vomited medium amount of orange emesis.

## 2013-06-21 NOTE — Progress Notes (Signed)
Pt seen and examined. No further episodes of nausea, vomiting. Patient is now s/p lap chole POD*2. C/o mild "soreness" at surgical site  Exam:  Lungs- CTA b/l  Abd- soft, mild tenderness to palpation umbilical and RUQ, non distended, Bowel sounds normoactive  Cardio- regular rate/rhythm, normal heart sounds  Ext- no LE edema  Gen- AAO*3, NAD   Assessment and Plan:   Nausea and vomiting likely chronic cholecystitis  - Pt now s/p lap cholecystectomy POD#2 - Symptoms have now resolved. Will monitor - Tolerating liquids. Diet advanced to soft. - If tolerating PO will d/c home today  - Surgery recommendations appreciated - c/w protonix   Antecubital fossa cellulitis  - Resolved   AAA  - Stable on CT done on this admission  - Will clarify with vascular surgery as to timing of AAA repair and follow up  CAD/HTN  -c/w asa, metoprolol, statin   Pt stable for d/c home today if tolerates soft diet Case d/w patient in detail. Case d/w residents

## 2013-06-21 NOTE — Progress Notes (Signed)
I agree with the above.  I discussed with the family to laying aneurysm repair until his acute issues have resolved.  I will continue to check back on him periodically.  I did discuss this with the primary medical team.  Annamarie Major

## 2013-06-22 ENCOUNTER — Encounter (HOSPITAL_COMMUNITY): Payer: Self-pay | Admitting: Surgery

## 2013-06-22 ENCOUNTER — Other Ambulatory Visit: Payer: Self-pay

## 2013-06-23 ENCOUNTER — Telehealth: Payer: Self-pay | Admitting: Cardiovascular Disease

## 2013-06-23 NOTE — Telephone Encounter (Signed)
New message     Came home from gallbladder surgery Monday----blood pressure is 118/79 pulse 110.  Pulse is usually in the 50's.  Only medication pt is on is metoprolol.  Can wife give him a lisinopril pill?  She ask someone call back asap.  She is worried about his high pulse rate

## 2013-06-23 NOTE — ED Provider Notes (Signed)
I have personally seen and examined the patient.  I have discussed plan of care with resident.  I was present for key and critical portions of procedure as documented. I have reviewed the appropriate documentation on PMH/FH/Soc. History.  I have reviewed the documentation of the resident and agree.     Sharyon Cable, MD 06/23/13 [phone]

## 2013-06-23 NOTE — Telephone Encounter (Signed)
SPOKE  WITH   PT  RE MESSAGE PER PT    HAD  BEEN  WALKING  AROUND  OUTSIDE  FOR  A PERIOD OF  TIME   AND  HEART  RATE  WAS  AS  STATED   ELEVATED  BUT  WITH ACTIVITY   ALSO  PT  STATES  RESTARTED   NICOTINE PATCH  MAY  ALSO HAVE  AFFECT  ON HEART  RATE  ENCOURAGED  TO CONT TO MONITOR   AND   IF  CONTINUES  TO REMAIN ELEVATED  TO CALL BACK PT   [email or web address]

## 2013-06-24 ENCOUNTER — Other Ambulatory Visit (INDEPENDENT_AMBULATORY_CARE_PROVIDER_SITE_OTHER): Payer: Self-pay | Admitting: General Surgery

## 2013-06-24 ENCOUNTER — Telehealth (INDEPENDENT_AMBULATORY_CARE_PROVIDER_SITE_OTHER): Payer: Self-pay | Admitting: General Surgery

## 2013-06-24 ENCOUNTER — Telehealth: Payer: Self-pay | Admitting: Internal Medicine

## 2013-06-24 DIAGNOSIS — K219 Gastro-esophageal reflux disease without esophagitis: Secondary | ICD-10-CM

## 2013-06-24 MED ORDER — METOCLOPRAMIDE HCL 10 MG PO TABS: 10.0000 mg | ORAL_TABLET | Freq: Four times a day (QID) | ORAL | Status: DC

## 2013-06-24 MED ORDER — ONDANSETRON 4 MG PO TBDP: 4.0000 mg | ORAL_TABLET | Freq: Four times a day (QID) | ORAL | Status: DC | PRN

## 2013-06-24 NOTE — Telephone Encounter (Signed)
Pts wife called back asking if anything can help the reflux. She is aware Pamala Hurry is sending rx for Zofran. She states pt has upcoming AA Stent 6-19 and pt is still not able to tolerate solid food. I reviewed this concern with Dr Ninfa Linden. Per his request Reglan will be sent to pharmacy to assist with reflux and if pt not improving pt should follow up with his pcp. Wife states she understands.

## 2013-06-24 NOTE — Telephone Encounter (Signed)
Pt's wife called to discuss ongoing nausea.  Husband had GB out over weekend and still battling nausea; eating very little secondary to that.  Discussed plan to eat simple carbohydrates, starting 20-30 minutes after taking Zofran.  Sips of broth, tea or Gatorade.  Avoid all proteins until nausea is resolved.  Will call in, per standing orders, Zofran ODT 4 mg, #15, 1 po Q6H prn nausea, no refill.

## 2013-06-24 NOTE — Telephone Encounter (Signed)
Spoke with patient's wife and she states he is having problems with severe nausea. She states she has called the Psychologist, sport and exercise. He is sipping fluids but unable to eat much. Offered OV with extender but patient wants to try the Reglan and Zofran from surgeon first. She will call back if needed.

## 2013-06-24 NOTE — Addendum Note (Signed)
Addended by: Dois Davenport on: 06/24/2013 09:13 AM   Modules accepted: Orders

## 2013-06-28 ENCOUNTER — Telehealth: Payer: Self-pay | Admitting: Internal Medicine

## 2013-06-28 ENCOUNTER — Telehealth (INDEPENDENT_AMBULATORY_CARE_PROVIDER_SITE_OTHER): Payer: Self-pay

## 2013-06-28 NOTE — Telephone Encounter (Signed)
The gallbladder pathology showed chronic inflammation in the gallbladder and actually some gallstones.  You can tell them the path  He needs to see gastroenterology (Dr. Olevia Perches) who he saw in the hospital.  Nothing really further to offer from surgery standpoint

## 2013-06-28 NOTE — Telephone Encounter (Signed)
Informed pt of gallbladder path. Informed pt of Dr Trevor Mace recommendation of calling Dr Olevia Perches, pt states they have called their office just waiting to hear back from them as well. Pt verbalized understanding.

## 2013-06-28 NOTE — Telephone Encounter (Signed)
Pt states that he has not been taking the Reglan as scheduled. Informed pt that Reglan is directed to take 4 times daily. Pt will call back if nausea continues.

## 2013-06-28 NOTE — Telephone Encounter (Signed)
Patient recently discharged from hospital after having gallbladder removed. He was better when discharged. He reports he had 2 good days then started back with nausea and vomiting. He vomited 3 times yesterday and was unable to eat. No vomiting today just nausea. Denies fever. He is using Zofran with some relief. He is also on a Nicotine patch. He is asking if Nicotine patch could cause nausea. Please, advise.

## 2013-06-28 NOTE — Telephone Encounter (Signed)
Pt states that she has spoke with Dr Olevia Perches and they have informed her to call our office back if the nausea and vomiting continues. Pt states

## 2013-06-28 NOTE — Telephone Encounter (Signed)
Patient notified of recommendations. 

## 2013-06-28 NOTE — Telephone Encounter (Signed)
Pt s/p lap chole 06/22/13 with Dr Ninfa Linden. Pt's wife states that he has only had 2 good days since surgery 2 good days then started back with nausea and vomiting .  He vomited 3 times yesterday and was unable to eat. No vomiting today just nausea. Denies fever. He is using Zofran and Reglan with some relief. He is also on a Nicotine patch. He is asking if the  Nicotine patch could causing nausea. He normally smoke a pack a day. Pt's wife was also asking about the path of his gallbladder. Pt denies any fevers or chills. Denies any redness, or swelling at the incision site.  Advised pt that I would send Dr Ninfa Linden for further recommendations.

## 2013-06-28 NOTE — Telephone Encounter (Signed)
Nicotine patch should not cause nausea. Zofran is OK to take. PPI daily.He should call the surgeons if vomiting continues ,to make sure there is no postoperative complication

## 2013-06-29 ENCOUNTER — Telehealth (INDEPENDENT_AMBULATORY_CARE_PROVIDER_SITE_OTHER): Payer: Self-pay

## 2013-06-29 ENCOUNTER — Telehealth: Payer: Self-pay | Admitting: Internal Medicine

## 2013-06-29 MED ORDER — SUCRALFATE 1 GM/10ML PO SUSP: ORAL | Status: DC

## 2013-06-29 NOTE — Telephone Encounter (Signed)
Please add Carafate slurry 10cc po bid for bile gastritis. Disp 12 oz, 1 refill.

## 2013-06-29 NOTE — Telephone Encounter (Signed)
Spoke with patient's wife and he had his gall bladder removed last Friday. He is continuing to have nausea and abdominal cramping. He called the surgeon and was told to call GI. Denies fever. He is taking Zofran and Reglan regularly but nausea continues, He was able to eat bland foods yesterday but has had more nausea today. He does report having a bowel movement yesterday but was a little constipated today. He is using a stool softener. Scheduled with Tye Savoy, NP on 07/09/13 at 2:30 PM.

## 2013-06-29 NOTE — Telephone Encounter (Signed)
Spoke with patient's wife and she will have the insurance company fax the necessary form to [place].

## 2013-06-29 NOTE — Telephone Encounter (Signed)
Wife is asking for P/O appt , no opening with  DR. Blackman in the next couple weeks please advise

## 2013-06-29 NOTE — Telephone Encounter (Signed)
Rx sent. Patient's wife aware.

## 2013-06-30 ENCOUNTER — Encounter (HOSPITAL_COMMUNITY): Payer: Self-pay | Admitting: Pharmacy Technician

## 2013-06-30 ENCOUNTER — Ambulatory Visit: Payer: Medicare Other | Admitting: Internal Medicine

## 2013-06-30 NOTE — Telephone Encounter (Signed)
carafate rx was approved through [employer] until 06/30/14

## 2013-07-01 ENCOUNTER — Encounter (HOSPITAL_COMMUNITY): Payer: Self-pay | Admitting: *Deleted

## 2013-07-01 MED ORDER — DEXTROSE 5 % IV SOLN
1.5000 g | INTRAVENOUS | Status: AC
  Administered 2013-07-02 (×2): 1.5 g via INTRAVENOUS
  Filled 2013-07-01: qty 1.5

## 2013-07-01 NOTE — Progress Notes (Signed)
07/01/13 1704  OBSTRUCTIVE SLEEP APNEA  Have you ever been diagnosed with sleep apnea through a sleep study? No  Do you snore loudly (loud enough to be heard through closed doors)?  0  Do you often feel tired, fatigued, or sleepy during the daytime? 0  Has anyone observed you stop breathing during your sleep? 0  Do you have, or are you being treated for high blood pressure? 1  BMI more than 35 kg/m2? 1  Age over 67 years old? 1  Gender: 1  Obstructive Sleep Apnea Score 4

## 2013-07-01 NOTE — Progress Notes (Signed)
Pt stated that he had an intolerance to Cephalexin; pt c/o nausea and stomach cramps from the antibiotic. Info updated in epic. Pt PCP is Dr. Shirline Frees of [facility].

## 2013-07-02 ENCOUNTER — Encounter (HOSPITAL_COMMUNITY): Payer: Self-pay | Admitting: Surgery

## 2013-07-02 ENCOUNTER — Inpatient Hospital Stay (HOSPITAL_COMMUNITY)
Admission: RE | Admit: 2013-07-02 | Discharge: 2013-07-03 | DRG: 238 | Disposition: A | Discharge status: Home / Self Care | Payer: Medicare Other | Source: Ambulatory Visit | Attending: Surgery | Admitting: Surgery

## 2013-07-02 ENCOUNTER — Inpatient Hospital Stay (HOSPITAL_COMMUNITY): Payer: Medicare Other

## 2013-07-02 ENCOUNTER — Encounter (HOSPITAL_COMMUNITY)
Admission: RE | Disposition: A | Discharge status: Home / Self Care | Payer: Self-pay | Source: Ambulatory Visit | Attending: Surgery

## 2013-07-02 ENCOUNTER — Encounter (HOSPITAL_COMMUNITY): Payer: Medicare Other | Admitting: Certified Registered Nurse Anesthetist

## 2013-07-02 ENCOUNTER — Ambulatory Visit: Payer: Medicare Other | Admitting: Nurse Practitioner

## 2013-07-02 ENCOUNTER — Inpatient Hospital Stay (HOSPITAL_COMMUNITY): Payer: Medicare Other | Admitting: Certified Registered Nurse Anesthetist

## 2013-07-02 ENCOUNTER — Encounter (INDEPENDENT_AMBULATORY_CARE_PROVIDER_SITE_OTHER): Payer: Medicare Other | Admitting: Surgery

## 2013-07-02 DIAGNOSIS — I714 Abdominal aortic aneurysm, without rupture, unspecified: Principal | ICD-10-CM | POA: Diagnosis present

## 2013-07-02 DIAGNOSIS — Z87891 Personal history of nicotine dependence: Secondary | ICD-10-CM

## 2013-07-02 DIAGNOSIS — I2510 Atherosclerotic heart disease of native coronary artery without angina pectoris: Secondary | ICD-10-CM | POA: Diagnosis present

## 2013-07-02 DIAGNOSIS — J449 Chronic obstructive pulmonary disease, unspecified: Secondary | ICD-10-CM | POA: Diagnosis present

## 2013-07-02 DIAGNOSIS — Z9861 Coronary angioplasty status: Secondary | ICD-10-CM

## 2013-07-02 DIAGNOSIS — I10 Essential (primary) hypertension: Secondary | ICD-10-CM | POA: Diagnosis present

## 2013-07-02 DIAGNOSIS — I252 Old myocardial infarction: Secondary | ICD-10-CM

## 2013-07-02 DIAGNOSIS — Z7982 Long term (current) use of aspirin: Secondary | ICD-10-CM

## 2013-07-02 DIAGNOSIS — E7800 Pure hypercholesterolemia, unspecified: Secondary | ICD-10-CM | POA: Diagnosis present

## 2013-07-02 DIAGNOSIS — J4489 Other specified chronic obstructive pulmonary disease: Secondary | ICD-10-CM | POA: Diagnosis present

## 2013-07-02 DIAGNOSIS — Z79899 Other long term (current) drug therapy: Secondary | ICD-10-CM

## 2013-07-02 DIAGNOSIS — IMO0002 Reserved for concepts with insufficient information to code with codable children: Secondary | ICD-10-CM

## 2013-07-02 HISTORY — PX: ABDOMINAL AORTIC ENDOVASCULAR STENT GRAFT: SHX5707

## 2013-07-02 HISTORY — DX: Other specified postprocedural states: Z98.890

## 2013-07-02 HISTORY — DX: Nausea with vomiting, unspecified: R11.2

## 2013-07-02 LAB — BLOOD GAS, ARTERIAL
ACID-BASE EXCESS: 0 mmol/L (ref 0.0–2.0)
Bicarbonate: 23.8 mEq/L (ref 20.0–24.0)
DRAWN BY: 20636
FIO2: 0.21 %
O2 SAT: 97.8 %
PO2 ART: 74.7 mmHg — AB (ref 80.0–100.0)
Patient temperature: 98.6
TCO2: 25 mmol/L (ref 0–100)
pCO2 arterial: 36.8 mmHg (ref 35.0–45.0)
pH, Arterial: 7.427 (ref 7.350–7.450)

## 2013-07-02 LAB — CBC
HEMATOCRIT: 19.7 % — AB (ref 39.0–52.0)
HEMATOCRIT: 18.6 % — AB (ref 39.0–52.0)
HEMOGLOBIN: 6.5 g/dL — AB (ref 13.0–17.0)
Hemoglobin: 6.1 g/dL — CL (ref 13.0–17.0)
MCH: 28.8 pg (ref 26.0–34.0)
MCH: 28.6 pg (ref 26.0–34.0)
MCHC: 32.8 g/dL (ref 30.0–36.0)
MCHC: 33 g/dL (ref 30.0–36.0)
MCV: 87.2 fL (ref 78.0–100.0)
MCV: 87.3 fL (ref 78.0–100.0)
PLATELETS: 90 10*3/uL — AB (ref 150–400)
Platelets: 91 10*3/uL — ABNORMAL LOW (ref 150–400)
RBC: 2.26 MIL/uL — AB (ref 4.22–5.81)
RBC: 2.13 MIL/uL — AB (ref 4.22–5.81)
RDW: 19.7 % — ABNORMAL HIGH (ref 11.5–15.5)
RDW: 19.8 % — ABNORMAL HIGH (ref 11.5–15.5)
WBC: 2.3 10*3/uL — AB (ref 4.0–10.5)
WBC: 2.6 10*3/uL — ABNORMAL LOW (ref 4.0–10.5)

## 2013-07-02 LAB — PROTIME-INR
INR: 1.2 (ref 0.00–1.49)
INR: 1.34 (ref 0.00–1.49)
Prothrombin Time: 14.9 seconds (ref 11.6–15.2)
Prothrombin Time: 16.3 seconds — ABNORMAL HIGH (ref 11.6–15.2)

## 2013-07-02 LAB — CREATININE, SERUM
Creatinine, Ser: 1.1 mg/dL (ref 0.50–1.35)
GFR calc Af Amer: 79 mL/min — ABNORMAL LOW (ref >90)
GFR, EST NON AFRICAN AMERICAN: 68 mL/min — AB (ref >90)

## 2013-07-02 LAB — POCT I-STAT, CHEM 8
BUN: 14 mg/dL (ref 6–23)
CALCIUM ION: 1.28 mmol/L (ref 1.13–1.30)
Chloride: 99 mEq/L (ref 96–112)
Creatinine, Ser: 1.2 mg/dL (ref 0.50–1.35)
GLUCOSE: 114 mg/dL — AB (ref 70–99)
HEMATOCRIT: 28 % — AB (ref 39.0–52.0)
HEMOGLOBIN: 9.5 g/dL — AB (ref 13.0–17.0)
Potassium: 3.3 mEq/L — ABNORMAL LOW (ref 3.7–5.3)
Sodium: 141 mEq/L (ref 137–147)
TCO2: 24 mmol/L (ref 0–100)

## 2013-07-02 LAB — BASIC METABOLIC PANEL
BUN: 14 mg/dL (ref 6–23)
CO2: 23 meq/L (ref 19–32)
CREATININE: 1.11 mg/dL (ref 0.50–1.35)
Calcium: 8.8 mg/dL (ref 8.4–10.5)
Chloride: 99 mEq/L (ref 96–112)
GFR calc Af Amer: 78 mL/min — ABNORMAL LOW (ref >90)
GFR calc non Af Amer: 67 mL/min — ABNORMAL LOW (ref >90)
GLUCOSE: 133 mg/dL — AB (ref 70–99)
Potassium: 3.9 mEq/L (ref 3.7–5.3)
Sodium: 137 mEq/L (ref 137–147)

## 2013-07-02 LAB — MAGNESIUM: Magnesium: 1.7 mg/dL (ref 1.5–2.5)

## 2013-07-02 LAB — PREPARE RBC (CROSSMATCH)

## 2013-07-02 LAB — APTT
aPTT: 40 seconds — ABNORMAL HIGH (ref 24–37)
aPTT: 37 seconds (ref 24–37)

## 2013-07-02 LAB — ABO/RH: ABO/RH(D): A POS

## 2013-07-02 SURGERY — INSERTION, ENDOVASCULAR STENT GRAFT, AORTA, ABDOMINAL
Anesthesia: General | Site: Abdomen

## 2013-07-02 MED ORDER — PROPOFOL 10 MG/ML IV BOLUS
INTRAVENOUS | Status: AC
  Filled 2013-07-02: qty 20

## 2013-07-02 MED ORDER — DOPAMINE-DEXTROSE 3.2-5 MG/ML-% IV SOLN: 3.0000 ug/kg/min | INTRAVENOUS | Status: DC

## 2013-07-02 MED ORDER — METOCLOPRAMIDE HCL 10 MG PO TABS
10.0000 mg | ORAL_TABLET | Freq: Four times a day (QID) | ORAL | Status: DC
  Administered 2013-07-02 – 2013-07-03 (×4): 10 mg via ORAL
  Filled 2013-07-02 (×8): qty 1

## 2013-07-02 MED ORDER — NITROGLYCERIN 0.4 MG SL SUBL: 0.4000 mg | SUBLINGUAL_TABLET | SUBLINGUAL | Status: DC | PRN

## 2013-07-02 MED ORDER — PROPOFOL 10 MG/ML IV BOLUS
INTRAVENOUS | Status: DC | PRN
  Administered 2013-07-02: 130 mg via INTRAVENOUS

## 2013-07-02 MED ORDER — VANCOMYCIN HCL IN DEXTROSE 1-5 GM/200ML-% IV SOLN
1000.0000 mg | Freq: Two times a day (BID) | INTRAVENOUS | Status: AC
  Administered 2013-07-02 – 2013-07-03 (×2): 1000 mg via INTRAVENOUS
  Filled 2013-07-02 (×2): qty 200

## 2013-07-02 MED ORDER — SENNOSIDES-DOCUSATE SODIUM 8.6-50 MG PO TABS
1.0000 | ORAL_TABLET | Freq: Every evening | ORAL | Status: DC | PRN
  Filled 2013-07-02: qty 1

## 2013-07-02 MED ORDER — SODIUM CHLORIDE 0.9 % IV SOLN: INTRAVENOUS | Status: DC

## 2013-07-02 MED ORDER — OXYCODONE-ACETAMINOPHEN 5-325 MG PO TABS: 1.0000 | ORAL_TABLET | Freq: Four times a day (QID) | ORAL | Status: DC | PRN

## 2013-07-02 MED ORDER — HYDRALAZINE HCL 20 MG/ML IJ SOLN: 10.0000 mg | INTRAMUSCULAR | Status: DC | PRN

## 2013-07-02 MED ORDER — ATROPINE SULFATE 0.1 MG/ML IJ SOLN
INTRAMUSCULAR | Status: AC
  Filled 2013-07-02: qty 10

## 2013-07-02 MED ORDER — EPHEDRINE SULFATE 50 MG/ML IJ SOLN
INTRAMUSCULAR | Status: AC
  Filled 2013-07-02: qty 1

## 2013-07-02 MED ORDER — FENTANYL CITRATE 0.05 MG/ML IJ SOLN
INTRAMUSCULAR | Status: AC
  Filled 2013-07-02: qty 5

## 2013-07-02 MED ORDER — PROTAMINE SULFATE 10 MG/ML IV SOLN
INTRAVENOUS | Status: DC | PRN
  Administered 2013-07-02: 10 mg via INTRAVENOUS
  Administered 2013-07-02: 40 mg via INTRAVENOUS

## 2013-07-02 MED ORDER — LIDOCAINE HCL (CARDIAC) 20 MG/ML IV SOLN
INTRAVENOUS | Status: AC
  Filled 2013-07-02: qty 5

## 2013-07-02 MED ORDER — NEOSTIGMINE METHYLSULFATE 10 MG/10ML IV SOLN
INTRAVENOUS | Status: AC
  Filled 2013-07-02: qty 1

## 2013-07-02 MED ORDER — ATORVASTATIN CALCIUM 20 MG PO TABS
20.0000 mg | ORAL_TABLET | Freq: Every day | ORAL | Status: DC
  Administered 2013-07-02: 20 mg via ORAL
  Filled 2013-07-02 (×2): qty 1

## 2013-07-02 MED ORDER — ONDANSETRON HCL 4 MG/2ML IJ SOLN
INTRAMUSCULAR | Status: AC
  Filled 2013-07-02: qty 2

## 2013-07-02 MED ORDER — HYDROMORPHONE HCL PF 1 MG/ML IJ SOLN: 0.2500 mg | INTRAMUSCULAR | Status: DC | PRN

## 2013-07-02 MED ORDER — MIDAZOLAM HCL 2 MG/2ML IJ SOLN
INTRAMUSCULAR | Status: AC
  Filled 2013-07-02: qty 2

## 2013-07-02 MED ORDER — GUAIFENESIN-DM 100-10 MG/5ML PO SYRP: 15.0000 mL | ORAL_SOLUTION | ORAL | Status: DC | PRN

## 2013-07-02 MED ORDER — NICOTINE 14 MG/24HR TD PT24
14.0000 mg | MEDICATED_PATCH | Freq: Every day | TRANSDERMAL | Status: DC
  Administered 2013-07-02 – 2013-07-03 (×2): 14 mg via TRANSDERMAL
  Filled 2013-07-02 (×2): qty 1

## 2013-07-02 MED ORDER — MAGNESIUM SULFATE 40 MG/ML IJ SOLN: 2.0000 g | Freq: Every day | INTRAMUSCULAR | Status: DC | PRN

## 2013-07-02 MED ORDER — HEPARIN SODIUM (PORCINE) 1000 UNIT/ML IJ SOLN
INTRAMUSCULAR | Status: AC
  Filled 2013-07-02: qty 1

## 2013-07-02 MED ORDER — ACETAMINOPHEN 325 MG PO TABS: 325.0000 mg | ORAL_TABLET | ORAL | Status: DC | PRN

## 2013-07-02 MED ORDER — FENTANYL CITRATE 0.05 MG/ML IJ SOLN
INTRAMUSCULAR | Status: DC | PRN
Start: 2013-07-02 — End: 2013-07-02
  Administered 2013-07-02: 100 ug via INTRAVENOUS

## 2013-07-02 MED ORDER — SODIUM CHLORIDE 0.9 % IV SOLN: 500.0000 mL | Freq: Once | INTRAVENOUS | Status: AC | PRN

## 2013-07-02 MED ORDER — ROCURONIUM BROMIDE 100 MG/10ML IV SOLN
INTRAVENOUS | Status: DC | PRN
  Administered 2013-07-02 (×2): 10 mg via INTRAVENOUS
  Administered 2013-07-02: 40 mg via INTRAVENOUS
  Administered 2013-07-02 (×2): 10 mg via INTRAVENOUS

## 2013-07-02 MED ORDER — PHENYLEPHRINE HCL 10 MG/ML IJ SOLN
10.0000 mg | INTRAVENOUS | Status: DC | PRN
  Administered 2013-07-02: 15 ug/min via INTRAVENOUS

## 2013-07-02 MED ORDER — MIDAZOLAM HCL 5 MG/5ML IJ SOLN
INTRAMUSCULAR | Status: DC | PRN
  Administered 2013-07-02: 2 mg via INTRAVENOUS

## 2013-07-02 MED ORDER — ONDANSETRON HCL 4 MG PO TABS: 4.0000 mg | ORAL_TABLET | Freq: Three times a day (TID) | ORAL | Status: DC | PRN

## 2013-07-02 MED ORDER — OXYCODONE HCL 5 MG PO TABS
5.0000 mg | ORAL_TABLET | ORAL | Status: DC | PRN
  Administered 2013-07-03: 10 mg via ORAL
  Filled 2013-07-02: qty 2

## 2013-07-02 MED ORDER — ROCURONIUM BROMIDE 50 MG/5ML IV SOLN
INTRAVENOUS | Status: AC
  Filled 2013-07-02: qty 1

## 2013-07-02 MED ORDER — SODIUM CHLORIDE 0.9 % IJ SOLN
INTRAMUSCULAR | Status: AC
  Filled 2013-07-02: qty 10

## 2013-07-02 MED ORDER — LIDOCAINE HCL (CARDIAC) 20 MG/ML IV SOLN
INTRAVENOUS | Status: DC | PRN
  Administered 2013-07-02: 100 mg via INTRAVENOUS
  Administered 2013-07-02: 80 mg via INTRATRACHEAL

## 2013-07-02 MED ORDER — DOCUSATE SODIUM 100 MG PO CAPS
100.0000 mg | ORAL_CAPSULE | Freq: Every day | ORAL | Status: DC
  Administered 2013-07-03: 100 mg via ORAL
  Filled 2013-07-02: qty 1

## 2013-07-02 MED ORDER — ENOXAPARIN SODIUM 40 MG/0.4ML ~~LOC~~ SOLN
40.0000 mg | SUBCUTANEOUS | Status: DC
  Administered 2013-07-03: 40 mg via SUBCUTANEOUS
  Filled 2013-07-02 (×2): qty 0.4

## 2013-07-02 MED ORDER — GLYCOPYRROLATE 0.2 MG/ML IJ SOLN
INTRAMUSCULAR | Status: AC
  Filled 2013-07-02: qty 1

## 2013-07-02 MED ORDER — ONDANSETRON 4 MG PO TBDP
4.0000 mg | ORAL_TABLET | Freq: Four times a day (QID) | ORAL | Status: DC | PRN
  Filled 2013-07-02: qty 1

## 2013-07-02 MED ORDER — PHENYLEPHRINE 40 MCG/ML (10ML) SYRINGE FOR IV PUSH (FOR BLOOD PRESSURE SUPPORT)
PREFILLED_SYRINGE | INTRAVENOUS | Status: AC
  Filled 2013-07-02: qty 10

## 2013-07-02 MED ORDER — LACTATED RINGERS IV SOLN
INTRAVENOUS | Status: DC | PRN
  Administered 2013-07-02 (×3): via INTRAVENOUS

## 2013-07-02 MED ORDER — NEOSTIGMINE METHYLSULFATE 10 MG/10ML IV SOLN
INTRAVENOUS | Status: DC | PRN
  Administered 2013-07-02: 3 mg via INTRAVENOUS

## 2013-07-02 MED ORDER — GLYCOPYRROLATE 0.2 MG/ML IJ SOLN
INTRAMUSCULAR | Status: DC | PRN
  Administered 2013-07-02: 0.2 mg via INTRAVENOUS
  Administered 2013-07-02: 0.4 mg via INTRAVENOUS
  Administered 2013-07-02: 0.2 mg via INTRAVENOUS

## 2013-07-02 MED ORDER — ONDANSETRON HCL 4 MG/2ML IJ SOLN
INTRAMUSCULAR | Status: DC | PRN
  Administered 2013-07-02 (×2): 4 mg via INTRAVENOUS

## 2013-07-02 MED ORDER — EPHEDRINE SULFATE 50 MG/ML IJ SOLN
INTRAMUSCULAR | Status: DC | PRN
  Administered 2013-07-02: 10 mg via INTRAVENOUS

## 2013-07-02 MED ORDER — GLYCOPYRROLATE 0.2 MG/ML IJ SOLN
INTRAMUSCULAR | Status: AC
  Filled 2013-07-02: qty 2

## 2013-07-02 MED ORDER — SUCRALFATE 1 GM/10ML PO SUSP
1.0000 g | Freq: Two times a day (BID) | ORAL | Status: DC
  Administered 2013-07-02 – 2013-07-03 (×2): 1 g via ORAL
  Filled 2013-07-02 (×3): qty 10

## 2013-07-02 MED ORDER — PANTOPRAZOLE SODIUM 40 MG PO TBEC
40.0000 mg | DELAYED_RELEASE_TABLET | Freq: Every day | ORAL | Status: DC
  Administered 2013-07-02 – 2013-07-03 (×2): 40 mg via ORAL
  Filled 2013-07-02 (×2): qty 1

## 2013-07-02 MED ORDER — 0.9 % SODIUM CHLORIDE (POUR BTL) OPTIME
TOPICAL | Status: DC | PRN
  Administered 2013-07-02: 1000 mL

## 2013-07-02 MED ORDER — DEXTROSE 5 % IV SOLN
1.5000 g | INTRAVENOUS | Status: DC
  Filled 2013-07-02: qty 1.5

## 2013-07-02 MED ORDER — SUCCINYLCHOLINE CHLORIDE 20 MG/ML IJ SOLN
INTRAMUSCULAR | Status: AC
  Filled 2013-07-02: qty 1

## 2013-07-02 MED ORDER — ALUM & MAG HYDROXIDE-SIMETH 200-200-20 MG/5ML PO SUSP: 15.0000 mL | ORAL | Status: DC | PRN

## 2013-07-02 MED ORDER — METOPROLOL TARTRATE 1 MG/ML IV SOLN: 2.0000 mg | INTRAVENOUS | Status: DC | PRN

## 2013-07-02 MED ORDER — BISACODYL 10 MG RE SUPP: 10.0000 mg | Freq: Every day | RECTAL | Status: DC | PRN

## 2013-07-02 MED ORDER — HEPARIN SODIUM (PORCINE) 1000 UNIT/ML IJ SOLN
INTRAMUSCULAR | Status: DC | PRN
  Administered 2013-07-02: 7500 [IU] via INTRAVENOUS
  Administered 2013-07-02: 1000 [IU] via INTRAVENOUS
  Administered 2013-07-02 (×2): 2000 [IU] via INTRAVENOUS

## 2013-07-02 MED ORDER — METOPROLOL TARTRATE 12.5 MG HALF TABLET
12.5000 mg | ORAL_TABLET | Freq: Two times a day (BID) | ORAL | Status: DC
  Administered 2013-07-03: 12.5 mg via ORAL
  Filled 2013-07-02 (×3): qty 1

## 2013-07-02 MED ORDER — CHLORHEXIDINE GLUCONATE 4 % EX LIQD
1.0000 "application " | Freq: Once | CUTANEOUS | Status: DC
  Filled 2013-07-02: qty 15

## 2013-07-02 MED ORDER — HYDROMORPHONE HCL PF 1 MG/ML IJ SOLN
0.5000 mg | INTRAMUSCULAR | Status: DC | PRN
  Administered 2013-07-02 – 2013-07-03 (×3): 1 mg via INTRAVENOUS
  Filled 2013-07-02 (×3): qty 1

## 2013-07-02 MED ORDER — PHENOL 1.4 % MT LIQD: 1.0000 | OROMUCOSAL | Status: DC | PRN

## 2013-07-02 MED ORDER — IODIXANOL 320 MG/ML IV SOLN
INTRAVENOUS | Status: DC | PRN
  Administered 2013-07-02: 50 mL via INTRAVENOUS

## 2013-07-02 MED ORDER — POTASSIUM CHLORIDE CRYS ER 20 MEQ PO TBCR: 20.0000 meq | EXTENDED_RELEASE_TABLET | Freq: Every day | ORAL | Status: DC | PRN

## 2013-07-02 MED ORDER — ONDANSETRON HCL 4 MG/2ML IJ SOLN: 4.0000 mg | Freq: Once | INTRAMUSCULAR | Status: DC | PRN

## 2013-07-02 MED ORDER — HEPARIN SODIUM (PORCINE) 5000 UNIT/ML IJ SOLN
INTRAMUSCULAR | Status: DC | PRN
  Administered 2013-07-02 (×3)

## 2013-07-02 MED ORDER — ATROPINE SULFATE 0.4 MG/ML IJ SOLN
INTRAMUSCULAR | Status: DC | PRN
  Administered 2013-07-02: 0.2 mg via INTRAVENOUS
  Administered 2013-07-02: .2 mg via INTRAVENOUS

## 2013-07-02 MED ORDER — ACETAMINOPHEN 650 MG RE SUPP: 325.0000 mg | RECTAL | Status: DC | PRN

## 2013-07-02 MED ORDER — LABETALOL HCL 5 MG/ML IV SOLN: 10.0000 mg | INTRAVENOUS | Status: DC | PRN

## 2013-07-02 SURGICAL SUPPLY — 93 items
ADH SKN CLS APL DERMABOND .7 (GAUZE/BANDAGES/DRESSINGS) ×2
BAG BANDED W/RUBBER/TAPE 36X54 (MISCELLANEOUS) ×3 IMPLANT
BAG EQP BAND 135X91 W/RBR TAPE (MISCELLANEOUS) ×1
BAG SNAP BAND KOVER 36X36 (MISCELLANEOUS) ×9 IMPLANT
BLADE 10 SAFETY STRL DISP (BLADE) ×3 IMPLANT
BLADE SURG CLIPPER 3M 9600 (MISCELLANEOUS) ×3 IMPLANT
BLADE SURG ROTATE 9660 (MISCELLANEOUS) ×3 IMPLANT
CANISTER SUCTION 2500CC (MISCELLANEOUS) ×3 IMPLANT
CATH BEACON 5 .035 65 VANSC4 (CATHETERS) ×3 IMPLANT
CATH BEACON 5.038 65CM KMP-01 (CATHETERS) ×3 IMPLANT
CATH CROSS OVER TEMPO 5F (CATHETERS) ×3 IMPLANT
CATH OMNI FLUSH .035X70CM (CATHETERS) ×2 IMPLANT
CATH QUICKCROSS SUPP .035X90CM (MICROCATHETER) ×3 IMPLANT
CATH SOFT-VU 4F 65 STRAIGHT (CATHETERS) IMPLANT
CATH SOFT-VU STRAIGHT 4F 65CM (CATHETERS) ×3
CATH SOFTOUCH MOTARJEME 5F (CATHETERS) ×2 IMPLANT
CATH VANSCH 5FR 6CM (CATHETERS) ×2 IMPLANT
CLIP TI MEDIUM 24 (CLIP) IMPLANT
CLIP TI WIDE RED SMALL 24 (CLIP) IMPLANT
COVER DOME SNAP 22 D (MISCELLANEOUS) ×3 IMPLANT
COVER MAYO STAND STRL (DRAPES) ×3 IMPLANT
COVER PROBE W GEL 5X96 (DRAPES) ×3 IMPLANT
COVER SURGICAL LIGHT HANDLE (MISCELLANEOUS) ×3 IMPLANT
DERMABOND ADVANCED (GAUZE/BANDAGES/DRESSINGS) ×4
DERMABOND ADVANCED .7 DNX12 (GAUZE/BANDAGES/DRESSINGS) ×2 IMPLANT
DEVICE CLOSURE PERCLS PRGLD 6F (VASCULAR PRODUCTS) IMPLANT
DEVICE ENSNARE  12MMX20MM (VASCULAR PRODUCTS) ×2
DEVICE ENSNARE 12MMX20MM (VASCULAR PRODUCTS) IMPLANT
DEVICE TORQUE H2O (MISCELLANEOUS) ×4 IMPLANT
DRAPE TABLE COVER HEAVY DUTY (DRAPES) ×3 IMPLANT
DRSG TEGADERM 2-3/8X2-3/4 SM (GAUZE/BANDAGES/DRESSINGS) ×6 IMPLANT
DRYSEAL FLEXSHEATH 12FR 33CM (SHEATH) ×2
DRYSEAL FLEXSHEATH 18FR 33CM (SHEATH) ×2
ELECT REM PT RETURN 9FT ADLT (ELECTROSURGICAL) ×6
ELECTRODE REM PT RTRN 9FT ADLT (ELECTROSURGICAL) ×2 IMPLANT
EXCLUDER TNK LEG 28MX12X18 (Endovascular Graft) IMPLANT
EXCLUDER TRUNK LEG 28MX12X18 (Endovascular Graft) ×3 IMPLANT
GAUZE SPONGE 2X2 8PLY STRL LF (GAUZE/BANDAGES/DRESSINGS) ×2 IMPLANT
GAUZE SPONGE 4X4 16PLY XRAY LF (GAUZE/BANDAGES/DRESSINGS) ×3 IMPLANT
GLOVE BIOGEL PI IND STRL 7.5 (GLOVE) ×1 IMPLANT
GLOVE BIOGEL PI INDICATOR 7.5 (GLOVE) ×4
GLOVE SS BIOGEL STRL SZ 7 (GLOVE) IMPLANT
GLOVE SUPERSENSE BIOGEL SZ 7 (GLOVE) ×2
GLOVE SURG SS PI 7.5 STRL IVOR (GLOVE) ×3 IMPLANT
GOWN STRL REUS W/ TWL LRG LVL3 (GOWN DISPOSABLE) ×2 IMPLANT
GOWN STRL REUS W/ TWL XL LVL3 (GOWN DISPOSABLE) ×1 IMPLANT
GOWN STRL REUS W/TWL LRG LVL3 (GOWN DISPOSABLE) ×6
GOWN STRL REUS W/TWL XL LVL3 (GOWN DISPOSABLE) ×6
GRAFT BALLN CATH 65CM (STENTS) IMPLANT
GRAFT EXCLUDER LEG 12X14 (Endovascular Graft) ×2 IMPLANT
GUIDEWIRE ANGLED .035X150CM (WIRE) ×3 IMPLANT
GUIDEWIRE ANGLED .035X260CM (WIRE) ×2 IMPLANT
HEMOSTAT SNOW SURGICEL 2X4 (HEMOSTASIS) IMPLANT
KIT BASIN OR (CUSTOM PROCEDURE TRAY) ×3 IMPLANT
KIT ROOM TURNOVER OR (KITS) ×3 IMPLANT
NEEDLE PERC 18GX7CM (NEEDLE) ×3 IMPLANT
NS IRRIG 1000ML POUR BTL (IV SOLUTION) ×3 IMPLANT
PACK AORTA (CUSTOM PROCEDURE TRAY) ×3 IMPLANT
PAD ARMBOARD 7.5X6 YLW CONV (MISCELLANEOUS) ×6 IMPLANT
PENCIL BUTTON HOLSTER BLD 10FT (ELECTRODE) IMPLANT
PERCLOSE PROGLIDE 6F (VASCULAR PRODUCTS) ×12
PROTECTION STATION PRESSURIZED (MISCELLANEOUS) ×3
SHEATH AVANTI 11CM 8FR (MISCELLANEOUS) ×2 IMPLANT
SHEATH BRITE TIP 8FR 23CM (MISCELLANEOUS) ×2 IMPLANT
SHEATH DRYSEAL FLEX 12FR 33CM (SHEATH) IMPLANT
SHEATH DRYSEAL FLEX 18FR 33CM (SHEATH) IMPLANT
SHEATH FLEX ANSEL ANG 6F 45CM (SHEATH) ×2 IMPLANT
SHIELD RADPAD SCOOP 12X17 (MISCELLANEOUS) ×6 IMPLANT
SNARE GOOSENECK 10MM (VASCULAR PRODUCTS) ×2 IMPLANT
SNARE GOOSENECK 15MM (MISCELLANEOUS) ×2 IMPLANT
SPONGE GAUZE 2X2 STER 10/PKG (GAUZE/BANDAGES/DRESSINGS) ×4
STATION PROTECTION PRESSURIZED (MISCELLANEOUS) IMPLANT
STENT GRAFT BALLN CATH 65CM (STENTS) ×2
STOPCOCK MORSE 400PSI 3WAY (MISCELLANEOUS) ×3 IMPLANT
SUT ETHILON 3 0 PS 1 (SUTURE) IMPLANT
SUT PROLENE 5 0 C 1 24 (SUTURE) IMPLANT
SUT VIC AB 2-0 CT1 27 (SUTURE)
SUT VIC AB 2-0 CT1 TAPERPNT 27 (SUTURE) IMPLANT
SUT VIC AB 3-0 SH 27 (SUTURE)
SUT VIC AB 3-0 SH 27X BRD (SUTURE) IMPLANT
SUT VICRYL 4-0 PS2 18IN ABS (SUTURE) ×6 IMPLANT
SYR 20CC LL (SYRINGE) ×8 IMPLANT
SYR 30ML LL (SYRINGE) IMPLANT
SYR 5ML LL (SYRINGE) IMPLANT
SYR MEDRAD MARK V 150ML (SYRINGE) ×3 IMPLANT
SYRINGE 10CC LL (SYRINGE) ×9 IMPLANT
TOWEL OR 17X24 6PK STRL BLUE (TOWEL DISPOSABLE) ×8 IMPLANT
TOWEL OR 17X26 10 PK STRL BLUE (TOWEL DISPOSABLE) ×6 IMPLANT
TRAY FOLEY CATH 16FRSI W/METER (SET/KITS/TRAYS/PACK) ×3 IMPLANT
TUBING HIGH PRESSURE 120CM (CONNECTOR) ×3 IMPLANT
VISIPAQUE 320MG/ML ( 150ML BOTTLE) ×2 IMPLANT
WIRE AMPLATZ SS-J .035X180CM (WIRE) ×6 IMPLANT
WIRE BENTSON .035X145CM (WIRE) ×10 IMPLANT

## 2013-07-02 NOTE — Op Note (Signed)
Patient name: KALEL HARTY MRN: [id number] DOB: 1945/08/18 Sex: male  07/02/2013 Pre-operative Diagnosis: Abdominal aortic aneurysm Post-operative diagnosis:  Same Surgeon:  Eldridge Abrahams Assistants:  Gerri Lins Procedure:   #1: Endovascular repair of abdominal aortic aneurysm   #2: Bilateral ultrasound guided common femoral artery access   #3: Abdominal aortogram   #4: Catheter and aorta x2 Anesthesia:  Gen. Blood Loss:  See anesthesia record Specimens:  None  Findings:  Complete exclusion.  Extreme difficulty was encountered cannulating the gate.  This required snaring the wire from the ipsilateral side. Devices used: Main body was primary right Gore 28 x 12 x 18.  Contralateral left 12 x 14.  Indications:  The patient presented to the emergency department with abdominal pain.  During his workup he was found to have a 5.8 cm aneurysm.  He does experience GI symptoms which required cholecystectomy.  He has improved and is here today for repair.  Procedure:  The patient was identified in the holding area and taken to [place] [address]  The patient was then placed supine on the table. general anesthesia was administered.  The patient was prepped and draped in the usual sterile fashion.  A time out was called and antibiotics were administered.  Ultrasound was used to evaluate bilateral common femoral arteries which are widely patent without significant anterior calcification.  Digital ultrasound images were acquired.  An 11 blade was used to make a skin nick bilaterally.  Bilateral common femoral arteries were cannulated under ultrasound guidance.  An 035 wires were advanced without resistance.  The subcutaneous tract was dilated with an 8 [place] dilator.  Pro-glide devices were deployed at the 11:00 and 1:00 position for pre-closure.  The patient was fully heparinized.  A Amplatz superstiff wire was placed up the right side and a 18 French sheath was advanced without resistance into  the abdominal aorta.  The main body device was prepared on the back table.  This was a Gore 28 x 12 x 18 device.  Next a Omni flush catheter was advanced up the left leg and positioned at the level of L1.  A contrast injection was performed which located the renal arteries.  The main body was then deployed down to the contralateral gate landing at the level of the right renal artery, which was the lowest renal artery. Extreme difficulty cannulating the gate was encountered.  Numerous catheters and wires were tried without success.  I elected to snare the wire from the contralateral side.  To do this I had to deployed the remaining portion of the ipsilateral limb.  I shot a retrograde injection through the sheath in the right groin which located the right hypogastric artery and then the main body was then deployed down to this level.  A  Q -%0alloon was used to mold the top portion and the limb of the graft.  I then placed a reverse curve catheter around the flow divider.  I had trouble getting the wire out the bottom of the gate.  When I finally took an ejection, the gate had opened up into thrombus which created a very small path.  I selected a En-snare and then eventually was able to snare a Benson wire and pull it out through the sheath on the left groin.  I had previously changed out for a 12 [place] sheath.  Once this was done, the dilator was advanced into the 12 French sheath and I advanced the sheath with  the dilator into the contralateral gate.  I switched out for a Amplatz superstiff wire.  I then perform a retrograde injection into the left groin and located the left hypogastric artery.  Contralateral limb was selected.  This was a 12 x 14 device.  It was deployed landing at the level of the left hypogastric artery.  I then used the balloon to mold the remaining portion of the device.  There is a stenosis that did resolve and the left common iliac artery.  Completion angiogram was then performed which  showed successful exclusion of the aneurysm with preserved patency of the bilateral renal and hypogastric arteries.  There was no evidence of a type I or a type III endoleak.  At this point, the catheters and wires were removed.  The pro-glide devices were deployed for successful closure of the groins.  50 mg of protamine were administered.  The skin was closed with 4-0 Vicryl and Dermabond.  There were no immediate complications.     Disposition:  To PACU in stable condition.   Theotis Burrow, M.D. [facility] Office: [phone] Pager:  [phone]

## 2013-07-02 NOTE — Anesthesia Preprocedure Evaluation (Signed)
Anesthesia Evaluation  Patient identified by MRN, date of birth, ID band Patient awake    Reviewed: Allergy & Precautions, H&P , NPO status , Patient's Chart, lab work & pertinent test results  History of Anesthesia Complications (+) PONV  Airway       Dental   Pulmonary former smoker,          Cardiovascular hypertension, + CAD, + Past MI and + Peripheral Vascular Disease + dysrhythmias Atrial Fibrillation     Neuro/Psych    GI/Hepatic   Endo/Other    Renal/GU      Musculoskeletal   Abdominal   Peds  Hematology  (+) anemia ,   Anesthesia Other Findings   Reproductive/Obstetrics                           Anesthesia Physical Anesthesia Plan  ASA: III  Anesthesia Plan: General   Post-op Pain Management:    Induction: Intravenous  Airway Management Planned: Oral ETT  Additional Equipment: Arterial line and CVP  Intra-op Plan:   Post-operative Plan: Extubation in OR  Informed Consent: I have reviewed the patients History and Physical, chart, labs and discussed the procedure including the risks, benefits and alternatives for the proposed anesthesia with the patient or authorized representative who has indicated his/her understanding and acceptance.     Plan Discussed with:   Anesthesia Plan Comments:         Anesthesia Quick Evaluation

## 2013-07-02 NOTE — Progress Notes (Signed)
Critiacal lab value Hb- 6.5 reported to Dr.Brabham, ordered to repeat lab again and transfuse one RBC if Hb<7. Repeated Hb- 6.1. Gave report to 3S, blood will be transfusing in the stepdown unit.  [name]

## 2013-07-02 NOTE — Progress Notes (Signed)
Patient already urinated therefore urinalysis will need to be collected at a later time.

## 2013-07-02 NOTE — Transfer of Care (Signed)
Immediate Anesthesia Transfer of Care Note  Patient: [name]  Procedure(s) Performed: Procedure(s): ABDOMINAL AORTIC ENDOVASCULAR STENT GRAFT (N/A)  Patient Location: PACU  Anesthesia Type:General  Level of Consciousness: awake, alert , oriented and patient cooperative  Airway & Oxygen Therapy: Patient Spontanous Breathing and Patient connected to nasal cannula oxygen  Post-op Assessment: Report given to PACU RN, Post -op Vital signs reviewed and stable and Patient moving all extremities  Post vital signs: Reviewed and stable  Complications: No apparent anesthesia complications

## 2013-07-02 NOTE — OR Nursing (Signed)
Foreskin was pulled back over penis head after foley insertion.

## 2013-07-02 NOTE — Progress Notes (Signed)
Consent for blood transfusion received from his wife via phone.

## 2013-07-02 NOTE — Progress Notes (Signed)
Pharmacy-Antibiotic renal dose adjustment.  6 YOM s/p endovascular repair of his AAA, on vancomycin for surgical prophylaxis, pharmacy to adjust dosage base one renal function. Scr 1.11 on 6/19, est. crcl ~ 65-70 ml/min. Received cefuroxime pre-op  Plan: Vancomycin 1g Q 12 hrs x 2 doses Pharmacy sign off.  Thanks.   Maryanna Shape, PharmD, BCPS  Clinical Pharmacist  Pager: [phone]

## 2013-07-02 NOTE — H&P (Signed)
Consult Note  Patient name: [name] MRN: [id number] DOB: 07/02/1945 Sex: male  Consulting Physician: ER  Reason for Consult:  Chief Complaint   Patient presents with   .  Abdominal Pain   HISTORY OF PRESENT ILLNESS:  This is a 68 yo male who presented to the ER with abdominal pain and nausea earlier this am. His symptoms were present for approximately 36 hours. His sx'x began following eating [place] food. He developed A rash across his face secondary to violent emesis. He was diagnosed with an allergic reaction and sent home. He represented via EMS later today with dry heaving and diffuse abdominal pain. A non-contrast CT scan was obtained as he had a slight increase in his creatinine. This revealed mild diverticulitis at the descending colon / sigmoid junction and a 5.8cm AAA. With medication, his abdominal pain and nausea resolved.  The patient has a history of CAD and is followed by Dr. Burt Knack. He has a history of an inferior wall MI in 2010, treated with PCI to the RCA. He ahd moderate distal left main and moderate LCX disease which were treated medically. A follow up myoview was negative for significant ischemia. He is scheduled for myoview in July. He denies active chest pain. He also describes difficulty with walking at approximately 10 minutes. He gets pain in his thigh and feels as if his legs are going to give out. His symptoms are alleviated with rest. He has attributed this to lower back pain and sciatica.  The patient is a current smoker. He is medically managed for hypertension with an ACE inhibitor. He is medically treated for hypercholesterolemia with a statin. He takes ASA $RemoveB'81mg'KuyXzkWk$  as antiplatelet therapy. He has mild renal insufficiency. His creatinine in the ER was 1.38 with a GFR of 52. This could be secondary to dehydration given his recent n/v. He has been diagnosed with COPD. He is not on O2.  Past Medical History   Diagnosis  Date   .  Coronary atherosclerosis of native  coronary artery      inferior wall MI 2010   .  Essential hypertension, benign    .  Pure hypercholesterolemia     Past Surgical History   Procedure  Laterality  Date   .  Cardiac stents   2010    History    Social History   .  Marital Status:  Married     Spouse Name:  N/A     Number of Children:  N/A   .  Years of Education:  N/A    Occupational History   .  Not on file.    Social History Main Topics   .  Smoking status:  Former Smoker     Types:  Cigarettes     Quit date:  07/25/2010   .  Smokeless tobacco:  Never Used   .  Alcohol Use:  No   .  Drug Use:  Not on file   .  Sexual Activity:  Not on file    Other Topics  Concern   .  Not on file    Social History Narrative   .  No narrative on file   History reviewed. No pertinent family history.  Allergies as of 06/05/2013   .  (No Known Allergies)    No current facility-administered medications on file prior to encounter.    Current Outpatient Prescriptions on File Prior to Encounter   Medication  Sig  Dispense  Refill   .  aspirin 81 MG tablet  Take 81 mg by mouth daily.     .  fish oil-omega-3 fatty acids 1000 MG capsule  Take 2 g by mouth daily.     .  metoprolol tartrate (LOPRESSOR) 25 MG tablet  Take 0.5 tablets (12.5 mg total) by mouth 2 (two) times daily.  90 tablet  3   .  nitroGLYCERIN (NITROSTAT) 0.4 MG SL tablet  Place 1 tablet (0.4 mg total) under the tongue every 5 (five) minutes as needed.  25 tablet  1   .  ondansetron (ZOFRAN ODT) 4 MG disintegrating tablet  Take 1 tablet (4 mg total) by mouth every 8 (eight) hours as needed for nausea.  10 tablet  0   .  oxymetazoline (AFRIN) 0.05 % nasal spray  Place 2 sprays into both nostrils 2 (two) times daily as needed for congestion.     .  predniSONE (DELTASONE) 20 MG tablet  Take 2 tablets (40 mg total) by mouth daily.  10 tablet  0   .  promethazine (PHENERGAN) 25 MG tablet  Take 25 mg by mouth every 6 (six) hours as needed for nausea or vomiting.     .   rosuvastatin (CRESTOR) 10 MG tablet  Take 10 mg by mouth daily.     REVIEW OF SYSTEMS:  Cardiovascular: No chest pain, chest pressure, palpitations, orthopnea, or dyspnea on exertion.No history of DVT or phlebitis. See HPI for claudication symptoms  Pulmonary: No productive cough, asthma or wheezing.  Neurologic: No weakness, paresthesias, aphasia, or amaurosis. No dizziness.  Hematologic: No bleeding problems or clotting disorders.  Musculoskeletal: No joint pain or joint swelling.  Gastrointestinal: abdominal pain, n/v  Genitourinary: No dysuria or hematuria.  Psychiatric:: No history of major depression.  Integumentary: No rashes or ulcers.  Constitutional: No fever or chills.  PHYSICAL EXAMINATION:  General: The patient appears their stated age. Vital signs are BP 117/73  Pulse 116  Temp(Src) 98.5 F (36.9 C) (Oral)  Resp 13  Ht $R'5\' 10"'VX$  (1.778 m)  Wt 199 lb (90.266 kg)  BMI 28.55 kg/m2  SpO2 96%  Pulmonary: Respirations are non-labored  HEENT: No gross abnormalities  Abdomen: no gross tenderness, however with deep palpation, I can feel his AAA, and he does endorse pain with this maneuver  Musculoskeletal: There are no major deformities.  Neurologic: No focal weakness or paresthesias are detected,  Skin: There are no ulcer or rashes noted.  Psychiatric: The patient has normal affect.  Cardiovascular: There is a regular rate and rhythm without significant murmur appreciated. Palpable femoral pulses, R>L. Pedal pulses are not palpable  Diagnostic Studies:  I have reviewed his non-contrast CT a/p. This shows mild diverticulitis and a 5.8 cm infrarenal AAA  Assessment:  Abdominal pain.   Plan:   I discussed the details of endovascular repair of his AAA. We discussed the potential risks including but not limited to bleeding, cardio-pulmonary complications, bowel ischemia, lower extremity ischemia, and death.  I discussed the importance of smoking cessation.  Pt has recovered from  his lap chole.  His nausea is better>  I spoke with he and his wife lst night and he feels he is ready to get this done    V. Leia Alf, M.D.  [facility] [facility]  Office: [phone]  Pager: [phone]

## 2013-07-02 NOTE — Anesthesia Procedure Notes (Signed)
Procedure Name: Intubation Date/Time: 07/02/2013 7:39 AM Performed by: Julian Reil Pre-anesthesia Checklist: Patient identified, Emergency Drugs available, Suction available and Patient being monitored Patient Re-evaluated:Patient Re-evaluated prior to inductionOxygen Delivery Method: Circle system utilized Preoxygenation: Pre-oxygenation with 100% oxygen Intubation Type: IV induction Ventilation: Mask ventilation without difficulty Laryngoscope Size: Mac and 4 Grade View: Grade I Tube size: 7.5 mm Number of attempts: 1 Airway Equipment and Method: Stylet and LTA kit utilized Placement Confirmation: ETT inserted through vocal cords under direct vision,  positive ETCO2 and breath sounds checked- equal and bilateral Secured at: 22 cm Tube secured with: Tape Dental Injury: Teeth and Oropharynx as per pre-operative assessment

## 2013-07-02 NOTE — Anesthesia Postprocedure Evaluation (Signed)
  Anesthesia Post-op Note  Patient: [name]  Procedure(s) Performed: Procedure(s): ABDOMINAL AORTIC ENDOVASCULAR STENT GRAFT (N/A)  Patient Location: PACU  Anesthesia Type:General  Level of Consciousness: awake, oriented, sedated and patient cooperative  Airway and Oxygen Therapy: Patient Spontanous Breathing  Post-op Pain: mild  Post-op Assessment: Post-op Vital signs reviewed, Patient's Cardiovascular Status Stable, Respiratory Function Stable, Patent Airway, No signs of Nausea or vomiting and Pain level controlled  Post-op Vital Signs: stable  Last Vitals:  Filed Vitals:   07/02/13 1330  BP: 98/51  Pulse: 63  Temp:   Resp: 13    Complications: No apparent anesthesia complications

## 2013-07-02 NOTE — Progress Notes (Signed)
Utilization review completed.  

## 2013-07-03 LAB — BASIC METABOLIC PANEL
BUN: 10 mg/dL (ref 6–23)
CHLORIDE: 99 meq/L (ref 96–112)
CO2: 21 meq/L (ref 19–32)
Calcium: 8.5 mg/dL (ref 8.4–10.5)
Creatinine, Ser: 1.15 mg/dL (ref 0.50–1.35)
GFR calc Af Amer: 75 mL/min — ABNORMAL LOW (ref >90)
GFR calc non Af Amer: 65 mL/min — ABNORMAL LOW (ref >90)
Glucose, Bld: 110 mg/dL — ABNORMAL HIGH (ref 70–99)
Potassium: 3.5 mEq/L — ABNORMAL LOW (ref 3.7–5.3)
SODIUM: 137 meq/L (ref 137–147)

## 2013-07-03 LAB — TYPE AND SCREEN
ABO/RH(D): A POS
ANTIBODY SCREEN: NEGATIVE
Unit division: 0
Unit division: 0

## 2013-07-03 LAB — CBC
HCT: 23.7 % — ABNORMAL LOW (ref 39.0–52.0)
Hemoglobin: 7.9 g/dL — ABNORMAL LOW (ref 13.0–17.0)
MCH: 28.6 pg (ref 26.0–34.0)
MCHC: 33.3 g/dL (ref 30.0–36.0)
MCV: 85.9 fL (ref 78.0–100.0)
Platelets: 86 10*3/uL — ABNORMAL LOW (ref 150–400)
RBC: 2.76 MIL/uL — AB (ref 4.22–5.81)
RDW: 18.4 % — ABNORMAL HIGH (ref 11.5–15.5)
WBC: 3.5 10*3/uL — AB (ref 4.0–10.5)

## 2013-07-03 NOTE — Progress Notes (Signed)
Intermittent, Parkinsonian-like tremors have resolved over the past couple hours. Connye Burkitt, RN

## 2013-07-03 NOTE — Progress Notes (Signed)
Discharge instructions (and handout) given to pt and wife -- both verbalized understanding of follow-up appt, wound care, home meds, activity levels, diet, s/s of complications and when to call MD/EMS. Connye Burkitt, RN

## 2013-07-03 NOTE — Progress Notes (Signed)
Subjective: Interval History: none.Marland Kitchen up in chair and comfortable  Objective: Vital signs in last 24 hours: Temp:  [98 F (36.7 C)-99.3 F (37.4 C)] 99 F (37.2 C) (06/20 0750) Pulse Rate:  [61-89] 78 (06/20 0750) Resp:  [10-20] 13 (06/20 0750) BP: (91-115)/(46-71) 99/57 mmHg (06/20 0750) SpO2:  [96 %-100 %] 98 % (06/20 0750) Arterial Line BP: (102-148)/(44-60) 133/56 mmHg (06/20 0300)  Intake/Output from previous day: 06/19 0701 - 06/20 0700 In: 3045.3 [P.O.:120; I.V.:2462.5; Blood:262.8; IV Piggyback:200] Out: 1350 [Urine:1150; Blood:200] Intake/Output this shift:    Abdomen soft and nontender, groin puncture site without hematoma. Palpable popliteal pulses bilaterally  Lab Results:  Recent Labs  07/02/13 1320 07/03/13 0445  WBC 2.3* 3.5*  HGB 6.1* 7.9*  HCT 18.6* 23.7*  PLT 90* 86*   BMET  Recent Labs  07/02/13 1230 07/02/13 1535 07/03/13 0445  NA 137  --  137  K 3.9  --  3.5*  CL 99  --  99  CO2 23  --  21  GLUCOSE 133*  --  110*  BUN 14  --  10  CREATININE 1.11 1.10 1.15  CALCIUM 8.8  --  8.5    Studies/Results: Ct Abdomen Pelvis Wo Contrast  06/05/2013   CLINICAL DATA:  Abdominal pain and cramping.  Hematuria.  EXAM: CT ABDOMEN AND PELVIS WITHOUT CONTRAST  TECHNIQUE: Multidetector CT imaging of the abdomen and pelvis was performed following the standard protocol without IV contrast.  COMPARISON:  None.  FINDINGS: Minimal dependent atelectasis in the visualized lung bases. Coronary calcification. Unremarkable liver, spleen, right adrenal gland, pancreas. Unenhanced CT was performed per clinician order. Lack of IV contrast limits sensitivity and specificity, especially for evaluation of abdominal/pelvic solid viscera.  At least 2 subcentimeter partially calcified stones layer in the dependent aspect of the nondilated gallbladder. 15 mm low-attenuation left adrenal nodule, probably benign adenomas in the absence of history of primary carcinoma. . Small probable  cysts in the upper pole left kidney and from the lower pole right kidney. No hydronephrosis.  Fusiform distal aortic aneurysm measuring up to 5.8 cm maximum transverse diameter, tapering to a diameter of 3.6 cm at the bifurcation. Iliac arteries are atheromatous but non aneurysmal. There is no evidence of retroperitoneal hemorrhage nor hyperdense crescent to suggest impending rupture.  Stomach, small bowel, colon are nondilated. Multiple descending and sigmoid diverticula, with some mild inflammatory/edematous changes around the descending/sigmoid junction. No abscess. No free air. No ascites. Urinary bladder physiologically distended. Moderate prostatic enlargement with central coarse calcifications. No adenopathy.  IMPRESSION: 1. Diverticulitis at the descending/sigmoid junction without abscess. 2. 5.8 cm abdominal aortic aneurysm. Vascular surgery consultation recommended due to increased risk of rupture for AAA >5.5 cm. This recommendation follows ACR consensus. This recommendation follows ACR consensus guidelines: White Paper of the ACR Incidental Findings Committee II on Vascular Findings. J Am Coll Radiol 2013; 10:789-794.   Electronically Signed   By: Arne Cleveland M.D.   On: 06/05/2013 12:52   Dg Chest 2 View  06/14/2013   CLINICAL DATA:  Short of breath  EXAM: CHEST  2 VIEW  COMPARISON:  06/13/2013  FINDINGS: Hyperaeration. Mild cardiomegaly. Linear atelectasis or scar the right days. Interstitial prominence is stable. No pleural effusion. No pneumothorax.  IMPRESSION: Cardiomegaly without edema.   Electronically Signed   By: Maryclare Bean M.D.   On: 06/14/2013 07:55   Dg Chest 2 View  06/13/2013   ADDENDUM REPORT: 06/13/2013 17:45  ADDENDUM: Voice recognition errror:  The last  line of the findings should read " NO pneumothorax."  The first line of the impression should read "VERY FINE airspace disease"   Electronically Signed   By: Suzy Bouchard M.D.   On: 06/13/2013 17:45   06/13/2013   CLINICAL  DATA:  Abdominal pain, nausea and vomiting  EXAM: CHEST  2 VIEW  COMPARISON:  Radiograph 523 1,015  FINDINGS: Normal cardiac silhouette. Aorta is ectatic. There is fine nodular airspace right lower lobe which is new prior. No focal consolidation. No pleural fluid. Pneumothorax.  IMPRESSION: Verify airspace disease in the right lower lobe suggesting pulmonary edema or infection.  Electronically Signed: By: Suzy Bouchard M.D. On: 06/13/2013 17:27   Dg Chest 2 View  06/05/2013   CLINICAL DATA:  Vomiting and facial swelling.  EXAM: CHEST  2 VIEW  COMPARISON:  Chest radiograph performed 10/20/2008  FINDINGS: The lungs are well-aerated and clear. There is no evidence of focal opacification, pleural effusion or pneumothorax.  The heart is normal in size; the mediastinal contour is within normal limits. No acute osseous abnormalities are seen.  IMPRESSION: No acute cardiopulmonary process seen.   Electronically Signed   By: Garald Balding M.D.   On: 06/05/2013 04:47   US Abdomen Complete  06/17/2013   CLINICAL DATA:  68 year old male with abdominal pain, nausea and vomiting.  EXAM: ULTRASOUND ABDOMEN COMPLETE  COMPARISON:  06/14/2013 CT  FINDINGS: Gallbladder:  Three mobile gallstones are identified, each approximately 1 cm. There is no evidence of gallbladder wall thickening, pericholecystic fluid or sonographic Murphy sign.  Common bile duct:  Diameter: 6.4 mm. There is no evidence of intrahepatic or extrahepatic biliary dilatation. The visualized CBD is unremarkable.  Liver:  No focal lesion identified. Within normal limits in parenchymal echogenicity.  IVC:  No abnormality visualized.  Pancreas:  Visualized portion unremarkable.  Spleen:  Size and appearance within normal limits.  Right Kidney:  Length: 11.5 cm. Echogenicity within normal limits. No mass or hydronephrosis visualized. Multiple cysts are identified.  Left Kidney:  Length: 11.9 cm. Echogenicity within normal limits. No mass or hydronephrosis  visualized. Multiple cysts are identified.  Abdominal aorta:  A 5.9 cm infrarenal abdominal aortic aneurysm is unchanged from recent CTs.  Other findings:  None.  IMPRESSION: Cholelithiasis without evidence of acute cholecystitis.  5.9 cm infrarenal abdominal aortic aneurysm - unchanged in size from recent 06/14/2013 CT.   Electronically Signed   By: Hassan Rowan M.D.   On: 06/17/2013 17:23   Ct Abdomen Pelvis W Contrast  06/14/2013   CLINICAL DATA:  Concern for worsening diverticulitis. Recurrence of abdominal pain with vomiting and fever  EXAM: CT ABDOMEN AND PELVIS WITH CONTRAST  TECHNIQUE: Multidetector CT imaging of the abdomen and pelvis was performed using the standard protocol following bolus administration of intravenous contrast.  CONTRAST:  153mL OMNIPAQUE IOHEXOL 300 MG/ML  SOLN  COMPARISON:  06/07/2013  FINDINGS: BODY WALL: Unremarkable.  LOWER CHEST: Mild cardiomegaly. Coronary atherosclerosis. Mild dependent atelectasis.  ABDOMEN/PELVIS:  Liver: Few tiny low densities which are nonspecific. No concerning focal abnormality.  Biliary: Cholelithiasis. The gallbladder is distended but unchanged from prior and there is no pericholecystic inflammatory change.  Pancreas: Unremarkable.  Spleen: No focal abnormality.  Adrenals: Unremarkable.  Kidneys and ureters: Small bilateral renal cysts. No evidence of solid mass. No hydronephrosis or urolithiasis.  Bladder: Unremarkable.  Reproductive: Unremarkable.  Bowel: Resolved sigmoid diverticulitis. Diverticulosis is extensive in the sigmoid region. No bowel obstruction. Negative appendix.  Retroperitoneum: No mass or adenopathy.  Peritoneum: No free fluid or gas.  Vascular: Fusiform infrarenal abdominal aortic aneurysm has unchanged maximal diameter of 5.8 cm. There is no interval discontinuity of the atherosclerotic calcification along the outer wall. Unchanged pattern of luminal thrombus. No periaortic hematoma or new contour abnormality along the spine. Vascular  surgery consultation has already been recommended.  OSSEOUS: Focally advanced degenerative disc disease at L5-S1.  IMPRESSION: 1. Resolved sigmoid diverticulitis. 2. Unchanged appearance of a 5.8 cm infrarenal abdominal aortic aneurysm. 3. Cholelithiasis.   Electronically Signed   By: Jorje Guild M.D.   On: 06/14/2013 03:21   Nm Hepato W/eject Fract  06/18/2013   CLINICAL DATA:  Abdominal pain, nausea  EXAM: NUCLEAR MEDICINE HEPATOBILIARY IMAGING WITH GALLBLADDER EF  TECHNIQUE: Sequential images of the abdomen were obtained out to 60 minutes following intravenous administration of radiopharmaceutical. After slow intravenous infusion of 1.74 micrograms Cholecystokinin, gallbladder ejection fraction was determined.  RADIOPHARMACEUTICALS:  5.0 Millicurie QH-47M Choletec  COMPARISON:  Ultrasound 06/17/2013  FINDINGS: There is prompt uptake and excretion of radiotracer by the liver. Gallbladder ejection fraction is 3%. At 30 min, normal ejection fraction is greater than 30%.  The patient did experience symptoms during CCK infusion.  IMPRESSION: Markedly low gallbladder ejection fraction. The patient experienced pain with CCK administration.   Electronically Signed   By: Rolm Baptise M.D.   On: 06/18/2013 10:52   Nm Myocar Multi W/spect W/wall Motion / Ef  06/10/2013   CLINICAL DATA:  Chest pain  EXAM: MYOCARDIAL IMAGING WITH SPECT (REST AND PHARMACOLOGIC-STRESS)  GATED LEFT VENTRICULAR WALL MOTION STUDY  LEFT VENTRICULAR EJECTION FRACTION  TECHNIQUE: Standard myocardial SPECT imaging was performed after resting intravenous injection of 10 mCi Tc-72m sestamibi. Subsequently, intravenous infusion of Lexiscan was performed under the supervision of the Cardiology staff. At peak effect of the drug, 30 mCi Tc-59m sestamibi was injected intravenously and standard myocardial SPECT imaging was performed. Quantitative gated imaging was also performed to evaluate left ventricular wall motion, and estimate left ventricular  ejection fraction.  COMPARISON:  None.  FINDINGS: SPECT: Inferior wall attenuation artifact. No perfusion defects. No stress-induced ischemia.  Wall motion:  Normal motion.  Ejection fraction: 63%. End-diastolic volume 546 cc. End systolic volume 47 cc.  IMPRESSION: Applying for a inferior wall attenuation artifact, there are no perfusion defects and there is no evidence of stress-induced ischemia.   Electronically Signed   By: Maryclare Bean M.D.   On: 06/10/2013 16:35   Dg Chest Portable 1 View  07/02/2013   CLINICAL DATA:  Status post central line placement.  EXAM: PORTABLE CHEST - 1 VIEW  COMPARISON:  June 14, 2013.  FINDINGS: Stable cardiomediastinal silhouette. No pneumothorax or pleural effusion is noted. No acute pulmonary disease is noted. Interval placement of right internal jugular catheter line with distal tip in the expected position of the top of the SVC. Bony thorax is intact.  IMPRESSION: Interval placement of right internal jugular catheter line with distal tip in the expected position of the top of the SVC. No pneumothorax is noted.   Electronically Signed   By: Sabino Dick M.D.   On: 07/02/2013 12:40   Ct Angio Chest Aortic Dissect W &/or W/o  06/07/2013   CLINICAL DATA:  Aortic aneurysm  EXAM: CT ANGIOGRAPHY CHEST, ABDOMEN AND PELVIS  TECHNIQUE: Multidetector CT imaging through the chest, abdomen and pelvis was performed using the standard protocol during bolus administration of intravenous contrast. Multiplanar reconstructed images and MIPs were obtained and reviewed to evaluate the vascular  anatomy.  CONTRAST:  147mL OMNIPAQUE IOHEXOL 350 MG/ML SOLN  COMPARISON:  06/05/2013  FINDINGS: CTA CHEST FINDINGS  No precontrast images were done. Patchy coronary, aortic arch, and descending thoracic aortic calcifications. Adequate contrast opacification of the thoracic aorta with no evidence of dissection, aneurysm, or stenosis. There is classic 3-vessel brachiocephalic arch anatomy without proximal  stenosis. Good contrast opacification of pulmonary artery branches; the exam was not optimized for detection of pulmonary emboli. No pleural or pericardial effusion. Borderline enlarged AP window, prevascular, right hilar, and precarinal lymph nodes measuring up to the 11 mm short axis diameter. Subpleural blebs in both lung apices. Minimal dependent atelectasis posteriorly in both lower lobes with some patchy airspace consolidation or subsegmental atelectasis posterolaterally at the right lung base. Thoracic spine and sternum intact.  Review of the MIP images confirms the above findings.  CTA ABDOMEN AND PELVIS FINDINGS  Arterial findings:  Aorta: Mild scattered calcified plaque in the suprarenal segment. Fusiform infrarenal aneurysm, 5.8 x 5.6 cm maximum transverse dimensions, tapering to a diameter of 3.4 cm at the bifurcation. Short infrarenal neck less than 3 cm. There is a large amount of intraluminal mural thrombus in the aneurysmal segment. No dissection or stenosis.  Celiac axis:         Patent.  Superior mesenteric: Patent, with replaced right hepatic arterial supply, an anatomic variant.  Left renal:          Single, patent.  Right renal:         Single, patent.  Inferior mesenteric: Short segment origin occlusion, reconstituted distally by visceral collaterals.  Left iliac: Eccentric calcified plaque through the common iliac artery. There is a short segment of at least 50% diameter stenosis at the origin of the common iliac artery. There is a web-like stenosis at the origin of the external iliac artery resulting greater than 50% diameter narrowing, with some mild plaque in the more distal external iliac artery but no tandem stenotic lesion. Eccentric plaque in the internal iliac artery which is patent. No aneurysm.  Right iliac: Calcified plaque throughout the common iliac artery without high-grade stenosis or aneurysm. There is mild eccentric plaque in the proximal internal iliac artery. External iliac  widely patent. No aneurysm or dissection.  Venous findings: Dedicated venous phase imaging not obtained. Patent portal vein, splenic vein, bilateral renal veins.  Review of the MIP images confirms the above findings.  Nonvascular findings: Unremarkable arterial phase evaluation of liver. Spleen mildly enlarged, 16.2 cm maximum length. 19 mm left adrenal nodule. Pancreas and right adrenal gland unremarkable. Several subcentimeter partially calcified stones layer in the dependent aspect of the gallbladder without wall thickening or enhancement. Small bilateral renal cysts. No hydronephrosis. Stomach, small bowel, and colon are nondilated. Innumerable distal descending and sigmoid diverticula. Persistent mild inflammatory change around the proximal sigmoid colon. No abscess. No free air. Urinary bladder is physiologically distended. Moderate prostatic enlargement with central coarse calcifications. No ascites. No adenopathy. Advanced degenerative disc disease L5-S1 with posterior protrusion.  IMPRESSION: 1. 5.8 cm fusiform infrarenal abdominal aortic aneurysm without evidence of rupture or other complicating features. Vascular surgery consultation recommended due to increased risk of rupture for AAA >5.5 cm. This recommendation follows ACR consensus. This recommendation follows ACR consensus guidelines: White Paper of the ACR Incidental Findings Committee II on Vascular Findings. J Am Coll Radiol 2013; 10:789-794. 2. Tandem stenoses in the proximal and distal left common iliac artery. 3. Nonspecific right hilar and mediastinal adenopathy. 4. Patchy airspace infiltrate in the posterolateral  right lower lobe. 5. Splenomegaly 6. Cholelithiasis 7. Stable left adrenal nodule, probably benign adenoma in the absence of a history of primary carcinoma. 8. Stable mild sigmoid diverticulitis without abscess.   Electronically Signed   By: Arne Cleveland M.D.   On: 06/07/2013 11:27   Ct Angio Abd/pel W/ And/or W/o  06/07/2013    CLINICAL DATA:  Aortic aneurysm  EXAM: CT ANGIOGRAPHY CHEST, ABDOMEN AND PELVIS  TECHNIQUE: Multidetector CT imaging through the chest, abdomen and pelvis was performed using the standard protocol during bolus administration of intravenous contrast. Multiplanar reconstructed images and MIPs were obtained and reviewed to evaluate the vascular anatomy.  CONTRAST:  137mL OMNIPAQUE IOHEXOL 350 MG/ML SOLN  COMPARISON:  06/05/2013  FINDINGS: CTA CHEST FINDINGS  No precontrast images were done. Patchy coronary, aortic arch, and descending thoracic aortic calcifications. Adequate contrast opacification of the thoracic aorta with no evidence of dissection, aneurysm, or stenosis. There is classic 3-vessel brachiocephalic arch anatomy without proximal stenosis. Good contrast opacification of pulmonary artery branches; the exam was not optimized for detection of pulmonary emboli. No pleural or pericardial effusion. Borderline enlarged AP window, prevascular, right hilar, and precarinal lymph nodes measuring up to the 11 mm short axis diameter. Subpleural blebs in both lung apices. Minimal dependent atelectasis posteriorly in both lower lobes with some patchy airspace consolidation or subsegmental atelectasis posterolaterally at the right lung base. Thoracic spine and sternum intact.  Review of the MIP images confirms the above findings.  CTA ABDOMEN AND PELVIS FINDINGS  Arterial findings:  Aorta: Mild scattered calcified plaque in the suprarenal segment. Fusiform infrarenal aneurysm, 5.8 x 5.6 cm maximum transverse dimensions, tapering to a diameter of 3.4 cm at the bifurcation. Short infrarenal neck less than 3 cm. There is a large amount of intraluminal mural thrombus in the aneurysmal segment. No dissection or stenosis.  Celiac axis:         Patent.  Superior mesenteric: Patent, with replaced right hepatic arterial supply, an anatomic variant.  Left renal:          Single, patent.  Right renal:         Single, patent.   Inferior mesenteric: Short segment origin occlusion, reconstituted distally by visceral collaterals.  Left iliac: Eccentric calcified plaque through the common iliac artery. There is a short segment of at least 50% diameter stenosis at the origin of the common iliac artery. There is a web-like stenosis at the origin of the external iliac artery resulting greater than 50% diameter narrowing, with some mild plaque in the more distal external iliac artery but no tandem stenotic lesion. Eccentric plaque in the internal iliac artery which is patent. No aneurysm.  Right iliac: Calcified plaque throughout the common iliac artery without high-grade stenosis or aneurysm. There is mild eccentric plaque in the proximal internal iliac artery. External iliac widely patent. No aneurysm or dissection.  Venous findings: Dedicated venous phase imaging not obtained. Patent portal vein, splenic vein, bilateral renal veins.  Review of the MIP images confirms the above findings.  Nonvascular findings: Unremarkable arterial phase evaluation of liver. Spleen mildly enlarged, 16.2 cm maximum length. 19 mm left adrenal nodule. Pancreas and right adrenal gland unremarkable. Several subcentimeter partially calcified stones layer in the dependent aspect of the gallbladder without wall thickening or enhancement. Small bilateral renal cysts. No hydronephrosis. Stomach, small bowel, and colon are nondilated. Innumerable distal descending and sigmoid diverticula. Persistent mild inflammatory change around the proximal sigmoid colon. No abscess. No free air. Urinary bladder is  physiologically distended. Moderate prostatic enlargement with central coarse calcifications. No ascites. No adenopathy. Advanced degenerative disc disease L5-S1 with posterior protrusion.  IMPRESSION: 1. 5.8 cm fusiform infrarenal abdominal aortic aneurysm without evidence of rupture or other complicating features. Vascular surgery consultation recommended due to increased  risk of rupture for AAA >5.5 cm. This recommendation follows ACR consensus. This recommendation follows ACR consensus guidelines: White Paper of the ACR Incidental Findings Committee II on Vascular Findings. J Am Coll Radiol 2013; 10:789-794. 2. Tandem stenoses in the proximal and distal left common iliac artery. 3. Nonspecific right hilar and mediastinal adenopathy. 4. Patchy airspace infiltrate in the posterolateral right lower lobe. 5. Splenomegaly 6. Cholelithiasis 7. Stable left adrenal nodule, probably benign adenoma in the absence of a history of primary carcinoma. 8. Stable mild sigmoid diverticulitis without abscess.   Electronically Signed   By: Arne Cleveland M.D.   On: 06/07/2013 11:27   Anti-infectives: Anti-infectives   Start     Dose/Rate Route Frequency Ordered Stop   07/02/13 1700  vancomycin (VANCOCIN) IVPB 1000 mg/200 mL premix     1,000 mg 200 mL/hr over 60 Minutes Intravenous Every 12 hours 07/02/13 1419 07/03/13 0753   07/02/13 1127  cefUROXime (ZINACEF) 1.5 g in dextrose 5 % 50 mL IVPB  Status:  Discontinued     1.5 g 100 mL/hr over 30 Minutes Intravenous 60 min pre-op 07/02/13 1127 07/02/13 1418   07/01/13 1427  cefUROXime (ZINACEF) 1.5 g in dextrose 5 % 50 mL IVPB     1.5 g 100 mL/hr over 30 Minutes Intravenous 30 min pre-op 07/01/13 1427 07/02/13 1141      Assessment/Plan: s/p Procedure(s): ABDOMINAL AORTIC ENDOVASCULAR STENT GRAFT (N/A) Stable postop day #1. No complications from stent graft repair. Will discharge to home today. H&H stable after 2 units packed cells yesterday.   LOS: 1 day   EARLY, TODD 07/03/2013, 8:50 AM

## 2013-07-03 NOTE — Progress Notes (Signed)
Dr. Donnetta Hutching notified that pt has been having intermittent, Parkinsonian-like tremors for several hours -- confirmed with pt and wife that he doesn't consume any alcohol. No abdominal pain reported - no new orders received. Connye Burkitt, RN

## 2013-07-05 ENCOUNTER — Encounter (HOSPITAL_COMMUNITY): Payer: Self-pay | Admitting: Surgery

## 2013-07-07 ENCOUNTER — Ambulatory Visit
Admission: RE | Admit: 2013-07-07 | Discharge: 2013-07-07 | Disposition: A | Discharge status: Home / Self Care | Payer: Medicare Other | Source: Ambulatory Visit | Attending: Surgery | Admitting: Surgery

## 2013-07-07 ENCOUNTER — Other Ambulatory Visit: Payer: Self-pay | Admitting: *Deleted

## 2013-07-07 ENCOUNTER — Encounter: Payer: Self-pay | Admitting: *Deleted

## 2013-07-07 DIAGNOSIS — Z9889 Other specified postprocedural states: Secondary | ICD-10-CM

## 2013-07-07 DIAGNOSIS — R5082 Postprocedural fever: Secondary | ICD-10-CM

## 2013-07-07 DIAGNOSIS — G8918 Other acute postprocedural pain: Secondary | ICD-10-CM

## 2013-07-07 MED ORDER — IOHEXOL 350 MG/ML SOLN
75.0000 mL | Freq: Once | INTRAVENOUS | Status: AC | PRN
  Administered 2013-07-07: 75 mL via INTRAVENOUS

## 2013-07-07 MED ORDER — IOHEXOL 300 MG/ML  SOLN
30.0000 mL | Freq: Once | INTRAMUSCULAR | Status: AC | PRN
  Administered 2013-07-07: 30 mL via ORAL

## 2013-07-07 MED ORDER — OXYCODONE HCL 5 MG PO TABS: 5.0000 mg | ORAL_TABLET | Freq: Four times a day (QID) | ORAL | Status: DC | PRN

## 2013-07-07 NOTE — Progress Notes (Signed)
Patient's wife called this morning; he is having nightly fevers of 101.7 and 102.6 for the past 4-5 days. He is S/P EVAR and cholecystectomy on 07-02-13;  I paged Dr. Trula Slade who changed pain meds to oxycodone and he also ordered abdominal  CTA to rule out infection.

## 2013-07-07 NOTE — Progress Notes (Signed)
Patient came in to our office after having his CTA of the abdomen. He still has no abdominal distention or pain.  Dr. Scot Dock looked at this and said that it showed no infection or pockets of fluid around the stent graft. There was a small area of stranding, possible diverticulitis noted by the radiologist. I informed the patient and his wife of this per Dr. Nicole Cella order. I advised [name] to go to the ED if his temperature was still elevated so that he may be evaluated by GI.  Dr. Scot Dock was concerned that any infection could possibly spread to the EVAR stent graft and cause future problems. Wife said that they would contact Dr. Delfin Edis for instructions or antibiotic care and they both voiced understanding that the patient should go to the ED if temperature persists.   Leota Jacobsen, RN

## 2013-07-07 NOTE — Telephone Encounter (Signed)
Pt called asking about antibiotic therapy. He has not beem seen for 10 days while in hospital for cholecystitis.  This appears to be a new problem with some concern about a vascular graft.  He will need to be evaluated in the office by Dr Olevia Perches or an extender.

## 2013-07-08 ENCOUNTER — Telehealth: Payer: Self-pay | Admitting: *Deleted

## 2013-07-08 ENCOUNTER — Telehealth: Payer: Self-pay | Admitting: Internal Medicine

## 2013-07-08 DIAGNOSIS — N390 Urinary tract infection, site not specified: Secondary | ICD-10-CM

## 2013-07-08 MED ORDER — SULFAMETHOXAZOLE-TRIMETHOPRIM 400-80 MG PO TABS: 1.0000 | ORAL_TABLET | Freq: Two times a day (BID) | ORAL | Status: DC

## 2013-07-08 MED ORDER — CIPROFLOXACIN HCL 500 MG PO TABS: 500.0000 mg | ORAL_TABLET | Freq: Two times a day (BID) | ORAL | Status: DC

## 2013-07-08 NOTE — Telephone Encounter (Signed)
I reviewed CT. Impossible for me to give advice on this third party request. Have the patient see a GI extender for proper evaluation. Thank you

## 2013-07-08 NOTE — Telephone Encounter (Signed)
No appointment with extender available until Wednesday. Nicoletta Ba, PA reviewed CT. Per Nicoletta Ba, PA , stop Bactrim. Start Cipro 500 mg po BID x 10 days for mild diverticulitis/UTI. Scheduled with Nicoletta Ba, PA on 07/14/13 at 9:30 AM for OV. If patient has any changes, feels worse or spikes a temp  Call Dr. Trula Slade immediately, do not ignore. Patient's wife aware of recommendations and given OV. Rx sent to pharmacy.

## 2013-07-08 NOTE — Telephone Encounter (Signed)
Spoke with patient's wife. Patient had surgery for aneurysm on 07/02/13. He had a CT yesterday d/t fevers. Dr. Trula Slade put him on Bactrim for UTI and told the patient to call GI to review CT also for ?diverticulitis. Dr. Olevia Perches out of office. Sent to Dr. Henrene Pastor- Doc of day. Please, advise.

## 2013-07-08 NOTE — Telephone Encounter (Signed)
Dr. Trula Slade returned page and ordered Bactrim for this patient. I called Mr. Kimberling and informed him that I called this in to [facility] Dr. Per his request.    Leota Jacobsen, RN

## 2013-07-08 NOTE — Telephone Encounter (Signed)
[name] called in this morning. He just realized that he has burning and urgency with urination. This has been since surgery and seems to be getting worse. His temp last night was 100-101 with Tylenol. This morning it is 99 per patient's wife. Patient is requesting PO antibiotic for possible UTI. ( Allergic to Cephalexin).  I paged Dr. Trula Slade with this information.   Leota Jacobsen, RN

## 2013-07-12 ENCOUNTER — Telehealth: Payer: Self-pay | Admitting: Cardiovascular Disease

## 2013-07-12 ENCOUNTER — Encounter (INDEPENDENT_AMBULATORY_CARE_PROVIDER_SITE_OTHER): Payer: Self-pay | Admitting: Surgery

## 2013-07-12 ENCOUNTER — Ambulatory Visit (INDEPENDENT_AMBULATORY_CARE_PROVIDER_SITE_OTHER): Payer: Medicare Other | Admitting: Surgery

## 2013-07-12 VITALS — BP 101/69 | HR 72 | Temp 97.7°F | Resp 18 | Ht 70.0 in | Wt 176.4 lb

## 2013-07-12 DIAGNOSIS — Z09 Encounter for follow-up examination after completed treatment for conditions other than malignant neoplasm: Secondary | ICD-10-CM

## 2013-07-12 NOTE — Telephone Encounter (Signed)
New message    Wife calling C/O blood pressure is low 101/69 seen at PCP . Please advise on medication restarting losartan.

## 2013-07-12 NOTE — Telephone Encounter (Signed)
I spoke with the pt and his wife about BP.  The pt's only complaint is fatigue.  The pt's pulse is running in the upper 70's.  I advised the pt that he should not restart Losartan, but continue Metoprolol. The pt is s/p 2 surgeries (aortic stent graft and cholecystectomy)  and lower BP can be related to fluid changes. I told the pt to increase his water intake and make sure he remains well hydrated. Pt agreed with plan. The pt will contact the office if his BP is higher that 140/90 and then we will restart medications.

## 2013-07-12 NOTE — Progress Notes (Signed)
Subjective:     Patient ID: [name], male   DOB: 11/11/1945, 68 y.o.   MRN: [id number]  HPI He is here for his first postop visit status post laparoscopic cholecystectomy. He has since had his aneurysm repaired. He is finally starting to improve from a nausea standpoint. It is still uncertain whether the gallbladder because of the nausea and vomiting  Review of Systems     Objective:   Physical Exam On exam, his abdomen is soft and nontender and his incisions are well-healed. The final pathology on the gallbladder showed chronic cholecystitis with gallstones    Assessment:     Patient stable postop     Plan:     From a gallbladder standpoint, he may resume his normal activity. I will see him back as needed

## 2013-07-14 ENCOUNTER — Ambulatory Visit (INDEPENDENT_AMBULATORY_CARE_PROVIDER_SITE_OTHER): Payer: Medicare Other | Admitting: Physician Assistant

## 2013-07-14 ENCOUNTER — Encounter: Payer: Self-pay | Admitting: Physician Assistant

## 2013-07-14 VITALS — BP 112/60 | HR 68 | Ht 70.0 in | Wt 176.2 lb

## 2013-07-14 DIAGNOSIS — I2510 Atherosclerotic heart disease of native coronary artery without angina pectoris: Secondary | ICD-10-CM

## 2013-07-14 DIAGNOSIS — K5732 Diverticulitis of large intestine without perforation or abscess without bleeding: Secondary | ICD-10-CM

## 2013-07-14 MED ORDER — CIPROFLOXACIN HCL 500 MG PO TABS: 500.0000 mg | ORAL_TABLET | Freq: Two times a day (BID) | ORAL | Status: AC

## 2013-07-14 MED ORDER — RESTORA PO CAPS: 1.0000 | ORAL_CAPSULE | Freq: Every day | ORAL | Status: DC

## 2013-07-14 NOTE — Patient Instructions (Signed)
We sent a prescription for Cipro for 4 more days to [facility], [facility] Dr. We have given you samples of Restora, Take 1 capsule daily for 10 days. You can get this at [facility] over the counter and take it for another 2- weeks.  You will receive a letter from [place] regarding an office visit with Dr. Fuller Plan in  Januart 2016  to discuss a colonoscopy.

## 2013-07-14 NOTE — Discharge Summary (Signed)
[facility] Discharge Summary   Patient ID:  [name] MRN: [id number] DOB/AGE: 1945-04-10 68 y.o.  Admit date: 07/02/2013 Discharge date: 07/14/2013 Date of Surgery: 07/02/2013 Surgeon: Surgeon(s): Serafina Mitchell, MD  Admission Diagnosis: Abdominal Aortic Aneurysm  Discharge Diagnoses:  Abdominal Aortic Aneurysm  Secondary Diagnoses: Past Medical History  Diagnosis Date  . Coronary atherosclerosis of native coronary artery     a. 10/2008 inf STEMI/PCI: LM 50d (IVUS-borderline lesion->med rx), LAD min irregs, LCX 45m, 70d, OM nl, RCA 165m (3.5x28 Vision BMS);  b. 11/2008 Lexiscan MV: EF 65%, no isch/scar.  . Essential hypertension, benign   . Pure hypercholesterolemia   . AAA (abdominal aortic aneurysm)     a. 05/2013 CT: 5.8 cm AAA.  Marland Kitchen Diverticulitis     a. 05/2013 CT: descending/sigmoid jxn w/o abscess.  . Osteoarthritis   . Tobacco abuse     a. ongoing - 1ppd for better part of 50 yrs.  . Normocytic anemia   . Cellulitis 06/10/2013    Right antecubital fossa at site of IV  03/12/13  . PONV (postoperative nausea and vomiting)     Procedure(s): ABDOMINAL AORTIC ENDOVASCULAR STENT GRAFT  Discharged Condition: good  HPI: This is a 68 yo male who presented to the ER with abdominal pain and nausea earlier this am. His symptoms were present for approximately 36 hours. His sx'x began following eating [place] food. He developed A rash across his face secondary to violent emesis. He was diagnosed with an allergic reaction and sent home. He represented via EMS later today with dry heaving and diffuse abdominal pain. A non-contrast CT scan was obtained as he had a slight increase in his creatinine. This revealed mild diverticulitis at the descending colon / sigmoid junction and a 5.8cm AAA. With medication, his abdominal pain and nausea resolved.  The patient has a history of CAD and is followed by Dr. Burt Knack. He has a history of an inferior wall MI in 2010,  treated with PCI to the RCA. He ahd moderate distal left main and moderate LCX disease which were treated medically. A follow up myoview was negative for significant ischemia. He is scheduled for myoview in July. He denies active chest pain. He also describes difficulty with walking at approximately 10 minutes. He gets pain in his thigh and feels as if his legs are going to give out. His symptoms are alleviated with rest. He has attributed this to lower back pain and sciatica.  The patient is a current smoker. He is medically managed for hypertension with an ACE inhibitor. He is medically treated for hypercholesterolemia with a statin. He takes ASA 81mg  as antiplatelet therapy. He has mild renal insufficiency. His creatinine in the ER was 1.38 with a GFR of 52. This could be secondary to dehydration given his recent n/v. He has been diagnosed with COPD. He is not on O2.     Hospital Course:  [name] is a 68 y.o. male is S/P  Procedure(s): ABDOMINAL AORTIC ENDOVASCULAR STENT GRAFT He had an uneventful night without nausea or vomitting.  He ambulated in the halls, taking PO's well, and has independently voided.  He is being discharged home 07/03/2013.  H&H stable after 2 units packed cells yesterday.  Extubated: POD # 1 Physical exam: Abdomen soft and nontender, groin puncture site without hematoma. Palpable popliteal pulses bilaterally  Post-op wounds healing well Pt. Ambulating, voiding and taking PO diet without difficulty. Pt pain controlled with PO pain meds. Labs  as below Complications:acute post-op anemia.  Consults:     Significant Diagnostic Studies: CBC Lab Results  Component Value Date   WBC 3.5* 07/03/2013   HGB 7.9* 07/03/2013   HCT 23.7* 07/03/2013   MCV 85.9 07/03/2013   PLT 86* 07/03/2013    BMET    Component Value Date/Time   NA 137 07/03/2013 0445   K 3.5* 07/03/2013 0445   CL 99 07/03/2013 0445   CO2 21 07/03/2013 0445   GLUCOSE 110* 07/03/2013 0445   BUN 10  07/03/2013 0445   CREATININE 1.15 07/03/2013 0445   CALCIUM 8.5 07/03/2013 0445   GFRNONAA 65* 07/03/2013 0445   GFRAA 75* 07/03/2013 0445   COAG Lab Results  Component Value Date   INR 1.34 07/02/2013   INR 1.20 07/02/2013   INR 1.24 06/05/2013     Disposition:  Discharge to :Home Discharge Instructions   Call MD for:  redness, tenderness, or signs of infection (pain, swelling, bleeding, redness, odor or green/yellow discharge around incision site)    Complete by:  As directed      Call MD for:  redness, tenderness, or signs of infection (pain, swelling, bleeding, redness, odor or green/yellow discharge around incision site)    Complete by:  As directed      Call MD for:  severe or increased pain, loss or decreased feeling  in affected limb(s)    Complete by:  As directed      Call MD for:  severe or increased pain, loss or decreased feeling  in affected limb(s)    Complete by:  As directed      Call MD for:  temperature >100.5    Complete by:  As directed      Call MD for:  temperature >100.5    Complete by:  As directed      Discharge instructions    Complete by:  As directed   May wash with soap and water in 24 hours. Slowly increase your activity.  Walk for exercise.     Driving Restrictions    Complete by:  As directed   No driving for 2 weeks     Driving Restrictions    Complete by:  As directed   No driving for 2 weeks     Lifting restrictions    Complete by:  As directed   No lifting for 8 weeks     Lifting restrictions    Complete by:  As directed   No lifting for 2 weeks     Resume previous diet    Complete by:  As directed      Resume previous diet    Complete by:  As directed             Medication List         metoCLOPramide 10 MG tablet  Commonly known as:  REGLAN  Take 1 tablet (10 mg total) by mouth 4 (four) times daily.     metoprolol tartrate 25 MG tablet  Commonly known as:  LOPRESSOR  Take 0.5 tablets (12.5 mg total) by mouth 2 (two) times  daily.     nicotine 14 mg/24hr patch  Commonly known as:  NICODERM CQ - dosed in mg/24 hours  Place 14 mg onto the skin daily.     nitroGLYCERIN 0.4 MG SL tablet  Commonly known as:  NITROSTAT  Place 0.4 mg under the tongue every 5 (five) minutes as needed for chest pain.     ondansetron  4 MG disintegrating tablet  Commonly known as:  ZOFRAN ODT  Take 1 tablet (4 mg total) by mouth every 6 (six) hours as needed for nausea or vomiting.     ondansetron 4 MG tablet  Commonly known as:  ZOFRAN  Take 1 tablet (4 mg total) by mouth every 8 (eight) hours as needed for nausea or vomiting.     oxyCODONE-acetaminophen 5-325 MG per tablet  Commonly known as:  PERCOCET/ROXICET  Take 1 tablet by mouth every 6 (six) hours as needed.     rosuvastatin 10 MG tablet  Commonly known as:  CRESTOR  Take 10 mg by mouth daily.     sucralfate 1 GM/10ML suspension  Commonly known as:  CARAFATE  Take 1 g by mouth 2 (two) times daily.       Verbal and written Discharge instructions given to the patient. Wound care per Discharge AVS   Signed: Laurence Slate [facility] 07/14/2013, 9:25 AM  - For VQI Registry use --- Instructions: Press F2 to tab through selections.  Delete question if not applicable.   Post-op:  Time to Extubation: [x ] In OR, [ ]  < 12 hrs, [ ]  12-24 hrs, [ ]  >=24 hrs Vasopressors Req. Post-op: No MI: [x ] No, [ ]  Troponin only, [ ]  EKG or Clinical New Arrhythmia: No CHF: No ICU Stay: 0 days 1 day in stepdown unit. Transfusion: Yes  If yes, 2 units given  Complications: Resp failure: [x ] none, [ ]  Pneumonia, [ ]  Ventilator Chg in renal function: [x ] none, [ ]  Inc. Cr > 0.5, [ ]  Temp. Dialysis, [ ]  Permanent dialysis Leg ischemia: [x ] No, [ ]  Yes, no Surgery needed, [ ]  Yes, Surgery needed, [ ]  Amputation Bowel ischemia: [x ] No, [ ]  Medical Rx, [ ]  Surgical Rx Wound complication: [x ] No, [ ]  Superficial separation/infection, [ ]  Return to OR Return to OR: No  Return to OR  for bleeding: No Stroke: [x ] None, [ ]  Minor, [ ]  Major  Discharge medications: Statin use:  Yes ASA use:  No  for medical reason   Plavix use:  No for medical reasons Beta blocker use:  Yes

## 2013-07-14 NOTE — Progress Notes (Signed)
Reviewed and agree with management plan.  Dim Meisinger T. Syniah Berne, MD FACG 

## 2013-07-14 NOTE — Progress Notes (Signed)
Subjective:    Patient ID: [name], male    DOB: [birth year], 68 y.o.   MRN: [id number]  HPI [name] is a pleasant 68 year old white male who was seen in hospital consultation by Dr. Fuller Plan in May of 2015 at which time he was treated for acute diverticulitis. He was to be scheduled for colonoscopy at a later date. Unfortunately patient had a second hospitalization in early June of 2015 was diagnosed with cholecystitis and underwent a cholecystectomy. As part of that workup also identified a large abdominal aortic aneurysm and he had aortic stent graft repair by Dr. Bettina Gavia on 07/02/2013. Patient states that after he left the hospital he started having problems with night sweats on a daily basis and had some fevers to 101 range at home. He had some mild lower abdominal generalized discomfort. He called the vascular surgeon and CT scan of the abdomen and pelvis was obtained on 07/07/2013 this showed the aorto bi- iliac stent graft to be intact without evidence of leak there was some mild stranding in the retroperitoneal fat, IMA occluded, he also had sigmoid diverticulosis with subtle stranding adjacent to the sigmoid colon suggestive of early diverticulitis. Our office was contacted for advice and he was called in a prescription for Cipro 500 mg by mouth twice a day with plans for office followup. He comes in today stating that he is feeling better he remains week from his surgeries. His appetite is improving he is able to eat and he has no complaints of abdominal pain. He has not had any further fevers chills or sweats. Bowels are moving no diarrhea melena or hematochezia. He does relate a lot of epigastric belching and burping which is new for him in he thinks possibly from the antibiotics. He has no complaints of heartburn indigestion or dysphagia.   Review of Systems  Constitutional: Negative.   HENT: Negative.   Eyes: Negative.   Respiratory: Negative.   Cardiovascular: Negative.     Gastrointestinal: Positive for abdominal pain.  Endocrine: Negative.   Genitourinary: Negative.   Musculoskeletal: Negative.   Skin: Negative.   Allergic/Immunologic: Negative.   Neurological: Negative.   Hematological: Negative.   Psychiatric/Behavioral: Negative.    Outpatient Prescriptions Prior to Visit  Medication Sig Dispense Refill  . ciprofloxacin (CIPRO) 500 MG tablet Take 1 tablet (500 mg total) by mouth 2 (two) times daily.  20 tablet  0  . metoCLOPramide (REGLAN) 10 MG tablet Take 1 tablet (10 mg total) by mouth 4 (four) times daily.  20 tablet  0  . metoprolol tartrate (LOPRESSOR) 25 MG tablet Take 0.5 tablets (12.5 mg total) by mouth 2 (two) times daily.  90 tablet  3  . nicotine (NICODERM CQ - DOSED IN MG/24 HOURS) 14 mg/24hr patch Place 14 mg onto the skin daily.      . nitroGLYCERIN (NITROSTAT) 0.4 MG SL tablet Place 0.4 mg under the tongue every 5 (five) minutes as needed for chest pain.      Marland Kitchen ondansetron (ZOFRAN ODT) 4 MG disintegrating tablet Take 1 tablet (4 mg total) by mouth every 6 (six) hours as needed for nausea or vomiting.  15 tablet  0  . oxyCODONE (OXY IR/ROXICODONE) 5 MG immediate release tablet Take 1 tablet (5 mg total) by mouth every 6 (six) hours as needed for moderate pain.  20 tablet  0  . oxyCODONE-acetaminophen (PERCOCET/ROXICET) 5-325 MG per tablet Take 1 tablet by mouth every 6 (six) hours as needed.  [place]  tablet  0  . rosuvastatin (CRESTOR) 10 MG tablet Take 10 mg by mouth daily.      . sucralfate (CARAFATE) 1 GM/10ML suspension Take 1 g by mouth 2 (two) times daily.      . ondansetron (ZOFRAN) 4 MG tablet Take 1 tablet (4 mg total) by mouth every 8 (eight) hours as needed for nausea or vomiting.  20 tablet  0   No facility-administered medications prior to visit.   Allergies  Allergen Reactions  . Cephalexin Other (See Comments)    Nausea and stomach cramps   Patient Active Problem List   Diagnosis Date Noted  . Diverticulitis of colon  without hemorrhage 07/14/2013  . AAA (abdominal aortic aneurysm) 07/02/2013  . Chronic cholecystitis 06/21/2013  . Symptomatic cholelithiasis 06/18/2013  . Malnutrition of moderate degree 06/15/2013  . PNA (pneumonia) 06/13/2013  . Diverticulitis 06/05/2013  . AAA (abdominal aortic aneurysm) without rupture 06/05/2013  . BRADYCARDIA 12/20/2008  . HYPERLIPIDEMIA 11/11/2008  . TOBACCO ABUSE 11/11/2008  . HYPERTENSION, UNSPECIFIED 11/11/2008  . MYOCARDIAL INFARCTION, HX OF 11/11/2008  . Coronary atherosclerosis of native coronary artery 11/11/2008   History  Substance Use Topics  . Smoking status: Former Smoker -- 1.00 packs/day for 50 years    Types: Cigarettes    Quit date: 06/05/2013  . Smokeless tobacco: Never Used  . Alcohol Use: No   family history includes Cancer in his father and mother; Heart attack in his brother.     Objective:   Physical Exam Well-developed older white male in no acute distress, pleasant blood pressure 112/60 pulse 68 height 5 foot 10 weight 176. HEENT; nontraumatic normocephalic EOMI PERRLA sclera anicteric, Supple; no JVD, Cardiovascular; regular rate and rhythm with S1-S2 no murmur or gallop, Pulmonary; clear bilaterally, Abdomen; soft incisional reports from cholecystectomy healing he has no focal tenderness no guarding or rebound no palpable mass or hepatosplenomegaly bowel sounds are present, Rectal; exam not done, Ext;is no clubbing cyanosis or edema skin warm and dry, Psych ;mood and affect appropriate      Assessment & Plan:  #8  68 year old male with resolving diverticulitis, patient also had an episode of diverticulitis May 2015 #2 status post recent cholecystectomy June 2015 #3 status post aortic aneurysm repair with aortic stent graft on 07/02/2013 #4 coronary artery disease history of MI  Plan; patient is asked to complete a 14 day course of Cipro 500 mg by mouth twice daily Will give him samples of Restora  and ask him to take a  probiotic  Daily over the next 2-3 weeks Amy use Mylanta or Gas-X were belching He is advised that he should call after he finishes the antibiotics if he has any recurrence of abdominal discomfort, fever , chills or sweats. We will plan for followup with Dr. Fuller Plan in about 6 months and consider colonoscopy at that time this will allow him to recuperate from his 2 recent surgeries.

## 2013-07-14 NOTE — Discharge Summary (Signed)
I agree with the above  Wells Merek Niu 

## 2013-07-27 ENCOUNTER — Other Ambulatory Visit (HOSPITAL_COMMUNITY): Payer: Self-pay

## 2013-07-27 MED ORDER — ROSUVASTATIN CALCIUM 10 MG PO TABS: 10.0000 mg | ORAL_TABLET | Freq: Every day | ORAL | Status: DC

## 2013-07-30 ENCOUNTER — Telehealth: Payer: Self-pay | Admitting: Internal Medicine

## 2013-07-30 NOTE — Telephone Encounter (Signed)
Patient's wife wanted to make sure that it was ok to take oral iron with the patient having a history of diverticulitis.  She is advised ok to take iron and if he experiences constipation he should take a stool softener.  She will call back for any additional questions or concerns

## 2013-08-02 ENCOUNTER — Emergency Department (HOSPITAL_COMMUNITY)
Admission: EM | Admit: 2013-08-02 | Discharge: 2013-08-03 | Disposition: A | Discharge status: Home / Self Care | Payer: Medicare Other | Source: Home / Self Care | Attending: Emergency Medicine | Admitting: Emergency Medicine

## 2013-08-02 ENCOUNTER — Encounter (HOSPITAL_COMMUNITY): Payer: Self-pay | Admitting: Emergency Medicine

## 2013-08-02 ENCOUNTER — Emergency Department (HOSPITAL_COMMUNITY): Payer: Medicare Other

## 2013-08-02 ENCOUNTER — Telehealth: Payer: Self-pay | Admitting: Physician Assistant

## 2013-08-02 DIAGNOSIS — K2970 Gastritis, unspecified, without bleeding: Secondary | ICD-10-CM | POA: Diagnosis not present

## 2013-08-02 DIAGNOSIS — R1013 Epigastric pain: Secondary | ICD-10-CM

## 2013-08-02 DIAGNOSIS — K2990 Gastroduodenitis, unspecified, without bleeding: Secondary | ICD-10-CM | POA: Diagnosis not present

## 2013-08-02 DIAGNOSIS — E860 Dehydration: Secondary | ICD-10-CM | POA: Diagnosis not present

## 2013-08-02 DIAGNOSIS — R112 Nausea with vomiting, unspecified: Secondary | ICD-10-CM

## 2013-08-02 LAB — COMPREHENSIVE METABOLIC PANEL
ALT: 11 U/L (ref 0–53)
AST: 15 U/L (ref 0–37)
Albumin: 3.7 g/dL (ref 3.5–5.2)
Alkaline Phosphatase: 93 U/L (ref 39–117)
Anion gap: 14 (ref 5–15)
BUN: 18 mg/dL (ref 6–23)
CALCIUM: 9.4 mg/dL (ref 8.4–10.5)
CO2: 23 mEq/L (ref 19–32)
Chloride: 100 mEq/L (ref 96–112)
Creatinine, Ser: 1.21 mg/dL (ref 0.50–1.35)
GFR calc Af Amer: 70 mL/min — ABNORMAL LOW (ref >90)
GFR calc non Af Amer: 61 mL/min — ABNORMAL LOW (ref >90)
GLUCOSE: 110 mg/dL — AB (ref 70–99)
Potassium: 3.9 mEq/L (ref 3.7–5.3)
SODIUM: 137 meq/L (ref 137–147)
Total Bilirubin: 0.6 mg/dL (ref 0.3–1.2)
Total Protein: 7.6 g/dL (ref 6.0–8.3)

## 2013-08-02 LAB — CBC WITH DIFFERENTIAL/PLATELET
Basophils Absolute: 0 10*3/uL (ref 0.0–0.1)
Basophils Relative: 0 % (ref 0–1)
Eosinophils Absolute: 0 10*3/uL (ref 0.0–0.7)
Eosinophils Relative: 1 % (ref 0–5)
HEMATOCRIT: 25.3 % — AB (ref 39.0–52.0)
HEMOGLOBIN: 8.1 g/dL — AB (ref 13.0–17.0)
LYMPHS ABS: 0.8 10*3/uL (ref 0.7–4.0)
LYMPHS PCT: 17 % (ref 12–46)
MCH: 28.5 pg (ref 26.0–34.0)
MCHC: 32 g/dL (ref 30.0–36.0)
MCV: 89.1 fL (ref 78.0–100.0)
MONO ABS: 0.6 10*3/uL (ref 0.1–1.0)
Monocytes Relative: 13 % — ABNORMAL HIGH (ref 3–12)
Neutro Abs: 3 10*3/uL (ref 1.7–7.7)
Neutrophils Relative %: 69 % (ref 43–77)
PLATELETS: 118 10*3/uL — AB (ref 150–400)
RBC: 2.84 MIL/uL — AB (ref 4.22–5.81)
RDW: 19 % — ABNORMAL HIGH (ref 11.5–15.5)
WBC: 4.4 10*3/uL (ref 4.0–10.5)

## 2013-08-02 LAB — I-STAT TROPONIN, ED: Troponin i, poc: 0 ng/mL (ref 0.00–0.08)

## 2013-08-02 LAB — LIPASE, BLOOD: Lipase: 32 U/L (ref 11–59)

## 2013-08-02 MED ORDER — ONDANSETRON HCL 4 MG/2ML IJ SOLN
4.0000 mg | Freq: Once | INTRAMUSCULAR | Status: AC
  Administered 2013-08-02: 4 mg via INTRAVENOUS
  Filled 2013-08-02: qty 2

## 2013-08-02 MED ORDER — SODIUM CHLORIDE 0.9 % IV SOLN
1000.0000 mL | Freq: Once | INTRAVENOUS | Status: AC
  Administered 2013-08-02: 1000 mL via INTRAVENOUS

## 2013-08-02 MED ORDER — PROMETHAZINE HCL 25 MG/ML IJ SOLN
12.5000 mg | Freq: Once | INTRAMUSCULAR | Status: AC
  Administered 2013-08-02: 12.5 mg via INTRAVENOUS
  Filled 2013-08-02: qty 1

## 2013-08-02 NOTE — ED Notes (Signed)
EMS called to home. Found patient ambulatory with complaints of upper abdominal pain. Patient states that he has been having this pain intermittently for 2 weeks.  Patient adds That he has had his gallbladder previously removed.  Currently he rates his pain 6 of 10  With nausea and vomiting.

## 2013-08-02 NOTE — Telephone Encounter (Signed)
Spoke with patient's wife and she states he has been vomiting all day and cannot keep Phenergan down. Suggested she take him to ED or Urgent care to be evaluated because he may need IVF. She will do this.

## 2013-08-02 NOTE — ED Notes (Signed)
Pt has chronic abdominal pain-states increased abdominal pain and nausea and vomiting all day today-ambulatory/#18 left [facility], EMS gave Zofran 4 mg IV

## 2013-08-02 NOTE — ED Notes (Signed)
Pt states he still cannot urinate. Pt said he did not drink very much today and does not feel like he can produce any urine.

## 2013-08-02 NOTE — ED Notes (Signed)
Pt has urinal at the bedside.

## 2013-08-02 NOTE — ED Notes (Signed)
Bed: WA14 Expected date:  Expected time:  Means of arrival:  Comments: EMS 

## 2013-08-03 ENCOUNTER — Other Ambulatory Visit: Payer: Self-pay

## 2013-08-03 DIAGNOSIS — E785 Hyperlipidemia, unspecified: Secondary | ICD-10-CM

## 2013-08-03 LAB — URINALYSIS, ROUTINE W REFLEX MICROSCOPIC
BILIRUBIN URINE: NEGATIVE
GLUCOSE, UA: NEGATIVE mg/dL
Hgb urine dipstick: NEGATIVE
KETONES UR: NEGATIVE mg/dL
Leukocytes, UA: NEGATIVE
NITRITE: NEGATIVE
Protein, ur: NEGATIVE mg/dL
Specific Gravity, Urine: 1.01 (ref 1.005–1.030)
Urobilinogen, UA: 0.2 mg/dL (ref 0.0–1.0)
pH: 6.5 (ref 5.0–8.0)

## 2013-08-03 MED ORDER — ONDANSETRON 8 MG PO TBDP
8.0000 mg | ORAL_TABLET | Freq: Once | ORAL | Status: AC
  Administered 2013-08-03: 8 mg via ORAL
  Filled 2013-08-03: qty 1

## 2013-08-03 MED ORDER — METOPROLOL TARTRATE 25 MG PO TABS: 12.5000 mg | ORAL_TABLET | Freq: Two times a day (BID) | ORAL | Status: DC

## 2013-08-03 MED ORDER — ONDANSETRON 4 MG PO TBDP: 4.0000 mg | ORAL_TABLET | Freq: Three times a day (TID) | ORAL | Status: DC | PRN

## 2013-08-03 MED ORDER — OMEPRAZOLE 20 MG PO CPDR: 20.0000 mg | DELAYED_RELEASE_CAPSULE | Freq: Every day | ORAL | Status: DC

## 2013-08-03 MED ORDER — PROMETHAZINE HCL 25 MG RE SUPP: 25.0000 mg | Freq: Four times a day (QID) | RECTAL | Status: DC | PRN

## 2013-08-03 NOTE — ED Provider Notes (Signed)
CSN: [id number]     Arrival date & time 08/02/13  1921 History   First MD Initiated Contact with Patient 08/02/13 1945     Chief Complaint  Patient presents with  . Abdominal Pain   HPI Comments: Patient is a 68 y.o. Male who presents to the [facility] ED with nausea and vomiting.  Patient states that he woke up with some mild epigastric pain and then developed severe nausea with vomiting.  Patient states that he has vomited 10-15 times today.  He tried phenergan and zofran PO at home with no relief of symptoms.  Patient states that he has been vomiting yellow green material and has not been able to keep down any fluids at this time.  Patient denies hematemesis, hematochezia, diarrhea, melena, constipation, changes in urinary habits, headache, dizziness, shortness of breath, chest pain, or any other symptoms at this time.  Patient is currently pain free.  Patient has recently had a cholecystectomy and a AAA bypass approximately two months ago which required a one month hospitalization.  He has been on zofran, reglan, and sucralfate since the surgery.      Patient is a 68 y.o. male presenting with abdominal pain. The history is provided by the patient. No language interpreter was used.  Abdominal Pain Associated symptoms: fatigue, nausea and vomiting   Associated symptoms: no chest pain, no chills, no constipation, no cough, no diarrhea, no dysuria, no fever and no shortness of breath     Past Medical History  Diagnosis Date  . Coronary atherosclerosis of native coronary artery     a. 10/2008 inf STEMI/PCI: LM 50d (IVUS-borderline lesion->med rx), LAD min irregs, LCX 55m, 70d, OM nl, RCA 119m (3.5x28 Vision BMS);  b. 11/2008 Lexiscan MV: EF 65%, no isch/scar.  . Essential hypertension, benign   . Pure hypercholesterolemia   . AAA (abdominal aortic aneurysm)     a. 05/2013 CT: 5.8 cm AAA.  Marland Kitchen Diverticulitis     a. 05/2013 CT: descending/sigmoid jxn w/o abscess.  . Osteoarthritis   . Tobacco abuse     a.  ongoing - 1ppd for better part of 50 yrs.  . Normocytic anemia   . Cellulitis 06/10/2013    Right antecubital fossa at site of IV  03/12/13  . PONV (postoperative nausea and vomiting)    Past Surgical History  Procedure Laterality Date  . Cardiac stents  2010  . Cholecystectomy N/A 06/19/2013    Procedure: LAPAROSCOPIC CHOLECYSTECTOMY;  Surgeon: Harl Bowie, MD;  Location: [place];  Service: General;  Laterality: N/A;  . Abdominal aortic endovascular stent graft N/A 07/02/2013    Procedure: ABDOMINAL AORTIC ENDOVASCULAR STENT GRAFT;  Surgeon: Serafina Mitchell, MD;  Location: [facility];  Service: Vascular;  Laterality: N/A;   Family History  Problem Relation Age of Onset  . Heart attack Brother   . Cancer Father   . Cancer Mother    History  Substance Use Topics  . Smoking status: Former Smoker -- 1.00 packs/day for 50 years    Types: Cigarettes    Quit date: 06/05/2013  . Smokeless tobacco: Never Used  . Alcohol Use: No    Review of Systems  Constitutional: Positive for fatigue. Negative for fever and chills.  Respiratory: Negative for cough, chest tightness and shortness of breath.   Cardiovascular: Negative for chest pain and palpitations.  Gastrointestinal: Positive for nausea, vomiting and abdominal pain. Negative for diarrhea, constipation and blood in stool.  Genitourinary: Negative for dysuria, urgency, frequency and  difficulty urinating.  Musculoskeletal: Negative for back pain.  Neurological: Negative for dizziness, seizures, light-headedness and headaches.  All other systems reviewed and are negative.     Allergies  Cephalexin  Home Medications   Prior to Admission medications   Medication Sig Start Date End Date Taking? Authorizing Provider  ferrous fumarate (HEMOCYTE - 106 MG FE) 325 (106 FE) MG TABS tablet Take 1 tablet by mouth daily.   Yes Historical Provider, MD  metoprolol tartrate (LOPRESSOR) 25 MG tablet Take 0.5 tablets (12.5 mg total) by mouth 2 (two)  times daily. 10/13/12  Yes Sherren Mocha, MD  nitroGLYCERIN (NITROSTAT) 0.4 MG SL tablet Place 0.4 mg under the tongue every 5 (five) minutes as needed for chest pain.   Yes Historical Provider, MD  Probiotic Product (RESTORA) CAPS Take 1 capsule by mouth daily. 07/14/13  Yes Amy S Esterwood, PA-C  rosuvastatin (CRESTOR) 10 MG tablet Take 1 tablet (10 mg total) by mouth daily. 07/27/13  Yes Shaune Pascal Bensimhon, MD  sucralfate (CARAFATE) 1 GM/10ML suspension Take 1 g by mouth 2 (two) times daily.   Yes Historical Provider, MD  omeprazole (PRILOSEC) 20 MG capsule Take 1 capsule (20 mg total) by mouth daily. 08/03/13   Mckade Gurka A Forcucci, PA-C  ondansetron (ZOFRAN ODT) 4 MG disintegrating tablet Take 1 tablet (4 mg total) by mouth every 8 (eight) hours as needed for nausea or vomiting. 08/03/13   Jeanette Rauth A Forcucci, PA-C  promethazine (PHENERGAN) 25 MG suppository Place 1 suppository (25 mg total) rectally every 6 (six) hours as needed for nausea or vomiting. 08/03/13   Haely Leyland A Forcucci, PA-C   BP 96/54  Pulse 58  Temp(Src) 98.3 F (36.8 C) (Oral)  Resp 14  SpO2 98% Physical Exam  Nursing note and vitals reviewed. Constitutional: He is oriented to person, place, and time. He appears well-developed and well-nourished. No distress.  HENT:  Head: Normocephalic and atraumatic.  Mouth/Throat: No oropharyngeal exudate.  Eyes: Conjunctivae and EOM are normal. Pupils are equal, round, and reactive to light. No scleral icterus.  Neck: Normal range of motion. Neck supple. No JVD present. No thyromegaly present.  Cardiovascular: Normal rate, regular rhythm, normal heart sounds and intact distal pulses.  Exam reveals no gallop and no friction rub.   No murmur heard. Pulmonary/Chest: Effort normal and breath sounds normal. No respiratory distress. He has no wheezes. He has no rales. He exhibits no tenderness.  Abdominal: Soft. Bowel sounds are normal. He exhibits no distension and no mass. There is no  tenderness. There is no rebound and no guarding.  Musculoskeletal: Normal range of motion.  Lymphadenopathy:    He has no cervical adenopathy.  Neurological: He is alert and oriented to person, place, and time. No cranial nerve deficit. Coordination normal.  Skin: Skin is warm and dry. He is not diaphoretic.  Psychiatric: He has a normal mood and affect. His behavior is normal. Judgment and thought content normal.    ED Course  Procedures (including critical care time) Labs Review Labs Reviewed  CBC WITH DIFFERENTIAL - Abnormal; Notable for the following:    RBC 2.84 (*)    Hemoglobin 8.1 (*)    HCT 25.3 (*)    RDW 19.0 (*)    Platelets 118 (*)    Monocytes Relative 13 (*)    All other components within normal limits  COMPREHENSIVE METABOLIC PANEL - Abnormal; Notable for the following:    Glucose, Bld 110 (*)    GFR calc non Af Wyvonnia Lora  61 (*)    GFR calc Af Amer 70 (*)    All other components within normal limits  LIPASE, BLOOD  URINALYSIS, ROUTINE W REFLEX MICROSCOPIC  I-STAT TROPOININ, ED    Imaging Review Dg Abd Acute W/chest  08/02/2013   CLINICAL DATA:  Abdominal pain, nausea and vomiting.  EXAM: ACUTE ABDOMEN SERIES (ABDOMEN 2 VIEW & CHEST 1 VIEW)  COMPARISON:  Chest radiograph performed 07/02/2013, and CTA of the abdomen and pelvis performed 07/07/2013  FINDINGS: The lungs are well-aerated and clear. There is no evidence of focal opacification, pleural effusion or pneumothorax. The cardiomediastinal silhouette is within normal limits.  The visualized bowel gas pattern is unremarkable. Scattered stool and air are seen within the colon; there is no evidence of small bowel dilatation to suggest obstruction. No free intra-abdominal air is identified on the provided upright view.  No acute osseous abnormalities are seen; the sacroiliac joints are unremarkable in appearance. An aortoiliac stent graft is noted. Clips are noted within the right upper quadrant, reflecting prior  cholecystectomy.  IMPRESSION: 1. Unremarkable bowel gas pattern; no free intra-abdominal air seen. Small amount of stool in the colon. 2. No acute cardiopulmonary process seen.   Electronically Signed   By: Garald Balding M.D.   On: 08/02/2013 23:06     EKG Interpretation   Date/Time:  Monday August 02 2013 19:30:28 EDT Ventricular Rate:  53 PR Interval:  169 QRS Duration: 92 QT Interval:  485 QTC Calculation: 455 R Axis:   62 Text Interpretation:  Sinus rhythm No significant change since last  tracing Confirmed by GOLDSTON  MD, SCOTT ([id number]) on 08/02/2013 8:47:06 PM      MDM   Final diagnoses:  Non-intractable vomiting with nausea, vomiting of unspecified type  Epigastric abdominal pain   Patient is a 68 y.o. Male who presents to the [facility] ED with nausea and vomiting x 1 day.  Patient is without peritoneal signs at this time and there is no tenderness to palpation.  CBC, CMP, lipase, abdominal xray, and urine here are show anemia, but are unchanged from baseline at this time.  Patient does not have any evidence of SBO at this time.  Patient was treated with zofran, phenergan, and IV fluids.  Patient was able to tolerate PO fluids here.  Patient is stable at this time.  Dr. Regenia Skeeter has also seen this patient alongside me.  I will cover the patient with omeprazole for GERD symptoms, and will also provide phenergan suppository and zofran ODT.  Patient is to follow-up with his PCP and to also follow-up with [facility] as he is unhappy with his care from [place].  Patient states understanding at this time.  He was told to return for worsening abdominal pain or intractable nausea and vomiting.      Cherylann Parr, PA-C 08/03/13 0101

## 2013-08-03 NOTE — Discharge Instructions (Signed)

## 2013-08-03 NOTE — ED Notes (Signed)
Patient is alert and oriented x3.  He was given DC instructions and follow up visit instructions.  Patient gave verbal understanding.  He was DC ambulatory under his own power to home.  V/S stable.  He was not showing any signs of distress on DC 

## 2013-08-04 ENCOUNTER — Inpatient Hospital Stay (HOSPITAL_COMMUNITY)
Admission: EM | Admit: 2013-08-04 | Discharge: 2013-08-06 | DRG: 392 | Disposition: A | Discharge status: Home / Self Care | Payer: Medicare Other | Attending: Family Medicine | Admitting: Family Medicine

## 2013-08-04 ENCOUNTER — Encounter (HOSPITAL_COMMUNITY): Payer: Self-pay | Admitting: Emergency Medicine

## 2013-08-04 DIAGNOSIS — K219 Gastro-esophageal reflux disease without esophagitis: Secondary | ICD-10-CM | POA: Diagnosis present

## 2013-08-04 DIAGNOSIS — F17200 Nicotine dependence, unspecified, uncomplicated: Secondary | ICD-10-CM | POA: Diagnosis present

## 2013-08-04 DIAGNOSIS — I2510 Atherosclerotic heart disease of native coronary artery without angina pectoris: Secondary | ICD-10-CM | POA: Diagnosis present

## 2013-08-04 DIAGNOSIS — R1115 Cyclical vomiting syndrome unrelated to migraine: Secondary | ICD-10-CM

## 2013-08-04 DIAGNOSIS — Z9889 Other specified postprocedural states: Secondary | ICD-10-CM | POA: Diagnosis not present

## 2013-08-04 DIAGNOSIS — D61818 Other pancytopenia: Secondary | ICD-10-CM | POA: Diagnosis present

## 2013-08-04 DIAGNOSIS — K449 Diaphragmatic hernia without obstruction or gangrene: Secondary | ICD-10-CM | POA: Diagnosis present

## 2013-08-04 DIAGNOSIS — I10 Essential (primary) hypertension: Secondary | ICD-10-CM | POA: Diagnosis present

## 2013-08-04 DIAGNOSIS — K2990 Gastroduodenitis, unspecified, without bleeding: Principal | ICD-10-CM

## 2013-08-04 DIAGNOSIS — R1084 Generalized abdominal pain: Secondary | ICD-10-CM

## 2013-08-04 DIAGNOSIS — I739 Peripheral vascular disease, unspecified: Secondary | ICD-10-CM | POA: Diagnosis present

## 2013-08-04 DIAGNOSIS — M1990 Unspecified osteoarthritis, unspecified site: Secondary | ICD-10-CM | POA: Diagnosis present

## 2013-08-04 DIAGNOSIS — K2970 Gastritis, unspecified, without bleeding: Secondary | ICD-10-CM | POA: Diagnosis not present

## 2013-08-04 DIAGNOSIS — E785 Hyperlipidemia, unspecified: Secondary | ICD-10-CM | POA: Diagnosis present

## 2013-08-04 DIAGNOSIS — E860 Dehydration: Secondary | ICD-10-CM

## 2013-08-04 DIAGNOSIS — Z9861 Coronary angioplasty status: Secondary | ICD-10-CM

## 2013-08-04 DIAGNOSIS — R1110 Vomiting, unspecified: Secondary | ICD-10-CM

## 2013-08-04 DIAGNOSIS — I498 Other specified cardiac arrhythmias: Secondary | ICD-10-CM | POA: Diagnosis present

## 2013-08-04 DIAGNOSIS — E7800 Pure hypercholesterolemia, unspecified: Secondary | ICD-10-CM | POA: Diagnosis present

## 2013-08-04 LAB — COMPREHENSIVE METABOLIC PANEL
ALT: 15 U/L (ref 0–53)
ANION GAP: 19 — AB (ref 5–15)
AST: 20 U/L (ref 0–37)
Albumin: 4 g/dL (ref 3.5–5.2)
Alkaline Phosphatase: 99 U/L (ref 39–117)
BUN: 23 mg/dL (ref 6–23)
CALCIUM: 9.3 mg/dL (ref 8.4–10.5)
CO2: 21 mEq/L (ref 19–32)
Chloride: 101 mEq/L (ref 96–112)
Creatinine, Ser: 1.18 mg/dL (ref 0.50–1.35)
GFR, EST AFRICAN AMERICAN: 72 mL/min — AB (ref >90)
GFR, EST NON AFRICAN AMERICAN: 63 mL/min — AB (ref >90)
GLUCOSE: 142 mg/dL — AB (ref 70–99)
Potassium: 3.6 mEq/L — ABNORMAL LOW (ref 3.7–5.3)
Sodium: 141 mEq/L (ref 137–147)
TOTAL PROTEIN: 8.2 g/dL (ref 6.0–8.3)
Total Bilirubin: 0.7 mg/dL (ref 0.3–1.2)

## 2013-08-04 LAB — CBC WITH DIFFERENTIAL/PLATELET
Basophils Absolute: 0 10*3/uL (ref 0.0–0.1)
Basophils Relative: 1 % (ref 0–1)
EOS PCT: 0 % (ref 0–5)
Eosinophils Absolute: 0 10*3/uL (ref 0.0–0.7)
HEMATOCRIT: 26.9 % — AB (ref 39.0–52.0)
HEMOGLOBIN: 8.4 g/dL — AB (ref 13.0–17.0)
LYMPHS ABS: 0.5 10*3/uL — AB (ref 0.7–4.0)
Lymphocytes Relative: 15 % (ref 12–46)
MCH: 28.2 pg (ref 26.0–34.0)
MCHC: 31.2 g/dL (ref 30.0–36.0)
MCV: 90.3 fL (ref 78.0–100.0)
MONO ABS: 0.3 10*3/uL (ref 0.1–1.0)
MONOS PCT: 8 % (ref 3–12)
Neutro Abs: 2.4 10*3/uL (ref 1.7–7.7)
Neutrophils Relative %: 76 % (ref 43–77)
Platelets: 133 10*3/uL — ABNORMAL LOW (ref 150–400)
RBC: 2.98 MIL/uL — AB (ref 4.22–5.81)
RDW: 19.4 % — ABNORMAL HIGH (ref 11.5–15.5)
WBC: 3.2 10*3/uL — ABNORMAL LOW (ref 4.0–10.5)

## 2013-08-04 LAB — MAGNESIUM: MAGNESIUM: 2.1 mg/dL (ref 1.5–2.5)

## 2013-08-04 LAB — I-STAT CG4 LACTIC ACID, ED: Lactic Acid, Venous: 1.38 mmol/L (ref 0.5–2.2)

## 2013-08-04 MED ORDER — ONDANSETRON HCL 4 MG/2ML IJ SOLN
4.0000 mg | Freq: Once | INTRAMUSCULAR | Status: AC
  Administered 2013-08-04: 4 mg via INTRAVENOUS
  Filled 2013-08-04: qty 2

## 2013-08-04 MED ORDER — SODIUM CHLORIDE 0.9 % IV SOLN
INTRAVENOUS | Status: DC
  Administered 2013-08-05: 12:00:00 via INTRAVENOUS

## 2013-08-04 MED ORDER — FAMOTIDINE IN NACL 20-0.9 MG/50ML-% IV SOLN
20.0000 mg | Freq: Once | INTRAVENOUS | Status: AC
  Administered 2013-08-04: 20 mg via INTRAVENOUS
  Filled 2013-08-04: qty 50

## 2013-08-04 MED ORDER — SODIUM CHLORIDE 0.9 % IV BOLUS (SEPSIS)
1000.0000 mL | Freq: Once | INTRAVENOUS | Status: AC
  Administered 2013-08-04: 1000 mL via INTRAVENOUS

## 2013-08-04 MED ORDER — MORPHINE SULFATE 2 MG/ML IJ SOLN
1.0000 mg | INTRAMUSCULAR | Status: DC | PRN
  Administered 2013-08-04 – 2013-08-05 (×3): 2 mg via INTRAVENOUS
  Filled 2013-08-04 (×3): qty 1

## 2013-08-04 MED ORDER — METOPROLOL TARTRATE 12.5 MG HALF TABLET
12.5000 mg | ORAL_TABLET | Freq: Two times a day (BID) | ORAL | Status: DC
  Administered 2013-08-04 – 2013-08-06 (×4): 12.5 mg via ORAL
  Filled 2013-08-04 (×6): qty 1

## 2013-08-04 MED ORDER — SODIUM CHLORIDE 0.9 % IV SOLN
INTRAVENOUS | Status: DC
  Administered 2013-08-04 – 2013-08-05 (×4): via INTRAVENOUS

## 2013-08-04 MED ORDER — SODIUM CHLORIDE 0.9 % IJ SOLN
3.0000 mL | Freq: Two times a day (BID) | INTRAMUSCULAR | Status: DC
  Administered 2013-08-04: 3 mL via INTRAVENOUS

## 2013-08-04 MED ORDER — SUCRALFATE 1 GM/10ML PO SUSP
1.0000 g | Freq: Two times a day (BID) | ORAL | Status: DC
  Administered 2013-08-04 (×2): 1 g via ORAL
  Filled 2013-08-04 (×4): qty 10

## 2013-08-04 MED ORDER — LORAZEPAM 1 MG PO TABS: 1.0000 mg | ORAL_TABLET | Freq: Once | ORAL | Status: DC

## 2013-08-04 MED ORDER — MORPHINE SULFATE 4 MG/ML IJ SOLN
4.0000 mg | Freq: Once | INTRAMUSCULAR | Status: AC
  Administered 2013-08-04: 4 mg via INTRAVENOUS
  Filled 2013-08-04: qty 1

## 2013-08-04 MED ORDER — HYDROCODONE-ACETAMINOPHEN 5-325 MG PO TABS: 1.0000 | ORAL_TABLET | ORAL | Status: DC | PRN

## 2013-08-04 MED ORDER — ONDANSETRON 4 MG PO TBDP
4.0000 mg | ORAL_TABLET | Freq: Three times a day (TID) | ORAL | Status: DC | PRN
  Administered 2013-08-04 (×2): 4 mg via ORAL
  Filled 2013-08-04 (×2): qty 1

## 2013-08-04 MED ORDER — PROCHLORPERAZINE EDISYLATE 5 MG/ML IJ SOLN
10.0000 mg | Freq: Four times a day (QID) | INTRAMUSCULAR | Status: DC | PRN
  Administered 2013-08-04 – 2013-08-06 (×4): 10 mg via INTRAVENOUS
  Filled 2013-08-04 (×4): qty 2

## 2013-08-04 MED ORDER — HEPARIN SODIUM (PORCINE) 5000 UNIT/ML IJ SOLN
5000.0000 [IU] | Freq: Three times a day (TID) | INTRAMUSCULAR | Status: DC
  Administered 2013-08-04 – 2013-08-05 (×4): 5000 [IU] via SUBCUTANEOUS
  Filled 2013-08-04 (×8): qty 1

## 2013-08-04 NOTE — ED Provider Notes (Signed)
CSN: [id number]     Arrival date & time 08/04/13  0341 History   First MD Initiated Contact with Patient 08/04/13 [phone]     Chief Complaint  Patient presents with  . Emesis     (Consider location/radiation/quality/duration/timing/severity/associated sxs/prior Treatment) HPI Patient is a 68 yo man with multiple chronic medical problems including CAD, HTN, HLD, PAD - s/p endovascular AAA repair in June 2015.  He comes in today with several days of nausea and vomiting with inability to keep down clear liquids or any other po intake. The patient has had persistent nausea since Jun 05, 2013. However, he has episodes of "violent vomiting" per his wife, about once every week or two. He was seen at the [facility] ED 2 days ago for same.   He has diffuse midline abdominal pain for several months and, predating AAA repair. He is s/p cholecystectomy earlier in the year. He has seen Dr. Maurene Capes of GI but has not had an EGD.   Wife says his weight has dropped from 212 lbs to 169 lbs in the past 60 days. The patient describes his abdominal pain as aching and cramping. It comes on in waves and is associated with vomiting. Patient has not had diarrhea. Last BM was 2 days ago.    Past Medical History  Diagnosis Date  . Coronary atherosclerosis of native coronary artery     a. 10/2008 inf STEMI/PCI: LM 50d (IVUS-borderline lesion->med rx), LAD min irregs, LCX 18m, 70d, OM nl, RCA 121m (3.5x28 Vision BMS);  b. 11/2008 Lexiscan MV: EF 65%, no isch/scar.  . Essential hypertension, benign   . Pure hypercholesterolemia   . AAA (abdominal aortic aneurysm)     a. 05/2013 CT: 5.8 cm AAA.  Marland Kitchen Diverticulitis     a. 05/2013 CT: descending/sigmoid jxn w/o abscess.  . Osteoarthritis   . Tobacco abuse     a. ongoing - 1ppd for better part of 50 yrs.  . Normocytic anemia   . Cellulitis 06/10/2013    Right antecubital fossa at site of IV  03/12/13  . PONV (postoperative nausea and vomiting)    Past Surgical History  Procedure  Laterality Date  . Cardiac stents  2010  . Cholecystectomy N/A 06/19/2013    Procedure: LAPAROSCOPIC CHOLECYSTECTOMY;  Surgeon: Harl Bowie, MD;  Location: [place];  Service: General;  Laterality: N/A;  . Abdominal aortic endovascular stent graft N/A 07/02/2013    Procedure: ABDOMINAL AORTIC ENDOVASCULAR STENT GRAFT;  Surgeon: Serafina Mitchell, MD;  Location: [facility] OR;  Service: Vascular;  Laterality: N/A;   Family History  Problem Relation Age of Onset  . Heart attack Brother   . Cancer Father   . Cancer Mother    History  Substance Use Topics  . Smoking status: Former Smoker -- 1.00 packs/day for 50 years    Types: Cigarettes    Quit date: 06/05/2013  . Smokeless tobacco: Never Used  . Alcohol Use: No    Review of Systems  Ten point review of symptoms performed and is negative with the exception of symptoms noted above.   Allergies  Cephalexin and Phenergan  Home Medications   Prior to Admission medications   Medication Sig Start Date End Date Taking? Authorizing Provider  ferrous fumarate (HEMOCYTE - 106 MG FE) 325 (106 FE) MG TABS tablet Take 1 tablet by mouth daily.   Yes Historical Provider, MD  metoprolol tartrate (LOPRESSOR) 25 MG tablet Take 0.5 tablets (12.5 mg total) by mouth 2 (two) times  daily. 08/03/13  Yes Sherren Mocha, MD  nitroGLYCERIN (NITROSTAT) 0.4 MG SL tablet Place 0.4 mg under the tongue every 5 (five) minutes as needed for chest pain.   Yes Historical Provider, MD  omeprazole (PRILOSEC) 20 MG capsule Take 1 capsule (20 mg total) by mouth daily. 08/03/13  Yes Courtney A Forcucci, PA-C  ondansetron (ZOFRAN ODT) 4 MG disintegrating tablet Take 1 tablet (4 mg total) by mouth every 8 (eight) hours as needed for nausea or vomiting. 08/03/13  Yes Courtney A Forcucci, PA-C  Probiotic Product (RESTORA) CAPS Take 1 capsule by mouth daily. 07/14/13  Yes Amy S Esterwood, PA-C  promethazine (PHENERGAN) 25 MG suppository Place 1 suppository (25 mg total) rectally every 6  (six) hours as needed for nausea or vomiting. 08/03/13  Yes Courtney A Forcucci, PA-C  rosuvastatin (CRESTOR) 10 MG tablet Take 1 tablet (10 mg total) by mouth daily. 07/27/13  Yes Shaune Pascal Bensimhon, MD  sucralfate (CARAFATE) 1 GM/10ML suspension Take 1 g by mouth 2 (two) times daily.   Yes Historical Provider, MD   BP 134/66  Pulse 64  Temp(Src) 97.6 F (36.4 C) (Oral)  Resp 20  Ht 5\' 10"  (1.778 m)  Wt 170 lb (77.111 kg)  BMI 24.39 kg/m2  SpO2 100% Physical Exam Gen: well developed and well nourished appearing, appears mildly uncomfortable Head: NCAT Eyes: PERL, EOMI Nose: no epistaixis or rhinorrhea Mouth/throat: mucosa is mildly dehydrated appearing and pink Neck: supple, no stridor Lungs: CTA B, no wheezing, rhonchi or rales CV: RRR, no murmur, extremities appear well perfused.  Abd: soft, notender, nondistended Back: no ttp, no cva ttp Skin: warm and dry Ext: normal to inspection, no dependent edema Neuro: CN ii-xii grossly intact, no focal deficits Psyche; mildly distressed affect,  calm and cooperative.   ED Course  Procedures (including critical care time) Labs Review\  EKG: nsr, no acute ischemic changes, normal intervals, normal axis, normal qrs complex  Imaging Review  Dg Abd Acute W/chest  08/02/2013   CLINICAL DATA:  Abdominal pain, nausea and vomiting.  EXAM: ACUTE ABDOMEN SERIES (ABDOMEN 2 VIEW & CHEST 1 VIEW)  COMPARISON:  Chest radiograph performed 07/02/2013, and CTA of the abdomen and pelvis performed 07/07/2013  FINDINGS: The lungs are well-aerated and clear. There is no evidence of focal opacification, pleural effusion or pneumothorax. The cardiomediastinal silhouette is within normal limits.  The visualized bowel gas pattern is unremarkable. Scattered stool and air are seen within the colon; there is no evidence of small bowel dilatation to suggest obstruction. No free intra-abdominal air is identified on the provided upright view.  No acute osseous  abnormalities are seen; the sacroiliac joints are unremarkable in appearance. An aortoiliac stent graft is noted. Clips are noted within the right upper quadrant, reflecting prior cholecystectomy.  IMPRESSION: 1. Unremarkable bowel gas pattern; no free intra-abdominal air seen. Small amount of stool in the colon. 2. No acute cardiopulmonary process seen.   Electronically Signed   By: Garald Balding M.D.   On: 08/02/2013 23:06    Results for orders placed during the hospital encounter of 08/04/13 (from the past 24 hour(s))  CBC WITH DIFFERENTIAL     Status: Abnormal   Collection Time    08/04/13  4:55 AM      Result Value Ref Range   WBC 3.2 (*) 4.0 - 10.5 K/uL   RBC 2.98 (*) 4.22 - 5.81 MIL/uL   Hemoglobin 8.4 (*) 13.0 - 17.0 g/dL   HCT 26.9 (*) 39.0 -  52.0 %   MCV 90.3  78.0 - 100.0 fL   MCH 28.2  26.0 - 34.0 pg   MCHC 31.2  30.0 - 36.0 g/dL   RDW 19.4 (*) 11.5 - 15.5 %   Platelets 133 (*) 150 - 400 K/uL   Neutrophils Relative % 76  43 - 77 %   Neutro Abs 2.4  1.7 - 7.7 K/uL   Lymphocytes Relative 15  12 - 46 %   Lymphs Abs 0.5 (*) 0.7 - 4.0 K/uL   Monocytes Relative 8  3 - 12 %   Monocytes Absolute 0.3  0.1 - 1.0 K/uL   Eosinophils Relative 0  0 - 5 %   Eosinophils Absolute 0.0  0.0 - 0.7 K/uL   Basophils Relative 1  0 - 1 %   Basophils Absolute 0.0  0.0 - 0.1 K/uL  COMPREHENSIVE METABOLIC PANEL     Status: Abnormal   Collection Time    08/04/13  4:55 AM      Result Value Ref Range   Sodium 141  137 - 147 mEq/L   Potassium 3.6 (*) 3.7 - 5.3 mEq/L   Chloride 101  96 - 112 mEq/L   CO2 21  19 - 32 mEq/L   Glucose, Bld 142 (*) 70 - 99 mg/dL   BUN 23  6 - 23 mg/dL   Creatinine, Ser 1.18  0.50 - 1.35 mg/dL   Calcium 9.3  8.4 - 10.5 mg/dL   Total Protein 8.2  6.0 - 8.3 g/dL   Albumin 4.0  3.5 - 5.2 g/dL   AST 20  0 - 37 U/L   ALT 15  0 - 53 U/L   Alkaline Phosphatase 99  39 - 117 U/L   Total Bilirubin 0.7  0.3 - 1.2 mg/dL   GFR calc non Af Amer 63 (*) >90 mL/min   GFR calc Af  Amer 72 (*) >90 mL/min   Anion gap 19 (*) 5 - 15  MAGNESIUM     Status: None   Collection Time    08/04/13  6:20 AM      Result Value Ref Range   Magnesium 2.1  1.5 - 2.5 mg/dL  I-STAT CG4 LACTIC ACID, ED     Status: None   Collection Time    08/04/13  6:28 AM      Result Value Ref Range   Lactic Acid, Venous 1.38  0.5 - 2.2 mmol/L    MDM  Patient with intractable nausea and vomiting. Fortunately, he does not appear to have any acute kidney injury or significant block to light abnormalities. He is, however noted to be pancytopenic although, this is not a new finding. He says his abdominal pain has been present for several months and pre-dates endovascular repair of AAA. He was most recently imaged with a CT angiogram of the abdomen and pelvis less than one month ago. I do not feel that reimaging is indicated at this time. Normal lactic acid level reassures against mesenteric ischemia.  I discussed the case with Dr. Hal Hope who has accepted the patient to the Med Surg unit.     Elyn Peers, MD 08/04/13 [phone]

## 2013-08-04 NOTE — ED Notes (Addendum)
Pt. reports persistent nausea/vomitting unrelieved by Phenergan suppositories / Zofran ODT and Omeprazole prescribed 2 days ago .

## 2013-08-04 NOTE — Consult Note (Signed)
Referring Provider: Dr. Verlon Au Primary Care Physician:  Shirline Frees, MD Primary Gastroenterologist:  Althia Forts  Reason for Consultation:  N/V/abdominal pain  HPI: [name] is a 68 y.o. male with recurrent N/V/abdominal pain who had hematemesis (large amount of red blood with vomitus) at home last night per his wife. Recent diverticulitis and cholecystitis as well as aorto bi-iliac stent graft repair of a AAA in June. His diverticulitis was treated with Cipro as an outpt and he was also given Restora probiotic. Tolerating food until onset of severe N/V about 10 days ago that worsened. Pain has been in his epigastric region with radiation into his substernal area. Has chronic GERD every other day and takes Tums daily. Dark stools recently but also recently started on iron. Denies hematochezia. Denies chest pain or shortness of breath. He has never had a colonoscopy. No known history of ulcers.     Past Medical History  Diagnosis Date  . Coronary atherosclerosis of native coronary artery     a. 10/2008 inf STEMI/PCI: LM 50d (IVUS-borderline lesion->med rx), LAD min irregs, LCX 45m, 70d, OM nl, RCA 156m (3.5x28 Vision BMS);  b. 11/2008 Lexiscan MV: EF 65%, no isch/scar.  . Essential hypertension, benign   . Pure hypercholesterolemia   . AAA (abdominal aortic aneurysm)     a. 05/2013 CT: 5.8 cm AAA.  Marland Kitchen Diverticulitis     a. 05/2013 CT: descending/sigmoid jxn w/o abscess.  . Osteoarthritis   . Tobacco abuse     a. ongoing - 1ppd for better part of 50 yrs.  . Normocytic anemia   . Cellulitis 06/10/2013    Right antecubital fossa at site of IV  03/12/13  . PONV (postoperative nausea and vomiting)     Past Surgical History  Procedure Laterality Date  . Cardiac stents  2010  . Cholecystectomy N/A 06/19/2013    Procedure: LAPAROSCOPIC CHOLECYSTECTOMY;  Surgeon: Harl Bowie, MD;  Location: [facility];  Service: General;  Laterality: N/A;  . Abdominal aortic endovascular stent graft  N/A 07/02/2013    Procedure: ABDOMINAL AORTIC ENDOVASCULAR STENT GRAFT;  Surgeon: Serafina Mitchell, MD;  Location: [facility];  Service: Vascular;  Laterality: N/A;    Prior to Admission medications   Medication Sig Start Date End Date Taking? Authorizing Provider  ferrous fumarate (HEMOCYTE - 106 MG FE) 325 (106 FE) MG TABS tablet Take 1 tablet by mouth daily.   Yes Historical Provider, MD  metoprolol tartrate (LOPRESSOR) 25 MG tablet Take 0.5 tablets (12.5 mg total) by mouth 2 (two) times daily. 08/03/13  Yes Sherren Mocha, MD  nitroGLYCERIN (NITROSTAT) 0.4 MG SL tablet Place 0.4 mg under the tongue every 5 (five) minutes as needed for chest pain.   Yes Historical Provider, MD  omeprazole (PRILOSEC) 20 MG capsule Take 1 capsule (20 mg total) by mouth daily. 08/03/13  Yes Courtney A Forcucci, PA-C  ondansetron (ZOFRAN ODT) 4 MG disintegrating tablet Take 1 tablet (4 mg total) by mouth every 8 (eight) hours as needed for nausea or vomiting. 08/03/13  Yes Courtney A Forcucci, PA-C  Probiotic Product (RESTORA) CAPS Take 1 capsule by mouth daily. 07/14/13  Yes Amy S Esterwood, PA-C  promethazine (PHENERGAN) 25 MG suppository Place 1 suppository (25 mg total) rectally every 6 (six) hours as needed for nausea or vomiting. 08/03/13  Yes Courtney A Forcucci, PA-C  rosuvastatin (CRESTOR) 10 MG tablet Take 1 tablet (10 mg total) by mouth daily. 07/27/13  Yes Jolaine Artist, MD  sucralfate ([place]) 1  GM/10ML suspension Take 1 g by mouth 2 (two) times daily.   Yes Historical Provider, MD    Scheduled Meds: . famotidine (PEPCID) IV  20 mg Intravenous Once  . heparin  5,000 Units Subcutaneous 3 times per day  . metoprolol tartrate  12.5 mg Oral BID  . ondansetron (ZOFRAN) IV  4 mg Intravenous Once  . sodium chloride  3 mL Intravenous Q12H  . sucralfate  1 g Oral BID   Continuous Infusions: . sodium chloride 50 mL/hr at 08/04/13 1044   PRN Meds:.HYDROcodone-acetaminophen, morphine injection, ondansetron,  prochlorperazine  Allergies as of 08/04/2013 - Review Complete 08/04/2013  Allergen Reaction Noted  . Cephalexin Other (See Comments) 07/01/2013  . Phenergan [promethazine hcl] Other (See Comments) 08/04/2013    Family History  Problem Relation Age of Onset  . Heart attack Brother     46s  . Cancer Father     Lung  . Cancer Mother     Brain  . Cancer Brother     Kidney    History   Social History  . Marital Status: Married    Spouse Name: N/A    Number of Children: N/A  . Years of Education: N/A   Occupational History  . Not on file.   Social History Main Topics  . Smoking status: Former Smoker -- 1.00 packs/day for 50 years    Types: Cigarettes    Quit date: 06/05/2013  . Smokeless tobacco: Never Used  . Alcohol Use: No  . Drug Use: No  . Sexual Activity: Not on file   Other Topics Concern  . Not on file   Social History Narrative   Lives in [place] with wife.  Retired Furniture conservator/restorer.  Works out in the yard often - limited by claudication.  Does not routinely exercise.    Review of Systems: All negative from GI standpoint except as stated above in HPI.  Physical Exam: Vital signs: Filed Vitals:   08/04/13 0940  BP: 130/66  Pulse: 54  Temp: 98.1 F (36.7 C)  Resp: 16     General:   Alert,  Well-developed, well-nourished, pleasant and cooperative in NAD HEENT: anicteric, no oral lesions Neck: supple, nontender Lungs:  Clear throughout to auscultation.   No wheezes, crackles, or rhonchi. No acute distress. Heart:  Regular rate and rhythm; no murmurs, clicks, rubs,  or gallops. Abdomen: epigastric tenderness with minimal guarding, soft, nondistended, +BS  Rectal:  Deferred Ext: no edema  GI:  Lab Results:  Recent Labs  08/02/13 1955 08/04/13 0455  WBC 4.4 3.2*  HGB 8.1* 8.4*  HCT 25.3* 26.9*  PLT 118* 133*   BMET  Recent Labs  08/02/13 1955 08/04/13 0455  NA 137 141  K 3.9 3.6*  CL 100 101  CO2 23 21  GLUCOSE 110* 142*  BUN 18 23   CREATININE 1.21 1.18  CALCIUM 9.4 9.3   LFT  Recent Labs  08/04/13 0455  PROT 8.2  ALBUMIN 4.0  AST 20  ALT 15  ALKPHOS 99  BILITOT 0.7   PT/INR No results found for this basename: LABPROT, INR,  in the last 72 hours   Studies/Results: Dg Abd Acute W/chest  08/02/2013   CLINICAL DATA:  Abdominal pain, nausea and vomiting.  EXAM: ACUTE ABDOMEN SERIES (ABDOMEN 2 VIEW & CHEST 1 VIEW)  COMPARISON:  Chest radiograph performed 07/02/2013, and CTA of the abdomen and pelvis performed 07/07/2013  FINDINGS: The lungs are well-aerated and clear. There is no evidence of focal  opacification, pleural effusion or pneumothorax. The cardiomediastinal silhouette is within normal limits.  The visualized bowel gas pattern is unremarkable. Scattered stool and air are seen within the colon; there is no evidence of small bowel dilatation to suggest obstruction. No free intra-abdominal air is identified on the provided upright view.  No acute osseous abnormalities are seen; the sacroiliac joints are unremarkable in appearance. An aortoiliac stent graft is noted. Clips are noted within the right upper quadrant, reflecting prior cholecystectomy.  IMPRESSION: 1. Unremarkable bowel gas pattern; no free intra-abdominal air seen. Small amount of stool in the colon. 2. No acute cardiopulmonary process seen.   Electronically Signed   By: Garald Balding M.D.   On: 08/02/2013 23:06    Impression/Plan: 68 yo with N/V/abdominal pain in need of an EGD to look for peptic ulcer disease. LFTs normal making a retained gallstone less likely. Will need a colonoscopy as an outpt but doubt his current pain is due to a colonic source/the recent diverticulitis. NPO p MN. EGD tomorrow. IV PPI Q 12 hours. Supportive care.    LOS: 0 days   Elfrida C.  08/04/2013, 12:38 PM

## 2013-08-04 NOTE — ED Notes (Signed)
Admit Doctor at bedside.  

## 2013-08-04 NOTE — ED Notes (Signed)
Pt has been having nausea/vomiting that is unrelieved with phenergan, and zofran. Pt has been having nausea since last Monday, was seen at [facility] long and treated for dehydration. Per wife pt has been sick since May of this year and they can't find the cause. Pt reports sharp abd pain. Per wife pt has been dx with aneurysm. Pt has been throwing up bright red blood.

## 2013-08-04 NOTE — Progress Notes (Addendum)
[facility] History and Physical  [name] SPQ:[id number] DOB: [birth year] DOA: 08/04/2013  Referring physician: none PCP: Darrell Frees, MD  Specialists: none  Chief Complaint: N/V  HPI: [name] is a 68 y.o. male recent AAA 5.8 cm repair  stent graft 07/14/13 [Dr. Trula [name], recent Lap chole 06/19/13 [Dr. Ninfa [name], prior known Diverticulitis, documented pancytopenia + splenomegally, prior smoker, h/o CAD s/[p Inf MI + RCA stent 10/2008 presented to [facility] ED 08/04/13 with recurrent nausea vomiting. Patient has had this episode of acute nausea vomiting since 07/24/13 but states that this predates his prior surgery and actually started in May. His wife also goes on to state that he lost about 10 pounds last year without intention but that subsequent to his multiple surgeries in the recent past has lost even more and is now down to 170 pounds from 201. There is no aggravating or relieving factor with the nausea vomiting. He awoke with it 7/12/a.m. and was only able to tolerate pancakes on 7/10.  He has not had any food since then. He has some pain in the center of his stomach. His stool has not changed in consistency or caliber but he has not passed any recently. He denies any specific chest pain blurred vision double vision or weakness  Has no headache he has no cough has no cold Has had some fever and night sweats episodes but this resolved after being treated for urinary tract infection in the past He has had one or 2 episodes of dark stool but was recently started on iron. He is nontender any specific blood in his vomit until this morning which is what brought him to come over to the hospital. He overtly does not complain of any chest pain. He states that his pain is currently 4/10 and it is periumbilical. He without medications for pain he states his pain is 10/10.  view of Systems:  No chest pain Is no blurred vision   Past Medical History  Diagnosis Date  . Coronary  atherosclerosis of native coronary artery     a. 10/2008 inf STEMI/PCI: LM 50d (IVUS-borderline lesion->med rx), LAD min irregs, LCX 3m, 70d, OM nl, RCA 183m (3.5x28 Vision BMS);  b. 11/2008 Lexiscan MV: EF 65%, no isch/scar.  . Essential hypertension, benign   . Pure hypercholesterolemia   . AAA (abdominal aortic aneurysm)     a. 05/2013 CT: 5.8 cm AAA.  Marland Kitchen Diverticulitis     a. 05/2013 CT: descending/sigmoid jxn w/o abscess.  . Osteoarthritis   . Tobacco abuse     a. ongoing - 1ppd for better part of 50 yrs.  . Normocytic anemia   . Cellulitis 06/10/2013    Right antecubital fossa at site of IV  03/12/13  . PONV (postoperative nausea and vomiting)    Past Surgical History  Procedure Laterality Date  . Cardiac stents  2010  . Cholecystectomy N/A 06/19/2013    Procedure: LAPAROSCOPIC CHOLECYSTECTOMY;  Surgeon: Darrell Bowie, MD;  Location: [facility];  Service: General;  Laterality: N/A;  . Abdominal aortic endovascular stent graft N/A 07/02/2013    Procedure: ABDOMINAL AORTIC ENDOVASCULAR STENT GRAFT;  Surgeon: Darrell Mitchell, MD;  Location: [facility] OR;  Service: Vascular;  Laterality: N/A;   Social History:  History   Social History Narrative   Lives in [place] with wife.  Retired Furniture conservator/restorer.  Works out in the yard often - limited by claudication.  Does not routinely exercise.    Allergies  Allergen Reactions  .  Cephalexin Other (See Comments)    Nausea and stomach cramps  . Phenergan [Promethazine Hcl] Other (See Comments)    "restless legs"    Family History  Problem Relation Age of Onset  . Heart attack Brother     66s  . Cancer Father     Lung  . Cancer Mother     Brain  . Cancer Brother     Kidney     Prior to Admission medications   Medication Sig Start Date End Date Taking? Authorizing Provider  ferrous fumarate (HEMOCYTE - 106 MG FE) 325 (106 FE) MG TABS tablet Take 1 tablet by mouth daily.   Yes Historical Provider, MD  metoprolol tartrate (LOPRESSOR) 25 MG tablet  Take 0.5 tablets (12.5 mg total) by mouth 2 (two) times daily. 08/03/13  Yes Darrell Mocha, MD  nitroGLYCERIN (NITROSTAT) 0.4 MG SL tablet Place 0.4 mg under the tongue every 5 (five) minutes as needed for chest pain.   Yes Historical Provider, MD  omeprazole (PRILOSEC) 20 MG capsule Take 1 capsule (20 mg total) by mouth daily. 08/03/13  Yes Darrell A Forcucci, PA-C  ondansetron (ZOFRAN ODT) 4 MG disintegrating tablet Take 1 tablet (4 mg total) by mouth every 8 (eight) hours as needed for nausea or vomiting. 08/03/13  Yes Darrell A Forcucci, PA-C  Probiotic Product (RESTORA) CAPS Take 1 capsule by mouth daily. 07/14/13  Yes Darrell S Esterwood, PA-C  promethazine (PHENERGAN) 25 MG suppository Place 1 suppository (25 mg total) rectally every 6 (six) hours as needed for nausea or vomiting. 08/03/13  Yes Darrell A Forcucci, PA-C  rosuvastatin (CRESTOR) 10 MG tablet Take 1 tablet (10 mg total) by mouth daily. 07/27/13  Yes Darrell Pascal Bensimhon, MD  sucralfate (CARAFATE) 1 GM/10ML suspension Take 1 g by mouth 2 (two) times daily.   Yes Historical Provider, MD   Physical Exam: Filed Vitals:   08/04/13 0345  BP: 134/66  Pulse: 64  Temp: 97.6 F (36.4 C)  TempSrc: Oral  Resp: 20  Height: 5\' 10"  (1.778 m)  Weight: 77.111 kg (170 lb)  SpO2: 100%     General:  An alert pleasant oriented no apparent distress.   Eyes: EOMI NCAT mild pallor, no icterus  ENT: Soft supple no palpable lymphadenopathy, throat clear partial dentures upper  Neck: Soft supple  Cardiovascular: S1-S2 murmur right and left upper sternal edges  Respiratory: Clinically clear no added sound  Abdomen: Soft mildly tender in the epigastric area  Skin: No lower extremity edema  Musculoskeletal: Range of motion intact moving all 4 limbs equally  Psychiatric: Euthymic  Neurologic: Grossly intact moving all 4 limbs equally power 5/5, reflexes 2/3,  Labs on Admission:  Basic Metabolic Panel:  Recent Labs Lab 08/02/13 1955  08/04/13 0455  NA 137 141  K 3.9 3.6*  CL 100 101  CO2 23 21  GLUCOSE 110* 142*  BUN 18 23  CREATININE 1.21 1.18  CALCIUM 9.4 9.3   Liver Function Tests:  Recent Labs Lab 08/02/13 1955 08/04/13 0455  AST 15 20  ALT 11 15  ALKPHOS 93 99  BILITOT 0.6 0.7  PROT 7.6 8.2  ALBUMIN 3.7 4.0    Recent Labs Lab 08/02/13 2019  LIPASE 32   No results found for this basename: AMMONIA,  in the last 168 hours CBC:  Recent Labs Lab 08/02/13 1955 08/04/13 0455  WBC 4.4 3.2*  NEUTROABS 3.0 2.4  HGB 8.1* 8.4*  HCT 25.3* 26.9*  MCV 89.1 90.3  PLT  118* 133*   Cardiac Enzymes: No results found for this basename: CKTOTAL, CKMB, CKMBINDEX, TROPONINI,  in the last 168 hours  BNP (last 3 results) No results found for this basename: PROBNP,  in the last 8760 hours CBG: No results found for this basename: GLUCAP,  in the last 168 hours  Radiological Exams on Admission: Dg Abd Acute W/chest  08/02/2013   CLINICAL DATA:  Abdominal pain, nausea and vomiting.  EXAM: ACUTE ABDOMEN SERIES (ABDOMEN 2 VIEW & CHEST 1 VIEW)  COMPARISON:  Chest radiograph performed 07/02/2013, and CTA of the abdomen and pelvis performed 07/07/2013  FINDINGS: The lungs are well-aerated and clear. There is no evidence of focal opacification, pleural effusion or pneumothorax. The cardiomediastinal silhouette is within normal limits.  The visualized bowel gas pattern is unremarkable. Scattered stool and air are seen within the colon; there is no evidence of small bowel dilatation to suggest obstruction. No free intra-abdominal air is identified on the provided upright view.  No acute osseous abnormalities are seen; the sacroiliac joints are unremarkable in appearance. An aortoiliac stent graft is noted. Clips are noted within the right upper quadrant, reflecting prior cholecystectomy.  IMPRESSION: 1. Unremarkable bowel gas pattern; no free intra-abdominal air seen. Small amount of stool in the colon. 2. No acute  cardiopulmonary process seen.   Electronically Signed   By: Garald Balding M.D.   On: 08/02/2013 23:06    EKG: Independently reviewed. Sinus rhythm, PR interval 0.12 QRS axis 90, no acute ST-T wave inversions.    Assessment/Plan Principal Problem:   Nausea & vomiting- unclear etiology.  His symptoms are not concerning this time for AAA leak however this must be kept in the back of her meds. I will consult gastroenterology for further workup . It appears that he was supposed to have an endoscopy at some point to determine whether his nausea vomiting was a functional issue. He may start with barium swallow /esophagram and if needed a repeat scan of abdomen/pelvis if no source is found. There is no definite food/nausea relationship and this just below came with pain. I suppose this could also be related to peptic ulcer disease . We will keep him on IV fluids for now and IV antiemetics and reassess . We will continue his Carafate as well as a Zofran ODT for now . Place on scheduled Phenergan.  His wife has concerns about giving contrast for CT scan of the abdomen pelvis and have also suggested that although this may be needed we can also consult gastroenterology first. She has been seen by Dr. Nichola Sizer office and we will consult GI. Active Problems:   HYPERTENSION, UNSPECIFIED-continue metoprolol 12.5 twice a day moderately controlled   MYOCARDIAL INFARCTION, HX OF-not currently on either Plavix or aspirin-unclear as to why not.  Will need this once we figure out the etiology   Coronary atherosclerosis of native coronary artery-status post stent 10/2008. Continue metoprolol for now as above   BRADYCARDIA-stable-monitor   AAA (abdominal aortic aneurysm) s/p repa 06/2008   Pancytopenia -unclear etiology. A relatively low however he does not have a gross drop in either blood count 4 CBC. Marland Kitchen He was supposed to have a lymph node biopsied at the time of recent AAA surgery but I am not sure this has been done.  This will need to be monitored as an outpatient and he will need definitive workup    Time spent: 53  wife at [place], [employer] [facility] Pager [phone]  If  7PM-7AM, please contact night-coverage [email or web address] Password [facility] 08/04/2013, 7:14 AM

## 2013-08-05 ENCOUNTER — Encounter (HOSPITAL_COMMUNITY)
Admission: EM | Disposition: A | Discharge status: Home / Self Care | Payer: Self-pay | Source: Home / Self Care | Attending: Family Medicine

## 2013-08-05 ENCOUNTER — Encounter (HOSPITAL_COMMUNITY): Payer: Self-pay | Admitting: *Deleted

## 2013-08-05 HISTORY — PX: ESOPHAGOGASTRODUODENOSCOPY: SHX5428

## 2013-08-05 LAB — COMPREHENSIVE METABOLIC PANEL
ALT: 32 U/L (ref 0–53)
ANION GAP: 14 (ref 5–15)
AST: 41 U/L — ABNORMAL HIGH (ref 0–37)
Albumin: 3.5 g/dL (ref 3.5–5.2)
Alkaline Phosphatase: 90 U/L (ref 39–117)
BILIRUBIN TOTAL: 0.6 mg/dL (ref 0.3–1.2)
BUN: 23 mg/dL (ref 6–23)
CALCIUM: 8.5 mg/dL (ref 8.4–10.5)
CHLORIDE: 106 meq/L (ref 96–112)
CO2: 23 meq/L (ref 19–32)
Creatinine, Ser: 1.12 mg/dL (ref 0.50–1.35)
GFR, EST AFRICAN AMERICAN: 77 mL/min — AB (ref >90)
GFR, EST NON AFRICAN AMERICAN: 67 mL/min — AB (ref >90)
Glucose, Bld: 100 mg/dL — ABNORMAL HIGH (ref 70–99)
Potassium: 3.5 mEq/L — ABNORMAL LOW (ref 3.7–5.3)
Sodium: 143 mEq/L (ref 137–147)
Total Protein: 7 g/dL (ref 6.0–8.3)

## 2013-08-05 LAB — CBC
HCT: 25 % — ABNORMAL LOW (ref 39.0–52.0)
HEMOGLOBIN: 8 g/dL — AB (ref 13.0–17.0)
MCH: 29.3 pg (ref 26.0–34.0)
MCHC: 32 g/dL (ref 30.0–36.0)
MCV: 91.6 fL (ref 78.0–100.0)
Platelets: 126 10*3/uL — ABNORMAL LOW (ref 150–400)
RBC: 2.73 MIL/uL — AB (ref 4.22–5.81)
RDW: 19.6 % — ABNORMAL HIGH (ref 11.5–15.5)
WBC: 3.6 10*3/uL — ABNORMAL LOW (ref 4.0–10.5)

## 2013-08-05 LAB — PROTIME-INR
INR: 1.31 (ref 0.00–1.49)
PROTHROMBIN TIME: 16.3 s — AB (ref 11.6–15.2)

## 2013-08-05 SURGERY — EGD (ESOPHAGOGASTRODUODENOSCOPY)
Anesthesia: Moderate Sedation

## 2013-08-05 MED ORDER — PANTOPRAZOLE SODIUM 40 MG IV SOLR
40.0000 mg | Freq: Two times a day (BID) | INTRAVENOUS | Status: DC
  Administered 2013-08-05 (×2): 40 mg via INTRAVENOUS
  Filled 2013-08-05 (×4): qty 40

## 2013-08-05 MED ORDER — FENTANYL CITRATE 0.05 MG/ML IJ SOLN
INTRAMUSCULAR | Status: AC
  Filled 2013-08-05: qty 2

## 2013-08-05 MED ORDER — MIDAZOLAM HCL 10 MG/2ML IJ SOLN
INTRAMUSCULAR | Status: DC | PRN
  Administered 2013-08-05 (×2): 2 mg via INTRAVENOUS
  Administered 2013-08-05: 1 mg via INTRAVENOUS

## 2013-08-05 MED ORDER — ONDANSETRON HCL 4 MG/2ML IJ SOLN
4.0000 mg | Freq: Once | INTRAMUSCULAR | Status: AC
  Administered 2013-08-05: 4 mg via INTRAVENOUS

## 2013-08-05 MED ORDER — ONDANSETRON HCL 4 MG/2ML IJ SOLN
INTRAMUSCULAR | Status: AC
  Filled 2013-08-05: qty 2

## 2013-08-05 MED ORDER — SUCRALFATE 1 GM/10ML PO SUSP
1.0000 g | Freq: Three times a day (TID) | ORAL | Status: DC
  Administered 2013-08-05 – 2013-08-06 (×3): 1 g via ORAL
  Filled 2013-08-05 (×6): qty 10

## 2013-08-05 MED ORDER — DIPHENHYDRAMINE HCL 50 MG/ML IJ SOLN
INTRAMUSCULAR | Status: AC
  Filled 2013-08-05: qty 1

## 2013-08-05 MED ORDER — SUCRALFATE 1 GM/10ML PO SUSP
1.0000 g | Freq: Three times a day (TID) | ORAL | Status: DC
Start: 2013-08-05 — End: 2013-08-05
  Filled 2013-08-05 (×2): qty 10

## 2013-08-05 MED ORDER — BUTAMBEN-TETRACAINE-BENZOCAINE 2-2-14 % EX AERO
INHALATION_SPRAY | CUTANEOUS | Status: DC | PRN
  Administered 2013-08-05: 2 via TOPICAL

## 2013-08-05 MED ORDER — FENTANYL CITRATE 0.05 MG/ML IJ SOLN
INTRAMUSCULAR | Status: DC | PRN
  Administered 2013-08-05 (×2): 25 ug via INTRAVENOUS

## 2013-08-05 MED ORDER — MIDAZOLAM HCL 5 MG/ML IJ SOLN
INTRAMUSCULAR | Status: AC
  Filled 2013-08-05: qty 2

## 2013-08-05 NOTE — H&P (View-Only) (Signed)
Referring Provider: Dr. Verlon Au Primary Care Physician:  Shirline Frees, MD Primary Gastroenterologist:  Althia Forts  Reason for Consultation:  N/V/abdominal pain  HPI: [name] is a 68 y.o. male with recurrent N/V/abdominal pain who had hematemesis (large amount of red blood with vomitus) at home last night per his wife. Recent diverticulitis and cholecystitis as well as aorto bi-iliac stent graft repair of a AAA in June. His diverticulitis was treated with Cipro as an outpt and he was also given Restora probiotic. Tolerating food until onset of severe N/V about 10 days ago that worsened. Pain has been in his epigastric region with radiation into his substernal area. Has chronic GERD every other day and takes Tums daily. Dark stools recently but also recently started on iron. Denies hematochezia. Denies chest pain or shortness of breath. He has never had a colonoscopy. No known history of ulcers.     Past Medical History  Diagnosis Date  . Coronary atherosclerosis of native coronary artery     a. 10/2008 inf STEMI/PCI: LM 50d (IVUS-borderline lesion->med rx), LAD min irregs, LCX 35m, 70d, OM nl, RCA 149m (3.5x28 Vision BMS);  b. 11/2008 Lexiscan MV: EF 65%, no isch/scar.  . Essential hypertension, benign   . Pure hypercholesterolemia   . AAA (abdominal aortic aneurysm)     a. 05/2013 CT: 5.8 cm AAA.  Marland Kitchen Diverticulitis     a. 05/2013 CT: descending/sigmoid jxn w/o abscess.  . Osteoarthritis   . Tobacco abuse     a. ongoing - 1ppd for better part of 50 yrs.  . Normocytic anemia   . Cellulitis 06/10/2013    Right antecubital fossa at site of IV  03/12/13  . PONV (postoperative nausea and vomiting)     Past Surgical History  Procedure Laterality Date  . Cardiac stents  2010  . Cholecystectomy N/A 06/19/2013    Procedure: LAPAROSCOPIC CHOLECYSTECTOMY;  Surgeon: Harl Bowie, MD;  Location: [facility];  Service: General;  Laterality: N/A;  . Abdominal aortic endovascular stent graft  N/A 07/02/2013    Procedure: ABDOMINAL AORTIC ENDOVASCULAR STENT GRAFT;  Surgeon: Serafina Mitchell, MD;  Location: [facility];  Service: Vascular;  Laterality: N/A;    Prior to Admission medications   Medication Sig Start Date End Date Taking? Authorizing Provider  ferrous fumarate (HEMOCYTE - 106 MG FE) 325 (106 FE) MG TABS tablet Take 1 tablet by mouth daily.   Yes Historical Provider, MD  metoprolol tartrate (LOPRESSOR) 25 MG tablet Take 0.5 tablets (12.5 mg total) by mouth 2 (two) times daily. 08/03/13  Yes Sherren Mocha, MD  nitroGLYCERIN (NITROSTAT) 0.4 MG SL tablet Place 0.4 mg under the tongue every 5 (five) minutes as needed for chest pain.   Yes Historical Provider, MD  omeprazole (PRILOSEC) 20 MG capsule Take 1 capsule (20 mg total) by mouth daily. 08/03/13  Yes Courtney A Forcucci, PA-C  ondansetron (ZOFRAN ODT) 4 MG disintegrating tablet Take 1 tablet (4 mg total) by mouth every 8 (eight) hours as needed for nausea or vomiting. 08/03/13  Yes Courtney A Forcucci, PA-C  Probiotic Product (RESTORA) CAPS Take 1 capsule by mouth daily. 07/14/13  Yes Amy S Esterwood, PA-C  promethazine (PHENERGAN) 25 MG suppository Place 1 suppository (25 mg total) rectally every 6 (six) hours as needed for nausea or vomiting. 08/03/13  Yes Courtney A Forcucci, PA-C  rosuvastatin (CRESTOR) 10 MG tablet Take 1 tablet (10 mg total) by mouth daily. 07/27/13  Yes Jolaine Artist, MD  sucralfate ([place]) 1  GM/10ML suspension Take 1 g by mouth 2 (two) times daily.   Yes Historical Provider, MD    Scheduled Meds: . famotidine (PEPCID) IV  20 mg Intravenous Once  . heparin  5,000 Units Subcutaneous 3 times per day  . metoprolol tartrate  12.5 mg Oral BID  . ondansetron (ZOFRAN) IV  4 mg Intravenous Once  . sodium chloride  3 mL Intravenous Q12H  . sucralfate  1 g Oral BID   Continuous Infusions: . sodium chloride 50 mL/hr at 08/04/13 1044   PRN Meds:.HYDROcodone-acetaminophen, morphine injection, ondansetron,  prochlorperazine  Allergies as of 08/04/2013 - Review Complete 08/04/2013  Allergen Reaction Noted  . Cephalexin Other (See Comments) 07/01/2013  . Phenergan [promethazine hcl] Other (See Comments) 08/04/2013    Family History  Problem Relation Age of Onset  . Heart attack Brother     40s  . Cancer Father     Lung  . Cancer Mother     Brain  . Cancer Brother     Kidney    History   Social History  . Marital Status: Married    Spouse Name: N/A    Number of Children: N/A  . Years of Education: N/A   Occupational History  . Not on file.   Social History Main Topics  . Smoking status: Former Smoker -- 1.00 packs/day for 50 years    Types: Cigarettes    Quit date: 06/05/2013  . Smokeless tobacco: Never Used  . Alcohol Use: No  . Drug Use: No  . Sexual Activity: Not on file   Other Topics Concern  . Not on file   Social History Narrative   Lives [place] with wife.  Retired Furniture conservator/restorer.  Works out in the yard often - limited by claudication.  Does not routinely exercise.    Review of Systems: All negative from GI standpoint except as stated above in HPI.  Physical Exam: Vital signs: Filed Vitals:   08/04/13 0940  BP: 130/66  Pulse: 54  Temp: 98.1 F (36.7 C)  Resp: 16     General:   Alert,  Well-developed, well-nourished, pleasant and cooperative in NAD HEENT: anicteric, no oral lesions Neck: supple, nontender Lungs:  Clear throughout to auscultation.   No wheezes, crackles, or rhonchi. No acute distress. Heart:  Regular rate and rhythm; no murmurs, clicks, rubs,  or gallops. Abdomen: epigastric tenderness with minimal guarding, soft, nondistended, +BS  Rectal:  Deferred Ext: no edema  GI:  Lab Results:  Recent Labs  08/02/13 1955 08/04/13 0455  WBC 4.4 3.2*  HGB 8.1* 8.4*  HCT 25.3* 26.9*  PLT 118* 133*   BMET  Recent Labs  08/02/13 1955 08/04/13 0455  NA 137 141  K 3.9 3.6*  CL 100 101  CO2 23 21  GLUCOSE 110* 142*  BUN 18 23   CREATININE 1.21 1.18  CALCIUM 9.4 9.3   LFT  Recent Labs  08/04/13 0455  PROT 8.2  ALBUMIN 4.0  AST 20  ALT 15  ALKPHOS 99  BILITOT 0.7   PT/INR No results found for this basename: LABPROT, INR,  in the last 72 hours   Studies/Results: Dg Abd Acute W/chest  08/02/2013   CLINICAL DATA:  Abdominal pain, nausea and vomiting.  EXAM: ACUTE ABDOMEN SERIES (ABDOMEN 2 VIEW & CHEST 1 VIEW)  COMPARISON:  Chest radiograph performed 07/02/2013, and CTA of the abdomen and pelvis performed 07/07/2013  FINDINGS: The lungs are well-aerated and clear. There is no evidence of focal  opacification, pleural effusion or pneumothorax. The cardiomediastinal silhouette is within normal limits.  The visualized bowel gas pattern is unremarkable. Scattered stool and air are seen within the colon; there is no evidence of small bowel dilatation to suggest obstruction. No free intra-abdominal air is identified on the provided upright view.  No acute osseous abnormalities are seen; the sacroiliac joints are unremarkable in appearance. An aortoiliac stent graft is noted. Clips are noted within the right upper quadrant, reflecting prior cholecystectomy.  IMPRESSION: 1. Unremarkable bowel gas pattern; no free intra-abdominal air seen. Small amount of stool in the colon. 2. No acute cardiopulmonary process seen.   Electronically Signed   By: Garald Balding M.D.   On: 08/02/2013 23:06    Impression/Plan: 68 yo with N/V/abdominal pain in need of an EGD to look for peptic ulcer disease. LFTs normal making a retained gallstone less likely. Will need a colonoscopy as an outpt but doubt his current pain is due to a colonic source/the recent diverticulitis. NPO p MN. EGD tomorrow. IV PPI Q 12 hours. Supportive care.    LOS: 0 days   [place] C.  08/04/2013, 12:38 PM

## 2013-08-05 NOTE — Clinical Documentation Improvement (Signed)
According to EDG pt with gastritis, duodenitis, and small hiatal hernia. Please clarify if you agree with EDG findings or other diagnoses.  Possible Clinical Conditions? ____________________  ____________________ ____________________ _______Other Condition__________________ _______Cannot Clinically Determine   Supporting Information: Risk Factors: ABD pain.  N/V Signs & Symptoms:  Diagnostics: EDG 08/05/13 ENDOSCOPIC IMPRESSION:  Mild antral gastritis  Mild duodenitis  Small hiatal hernia   Treatment  Thank You, [name] ,RN Clinical Documentation Specialist:  [place] Information Management

## 2013-08-05 NOTE — Op Note (Signed)
[facility] [place], [address]   ENDOSCOPY PROCEDURE REPORT  PATIENT: Darrell, [name]  MR#: [id number] BIRTHDATE: 1945-11-04 , 63  yrs. old GENDER: Male  ENDOSCOPIST: Wilford Corner, MD REFERRED PO:EUMPNTIR team  PROCEDURE DATE:  08/05/2013 PROCEDURE:   EGD w/ biopsy ASA CLASS:   Class III INDICATIONS:Epigastric pain.   Nausea.   Vomiting. MEDICATIONS: Fentanyl 50 mcg IV, Versed 5 mg IV, and Cetacaine spray x 2  TOPICAL ANESTHETIC:  DESCRIPTION OF PROCEDURE:   After the risks benefits and alternatives of the procedure were thoroughly explained, informed consent was obtained.  The Pentax Gastroscope [id number]  endoscope was introduced through the mouth and advanced to the second portion of the duodenum , limited by Without limitations.   The instrument was slowly withdrawn as the mucosa was fully examined.     FINDINGS: The endoscope was inserted into the oropharynx and esophagus was intubated.  The esophagus was normal in appearance. The gastroesophageal junction was noted to be 42 cm from the incisors.  Endoscope was advanced into the stomach, which revealed mild erythema of the antrum and edema at the pylori channel without any ulcer seen.  The endoscope was advanced to the duodenal bulb where patchy erythema seen consistent with mild duodenitis. The second portion of duodenum was unremarkable.  The endoscope was withdrawn back into the stomach and retroflexion revealed a small hiatal hernia but otherwise normal proximal stomach.  COMPLICATIONS: None  ENDOSCOPIC IMPRESSION:     Mild antral gastritis Mild duodenitis Small hiatal hernia  RECOMMENDATIONS: F/U on path; Antiemetics and supportive care; Clear liquid diet and advance as tolerated   REPEAT EXAM: N/A  _______________________________ Wilford Corner, MD eSigned:  Wilford Corner, MD 08/05/2013 1:14 PM    CC:  PATIENT NAME:  Darrell, [name] MR#:  [id number]

## 2013-08-05 NOTE — Brief Op Note (Signed)
Mild antral gastritis and duodenitis. No ulcers seen. Continue supportive care. Clear liquids and advance as tolerated. If unable to tolerate advancement of diet then may need a gastric emptying scan.

## 2013-08-05 NOTE — Interval H&P Note (Signed)
History and Physical Interval Note:  08/05/2013 12:46 PM  [name]  has presented today for surgery, with the diagnosis of nausea, vomiting, abd pain  The various methods of treatment have been discussed with the patient and family. After consideration of risks, benefits and other options for treatment, the patient has consented to  Procedure(s): ESOPHAGOGASTRODUODENOSCOPY (EGD) (N/A) as a surgical intervention .  The patient's history has been reviewed, patient examined, no change in status, stable for surgery.  I have reviewed the patient's chart and labs.  Questions were answered to the patient's satisfaction.     [place] C.

## 2013-08-05 NOTE — Progress Notes (Signed)
Note: This document was prepared with digital dictation and possible smart phrase technology. Any transcriptional errors that result from this process are unintentional.   [name] LKG:[id number] DOB: 1945-07-11 DOA: 08/04/2013 PCP: Shirline Frees, MD  Brief narrative: 68 y.o. ? known AAA 5.8 cm repair stent graft 07/14/13 [Dr. Trula Slade, recent Lap chole 06/19/13 [Dr. Ninfa Linden, prior known Diverticulitis, documented pancytopenia + splenomegally, prior smoker, h/o CAD s/[p Inf MI + RCA stent 10/2008 presented to [facility] ED 08/04/13 with recurrent nausea vomiting. GI was consulted   Past medical history-As per Problem list Chart reviewed as below- reviewed  Consultants:  [facility]  Procedures: EGD 7/23 patchy erythema seen consistent with mild duodenitis. The  second portion of duodenum was unremarkable. The endoscope was  withdrawn back into the stomach and retroflexion revealed a small  hiatal hernia but otherwise normal proximal stomach.  Antibiotics:  none   Subjective  seems upset that we didn't find an "ulcer or cancer" Tolerating some PO liquids without issue Had pain last night 5 min after friends went home anxious    Objective    Interim History: none  Telemetry: none   Objective: Filed Vitals:   08/05/13 1340 08/05/13 1345 08/05/13 1350 08/05/13 1416  BP: 111/42 114/39 107/48 132/62  Pulse: 50 53 58 53  Temp:    98.6 F (37 C)  TempSrc:    Oral  Resp: 11 12 20 18   Height:      Weight:      SpO2: 92% 92% 93% 88%    Intake/Output Summary (Last 24 hours) at 08/05/13 1709 Last data filed at 08/05/13 0600  Gross per 24 hour  Intake 963.33 ml  Output    700 ml  Net 263.33 ml    Exam:  General: eomi, ncat Cardiovascular: s1 s2 no m/r/g Respiratory: clear, no added sound Abdomen: soft, NT/ND Skin no le edema Neuro intact  Data Reviewed: Basic Metabolic Panel:  Recent Labs Lab 08/02/13 1955 08/04/13 0455 08/04/13 0620 08/05/13 0405    NA 137 141  --  143  K 3.9 3.6*  --  3.5*  CL 100 101  --  106  CO2 23 21  --  23  GLUCOSE 110* 142*  --  100*  BUN 18 23  --  23  CREATININE 1.21 1.18  --  1.12  CALCIUM 9.4 9.3  --  8.5  MG  --   --  2.1  --    Liver Function Tests:  Recent Labs Lab 08/02/13 1955 08/04/13 0455 08/05/13 0405  AST 15 20 41*  ALT 11 15 32  ALKPHOS 93 99 90  BILITOT 0.6 0.7 0.6  PROT 7.6 8.2 7.0  ALBUMIN 3.7 4.0 3.5    Recent Labs Lab 08/02/13 2019  LIPASE 32   No results found for this basename: AMMONIA,  in the last 168 hours CBC:  Recent Labs Lab 08/02/13 1955 08/04/13 0455 08/05/13 0405  WBC 4.4 3.2* 3.6*  NEUTROABS 3.0 2.4  --   HGB 8.1* 8.4* 8.0*  HCT 25.3* 26.9* 25.0*  MCV 89.1 90.3 91.6  PLT 118* 133* 126*   Cardiac Enzymes: No results found for this basename: CKTOTAL, CKMB, CKMBINDEX, TROPONINI,  in the last 168 hours BNP: No components found with this basename: POCBNP,  CBG: No results found for this basename: GLUCAP,  in the last 168 hours  No results found for this or any previous visit (from the past 240 hour(s)).   Studies:  All Imaging reviewed and is as per above notation   Scheduled Meds: . heparin  5,000 Units Subcutaneous 3 times per day  . LORazepam  1 mg Oral Once  . metoprolol tartrate  12.5 mg Oral BID  . pantoprazole (PROTONIX) IV  40 mg Intravenous Q12H  . sodium chloride  3 mL Intravenous Q12H  . sucralfate  1 g Oral BID   Continuous Infusions: . sodium chloride Stopped (08/05/13 1106)     Assessment/Plan: 1. Gastritis with hiatal hernia causing abd pain-graduate diet per GI.  Follow up in am and might advance then 2. Htn-continue metoprolol 12.5 twice a day moderately controlled 3. Mild bradycardia-monitor on tele and if no pauses can continue above dose  4. H/o MIstatus post stent 10/2008. Continue metoprolol for now as above -not currently on either Plavix or aspirin-hold for now but resume once gastritis issues  resolved  5. AAA (abdominal aortic aneurysm) s/p repa 06/2008  6. Pancytopenia -unclear etiology. A relatively low however he does not have a gross drop in either blood count 4 CBC. Needs OP lymph node biopsy for confimration  Code Status: Full Family Communication:  None bedsdie Disposition Plan: inpatient   Verneita Griffes, MD  [facility] Pager [phone] 08/05/2013, 5:09 PM    LOS: 1 day

## 2013-08-06 ENCOUNTER — Encounter (HOSPITAL_COMMUNITY): Payer: Self-pay | Admitting: Gastroenterology

## 2013-08-06 ENCOUNTER — Encounter: Payer: Self-pay | Admitting: Surgery

## 2013-08-06 MED ORDER — SUCRALFATE 1 GM/10ML PO SUSP
1.0000 g | Freq: Four times a day (QID) | ORAL | Status: DC
  Administered 2013-08-06: 1 g via ORAL
  Filled 2013-08-06 (×4): qty 10

## 2013-08-06 MED ORDER — PANTOPRAZOLE SODIUM 40 MG PO TBEC
40.0000 mg | DELAYED_RELEASE_TABLET | Freq: Two times a day (BID) | ORAL | Status: DC
  Administered 2013-08-06: 40 mg via ORAL
  Filled 2013-08-06: qty 1

## 2013-08-06 MED ORDER — PANTOPRAZOLE SODIUM 40 MG PO TBEC
40.0000 mg | DELAYED_RELEASE_TABLET | Freq: Two times a day (BID) | ORAL | Status: DC
Start: 2013-08-06 — End: 2013-10-04

## 2013-08-06 MED ORDER — SUCRALFATE 1 G PO TABS: 1.0000 g | ORAL_TABLET | Freq: Three times a day (TID) | ORAL | Status: DC

## 2013-08-06 NOTE — Progress Notes (Signed)
Note: This document was prepared with digital dictation and possible smart phrase technology. Any transcriptional errors that result from this process are unintentional.   [name]:[id number] DOB: September 23, 1945 DOA: 08/04/2013 PCP: Shirline Frees, MD  Brief narrative: 68 y.o. ? known AAA 5.8 cm repair stent graft 07/14/13 [Dr. Trula Slade, recent Lap chole 06/19/13 [Dr. Ninfa Linden, prior known Diverticulitis, documented pancytopenia + splenomegally, prior smoker, h/o CAD s/[p Inf MI + RCA stent 10/2008 presented to [facility] ED 08/04/13 with recurrent nausea vomiting. GI was consulted   Past medical history-As per Problem list Chart reviewed as below- reviewed  Consultants:  GI-[facility]  Procedures: EGD 7/23 patchy erythema seen consistent with mild duodenitis. The  second portion of duodenum was unremarkable. The endoscope was  withdrawn back into the stomach and retroflexion revealed a small  hiatal hernia but otherwise normal proximal stomach.  Antibiotics:  none   Subjective   Had pain overnight and a lot of "belching" Had one episode of diarrhea last night Pain is now 2/10 and ill localized   Objective    Interim History: none  Telemetry: none   Objective: Filed Vitals:   08/05/13 1350 08/05/13 1416 08/05/13 2130 08/06/13 0533  BP: 107/48 132/62 113/48 134/71  Pulse: 58 53 53 55  Temp:  98.6 F (37 C) 99 F (37.2 C) 98.6 F (37 C)  TempSrc:  Oral Oral Oral  Resp: 20 18 18 18   Height:      Weight:    76.34 kg (168 lb 4.8 oz)  SpO2: 93% 88% 97% 92%    Intake/Output Summary (Last 24 hours) at 08/06/13 0924 Last data filed at 08/06/13 0535  Gross per 24 hour  Intake 578.83 ml  Output    300 ml  Net 278.83 ml    Exam:  General: eomi, ncat Cardiovascular: s1 s2 no m/r/g Respiratory: clear, no added sound Abdomen: soft, NT/ND Skin no le edema Neuro intact  Data Reviewed: Basic Metabolic Panel:  Recent Labs Lab 08/02/13 1955 08/04/13 0455  08/04/13 0620 08/05/13 0405  NA 137 141  --  143  K 3.9 3.6*  --  3.5*  CL 100 101  --  106  CO2 23 21  --  23  GLUCOSE 110* 142*  --  100*  BUN 18 23  --  23  CREATININE 1.21 1.18  --  1.12  CALCIUM 9.4 9.3  --  8.5  MG  --   --  2.1  --    Liver Function Tests:  Recent Labs Lab 08/02/13 1955 08/04/13 0455 08/05/13 0405  AST 15 20 41*  ALT 11 15 32  ALKPHOS 93 99 90  BILITOT 0.6 0.7 0.6  PROT 7.6 8.2 7.0  ALBUMIN 3.7 4.0 3.5    Recent Labs Lab 08/02/13 2019  LIPASE 32   No results found for this basename: AMMONIA,  in the last 168 hours CBC:  Recent Labs Lab 08/02/13 1955 08/04/13 0455 08/05/13 0405  WBC 4.4 3.2* 3.6*  NEUTROABS 3.0 2.4  --   HGB 8.1* 8.4* 8.0*  HCT 25.3* 26.9* 25.0*  MCV 89.1 90.3 91.6  PLT 118* 133* 126*   Cardiac Enzymes: No results found for this basename: CKTOTAL, CKMB, CKMBINDEX, TROPONINI,  in the last 168 hours BNP: No components found with this basename: POCBNP,  CBG: No results found for this basename: GLUCAP,  in the last 168 hours  No results found for this or any previous visit (from the past 240 hour(s)).  Studies:              All Imaging reviewed and is as per above notation   Scheduled Meds: . heparin  5,000 Units Subcutaneous 3 times per day  . LORazepam  1 mg Oral Once  . metoprolol tartrate  12.5 mg Oral BID  . pantoprazole (PROTONIX) IV  40 mg Intravenous Q12H  . sodium chloride  3 mL Intravenous Q12H  . sucralfate  1 g Oral TID WC & HS   Continuous Infusions: . sodium chloride 50 mL/hr at 08/05/13 2147     Assessment/Plan:  1. Gastritis with hiatal hernia causing abd pain-d/w Dr Michail Sermon I have put him on a soft diet to challenge him.  Await further input from gastroenterology and defer decision about discussion to GI 2. Htn-continue metoprolol 12.5 twice a day moderately controlled. 3. Mild bradycardia-monitor on tele and if no pauses can continue above dose. 4. H/o MI status post stent 10/2008.   Continue metoprolol for now as above-not currently on either Plavix or aspirin-hold for now but resume once gastritis issues resolved . 5. AAA (abdominal aortic aneurysm) s/p repa 06/2008. 6. Pancytopenia -unclear etiology. A relatively low however he does not have a gross drop in either blood count 4 CBC. Needs OP lymph node biopsy for confirmation  Code Status: Full Family Communication: discussed with wife on telephone Disposition Plan: inpatient   Verneita Griffes, MD  [facility] Pager [phone] 08/06/2013, 9:24 AM    LOS: 2 days

## 2013-08-06 NOTE — Progress Notes (Signed)
Reviewed discharge instructions with patient and wife, they stated their understanding.  1 week follow up appointment with patient's primary care doctor made and patient and wife updated.  Specific diet instructions reviewed with patient and wife also.  Discharged home with wife via wheelchair.  [name]

## 2013-08-06 NOTE — Discharge Summary (Signed)
Physician Discharge Summary  [name] GLO:[id number] DOB: 1945-08-25 DOA: 08/04/2013  PCP: Shirline Frees, MD  Admit date: 08/04/2013 Discharge date: 08/06/2013  Time spent: 35 minutes  Recommendations for Outpatient Follow-up:  1. Consider outpatient lymph node biopsy for workup of thrombocytopenia and pancytopenia-there was concern raised at a prior hospital stay for potential lymphoma or leukemia  2. Continue PPI Protonix 40 twice a day for at least a month-he is to follow with Dr. Wilford Corner for consideration of gastric emptying study as an outpatient which will be arrangedby them 3. Interval surveillance with vascular regarding AAA repair   Discharge Diagnoses:  Principal Problem:   Nausea & vomiting Active Problems:   HYPERTENSION, UNSPECIFIED   MYOCARDIAL INFARCTION, HX OF   Coronary atherosclerosis of native coronary artery   BRADYCARDIA   AAA (abdominal aortic aneurysm)   Epigastric abdominal pain   Discharge Condition: good  Diet recommendation: planned heart healthy  Filed Weights   08/04/13 0345 08/05/13 0532 08/06/13 0533  Weight: 77.111 kg (170 lb) 75.705 kg (166 lb 14.4 oz) 76.34 kg (168 lb 4.8 oz)    History of present illness:  68 y.o. ? known AAA 5.8 cm repair stent graft 07/14/13 [Dr. Trula Slade, recent Lap chole 06/19/13 [Dr. Ninfa Linden, prior known Diverticulitis, documented pancytopenia + splenomegally, prior smoker, h/o CAD s/[p Inf MI + RCA stent 10/2008 presented to [facility] ED 08/04/13 with recurrent nausea vomiting.  GI was consulted and patient underwentendoscopy showing gastritis see below   Hospital Course:   1. Gastritis with hiatal hernia causing abd pain-d/w Dr Rennie Natter tolerated soft diet and lunch and felt like he was comfortable enough to go home--UB discharge on Protonix twice a day and Carafate tablets 1 tab 4 times a day and will need follow up closely with gastroenterology as an outpatient who will arrange followup with  him 2. Htn-continue metoprolol 12.5 twice a day moderately controlled. 3. Mild bradycardia-monitor on tele and if no pauses can continue above dose. 4. H/o MI status post stent 10/2008. Continue metoprolol for now as above-not currently on either Plavix or aspirin-hold for now but resume once gastritis issues resolved. 5. AAA (abdominal aortic aneurysm) s/p repair 06/2008. 6. Pancytopenia -unclear etiology. Hemoglobin was low however he does not have a gross drop in either blood count 4 CBC. Needs OP lymph node biopsy for confirmation   Consultants:  [facility] Procedures:  EGD 7/23 patchy erythema seen consistent with mild duodenitis. The  second portion of duodenum was unremarkable. The endoscope was  withdrawn back into the stomach and retroflexion revealed a small  hiatal hernia but otherwise normal proximal stomach.  Antibiotics:  none   Discharge Exam: Filed Vitals:   08/06/13 1432  BP: 127/59  Pulse: 57  Temp: 97.9 F (36.6 C)  Resp: 16    General: alert pleasant oriented no apparent distress Cardiovascular: S1-S2 no murmur rub or gallop Respiratory: clear no added sounds  Discharge Instructions You were cared for by a hospitalist during your hospital stay. If you have any questions about your discharge medications or the care you received while you were in the hospital after you are discharged, you can call the unit and asked to speak with the hospitalist on call if the hospitalist that took care of you is not available. Once you are discharged, your primary care physician will handle any further medical issues. Please note that NO REFILLS for any discharge medications will be authorized once you are discharged, as it is imperative that you return  to your primary care physician (or establish a relationship with a primary care physician if you do not have one) for your aftercare needs so that they can reassess your need for medications and monitor your lab values.      Medication List    STOP taking these medications       omeprazole 20 MG capsule  Commonly known as:  PRILOSEC     sucralfate 1 GM/10ML suspension  Commonly known as:  CARAFATE  Replaced by:  sucralfate 1 G tablet      TAKE these medications       ferrous fumarate 325 (106 FE) MG Tabs tablet  Commonly known as:  HEMOCYTE - 106 mg FE  Take 1 tablet by mouth daily.     metoprolol tartrate 25 MG tablet  Commonly known as:  LOPRESSOR  Take 0.5 tablets (12.5 mg total) by mouth 2 (two) times daily.     nitroGLYCERIN 0.4 MG SL tablet  Commonly known as:  NITROSTAT  Place 0.4 mg under the tongue every 5 (five) minutes as needed for chest pain.     ondansetron 4 MG disintegrating tablet  Commonly known as:  ZOFRAN ODT  Take 1 tablet (4 mg total) by mouth every 8 (eight) hours as needed for nausea or vomiting.     pantoprazole 40 MG tablet  Commonly known as:  PROTONIX  Take 1 tablet (40 mg total) by mouth 2 (two) times daily.     promethazine 25 MG suppository  Commonly known as:  PHENERGAN  Place 1 suppository (25 mg total) rectally every 6 (six) hours as needed for nausea or vomiting.     RESTORA Caps  Take 1 capsule by mouth daily.     rosuvastatin 10 MG tablet  Commonly known as:  CRESTOR  Take 1 tablet (10 mg total) by mouth daily.     sucralfate 1 G tablet  Commonly known as:  CARAFATE  Take 1 tablet (1 g total) by mouth 4 (four) times daily -  with meals and at bedtime.       Allergies  Allergen Reactions  . Cephalexin Other (See Comments)    Nausea and stomach cramps  . Phenergan [Promethazine Hcl] Other (See Comments)    "restless legs"      The results of significant diagnostics from this hospitalization (including imaging, microbiology, ancillary and laboratory) are listed below for reference.    Significant Diagnostic Studies: Dg Abd Acute W/chest  08/02/2013   CLINICAL DATA:  Abdominal pain, nausea and vomiting.  EXAM: ACUTE ABDOMEN SERIES  (ABDOMEN 2 VIEW & CHEST 1 VIEW)  COMPARISON:  Chest radiograph performed 07/02/2013, and CTA of the abdomen and pelvis performed 07/07/2013  FINDINGS: The lungs are well-aerated and clear. There is no evidence of focal opacification, pleural effusion or pneumothorax. The cardiomediastinal silhouette is within normal limits.  The visualized bowel gas pattern is unremarkable. Scattered stool and air are seen within the colon; there is no evidence of small bowel dilatation to suggest obstruction. No free intra-abdominal air is identified on the provided upright view.  No acute osseous abnormalities are seen; the sacroiliac joints are unremarkable in appearance. An aortoiliac stent graft is noted. Clips are noted within the right upper quadrant, reflecting prior cholecystectomy.  IMPRESSION: 1. Unremarkable bowel gas pattern; no free intra-abdominal air seen. Small amount of stool in the colon. 2. No acute cardiopulmonary process seen.   Electronically Signed   By: Garald Balding M.D.   On: 08/02/2013  23:06   Ct Angio Abd/pel W/ And/or W/o  07/07/2013   CLINICAL DATA:  Fever.  EXAM: CT ANGIOGRAPHY ABDOMEN AND PELVIS WITH CONTRAST AND WITHOUT CONTRAST  TECHNIQUE: Multidetector CT imaging of the abdomen and pelvis was performed using the standard protocol during bolus administration of intravenous contrast. Multiplanar reconstructed images and MIPs were obtained and reviewed to evaluate the vascular anatomy.  CONTRAST:  74mL OMNIPAQUE IOHEXOL 300 MG/ML SOLN, 21mL OMNIPAQUE IOHEXOL 350 MG/ML SOLN  COMPARISON:  06/14/2013  FINDINGS: Aorto bi-iliac stent graft repair has been performed. There is no evidence of endoleak. Maximal AP and transverse diameters of the aneurysm sac are 5.8 and 5.7 cm. Previously, maximal diameter was 5.8 cm. Aortic and iliac lumens are patent. There is some stranding in the retroperitoneal fat surrounding the lower aortic aneurysm sac, and aortic bifurcation. There is no contrast extravasation  to suggest active bleeding.  Celiac is patent.  Branch vessels are patent.  SMA is patent. Branch vessels are patent. Replaced right hepatic artery.  IMA origin is occluded. It reconstitutes. Branch vessels are diminutive and patent.  Single renal arteries are patent.  Distal right common iliac artery is patent. Right internal and external iliac arteries are patent. There is 50% narrowing of the right common femoral artery just below the inguinal ligament.  Distal left common iliac artery is patent and ectatic with a maximal diameter of 16 mm. Atherosclerotic changes at the origin of the left external iliac artery does not appear significant. Is there after patent. Significant disease at the origin of the left internal iliac artery is present.  Postcholecystectomy. No residual fluid collection in the gallbladder bed.  Liver, spleen, pancreas are within normal limits.  Stable left adrenal adenoma.  Normal right adrenal gland.  Stable renal hypodensities.  Stable sigmoid diverticulosis. There is subtle stranding in the fat lateral to the sigmoid colon on image 121. No extraluminal bowel gas. No loculated fluid collection.  L5-S1 degenerative disc disease.  Review of the MIP images confirms the above findings.  IMPRESSION: Status post aorto bi-iliac stent graft repair without endoleak.  Subtle stranding surrounding the lower aortic aneurysm sac hand aortic bifurcation which is likely due to expected edema in the postoperative state. An inflammatory process is not entirely excluded. There is no finding to suggest active bleeding.  Sigmoid diverticulosis. There is subtle stranding adjacent to the sigmoid colon. Early inflammatory process is not excluded.   Electronically Signed   By: Maryclare Bean M.D.   On: 07/07/2013 16:21    Microbiology: No results found for this or any previous visit (from the past 240 hour(s)).   Labs: Basic Metabolic Panel:  Recent Labs Lab 08/02/13 1955 08/04/13 0455 08/04/13 0620  08/05/13 0405  NA 137 141  --  143  K 3.9 3.6*  --  3.5*  CL 100 101  --  106  CO2 23 21  --  23  GLUCOSE 110* 142*  --  100*  BUN 18 23  --  23  CREATININE 1.21 1.18  --  1.12  CALCIUM 9.4 9.3  --  8.5  MG  --   --  2.1  --    Liver Function Tests:  Recent Labs Lab 08/02/13 1955 08/04/13 0455 08/05/13 0405  AST 15 20 41*  ALT 11 15 32  ALKPHOS 93 99 90  BILITOT 0.6 0.7 0.6  PROT 7.6 8.2 7.0  ALBUMIN 3.7 4.0 3.5    Recent Labs Lab 08/02/13 2019  LIPASE 32  No results found for this basename: AMMONIA,  in the last 168 hours CBC:  Recent Labs Lab 08/02/13 1955 08/04/13 0455 08/05/13 0405  WBC 4.4 3.2* 3.6*  NEUTROABS 3.0 2.4  --   HGB 8.1* 8.4* 8.0*  HCT 25.3* 26.9* 25.0*  MCV 89.1 90.3 91.6  PLT 118* 133* 126*   Cardiac Enzymes: No results found for this basename: CKTOTAL, CKMB, CKMBINDEX, TROPONINI,  in the last 168 hours BNP: BNP (last 3 results) No results found for this basename: PROBNP,  in the last 8760 hours CBG: No results found for this basename: GLUCAP,  in the last 168 hours     Signed:  Nita Sells  [facility] 08/06/2013, 2:42 PM

## 2013-08-06 NOTE — ED Provider Notes (Signed)
Medical screening examination/treatment/procedure(s) were conducted as a shared visit with non-physician practitioner(s) and myself.  I personally evaluated the patient during the encounter.   EKG Interpretation   Date/Time:  Monday August 02 2013 19:30:28 EDT Ventricular Rate:  53 PR Interval:  169 QRS Duration: 92 QT Interval:  485 QTC Calculation: 455 R Axis:   62 Text Interpretation:  Sinus rhythm No significant change since last  tracing Confirmed by Tushar Enns  MD, Morene Cecilio ([id number]) on 08/02/2013 8:47:06 PM       Patient with abd pain and vomiting, but on my exam his pain has resolved. Highly doubt complication of AAA with no pain at this time, no current symptoms. Will d/c with return precautions. No imaging indicated at this time.   Ephraim Hamburger, MD 08/06/13 2202

## 2013-08-06 NOTE — Progress Notes (Signed)
Patient ID: [name], male   DOB: 13-Dec-1945, 68 y.o.   MRN: [id number] [facility] Gastroenterology Progress Note  [name] 68 y.o. [birth year]   Subjective: Tolerating soft diet without N/V. Reports belching overnight. Minimal left-mid quadrant pain.  Objective: Vital signs in last 24 hours: Filed Vitals:   08/06/13 0533  BP: 134/71  Pulse: 55  Temp: 98.6 F (37 C)  Resp: 18    Physical Exam: Gen: alert, no acute distress Abd: soft, minimal tenderness in left-mid quadrant without guarding, nondistended, +BS  Lab Results:  Recent Labs  08/04/13 0455 08/04/13 0620 08/05/13 0405  NA 141  --  143  K 3.6*  --  3.5*  CL 101  --  106  CO2 21  --  23  GLUCOSE 142*  --  100*  BUN 23  --  23  CREATININE 1.18  --  1.12  CALCIUM 9.3  --  8.5  MG  --  2.1  --     Recent Labs  08/04/13 0455 08/05/13 0405  AST 20 41*  ALT 15 32  ALKPHOS 99 90  BILITOT 0.7 0.6  PROT 8.2 7.0  ALBUMIN 4.0 3.5    Recent Labs  08/04/13 0455 08/05/13 0405  WBC 3.2* 3.6*  NEUTROABS 2.4  --   HGB 8.4* 8.0*  HCT 26.9* 25.0*  MCV 90.3 91.6  PLT 133* 126*    Recent Labs  08/05/13 0405  LABPROT 16.3*  INR 1.31      Assessment/Plan: 68 yo with recurrent N/V/abdominal pain. EGD unrevealing and will f/u on biopsies as an outpt. Suspect dyspepsia with GERD source of his symptoms. He does need a colonoscopy in August but no acute need to do as inpt at this time. Advance diet and f/u as outpt. If able to tolerate diet, then ok to go home from GI standpoint. F/U with me in 3-4 weeks. If continues to have N/V then will need gastric emptying scan. Will sign off. Call if questions. Dr. Penelope Coop available to see this weekend if necessary.   [place] C. 08/06/2013, 11:08 AM

## 2013-08-06 NOTE — Discharge Instructions (Signed)

## 2013-08-09 ENCOUNTER — Ambulatory Visit: Payer: Medicare Other | Admitting: Surgery

## 2013-08-10 ENCOUNTER — Other Ambulatory Visit (HOSPITAL_COMMUNITY): Payer: Self-pay | Admitting: Gastroenterology

## 2013-08-10 DIAGNOSIS — R112 Nausea with vomiting, unspecified: Secondary | ICD-10-CM

## 2013-08-11 ENCOUNTER — Encounter (HOSPITAL_COMMUNITY): Payer: Medicare Other

## 2013-08-17 ENCOUNTER — Encounter (HOSPITAL_COMMUNITY): Payer: Medicare Other

## 2013-08-20 ENCOUNTER — Encounter: Payer: Self-pay | Admitting: Surgery

## 2013-08-23 ENCOUNTER — Encounter: Payer: Self-pay | Admitting: Surgery

## 2013-08-23 ENCOUNTER — Ambulatory Visit (INDEPENDENT_AMBULATORY_CARE_PROVIDER_SITE_OTHER): Payer: Medicare Other | Admitting: Surgery

## 2013-08-23 VITALS — BP 132/60 | HR 54 | Ht 70.0 in | Wt 168.0 lb

## 2013-08-23 DIAGNOSIS — I714 Abdominal aortic aneurysm, without rupture, unspecified: Secondary | ICD-10-CM

## 2013-08-23 NOTE — Addendum Note (Signed)
Addended by: Mena Goes on: 08/23/2013 05:19 PM   Modules accepted: Orders

## 2013-08-23 NOTE — Progress Notes (Signed)
The patient preoperative course was very complicated by abdominal pain with nausea and vomiting.  He ultimately underwent laparoscopic cholecystectomy which did not help his symptoms.  He continues to have trouble.  He has lost 53 pounds over the summer.  He is scheduled for endoscopy in a few days.  On examination his abdominal exam is unremarkable. His femoral cannulation sites are without complication.  I reviewed his CT angiogram.  This showed the stent graft to be in good position without evidence of endoleak.  Aneurysm remained stable in size of 5.8 cm.  The patient will followup in 6 months with a abdominal ultrasound.  His preoperative carotid ultrasound was unremarkable with bilateral less than 39% stenosis

## 2013-10-04 ENCOUNTER — Encounter (HOSPITAL_COMMUNITY): Payer: Self-pay | Admitting: Pharmacy Technician

## 2013-10-12 ENCOUNTER — Other Ambulatory Visit: Payer: Self-pay | Admitting: Gastroenterology

## 2013-10-12 NOTE — Addendum Note (Signed)
Addended by: Wilford Corner on: 10/12/2013 01:43 PM   Modules accepted: Orders

## 2013-10-14 ENCOUNTER — Encounter (HOSPITAL_COMMUNITY): Payer: Self-pay | Admitting: *Deleted

## 2013-10-15 ENCOUNTER — Ambulatory Visit (HOSPITAL_COMMUNITY)
Admission: RE | Admit: 2013-10-15 | Discharge: 2013-10-15 | Disposition: A | Discharge status: Home / Self Care | Payer: Medicare Other | Source: Ambulatory Visit | Attending: Gastroenterology | Admitting: Gastroenterology

## 2013-10-15 ENCOUNTER — Encounter (HOSPITAL_COMMUNITY): Payer: Medicare Other | Admitting: Anesthesiology

## 2013-10-15 ENCOUNTER — Encounter (HOSPITAL_COMMUNITY): Payer: Self-pay | Admitting: Anesthesiology

## 2013-10-15 ENCOUNTER — Ambulatory Visit (HOSPITAL_COMMUNITY): Payer: Medicare Other | Admitting: Anesthesiology

## 2013-10-15 ENCOUNTER — Encounter (HOSPITAL_COMMUNITY)
Admission: RE | Disposition: A | Discharge status: Home / Self Care | Payer: Self-pay | Source: Ambulatory Visit | Attending: Gastroenterology

## 2013-10-15 DIAGNOSIS — Z1211 Encounter for screening for malignant neoplasm of colon: Secondary | ICD-10-CM | POA: Diagnosis present

## 2013-10-15 DIAGNOSIS — D124 Benign neoplasm of descending colon: Secondary | ICD-10-CM | POA: Diagnosis not present

## 2013-10-15 DIAGNOSIS — I2510 Atherosclerotic heart disease of native coronary artery without angina pectoris: Secondary | ICD-10-CM | POA: Insufficient documentation

## 2013-10-15 DIAGNOSIS — I340 Nonrheumatic mitral (valve) insufficiency: Secondary | ICD-10-CM | POA: Diagnosis not present

## 2013-10-15 DIAGNOSIS — D122 Benign neoplasm of ascending colon: Secondary | ICD-10-CM | POA: Diagnosis not present

## 2013-10-15 DIAGNOSIS — K621 Rectal polyp: Secondary | ICD-10-CM | POA: Diagnosis not present

## 2013-10-15 DIAGNOSIS — I714 Abdominal aortic aneurysm, without rupture: Secondary | ICD-10-CM | POA: Insufficient documentation

## 2013-10-15 DIAGNOSIS — K5790 Diverticulosis of intestine, part unspecified, without perforation or abscess without bleeding: Secondary | ICD-10-CM | POA: Insufficient documentation

## 2013-10-15 DIAGNOSIS — I10 Essential (primary) hypertension: Secondary | ICD-10-CM | POA: Diagnosis not present

## 2013-10-15 DIAGNOSIS — Z87891 Personal history of nicotine dependence: Secondary | ICD-10-CM | POA: Diagnosis not present

## 2013-10-15 DIAGNOSIS — K648 Other hemorrhoids: Secondary | ICD-10-CM | POA: Diagnosis not present

## 2013-10-15 DIAGNOSIS — I252 Old myocardial infarction: Secondary | ICD-10-CM | POA: Insufficient documentation

## 2013-10-15 HISTORY — PX: COLONOSCOPY WITH PROPOFOL: SHX5780

## 2013-10-15 HISTORY — DX: Gastro-esophageal reflux disease without esophagitis: K21.9

## 2013-10-15 SURGERY — COLONOSCOPY WITH PROPOFOL
Anesthesia: Monitor Anesthesia Care

## 2013-10-15 MED ORDER — LACTATED RINGERS IV SOLN
INTRAVENOUS | Status: DC
  Administered 2013-10-15: 1000 mL via INTRAVENOUS

## 2013-10-15 MED ORDER — ONDANSETRON HCL 4 MG/2ML IJ SOLN
INTRAMUSCULAR | Status: DC | PRN
  Administered 2013-10-15: 4 mg via INTRAVENOUS

## 2013-10-15 MED ORDER — MIDAZOLAM HCL 5 MG/5ML IJ SOLN
INTRAMUSCULAR | Status: DC | PRN
  Administered 2013-10-15: 1 mg via INTRAVENOUS

## 2013-10-15 MED ORDER — FENTANYL CITRATE 0.05 MG/ML IJ SOLN
INTRAMUSCULAR | Status: DC | PRN
  Administered 2013-10-15: 50 ug via INTRAVENOUS

## 2013-10-15 MED ORDER — PROPOFOL INFUSION 10 MG/ML OPTIME
INTRAVENOUS | Status: DC | PRN
  Administered 2013-10-15: 50 ug/kg/min via INTRAVENOUS

## 2013-10-15 MED ORDER — SPOT INK MARKER SYRINGE KIT
PACK | SUBMUCOSAL | Status: AC
  Filled 2013-10-15: qty 5

## 2013-10-15 MED ORDER — SODIUM CHLORIDE 0.9 % IV SOLN
INTRAVENOUS | Status: DC
  Administered 2013-10-15 (×2): via INTRAVENOUS

## 2013-10-15 NOTE — Discharge Instructions (Addendum)
No aspirin for 1 week. NO Ibuprofen products for 2 weeks. Will call when pathology results are complete.    Colonoscopy, Care After These instructions give you information on caring for yourself after your procedure. Your doctor may also give you more specific instructions. Call your doctor if you have any problems or questions after your procedure. HOME CARE  Do not drive for 24 hours.  Do not sign important papers or use machinery for 24 hours.  You may shower.  You may go back to your usual activities, but go slower for the first 24 hours.  Take rest breaks often during the first 24 hours.  Walk around or use warm packs on your belly (abdomen) if you have belly cramping or gas.  Drink enough fluids to keep your pee (urine) clear or pale yellow.  Resume your normal diet. Avoid heavy or fried foods.  Avoid drinking alcohol for 24 hours or as told by your doctor.  Only take medicines as told by your doctor. If a tissue sample (biopsy) was taken during the procedure:   Do not take aspirin or blood thinners for 7 days, or as told by your doctor.  Do not drink alcohol for 7 days, or as told by your doctor.  Eat soft foods for the first 24 hours. GET HELP IF: You still have a small amount of blood in your poop (stool) 2-3 days after the procedure. GET HELP RIGHT AWAY IF:  You have more than a small amount of blood in your poop.  You see clumps of tissue (blood clots) in your poop.  Your belly is puffy (swollen).  You feel sick to your stomach (nauseous) or throw up (vomit).  You have a fever.  You have belly pain that gets worse and medicine does not help. MAKE SURE YOU:  Understand these instructions.  Will watch your condition.  Will get help right away if you are not doing well or get worse. Document Released: 02/02/2010 Document Revised: 01/05/2013 Document Reviewed: 09/07/2012 [facility] Patient Information 2015 [place], [place]. This information is not intended  to replace advice given to you by your health care provider. Make sure you discuss any questions you have with your health care provider.

## 2013-10-15 NOTE — H&P (Signed)
  Date of Initial H&P: 10/08/13  History reviewed, patient examined, no change in status, stable for surgery.

## 2013-10-15 NOTE — Anesthesia Preprocedure Evaluation (Addendum)
Anesthesia Evaluation  Patient identified by MRN, date of birth, ID band Patient awake    Reviewed: Allergy & Precautions, H&P , NPO status , Patient's Chart, lab work & pertinent test results, reviewed documented beta blocker date and time   History of Anesthesia Complications (+) PONV  Airway Mallampati: III TM Distance: >3 FB Neck ROM: Full    Dental  (+) Dental Advisory Given   Pulmonary former smoker,  breath sounds clear to auscultation        Cardiovascular hypertension, + CAD, + Past MI and + Peripheral Vascular Disease (s/p EVAR for AAA) + dysrhythmias Rhythm:Regular Rate:Normal     Neuro/Psych    GI/Hepatic Neg liver ROS, GERD-  ,  Endo/Other  negative endocrine ROS  Renal/GU negative Renal ROS     Musculoskeletal  (+) Arthritis -,   Abdominal   Peds  Hematology  (+) anemia ,   Anesthesia Other Findings 2010 TTE  1. Left ventricle: The cavity size was normal. Wall thickness was     increased in a pattern of mild LVH. Systolic function was normal.     The estimated ejection fraction was in the range of 60% to 65%.  2. Mitral valve: Mild regurgitation.  3. Pulmonary arteries: PA peak pressure: 56mm Hg (S).      Reproductive/Obstetrics                        Anesthesia Physical Anesthesia Plan  ASA: III  Anesthesia Plan: MAC   Post-op Pain Management:    Induction: Intravenous  Airway Management Planned: Mask  Additional Equipment:   Intra-op Plan:   Post-operative Plan:   Informed Consent: I have reviewed the patients History and Physical, chart, labs and discussed the procedure including the risks, benefits and alternatives for the proposed anesthesia with the patient or authorized representative who has indicated his/her understanding and acceptance.   Dental advisory given  Plan Discussed with: CRNA, Anesthesiologist and Surgeon  Anesthesia Plan Comments:          Anesthesia Quick Evaluation

## 2013-10-15 NOTE — Transfer of Care (Signed)
Immediate Anesthesia Transfer of Care Note  Patient: [name]  Procedure(s) Performed: Procedure(s): COLONOSCOPY WITH PROPOFOL (N/A)  Patient Location: PACU  Anesthesia Type:MAC  Level of Consciousness: awake, alert  and oriented  Airway & Oxygen Therapy: Patient Spontanous Breathing and Patient connected to nasal cannula oxygen  Post-op Assessment: Post -op Vital signs reviewed and stable  Post vital signs: Reviewed and stable  Complications: No apparent anesthesia complications

## 2013-10-15 NOTE — Interval H&P Note (Signed)
History and Physical Interval Note:  10/15/2013 8:24 AM  [name]  has presented today for surgery, with the diagnosis of screening  The various methods of treatment have been discussed with the patient and family. After consideration of risks, benefits and other options for treatment, the patient has consented to  Procedure(s): COLONOSCOPY WITH PROPOFOL (N/A) as a surgical intervention .  The patient's history has been reviewed, patient examined, no change in status, stable for surgery.  I have reviewed the patient's chart and labs.  Questions were answered to the patient's satisfaction.     [place] C.

## 2013-10-15 NOTE — Op Note (Addendum)
[facility] [place], [id number]   COLONOSCOPY PROCEDURE REPORT     EXAM DATE: 10/15/2013  PATIENT NAME:      [name], [name]           MR #:      [id number]  BIRTHDATE:       19-May-1945      VISIT #:     [phone]  ATTENDING:     Wilford Corner, MD     STATUS:     outpatient REFERRING MD: ASA CLASS:        Class III  INDICATIONS:  The patient is a 68 yr old male here for a colonoscopy due to average risk for colon cancer. PROCEDURE PERFORMED:     Colonoscopy with snare polypectomy MEDICATIONS:     Monitored anesthesia care, Propofol per Anesthesia  ESTIMATED BLOOD LOSS:     None  CONSENT: The patient understands the risks and benefits of the procedure and understands that these risks include, but are not limited to: sedation, allergic reaction, infection, perforation and/or bleeding. Alternative means of evaluation and treatment include, among others: physical exam, x-rays, and/or surgical intervention. The patient elects to proceed with this endoscopic procedure.  DESCRIPTION OF PROCEDURE: During intra-op preparation period all mechanical & medical equipment was checked for proper function. Hand hygiene and appropriate measures for infection prevention was taken. After the risks, benefits and alternatives of the procedure were thoroughly explained, Informed consent was verified, confirmed and timeout was successfully executed by the treatment team. A digital exam revealed no abnormalities of the rectum.      The Pentax Ped Colon [id number] endoscope was introduced through the anus and advanced to the cecum, which was identified by both the appendix and ileocecal valve. The prep was good.. The instrument was then slowly withdrawn as the colon was fully examined. During insertion multiple polyps were seen with the largest being a pedunculated polyp in the distal descending colon. 13 sessile polyps (size range 5 mm - 1.2 cm)  were removed with snare cautery from the ascending colon, transverse colon, descending colon, sigmoid colon, and rectum with snare cautery without any immediate complications. No post-polypectomy bleeding seen. A large pedunculated polyp (3 centimeters in size) was removed from the distal descending polyp with snare cautery and 4 cc of inkspot was used to tattoo the polypectomy site. No post-polypectomy bleeding seen at this site. Diffuse left-sided diverticulosis noted. Retroflexed views revealed internal hemorrhoids.  The scope was then completely withdrawn from the patient and the procedure terminated.     ADVERSE EVENTS:      There were no immediate complications.   IMPRESSIONS:     1. Multiple polyps removed with snare cautery as stated above (14 total removed) 2. Left-sided diverticulosis 3. Internal hemorrhoids  RECOMMENDATIONS:     No aspirin products for 1 week; F/U on path; High fiber diet; Repeat colonoscopy depends on pathology    Wilford Corner, MD eSigned:  Wilford Corner, MD 10/15/2013 10:02 AM Revised: 10/15/2013 10:02 AM  cc:  Shirline Frees MD  CPT CODES: ICD CODES:  The ICD and CPT codes recommended by this software are interpretations from the data that the clinical staff has captured with the software.  The verification of the translation of this report to the ICD and CPT codes and modifiers is the sole responsibility of the health care institution and practicing physician where this report was generated.  [employer] will not be held  responsible for the validity of the ICD and CPT codes included on this report.  AMA assumes no liability for data contained or not contained herein. CPT is a Designer, television/film set of the [employer].  PATIENT NAME:  Amadeus, Oyama MR#: [id number]

## 2013-10-18 ENCOUNTER — Encounter (HOSPITAL_COMMUNITY): Payer: Self-pay | Admitting: Gastroenterology

## 2013-10-25 NOTE — Anesthesia Postprocedure Evaluation (Signed)
  Anesthesia Post-op Note  Patient: [name]  Procedure(s) Performed: Procedure(s): COLONOSCOPY WITH PROPOFOL (N/A)  Patient Location: PACU  Anesthesia Type:MAC  Level of Consciousness: awake, alert  and oriented  Airway and Oxygen Therapy: Patient Spontanous Breathing  Post-op Pain: none  Post-op Assessment: Post-op Vital signs reviewed  Post-op Vital Signs: Reviewed  Last Vitals:  Filed Vitals:   10/15/13 1020  BP: 117/63  Pulse: 49  Temp:   Resp: 13    Complications: No apparent anesthesia complications

## 2013-11-04 ENCOUNTER — Telehealth: Payer: Self-pay

## 2013-11-04 DIAGNOSIS — Z95828 Presence of other vascular implants and grafts: Secondary | ICD-10-CM

## 2013-11-04 DIAGNOSIS — I714 Abdominal aortic aneurysm, without rupture, unspecified: Secondary | ICD-10-CM

## 2013-11-04 DIAGNOSIS — Z48812 Encounter for surgical aftercare following surgery on the circulatory system: Secondary | ICD-10-CM

## 2013-11-04 DIAGNOSIS — M79605 Pain in left leg: Secondary | ICD-10-CM

## 2013-11-04 NOTE — Telephone Encounter (Signed)
Pt. and wife called, together, to discuss ongoing problems with his left leg.  Reported that he had back pain and the left leg was weak, prior to his aneurysm repair.  Stated the pt. was told that the back pain was likely related to his aneurysm. Reported that approx. 6 wks. after the procedure, he started having an aching and throbbing in the left thigh, when he initially gets out of bed in the AM; stated after taking Tylenol 2 tabs, and walking around about 2 hrs., the discomfort eases-up, until later in the day, when he is tired, and it starts-up again.   The pt's wife recalled that it was noted prior to surgery, there was some decreased circulation in the left leg.  Pt. explained that during night, he feels like his left thigh tightens-up and he has to sleep with the leg extended; then in the morning he feels like he has to walk to get the thigh area to loosen-up.  Denies swelling in left leg/ thigh.  Denies any decrease in sensation of the left leg; reported the right leg had numbness, initially, after surgery, but has improved.  Wife questions if the discomfort in left leg is "normal" at this time frame, past his procedure in June?

## 2013-11-08 NOTE — Telephone Encounter (Signed)
Discussed pt's symptoms with Dr. Trula Slade.  Recommended to schedule CTA Abd/ Pelvis with contrast and f/u in office to further evaluate pt's symptoms. Pt. will be scheduled as recommended.

## 2013-11-09 NOTE — Telephone Encounter (Signed)
Spoke with patients wife to give appts- she is aware of need for BUN and Creatinine (order faxed to [facility]) and fasting for CTA as well.  Appt: 11/22/13 @ 12:30 for CTA and 2:00 with [facility]

## 2013-11-18 ENCOUNTER — Other Ambulatory Visit: Payer: Self-pay | Admitting: Surgery

## 2013-11-18 LAB — CREATININE, SERUM: Creat: 1.24 mg/dL (ref 0.50–1.35)

## 2013-11-18 LAB — BUN: BUN: 25 mg/dL — ABNORMAL HIGH (ref 6–23)

## 2013-11-19 ENCOUNTER — Encounter: Payer: Self-pay | Admitting: Surgery

## 2013-11-22 ENCOUNTER — Ambulatory Visit
Admission: RE | Admit: 2013-11-22 | Discharge: 2013-11-22 | Disposition: A | Discharge status: Home / Self Care | Payer: Medicare Other | Source: Ambulatory Visit | Attending: Surgery | Admitting: Surgery

## 2013-11-22 ENCOUNTER — Ambulatory Visit (INDEPENDENT_AMBULATORY_CARE_PROVIDER_SITE_OTHER): Payer: Medicare Other | Admitting: Surgery

## 2013-11-22 ENCOUNTER — Encounter: Payer: Self-pay | Admitting: Surgery

## 2013-11-22 VITALS — BP 139/66 | HR 58 | Ht 70.0 in | Wt 184.9 lb

## 2013-11-22 DIAGNOSIS — I2510 Atherosclerotic heart disease of native coronary artery without angina pectoris: Secondary | ICD-10-CM

## 2013-11-22 DIAGNOSIS — I714 Abdominal aortic aneurysm, without rupture, unspecified: Secondary | ICD-10-CM

## 2013-11-22 DIAGNOSIS — Z48812 Encounter for surgical aftercare following surgery on the circulatory system: Secondary | ICD-10-CM

## 2013-11-22 DIAGNOSIS — M79605 Pain in left leg: Secondary | ICD-10-CM

## 2013-11-22 DIAGNOSIS — Z95828 Presence of other vascular implants and grafts: Secondary | ICD-10-CM

## 2013-11-22 MED ORDER — IOHEXOL 350 MG/ML SOLN
80.0000 mL | Freq: Once | INTRAVENOUS | Status: AC | PRN
  Administered 2013-11-22: 80 mL via INTRAVENOUS

## 2013-11-22 NOTE — Addendum Note (Signed)
Addended by: Mena Goes on: 11/22/2013 05:09 PM   Modules accepted: Orders

## 2013-11-22 NOTE — Progress Notes (Signed)
Established EVAR  History of Present Illness  [name] is a 68 y.o. (08/23/1945) male who presents for routine follow up s/p EVAR (Date: 07/02/2013).  The previous CT angiogram showed the stent graft to be in good position without evidence of endoleak. Aneurysm remained stable in size of 5.8 cm.  He is eating well and tolerating activities of daily life.  He has a new complaint of left posterior thigh pain that resolves after mobility and tylenol once daily.   The patient preoperative course was very complicated by abdominal pain with nausea and vomiting. He ultimately underwent laparoscopic cholecystectomy which did not help his symptoms. He continues to have trouble. He has lost 53 pounds over the summer. He has gained 25 lbs. And is eating without any problems.   The patient's PMH, PSH, SH, FamHx, Med, and Allergies are unchanged from .  On ROS today: no N/V/D, no fever, chills, no abdominal pain.  Other wise negative review of systems.  Physical Examination  Filed Vitals:   11/22/13 1446  BP: 139/66  Pulse: 58  Height: 5\' 10"  (1.778 m)  Weight: 184 lb 14.4 oz (83.87 kg)  SpO2: 100%   Body mass index is 26.53 kg/(m^2).  General: A&O x 3  Lungs CTA bil.  Cardiac: RRR Vascular: Vessel Right Left  Radial Palpable Palpable  Brachial Palpable Palpable  Carotid Palpable, without bruit Palpable, without bruit  Aorta Not palpable N/A  Femoral Palpable Palpable  Popliteal Not palpable Not palpable  PT Palpable Palpable  DP Palpable Palpable   Gastrointestinal: soft, NTND  Musculoskeletal: M/S 5/5 throughout , Extremities without ischemic changes   Non-Invasive Vascular Imaging   CTA Abd/Pelvis (Date: 11/22/2012)  AAA sac size: 3.4 cm x 4.0 cm  Without endoleak detected   Medical Decision Making   [name] is a 68 y.o. male who presents s/p EVAR.  At this point he is in a stable condition.  Dr. Trula Slade reviewed the CTA today and was pleased that  the aneurysm has shrunk.  He may have left posterior thigh pain that gets better with activity and tylenol.  The Cta shows patent blood flow to the lower extremities.  He will continue to increase his activity and take tylenol PRN.  If the posterior thigh pain gets worse he should f/u with an orthopedic physician.   [facility] [place] [facility] Office: [phone]   11/22/2013, 3:02 PM  I agree with the above.  The patient is back for follow-up.  He has been complaining of some pain in his left thigh.  He is status post endovascular aneurysm repair on 07/02/2013.  Prior to his operation he had issues with abdominal pain and weight loss.  He was initially diagnosed with diverticulitis, however he didn't undergo laparoscopic cholecystectomy secondary to biliary dyskinesia.  His aneurysm repair went without significant issues.  His first postoperative scan showed a endoleak.  I brought him back today to make sure that the stent graft was in good position and that there were no interval change.  I have reviewed his CT scan.  The device remains in good position.  The aneurysm has gotten significantly smaller with a maximum diameter of 4 cm.  The stranding around the aorta has resolved.  The endoleak has resolved.  I reassured the patient that I do not think his symptoms are related to his blood flow.  He has a history of degenerative back disease which I have told him to get further  evaluated.  He is scheduled to see me again in 6 months with an abdominal ultrasound  [name]

## 2013-11-23 ENCOUNTER — Ambulatory Visit: Payer: Medicare Other | Admitting: Cardiovascular Disease

## 2014-01-21 ENCOUNTER — Encounter: Payer: Self-pay | Admitting: Cardiovascular Disease

## 2014-01-21 ENCOUNTER — Ambulatory Visit (INDEPENDENT_AMBULATORY_CARE_PROVIDER_SITE_OTHER): Payer: Medicare Other | Admitting: Cardiovascular Disease

## 2014-01-21 VITALS — BP 140/72 | HR 55 | Ht 70.0 in | Wt 191.4 lb

## 2014-01-21 DIAGNOSIS — I2510 Atherosclerotic heart disease of native coronary artery without angina pectoris: Secondary | ICD-10-CM

## 2014-01-21 DIAGNOSIS — E7800 Pure hypercholesterolemia, unspecified: Secondary | ICD-10-CM

## 2014-01-21 MED ORDER — SIMVASTATIN 20 MG PO TABS: 20.0000 mg | ORAL_TABLET | Freq: Every day | ORAL | Status: DC

## 2014-01-21 NOTE — Progress Notes (Signed)
Background: The patient has coronary artery disease and initially presented with an inferior wall MI and 2010. The patient was treated with primary PCI of the right coronary artery. He has moderate distal left main stem stenosis and moderate left circumflex stenosis. He's been treated medically. Followup intravascular ultrasound was performed. The patient underwent a stress nuclear study that showed no significant ischemia. His LV function has been normal. In 2015 he underwent EVAR for a AAA by Dr Trula Slade. Follow-up CTA imaging has documented a good result.  HPI:  69 year-old gentleman presenting for follow-up evaluation. Last year was really difficult for him. He had a prolonged hospitalization when he underwent cholecystectomy and aneurysm repair. His situation was complicated by intractable nausea and vomiting. He also lost 2 brothers chronic illnesses.  From a symptomatic perspective he is doing okay. He continues to smoke cigarettes only on rare occasions. He denies chest pain, shortness of breath, or leg swelling. He's had no heart palpitations. He would like to change statin drugs because Crestor is a high-tier medication.  Studies:  Lipid Panel     Component Value Date/Time   CHOL 81 05/25/2013 1140   TRIG 94.0 05/25/2013 1140   HDL 24.40* 05/25/2013 1140   CHOLHDL 3 05/25/2013 1140   VLDL 18.8 05/25/2013 1140   LDLCALC 38 05/25/2013 1140   LDLDIRECT 55.2 10/28/2012 1114   Myoview Scan 05/2013: FINDINGS: SPECT: Inferior wall attenuation artifact. No perfusion defects. No stress-induced ischemia.  Wall motion: Normal motion.  Ejection fraction: 63%. End-diastolic volume 017 cc. End systolic volume 47 cc.  IMPRESSION: Applying for a inferior wall attenuation artifact, there are no perfusion defects and there is no evidence of stress-induced ischemia.   Outpatient Encounter Prescriptions as of 01/21/2014  Medication Sig  . aspirin 325 MG tablet Take 325 mg by mouth  daily as needed for moderate pain.  Marland Kitchen DEXILANT 60 MG capsule Take 1 tablet by mouth daily.  . metoprolol tartrate (LOPRESSOR) 25 MG tablet Take 0.5 tablets (12.5 mg total) by mouth 2 (two) times daily.  . nitroGLYCERIN (NITROSTAT) 0.4 MG SL tablet Place 0.4 mg under the tongue every 5 (five) minutes as needed for chest pain.  . pantoprazole (PROTONIX) 40 MG tablet Take 40 mg by mouth daily.  . rosuvastatin (CRESTOR) 10 MG tablet Take 1 tablet (10 mg total) by mouth daily.    Allergies  Allergen Reactions  . Cephalexin Other (See Comments)    Nausea and stomach cramps  . Phenergan [Promethazine Hcl] Other (See Comments)    "restless legs"    Past Medical History  Diagnosis Date  . Coronary atherosclerosis of native coronary artery     a. 10/2008 inf STEMI/PCI: LM 50d (IVUS-borderline lesion->med rx), LAD min irregs, LCX 54m, 70d, OM nl, RCA 127m (3.5x28 Vision BMS);  b. 11/2008 Lexiscan MV: EF 65%, no isch/scar.  . Essential hypertension, benign   . Pure hypercholesterolemia   . AAA (abdominal aortic aneurysm)     a. 05/2013 CT: 5.8 cm AAA.  Marland Kitchen Diverticulitis     a. 05/2013 CT: descending/sigmoid jxn w/o abscess.  . Osteoarthritis   . Tobacco abuse     a. ongoing - 1ppd for better part of 50 yrs.  . Normocytic anemia   . Cellulitis 06/10/2013    Right antecubital fossa at site of IV  03/12/13  . PONV (postoperative nausea and vomiting)   . Diverticulitis   . GERD (gastroesophageal reflux disease)     family history includes Cancer in  his brother, father, and mother; Heart attack in his brother.   ROS: Negative except as per HPI  BP 140/72 mmHg  Pulse 55  Ht 5\' 10"  (1.778 m)  Wt 191 lb 6.4 oz (86.818 kg)  BMI 27.46 kg/m2  PHYSICAL EXAM: Pt is alert and oriented, NAD HEENT: normal Neck: JVP - normal, carotids 2+= without bruits Lungs: CTA bilaterally CV: RRR without murmur or gallop Abd: soft, NT, Positive BS, no hepatomegaly Ext: no C/C/E, distal pulses intact and  equal Skin: warm/dry no rash  ASSESSMENT AND PLAN: 1. Coronary artery disease, native vessel, with remote MI. The patient has residual left main and left circumflex stenoses and has done well with medical therapy. He had a nuclear stress scan prior to his surgery last year. He had no perioperative cardiac complications. The patient has no anginal symptoms at present will continue on his current medication regimen. He is on aspirin, beta blocker, and a statin drug. We discussed the importance of complete tobacco cessation today.  2. Essential hypertension. Blood pressure is controlled on low-dose metoprolol. I suspect weight loss has helped with his blood pressure control  3. Hyperlipidemia. His lipids have been extremely low when they were last checked. Will switch him to simvastatin 20 mg daily and repeat LFTs in 3 months.  For follow-up I will see him back in one year.  Sherren Mocha, MD 01/21/2014 11:30 AM

## 2014-01-21 NOTE — Patient Instructions (Signed)
Your physician has recommended you make the following change in your medication:  1) Stop crestor after you complete your current prescription, then 2) Start zocor (simvastatin) 20 mg once daily   Your physician recommends that you return for FASTING lab work in: 4 months- lipid/ liver  Your physician wants you to follow-up in: 1 year with Dr. Burt Knack. You will receive a reminder letter in the mail two months in advance. If you don't receive a letter, please call our office to schedule the follow-up appointment.

## 2014-01-26 ENCOUNTER — Encounter: Payer: Self-pay | Admitting: Surgery

## 2014-01-28 ENCOUNTER — Encounter: Payer: Self-pay | Admitting: Gastroenterology

## 2014-02-25 ENCOUNTER — Telehealth: Payer: Self-pay | Admitting: Cardiovascular Disease

## 2014-02-25 NOTE — Telephone Encounter (Signed)
   New EMssage     Pt wife calling to speak w/ Rn about medication. Pt wife says to use home # first then cell #. Please call back and discuss.

## 2014-02-25 NOTE — Telephone Encounter (Signed)
Spoke to patient's wife, Othniel Maret. She states that they went to get the generic version of Zocor but were concerned that it may not be as effective as Zocor and would like to know from Dr. Burt Knack what "his" experience has been with patients that change out from Zocor to simvastatin or other generic versions. Triage nurse provided general education regarding generic vs name brand medications but patient would like Dr. Antionette Char opinion, as well.  Thanks!

## 2014-02-25 NOTE — Telephone Encounter (Signed)
New Message  Pt wife calling to speak w/ Rna bout medication. Pt wife says to use home # first then cell #. Please call back and discuss.

## 2014-02-28 ENCOUNTER — Other Ambulatory Visit (HOSPITAL_COMMUNITY): Payer: Medicare Other

## 2014-02-28 ENCOUNTER — Ambulatory Visit: Payer: Medicare Other | Admitting: Surgery

## 2014-03-01 NOTE — Telephone Encounter (Signed)
I reviewed his medication list and think that generic simvastatin should be fine, thx.

## 2014-03-01 NOTE — Telephone Encounter (Signed)
I spoke with the pt and made him aware of Dr Antionette Char comments.

## 2014-03-29 ENCOUNTER — Telehealth: Payer: Self-pay | Admitting: Cardiovascular Disease

## 2014-03-29 ENCOUNTER — Other Ambulatory Visit: Payer: Self-pay

## 2014-03-29 MED ORDER — NITROGLYCERIN 0.4 MG SL SUBL: 0.4000 mg | SUBLINGUAL_TABLET | SUBLINGUAL | Status: AC | PRN

## 2014-03-29 NOTE — Telephone Encounter (Signed)
New message      Pt is having a lot of leg pain----more left than right.  No swelling in his ankles or feet.  Pt started celebrex a couple of weeks ago for neck pain.  Pt also had abdominal aneurysm bypass last year.  Please advise

## 2014-03-29 NOTE — Telephone Encounter (Signed)
I spoke with the pt and his wife on speaker phone.  She called the office because the pt is complaining of bilateral leg pain, left greater than right.  The pt describes this as a throbbing sensation and he notices this with walking and has to rest for symptoms to resolve.  This has been ongoing since September.  The pt did have a CTA performed in November by [facility] to evaluate this pain. The pt also complained of an episode of Chest pain yesterday with exertion.  The pt states he had to rest for about 15 minutes before symptoms resolved (the pt did not have NTG). I have scheduled the pt to see Dr Burt Knack on 03/30/14.

## 2014-03-30 ENCOUNTER — Other Ambulatory Visit: Payer: Self-pay

## 2014-03-30 ENCOUNTER — Telehealth: Payer: Self-pay

## 2014-03-30 ENCOUNTER — Emergency Department (HOSPITAL_COMMUNITY): Payer: Medicare Other

## 2014-03-30 ENCOUNTER — Ambulatory Visit (HOSPITAL_BASED_OUTPATIENT_CLINIC_OR_DEPARTMENT_OTHER): Payer: Medicare Other | Admitting: Cardiology

## 2014-03-30 ENCOUNTER — Encounter: Payer: Self-pay | Admitting: Cardiovascular Disease

## 2014-03-30 ENCOUNTER — Ambulatory Visit (INDEPENDENT_AMBULATORY_CARE_PROVIDER_SITE_OTHER): Payer: Medicare Other | Admitting: Cardiovascular Disease

## 2014-03-30 ENCOUNTER — Inpatient Hospital Stay (HOSPITAL_COMMUNITY): Payer: Medicare Other

## 2014-03-30 ENCOUNTER — Inpatient Hospital Stay (HOSPITAL_COMMUNITY)
Admission: EM | Admit: 2014-03-30 | Discharge: 2014-04-01 | DRG: 810 | Disposition: A | Discharge status: Home / Self Care | Payer: Medicare Other | Attending: Internal Medicine | Admitting: Internal Medicine

## 2014-03-30 ENCOUNTER — Encounter (HOSPITAL_COMMUNITY): Payer: Self-pay

## 2014-03-30 ENCOUNTER — Other Ambulatory Visit (HOSPITAL_COMMUNITY): Payer: Self-pay | Admitting: *Deleted

## 2014-03-30 VITALS — BP 142/76 | HR 55 | Ht 70.0 in | Wt 200.4 lb

## 2014-03-30 DIAGNOSIS — D61818 Other pancytopenia: Principal | ICD-10-CM | POA: Diagnosis present

## 2014-03-30 DIAGNOSIS — Z79899 Other long term (current) drug therapy: Secondary | ICD-10-CM

## 2014-03-30 DIAGNOSIS — I10 Essential (primary) hypertension: Secondary | ICD-10-CM | POA: Diagnosis not present

## 2014-03-30 DIAGNOSIS — Z955 Presence of coronary angioplasty implant and graft: Secondary | ICD-10-CM | POA: Diagnosis not present

## 2014-03-30 DIAGNOSIS — D649 Anemia, unspecified: Secondary | ICD-10-CM

## 2014-03-30 DIAGNOSIS — R0600 Dyspnea, unspecified: Secondary | ICD-10-CM | POA: Diagnosis not present

## 2014-03-30 DIAGNOSIS — Z7982 Long term (current) use of aspirin: Secondary | ICD-10-CM

## 2014-03-30 DIAGNOSIS — Z9842 Cataract extraction status, left eye: Secondary | ICD-10-CM

## 2014-03-30 DIAGNOSIS — I25118 Atherosclerotic heart disease of native coronary artery with other forms of angina pectoris: Secondary | ICD-10-CM | POA: Diagnosis not present

## 2014-03-30 DIAGNOSIS — M79605 Pain in left leg: Secondary | ICD-10-CM

## 2014-03-30 DIAGNOSIS — I2510 Atherosclerotic heart disease of native coronary artery without angina pectoris: Secondary | ICD-10-CM | POA: Diagnosis present

## 2014-03-30 DIAGNOSIS — Z981 Arthrodesis status: Secondary | ICD-10-CM | POA: Diagnosis not present

## 2014-03-30 DIAGNOSIS — E785 Hyperlipidemia, unspecified: Secondary | ICD-10-CM | POA: Diagnosis present

## 2014-03-30 DIAGNOSIS — R042 Hemoptysis: Secondary | ICD-10-CM | POA: Diagnosis present

## 2014-03-30 DIAGNOSIS — I739 Peripheral vascular disease, unspecified: Secondary | ICD-10-CM

## 2014-03-30 DIAGNOSIS — I70219 Atherosclerosis of native arteries of extremities with intermittent claudication, unspecified extremity: Secondary | ICD-10-CM

## 2014-03-30 DIAGNOSIS — M1990 Unspecified osteoarthritis, unspecified site: Secondary | ICD-10-CM | POA: Diagnosis present

## 2014-03-30 DIAGNOSIS — I252 Old myocardial infarction: Secondary | ICD-10-CM

## 2014-03-30 DIAGNOSIS — F17210 Nicotine dependence, cigarettes, uncomplicated: Secondary | ICD-10-CM | POA: Diagnosis present

## 2014-03-30 DIAGNOSIS — R079 Chest pain, unspecified: Secondary | ICD-10-CM | POA: Diagnosis not present

## 2014-03-30 DIAGNOSIS — Z720 Tobacco use: Secondary | ICD-10-CM | POA: Diagnosis not present

## 2014-03-30 DIAGNOSIS — K219 Gastro-esophageal reflux disease without esophagitis: Secondary | ICD-10-CM | POA: Diagnosis present

## 2014-03-30 DIAGNOSIS — M79604 Pain in right leg: Secondary | ICD-10-CM

## 2014-03-30 DIAGNOSIS — E7800 Pure hypercholesterolemia, unspecified: Secondary | ICD-10-CM

## 2014-03-30 DIAGNOSIS — I878 Other specified disorders of veins: Secondary | ICD-10-CM | POA: Diagnosis not present

## 2014-03-30 LAB — BASIC METABOLIC PANEL
Anion gap: 8 (ref 5–15)
BUN: 24 mg/dL — ABNORMAL HIGH (ref 6–23)
BUN: 20 mg/dL (ref 6–23)
CO2: 26 mEq/L (ref 19–32)
CO2: 23 mmol/L (ref 19–32)
CREATININE: 1.3 mg/dL (ref 0.50–1.35)
Calcium: 8.7 mg/dL (ref 8.4–10.5)
Calcium: 8.9 mg/dL (ref 8.4–10.5)
Chloride: 108 mEq/L (ref 96–112)
Chloride: 108 mmol/L (ref 96–112)
Creatinine, Ser: 1.37 mg/dL (ref 0.40–1.50)
GFR calc Af Amer: 64 mL/min — ABNORMAL LOW (ref >90)
GFR, EST NON AFRICAN AMERICAN: 55 mL/min — AB (ref >90)
GFR: 55.04 mL/min — AB (ref >60.00)
GLUCOSE: 102 mg/dL — AB (ref 70–99)
GLUCOSE: 82 mg/dL (ref 70–99)
Potassium: 4.3 mEq/L (ref 3.5–5.1)
Potassium: 3.8 mmol/L (ref 3.5–5.1)
SODIUM: 137 meq/L (ref 135–145)
SODIUM: 139 mmol/L (ref 135–145)

## 2014-03-30 LAB — CBC WITH DIFFERENTIAL/PLATELET
Basophils Absolute: 0 10*3/uL (ref 0.0–0.1)
Basophils Relative: 0 % (ref 0–1)
EOS PCT: 0 % (ref 0–5)
Eosinophils Absolute: 0 10*3/uL (ref 0.0–0.7)
HCT: 18.4 % — ABNORMAL LOW (ref 39.0–52.0)
Hemoglobin: 6.1 g/dL — CL (ref 13.0–17.0)
Lymphocytes Relative: 36 % (ref 12–46)
Lymphs Abs: 1.2 10*3/uL (ref 0.7–4.0)
MCH: 30 pg (ref 26.0–34.0)
MCHC: 33.2 g/dL (ref 30.0–36.0)
MCV: 90.6 fL (ref 78.0–100.0)
Monocytes Absolute: 0.3 10*3/uL (ref 0.1–1.0)
Monocytes Relative: 10 % (ref 3–12)
NEUTROS PCT: 54 % (ref 43–77)
Neutro Abs: 1.8 10*3/uL (ref 1.7–7.7)
PLATELETS: 68 10*3/uL — AB (ref 150–400)
RBC: 2.03 MIL/uL — ABNORMAL LOW (ref 4.22–5.81)
RDW: 22.1 % — ABNORMAL HIGH (ref 11.5–15.5)
WBC: 3.3 10*3/uL — ABNORMAL LOW (ref 4.0–10.5)

## 2014-03-30 LAB — CBC
MCHC: 33.5 g/dL (ref 30.0–36.0)
MCV: 91.4 fl (ref 78.0–100.0)
Platelets: 99 10*3/uL — ABNORMAL LOW (ref 150.0–400.0)
RBC: 2.11 Mil/uL — ABNORMAL LOW (ref 4.22–5.81)
RDW: 23.4 % — AB (ref 11.5–15.5)
WBC: 4.7 10*3/uL (ref 4.0–10.5)

## 2014-03-30 LAB — I-STAT TROPONIN, ED: TROPONIN I, POC: 0.01 ng/mL (ref 0.00–0.08)

## 2014-03-30 LAB — PREPARE RBC (CROSSMATCH)

## 2014-03-30 LAB — PROTIME-INR
INR: 1.3 ratio — AB (ref 0.8–1.0)
Prothrombin Time: 13.9 s — ABNORMAL HIGH (ref 9.6–13.1)

## 2014-03-30 LAB — POC OCCULT BLOOD, ED: FECAL OCCULT BLD: NEGATIVE

## 2014-03-30 MED ORDER — PANTOPRAZOLE SODIUM 40 MG PO TBEC
40.0000 mg | DELAYED_RELEASE_TABLET | Freq: Every day | ORAL | Status: DC
  Administered 2014-03-31 – 2014-04-01 (×2): 40 mg via ORAL
  Filled 2014-03-30 (×2): qty 1

## 2014-03-30 MED ORDER — SODIUM CHLORIDE 0.9 % IJ SOLN
3.0000 mL | Freq: Two times a day (BID) | INTRAMUSCULAR | Status: DC
  Administered 2014-03-31 – 2014-04-01 (×4): 3 mL via INTRAVENOUS

## 2014-03-30 MED ORDER — ASPIRIN EC 81 MG PO TBEC: 81.0000 mg | DELAYED_RELEASE_TABLET | Freq: Every day | ORAL | Status: DC

## 2014-03-30 MED ORDER — SIMVASTATIN 20 MG PO TABS
20.0000 mg | ORAL_TABLET | Freq: Every day | ORAL | Status: DC
  Administered 2014-03-31 (×2): 20 mg via ORAL
  Filled 2014-03-30 (×4): qty 1

## 2014-03-30 MED ORDER — IOHEXOL 300 MG/ML  SOLN
100.0000 mL | Freq: Once | INTRAMUSCULAR | Status: AC | PRN
  Administered 2014-03-30: 100 mL via INTRAVENOUS

## 2014-03-30 MED ORDER — SIMVASTATIN 20 MG PO TABS: 20.0000 mg | ORAL_TABLET | Freq: Every day | ORAL | Status: AC

## 2014-03-30 MED ORDER — SODIUM CHLORIDE 0.9 % IV SOLN
INTRAVENOUS | Status: DC
  Administered 2014-03-31: 03:00:00 via INTRAVENOUS

## 2014-03-30 MED ORDER — SODIUM CHLORIDE 0.9 % IV SOLN
Freq: Once | INTRAVENOUS | Status: AC
  Administered 2014-03-30: 21:00:00 via INTRAVENOUS

## 2014-03-30 MED ORDER — ASPIRIN EC 81 MG PO TBEC
81.0000 mg | DELAYED_RELEASE_TABLET | Freq: Every day | ORAL | Status: DC
  Administered 2014-03-31 – 2014-04-01 (×2): 81 mg via ORAL
  Filled 2014-03-30 (×2): qty 1

## 2014-03-30 MED ORDER — NITROGLYCERIN 0.4 MG SL SUBL: 0.4000 mg | SUBLINGUAL_TABLET | SUBLINGUAL | Status: DC | PRN

## 2014-03-30 MED ORDER — METOPROLOL TARTRATE 12.5 MG HALF TABLET
12.5000 mg | ORAL_TABLET | Freq: Two times a day (BID) | ORAL | Status: DC
  Administered 2014-03-31 – 2014-04-01 (×4): 12.5 mg via ORAL
  Filled 2014-03-30 (×5): qty 1

## 2014-03-30 NOTE — ED Provider Notes (Signed)
CSN: [id number]     Arrival date & time 03/30/14  1813 History   First MD Initiated Contact with Patient 03/30/14 1938     Chief Complaint  Patient presents with  . Abnormal Lab  . Weakness     (Consider location/radiation/quality/duration/timing/severity/associated sxs/prior Treatment) HPI   [name] is a 69 y.o. male who was seen today by his cardiologist to evaluate an episode of chest pain 2 days ago. Screening labs were done and hemoglobin returned very low, so he was sent here for evaluation. He reports having intermittent episodes of claudication in both legs, with exertion over the last several weeks. He also has cramping in his legs at night. He does not have persistent pain in his legs. He had an endoscopy done 5 months ago and polyps were removed. There were no malignancies discovered. He does not have known antral bleeding at this time. He denies fever, chills, nausea, vomiting, cough, shortness of breath, or other episodes of chest pain. His cardiologist had planned to do a catheterization next week, prior to return of labs. There are no other known modifying factors.    Past Medical History  Diagnosis Date  . Coronary atherosclerosis of native coronary artery     a. 10/2008 inf STEMI/PCI: LM 50d (IVUS-borderline lesion->med rx), LAD min irregs, LCX 61m, 70d, OM nl, RCA 123m (3.5x28 Vision BMS);  b. 11/2008 Lexiscan MV: EF 65%, no isch/scar.  . Essential hypertension, benign   . Pure hypercholesterolemia   . AAA (abdominal aortic aneurysm)     a. 05/2013 CT: 5.8 cm AAA.  Marland Kitchen Diverticulitis     a. 05/2013 CT: descending/sigmoid jxn w/o abscess.  . Osteoarthritis   . Tobacco abuse     a. ongoing - 1ppd for better part of 50 yrs.  . Normocytic anemia   . Cellulitis 06/10/2013    Right antecubital fossa at site of IV  03/12/13  . PONV (postoperative nausea and vomiting)   . Diverticulitis   . GERD (gastroesophageal reflux disease)    Past Surgical History  Procedure  Laterality Date  . Cardiac stents  2010  . Cholecystectomy N/A 06/19/2013    Procedure: LAPAROSCOPIC CHOLECYSTECTOMY;  Surgeon: Harl Bowie, MD;  Location: [facility];  Service: General;  Laterality: N/A;  . Abdominal aortic endovascular stent graft N/A 07/02/2013    Procedure: ABDOMINAL AORTIC ENDOVASCULAR STENT GRAFT;  Surgeon: Serafina Mitchell, MD;  Location: [facility] OR;  Service: Vascular;  Laterality: N/A;  . Esophagogastroduodenoscopy N/A 08/05/2013    Procedure: ESOPHAGOGASTRODUODENOSCOPY (EGD);  Surgeon: Lear Ng, MD;  Location: [facility] ENDOSCOPY;  Service: Endoscopy;  Laterality: N/A;  . Eye surgery Left     cataract surgery  . Back surgery      cervical fusion  . Colonoscopy with propofol N/A 10/15/2013    Procedure: COLONOSCOPY WITH PROPOFOL;  Surgeon: Lear Ng, MD;  Location: [place];  Service: Endoscopy;  Laterality: N/A;   Family History  Problem Relation Age of Onset  . Heart attack Brother     59s  . Cancer Father     Lung  . Cancer Mother     Brain  . Cancer Brother     Kidney   History  Substance Use Topics  . Smoking status: Current Every Day Smoker -- 1.00 packs/day for 50 years    Types: Cigarettes    Last Attempt to Quit: 06/05/2013  . Smokeless tobacco: Never Used  . Alcohol Use: No    Review  of Systems  All other systems reviewed and are negative.     Allergies  Cephalexin and Phenergan  Home Medications   Prior to Admission medications   Medication Sig Start Date End Date Taking? Authorizing Provider  acetaminophen (TYLENOL) 325 MG tablet Take 325 mg by mouth every 6 (six) hours as needed for moderate pain.   Yes Historical Provider, MD  aspirin EC 81 MG tablet Take 1 tablet (81 mg total) by mouth daily. 03/30/14  Yes Sherren Mocha, MD  celecoxib (CELEBREX) 200 MG capsule Take 200 mg by mouth daily.  03/25/14  Yes Historical Provider, MD  metoprolol tartrate (LOPRESSOR) 25 MG tablet Take 0.5 tablets (12.5 mg total) by mouth 2  (two) times daily. 08/03/13  Yes Sherren Mocha, MD  nitroGLYCERIN (NITROSTAT) 0.4 MG SL tablet Place 1 tablet (0.4 mg total) under the tongue every 5 (five) minutes as needed for chest pain. 03/29/14  Yes Sherren Mocha, MD  pantoprazole (PROTONIX) 40 MG tablet Take 40 mg by mouth daily. 08/06/13  Yes Nita Sells, MD  simvastatin (ZOCOR) 20 MG tablet Take 1 tablet (20 mg total) by mouth at bedtime. 03/30/14  Yes Sherren Mocha, MD   BP 134/74 mmHg  Pulse 57  Temp(Src) 98.2 F (36.8 C) (Oral)  Resp 14  SpO2 100% Physical Exam  Constitutional: He is oriented to person, place, and time. He appears well-developed.  Elderly, frail  HENT:  Head: Normocephalic and atraumatic.  Right Ear: External ear normal.  Left Ear: External ear normal.  Eyes: Conjunctivae and EOM are normal. Pupils are equal, round, and reactive to light.  Neck: Normal range of motion and phonation normal. Neck supple.  Cardiovascular: Normal rate, regular rhythm and normal heart sounds.   Pulmonary/Chest: Effort normal and breath sounds normal. He exhibits no bony tenderness.  Abdominal: Soft. He exhibits no distension and no mass. There is no tenderness. There is no guarding.  Genitourinary:  Normal penis. Small amount of brown blood in the rectum without evident blood or palpable hemorrhoids, internal.  Musculoskeletal: Normal range of motion.  Neurological: He is alert and oriented to person, place, and time. No cranial nerve deficit or sensory deficit. He exhibits normal muscle tone. Coordination normal.  Skin: Skin is warm, dry and intact.  Psychiatric: He has a normal mood and affect. His behavior is normal. Judgment and thought content normal.  Nursing note and vitals reviewed.   ED Course  Procedures (including critical care time)  Medications  0.9 %  sodium chloride infusion (not administered)    Patient Vitals for the past 24 hrs:  BP Temp Temp src Pulse Resp SpO2  03/30/14 1855 - - - (!) 57 14  100 %  03/30/14 1854 - - - 62 20 100 %  03/30/14 1853 134/74 mmHg - - - - -  03/30/14 1825 146/71 mmHg 98.2 F (36.8 C) Oral 60 18 99 %    The patient's wife was able to look at his patient Portal information and discern that he had a hemoglobin of 9.5, August 2015. No other levels have been taken since then.   8:17 PM-Consult complete with Dr. Ernestina Patches. Patient case explained and discussed. He agrees to admit patient for further evaluation and treatment. Call ended at [place] Reviewed  CBC WITH DIFFERENTIAL/PLATELET - Abnormal; Notable for the following:    WBC 3.3 (*)    RBC 2.03 (*)    Hemoglobin 6.1 (*)    HCT 18.4 (*)  RDW 22.1 (*)    Platelets 68 (*)    All other components within normal limits  BASIC METABOLIC PANEL - Abnormal; Notable for the following:    GFR calc non Af Amer 55 (*)    GFR calc Af Amer 64 (*)    All other components within normal limits  I-STAT TROPOININ, ED  POC OCCULT BLOOD, ED  TYPE AND SCREEN  PREPARE RBC (CROSSMATCH)    Imaging Review No results found.   EKG Interpretation  Date/Time:  Wednesday March 30 2014 18:23:05 EDT Ventricular Rate:  63 PR Interval:  184 QRS Duration: 92 QT Interval:  446 QTC Calculation: 456 R Axis:   75 Text Interpretation:  Normal sinus rhythm Normal ECG since last tracing no significant change Confirmed by Eulis Foster  MD, Vira Agar ([id number]) on 03/30/2014 8:20:19 PM            MDM   Final diagnoses:  Anemia, unspecified anemia type  Claudication  Chest pain, unspecified chest pain type    Anemia, source unknown. He does not have abdominal pain, or evident rectal bleeding currently. He has mild associated thrombocytopenia and leukopenia. This may indicate a production abnormality. He has chest discomfort and claudication syndrome. He will require admission for observation and blood transfusion.  Nursing Notes Reviewed/ Care Coordinated, and agree without changes. Applicable Imaging  Reviewed.  Interpretation of Laboratory Data incorporated into ED treatment  Plan: Admit    Daleen Bo, MD 03/30/14 2053

## 2014-03-30 NOTE — ED Notes (Signed)
Attempted report x1. 

## 2014-03-30 NOTE — ED Notes (Signed)
Attempted report x 2 

## 2014-03-30 NOTE — ED Notes (Signed)
Lab reported hgb 6.1.  MD notified.

## 2014-03-30 NOTE — Progress Notes (Signed)
Patient back to room, denies problems, SR up, call bell in reach. Will monitor.

## 2014-03-30 NOTE — Progress Notes (Signed)
ABI performed  

## 2014-03-30 NOTE — ED Notes (Addendum)
Pt went to doctor today and hgb was 6, pt has hx of abdominal aortic aneurysm bypass last year. Lips and oral mucosa pale. Pt has pain in left arm.  Pt had [place] on legs today and results were negative. Pt alert and oriented.  No weakness noted.

## 2014-03-30 NOTE — Patient Instructions (Signed)
Your physician has recommended you make the following change in your medication:  1. START Aspirin 81mg  take one by mouth daily  Your physician recommends that you have lab work today: BMP, CBC and PT/INR  Your physician has requested that you have a lower extremity arterial duplex. This test is an ultrasound of the arteries in the legs. It looks at arterial blood flow in the legs. Allow one hour for Lower Arterial scans. There are no restrictions or special instructions  Your physician has requested that you have a cardiac catheterization. Cardiac catheterization is used to diagnose and/or treat various heart conditions. Doctors may recommend this procedure for a number of different reasons. The most common reason is to evaluate chest pain. Chest pain can be a symptom of coronary artery disease (CAD), and cardiac catheterization can show whether plaque is narrowing or blocking your heart's arteries. This procedure is also used to evaluate the valves, as well as measure the blood flow and oxygen levels in different parts of your heart. For further information please visit [facility]. Please follow instruction sheet, as given.

## 2014-03-30 NOTE — Telephone Encounter (Signed)
error 

## 2014-03-30 NOTE — Telephone Encounter (Signed)
Pt called to say that he is now coughing and his mucus is pink tinged.  Pt just wanted Dr. Burt Knack to know.  Pt also wanted to know if he can wait and go to the ED tomorrow.  Pt encouraged to go to the ER now as he needs to be evaluated as soon as possible.  Pt wife agreed but pt was unsure.

## 2014-03-30 NOTE — Progress Notes (Signed)
Patient arrived to floor from ED via stretcher, ambulated from stretcher to bed, Blood infusing to 20G RAC without problems, patient denies any problems. Admission paperwork/assessment complete, denies pain or problems. Oriented to room, call bell, bed controls, orders and patient guide reviewed with verbal understanding. SR up, call bell in reach. Will monitor.

## 2014-03-30 NOTE — Progress Notes (Signed)
Cardiology Office Note   Date:  03/30/2014   ID:  [name], DOB May 14, 1945, MRN [id number]  PCP:  Shirline Frees, MD  Cardiologist:  Sherren Mocha, MD    Chief Complaint  Patient presents with  . Chest Pain     History of Present Illness: [name] is a 69 y.o. male who presents for coronary artery disease. He was last seen in January 2016. He initially presented with an inferior wall MI and 2010. The patient was treated with primary PCI of the right coronary artery. He has moderate distal left main stem stenosis and moderate left circumflex stenosis. He's been treated medically. Followup intravascular ultrasound was performed. The patient underwent a stress nuclear study that showed no significant ischemia. His LV function has been normal. In 2015 he underwent EVAR for a AAA by Dr Trula Slade. Follow-up CTA imaging has documented a good result.  He complains of 'leg problems' and called in earlier this week with those problems. He had also complained of chest discomfort so this appointment was arranged.   He complains of left > right leg pain. He describes calf pain with exertion, mostly on the left side. He also has 'Charlie horses' in the calves at night.   He had an episode of chest pain a few days ago while he was working. He felt pressure in the chest and left shoulder. Symptoms lasted about 10 minutes. He sat down to rest and symptoms resolved. He didn't have NTG with him. He has had chronic left shoulder problems and is unable to distinguish between cardiac pain and shoulder pain. No other associated symptoms.  Past Medical History  Diagnosis Date  . Coronary atherosclerosis of native coronary artery     a. 10/2008 inf STEMI/PCI: LM 50d (IVUS-borderline lesion->med rx), LAD min irregs, LCX 59m, 70d, OM nl, RCA 120m (3.5x28 Vision BMS);  b. 11/2008 Lexiscan MV: EF 65%, no isch/scar.  . Essential hypertension, benign   . Pure hypercholesterolemia   . AAA (abdominal  aortic aneurysm)     a. 05/2013 CT: 5.8 cm AAA.  Marland Kitchen Diverticulitis     a. 05/2013 CT: descending/sigmoid jxn w/o abscess.  . Osteoarthritis   . Tobacco abuse     a. ongoing - 1ppd for better part of 50 yrs.  . Normocytic anemia   . Cellulitis 06/10/2013    Right antecubital fossa at site of IV  03/12/13  . PONV (postoperative nausea and vomiting)   . Diverticulitis   . GERD (gastroesophageal reflux disease)     Past Surgical History  Procedure Laterality Date  . Cardiac stents  2010  . Cholecystectomy N/A 06/19/2013    Procedure: LAPAROSCOPIC CHOLECYSTECTOMY;  Surgeon: Harl Bowie, MD;  Location: [place];  Service: General;  Laterality: N/A;  . Abdominal aortic endovascular stent graft N/A 07/02/2013    Procedure: ABDOMINAL AORTIC ENDOVASCULAR STENT GRAFT;  Surgeon: Serafina Mitchell, MD;  Location: [facility] OR;  Service: Vascular;  Laterality: N/A;  . Esophagogastroduodenoscopy N/A 08/05/2013    Procedure: ESOPHAGOGASTRODUODENOSCOPY (EGD);  Surgeon: Lear Ng, MD;  Location: [facility] ENDOSCOPY;  Service: Endoscopy;  Laterality: N/A;  . Eye surgery Left     cataract surgery  . Back surgery      cervical fusion  . Colonoscopy with propofol N/A 10/15/2013    Procedure: COLONOSCOPY WITH PROPOFOL;  Surgeon: Lear Ng, MD;  Location: [place];  Service: Endoscopy;  Laterality: N/A;    No current facility-administered medications for this visit.  No current outpatient prescriptions on file.   Facility-Administered Medications Ordered in Other Visits  Medication Dose Route Frequency Provider Last Rate Last Dose  . 0.9 %  sodium chloride infusion   Intravenous Continuous Darrell Lever, MD      . aspirin EC tablet 81 mg  81 mg Oral Daily Darrell Lever, MD      . metoprolol tartrate (LOPRESSOR) tablet 12.5 mg  12.5 mg Oral BID Darrell Lever, MD      . nitroGLYCERIN (NITROSTAT) SL tablet 0.4 mg  0.4 mg Sublingual Q5 min PRN Darrell Lever, MD      . pantoprazole (PROTONIX)  EC tablet 40 mg  40 mg Oral Daily Darrell Lever, MD      . simvastatin (ZOCOR) tablet 20 mg  20 mg Oral QHS Darrell Lever, MD      . sodium chloride 0.9 % injection 3 mL  3 mL Intravenous Q12H Darrell Lever, MD        Allergies:   Cephalexin and Phenergan   Social History:  The patient  reports that he has been smoking Cigarettes.  He has a 50 pack-year smoking history. He has never used smokeless tobacco. He reports that he does not drink alcohol or use illicit drugs.   Family History:  The patient's family history includes Cancer in his brother, father, and mother; Heart attack in his brother.    ROS:  Please see the history of present illness.  Otherwise, review of systems is positive for chest pain, cough, muscle pain, easy bruising, fatigue, leg pain, and walking problems.  All other systems are reviewed and negative.    PHYSICAL EXAM: VS:  BP 142/76 mmHg  Pulse 55  Ht 5\' 10"  (1.778 m)  Wt 200 lb 6.4 oz (90.901 kg)  BMI 28.75 kg/m2 , BMI Body mass index is 28.75 kg/(m^2). GEN: Well nourished, well developed, pale appearing in no acute distress HEENT: normal Neck: no JVD, no masses. No carotid bruits Cardiac: RRR without murmur or gallop                Respiratory:  clear to auscultation bilaterally, normal work of breathing GI: soft, nontender, nondistended, + BS MS: no deformity or atrophy Ext: no pretibial edema, PT pulses 2+ right and 1+ left Skin: warm and dry, no rash Neuro:  Strength and sensation are intact Psych: euthymic mood, full affect  EKG:  EKG is ordered today. The ekg ordered today shows Sinus brady 55 bpm, otherwise within normal limits  Recent Labs: 06/05/2013: TSH 1.550 08/04/2013: Magnesium 2.1 08/05/2013: ALT 32 03/30/2014: BUN 20; Creatinine 1.30; Hemoglobin 6.1*; Platelets 68*; Potassium 3.8; Sodium 139   Lipid Panel     Component Value Date/Time   CHOL 81 05/25/2013 1140   TRIG 94.0 05/25/2013 1140   HDL 24.40* 05/25/2013 1140   CHOLHDL 3  05/25/2013 1140   VLDL 18.8 05/25/2013 1140   LDLCALC 38 05/25/2013 1140   LDLDIRECT 55.2 10/28/2012 1114      Wt Readings from Last 3 Encounters:  03/30/14 197 lb (89.359 kg)  03/30/14 200 lb 6.4 oz (90.901 kg)  01/21/14 191 lb 6.4 oz (86.818 kg)     Cardiac Studies Reviewed: Myoview scan 06/10/2013: FINDINGS: SPECT: Inferior wall attenuation artifact. No perfusion defects. No stress-induced ischemia.  Wall motion: Normal motion.  Ejection fraction: 63%. End-diastolic volume 794 cc. End systolic volume 47 cc.  IMPRESSION: Applying for a inferior wall attenuation artifact, there are no  perfusion defects and there is no evidence of stress-induced ischemia.  ASSESSMENT AND PLAN: 1.  Chest pain - concerns for progressive angina. Initial lengthy discussion about diagnostic options. He had a Myoview scan last year without ischemia. However, he has hx of moderate left main stenosis and hx of inferior wall infarction. With fatigue and chest pain, last cath in 2010, we initially planned on cardiac cath for definitive evaluation. Reviewed risks and indications in detail with the patient and his wife who was present today. However, pre-cath labs came back showing a HgB of 6.1 mg/dL. Patient was contacted and advised to go to ED for further evaluation of presumed symptomatic anemia. Will defer cath until anemia is worked up and treated.   2. HTN: controlled on beta-blocker.  3. AAA - s/p EVAR. followed by Dr Trula Slade.  4. Hyperlipidemia - treated with statin drug. Last lipids reviewed.  5. Tobacco - complete cessation advised.   6. Lower extremity claudication - recommend ABI/duplex eval with focus on left leg.   Current medicines are reviewed with the patient today.  The patient does not have concerns regarding medicines.  The following changes have been made:  no change  Labs/ tests ordered today include:   Orders Placed This Encounter  Procedures  . Basic Metabolic Panel  (BMET)  . CBC  . INR/PT  . EKG 12-Lead  . Lower Extremity Arterial Duplex Bilateral    Disposition:   FU post-hospitalization.  Signed, Sherren Mocha, MD  03/30/2014 11:30 PM    [address], [place], [place]  [address] Phone: [phone]; Fax: [phone]

## 2014-03-30 NOTE — H&P (Addendum)
Hospitalist Admission History and Physical  Patient name: [name] Medical record number: [id number] Date of birth: February 17, 1945 Age: 69 y.o. Gender: male  Primary Care Provider: Shirline Frees, MD  Chief Complaint: pancytopenia  History of Present Illness:This is a 69 y.o. year old male with significant past medical history of tobacco abuse, AAA s/p repair, diverticulosis, CAD s/p MI  presenting with pancytopenia. Pt reports 1-2 weeks of fatigue and leg weakness. Also w/ mild fatigue. States that fatigue has been present for 3-4 months. Mild intermittent CPs. Went to his cardiologists office today to discuss CP in setting of prior hx/o CAD s/p MI and stents. Tentative plan was for catheterization in near future per pt. Had blood work drawn w/ noted hgb 6. Pt reports one prior episode of anemia associated with AAA repair approximately one year ago. Denies any known history of anemia in the past though does report/admit to taking iron previously. Denies any night sweats. No fevers or chills. No nausea or vomiting. Had a colonoscopy with benign polyp removal in the past year per patient. No black or tarry stools. No NSAID use. Positive every day smoker. Had one questionable episode of hemoptysis recently. Is not sure this was from eating a piece of red candy or not. Minimal dyspnea. Presented to the ER afebrile, hemodynamically stable. Satting 99% on room air. White blood cell count 3.3, hemoglobin 6.1, platelet count 68. Creatinine 1.3, INR 1.3. Chest x-ray with mild vascular congestion and mild cardiomegaly. Pending PRBC transfusion. Hemoccult negative.  Assessment and Plan: [name] is a 69 y.o. year old male presenting with pancytopenia    Active Problems:   Pancytopenia   1- Pancytopenia -Unclear etiology at present -hemoccult negative -no overt signs of bleeding -anemia panel  -peripheral smear  -CT chest, abd, pelvis given smoking hx and ?trace hemoptysis-r/o  malignancy -pRBC transfusion  -serial CBCs  -follow -consider H-O consult in am   2- CAD -no active CP currently  -tele bed -cycle CEs given above -cont asa follow   FEN/GI: heart healthy diet  Prophylaxis: SCDs  Disposition: pending further evaluation  Code Status:Full Code    Patient Active Problem List   Diagnosis Date Noted  . Pancytopenia 03/30/2014  . Screening for colon cancer 10/15/2013  . Abdominal aneurysm without mention of rupture 08/23/2013  . Epigastric abdominal pain 08/05/2013  . Nausea & vomiting 08/04/2013  . Diverticulitis of colon without hemorrhage 07/14/2013  . AAA (abdominal aortic aneurysm) 07/02/2013  . Chronic cholecystitis 06/21/2013  . Symptomatic cholelithiasis 06/18/2013  . Malnutrition of moderate degree 06/15/2013  . PNA (pneumonia) 06/13/2013  . Diverticulitis 06/05/2013  . AAA (abdominal aortic aneurysm) without rupture 06/05/2013  . BRADYCARDIA 12/20/2008  . HYPERLIPIDEMIA 11/11/2008  . TOBACCO ABUSE 11/11/2008  . HYPERTENSION, UNSPECIFIED 11/11/2008  . MYOCARDIAL INFARCTION, HX OF 11/11/2008  . Coronary atherosclerosis of native coronary artery 11/11/2008   Past Medical History: Past Medical History  Diagnosis Date  . Coronary atherosclerosis of native coronary artery     a. 10/2008 inf STEMI/PCI: LM 50d (IVUS-borderline lesion->med rx), LAD min irregs, LCX 81m, 70d, OM nl, RCA 164m (3.5x28 Vision BMS);  b. 11/2008 Lexiscan MV: EF 65%, no isch/scar.  . Essential hypertension, benign   . Pure hypercholesterolemia   . AAA (abdominal aortic aneurysm)     a. 05/2013 CT: 5.8 cm AAA.  Marland Kitchen Diverticulitis     a. 05/2013 CT: descending/sigmoid jxn w/o abscess.  . Osteoarthritis   . Tobacco abuse  a. ongoing - 1ppd for better part of 50 yrs.  . Normocytic anemia   . Cellulitis 06/10/2013    Right antecubital fossa at site of IV  03/12/13  . PONV (postoperative nausea and vomiting)   . Diverticulitis   . GERD (gastroesophageal reflux  disease)     Past Surgical History: Past Surgical History  Procedure Laterality Date  . Cardiac stents  2010  . Cholecystectomy N/A 06/19/2013    Procedure: LAPAROSCOPIC CHOLECYSTECTOMY;  Surgeon: Harl Bowie, MD;  Location: [place];  Service: General;  Laterality: N/A;  . Abdominal aortic endovascular stent graft N/A 07/02/2013    Procedure: ABDOMINAL AORTIC ENDOVASCULAR STENT GRAFT;  Surgeon: Serafina Mitchell, MD;  Location: [facility] OR;  Service: Vascular;  Laterality: N/A;  . Esophagogastroduodenoscopy N/A 08/05/2013    Procedure: ESOPHAGOGASTRODUODENOSCOPY (EGD);  Surgeon: Lear Ng, MD;  Location: [facility] ENDOSCOPY;  Service: Endoscopy;  Laterality: N/A;  . Eye surgery Left     cataract surgery  . Back surgery      cervical fusion  . Colonoscopy with propofol N/A 10/15/2013    Procedure: COLONOSCOPY WITH PROPOFOL;  Surgeon: Lear Ng, MD;  Location: [place];  Service: Endoscopy;  Laterality: N/A;    Social History: History   Social History  . Marital Status: Married    Spouse Name: N/A  . Number of Children: N/A  . Years of Education: N/A   Social History Main Topics  . Smoking status: Current Every Day Smoker -- 1.00 packs/day for 50 years    Types: Cigarettes    Last Attempt to Quit: 06/05/2013  . Smokeless tobacco: Never Used  . Alcohol Use: No  . Drug Use: No  . Sexual Activity: Not on file   Other Topics Concern  . None   Social History Narrative   Lives in [place] with wife.  Retired Furniture conservator/restorer.  Works out in the yard often - limited by claudication.  Does not routinely exercise.    Family History: Family History  Problem Relation Age of Onset  . Heart attack Brother     55s  . Cancer Father     Lung  . Cancer Mother     Brain  . Cancer Brother     Kidney    Allergies: Allergies  Allergen Reactions  . Cephalexin Other (See Comments)    Nausea and stomach cramps  . Phenergan [Promethazine Hcl] Other (See Comments)    "restless legs"     Current Facility-Administered Medications  Medication Dose Route Frequency Provider Last Rate Last Dose  . 0.9 %  sodium chloride infusion   Intravenous Continuous Deneise Lever, MD      . sodium chloride 0.9 % injection 3 mL  3 mL Intravenous Q12H Deneise Lever, MD       Current Outpatient Prescriptions  Medication Sig Dispense Refill  . acetaminophen (TYLENOL) 325 MG tablet Take 325 mg by mouth every 6 (six) hours as needed for moderate pain.    Marland Kitchen aspirin EC 81 MG tablet Take 1 tablet (81 mg total) by mouth daily.    . celecoxib (CELEBREX) 200 MG capsule Take 200 mg by mouth daily.     . metoprolol tartrate (LOPRESSOR) 25 MG tablet Take 0.5 tablets (12.5 mg total) by mouth 2 (two) times daily. 90 tablet 3  . nitroGLYCERIN (NITROSTAT) 0.4 MG SL tablet Place 1 tablet (0.4 mg total) under the tongue every 5 (five) minutes as needed for chest pain. 25 tablet 6  .  pantoprazole (PROTONIX) 40 MG tablet Take 40 mg by mouth daily.    . simvastatin (ZOCOR) 20 MG tablet Take 1 tablet (20 mg total) by mouth at bedtime. 30 tablet 5   Review Of Systems: 12 point ROS negative except as noted above in HPI.  Physical Exam: Filed Vitals:   03/30/14 2050  BP: 125/65  Pulse: 57  Temp: 98 F (36.7 C)  Resp: 16    General: alert and cooperative HEENT: PERRLA and extra ocular movement intact Heart: S1, S2 normal, no murmur, rub or gallop, regular rate and rhythm Lungs: clear to auscultation, no wheezes or rales and unlabored breathing Abdomen: abdomen is soft without significant tenderness, masses, organomegaly or guarding Extremities: extremities normal, atraumatic, no cyanosis or edema Skin:no rashes, no ecchymoses Neurology: normal without focal findings  Labs and Imaging: Lab Results  Component Value Date/Time   NA 139 03/30/2014 06:28 PM   K 3.8 03/30/2014 06:28 PM   CL 108 03/30/2014 06:28 PM   CO2 23 03/30/2014 06:28 PM   BUN 20 03/30/2014 06:28 PM   CREATININE 1.30 03/30/2014  06:28 PM   CREATININE 1.24 11/18/2013 01:20 PM   GLUCOSE 82 03/30/2014 06:28 PM   Lab Results  Component Value Date   WBC 3.3* 03/30/2014   HGB 6.1* 03/30/2014   HCT 18.4* 03/30/2014   MCV 90.6 03/30/2014   PLT 68* 03/30/2014    Dg Chest Port 1 View  03/30/2014   CLINICAL DATA:  Acute onset of chest pain.  Initial encounter.  EXAM: PORTABLE CHEST - 1 VIEW  COMPARISON:  Chest radiograph performed 08/02/2013  FINDINGS: The lungs are well-aerated. Mild vascular congestion is noted. There is no evidence of focal opacification, pleural effusion or pneumothorax.  The cardiomediastinal silhouette is mildly enlarged. No acute osseous abnormalities are seen.  IMPRESSION: Mild vascular congestion and mild cardiomegaly; lungs remain grossly clear.   Electronically Signed   By: Garald Balding M.D.   On: 03/30/2014 20:28           Shanda Howells MD  Pager: [phone]

## 2014-03-30 NOTE — Progress Notes (Signed)
First unit of PRBC complete, patient tolerated well. Transport here to take patient to CT for CT abdomen and chest at this time.

## 2014-03-31 DIAGNOSIS — R0600 Dyspnea, unspecified: Secondary | ICD-10-CM

## 2014-03-31 DIAGNOSIS — I878 Other specified disorders of veins: Secondary | ICD-10-CM

## 2014-03-31 DIAGNOSIS — D61818 Other pancytopenia: Principal | ICD-10-CM

## 2014-03-31 DIAGNOSIS — R079 Chest pain, unspecified: Secondary | ICD-10-CM

## 2014-03-31 DIAGNOSIS — Z720 Tobacco use: Secondary | ICD-10-CM

## 2014-03-31 LAB — VITAMIN B12: VITAMIN B 12: 306 pg/mL (ref 211–911)

## 2014-03-31 LAB — CBC WITH DIFFERENTIAL/PLATELET
BASOS ABS: 0 10*3/uL (ref 0.0–0.1)
BASOS PCT: 0 % (ref 0–1)
Basophils Absolute: 0 10*3/uL (ref 0.0–0.1)
Basophils Relative: 0 % (ref 0–1)
EOS ABS: 0 10*3/uL (ref 0.0–0.7)
EOS PCT: 0 % (ref 0–5)
EOS PCT: 0 % (ref 0–5)
Eosinophils Absolute: 0 10*3/uL (ref 0.0–0.7)
HCT: 22.9 % — ABNORMAL LOW (ref 39.0–52.0)
HEMATOCRIT: 24.2 % — AB (ref 39.0–52.0)
Hemoglobin: 7.6 g/dL — ABNORMAL LOW (ref 13.0–17.0)
Hemoglobin: 8.3 g/dL — ABNORMAL LOW (ref 13.0–17.0)
LYMPHS PCT: 21 % (ref 12–46)
Lymphocytes Relative: 29 % (ref 12–46)
Lymphs Abs: 1 10*3/uL (ref 0.7–4.0)
Lymphs Abs: 0.8 10*3/uL (ref 0.7–4.0)
MCH: 29.5 pg (ref 26.0–34.0)
MCH: 30.1 pg (ref 26.0–34.0)
MCHC: 33.2 g/dL (ref 30.0–36.0)
MCHC: 34.3 g/dL (ref 30.0–36.0)
MCV: 88.8 fL (ref 78.0–100.0)
MCV: 87.7 fL (ref 78.0–100.0)
MONO ABS: 0.6 10*3/uL (ref 0.1–1.0)
Monocytes Absolute: 0.5 10*3/uL (ref 0.1–1.0)
Monocytes Relative: 14 % — ABNORMAL HIGH (ref 3–12)
Monocytes Relative: 18 % — ABNORMAL HIGH (ref 3–12)
NEUTROS PCT: 56 % (ref 43–77)
Neutro Abs: 2 10*3/uL (ref 1.7–7.7)
Neutro Abs: 2.2 10*3/uL (ref 1.7–7.7)
Neutrophils Relative %: 61 % (ref 43–77)
Platelets: 70 10*3/uL — ABNORMAL LOW (ref 150–400)
Platelets: 62 10*3/uL — ABNORMAL LOW (ref 150–400)
RBC: 2.58 MIL/uL — ABNORMAL LOW (ref 4.22–5.81)
RBC: 2.76 MIL/uL — AB (ref 4.22–5.81)
RDW: 20.8 % — ABNORMAL HIGH (ref 11.5–15.5)
RDW: 20.5 % — ABNORMAL HIGH (ref 11.5–15.5)
WBC: 3.5 10*3/uL — ABNORMAL LOW (ref 4.0–10.5)
WBC: 3.6 10*3/uL — AB (ref 4.0–10.5)

## 2014-03-31 LAB — IRON AND TIBC
Iron: 283 ug/dL — ABNORMAL HIGH (ref 42–165)
Saturation Ratios: 82 % — ABNORMAL HIGH (ref 20–55)
TIBC: 345 ug/dL (ref 215–435)
UIBC: 62 ug/dL — ABNORMAL LOW (ref 125–400)

## 2014-03-31 LAB — COMPREHENSIVE METABOLIC PANEL
ALBUMIN: 3.8 g/dL (ref 3.5–5.2)
ALT: 14 U/L (ref 0–53)
AST: 18 U/L (ref 0–37)
Alkaline Phosphatase: 85 U/L (ref 39–117)
Anion gap: 8 (ref 5–15)
BILIRUBIN TOTAL: 1.2 mg/dL (ref 0.3–1.2)
BUN: 16 mg/dL (ref 6–23)
CO2: 22 mmol/L (ref 19–32)
CREATININE: 1.21 mg/dL (ref 0.50–1.35)
Calcium: 8.7 mg/dL (ref 8.4–10.5)
Chloride: 108 mmol/L (ref 96–112)
GFR calc Af Amer: 70 mL/min — ABNORMAL LOW (ref >90)
GFR calc non Af Amer: 60 mL/min — ABNORMAL LOW (ref >90)
GLUCOSE: 93 mg/dL (ref 70–99)
Potassium: 4.1 mmol/L (ref 3.5–5.1)
Sodium: 138 mmol/L (ref 135–145)
Total Protein: 7.2 g/dL (ref 6.0–8.3)

## 2014-03-31 LAB — RETICULOCYTES
RBC.: 2.58 MIL/uL — AB (ref 4.22–5.81)
RBC.: 2.82 MIL/uL — AB (ref 4.22–5.81)
RETIC COUNT ABSOLUTE: 25.8 10*3/uL (ref 19.0–186.0)
RETIC COUNT ABSOLUTE: 28.2 10*3/uL (ref 19.0–186.0)
RETIC CT PCT: 1 % (ref 0.4–3.1)
RETIC CT PCT: 1 % (ref 0.4–3.1)

## 2014-03-31 LAB — FOLATE: Folate: 13 ng/mL

## 2014-03-31 LAB — SAVE SMEAR

## 2014-03-31 LAB — FERRITIN: FERRITIN: 207 ng/mL (ref 22–322)

## 2014-03-31 MED ORDER — ACETAMINOPHEN 325 MG PO TABS
325.0000 mg | ORAL_TABLET | ORAL | Status: DC | PRN
  Administered 2014-03-31 – 2014-04-01 (×2): 325 mg via ORAL
  Filled 2014-03-31 (×2): qty 1

## 2014-03-31 MED ORDER — OXYCODONE HCL 5 MG PO TABS
5.0000 mg | ORAL_TABLET | ORAL | Status: DC | PRN
  Administered 2014-03-31: 5 mg via ORAL
  Filled 2014-03-31: qty 1

## 2014-03-31 NOTE — Consult Note (Signed)
[facility]  [phone]   ONCOLOGY  HOSPITAL CONSULTATION NOTE  MALVERN KADLEC                                MR#: [id number]  DOB: [birth year]                       CSN#: [id number]  Referring MD: [facility]     Patient Care Team: Johny Blamer, MD as PCP - General (Family Medicine) Abigail Miyamoto, MD as Consulting Physician (General Surgery)  Reason for Consult: Pancytopenia   [name] is a68 y.o.male On 03/30/2014 from his cardiologist office, with a four-month history of increasing fatigue and leg weakness, worse for about 2 weeks prior to presentation. He reportedintermittent chest pain and exertional dyspnea as well. He denies any nausea or vomiting, or bloody diarrhea. He denied any mental status changes. A CBC was remarkable for a hemoglobin of 6, white blood cell of 3.3, and platelet count of 68,000. Because of these abnormal labs, he was instructed to be admitted for further evaluation, In the setting of significant cardiac history listed below.  Admission he received 2 units of blood with good response. Denies gum bleed, epistaxis, he reported recent hemoptysis. Denies easy bruising. Denies fevers,chills or night sweats. Denies weight loss or decreased appetite. He was on ASA, He was on Celebrex. No family history of hematological disorders, but does have a history of cancer in the family. The patient is a heavy smoker, but denies any history of alcohol or recreational drug use. Denies risk factors for HIV or hepatitis. Denies a history of sickle cell disease or trait . His last colonoscopy was in 10/15/2013, were several nonmalignant polyps were removed.  This is not a new issue, and has been seen in consultation back in 06/12/2013 By Dr. Cyndie Chime, and the patient had been admitted for intractable nausea and vomiting likely secondary to gastroenteritis. At the time, Dr. Cyndie Chime recommended lymph node sampling to help determine  normocytic anemia and splenomegaly noted in CT. Labs at the time were negative for hemolytic process, and had been diagnosed with normocytic anemia In addition, on a prior discharge summary on 08/06/2013, this issue was mentioned again, which had raised concerns for potential hematologic malignancy, but the patient failed to follow-up.  Reticulocyte count on 03/31/2014 was 28.2, Normal LDH at 213. New iron studies are pending. Hemoccult was negative. Peripheral blood smear was sent for review. Chest x-ray showed mild vascular congestion with mild cardiomegaly.  CT of the chest, abdomen and pelvis taken due to his history of smoking and a trace of hemoptysis, was remarkable for mediastinotomy and hilar adenopathy. Moderately prominent upper abdominal nodes were seen. These findings were suspicious for malignancy. In addition, a 2 cm indeterminate left adrenal nodule was noted.    PMH:  Past Medical History  Diagnosis Date  . Coronary atherosclerosis of native coronary artery     a. 10/2008 inf STEMI/PCI: LM 50d (IVUS-borderline lesion->med rx), LAD min irregs, LCX 58m, 70d, OM nl, RCA 146m (3.5x28 Vision BMS);  b. 11/2008 Lexiscan MV: EF 65%, no isch/scar.  . Essential hypertension, benign   . Pure hypercholesterolemia   . AAA (abdominal aortic aneurysm)     a. 05/2013 CT: 5.8 cm AAA.  Marland Kitchen Diverticulitis     a. 05/2013 CT: descending/sigmoid jxn w/o abscess.  . Osteoarthritis   . Tobacco abuse  a. ongoing - 1ppd for better part of 50 yrs.  . Normocytic anemia   . Cellulitis 06/10/2013    Right antecubital fossa at site of IV  03/12/13  . PONV (postoperative nausea and vomiting)   . Diverticulitis   . GERD (gastroesophageal reflux disease)     Surgeries:  Past Surgical History  Procedure Laterality Date  . Cardiac stents  2010  . Cholecystectomy N/A 06/19/2013    Procedure: LAPAROSCOPIC CHOLECYSTECTOMY;  Surgeon: Harl Bowie, MD;  Location: [place];  Service: General;  Laterality:  N/A;  . Abdominal aortic endovascular stent graft N/A 07/02/2013    Procedure: ABDOMINAL AORTIC ENDOVASCULAR STENT GRAFT;  Surgeon: Serafina Mitchell, MD;  Location: [facility] OR;  Service: Vascular;  Laterality: N/A;  . Esophagogastroduodenoscopy N/A 08/05/2013    Procedure: ESOPHAGOGASTRODUODENOSCOPY (EGD);  Surgeon: Lear Ng, MD;  Location: [facility] END[facility]COPY;  Service: Endoscopy;  Laterality: N/A;  . Eye surgery Left     cataract surgery  . Back surgery      cervical fusion  . Colonoscopy with propofol N/A 10/15/2013    Procedure: COLONOSCOPY WITH PROPOFOL;  Surgeon: Lear Ng, MD;  Location: [place];  Service: Endoscopy;  Laterality: N/A;    Allergies:  Allergies  Allergen Reactions  . Cephalexin Other (See Comments)    Nausea and stomach cramps  . Phenergan [Promethazine Hcl] Other (See Comments)    "restless legs"    Medications:   Prior to Admission:  Prescriptions prior to admission  Medication Sig Dispense Refill Last Dose  . acetaminophen (TYLENOL) 325 MG tablet Take 325 mg by mouth every 6 (six) hours as needed for moderate pain.   03/30/2014 at Unknown time  . aspirin EC 81 MG tablet Take 1 tablet (81 mg total) by mouth daily.   stopped  . celecoxib (CELEBREX) 200 MG capsule Take 200 mg by mouth daily.    stopped  . metoprolol tartrate (LOPRESSOR) 25 MG tablet Take 0.5 tablets (12.5 mg total) by mouth 2 (two) times daily. 90 tablet 3 03/30/2014 at 1000  . nitroGLYCERIN (NITROSTAT) 0.4 MG SL tablet Place 1 tablet (0.4 mg total) under the tongue every 5 (five) minutes as needed for chest pain. 25 tablet 6 stopped  . pantoprazole (PROTONIX) 40 MG tablet Take 40 mg by mouth daily.   stopped  . simvastatin (ZOCOR) 20 MG tablet Take 1 tablet (20 mg total) by mouth at bedtime. 30 tablet 5 stopped    Scheduled Meds: . aspirin EC  81 mg Oral Daily  . metoprolol tartrate  12.5 mg Oral BID  . pantoprazole  40 mg Oral Daily  . simvastatin  20 mg Oral QHS  . sodium  chloride  3 mL Intravenous Q12H   Continuous Infusions: . sodium chloride 125 mL/hr at 03/31/14 0329   PRN Meds:.nitroGLYCERIN  ROS: See history of present illness for significant positives, and the rest of the review of systems is negative.   Family History:    Family History  Problem Relation Age of Onset  . Heart attack Brother     106s  . Cancer Father     Lung  . Cancer Mother     Brain  . Cancer Brother     Kidney    Social History:  reports that he has been smoking Cigarettes.  He has a 50 pack-year smoking history. He has never used smokeless tobacco. He reports that he does not drink alcohol or use illicit drugs. He is married, one  son, lives in [place],. Retired Furniture conservator/restorer  Physical Exam    Filed Vitals:   03/31/14 0510  BP: 114/57  Pulse: 54  Temp: 97.8 F (36.6 C)  Resp: 18   Filed Weights   03/30/14 2143  Weight: 197 lb (89.359 kg)    GENERAL:alert, no distress and comfortable SKIN: skin color, texture, turgor are normal, no rashes or significant lesions EYES: normal, conjunctiva are pink and non-injected, sclera clear OROPHARYNX:no exudate, no erythema and lips, buccal mucosa, and tongue normal  NECK: supple, thyroid normal size, non-tender, without nodularity LYMPH:  no palpable lymphadenopathy in the cervical, axillary or inguinal area LUNGS: clear to auscultation and percussion with normal breathing effort HEART: regular rate & rhythm and no murmurs and no lower extremity edema ABDOMEN  soft, non-tender and normal bowel sounds Musculoskeletal:no cyanosis of digits and no clubbing  PSYCH: alert & oriented x 3 with fluent speech NEURO: no focal motor/sensory deficits  Labs:    Recent Labs Lab 03/30/14 1242 03/30/14 1828 03/31/14 0702 03/31/14 0907  WBC 4.7 3.3* 3.5* 3.6*  HGB 6.4 Repeated and verified X2.* 6.1* 7.6* 8.3*  HCT 19.1 Repeated and verified X2.* 18.4* 22.9* 24.2*  PLT 99.0 Repeated and verified X2.* 68* 70* 62*  MCV  91.4 90.6 88.8 87.7  MCH  --  30.0 29.5 30.1  MCHC 33.5 33.2 33.2 34.3  RDW 23.4* 22.1* 20.8* 20.5*  LYMPHSABS  --  1.2 1.0 0.8  MONOABS  --  0.3 0.5 0.6  EOSABS  --  0.0 0.0 0.0  BASOSABS  --  0.0 0.0 0.0       Recent Labs Lab 03/30/14 1242 03/30/14 1828 03/31/14 0702  NA 137 139 138  K 4.3 3.8 4.1  CL 108 108 108  CO2 $Re'26 23 22  'OgV$ GLUCOSE 102* 82 93  BUN 24* 20 16  CREATININE 1.37 1.30 1.21  CALCIUM 8.7 8.9 8.7  AST  --   --  18  ALT  --   --  14  ALKPHOS  --   --  85  BILITOT  --   --  1.2        Component Value Date/Time   BILITOT 1.2 03/31/2014 0702   BILIDIR <0.2 06/18/2013 0630   IBILI NOT CALCULATED 06/18/2013 0630      Recent Labs Lab 03/30/14 1242  INR 1.3*    No results for input(s): DDIMER in the last 72 hours.   Anemia panel:   Recent Labs  03/31/14 0702 03/31/14 0907  RETICCTPCT 1.0 1.0    Urinalysis    Component Value Date/Time   COLORURINE YELLOW 08/02/2013 2347   APPEARANCEUR CLEAR 08/02/2013 2347   LABSPEC 1.010 08/02/2013 2347   PHURINE 6.5 08/02/2013 2347   GLUCOSEU NEGATIVE 08/02/2013 2347   HGBUR NEGATIVE 08/02/2013 2347   BILIRUBINUR NEGATIVE 08/02/2013 2347   KETONESUR NEGATIVE 08/02/2013 2347   PROTEINUR NEGATIVE 08/02/2013 2347   UROBILINOGEN 0.2 08/02/2013 2347   NITRITE NEGATIVE 08/02/2013 2347   LEUKOCYTESUR NEGATIVE 08/02/2013 2347    Drugs of Abuse     Component Value Date/Time   LABOPIA NONE DETECTED 06/05/2013 1724   COCAINSCRNUR NONE DETECTED 06/05/2013 1724   LABBENZ NONE DETECTED 06/05/2013 1724   AMPHETMU NONE DETECTED 06/05/2013 1724   THCU NONE DETECTED 06/05/2013 1724   LABBARB NONE DETECTED 06/05/2013 1724      Imaging Studies:  Ct Chest W Contrast  03/30/2014   CLINICAL DATA:  Anemia.  Left arm pain.  EXAM: CT  CHEST, ABDOMEN, AND PELVIS WITH CONTRAST  TECHNIQUE: Multidetector CT imaging of the chest, abdomen and pelvis was performed following the standard protocol during bolus  administration of intravenous contrast.  CONTRAST:  156mL OMNIPAQUE IOHEXOL 300 MG/ML  SOLN  COMPARISON:  Chest radiograph 03/30/2014, CT 07/07/2013  FINDINGS: CT CHEST FINDINGS  There are multiple abnormal mediastinal nodes as well as smaller hilar nodes. The mediastinal nodes measure up to 1.4 cm. No axillary adenopathy is evident. Lung parenchyma is clear. There are apical blebs bilaterally. Central airways are patent. There are no effusions.  CT ABDOMEN AND PELVIS FINDINGS  There are moderately prominent upper abdominal nodes in the gastrohepatic ligament and portacaval regions, measuring up to 1.4 cm. No periaortic or iliac chain adenopathy is evident. No significant mesenteric adenopathy is evident. There are normal appearances of the liver, spleen and pancreas. There is a 2.0 cm indeterminate left adrenal nodule. There are a few benign renal cysts measuring up to 2.0 cm. Mesentery and bowel appear unremarkable except for extensive colonic diverticulosis. No acute inflammatory changes are evident in the abdomen or pelvis. There is no ascites.  There is endoluminal stent graft repair of abdominal aortic aneurysm with intact appearances.  No skeletal lesions are evident.  IMPRESSION: 1. Mediastinal and hilar adenopathy. There also are moderately prominent upper abdominal nodes. This could represent lymphoma or other neoplastic process. Infectious or inflammatory entities are less likely. 2. 2.0 cm indeterminate left adrenal nodule. 3. Diverticulosis 4. Intact appearances of the abdominal aortic aneurysm endoluminal stent graft   Electronically Signed   By: Andreas Newport M.D.   On: 03/30/2014 23:29   Ct Abdomen Pelvis W Contrast  03/30/2014   CLINICAL DATA:  Anemia.  Left arm pain.  EXAM: CT CHEST, ABDOMEN, AND PELVIS WITH CONTRAST  TECHNIQUE: Multidetector CT imaging of the chest, abdomen and pelvis was performed following the standard protocol during bolus administration of intravenous contrast.   CONTRAST:  118mL OMNIPAQUE IOHEXOL 300 MG/ML  SOLN  COMPARISON:  Chest radiograph 03/30/2014, CT 07/07/2013  FINDINGS: CT CHEST FINDINGS  There are multiple abnormal mediastinal nodes as well as smaller hilar nodes. The mediastinal nodes measure up to 1.4 cm. No axillary adenopathy is evident. Lung parenchyma is clear. There are apical blebs bilaterally. Central airways are patent. There are no effusions.  CT ABDOMEN AND PELVIS FINDINGS  There are moderately prominent upper abdominal nodes in the gastrohepatic ligament and portacaval regions, measuring up to 1.4 cm. No periaortic or iliac chain adenopathy is evident. No significant mesenteric adenopathy is evident. There are normal appearances of the liver, spleen and pancreas. There is a 2.0 cm indeterminate left adrenal nodule. There are a few benign renal cysts measuring up to 2.0 cm. Mesentery and bowel appear unremarkable except for extensive colonic diverticulosis. No acute inflammatory changes are evident in the abdomen or pelvis. There is no ascites.  There is endoluminal stent graft repair of abdominal aortic aneurysm with intact appearances.  No skeletal lesions are evident.  IMPRESSION: 1. Mediastinal and hilar adenopathy. There also are moderately prominent upper abdominal nodes. This could represent lymphoma or other neoplastic process. Infectious or inflammatory entities are less likely. 2. 2.0 cm indeterminate left adrenal nodule. 3. Diverticulosis 4. Intact appearances of the abdominal aortic aneurysm endoluminal stent graft   Electronically Signed   By: Andreas Newport M.D.   On: 03/30/2014 23:29   Dg Chest Port 1 View  03/30/2014   CLINICAL DATA:  Acute onset of chest pain.  Initial encounter.  EXAM: PORTABLE CHEST - 1 VIEW  COMPARISON:  Chest radiograph performed 08/02/2013  FINDINGS: The lungs are well-aerated. Mild vascular congestion is noted. There is no evidence of focal opacification, pleural effusion or pneumothorax.  The  cardiomediastinal silhouette is mildly enlarged. No acute osseous abnormalities are seen.  IMPRESSION: Mild vascular congestion and mild cardiomegaly; lungs remain grossly clear.   Electronically Signed   By: Garald Balding M.D.   On: 03/30/2014 20:28    Assessment and Plan 69 y.o. Pancytopenia Etiology is currently unknown. This has been worked up in the past, with recommendations to follow with hematology as outpatient, but that the patient failed to do so. This could be related to malignancy  No acute bleeding issues other than mild hemoptysis are noted.  His most recent CT of the abdomen and pelvis on admission, do not show any aneurysmal leakage but lymphadenopathy id noted. His hemoglobin was 6.4 dropping to 6.1 on admission, requiring 2 units of blood with good response, today at 8.3. A peripheral blood smear has been ordered for review Agree with iron studies.  May need a bone marrow biopsy pending on the smear results versus lymph node biopsy. Will consider SPEP with IFE.   Chest pain Due to his cardiac history, he was to undergo a cardiac catheterization, which has been placed on hold, due to low hemoglobin requiring transfusion with good results. His hemoglobin is 8.3. The patient can undergo Catheterization if platelets are above 70,000. If urgent, platelet transfusion to increase that value will be recommended, but if no catheterization is planned with observed this platelets only, and transfuse to maintain platelet count above 10,000, or 20,000 if bleeding Appreciate cardiac evaluation and follow-up  History of tobacco abuse Tobacco cessation recommended  History of lower extremity claudication Dopplers are pending  DVT prophylaxis On aspirin and SCDs.   Full Code   Other medical issues as per admitting team    **Disclaimer: This note was dictated with voice recognition software. Similar sounding words can inadvertently be transcribed and this note may contain  transcription errors which may not have been corrected upon publication of note.Sharene Butters E, PA-C 03/31/2014 12:44 PM  ADDENDUM:  I saw and examined the patient. I still have to look at the blood smear. I cannot find anything on his exam that looked suspicious. The CT scan certainly is not impressive from my point of view.  His labs so far are pretty nondescript.  I would suspect that a bone marrow biopsy might give [place] an answer. Hopefully, this can be done tomorrow. Of note, his reticulocyte count is incredibly low. This certainly could indicate a myelodysplastic process. He definitely does not have hemolytic anemia.  His B-12 is okay. His iron studies are okay. I will see what his LDH is.  I spent about an hour with he and his wife. They're both very very nice.  He has not had any moles removed. He does not have any obvious occupational exposures. He does smoke. He probably has about a 40-pack-year history of tobacco use. He used to work as a Furniture conservator/restorer.  I think that if he needed a lymph node biopsy in the future, I will think that a mediastinoscopy might be what is necessary in order to get adequate tissue.  We will follow along. If he has his bone marrow biopsy tomorrow, then he can be discharged over the weekend from my point of view and we can follow-up as an outpatient.  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  [name] E.

## 2014-03-31 NOTE — Progress Notes (Signed)
MD Elgergawy paged to put in orders for pain medication, no pain medication ordered at this time.

## 2014-03-31 NOTE — Progress Notes (Signed)
2nd unit of PRBC complete, patient tolerated well. Will monitor

## 2014-03-31 NOTE — Progress Notes (Signed)
Patient Demographics  [name], is a 69 y.o. male, DOB - 11-19-1945, EKC:[id number]  Admit date - 03/30/2014   Admitting Physician Deneise Lever, MD  Outpatient Primary MD for the patient is Shirline Frees, MD  LOS - 1   Chief Complaint  Patient presents with  . Abnormal Lab  . Weakness      Admission history of present illness/brief narrative:  History of Present Illness:This is a 69 y.o. year old male with past medical history of tobacco abuse, AAA s/p repair, diverticulosis, CAD s/p MI presenting with pancytopenia. Pt reports 1-2 weeks of fatigue and leg weakness.. Mild intermittent CPs. Went to his cardiologists office today to discuss CP in setting of prior hx/o CAD s/p MI and stents. Tentative plan was for catheterization in near future per pt. Had blood work drawn w/ noted hgb 6. Pt reports one prior episode of anemia associated with AAA repair approximately one year ago. Denies any known history of anemia in the past though does report/admit to taking iron previously. Denies any night sweats. No fevers or chills. No nausea or vomiting. Had a colonoscopy with benign polyp removal in the past year per patient. No black or tarry stools. No NSAID use. Positive every day smoker. Had one questionable episode of hemoptysis recently. Is not sure this was from eating a piece of red candy or not. Minimal dyspnea. Presented to the ER afebrile, hemodynamically stable. Satting 99% on room air. White blood cell count 3.3, hemoglobin 6.1, platelet count 68. Creatinine 1.3, INR 1.3. Chest x-ray with mild vascular congestion and mild cardiomegaly.   Subjective:   [name] today has, No headache, No chest pain, No abdominal pain - No Nausea, No new weakness tingling or numbness, No Cough - SOB.   Assessment & Plan    Active Problems:   Pancytopenia  Pancytopenia -  etiology is unclear, CT chest abdomen and pelvis showing evidence of hilar and mediastinal lymphadenopathy, and prominent upper abdomen lymph nodes , requested oncology consult for further evaluation. - Patient transfused 2 units packed red blood cell, anemia is improving, hemoglobin is 8.3 posttransfusion. - Platelet count is 62,000, no evidence of active bleed, no indication for transfusion. SCDs for DVT prophylaxis  Coronary artery disease with chest pain - Patient chest pain most likely exacerbated by his anemia. - Continue with aspirin, beta blockers, and statin.  Hyperlipidemia - Continue with statin  Tobacco abuse - Tobacco cessation recommended  Code Status: Full code  Family Communication: Discussed with wife over the phone  Disposition Plan: Home once stable   Procedures  None   Consults   Hematology oncology   Medications  Scheduled Meds: . aspirin EC  81 mg Oral Daily  . metoprolol tartrate  12.5 mg Oral BID  . pantoprazole  40 mg Oral Daily  . simvastatin  20 mg Oral QHS  . sodium chloride  3 mL Intravenous Q12H   Continuous Infusions: . sodium chloride 125 mL/hr at 03/31/14 0329   PRN Meds:.nitroGLYCERIN  DVT Prophylaxis  SCDs  Lab Results  Component Value Date   PLT 62* 03/31/2014    Antibiotics    Anti-infectives    None          Objective:  Filed Vitals:   03/31/14 0021 03/31/14 0310 03/31/14 0510 03/31/14 1344  BP: 130/69 123/66 114/57 121/66  Pulse: 55 53 54 54  Temp: 98.3 F (36.8 C) 98.2 F (36.8 C) 97.8 F (36.6 C) 98.3 F (36.8 C)  TempSrc: Oral Oral Oral Oral  Resp: 16 16 18 14   Height:      Weight:      SpO2: 98% 99% 98% 99%    Wt Readings from Last 3 Encounters:  03/30/14 89.359 kg (197 lb)  03/30/14 90.901 kg (200 lb 6.4 oz)  01/21/14 86.818 kg (191 lb 6.4 oz)     Intake/Output Summary (Last 24 hours) at 03/31/14 1610 Last data filed at 03/31/14 1546  Gross per 24 hour  Intake 1110.19 ml  Output    2300 ml  Net -1189.81 ml     Physical Exam  Awake Alert, Oriented X 3, No new F.N deficits, Normal affect [name],PERRAL Supple Neck,No JVD, No cervical lymphadenopathy appriciated.  Symmetrical Chest wall movement, Good air movement bilaterally, CTAB RRR,No Gallops,Rubs or new Murmurs, No Parasternal Heave +ve B.Sounds, Abd Soft, No tenderness, No organomegaly appriciated, No rebound - guarding or rigidity. No Cyanosis, Clubbing or edema, No new Rash or bruise     Data Review   Micro Results No results found for this or any previous visit (from the past 240 hour(s)).  Radiology Reports Ct Chest W Contrast  03/30/2014   CLINICAL DATA:  Anemia.  Left arm pain.  EXAM: CT CHEST, ABDOMEN, AND PELVIS WITH CONTRAST  TECHNIQUE: Multidetector CT imaging of the chest, abdomen and pelvis was performed following the standard protocol during bolus administration of intravenous contrast.  CONTRAST:  126mL OMNIPAQUE IOHEXOL 300 MG/ML  SOLN  COMPARISON:  Chest radiograph 03/30/2014, CT 07/07/2013  FINDINGS: CT CHEST FINDINGS  There are multiple abnormal mediastinal nodes as well as smaller hilar nodes. The mediastinal nodes measure up to 1.4 cm. No axillary adenopathy is evident. Lung parenchyma is clear. There are apical blebs bilaterally. Central airways are patent. There are no effusions.  CT ABDOMEN AND PELVIS FINDINGS  There are moderately prominent upper abdominal nodes in the gastrohepatic ligament and portacaval regions, measuring up to 1.4 cm. No periaortic or iliac chain adenopathy is evident. No significant mesenteric adenopathy is evident. There are normal appearances of the liver, spleen and pancreas. There is a 2.0 cm indeterminate left adrenal nodule. There are a few benign renal cysts measuring up to 2.0 cm. Mesentery and bowel appear unremarkable except for extensive colonic diverticulosis. No acute inflammatory changes are evident in the abdomen or pelvis. There is no ascites.  There is  endoluminal stent graft repair of abdominal aortic aneurysm with intact appearances.  No skeletal lesions are evident.  IMPRESSION: 1. Mediastinal and hilar adenopathy. There also are moderately prominent upper abdominal nodes. This could represent lymphoma or other neoplastic process. Infectious or inflammatory entities are less likely. 2. 2.0 cm indeterminate left adrenal nodule. 3. Diverticulosis 4. Intact appearances of the abdominal aortic aneurysm endoluminal stent graft   Electronically Signed   By: Andreas Newport M.D.   On: 03/30/2014 23:29   Ct Abdomen Pelvis W Contrast  03/30/2014   CLINICAL DATA:  Anemia.  Left arm pain.  EXAM: CT CHEST, ABDOMEN, AND PELVIS WITH CONTRAST  TECHNIQUE: Multidetector CT imaging of the chest, abdomen and pelvis was performed following the standard protocol during bolus administration of intravenous contrast.  CONTRAST:  180mL OMNIPAQUE IOHEXOL 300 MG/ML  SOLN  COMPARISON:  Chest radiograph  03/30/2014, CT 07/07/2013  FINDINGS: CT CHEST FINDINGS  There are multiple abnormal mediastinal nodes as well as smaller hilar nodes. The mediastinal nodes measure up to 1.4 cm. No axillary adenopathy is evident. Lung parenchyma is clear. There are apical blebs bilaterally. Central airways are patent. There are no effusions.  CT ABDOMEN AND PELVIS FINDINGS  There are moderately prominent upper abdominal nodes in the gastrohepatic ligament and portacaval regions, measuring up to 1.4 cm. No periaortic or iliac chain adenopathy is evident. No significant mesenteric adenopathy is evident. There are normal appearances of the liver, spleen and pancreas. There is a 2.0 cm indeterminate left adrenal nodule. There are a few benign renal cysts measuring up to 2.0 cm. Mesentery and bowel appear unremarkable except for extensive colonic diverticulosis. No acute inflammatory changes are evident in the abdomen or pelvis. There is no ascites.  There is endoluminal stent graft repair of abdominal  aortic aneurysm with intact appearances.  No skeletal lesions are evident.  IMPRESSION: 1. Mediastinal and hilar adenopathy. There also are moderately prominent upper abdominal nodes. This could represent lymphoma or other neoplastic process. Infectious or inflammatory entities are less likely. 2. 2.0 cm indeterminate left adrenal nodule. 3. Diverticulosis 4. Intact appearances of the abdominal aortic aneurysm endoluminal stent graft   Electronically Signed   By: Andreas Newport M.D.   On: 03/30/2014 23:29   Dg Chest Port 1 View  03/30/2014   CLINICAL DATA:  Acute onset of chest pain.  Initial encounter.  EXAM: PORTABLE CHEST - 1 VIEW  COMPARISON:  Chest radiograph performed 08/02/2013  FINDINGS: The lungs are well-aerated. Mild vascular congestion is noted. There is no evidence of focal opacification, pleural effusion or pneumothorax.  The cardiomediastinal silhouette is mildly enlarged. No acute osseous abnormalities are seen.  IMPRESSION: Mild vascular congestion and mild cardiomegaly; lungs remain grossly clear.   Electronically Signed   By: Garald Balding M.D.   On: 03/30/2014 20:28    CBC  Recent Labs Lab 03/30/14 1242 03/30/14 1828 03/31/14 0702 03/31/14 0907  WBC 4.7 3.3* 3.5* 3.6*  HGB 6.4 Repeated and verified X2.* 6.1* 7.6* 8.3*  HCT 19.1 Repeated and verified X2.* 18.4* 22.9* 24.2*  PLT 99.0 Repeated and verified X2.* 68* 70* 62*  MCV 91.4 90.6 88.8 87.7  MCH  --  30.0 29.5 30.1  MCHC 33.5 33.2 33.2 34.3  RDW 23.4* 22.1* 20.8* 20.5*  LYMPHSABS  --  1.2 1.0 0.8  MONOABS  --  0.3 0.5 0.6  EOSABS  --  0.0 0.0 0.0  BASOSABS  --  0.0 0.0 0.0    Chemistries   Recent Labs Lab 03/30/14 1242 03/30/14 1828 03/31/14 0702  NA 137 139 138  K 4.3 3.8 4.1  CL 108 108 108  CO2 26 23 22   GLUCOSE 102* 82 93  BUN 24* 20 16  CREATININE 1.37 1.30 1.21  CALCIUM 8.7 8.9 8.7  AST  --   --  18  ALT  --   --  14  ALKPHOS  --   --  85  BILITOT  --   --  1.2    ------------------------------------------------------------------------------------------------------------------ estimated creatinine clearance is 66.7 mL/min (by C-G formula based on Cr of 1.21). ------------------------------------------------------------------------------------------------------------------ No results for input(s): HGBA1C in the last 72 hours. ------------------------------------------------------------------------------------------------------------------ No results for input(s): CHOL, HDL, LDLCALC, TRIG, CHOLHDL, LDLDIRECT in the last 72 hours. ------------------------------------------------------------------------------------------------------------------ No results for input(s): TSH, T4TOTAL, T3FREE, THYROIDAB in the last 72 hours.  Invalid input(s): FREET3 ------------------------------------------------------------------------------------------------------------------  Recent  Labs  03/31/14 0702 03/31/14 0907  VITAMINB12 306  --   FOLATE 13.0  --   FERRITIN 207  --   TIBC 345  --   IRON 283*  --   RETICCTPCT 1.0 1.0    Coagulation profile  Recent Labs Lab 03/30/14 1242  INR 1.3*    No results for input(s): DDIMER in the last 72 hours.  Cardiac Enzymes No results for input(s): CKMB, TROPONINI, MYOGLOBIN in the last 168 hours.  Invalid input(s): CK ------------------------------------------------------------------------------------------------------------------ Invalid input(s): POCBNP     Time Spent in minutes   35 minutes   Tanasia Budzinski M.D on 03/31/2014 at 4:10 PM  Between 7am to 7pm - Pager - [phone]  After 7pm go to [email or web address] - password [email or web address]  And look for the night coverage person covering for me after hours  [facility] Office  [phone]   **Disclaimer: This note may have been dictated with voice recognition software. Similar sounding words can inadvertently be transcribed and this note may  contain transcription errors which may not have been corrected upon publication of note.**

## 2014-03-31 NOTE — Progress Notes (Signed)
  Echocardiogram 2D Echocardiogram has been performed.  Diamond Nickel 03/31/2014, 4:25 PM

## 2014-03-31 NOTE — Progress Notes (Signed)
2nd unit PRBC infusing per orders. Patient instructed on s/s to report with verbal understanding. Blood infusing without problems. RN stayed at bedside x 15 minutes, patient denies problems, will monitor.

## 2014-04-01 ENCOUNTER — Inpatient Hospital Stay (HOSPITAL_COMMUNITY): Payer: Medicare Other

## 2014-04-01 HISTORY — PX: BONE MARROW BIOPSY: SHX199

## 2014-04-01 LAB — LACTATE DEHYDROGENASE: LDH: 211 U/L (ref 94–250)

## 2014-04-01 LAB — COMPREHENSIVE METABOLIC PANEL
ALT: 14 U/L (ref 0–53)
AST: 19 U/L (ref 0–37)
Albumin: 3.8 g/dL (ref 3.5–5.2)
Alkaline Phosphatase: 89 U/L (ref 39–117)
Anion gap: 6 (ref 5–15)
BUN: 16 mg/dL (ref 6–23)
CALCIUM: 8.7 mg/dL (ref 8.4–10.5)
CO2: 25 mmol/L (ref 19–32)
CREATININE: 1.29 mg/dL (ref 0.50–1.35)
Chloride: 108 mmol/L (ref 96–112)
GFR, EST AFRICAN AMERICAN: 65 mL/min — AB (ref >90)
GFR, EST NON AFRICAN AMERICAN: 56 mL/min — AB (ref >90)
Glucose, Bld: 89 mg/dL (ref 70–99)
Potassium: 4 mmol/L (ref 3.5–5.1)
Sodium: 139 mmol/L (ref 135–145)
Total Bilirubin: 0.9 mg/dL (ref 0.3–1.2)
Total Protein: 6.9 g/dL (ref 6.0–8.3)

## 2014-04-01 LAB — CBC
HEMATOCRIT: 23.5 % — AB (ref 39.0–52.0)
HEMOGLOBIN: 7.8 g/dL — AB (ref 13.0–17.0)
MCH: 29.3 pg (ref 26.0–34.0)
MCHC: 33.2 g/dL (ref 30.0–36.0)
MCV: 88.3 fL (ref 78.0–100.0)
Platelets: 60 10*3/uL — ABNORMAL LOW (ref 150–400)
RBC: 2.66 MIL/uL — ABNORMAL LOW (ref 4.22–5.81)
RDW: 20.4 % — AB (ref 11.5–15.5)
WBC: 3.6 10*3/uL — AB (ref 4.0–10.5)

## 2014-04-01 LAB — HEMOGLOBIN AND HEMATOCRIT, BLOOD
HCT: 26.3 % — ABNORMAL LOW (ref 39.0–52.0)
Hemoglobin: 8.9 g/dL — ABNORMAL LOW (ref 13.0–17.0)

## 2014-04-01 LAB — PREPARE RBC (CROSSMATCH)

## 2014-04-01 LAB — BONE MARROW EXAM

## 2014-04-01 MED ORDER — MIDAZOLAM HCL 2 MG/2ML IJ SOLN
INTRAMUSCULAR | Status: AC
  Filled 2014-04-01: qty 4

## 2014-04-01 MED ORDER — FENTANYL CITRATE 0.05 MG/ML IJ SOLN
INTRAMUSCULAR | Status: AC
  Filled 2014-04-01: qty 4

## 2014-04-01 MED ORDER — HYDROCODONE-ACETAMINOPHEN 5-325 MG PO TABS: 1.0000 | ORAL_TABLET | Freq: Four times a day (QID) | ORAL | Status: DC | PRN

## 2014-04-01 MED ORDER — SODIUM CHLORIDE 0.9 % IV SOLN
Freq: Once | INTRAVENOUS | Status: AC
  Administered 2014-04-01: 13:00:00 via INTRAVENOUS

## 2014-04-01 MED ORDER — MIDAZOLAM HCL 2 MG/2ML IJ SOLN
INTRAMUSCULAR | Status: AC | PRN
  Administered 2014-04-01: 0.5 mg via INTRAVENOUS
  Administered 2014-04-01 (×2): 1 mg via INTRAVENOUS

## 2014-04-01 MED ORDER — LIDOCAINE HCL 1 % IJ SOLN
INTRAMUSCULAR | Status: AC
  Filled 2014-04-01: qty 20

## 2014-04-01 MED ORDER — FENTANYL CITRATE 0.05 MG/ML IJ SOLN
INTRAMUSCULAR | Status: AC | PRN
  Administered 2014-04-01: 50 ug via INTRAVENOUS
  Administered 2014-04-01: 25 ug via INTRAVENOUS
  Administered 2014-04-01: 50 ug via INTRAVENOUS

## 2014-04-01 MED ORDER — SODIUM CHLORIDE 0.9 % IV SOLN
INTRAVENOUS | Status: AC | PRN
  Administered 2014-04-01: 50 mL/h via INTRAVENOUS

## 2014-04-01 MED ORDER — HYDROCODONE-ACETAMINOPHEN 5-325 MG PO TABS: 1.0000 | ORAL_TABLET | ORAL | Status: DC | PRN

## 2014-04-01 NOTE — Procedures (Signed)
Interventional Radiology Procedure Note  Procedure: CT guided aspirate and core biopsy of right iliac bone Complications: None EBL: < 25 mL Recommendations: - Bedrest supine x 3 hrs - Hydrocodone PRN  Pain - Follow biopsy results  Signed,  Criselda Peaches, MD

## 2014-04-01 NOTE — Sedation Documentation (Signed)
Patient denies pain and is resting comfortably.  

## 2014-04-01 NOTE — Discharge Instructions (Signed)
Follow with Primary MD Shirline Frees, MD in 7 days   Get CBC, CMP,  checked  by Primary MD next visit.    Activity: As tolerated with Full fall precautions use walker/cane & assistance as needed   Disposition Home    Diet: Heart Healthy  , with feeding assistance and aspiration precautions as needed.  For Heart failure patients - Check your Weight same time everyday, if you gain over 2 pounds, or you develop in leg swelling, experience more shortness of breath or chest pain, call your Primary MD immediately. Follow Cardiac Low Salt Diet and 1.5 lit/day fluid restriction.   On your next visit with your primary care physician please Get Medicines reviewed and adjusted.   Please request your Prim.MD to go over all Hospital Tests and Procedure/Radiological results at the follow up, please get all Hospital records sent to your Prim MD by signing hospital release before you go home.   If you experience worsening of your admission symptoms, develop shortness of breath, life threatening emergency, suicidal or homicidal thoughts you must seek medical attention immediately by calling 911 or calling your MD immediately  if symptoms less severe.  You Must read complete instructions/literature along with all the possible adverse reactions/side effects for all the Medicines you take and that have been prescribed to you. Take any new Medicines after you have completely understood and accpet all the possible adverse reactions/side effects.   Do not drive, operating heavy machinery, perform activities at heights, swimming or participation in water activities or provide baby sitting services if your were admitted for syncope or siezures until you have seen by Primary MD or a Neurologist and advised to do so again.  Do not drive when taking Pain medications.    Do not take more than prescribed Pain, Sleep and Anxiety Medications  Special Instructions: If you have smoked or chewed Tobacco  in the last  2 yrs please stop smoking, stop any regular Alcohol  and or any Recreational drug use.  Wear Seat belts while driving.   Please note  You were cared for by a hospitalist during your hospital stay. If you have any questions about your discharge medications or the care you received while you were in the hospital after you are discharged, you can call the unit and asked to speak with the hospitalist on call if the hospitalist that took care of you is not available. Once you are discharged, your primary care physician will handle any further medical issues. Please note that NO REFILLS for any discharge medications will be authorized once you are discharged, as it is imperative that you return to your primary care physician (or establish a relationship with a primary care physician if you do not have one) for your aftercare needs so that they can reassess your need for medications and monitor your lab values.

## 2014-04-01 NOTE — Consult Note (Signed)
Chief Complaint: Chief Complaint  Patient presents with  . Abnormal Lab  . Weakness    Referring Physician(s): [facility]/ Dr Marin Olp   History of Present Illness: [name] is a 69 y.o. male  Weakness and fatigue worsening especially in last few weeks Was seen in cardiology office for routine follow up' Blood work revealed anemia Admitted and work up shows pancytopenia CT with hilar and mediastinal lymphadenopathy Request for Bone Marrow biopsy per Dr Marin Olp   Past Medical History  Diagnosis Date  . Coronary atherosclerosis of native coronary artery     a. 10/2008 inf STEMI/PCI: LM 50d (IVUS-borderline lesion->med rx), LAD min irregs, LCX 39m 70d, OM nl, RCA [id number] Vision BMS);  b. 11/2008 Lexiscan MV: EF 65%, no isch/scar.  . Essential hypertension, benign   . Pure hypercholesterolemia   . AAA (abdominal aortic aneurysm)     a. 05/2013 CT: 5.8 cm AAA.  . Marland Kitcheniverticulitis     a. 05/2013 CT: descending/sigmoid jxn w/o abscess.  . Osteoarthritis   . Tobacco abuse     a. ongoing - 1ppd for better part of 50 yrs.  . Normocytic anemia   . Cellulitis 06/10/2013    Right antecubital fossa at site of IV  03/12/13  . PONV (postoperative nausea and vomiting)   . Diverticulitis   . GERD (gastroesophageal reflux disease)     Past Surgical History  Procedure Laterality Date  . Cardiac stents  2010  . Cholecystectomy N/A 06/19/2013    Procedure: LAPAROSCOPIC CHOLECYSTECTOMY;  Surgeon: DoHarl BowieMD;  [facility]: [facility];  Laterality: N/A;  . Abdominal aortic endovascular stent graft N/A 07/02/2013    Procedure: ABDOMINAL AORTIC ENDOVASCULAR STENT GRAFT;  Surgeon: VaSerafina MitchellMD;  [facility];  Service: Vascular;  Laterality: N/A;  . Esophagogastroduodenoscopy N/A 08/05/2013    Procedure: ESOPHAGOGASTRODUODENOSCOPY (EGD);  Surgeon: ViLear NgMD;  [facility];  Service: Endoscopy;  Laterality: N/A;  . Eye surgery Left    cataract surgery  . Back surgery      cervical fusion  . Colonoscopy with propofol N/A 10/15/2013    Procedure: COLONOSCOPY WITH PROPOFOL;  Surgeon: ViLear NgMD;  [facility] Service: End[facility];  Laterality: N/A;    Allergies: Cephalexin and Phenergan  Medications: Prior to Admission medications   Medication Sig Start Date End Date Taking? Authorizing Provider  acetaminophen (TYLENOL) 325 MG tablet Take 325 mg by mouth every 6 (six) hours as needed for moderate pain.   Yes Historical Provider, MD  aspirin EC 81 MG tablet Take 1 tablet (81 mg total) by mouth daily. 03/30/14  Yes MiSherren MochaMD  celecoxib (CELEBREX) 200 MG capsule Take 200 mg by mouth daily.  03/25/14  Yes Historical Provider, MD  metoprolol tartrate (LOPRESSOR) 25 MG tablet Take 0.5 tablets (12.5 mg total) by mouth 2 (two) times daily. 08/03/13  Yes MiSherren MochaMD  nitroGLYCERIN (NITROSTAT) 0.4 MG SL tablet Place 1 tablet (0.4 mg total) under the tongue every 5 (five) minutes as needed for chest pain. 03/29/14  Yes MiSherren MochaMD  pantoprazole (PROTONIX) 40 MG tablet Take 40 mg by mouth daily. 08/06/13  Yes JaNita SellsMD  simvastatin (ZOCOR) 20 MG tablet Take 1 tablet (20 mg total) by mouth at bedtime. 03/30/14  Yes MiSherren MochaMD     Family History  Problem Relation Age of Onset  . Heart attack Brother     6035s.  Cancer Father     Lung  . Cancer Mother     Brain  . Cancer Brother     Kidney    History   Social History  . Marital Status: Married    Spouse Name: N/A  . Number of Children: N/A  . Years of Education: N/A   Social History Main Topics  . Smoking status: Current Every Day Smoker -- 1.00 packs/day for 50 years    Types: Cigarettes    Last Attempt to Quit: 06/05/2013  . Smokeless tobacco: Never Used  . Alcohol Use: No  . Drug Use: No  . Sexual Activity: Not on file   Other Topics Concern  . None   Social History Narrative   Lives in [place] with  wife.  Retired machinist.  Works out in the yard often - limited by claudication.  Does not routinely exercise.     Review of Systems: A 12 point ROS discussed and pertinent positives are indicated in the HPI above.  All other systems are negative.  Review of Systems  Constitutional: Positive for activity change and fatigue. Negative for fever, appetite change and unexpected weight change.  Respiratory: Negative for cough and shortness of breath.   Cardiovascular: Negative for chest pain.  Gastrointestinal: Negative for abdominal pain.  Genitourinary: Negative for difficulty urinating.  Neurological: Positive for weakness.  Psychiatric/Behavioral: Negative for behavioral problems and confusion.    Vital Signs: BP 119/62 mmHg  Pulse 56  Temp(Src) 98.5 F (36.9 C) (Oral)  Resp 18  Ht 5' 10" (1.778 m)  Wt 89.359 kg (197 lb)  BMI 28.27 kg/m2  SpO2 98%  Physical Exam  Constitutional: He is oriented to person, place, and time. He appears well-nourished.  Cardiovascular: Normal rate, regular rhythm and normal heart sounds.   No murmur heard. Pulmonary/Chest: Effort normal and breath sounds normal.  Abdominal: Soft. Bowel sounds are normal. There is no tenderness.  Musculoskeletal: Normal range of motion.  Neurological: He is alert and oriented to person, place, and time.  Skin: Skin is warm and dry.  Psychiatric: He has a normal mood and affect. His behavior is normal. Judgment and thought content normal.  Nursing note and vitals reviewed.   Mallampati Score:  MD Evaluation Airway: WNL Heart: WNL Abdomen: WNL Chest/ Lungs: WNL ASA  Classification: 2 Mallampati/Airway Score: Two  Imaging: Ct Chest W Contrast  03/30/2014   CLINICAL DATA:  Anemia.  Left arm pain.  EXAM: CT CHEST, ABDOMEN, AND PELVIS WITH CONTRAST  TECHNIQUE: Multidetector CT imaging of the chest, abdomen and pelvis was performed following the standard protocol during bolus administration of intravenous  contrast.  CONTRAST:  100mL OMNIPAQUE IOHEXOL 300 MG/ML  SOLN  COMPARISON:  Chest radiograph 03/30/2014, CT 07/07/2013  FINDINGS: CT CHEST FINDINGS  There are multiple abnormal mediastinal nodes as well as smaller hilar nodes. The mediastinal nodes measure up to 1.4 cm. No axillary adenopathy is evident. Lung parenchyma is clear. There are apical blebs bilaterally. Central airways are patent. There are no effusions.  CT ABDOMEN AND PELVIS FINDINGS  There are moderately prominent upper abdominal nodes in the gastrohepatic ligament and portacaval regions, measuring up to 1.4 cm. No periaortic or iliac chain adenopathy is evident. No significant mesenteric adenopathy is evident. There are normal appearances of the liver, spleen and pancreas. There is a 2.0 cm indeterminate left adrenal nodule. There are a few benign renal cysts measuring up to 2.0 cm. Mesentery and bowel appear unremarkable except for extensive colonic diverticulosis.   No acute inflammatory changes are evident in the abdomen or pelvis. There is no ascites.  There is endoluminal stent graft repair of abdominal aortic aneurysm with intact appearances.  No skeletal lesions are evident.  IMPRESSION: 1. Mediastinal and hilar adenopathy. There also are moderately prominent upper abdominal nodes. This could represent lymphoma or other neoplastic process. Infectious or inflammatory entities are less likely. 2. 2.0 cm indeterminate left adrenal nodule. 3. Diverticulosis 4. Intact appearances of the abdominal aortic aneurysm endoluminal stent graft   Electronically Signed   By: Andreas Newport M.D.   On: 03/30/2014 23:29   Ct Abdomen Pelvis W Contrast  03/30/2014   CLINICAL DATA:  Anemia.  Left arm pain.  EXAM: CT CHEST, ABDOMEN, AND PELVIS WITH CONTRAST  TECHNIQUE: Multidetector CT imaging of the chest, abdomen and pelvis was performed following the standard protocol during bolus administration of intravenous contrast.  CONTRAST:  114m OMNIPAQUE IOHEXOL  300 MG/ML  SOLN  COMPARISON:  Chest radiograph 03/30/2014, CT 07/07/2013  FINDINGS: CT CHEST FINDINGS  There are multiple abnormal mediastinal nodes as well as smaller hilar nodes. The mediastinal nodes measure up to 1.4 cm. No axillary adenopathy is evident. Lung parenchyma is clear. There are apical blebs bilaterally. Central airways are patent. There are no effusions.  CT ABDOMEN AND PELVIS FINDINGS  There are moderately prominent upper abdominal nodes in the gastrohepatic ligament and portacaval regions, measuring up to 1.4 cm. No periaortic or iliac chain adenopathy is evident. No significant mesenteric adenopathy is evident. There are normal appearances of the liver, spleen and pancreas. There is a 2.0 cm indeterminate left adrenal nodule. There are a few benign renal cysts measuring up to 2.0 cm. Mesentery and bowel appear unremarkable except for extensive colonic diverticulosis. No acute inflammatory changes are evident in the abdomen or pelvis. There is no ascites.  There is endoluminal stent graft repair of abdominal aortic aneurysm with intact appearances.  No skeletal lesions are evident.  IMPRESSION: 1. Mediastinal and hilar adenopathy. There also are moderately prominent upper abdominal nodes. This could represent lymphoma or other neoplastic process. Infectious or inflammatory entities are less likely. 2. 2.0 cm indeterminate left adrenal nodule. 3. Diverticulosis 4. Intact appearances of the abdominal aortic aneurysm endoluminal stent graft   Electronically Signed   By: DAndreas NewportM.D.   On: 03/30/2014 23:29   Dg Chest Port 1 View  03/30/2014   CLINICAL DATA:  Acute onset of chest pain.  Initial encounter.  EXAM: PORTABLE CHEST - 1 VIEW  COMPARISON:  Chest radiograph performed 08/02/2013  FINDINGS: The lungs are well-aerated. Mild vascular congestion is noted. There is no evidence of focal opacification, pleural effusion or pneumothorax.  The cardiomediastinal silhouette is mildly enlarged.  No acute osseous abnormalities are seen.  IMPRESSION: Mild vascular congestion and mild cardiomegaly; lungs remain grossly clear.   Electronically Signed   By: JGarald BaldingM.D.   On: 03/30/2014 20:28    Labs:  CBC:  Recent Labs  03/30/14 1242 03/30/14 1828 03/31/14 0702 03/31/14 0907  WBC 4.7 3.3* 3.5* 3.6*  HGB 6.4 Repeated and verified X2.* 6.1* 7.6* 8.3*  HCT 19.1 Repeated and verified X2.* 18.4* 22.9* 24.2*  PLT 99.0 Repeated and verified X2.* 68* 70* 62*    COAGS:  Recent Labs  07/02/13 0551 07/02/13 1230 08/05/13 0405 03/30/14 1242  INR 1.20 1.34 1.31 1.3*  APTT 40* 37  --   --     BMP:  Recent Labs  08/04/13 0455 08/05/13 0405 11/18/13  1320 03/30/14 1242 03/30/14 1828 03/31/14 0702  NA 141 143  --  137 139 138  K 3.6* 3.5*  --  4.3 3.8 4.1  CL 101 106  --  108 108 108  CO2 21 23  --  26 23 22  GLUCOSE 142* 100*  --  102* 82 93  BUN 23 23 25* 24* 20 16  CALCIUM 9.3 8.5  --  8.7 8.9 8.7  CREATININE 1.18 1.12 1.24 1.37 1.30 1.21  GFRNONAA 63* 67*  --   --  55* 60*  GFRAA 72* 77*  --   --  64* 70*    LIVER FUNCTION TESTS:  Recent Labs  08/02/13 1955 08/04/13 0455 08/05/13 0405 03/31/14 0702  BILITOT 0.6 0.7 0.6 1.2  AST 15 20 41* 18  ALT 11 15 32 14  ALKPHOS 93 99 90 85  PROT 7.6 8.2 7.0 7.2  ALBUMIN 3.7 4.0 3.5 3.8    TUMOR MARKERS: No results for input(s): AFPTM, CEA, CA199, CHROMGRNA in the last 8760 hours.  Assessment and Plan:  Pancytopenia CT with hilar and mediastinal LAN BM Bx request per [facility] scheduled for same Risks and Benefits discussed with the patient including, but not limited to bleeding, infection, damage to adjacent structures or low yield requiring additional tests. All of the patient's questions were answered, patient is agreeable to proceed. Consent signed and in chart.    Thank you for this interesting consult.  I greatly enjoyed meeting Saban E Tirey and look forward to participating in their  care.  Signed: , A 04/01/2014, 7:49 AM   I spent a total of 20 Minutes  in face to face in clinical consultation, greater than 50% of which was counseling/coordinating care for BM bx        

## 2014-04-01 NOTE — Progress Notes (Signed)
I looked at his blood smear last night. I really do not see anything that looked suspicious. He did have some poikilocytosis. I do not see any nucleated red cells. He may have had a few teardrop cells. There were no inclusion bodies. White cells. Normal in morphology maturation. I do not see any atypical-appearing lymphocytes. Platelets were decreased in number. He had a few large platelets. They were well granulated.  We will have to see what the bone marrow biopsy shows.  I think if the bone marrow biopsy is unremarkable, then I would recommend a mediastinoscopy to obtain adequate tissue from one of the mediastinal or hilar lymph nodes.  There are no lab results back yet.  Again, if he has a bone marrow biopsy done today, a feels well, then he can have his workup done as an outpatient.  Laurey Arrow   Romans 8:14

## 2014-04-01 NOTE — Discharge Summary (Signed)
[name], 69 y.o., DOB 08/22/1945, MRN [id number]. Admission date: 03/30/2014 Discharge Date 04/01/2014 Primary MD Shirline Frees, MD Admitting Physician Deneise Lever, MD   PCP please follow on: - Please check CBC, BMP during next visit, as patient is having pancytopenia. - Patient will need to follow with oncology Dr. Marin Olp regarding results of bone marrow biopsy.  Admission Diagnosis  Claudication [I73.9] Hemoptysis [R04.2] Chest pain, unspecified chest pain type [R07.9] Anemia, unspecified anemia type [D64.9]  Discharge Diagnosis   Active Problems:   Pancytopenia      Past Medical History  Diagnosis Date  . Coronary atherosclerosis of native coronary artery     a. 10/2008 inf STEMI/PCI: LM 50d (IVUS-borderline lesion->med rx), LAD min irregs, LCX 24m 70d, OM nl, RCA 1023m3.5x28 Vision BMS);  b. 11/2008 Lexiscan MV: EF 65%, no isch/scar.  . Essential hypertension, benign   . Pure hypercholesterolemia   . AAA (abdominal aortic aneurysm)     a. 05/2013 CT: 5.8 cm AAA.  . Marland Kitcheniverticulitis     a. 05/2013 CT: descending/sigmoid jxn w/o abscess.  . Osteoarthritis   . Tobacco abuse     a. ongoing - 1ppd for better part of 50 yrs.  . Normocytic anemia   . Cellulitis 06/10/2013    Right antecubital fossa at site of IV  03/12/13  . PONV (postoperative nausea and vomiting)   . Diverticulitis   . GERD (gastroesophageal reflux disease)     Past Surgical History  Procedure Laterality Date  . Cardiac stents  2010  . Cholecystectomy N/A 06/19/2013    Procedure: LAPAROSCOPIC CHOLECYSTECTOMY;  Surgeon: DoHarl BowieMD;  [facility];  Laterality: N/A;  . Abdominal aortic endovascular stent graft N/A 07/02/2013    Procedure: ABDOMINAL AORTIC ENDOVASCULAR STENT GRAFT;  Surgeon: VaSerafina MitchellMD;  [facility];  Service: Vascular;  Laterality: N/A;  . Esophagogastroduodenoscopy N/A 08/05/2013    Procedure: ESOPHAGOGASTRODUODENOSCOPY (EGD);  Surgeon:  ViLear NgMD;  [facility];  Service: Endoscopy;  Laterality: N/A;  . Eye surgery Left     cataract surgery  . Back surgery      cervical fusion  . Colonoscopy with propofol N/A 10/15/2013    Procedure: COLONOSCOPY WITH PROPOFOL;  Surgeon: ViLear NgMD;  [facility];  Laterality: N/A;     Hospital Course See H&P, Labs, Consult and Test reports for all details in brief, patient was admitted for **  Active Problems:   Pancytopenia  History of Present Illness:This is a 89.o. year old male with past medical history of tobacco abuse, AAA s/p repair, diverticulosis, CAD s/p MI presenting with pancytopenia. Pt reports 1-2 weeks of fatigue and leg weakness.. Mild intermittent CPs. Went to his cardiologists office today to discuss CP in setting of prior hx/o CAD s/p MI and stents. Tentative plan was for catheterization in near future per pt. Had blood work drawn w/ noted hgb 6. Pt reports one prior episode of anemia associated with AAA repair approximately one year ago. Denies any known history of anemia in the past though does report/admit to taking iron previously. Denies any night sweats. No fevers or chills. No nausea or vomiting. Had a colonoscopy with benign polyp removal in the past year per patient. No black or tarry stools. No NSAID use. Positive every day smoker. Had one questionable episode of hemoptysis recently. Is not sure this was from eating a piece of red candy or  not. Minimal dyspnea. Presented to the ER afebrile, hemodynamically stable. Satting 99% on room air. White blood cell count 3.3, hemoglobin 6.1, platelet count 68. Creatinine 1.3, INR 1.3. Chest x-ray with mild vascular congestion and mild cardiomegaly  Pancytopenia - etiology is unclear, CT chest abdomen and pelvis showing evidence of hilar and mediastinal lymphadenopathy, and prominent upper abdomen lymph nodes . - Patient transfused 2 units packed red blood  cell, anemia is improving, hemoglobin is 8.3 posttransfusion, repeat hemoglobin 7.8 at day of discharge,  transfused another unit packed red blood cell prior to discharge. - Platelet count is 60,000 at day of discharge., no evidence of active bleed, no indication for transfusion.  - Hemoccult-negative -  oncology consult by Dr. Marin Olp greatly appreciated , status post bone marrow biopsy by IR 3/18, will follow as an outpatient with Dr. Anderson Malta regarding the results (may need mediastinoscopy to obtain adequate tissue from one of the mediastinal or hilar lymph nodes if bone marrow biopsy is unremarkable  per oncology )  Coronary artery disease with chest pain - Patient chest pain most likely exacerbated by his anemia. - Continue with aspirin, beta blockers, and statin.  Hyperlipidemia - Continue with statin  Tobacco abuse - Tobacco cessation recommended  Consults  Oncology  Procedures - 2 units packed red blood cell transfusion 3/17 - One unit packed red blood cell transfusion 3/18 - Bone marrow biopsy by IR 3/18  Significant Tests:  See full reports for all details    Ct Chest W Contrast  03/30/2014   CLINICAL DATA:  Anemia.  Left arm pain.  EXAM: CT CHEST, ABDOMEN, AND PELVIS WITH CONTRAST  TECHNIQUE: Multidetector CT imaging of the chest, abdomen and pelvis was performed following the standard protocol during bolus administration of intravenous contrast.  CONTRAST:  169m OMNIPAQUE IOHEXOL 300 MG/ML  SOLN  COMPARISON:  Chest radiograph 03/30/2014, CT 07/07/2013  FINDINGS: CT CHEST FINDINGS  There are multiple abnormal mediastinal nodes as well as smaller hilar nodes. The mediastinal nodes measure up to 1.4 cm. No axillary adenopathy is evident. Lung parenchyma is clear. There are apical blebs bilaterally. Central airways are patent. There are no effusions.  CT ABDOMEN AND PELVIS FINDINGS  There are moderately prominent upper abdominal nodes in the gastrohepatic ligament and portacaval  regions, measuring up to 1.4 cm. No periaortic or iliac chain adenopathy is evident. No significant mesenteric adenopathy is evident. There are normal appearances of the liver, spleen and pancreas. There is a 2.0 cm indeterminate left adrenal nodule. There are a few benign renal cysts measuring up to 2.0 cm. Mesentery and bowel appear unremarkable except for extensive colonic diverticulosis. No acute inflammatory changes are evident in the abdomen or pelvis. There is no ascites.  There is endoluminal stent graft repair of abdominal aortic aneurysm with intact appearances.  No skeletal lesions are evident.  IMPRESSION: 1. Mediastinal and hilar adenopathy. There also are moderately prominent upper abdominal nodes. This could represent lymphoma or other neoplastic process. Infectious or inflammatory entities are less likely. 2. 2.0 cm indeterminate left adrenal nodule. 3. Diverticulosis 4. Intact appearances of the abdominal aortic aneurysm endoluminal stent graft   Electronically Signed   By: DAndreas NewportM.D.   On: 03/30/2014 23:29   Ct Abdomen Pelvis W Contrast  03/30/2014   CLINICAL DATA:  Anemia.  Left arm pain.  EXAM: CT CHEST, ABDOMEN, AND PELVIS WITH CONTRAST  TECHNIQUE: Multidetector CT imaging of the chest, abdomen and pelvis was performed following the standard protocol during bolus  administration of intravenous contrast.  CONTRAST:  179m OMNIPAQUE IOHEXOL 300 MG/ML  SOLN  COMPARISON:  Chest radiograph 03/30/2014, CT 07/07/2013  FINDINGS: CT CHEST FINDINGS  There are multiple abnormal mediastinal nodes as well as smaller hilar nodes. The mediastinal nodes measure up to 1.4 cm. No axillary adenopathy is evident. Lung parenchyma is clear. There are apical blebs bilaterally. Central airways are patent. There are no effusions.  CT ABDOMEN AND PELVIS FINDINGS  There are moderately prominent upper abdominal nodes in the gastrohepatic ligament and portacaval regions, measuring up to 1.4 cm. No periaortic  or iliac chain adenopathy is evident. No significant mesenteric adenopathy is evident. There are normal appearances of the liver, spleen and pancreas. There is a 2.0 cm indeterminate left adrenal nodule. There are a few benign renal cysts measuring up to 2.0 cm. Mesentery and bowel appear unremarkable except for extensive colonic diverticulosis. No acute inflammatory changes are evident in the abdomen or pelvis. There is no ascites.  There is endoluminal stent graft repair of abdominal aortic aneurysm with intact appearances.  No skeletal lesions are evident.  IMPRESSION: 1. Mediastinal and hilar adenopathy. There also are moderately prominent upper abdominal nodes. This could represent lymphoma or other neoplastic process. Infectious or inflammatory entities are less likely. 2. 2.0 cm indeterminate left adrenal nodule. 3. Diverticulosis 4. Intact appearances of the abdominal aortic aneurysm endoluminal stent graft   Electronically Signed   By: DAndreas NewportM.D.   On: 03/30/2014 23:29   Dg Chest Port 1 View  03/30/2014   CLINICAL DATA:  Acute onset of chest pain.  Initial encounter.  EXAM: PORTABLE CHEST - 1 VIEW  COMPARISON:  Chest radiograph performed 08/02/2013  FINDINGS: The lungs are well-aerated. Mild vascular congestion is noted. There is no evidence of focal opacification, pleural effusion or pneumothorax.  The cardiomediastinal silhouette is mildly enlarged. No acute osseous abnormalities are seen.  IMPRESSION: Mild vascular congestion and mild cardiomegaly; lungs remain grossly clear.   Electronically Signed   By: JGarald BaldingM.D.   On: 03/30/2014 20:28     Today   Subjective:   NDemarqus Jocsontoday has no headache,no chest abdominal pain,no new weakness tingling or numbness, feels much better wants to go home today.   Objective:   Blood pressure 129/62, pulse 52, temperature 98.5 F (36.9 C), temperature source Oral, resp. rate 16, height 5' 10" (1.778 m), weight 89.359 kg (197 lb),  SpO2 98 %.  Intake/Output Summary (Last 24 hours) at 04/01/14 1533 Last data filed at 04/01/14 0910  Gross per 24 hour  Intake      0 ml  Output    700 ml  Net   -700 ml    Exam Awake Alert, Oriented *3, No new F.N deficits, Normal affect [name].AT,PERRAL Supple Neck,No JVD, No cervical lymphadenopathy appriciated.  Symmetrical Chest wall movement, Good air movement bilaterally, CTAB RRR,No Gallops,Rubs or new Murmurs, No Parasternal Heave +ve B.Sounds, Abd Soft, Non tender, No organomegaly appriciated, No rebound -guarding or rigidity. No Cyanosis, Clubbing or edema, No new Rash or bruise  Data Review   Cultures -   CBC w Diff: Lab Results  Component Value Date   WBC 3.6* 04/01/2014   HGB 7.8* 04/01/2014   HCT 23.5* 04/01/2014   PLT 60* 04/01/2014   LYMPHOPCT 21 03/31/2014   MONOPCT 18* 03/31/2014   EOSPCT 0 03/31/2014   BASOPCT 0 03/31/2014   CMP: Lab Results  Component Value Date   NA 139 04/01/2014   K  4.0 04/01/2014   CL 108 04/01/2014   CO2 25 04/01/2014   BUN 16 04/01/2014   CREATININE 1.29 04/01/2014   CREATININE 1.24 11/18/2013   PROT 6.9 04/01/2014   ALBUMIN 3.8 04/01/2014   BILITOT 0.9 04/01/2014   ALKPHOS 89 04/01/2014   AST 19 04/01/2014   ALT 14 04/01/2014  .  Micro Results No results found for this or any previous visit (from the past 240 hour(s)).   Discharge Instructions      Follow-up Information    Follow up with Shirline Frees, MD. Schedule an appointment as soon as possible for a visit in 1 week.   Specialty:  Family Medicine   Why:  post hospitalization follow up.   Contact information:   [place] [address] [phone]       Follow up with Volanda Napoleon, MD.   Specialty:  Oncology   Why:  you will be contacted by his office with appointment.   Contact information:   [address], [address] [place] [address] [phone]       Discharge Medications     Medication List     STOP taking these medications        celecoxib 200 MG capsule  Commonly known as:  CELEBREX      TAKE these medications        acetaminophen 325 MG tablet  Commonly known as:  TYLENOL  Take 325 mg by mouth every 6 (six) hours as needed for moderate pain.     aspirin EC 81 MG tablet  Take 1 tablet (81 mg total) by mouth daily.     HYDROcodone-acetaminophen 5-325 MG per tablet  Commonly known as:  NORCO/VICODIN  Take 1-2 tablets by mouth every 6 (six) hours as needed for moderate pain.     metoprolol tartrate 25 MG tablet  Commonly known as:  LOPRESSOR  Take 0.5 tablets (12.5 mg total) by mouth 2 (two) times daily.     nitroGLYCERIN 0.4 MG SL tablet  Commonly known as:  NITROSTAT  Place 1 tablet (0.4 mg total) under the tongue every 5 (five) minutes as needed for chest pain.     pantoprazole 40 MG tablet  Commonly known as:  PROTONIX  Take 40 mg by mouth daily.     simvastatin 20 MG tablet  Commonly known as:  ZOCOR  Take 1 tablet (20 mg total) by mouth at bedtime.         Total Time in preparing paper work, data evaluation and todays exam - 35 minutes  ,  M.D on 04/01/2014 at 3:33 PM  [facility] Office  [phone]

## 2014-04-01 NOTE — Progress Notes (Signed)
Patient discharge teaching given, including activity, diet, follow-up appoints, and medications. Patient verbalized understanding of all discharge instructions. IV access was d/c'd. Vitals are stable. Skin is intact except as charted in most recent assessments. Pt to be escorted out by NT, to be driven home by family. 

## 2014-04-01 NOTE — Sedation Documentation (Signed)
No pain, resting, family at bedside.

## 2014-04-02 ENCOUNTER — Encounter (HOSPITAL_COMMUNITY): Payer: Self-pay | Admitting: Cardiovascular Disease

## 2014-04-02 LAB — TYPE AND SCREEN
ABO/RH(D): A POS
Antibody Screen: NEGATIVE
UNIT DIVISION: 0
UNIT DIVISION: 0
UNIT DIVISION: 0
Unit division: 0

## 2014-04-02 NOTE — Care Management Note (Signed)
    Page 1 of 1   04/02/2014     7:21:59 AM CARE MANAGEMENT NOTE 04/02/2014  Patient:  [name], [name]   Account Number:  [id number]  Date Initiated:  04/02/2014  Documentation initiated by:  Tomi Bamberger  Subjective/Objective Assessment:   dx dx pancytopenia  admit- from home     Action/Plan:   Anticipated DC Date:  04/01/2014   Anticipated DC Plan:  [place]  CM consult      Choice offered to / List presented to:             Status of service:  Completed, signed off Medicare Important Message given?  NA - LOS <3 / Initial given by admissions (If response is "NO", the following Medicare IM given date fields will be blank) Date Medicare IM given:   Medicare IM given by:   Date Additional Medicare IM given:   Additional Medicare IM given by:    Discharge Disposition:  HOME/SELF CARE  Per UR Regulation:  Reviewed for med. necessity/level of care/duration of stay  If discussed at [place] of Stay Meetings, dates discussed:    Comments:  04/02/14 [place] RN, BSN [phone] no needs.

## 2014-04-06 ENCOUNTER — Encounter: Payer: Self-pay | Admitting: Hematology & Oncology

## 2014-04-06 ENCOUNTER — Ambulatory Visit: Payer: Medicare Other

## 2014-04-06 ENCOUNTER — Ambulatory Visit (HOSPITAL_BASED_OUTPATIENT_CLINIC_OR_DEPARTMENT_OTHER): Payer: Medicare Other | Admitting: Hematology & Oncology

## 2014-04-06 ENCOUNTER — Other Ambulatory Visit (HOSPITAL_BASED_OUTPATIENT_CLINIC_OR_DEPARTMENT_OTHER): Payer: Medicare Other | Admitting: Lab

## 2014-04-06 ENCOUNTER — Other Ambulatory Visit: Payer: Self-pay | Admitting: Hematology & Oncology

## 2014-04-06 VITALS — BP 156/71 | HR 61 | Temp 98.1°F | Resp 18 | Ht 70.0 in | Wt 195.0 lb

## 2014-04-06 DIAGNOSIS — D46Z Other myelodysplastic syndromes: Secondary | ICD-10-CM | POA: Diagnosis not present

## 2014-04-06 LAB — CBC WITH DIFFERENTIAL (CANCER CENTER ONLY)
HCT: 29.1 % — ABNORMAL LOW (ref 38.7–49.9)
HEMOGLOBIN: 9.8 g/dL — AB (ref 13.0–17.1)
MCH: 30.3 pg (ref 28.0–33.4)
MCHC: 33.7 g/dL (ref 32.0–35.9)
MCV: 90 fL (ref 82–98)
PLATELETS: 90 10*3/uL — AB (ref 145–400)
RBC: 3.23 10*6/uL — AB (ref 4.20–5.70)
RDW: 18.6 % — ABNORMAL HIGH (ref 11.1–15.7)
WBC: 4.4 10*3/uL (ref 4.0–10.0)

## 2014-04-06 LAB — MANUAL DIFFERENTIAL (CHCC SATELLITE)
ALC: 1.1 10*3/uL (ref 0.9–3.3)
ANC (CHCC HP manual diff): 2.7 10*3/uL (ref 1.5–6.5)
BASO: 1 % (ref 0–2)
LYMPH: 25 % (ref 14–48)
MONO: 12 % (ref 0–13)
PLT EST ~~LOC~~: DECREASED
SEG: 62 % (ref 40–75)

## 2014-04-06 LAB — CHCC SATELLITE - SMEAR

## 2014-04-06 NOTE — Progress Notes (Signed)
Hematology and Oncology Follow Up Visit  [name] [id number] 06/10/1945 69 y.o. 04/06/2014   Principle Diagnosis:   Refractory anemia with excess blasts (RAEB-2)  Current Therapy:    Observation     Interim History:  [name] is in first first office visit. I saw him in the hospital a week or so ago. He had presented with marked anemia and some thrombocytopenia. He had been transfused. We did go ahead and got a bone marrow on him. The pathology report ([id number]) showed refractory anemia with excess blasts. The pathologist felt that this was RAEB-2. I do not have the cytogenetic studies back yet.  He seems be doing pretty well. He really has not had any problems with fever. He's had no cough. He has had no shortness of breath. There's been no change in bowel or bladder habits.  He's had no bleeding. He's had no bruising. He's had no leg swelling.  I think he was last transfused about a week ago.  He's had no pain issues.  Overall, his performance status is ECOG 1.    Medications:  Current outpatient prescriptions:  .  acetaminophen (TYLENOL) 325 MG tablet, Take 325 mg by mouth every 6 (six) hours as needed for moderate pain., Disp: , Rfl:  .  aspirin EC 81 MG tablet, Take 1 tablet (81 mg total) by mouth daily., Disp: , Rfl:  .  ferrous sulfate 325 (65 FE) MG EC tablet, Take 325 mg by mouth daily with breakfast., Disp: , Rfl:  .  metoprolol tartrate (LOPRESSOR) 25 MG tablet, Take 0.5 tablets (12.5 mg total) by mouth 2 (two) times daily. (Patient taking differently: Take 12.5 mg by mouth 2 (two) times daily. TAKES 1/2 TABS BID=25 MG DAILY), Disp: 90 tablet, Rfl: 3 .  nitroGLYCERIN (NITROSTAT) 0.4 MG SL tablet, Place 1 tablet (0.4 mg total) under the tongue every 5 (five) minutes as needed for chest pain., Disp: 25 tablet, Rfl: 6 .  pantoprazole (PROTONIX) 40 MG tablet, Take 40 mg by mouth daily., Disp: , Rfl:  .  simvastatin (ZOCOR) 20 MG tablet, Take 1 tablet (20 mg total)  by mouth at bedtime., Disp: 30 tablet, Rfl: 5  Allergies:  Allergies  Allergen Reactions  . Vicodin [Hydrocodone-Acetaminophen] Itching  . Cephalexin Other (See Comments)    Nausea and stomach cramps  . Phenergan [Promethazine Hcl] Other (See Comments)    "restless legs"    Past Medical History, Surgical history, Social history, and Family History were reviewed and updated.  Review of Systems: As above  Physical Exam:  height is 5\' 10"  (1.778 m) and weight is 195 lb (88.451 kg). His oral temperature is 98.1 F (36.7 C). His blood pressure is 156/71 and his pulse is 61. His respiration is 18.   Wt Readings from Last 3 Encounters:  04/06/14 195 lb (88.451 kg)  03/30/14 197 lb (89.359 kg)  03/30/14 200 lb 6.4 oz (90.901 kg)     Well-developed and well-nourished white gentleman in no obvious distress. Head and neck exam shows no ocular or oral lesions. There are no palpable cervical or supraclavicular lymph nodes. Lungs are clear. Cardiac exam regular rate and rhythm with no murmurs, rubs or bruits. Abdomen is soft. He has good bowel sounds. There is no fluid wave. There is no palpable liver or spleen tip. Back exam shows no tenderness over the spine, ribs or hips. Extremities shows no clubbing, cyanosis or edema. Neurological exam shows no focal neurological deficits. Skin exam shows no  rashes, ecchymoses or petechia.  Lab Results  Component Value Date   WBC 4.4 04/06/2014   HGB 9.8* 04/06/2014   HCT 29.1* 04/06/2014   MCV 90 04/06/2014   PLT 90* 04/06/2014     Chemistry      Component Value Date/Time   NA 139 04/01/2014 0610   K 4.0 04/01/2014 0610   CL 108 04/01/2014 0610   CO2 25 04/01/2014 0610   BUN 16 04/01/2014 0610   CREATININE 1.29 04/01/2014 0610   CREATININE 1.24 11/18/2013 1320      Component Value Date/Time   CALCIUM 8.7 04/01/2014 0610   ALKPHOS 89 04/01/2014 0610   AST 19 04/01/2014 0610   ALT 14 04/01/2014 0610   BILITOT 0.9 04/01/2014 0610          Impression and Plan: [name] is 69 year old gentleman with refractory anemia with excess blasts. He was recently diagnosed with this.  He came in with his wife and son. I spent about an hour with them.   I gave them a copy of the pathology report.  I really think that we have to treat him. He clearly will progress to acute leukemia. I want to make sure that we try to minimize this.  His IPSS-R score is not back yet but I would think that this might be in the intermediate to possibly high risk. Again, the cytogenetics will be key.  I think that he would be a good candidate for treatment with Vidaza. I talked to them about this. I explained to them what Vidaza and how it works.  I explained to them that it may take several months before we really know how well it might help.  He is 69 years old. As such, I suppose that he might be classified as a transplant candidate.  I will try to get started with treatment on March 28.  We will need to do blood work weekly.  I want to get him back when we start the second cycle of Vidaza.   Volanda Napoleon, MD 3/23/20165:40 PM

## 2014-04-06 NOTE — Patient Instructions (Signed)
Smoking Cessation Quitting smoking is important to your health and has many advantages. However, it is not always easy to quit since nicotine is a very addictive drug. Oftentimes, people try 3 times or more before being able to quit. This document explains the best ways for you to prepare to quit smoking. Quitting takes hard work and a lot of effort, but you can do it. ADVANTAGES OF QUITTING SMOKING  You will live longer, feel better, and live better.  Your body will feel the impact of quitting smoking almost immediately.  Within 20 minutes, blood pressure decreases. Your pulse returns to its normal level.  After 8 hours, carbon monoxide levels in the blood return to normal. Your oxygen level increases.  After 24 hours, the chance of having a heart attack starts to decrease. Your breath, hair, and body stop smelling like smoke.  After 48 hours, damaged nerve endings begin to recover. Your sense of taste and smell improve.  After 72 hours, the body is virtually free of nicotine. Your bronchial tubes relax and breathing becomes easier.  After 2 to 12 weeks, lungs can hold more air. Exercise becomes easier and circulation improves.  The risk of having a heart attack, stroke, cancer, or lung disease is greatly reduced.  After 1 year, the risk of coronary heart disease is cut in half.  After 5 years, the risk of stroke falls to the same as a nonsmoker.  After 10 years, the risk of lung cancer is cut in half and the risk of other cancers decreases significantly.  After 15 years, the risk of coronary heart disease drops, usually to the level of a nonsmoker.  If you are pregnant, quitting smoking will improve your chances of having a healthy baby.  The people you live with, especially any children, will be healthier.  You will have extra money to spend on things other than cigarettes. QUESTIONS TO THINK ABOUT BEFORE ATTEMPTING TO QUIT You may want to talk about your answers with your  health care provider.  Why do you want to quit?  If you tried to quit in the past, what helped and what did not?  What will be the most difficult situations for you after you quit? How will you plan to handle them?  Who can help you through the tough times? Your family? Friends? A health care provider?  What pleasures do you get from smoking? What ways can you still get pleasure if you quit? Here are some questions to ask your health care provider:  How can you help me to be successful at quitting?  What medicine do you think would be best for me and how should I take it?  What should I do if I need more help?  What is smoking withdrawal like? How can I get information on withdrawal? GET READY  Set a quit date.  Change your environment by getting rid of all cigarettes, ashtrays, matches, and lighters in your home, car, or work. Do not let people smoke in your home.  Review your past attempts to quit. Think about what worked and what did not. GET SUPPORT AND ENCOURAGEMENT You have a better chance of being successful if you have help. You can get support in many ways.  Tell your family, friends, and coworkers that you are going to quit and need their support. Ask them not to smoke around you.  Get individual, group, or telephone counseling and support. Programs are available at local hospitals and health centers. Call   your local health department for information about programs in your area.  Spiritual beliefs and practices may help some smokers quit.  Download a "quit meter" on your computer to keep track of quit statistics, such as how long you have gone without smoking, cigarettes not smoked, and money saved.  Get a self-help book about quitting smoking and staying off tobacco. LEARN NEW SKILLS AND BEHAVIORS  Distract yourself from urges to smoke. Talk to someone, go for a walk, or occupy your time with a task.  Change your normal routine. Take a different route to work.  Drink tea instead of coffee. Eat breakfast in a different place.  Reduce your stress. Take a hot bath, exercise, or read a book.  Plan something enjoyable to do every day. Reward yourself for not smoking.  Explore interactive web-based programs that specialize in helping you quit. GET MEDICINE AND USE IT CORRECTLY Medicines can help you stop smoking and decrease the urge to smoke. Combining medicine with the above behavioral methods and support can greatly increase your chances of successfully quitting smoking.  Nicotine replacement therapy helps deliver nicotine to your body without the negative effects and risks of smoking. Nicotine replacement therapy includes nicotine gum, lozenges, inhalers, nasal sprays, and skin patches. Some may be available over-the-counter and others require a prescription.  Antidepressant medicine helps people abstain from smoking, but how this works is unknown. This medicine is available by prescription.  Nicotinic receptor partial agonist medicine simulates the effect of nicotine in your brain. This medicine is available by prescription. Ask your health care provider for advice about which medicines to use and how to use them based on your health history. Your health care provider will tell you what side effects to look out for if you choose to be on a medicine or therapy. Carefully read the information on the package. Do not use any other product containing nicotine while using a nicotine replacement product.  RELAPSE OR DIFFICULT SITUATIONS Most relapses occur within the first 3 months after quitting. Do not be discouraged if you start smoking again. Remember, most people try several times before finally quitting. You may have symptoms of withdrawal because your body is used to nicotine. You may crave cigarettes, be irritable, feel very hungry, cough often, get headaches, or have difficulty concentrating. The withdrawal symptoms are only temporary. They are strongest  when you first quit, but they will go away within 10-14 days. To reduce the chances of relapse, try to:  Avoid drinking alcohol. Drinking lowers your chances of successfully quitting.  Reduce the amount of caffeine you consume. Once you quit smoking, the amount of caffeine in your body increases and can give you symptoms, such as a rapid heartbeat, sweating, and anxiety.  Avoid smokers because they can make you want to smoke.  Do not let weight gain distract you. Many smokers will gain weight when they quit, usually less than 10 pounds. Eat a healthy diet and stay active. You can always lose the weight gained after you quit.  Find ways to improve your mood other than smoking. FOR MORE INFORMATION  www.smokefree.gov  Document Released: 12/25/2000 Document Revised: 05/17/2013 Document Reviewed: 04/11/2011 [employer] Patient Information 2015 [employer]. This information is not intended to replace advice given to you by your health care provider. Make sure you discuss any questions you have with your health care provider.  

## 2014-04-07 ENCOUNTER — Encounter: Payer: Self-pay | Admitting: *Deleted

## 2014-04-07 ENCOUNTER — Other Ambulatory Visit: Payer: Medicare Other

## 2014-04-07 LAB — FERRITIN CHCC: FERRITIN: 269 ng/mL (ref 22–316)

## 2014-04-07 LAB — IRON AND TIBC CHCC
%SAT: 38 % (ref 20–55)
Iron: 128 ug/dL (ref 42–163)
TIBC: 336 ug/dL (ref 202–409)
UIBC: 208 ug/dL (ref 117–376)

## 2014-04-07 NOTE — Addendum Note (Signed)
Addended by: Volanda Napoleon on: 04/07/2014 04:58 PM   Modules accepted: Orders

## 2014-04-08 LAB — COMPREHENSIVE METABOLIC PANEL
ALBUMIN: 4.6 g/dL (ref 3.5–5.2)
ALT: 18 U/L (ref 0–53)
AST: 21 U/L (ref 0–37)
Alkaline Phosphatase: 99 U/L (ref 39–117)
BILIRUBIN TOTAL: 0.5 mg/dL (ref 0.2–1.2)
BUN: 24 mg/dL — AB (ref 6–23)
CO2: 19 mEq/L (ref 19–32)
Calcium: 9.3 mg/dL (ref 8.4–10.5)
Chloride: 104 mEq/L (ref 96–112)
Creatinine, Ser: 1.3 mg/dL (ref 0.50–1.35)
GLUCOSE: 97 mg/dL (ref 70–99)
POTASSIUM: 4.4 meq/L (ref 3.5–5.3)
Sodium: 136 mEq/L (ref 135–145)
Total Protein: 7.7 g/dL (ref 6.0–8.3)

## 2014-04-08 LAB — ERYTHROPOIETIN: Erythropoietin: 132.8 m[IU]/mL — ABNORMAL HIGH (ref 2.6–18.5)

## 2014-04-08 LAB — RETICULOCYTES (CHCC)
ABS Retic: 29.9 10*3/uL (ref 19.0–186.0)
RBC.: 3.32 MIL/uL — AB (ref 4.22–5.81)
RETIC CT PCT: 0.9 % (ref 0.4–2.3)

## 2014-04-11 ENCOUNTER — Encounter (HOSPITAL_COMMUNITY): Payer: Self-pay

## 2014-04-11 ENCOUNTER — Ambulatory Visit (HOSPITAL_BASED_OUTPATIENT_CLINIC_OR_DEPARTMENT_OTHER): Payer: Medicare Other

## 2014-04-11 ENCOUNTER — Other Ambulatory Visit: Payer: Self-pay | Admitting: Nurse Practitioner

## 2014-04-11 ENCOUNTER — Ambulatory Visit (HOSPITAL_COMMUNITY): Admission: RE | Admit: 2014-04-11 | Payer: Medicare Other | Source: Ambulatory Visit | Admitting: Cardiovascular Disease

## 2014-04-11 ENCOUNTER — Inpatient Hospital Stay: Payer: Medicare Other

## 2014-04-11 ENCOUNTER — Other Ambulatory Visit (HOSPITAL_BASED_OUTPATIENT_CLINIC_OR_DEPARTMENT_OTHER): Payer: Medicare Other

## 2014-04-11 ENCOUNTER — Encounter (HOSPITAL_COMMUNITY): Admission: RE | Payer: Self-pay | Source: Ambulatory Visit

## 2014-04-11 ENCOUNTER — Encounter: Payer: Self-pay | Admitting: Hematology & Oncology

## 2014-04-11 DIAGNOSIS — D61818 Other pancytopenia: Secondary | ICD-10-CM

## 2014-04-11 DIAGNOSIS — Z5111 Encounter for antineoplastic chemotherapy: Secondary | ICD-10-CM

## 2014-04-11 DIAGNOSIS — D4620 Refractory anemia with excess of blasts, unspecified: Secondary | ICD-10-CM | POA: Diagnosis not present

## 2014-04-11 DIAGNOSIS — D46Z Other myelodysplastic syndromes: Secondary | ICD-10-CM

## 2014-04-11 LAB — MANUAL DIFFERENTIAL (CHCC SATELLITE)
ALC: 0.9 10*3/uL (ref 0.9–3.3)
ANC (CHCC MAN DIFF): 3.3 10*3/uL (ref 1.5–6.5)
EOS: 1 % (ref 0–7)
LYMPH: 18 % (ref 14–48)
MONO: 14 % — ABNORMAL HIGH (ref 0–13)
PLT EST ~~LOC~~: DECREASED
SEG: 67 % (ref 40–75)

## 2014-04-11 LAB — CBC WITH DIFFERENTIAL (CANCER CENTER ONLY)
HCT: 28.9 % — ABNORMAL LOW (ref 38.7–49.9)
HEMOGLOBIN: 9.6 g/dL — AB (ref 13.0–17.1)
MCH: 30 pg (ref 28.0–33.4)
MCHC: 33.2 g/dL (ref 32.0–35.9)
MCV: 90 fL (ref 82–98)
Platelets: 77 10*3/uL — ABNORMAL LOW (ref 145–400)
RBC: 3.2 10*6/uL — AB (ref 4.20–5.70)
RDW: 18 % — ABNORMAL HIGH (ref 11.1–15.7)
WBC: 4.9 10*3/uL (ref 4.0–10.0)

## 2014-04-11 SURGERY — LEFT HEART CATHETERIZATION WITH CORONARY ANGIOGRAM
Anesthesia: LOCAL

## 2014-04-11 MED ORDER — PROCHLORPERAZINE MALEATE 10 MG PO TABS: 10.0000 mg | ORAL_TABLET | Freq: Four times a day (QID) | ORAL | Status: DC | PRN

## 2014-04-11 MED ORDER — LORAZEPAM 0.5 MG PO TABS: 0.5000 mg | ORAL_TABLET | Freq: Four times a day (QID) | ORAL | Status: DC | PRN

## 2014-04-11 MED ORDER — ONDANSETRON HCL 8 MG PO TABS
ORAL_TABLET | ORAL | Status: AC
  Filled 2014-04-11: qty 1

## 2014-04-11 MED ORDER — AZACITIDINE CHEMO SQ INJECTION
76.0000 mg/m2 | Freq: Once | INTRAMUSCULAR | Status: AC
  Administered 2014-04-11: 160 mg via SUBCUTANEOUS
  Filled 2014-04-11: qty 6.4

## 2014-04-11 MED ORDER — ONDANSETRON HCL 8 MG PO TABS
8.0000 mg | ORAL_TABLET | Freq: Once | ORAL | Status: AC
  Administered 2014-04-11: 8 mg via ORAL

## 2014-04-11 NOTE — Patient Instructions (Signed)
[facility] Discharge Instructions for Patients Receiving Chemotherapy  Today you received the following chemotherapy agents Vidaza  To help prevent nausea and vomiting after your treatment, we encourage you to take your nausea medication as prescribed    If you develop nausea and vomiting that is not controlled by your nausea medication, call the clinic. If it is after clinic hours your family physician or the after hours number for the clinic or go to the Emergency Department.   BELOW ARE SYMPTOMS THAT SHOULD BE REPORTED IMMEDIATELY:  *FEVER GREATER THAN 100.5 F  *CHILLS WITH OR WITHOUT FEVER  NAUSEA AND VOMITING THAT IS NOT CONTROLLED WITH YOUR NAUSEA MEDICATION  *UNUSUAL SHORTNESS OF BREATH  *UNUSUAL BRUISING OR BLEEDING  TENDERNESS IN MOUTH AND THROAT WITH OR WITHOUT PRESENCE OF ULCERS  *URINARY PROBLEMS  *BOWEL PROBLEMS  UNUSUAL RASH Items with * indicate a potential emergency and should be followed up as soon as possible.  One of the nurses will contact you 24 hours after your treatment. Please let the nurse know about any problems that you may have experienced. Feel free to call the clinic you have any questions or concerns. The clinic phone number is [phone].   I have been informed and understand all the instructions given to me. I know to contact the clinic, my physician, or go to the Emergency Department if any problems should occur. I do not have any questions at this time, but understand that I may call the clinic during office hours   should I have any questions or need assistance in obtaining follow up care.    __________________________________________  _____________  __________ Signature of Patient or Authorized Representative            Date                   Time    __________________________________________ Nurse's Signature  Azacitidine suspension for injection (subcutaneous use) What is this medicine? AZACITIDINE (ay [place]) is a chemotherapy drug. This medicine reduces the growth of cancer cells and can suppress the immune system. It is used for treating myelodysplastic syndrome or some types of leukemia. This medicine may be used for other purposes; ask your health care provider or pharmacist if you have questions. COMMON BRAND NAME(S): Vidaza What should I tell my health care provider before I take this medicine? They need to know if you have any of these conditions: -infection (especially a virus infection such as chickenpox, cold sores, or herpes) -kidney disease -liver disease -liver tumors -an unusual or allergic reaction to azacitidine, mannitol, other medicines, foods, dyes, or preservatives -pregnant or trying to get pregnant -breast-feeding How should I use this medicine? This medicine is for injection under the skin. It is administered in a hospital or clinic by a specially trained health care professional. Talk to your pediatrician regarding the use of this medicine in children. While this drug may be prescribed for selected conditions, precautions do apply. Overdosage: If you think you have taken too much of this medicine contact a poison control center or emergency room at once. NOTE: This medicine is only for you. Do not share this medicine with others. What if I miss a dose? It is important not to miss your dose. Call your doctor or health care professional if you are unable to keep an appointment. What may interact with this medicine? -vaccines Talk to your doctor or health care professional before taking any of these medicines: -  acetaminophen -aspirin -ibuprofen -ketoprofen -naproxen This list may not describe all possible interactions. Give your health care provider a list of all the medicines, herbs, non-prescription drugs, or dietary supplements you use. Also tell them if you smoke, drink alcohol, or use illegal drugs. Some items may interact with your medicine. What should I  watch for while using this medicine? Visit your doctor for checks on your progress. This drug may make you feel generally unwell. This is not uncommon, as chemotherapy can affect healthy cells as well as cancer cells. Report any side effects. Continue your course of treatment even though you feel ill unless your doctor tells you to stop. In some cases, you may be given additional medicines to help with side effects. Follow all directions for their use. Call your doctor or health care professional for advice if you get a fever, chills or sore throat, or other symptoms of a cold or flu. Do not treat yourself. This drug decreases your body's ability to fight infections. Try to avoid being around people who are sick. This medicine may increase your risk to bruise or bleed. Call your doctor or health care professional if you notice any unusual bleeding. Be careful brushing and flossing your teeth or using a toothpick because you may get an infection or bleed more easily. If you have any dental work done, tell your dentist you are receiving this medicine. Avoid taking products that contain aspirin, acetaminophen, ibuprofen, naproxen, or ketoprofen unless instructed by your doctor. These medicines may hide a fever. Do not have any vaccinations without your doctor's approval and avoid anyone who has recently had oral polio vaccine. Do not become pregnant while taking this medicine. Women should inform their doctor if they wish to become pregnant or think they might be pregnant. There is a potential for serious side effects to an unborn child. Talk to your health care professional or pharmacist for more information. Do not breast-feed an infant while taking this medicine. If you are a man, you should not father a child while receiving treatment. What side effects may I notice from receiving this medicine? Side effects that you should report to your doctor or health care professional as soon as possible: -allergic  reactions like skin rash, itching or hives, swelling of the face, lips, or tongue -low blood counts - this medicine may decrease the number of white blood cells, red blood cells and platelets. You may be at increased risk for infections and bleeding. -signs of infection - fever or chills, cough, sore throat, pain or difficulty passing urine -signs of decreased platelets or bleeding - bruising, pinpoint red spots on the skin, black, tarry stools, blood in the urine -signs of decreased red blood cells - unusually weak or tired, fainting spells, lightheadedness -reactions at the injection site including redness, pain, itching, or bruising -breathing problems -changes in vision -fever -mouth sores -stomach pain -vomiting Side effects that usually do not require medical attention (report to your doctor or health care professional if they continue or are bothersome): -constipation -diarrhea -loss of appetite -nausea -pain or redness at the injection site -weak or tired This list may not describe all possible side effects. Call your doctor for medical advice about side effects. You may report side effects to FDA at 1-800-FDA-1088. Where should I keep my medicine? This drug is given in a hospital or clinic and will not be stored at home. NOTE: This sheet is a summary. It may not cover all possible information. If you have  questions about this medicine, talk to your doctor, pharmacist, or health care provider.  2015, [employer]. (2007-03-26 11:04:07)

## 2014-04-12 ENCOUNTER — Encounter: Payer: Self-pay | Admitting: Hematology & Oncology

## 2014-04-12 ENCOUNTER — Ambulatory Visit (HOSPITAL_BASED_OUTPATIENT_CLINIC_OR_DEPARTMENT_OTHER): Payer: Medicare Other

## 2014-04-12 ENCOUNTER — Telehealth: Payer: Self-pay | Admitting: Surgery

## 2014-04-12 DIAGNOSIS — D61818 Other pancytopenia: Secondary | ICD-10-CM

## 2014-04-12 DIAGNOSIS — D4620 Refractory anemia with excess of blasts, unspecified: Secondary | ICD-10-CM

## 2014-04-12 MED ORDER — ONDANSETRON HCL 8 MG PO TABS
8.0000 mg | ORAL_TABLET | Freq: Once | ORAL | Status: AC
  Administered 2014-04-12: 8 mg via ORAL

## 2014-04-12 MED ORDER — AZACITIDINE CHEMO SQ INJECTION
76.0000 mg/m2 | Freq: Once | INTRAMUSCULAR | Status: AC
  Administered 2014-04-12: 160 mg via SUBCUTANEOUS
  Filled 2014-04-12: qty 6.4

## 2014-04-12 MED ORDER — ONDANSETRON HCL 8 MG PO TABS
ORAL_TABLET | ORAL | Status: AC
Start: 2014-04-12 — End: 2014-04-12
  Filled 2014-04-12: qty 1

## 2014-04-12 NOTE — Patient Instructions (Signed)
[facility] Discharge Instructions for Patients Receiving Chemotherapy  Today you received the following chemotherapy agents Vidaza.  To help prevent nausea and vomiting after your treatment, we encourage you to take your nausea medications as directed.   If you develop nausea and vomiting that is not controlled by your nausea medication, call the clinic.   BELOW ARE SYMPTOMS THAT SHOULD BE REPORTED IMMEDIATELY:  *FEVER GREATER THAN 100.5 F  *CHILLS WITH OR WITHOUT FEVER  NAUSEA AND VOMITING THAT IS NOT CONTROLLED WITH YOUR NAUSEA MEDICATION  *UNUSUAL SHORTNESS OF BREATH  *UNUSUAL BRUISING OR BLEEDING  TENDERNESS IN MOUTH AND THROAT WITH OR WITHOUT PRESENCE OF ULCERS  *URINARY PROBLEMS  *BOWEL PROBLEMS  UNUSUAL RASH Items with * indicate a potential emergency and should be followed up as soon as possible.  Feel free to call the clinic you have any questions or concerns. The clinic phone number is [phone].

## 2014-04-12 NOTE — Telephone Encounter (Addendum)
-----   Message from Denman George, RN sent at 04/11/2014  5:47 PM EDT ----- Regarding: RE: Triage I had Dr.Brabham review his last CT abd/ pelvis of 3/16.  He said to cancel the Abdominal U/S in May; I left a voice message for the pt. re: VWB's recommendation, and to expect a phone call re: time change of appt. 5/9. Thanks.   ----- Message -----    From: Gena Fray    Sent: 04/11/2014   9:16 AM      To: Denman George, RN Subject: Triage                                         Patients wife called regarding patients appointment in May for EVAR scan and see MD. Patient has had a recent CT of his abdomen, and wanted to know if we really needed to repeat with ultrasound. Please advise.   Thanks, Hinton Dyer   04/12/14: lm for pt re appt change- asked that pt cb to confirm, dpm

## 2014-04-12 NOTE — Progress Notes (Signed)
Right abdominal area red, tender at injection site. Will continue to rotate sites and inquire about using other subcutaneous sites.

## 2014-04-13 ENCOUNTER — Ambulatory Visit (HOSPITAL_BASED_OUTPATIENT_CLINIC_OR_DEPARTMENT_OTHER): Payer: Medicare Other | Admitting: Hematology & Oncology

## 2014-04-13 ENCOUNTER — Ambulatory Visit (HOSPITAL_BASED_OUTPATIENT_CLINIC_OR_DEPARTMENT_OTHER): Payer: Medicare Other

## 2014-04-13 ENCOUNTER — Encounter: Payer: Self-pay | Admitting: Hematology & Oncology

## 2014-04-13 DIAGNOSIS — L509 Urticaria, unspecified: Secondary | ICD-10-CM

## 2014-04-13 DIAGNOSIS — D46Z Other myelodysplastic syndromes: Secondary | ICD-10-CM

## 2014-04-13 DIAGNOSIS — D4620 Refractory anemia with excess of blasts, unspecified: Secondary | ICD-10-CM | POA: Diagnosis not present

## 2014-04-13 DIAGNOSIS — D61818 Other pancytopenia: Secondary | ICD-10-CM

## 2014-04-13 DIAGNOSIS — Z5111 Encounter for antineoplastic chemotherapy: Secondary | ICD-10-CM | POA: Diagnosis not present

## 2014-04-13 HISTORY — DX: Other myelodysplastic syndromes: D46.Z

## 2014-04-13 MED ORDER — METHYLPREDNISOLONE SODIUM SUCC 125 MG IJ SOLR
INTRAMUSCULAR | Status: AC
  Filled 2014-04-13: qty 2

## 2014-04-13 MED ORDER — ONDANSETRON HCL 8 MG PO TABS
8.0000 mg | ORAL_TABLET | Freq: Once | ORAL | Status: AC
  Administered 2014-04-13: 8 mg via ORAL

## 2014-04-13 MED ORDER — ONDANSETRON HCL 8 MG PO TABS
ORAL_TABLET | ORAL | Status: AC
  Filled 2014-04-13: qty 1

## 2014-04-13 MED ORDER — FAMOTIDINE IN NACL 20-0.9 MG/50ML-% IV SOLN
40.0000 mg | Freq: Two times a day (BID) | INTRAVENOUS | Status: DC
  Administered 2014-04-13: 40 mg via INTRAVENOUS

## 2014-04-13 MED ORDER — AZACITIDINE CHEMO SQ INJECTION
75.0000 mg/m2 | Freq: Once | INTRAMUSCULAR | Status: AC
  Administered 2014-04-13: 157.5 mg via SUBCUTANEOUS
  Filled 2014-04-13: qty 6.3

## 2014-04-13 MED ORDER — METHYLPREDNISOLONE SODIUM SUCC 125 MG IJ SOLR
80.0000 mg | Freq: Once | INTRAMUSCULAR | Status: AC
  Administered 2014-04-13: 80 mg via INTRAVENOUS

## 2014-04-13 NOTE — Progress Notes (Signed)
1:30 PM Reddened areas have improved. Gave instructions for anaphylaxis, verbalized understanding.

## 2014-04-13 NOTE — Progress Notes (Signed)
I saw [name] quickly. He has myelodysplasia. He has been started on Vidaza this week.  Yesterday, he appeared to have a reaction to [place]. He developed these urticaria on his skin. This mostly is prominent with the right arm. He has areas of urticaria on his back and abdomen.  A maybe slightly pruritic. He had one episode of low-grade temperature.  He has only had 2 days of Vidaza. The first day went well.  His been no change in his medications.  He's had no travel.  He's had no leg swelling.  We did check an erythropoietin level on him. It was 133. As such, we can certainly consider [place] on him.  On his exam, although his vital signs were stable. His temperature was 98. Pulse 70. Blood pressure 152/63. His skin showed areas of urticaria. These were somewhat erythematous. There are slightly raised. The largest area was on his right arm. I saw nothing on his face or neck. His lungs were clear area did cardiac exam regular rate and rhythm. Abdomen is soft. Has good bowel sounds. There is no palpable liver or spleen tip. Back exam shows no tenderness over the spine, ribs or hips. Skin exam shows these areas of urticaria. They are slightly raised. They are nontender. There is no associated vesicles. There are scattered throughout his body. Neurological exam is nonfocal.  I just had to believe that these areas of urticaria are from the [place]. It is real reported to have an allergic reaction to [place]. 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  I will give him some premedication with Solu-Medrol at 80 mg and Pepcid at 40 mg.  If this skin reaction persists, we will have to consider intravenous Vidaza.  I spent about 25 minutes with he and his wife trying to sort out what was causing this.

## 2014-04-13 NOTE — Patient Instructions (Signed)
[facility] Discharge Instructions for Patients Receiving Chemotherapy  Today you received the following chemotherapy agents Vidaza.  To help prevent nausea and vomiting after your treatment, we encourage you to take your nausea medications as directed.   If you develop nausea and vomiting that is not controlled by your nausea medication, call the clinic.   BELOW ARE SYMPTOMS THAT SHOULD BE REPORTED IMMEDIATELY:  *FEVER GREATER THAN 100.5 F  *CHILLS WITH OR WITHOUT FEVER  NAUSEA AND VOMITING THAT IS NOT CONTROLLED WITH YOUR NAUSEA MEDICATION  *UNUSUAL SHORTNESS OF BREATH  *UNUSUAL BRUISING OR BLEEDING  TENDERNESS IN MOUTH AND THROAT WITH OR WITHOUT PRESENCE OF ULCERS  *URINARY PROBLEMS  *BOWEL PROBLEMS  UNUSUAL RASH Items with * indicate a potential emergency and should be followed up as soon as possible.  Feel free to call the clinic you have any questions or concerns. The clinic phone number is [phone].  Anaphylactic Reaction An anaphylactic reaction is a sudden, severe allergic reaction. It affects the whole body. It can be life threatening. You may need to stay in the hospital.  [place] a medical bracelet or necklace that lists your allergy.  Carry your allergy kit or medicine shot to treat severe allergic reactions with you. These can save your life.  Do not drive until medicine from your shot has worn off, unless your doctor says it is okay.  If you have hives or a rash:  Take medicine as told by your doctor.  You may take over-the-counter antihistamine medicine.  Place cold cloths on your skin. Take baths in cool water. Avoid hot baths and hot showers. GET HELP RIGHT AWAY IF:   Your mouth is puffy (swollen), or you have trouble breathing.  You start making whistling sounds when you breathe (wheezing).  You have a tight feeling in your chest or throat.  You have a rash, hives, puffiness, or itching on your body.  You throw  up (vomit) or have watery poop (diarrhea).  You feel dizzy or pass out (faint).  You think you are having an allergic reaction.  You have new symptoms. This is an emergency. Use your medicine shot or allergy kit as told. Call your local emergency services (911 in [place] Even if you feel better after the shot, you need to go to the hospital emergency department. MAKE SURE YOU:   Understand these instructions.  Will watch your condition.  Will get help right away if you are not doing well or get worse. Document Released: 06/19/2007 Document Revised: 07/02/2011 Document Reviewed: 04/03/2011 [facility] Patient Information 2015 [place], [place]. This information is not intended to replace advice given to you by your health care provider. Make sure you discuss any questions you have with your health care provider.

## 2014-04-14 ENCOUNTER — Other Ambulatory Visit (HOSPITAL_COMMUNITY): Payer: Self-pay | Admitting: *Deleted

## 2014-04-14 ENCOUNTER — Ambulatory Visit (HOSPITAL_BASED_OUTPATIENT_CLINIC_OR_DEPARTMENT_OTHER): Payer: Medicare Other

## 2014-04-14 DIAGNOSIS — Z5111 Encounter for antineoplastic chemotherapy: Secondary | ICD-10-CM

## 2014-04-14 DIAGNOSIS — D4620 Refractory anemia with excess of blasts, unspecified: Secondary | ICD-10-CM | POA: Diagnosis not present

## 2014-04-14 DIAGNOSIS — D61818 Other pancytopenia: Secondary | ICD-10-CM

## 2014-04-14 MED ORDER — FAMOTIDINE IN NACL 20-0.9 MG/50ML-% IV SOLN
40.0000 mg | Freq: Once | INTRAVENOUS | Status: AC
  Administered 2014-04-14: 40 mg via INTRAVENOUS

## 2014-04-14 MED ORDER — ONDANSETRON HCL 8 MG PO TABS
8.0000 mg | ORAL_TABLET | Freq: Once | ORAL | Status: AC
  Administered 2014-04-14: 8 mg via ORAL

## 2014-04-14 MED ORDER — ONDANSETRON HCL 8 MG PO TABS
ORAL_TABLET | ORAL | Status: AC
  Filled 2014-04-14: qty 1

## 2014-04-14 MED ORDER — METHYLPREDNISOLONE SODIUM SUCC 125 MG IJ SOLR
125.0000 mg | Freq: Once | INTRAMUSCULAR | Status: AC
  Administered 2014-04-14: 125 mg via INTRAVENOUS

## 2014-04-14 MED ORDER — FAMOTIDINE IN NACL 20-0.9 MG/50ML-% IV SOLN
INTRAVENOUS | Status: AC
  Filled 2014-04-14: qty 100

## 2014-04-14 MED ORDER — AZACITIDINE CHEMO SQ INJECTION
76.0000 mg/m2 | Freq: Once | INTRAMUSCULAR | Status: AC
  Administered 2014-04-14: 160 mg via SUBCUTANEOUS
  Filled 2014-04-14: qty 6.4

## 2014-04-14 MED ORDER — METHYLPREDNISOLONE SODIUM SUCC 125 MG IJ SOLR
INTRAMUSCULAR | Status: AC
  Filled 2014-04-14: qty 2

## 2014-04-14 NOTE — Patient Instructions (Signed)
Azacitidine suspension for injection (subcutaneous use) What is this medicine? AZACITIDINE (ay [place]) is a chemotherapy drug. This medicine reduces the growth of cancer cells and can suppress the immune system. It is used for treating myelodysplastic syndrome or some types of leukemia. This medicine may be used for other purposes; ask your health care provider or pharmacist if you have questions. COMMON BRAND NAME(S): Vidaza What should I tell my health care provider before I take this medicine? They need to know if you have any of these conditions: -infection (especially a virus infection such as chickenpox, cold sores, or herpes) -kidney disease -liver disease -liver tumors -an unusual or allergic reaction to azacitidine, mannitol, other medicines, foods, dyes, or preservatives -pregnant or trying to get pregnant -breast-feeding How should I use this medicine? This medicine is for injection under the skin. It is administered in a hospital or clinic by a specially trained health care professional. Talk to your pediatrician regarding the use of this medicine in children. While this drug may be prescribed for selected conditions, precautions do apply. Overdosage: If you think you have taken too much of this medicine contact a poison control center or emergency room at once. NOTE: This medicine is only for you. Do not share this medicine with others. What if I miss a dose? It is important not to miss your dose. Call your doctor or health care professional if you are unable to keep an appointment. What may interact with this medicine? -vaccines Talk to your doctor or health care professional before taking any of these medicines: -acetaminophen -aspirin -ibuprofen -ketoprofen -naproxen This list may not describe all possible interactions. Give your health care provider a list of all the medicines, herbs, non-prescription drugs, or dietary supplements you use. Also tell them if you  smoke, drink alcohol, or use illegal drugs. Some items may interact with your medicine. What should I watch for while using this medicine? Visit your doctor for checks on your progress. This drug may make you feel generally unwell. This is not uncommon, as chemotherapy can affect healthy cells as well as cancer cells. Report any side effects. Continue your course of treatment even though you feel ill unless your doctor tells you to stop. In some cases, you may be given additional medicines to help with side effects. Follow all directions for their use. Call your doctor or health care professional for advice if you get a fever, chills or sore throat, or other symptoms of a cold or flu. Do not treat yourself. This drug decreases your body's ability to fight infections. Try to avoid being around people who are sick. This medicine may increase your risk to bruise or bleed. Call your doctor or health care professional if you notice any unusual bleeding. Be careful brushing and flossing your teeth or using a toothpick because you may get an infection or bleed more easily. If you have any dental work done, tell your dentist you are receiving this medicine. Avoid taking products that contain aspirin, acetaminophen, ibuprofen, naproxen, or ketoprofen unless instructed by your doctor. These medicines may hide a fever. Do not have any vaccinations without your doctor's approval and avoid anyone who has recently had oral polio vaccine. Do not become pregnant while taking this medicine. Women should inform their doctor if they wish to become pregnant or think they might be pregnant. There is a potential for serious side effects to an unborn child. Talk to your health care professional or pharmacist for more information.  Do not breast-feed an infant while taking this medicine. If you are a man, you should not father a child while receiving treatment. What side effects may I notice from receiving this medicine? Side  effects that you should report to your doctor or health care professional as soon as possible: -allergic reactions like skin rash, itching or hives, swelling of the face, lips, or tongue -low blood counts - this medicine may decrease the number of white blood cells, red blood cells and platelets. You may be at increased risk for infections and bleeding. -signs of infection - fever or chills, cough, sore throat, pain or difficulty passing urine -signs of decreased platelets or bleeding - bruising, pinpoint red spots on the skin, black, tarry stools, blood in the urine -signs of decreased red blood cells - unusually weak or tired, fainting spells, lightheadedness -reactions at the injection site including redness, pain, itching, or bruising -breathing problems -changes in vision -fever -mouth sores -stomach pain -vomiting Side effects that usually do not require medical attention (report to your doctor or health care professional if they continue or are bothersome): -constipation -diarrhea -loss of appetite -nausea -pain or redness at the injection site -weak or tired This list may not describe all possible side effects. Call your doctor for medical advice about side effects. You may report side effects to FDA at 1-800-FDA-1088. Where should I keep my medicine? This drug is given in a hospital or clinic and will not be stored at home. NOTE: This sheet is a summary. It may not cover all possible information. If you have questions about this medicine, talk to your doctor, pharmacist, or health care provider.  2015, [employer]. (2007-03-26 11:04:07)

## 2014-04-14 NOTE — Progress Notes (Signed)
Urticaria on trunk, bilateral legs and arms assessed by MD with instruction to proceed with treatment. Premeds administered as ordered. IVF given for 30 minutes post Vidaza administration with no change noted to urticaria. D/C with instructions to contact office for worsening. Re-assess at tomorrow's appt. Also per Dr Ginette Pitman and Gerald Stabs pharmacist, OK to resume Ativan prn for sleep. dph

## 2014-04-15 ENCOUNTER — Encounter: Payer: Self-pay | Admitting: Surgery

## 2014-04-15 ENCOUNTER — Other Ambulatory Visit: Payer: Self-pay | Admitting: *Deleted

## 2014-04-15 ENCOUNTER — Ambulatory Visit (HOSPITAL_BASED_OUTPATIENT_CLINIC_OR_DEPARTMENT_OTHER): Payer: Medicare Other

## 2014-04-15 DIAGNOSIS — D61818 Other pancytopenia: Secondary | ICD-10-CM

## 2014-04-15 DIAGNOSIS — D4620 Refractory anemia with excess of blasts, unspecified: Secondary | ICD-10-CM

## 2014-04-15 DIAGNOSIS — Z5111 Encounter for antineoplastic chemotherapy: Secondary | ICD-10-CM | POA: Diagnosis not present

## 2014-04-15 MED ORDER — ONDANSETRON HCL 8 MG PO TABS
ORAL_TABLET | ORAL | Status: AC
  Filled 2014-04-15: qty 1

## 2014-04-15 MED ORDER — METHYLPREDNISOLONE SODIUM SUCC 125 MG IJ SOLR
125.0000 mg | Freq: Once | INTRAMUSCULAR | Status: AC
  Administered 2014-04-15: 125 mg via INTRAVENOUS

## 2014-04-15 MED ORDER — ONDANSETRON HCL 8 MG PO TABS
8.0000 mg | ORAL_TABLET | Freq: Once | ORAL | Status: AC
  Administered 2014-04-15: 8 mg via ORAL

## 2014-04-15 MED ORDER — TEMAZEPAM 15 MG PO CAPS: 15.0000 mg | ORAL_CAPSULE | Freq: Every evening | ORAL | Status: DC | PRN

## 2014-04-15 MED ORDER — AZACITIDINE CHEMO SQ INJECTION
76.0000 mg/m2 | Freq: Once | INTRAMUSCULAR | Status: AC
  Administered 2014-04-15: 160 mg via SUBCUTANEOUS
  Filled 2014-04-15: qty 6.4

## 2014-04-15 MED ORDER — METHYLPREDNISOLONE SODIUM SUCC 125 MG IJ SOLR
INTRAMUSCULAR | Status: AC
  Filled 2014-04-15: qty 2

## 2014-04-15 MED ORDER — FAMOTIDINE IN NACL 20-0.9 MG/50ML-% IV SOLN
40.0000 mg | Freq: Once | INTRAVENOUS | Status: AC
  Administered 2014-04-15: 40 mg via INTRAVENOUS

## 2014-04-15 MED ORDER — FAMOTIDINE IN NACL 20-0.9 MG/50ML-% IV SOLN
INTRAVENOUS | Status: AC
  Filled 2014-04-15: qty 100

## 2014-04-15 NOTE — Patient Instructions (Signed)
Azacitidine suspension for injection (subcutaneous use) What is this medicine? AZACITIDINE (ay [place]) is a chemotherapy drug. This medicine reduces the growth of cancer cells and can suppress the immune system. It is used for treating myelodysplastic syndrome or some types of leukemia. This medicine may be used for other purposes; ask your health care provider or pharmacist if you have questions. COMMON BRAND NAME(S): Vidaza What should I tell my health care provider before I take this medicine? They need to know if you have any of these conditions: -infection (especially a virus infection such as chickenpox, cold sores, or herpes) -kidney disease -liver disease -liver tumors -an unusual or allergic reaction to azacitidine, mannitol, other medicines, foods, dyes, or preservatives -pregnant or trying to get pregnant -breast-feeding How should I use this medicine? This medicine is for injection under the skin. It is administered in a hospital or clinic by a specially trained health care professional. Talk to your pediatrician regarding the use of this medicine in children. While this drug may be prescribed for selected conditions, precautions do apply. Overdosage: If you think you have taken too much of this medicine contact a poison control center or emergency room at once. NOTE: This medicine is only for you. Do not share this medicine with others. What if I miss a dose? It is important not to miss your dose. Call your doctor or health care professional if you are unable to keep an appointment. What may interact with this medicine? -vaccines Talk to your doctor or health care professional before taking any of these medicines: -acetaminophen -aspirin -ibuprofen -ketoprofen -naproxen This list may not describe all possible interactions. Give your health care provider a list of all the medicines, herbs, non-prescription drugs, or dietary supplements you use. Also tell them if you  smoke, drink alcohol, or use illegal drugs. Some items may interact with your medicine. What should I watch for while using this medicine? Visit your doctor for checks on your progress. This drug may make you feel generally unwell. This is not uncommon, as chemotherapy can affect healthy cells as well as cancer cells. Report any side effects. Continue your course of treatment even though you feel ill unless your doctor tells you to stop. In some cases, you may be given additional medicines to help with side effects. Follow all directions for their use. Call your doctor or health care professional for advice if you get a fever, chills or sore throat, or other symptoms of a cold or flu. Do not treat yourself. This drug decreases your body's ability to fight infections. Try to avoid being around people who are sick. This medicine may increase your risk to bruise or bleed. Call your doctor or health care professional if you notice any unusual bleeding. Be careful brushing and flossing your teeth or using a toothpick because you may get an infection or bleed more easily. If you have any dental work done, tell your dentist you are receiving this medicine. Avoid taking products that contain aspirin, acetaminophen, ibuprofen, naproxen, or ketoprofen unless instructed by your doctor. These medicines may hide a fever. Do not have any vaccinations without your doctor's approval and avoid anyone who has recently had oral polio vaccine. Do not become pregnant while taking this medicine. Women should inform their doctor if they wish to become pregnant or think they might be pregnant. There is a potential for serious side effects to an unborn child. Talk to your health care professional or pharmacist for more information.  Do not breast-feed an infant while taking this medicine. If you are a man, you should not father a child while receiving treatment. What side effects may I notice from receiving this medicine? Side  effects that you should report to your doctor or health care professional as soon as possible: -allergic reactions like skin rash, itching or hives, swelling of the face, lips, or tongue -low blood counts - this medicine may decrease the number of white blood cells, red blood cells and platelets. You may be at increased risk for infections and bleeding. -signs of infection - fever or chills, cough, sore throat, pain or difficulty passing urine -signs of decreased platelets or bleeding - bruising, pinpoint red spots on the skin, black, tarry stools, blood in the urine -signs of decreased red blood cells - unusually weak or tired, fainting spells, lightheadedness -reactions at the injection site including redness, pain, itching, or bruising -breathing problems -changes in vision -fever -mouth sores -stomach pain -vomiting Side effects that usually do not require medical attention (report to your doctor or health care professional if they continue or are bothersome): -constipation -diarrhea -loss of appetite -nausea -pain or redness at the injection site -weak or tired This list may not describe all possible side effects. Call your doctor for medical advice about side effects. You may report side effects to FDA at 1-800-FDA-1088. Where should I keep my medicine? This drug is given in a hospital or clinic and will not be stored at home. NOTE: This sheet is a summary. It may not cover all possible information. If you have questions about this medicine, talk to your doctor, pharmacist, or health care provider.  2015, [employer]. (2007-03-26 11:04:07)

## 2014-04-15 NOTE — Telephone Encounter (Signed)
Explained to patient's wife that dr. Trula Slade is only here on mondays.  They decided to keep this appt.

## 2014-04-18 ENCOUNTER — Other Ambulatory Visit (HOSPITAL_BASED_OUTPATIENT_CLINIC_OR_DEPARTMENT_OTHER): Payer: Medicare Other

## 2014-04-18 DIAGNOSIS — D46Z Other myelodysplastic syndromes: Secondary | ICD-10-CM | POA: Diagnosis present

## 2014-04-18 LAB — CMP (CANCER CENTER ONLY)
ALT(SGPT): 34 U/L (ref 10–47)
AST: 21 U/L (ref 11–38)
Albumin: 3.6 g/dL (ref 3.3–5.5)
Alkaline Phosphatase: 73 U/L (ref 26–84)
BILIRUBIN TOTAL: 0.6 mg/dL (ref 0.20–1.60)
BUN, Bld: 26 mg/dL — ABNORMAL HIGH (ref 7–22)
CO2: 24 mEq/L (ref 18–33)
Calcium: 8.4 mg/dL (ref 8.0–10.3)
Chloride: 102 mEq/L (ref 98–108)
Creat: 1.3 mg/dl — ABNORMAL HIGH (ref 0.6–1.2)
GLUCOSE: 116 mg/dL (ref 73–118)
Potassium: 3.8 mEq/L (ref 3.3–4.7)
Sodium: 140 mEq/L (ref 128–145)
Total Protein: 7.9 g/dL (ref 6.4–8.1)

## 2014-04-18 LAB — MANUAL DIFFERENTIAL (CHCC SATELLITE)
ALC: 1.1 10*3/uL (ref 0.9–3.3)
ANC (CHCC HP manual diff): 3.2 10*3/uL (ref 1.5–6.5)
LYMPH: 22 % (ref 14–48)
MONO: 14 % — ABNORMAL HIGH (ref 0–13)
PLT EST ~~LOC~~: DECREASED
SEG: 64 % (ref 40–75)

## 2014-04-18 LAB — CBC WITH DIFFERENTIAL (CANCER CENTER ONLY)
HCT: 26.1 % — ABNORMAL LOW (ref 38.7–49.9)
HGB: 8.8 g/dL — ABNORMAL LOW (ref 13.0–17.1)
MCH: 30.2 pg (ref 28.0–33.4)
MCHC: 33.7 g/dL (ref 32.0–35.9)
MCV: 90 fL (ref 82–98)
PLATELETS: 132 10*3/uL — AB (ref 145–400)
RBC: 2.91 10*6/uL — ABNORMAL LOW (ref 4.20–5.70)
RDW: 17.8 % — AB (ref 11.1–15.7)
WBC: 5 10*3/uL (ref 4.0–10.0)

## 2014-04-18 LAB — CHCC SATELLITE - SMEAR

## 2014-04-19 LAB — CHROMOSOME ANALYSIS, BONE MARROW

## 2014-04-21 ENCOUNTER — Encounter: Payer: Self-pay | Admitting: Hematology & Oncology

## 2014-04-25 ENCOUNTER — Encounter: Payer: Self-pay | Admitting: Hematology & Oncology

## 2014-04-25 ENCOUNTER — Other Ambulatory Visit (HOSPITAL_BASED_OUTPATIENT_CLINIC_OR_DEPARTMENT_OTHER): Payer: Medicare Other

## 2014-04-25 DIAGNOSIS — D469 Myelodysplastic syndrome, unspecified: Secondary | ICD-10-CM | POA: Diagnosis present

## 2014-04-25 DIAGNOSIS — D46Z Other myelodysplastic syndromes: Secondary | ICD-10-CM

## 2014-04-25 LAB — CBC WITH DIFFERENTIAL (CANCER CENTER ONLY)
HCT: 23.8 % — ABNORMAL LOW (ref 38.7–49.9)
HEMOGLOBIN: 7.9 g/dL — AB (ref 13.0–17.1)
MCH: 29.8 pg (ref 28.0–33.4)
MCHC: 33.2 g/dL (ref 32.0–35.9)
MCV: 90 fL (ref 82–98)
PLATELETS: 105 10*3/uL — AB (ref 145–400)
RBC: 2.65 10*6/uL — AB (ref 4.20–5.70)
RDW: 18.1 % — ABNORMAL HIGH (ref 11.1–15.7)
WBC: 3.4 10*3/uL — ABNORMAL LOW (ref 4.0–10.0)

## 2014-04-25 LAB — MANUAL DIFFERENTIAL (CHCC SATELLITE)
ALC: 1 10*3/uL (ref 0.9–3.3)
ANC (CHCC HP manual diff): 2 10*3/uL (ref 1.5–6.5)
Band Neutrophils: 1 % (ref 0–10)
LYMPH: 30 % (ref 14–48)
MONO: 12 % (ref 0–13)
MYELOCYTES: 1 % — AB (ref 0–0)
PLT EST ~~LOC~~: DECREASED
SEG: 56 % (ref 40–75)
nRBC: 1 % — ABNORMAL HIGH (ref 0–0)

## 2014-05-02 ENCOUNTER — Ambulatory Visit (HOSPITAL_COMMUNITY)
Admission: RE | Admit: 2014-05-02 | Discharge: 2014-05-02 | Disposition: A | Discharge status: Home / Self Care | Payer: Medicare Other | Source: Ambulatory Visit | Attending: Hematology & Oncology | Admitting: Hematology & Oncology

## 2014-05-02 ENCOUNTER — Other Ambulatory Visit: Payer: Self-pay | Admitting: *Deleted

## 2014-05-02 ENCOUNTER — Other Ambulatory Visit (HOSPITAL_BASED_OUTPATIENT_CLINIC_OR_DEPARTMENT_OTHER): Payer: Medicare Other

## 2014-05-02 DIAGNOSIS — D46Z Other myelodysplastic syndromes: Secondary | ICD-10-CM

## 2014-05-02 LAB — MANUAL DIFFERENTIAL (CHCC SATELLITE)
ALC: 1.3 10*3/uL (ref 0.9–3.3)
ANC (CHCC HP manual diff): 2.1 10*3/uL (ref 1.5–6.5)
Band Neutrophils: 1 % (ref 0–10)
EOS: 1 % (ref 0–7)
LYMPH: 34 % (ref 14–48)
METAMYELOCYTES PCT: 1 % — AB (ref 0–0)
MONO: 10 % (ref 0–13)
PLT EST ~~LOC~~: DECREASED
SEG: 53 % (ref 40–75)

## 2014-05-02 LAB — CBC WITH DIFFERENTIAL (CANCER CENTER ONLY)
HCT: 21.5 % — ABNORMAL LOW (ref 38.7–49.9)
HGB: 7 g/dL — ABNORMAL LOW (ref 13.0–17.1)
MCH: 29.8 pg (ref 28.0–33.4)
MCHC: 32.6 g/dL (ref 32.0–35.9)
MCV: 92 fL (ref 82–98)
Platelets: 101 10*3/uL — ABNORMAL LOW (ref 145–400)
RBC: 2.35 10*6/uL — ABNORMAL LOW (ref 4.20–5.70)
RDW: 18.3 % — AB (ref 11.1–15.7)
WBC: 3.8 10*3/uL — ABNORMAL LOW (ref 4.0–10.0)

## 2014-05-03 ENCOUNTER — Other Ambulatory Visit: Payer: Self-pay | Admitting: *Deleted

## 2014-05-03 ENCOUNTER — Ambulatory Visit: Payer: Medicare Other

## 2014-05-03 DIAGNOSIS — D46Z Other myelodysplastic syndromes: Secondary | ICD-10-CM

## 2014-05-03 LAB — PREPARE RBC (CROSSMATCH)

## 2014-05-03 LAB — ABO/RH: ABO/RH(D): A POS

## 2014-05-04 ENCOUNTER — Encounter: Payer: Self-pay | Admitting: Hematology & Oncology

## 2014-05-04 ENCOUNTER — Ambulatory Visit (HOSPITAL_BASED_OUTPATIENT_CLINIC_OR_DEPARTMENT_OTHER): Payer: Medicare Other

## 2014-05-04 VITALS — BP 116/60 | HR 61 | Temp 98.1°F | Resp 20

## 2014-05-04 DIAGNOSIS — D46Z Other myelodysplastic syndromes: Secondary | ICD-10-CM | POA: Diagnosis not present

## 2014-05-04 DIAGNOSIS — D4620 Refractory anemia with excess of blasts, unspecified: Secondary | ICD-10-CM

## 2014-05-04 MED ORDER — SODIUM CHLORIDE 0.9 % IV SOLN
250.0000 mL | Freq: Once | INTRAVENOUS | Status: AC
  Administered 2014-05-04: 250 mL via INTRAVENOUS

## 2014-05-04 MED ORDER — DARBEPOETIN ALFA 300 MCG/0.6ML IJ SOSY
300.0000 ug | PREFILLED_SYRINGE | Freq: Once | INTRAMUSCULAR | Status: AC
  Administered 2014-05-04: 300 ug via SUBCUTANEOUS

## 2014-05-04 MED ORDER — DIPHENHYDRAMINE HCL 25 MG PO CAPS
25.0000 mg | ORAL_CAPSULE | Freq: Once | ORAL | Status: AC
  Administered 2014-05-04: 25 mg via ORAL

## 2014-05-04 MED ORDER — FUROSEMIDE 10 MG/ML IJ SOLN
INTRAMUSCULAR | Status: AC
  Filled 2014-05-04: qty 4

## 2014-05-04 MED ORDER — DIPHENHYDRAMINE HCL 25 MG PO CAPS
ORAL_CAPSULE | ORAL | Status: AC
  Filled 2014-05-04: qty 1

## 2014-05-04 MED ORDER — DARBEPOETIN ALFA 300 MCG/0.6ML IJ SOSY
PREFILLED_SYRINGE | INTRAMUSCULAR | Status: AC
  Filled 2014-05-04: qty 0.6

## 2014-05-04 MED ORDER — ACETAMINOPHEN 325 MG PO TABS
ORAL_TABLET | ORAL | Status: AC
  Filled 2014-05-04: qty 2

## 2014-05-04 MED ORDER — ACETAMINOPHEN 325 MG PO TABS
650.0000 mg | ORAL_TABLET | Freq: Once | ORAL | Status: AC
  Administered 2014-05-04: 650 mg via ORAL

## 2014-05-04 MED ORDER — FUROSEMIDE 10 MG/ML IJ SOLN
20.0000 mg | Freq: Once | INTRAMUSCULAR | Status: AC
  Administered 2014-05-04: 20 mg via INTRAVENOUS

## 2014-05-04 NOTE — Patient Instructions (Signed)

## 2014-05-05 LAB — TYPE AND SCREEN
ABO/RH(D): A POS
Antibody Screen: NEGATIVE
Unit division: 0
Unit division: 0

## 2014-05-06 ENCOUNTER — Encounter: Payer: Self-pay | Admitting: Surgery

## 2014-05-09 ENCOUNTER — Ambulatory Visit (HOSPITAL_BASED_OUTPATIENT_CLINIC_OR_DEPARTMENT_OTHER): Payer: Medicare Other | Admitting: Hematology & Oncology

## 2014-05-09 ENCOUNTER — Other Ambulatory Visit (HOSPITAL_BASED_OUTPATIENT_CLINIC_OR_DEPARTMENT_OTHER): Payer: Medicare Other

## 2014-05-09 ENCOUNTER — Encounter: Payer: Self-pay | Admitting: Hematology & Oncology

## 2014-05-09 ENCOUNTER — Ambulatory Visit (HOSPITAL_BASED_OUTPATIENT_CLINIC_OR_DEPARTMENT_OTHER): Payer: Medicare Other

## 2014-05-09 VITALS — BP 137/65 | HR 55 | Temp 98.3°F | Resp 18 | Ht 70.0 in | Wt 194.0 lb

## 2014-05-09 DIAGNOSIS — D46Z Other myelodysplastic syndromes: Secondary | ICD-10-CM

## 2014-05-09 DIAGNOSIS — D4620 Refractory anemia with excess of blasts, unspecified: Secondary | ICD-10-CM

## 2014-05-09 DIAGNOSIS — Z5111 Encounter for antineoplastic chemotherapy: Secondary | ICD-10-CM | POA: Diagnosis present

## 2014-05-09 DIAGNOSIS — D61818 Other pancytopenia: Secondary | ICD-10-CM

## 2014-05-09 LAB — MANUAL DIFFERENTIAL (CHCC SATELLITE)
ALC: 1.3 10*3/uL (ref 0.9–3.3)
ANC (CHCC HP manual diff): 2.2 10*3/uL (ref 1.5–6.5)
BAND NEUTROPHILS: 1 % (ref 0–10)
LYMPH: 34 % (ref 14–48)
MONO: 6 % (ref 0–13)
PLT EST ~~LOC~~: DECREASED
SEG: 58 % (ref 40–75)

## 2014-05-09 LAB — CBC WITH DIFFERENTIAL (CANCER CENTER ONLY)
HCT: 27.9 % — ABNORMAL LOW (ref 38.7–49.9)
HEMOGLOBIN: 9.2 g/dL — AB (ref 13.0–17.1)
MCH: 30 pg (ref 28.0–33.4)
MCHC: 33 g/dL (ref 32.0–35.9)
MCV: 91 fL (ref 82–98)
PLATELETS: 111 10*3/uL — AB (ref 145–400)
RBC: 3.07 10*6/uL — AB (ref 4.20–5.70)
RDW: 17.6 % — ABNORMAL HIGH (ref 11.1–15.7)
WBC: 3.8 10*3/uL — AB (ref 4.0–10.0)

## 2014-05-09 MED ORDER — PREDNISONE 20 MG PO TABS: ORAL_TABLET | ORAL | Status: DC

## 2014-05-09 MED ORDER — CLINDAMYCIN HCL 300 MG PO CAPS: ORAL_CAPSULE | ORAL | Status: DC

## 2014-05-09 MED ORDER — METHYLPREDNISOLONE SODIUM SUCC 125 MG IJ SOLR
125.0000 mg | Freq: Once | INTRAMUSCULAR | Status: AC
  Administered 2014-05-09: 125 mg via INTRAVENOUS

## 2014-05-09 MED ORDER — FAMOTIDINE IN NACL 20-0.9 MG/50ML-% IV SOLN
INTRAVENOUS | Status: AC
  Filled 2014-05-09: qty 100

## 2014-05-09 MED ORDER — FAMOTIDINE IN NACL 20-0.9 MG/50ML-% IV SOLN
40.0000 mg | Freq: Once | INTRAVENOUS | Status: AC
  Administered 2014-05-09: 40 mg via INTRAVENOUS

## 2014-05-09 MED ORDER — METHYLPREDNISOLONE SODIUM SUCC 125 MG IJ SOLR
INTRAMUSCULAR | Status: AC
  Filled 2014-05-09: qty 2

## 2014-05-09 MED ORDER — ONDANSETRON HCL 8 MG PO TABS
8.0000 mg | ORAL_TABLET | Freq: Once | ORAL | Status: AC
  Administered 2014-05-09: 8 mg via ORAL

## 2014-05-09 MED ORDER — AZACITIDINE CHEMO SQ INJECTION
76.0000 mg/m2 | Freq: Once | INTRAMUSCULAR | Status: AC
  Administered 2014-05-09: 160 mg via SUBCUTANEOUS
  Filled 2014-05-09: qty 6.4

## 2014-05-09 MED ORDER — ONDANSETRON HCL 8 MG PO TABS
ORAL_TABLET | ORAL | Status: AC
  Filled 2014-05-09: qty 1

## 2014-05-09 NOTE — Progress Notes (Signed)
Hematology and Oncology Follow Up Visit  [name] [id number] 12/23/1945 69 y.o. 05/09/2014   Principle Diagnosis:   RAEB-2 (normal cytogenetics with mutated ASXL-1, TET2, U2AF1 genes)  Current Therapy:    Status post cycle 1 of Vidaza  Aranesp 300 g subcutaneous as needed for hemoglobin less than 10     Interim History:  [name] is back for follow-up. He is doing pretty well. He was transfused on the 21st. He tolerated this well.  His erythropoietin level is only 133. As such, I started him on some Aranesp.  We did send off a molecular panel on his blood. He was found have some mutations in the above noted genes. These, in my mind, still put him in a low-intermediate category.  He feels well. He is doing things. He's had no nausea or vomiting. He's had no bleeding. He's had no change in bowel or bladder habits.  His appetite is good. He's had no nausea or vomiting.  He does need prednisone before each Vidaza. He still wants to do subcutaneous Vidaza.  He's had no fever. He's had no leg swelling.  Overall, his performance status is ECOG 1.  Medications:  Current outpatient prescriptions:  .  acetaminophen (TYLENOL) 325 MG tablet, Take 325 mg by mouth every 6 (six) hours as needed for moderate pain., Disp: , Rfl:  .  aspirin EC 81 MG tablet, Take 1 tablet (81 mg total) by mouth daily., Disp: , Rfl:  .  ferrous sulfate 325 (65 FE) MG EC tablet, Take 60 mg by mouth daily with breakfast. , Disp: , Rfl:  .  LORazepam (ATIVAN) 0.5 MG tablet, Take 1 tablet (0.5 mg total) by mouth every 6 (six) hours as needed (Nausea or vomiting)., Disp: 30 tablet, Rfl: 3 .  metoprolol tartrate (LOPRESSOR) 25 MG tablet, Take 0.5 tablets (12.5 mg total) by mouth 2 (two) times daily., Disp: 90 tablet, Rfl: 3 .  nitroGLYCERIN (NITROSTAT) 0.4 MG SL tablet, Place 1 tablet (0.4 mg total) under the tongue every 5 (five) minutes as needed for chest pain., Disp: 25 tablet, Rfl: 6 .  pantoprazole  (PROTONIX) 40 MG tablet, Take 40 mg by mouth daily., Disp: , Rfl:  .  simvastatin (ZOCOR) 20 MG tablet, Take 1 tablet (20 mg total) by mouth at bedtime., Disp: 30 tablet, Rfl: 5 .  temazepam (RESTORIL) 15 MG capsule, Take 1 capsule (15 mg total) by mouth at bedtime as needed for sleep., Disp: 30 capsule, Rfl: 0 .  prochlorperazine (COMPAZINE) 10 MG tablet, Take 1 tablet (10 mg total) by mouth every 6 (six) hours as needed (Nausea or vomiting). (Patient not taking: Reported on 05/09/2014), Disp: 30 tablet, Rfl: 1  Allergies:  Allergies  Allergen Reactions  . Vicodin [Hydrocodone-Acetaminophen] Itching  . Cephalexin Other (See Comments)    Nausea and stomach cramps  . Phenergan [Promethazine Hcl] Other (See Comments)    "restless legs"    Past Medical History, Surgical history, Social history, and Family History were reviewed and updated.  Review of Systems: As above  Physical Exam:  height is $RemoveB'5\' 10"'gbTNeRTb$  (1.778 m) and weight is 194 lb (87.998 kg). His oral temperature is 98.3 F (36.8 C). His blood pressure is 137/65 and his pulse is 55. His respiration is 18.   Wt Readings from Last 3 Encounters:  05/09/14 194 lb (87.998 kg)  04/06/14 195 lb (88.451 kg)  03/30/14 197 lb (89.359 kg)     Well-developed and well-nourished white gentleman in no obvious  distress. Head and neck exam shows no ocular or oral lesions. There are no palpable cervical or supraclavicular lymph does. Lungs are clear. Cardiac exam regular rate and rhythm with no murmurs, rubs or bruits. Abdomen is soft. Has good bowel sounds. There is no fluid wave. There is no palpable liver or spleen tip. Back exam shows no tenderness over the spine, ribs or hips. Extremities shows no clubbing, cyanosis or edema. Skin exam shows no rashes, ecchymoses or petechia. Neurological exam is nonfocal.  Lab Results  Component Value Date   WBC 3.8* 05/09/2014   HGB 9.2* 05/09/2014   HCT 27.9* 05/09/2014   MCV 91 05/09/2014   PLT 111*  05/09/2014     Chemistry      Component Value Date/Time   NA 140 04/18/2014 1204   NA 136 04/06/2014 1454   K 3.8 04/18/2014 1204   K 4.4 04/06/2014 1454   CL 102 04/18/2014 1204   CL 104 04/06/2014 1454   CO2 24 04/18/2014 1204   CO2 19 04/06/2014 1454   BUN 26* 04/18/2014 1204   BUN 24* 04/06/2014 1454   CREATININE 1.3* 04/18/2014 1204   CREATININE 1.30 04/06/2014 1454      Component Value Date/Time   CALCIUM 8.4 04/18/2014 1204   CALCIUM 9.3 04/06/2014 1454   ALKPHOS 73 04/18/2014 1204   ALKPHOS 99 04/06/2014 1454   AST 21 04/18/2014 1204   AST 21 04/06/2014 1454   ALT 34 04/18/2014 1204   ALT 18 04/06/2014 1454   BILITOT 0.60 04/18/2014 1204   BILITOT 0.5 04/06/2014 1454         Impression and Plan: [name] is 69 year old gentleman with myelodysplasia. He has normal cytogenetics. We did do the NGS of his blood we set show some occasions. Again, these might indicate an increased risk of transformation but it is not definitive.  Overall, we will see a response as we continue him on therapy.  He will receive his second cycle of Vidaza now.  We will still do weekly lab work.  He will have a bone marrow biopsy done after his fourth cycle of treatment.  I will see him back in one month.  I spent about 30 minutes with he and his wife.   Volanda Napoleon, MD 4/25/201612:32 PM

## 2014-05-09 NOTE — Addendum Note (Signed)
Addended by: Burney Gauze R on: 05/09/2014 01:27 PM   Modules accepted: Orders

## 2014-05-09 NOTE — Patient Instructions (Signed)
Azacitidine suspension for injection (subcutaneous use) What is this medicine? AZACITIDINE (ay [place]) is a chemotherapy drug. This medicine reduces the growth of cancer cells and can suppress the immune system. It is used for treating myelodysplastic syndrome or some types of leukemia. This medicine may be used for other purposes; ask your health care provider or pharmacist if you have questions. COMMON BRAND NAME(S): Vidaza What should I tell my health care provider before I take this medicine? They need to know if you have any of these conditions: -infection (especially a virus infection such as chickenpox, cold sores, or herpes) -kidney disease -liver disease -liver tumors -an unusual or allergic reaction to azacitidine, mannitol, other medicines, foods, dyes, or preservatives -pregnant or trying to get pregnant -breast-feeding How should I use this medicine? This medicine is for injection under the skin. It is administered in a hospital or clinic by a specially trained health care professional. Talk to your pediatrician regarding the use of this medicine in children. While this drug may be prescribed for selected conditions, precautions do apply. Overdosage: If you think you have taken too much of this medicine contact a poison control center or emergency room at once. NOTE: This medicine is only for you. Do not share this medicine with others. What if I miss a dose? It is important not to miss your dose. Call your doctor or health care professional if you are unable to keep an appointment. What may interact with this medicine? -vaccines Talk to your doctor or health care professional before taking any of these medicines: -acetaminophen -aspirin -ibuprofen -ketoprofen -naproxen This list may not describe all possible interactions. Give your health care provider a list of all the medicines, herbs, non-prescription drugs, or dietary supplements you use. Also tell them if you  smoke, drink alcohol, or use illegal drugs. Some items may interact with your medicine. What should I watch for while using this medicine? Visit your doctor for checks on your progress. This drug may make you feel generally unwell. This is not uncommon, as chemotherapy can affect healthy cells as well as cancer cells. Report any side effects. Continue your course of treatment even though you feel ill unless your doctor tells you to stop. In some cases, you may be given additional medicines to help with side effects. Follow all directions for their use. Call your doctor or health care professional for advice if you get a fever, chills or sore throat, or other symptoms of a cold or flu. Do not treat yourself. This drug decreases your body's ability to fight infections. Try to avoid being around people who are sick. This medicine may increase your risk to bruise or bleed. Call your doctor or health care professional if you notice any unusual bleeding. Be careful brushing and flossing your teeth or using a toothpick because you may get an infection or bleed more easily. If you have any dental work done, tell your dentist you are receiving this medicine. Avoid taking products that contain aspirin, acetaminophen, ibuprofen, naproxen, or ketoprofen unless instructed by your doctor. These medicines may hide a fever. Do not have any vaccinations without your doctor's approval and avoid anyone who has recently had oral polio vaccine. Do not become pregnant while taking this medicine. Women should inform their doctor if they wish to become pregnant or think they might be pregnant. There is a potential for serious side effects to an unborn child. Talk to your health care professional or pharmacist for more information.  Do not breast-feed an infant while taking this medicine. If you are a man, you should not father a child while receiving treatment. What side effects may I notice from receiving this medicine? Side  effects that you should report to your doctor or health care professional as soon as possible: -allergic reactions like skin rash, itching or hives, swelling of the face, lips, or tongue -low blood counts - this medicine may decrease the number of white blood cells, red blood cells and platelets. You may be at increased risk for infections and bleeding. -signs of infection - fever or chills, cough, sore throat, pain or difficulty passing urine -signs of decreased platelets or bleeding - bruising, pinpoint red spots on the skin, black, tarry stools, blood in the urine -signs of decreased red blood cells - unusually weak or tired, fainting spells, lightheadedness -reactions at the injection site including redness, pain, itching, or bruising -breathing problems -changes in vision -fever -mouth sores -stomach pain -vomiting Side effects that usually do not require medical attention (report to your doctor or health care professional if they continue or are bothersome): -constipation -diarrhea -loss of appetite -nausea -pain or redness at the injection site -weak or tired This list may not describe all possible side effects. Call your doctor for medical advice about side effects. You may report side effects to FDA at 1-800-FDA-1088. Where should I keep my medicine? This drug is given in a hospital or clinic and will not be stored at home. NOTE: This sheet is a summary. It may not cover all possible information. If you have questions about this medicine, talk to your doctor, pharmacist, or health care provider.  2015, [employer]. (2007-03-26 11:04:07)

## 2014-05-10 ENCOUNTER — Encounter: Payer: Self-pay | Admitting: Hematology & Oncology

## 2014-05-10 ENCOUNTER — Ambulatory Visit (HOSPITAL_BASED_OUTPATIENT_CLINIC_OR_DEPARTMENT_OTHER): Payer: Medicare Other

## 2014-05-10 VITALS — BP 115/59 | HR 63 | Temp 98.0°F | Resp 20

## 2014-05-10 DIAGNOSIS — Z5111 Encounter for antineoplastic chemotherapy: Secondary | ICD-10-CM | POA: Diagnosis present

## 2014-05-10 DIAGNOSIS — D61818 Other pancytopenia: Secondary | ICD-10-CM

## 2014-05-10 DIAGNOSIS — D4622 Refractory anemia with excess of blasts 2: Secondary | ICD-10-CM | POA: Diagnosis not present

## 2014-05-10 MED ORDER — METHYLPREDNISOLONE SODIUM SUCC 125 MG IJ SOLR
125.0000 mg | Freq: Once | INTRAMUSCULAR | Status: AC
  Administered 2014-05-10: 125 mg via INTRAVENOUS

## 2014-05-10 MED ORDER — FAMOTIDINE IN NACL 20-0.9 MG/50ML-% IV SOLN
INTRAVENOUS | Status: AC
  Filled 2014-05-10: qty 100

## 2014-05-10 MED ORDER — ONDANSETRON HCL 8 MG PO TABS
8.0000 mg | ORAL_TABLET | Freq: Once | ORAL | Status: AC
  Administered 2014-05-10: 8 mg via ORAL

## 2014-05-10 MED ORDER — FAMOTIDINE IN NACL 20-0.9 MG/50ML-% IV SOLN
40.0000 mg | Freq: Once | INTRAVENOUS | Status: AC
  Administered 2014-05-10: 40 mg via INTRAVENOUS

## 2014-05-10 MED ORDER — AZACITIDINE CHEMO SQ INJECTION
76.0000 mg/m2 | Freq: Once | INTRAMUSCULAR | Status: AC
  Administered 2014-05-10: 160 mg via SUBCUTANEOUS
  Filled 2014-05-10: qty 6.4

## 2014-05-10 MED ORDER — ONDANSETRON HCL 8 MG PO TABS
ORAL_TABLET | ORAL | Status: AC
  Filled 2014-05-10: qty 1

## 2014-05-10 MED ORDER — METHYLPREDNISOLONE SODIUM SUCC 125 MG IJ SOLR
INTRAMUSCULAR | Status: AC
Start: 2014-05-10 — End: 2014-05-10
  Filled 2014-05-10: qty 2

## 2014-05-10 NOTE — Patient Instructions (Signed)
Azacitidine suspension for injection (subcutaneous use) What is this medicine? AZACITIDINE (ay [place]) is a chemotherapy drug. This medicine reduces the growth of cancer cells and can suppress the immune system. It is used for treating myelodysplastic syndrome or some types of leukemia. This medicine may be used for other purposes; ask your health care provider or pharmacist if you have questions. COMMON BRAND NAME(S): Vidaza What should I tell my health care provider before I take this medicine? They need to know if you have any of these conditions: -infection (especially a virus infection such as chickenpox, cold sores, or herpes) -kidney disease -liver disease -liver tumors -an unusual or allergic reaction to azacitidine, mannitol, other medicines, foods, dyes, or preservatives -pregnant or trying to get pregnant -breast-feeding How should I use this medicine? This medicine is for injection under the skin. It is administered in a hospital or clinic by a specially trained health care professional. Talk to your pediatrician regarding the use of this medicine in children. While this drug may be prescribed for selected conditions, precautions do apply. Overdosage: If you think you have taken too much of this medicine contact a poison control center or emergency room at once. NOTE: This medicine is only for you. Do not share this medicine with others. What if I miss a dose? It is important not to miss your dose. Call your doctor or health care professional if you are unable to keep an appointment. What may interact with this medicine? -vaccines Talk to your doctor or health care professional before taking any of these medicines: -acetaminophen -aspirin -ibuprofen -ketoprofen -naproxen This list may not describe all possible interactions. Give your health care provider a list of all the medicines, herbs, non-prescription drugs, or dietary supplements you use. Also tell them if you  smoke, drink alcohol, or use illegal drugs. Some items may interact with your medicine. What should I watch for while using this medicine? Visit your doctor for checks on your progress. This drug may make you feel generally unwell. This is not uncommon, as chemotherapy can affect healthy cells as well as cancer cells. Report any side effects. Continue your course of treatment even though you feel ill unless your doctor tells you to stop. In some cases, you may be given additional medicines to help with side effects. Follow all directions for their use. Call your doctor or health care professional for advice if you get a fever, chills or sore throat, or other symptoms of a cold or flu. Do not treat yourself. This drug decreases your body's ability to fight infections. Try to avoid being around people who are sick. This medicine may increase your risk to bruise or bleed. Call your doctor or health care professional if you notice any unusual bleeding. Be careful brushing and flossing your teeth or using a toothpick because you may get an infection or bleed more easily. If you have any dental work done, tell your dentist you are receiving this medicine. Avoid taking products that contain aspirin, acetaminophen, ibuprofen, naproxen, or ketoprofen unless instructed by your doctor. These medicines may hide a fever. Do not have any vaccinations without your doctor's approval and avoid anyone who has recently had oral polio vaccine. Do not become pregnant while taking this medicine. Women should inform their doctor if they wish to become pregnant or think they might be pregnant. There is a potential for serious side effects to an unborn child. Talk to your health care professional or pharmacist for more information.  Do not breast-feed an infant while taking this medicine. If you are a man, you should not father a child while receiving treatment. What side effects may I notice from receiving this medicine? Side  effects that you should report to your doctor or health care professional as soon as possible: -allergic reactions like skin rash, itching or hives, swelling of the face, lips, or tongue -low blood counts - this medicine may decrease the number of white blood cells, red blood cells and platelets. You may be at increased risk for infections and bleeding. -signs of infection - fever or chills, cough, sore throat, pain or difficulty passing urine -signs of decreased platelets or bleeding - bruising, pinpoint red spots on the skin, black, tarry stools, blood in the urine -signs of decreased red blood cells - unusually weak or tired, fainting spells, lightheadedness -reactions at the injection site including redness, pain, itching, or bruising -breathing problems -changes in vision -fever -mouth sores -stomach pain -vomiting Side effects that usually do not require medical attention (report to your doctor or health care professional if they continue or are bothersome): -constipation -diarrhea -loss of appetite -nausea -pain or redness at the injection site -weak or tired This list may not describe all possible side effects. Call your doctor for medical advice about side effects. You may report side effects to FDA at 1-800-FDA-1088. Where should I keep my medicine? This drug is given in a hospital or clinic and will not be stored at home. NOTE: This sheet is a summary. It may not cover all possible information. If you have questions about this medicine, talk to your doctor, pharmacist, or health care provider.  2015, [employer]. (2007-03-26 11:04:07)

## 2014-05-11 ENCOUNTER — Encounter: Payer: Self-pay | Admitting: Hematology & Oncology

## 2014-05-11 ENCOUNTER — Ambulatory Visit (HOSPITAL_BASED_OUTPATIENT_CLINIC_OR_DEPARTMENT_OTHER): Payer: Medicare Other

## 2014-05-11 VITALS — BP 124/51 | HR 56 | Temp 98.1°F | Resp 20

## 2014-05-11 DIAGNOSIS — Z5111 Encounter for antineoplastic chemotherapy: Secondary | ICD-10-CM

## 2014-05-11 DIAGNOSIS — D4620 Refractory anemia with excess of blasts, unspecified: Secondary | ICD-10-CM

## 2014-05-11 DIAGNOSIS — D61818 Other pancytopenia: Secondary | ICD-10-CM

## 2014-05-11 MED ORDER — METHYLPREDNISOLONE SODIUM SUCC 125 MG IJ SOLR
INTRAMUSCULAR | Status: AC
  Filled 2014-05-11: qty 2

## 2014-05-11 MED ORDER — FAMOTIDINE IN NACL 20-0.9 MG/50ML-% IV SOLN
40.0000 mg | Freq: Once | INTRAVENOUS | Status: AC
  Administered 2014-05-11: 40 mg via INTRAVENOUS

## 2014-05-11 MED ORDER — ONDANSETRON HCL 8 MG PO TABS
8.0000 mg | ORAL_TABLET | Freq: Once | ORAL | Status: AC
  Administered 2014-05-11: 8 mg via ORAL

## 2014-05-11 MED ORDER — ONDANSETRON HCL 8 MG PO TABS
ORAL_TABLET | ORAL | Status: AC
  Filled 2014-05-11: qty 1

## 2014-05-11 MED ORDER — METHYLPREDNISOLONE SODIUM SUCC 125 MG IJ SOLR
125.0000 mg | Freq: Once | INTRAMUSCULAR | Status: AC
  Administered 2014-05-11: 125 mg via INTRAVENOUS

## 2014-05-11 MED ORDER — AZACITIDINE CHEMO SQ INJECTION
76.0000 mg/m2 | Freq: Once | INTRAMUSCULAR | Status: AC
Start: 2014-05-11 — End: 2014-05-11
  Administered 2014-05-11: 160 mg via SUBCUTANEOUS
  Filled 2014-05-11: qty 6.4

## 2014-05-11 MED ORDER — FAMOTIDINE IN NACL 20-0.9 MG/50ML-% IV SOLN
INTRAVENOUS | Status: AC
  Filled 2014-05-11: qty 100

## 2014-05-11 NOTE — Patient Instructions (Signed)
Azacitidine suspension for injection (subcutaneous use) What is this medicine? AZACITIDINE (ay [place]) is a chemotherapy drug. This medicine reduces the growth of cancer cells and can suppress the immune system. It is used for treating myelodysplastic syndrome or some types of leukemia. This medicine may be used for other purposes; ask your health care provider or pharmacist if you have questions. COMMON BRAND NAME(S): Vidaza What should I tell my health care provider before I take this medicine? They need to know if you have any of these conditions: -infection (especially a virus infection such as chickenpox, cold sores, or herpes) -kidney disease -liver disease -liver tumors -an unusual or allergic reaction to azacitidine, mannitol, other medicines, foods, dyes, or preservatives -pregnant or trying to get pregnant -breast-feeding How should I use this medicine? This medicine is for injection under the skin. It is administered in a hospital or clinic by a specially trained health care professional. Talk to your pediatrician regarding the use of this medicine in children. While this drug may be prescribed for selected conditions, precautions do apply. Overdosage: If you think you have taken too much of this medicine contact a poison control center or emergency room at once. NOTE: This medicine is only for you. Do not share this medicine with others. What if I miss a dose? It is important not to miss your dose. Call your doctor or health care professional if you are unable to keep an appointment. What may interact with this medicine? -vaccines Talk to your doctor or health care professional before taking any of these medicines: -acetaminophen -aspirin -ibuprofen -ketoprofen -naproxen This list may not describe all possible interactions. Give your health care provider a list of all the medicines, herbs, non-prescription drugs, or dietary supplements you use. Also tell them if you  smoke, drink alcohol, or use illegal drugs. Some items may interact with your medicine. What should I watch for while using this medicine? Visit your doctor for checks on your progress. This drug may make you feel generally unwell. This is not uncommon, as chemotherapy can affect healthy cells as well as cancer cells. Report any side effects. Continue your course of treatment even though you feel ill unless your doctor tells you to stop. In some cases, you may be given additional medicines to help with side effects. Follow all directions for their use. Call your doctor or health care professional for advice if you get a fever, chills or sore throat, or other symptoms of a cold or flu. Do not treat yourself. This drug decreases your body's ability to fight infections. Try to avoid being around people who are sick. This medicine may increase your risk to bruise or bleed. Call your doctor or health care professional if you notice any unusual bleeding. Be careful brushing and flossing your teeth or using a toothpick because you may get an infection or bleed more easily. If you have any dental work done, tell your dentist you are receiving this medicine. Avoid taking products that contain aspirin, acetaminophen, ibuprofen, naproxen, or ketoprofen unless instructed by your doctor. These medicines may hide a fever. Do not have any vaccinations without your doctor's approval and avoid anyone who has recently had oral polio vaccine. Do not become pregnant while taking this medicine. Women should inform their doctor if they wish to become pregnant or think they might be pregnant. There is a potential for serious side effects to an unborn child. Talk to your health care professional or pharmacist for more information.  Do not breast-feed an infant while taking this medicine. If you are a man, you should not father a child while receiving treatment. What side effects may I notice from receiving this medicine? Side  effects that you should report to your doctor or health care professional as soon as possible: -allergic reactions like skin rash, itching or hives, swelling of the face, lips, or tongue -low blood counts - this medicine may decrease the number of white blood cells, red blood cells and platelets. You may be at increased risk for infections and bleeding. -signs of infection - fever or chills, cough, sore throat, pain or difficulty passing urine -signs of decreased platelets or bleeding - bruising, pinpoint red spots on the skin, black, tarry stools, blood in the urine -signs of decreased red blood cells - unusually weak or tired, fainting spells, lightheadedness -reactions at the injection site including redness, pain, itching, or bruising -breathing problems -changes in vision -fever -mouth sores -stomach pain -vomiting Side effects that usually do not require medical attention (report to your doctor or health care professional if they continue or are bothersome): -constipation -diarrhea -loss of appetite -nausea -pain or redness at the injection site -weak or tired This list may not describe all possible side effects. Call your doctor for medical advice about side effects. You may report side effects to FDA at 1-800-FDA-1088. Where should I keep my medicine? This drug is given in a hospital or clinic and will not be stored at home. NOTE: This sheet is a summary. It may not cover all possible information. If you have questions about this medicine, talk to your doctor, pharmacist, or health care provider.  2015, [employer]. (2007-03-26 11:04:07)

## 2014-05-12 ENCOUNTER — Ambulatory Visit (HOSPITAL_BASED_OUTPATIENT_CLINIC_OR_DEPARTMENT_OTHER): Payer: Medicare Other

## 2014-05-12 VITALS — BP 125/70 | HR 58 | Temp 97.8°F | Resp 20

## 2014-05-12 DIAGNOSIS — Z5111 Encounter for antineoplastic chemotherapy: Secondary | ICD-10-CM | POA: Diagnosis present

## 2014-05-12 DIAGNOSIS — D4620 Refractory anemia with excess of blasts, unspecified: Secondary | ICD-10-CM | POA: Diagnosis not present

## 2014-05-12 DIAGNOSIS — D61818 Other pancytopenia: Secondary | ICD-10-CM

## 2014-05-12 MED ORDER — METHYLPREDNISOLONE SODIUM SUCC 125 MG IJ SOLR
125.0000 mg | Freq: Once | INTRAMUSCULAR | Status: AC
  Administered 2014-05-12: 125 mg via INTRAVENOUS

## 2014-05-12 MED ORDER — AZACITIDINE CHEMO SQ INJECTION
76.0000 mg/m2 | Freq: Once | INTRAMUSCULAR | Status: AC
  Administered 2014-05-12: 160 mg via SUBCUTANEOUS
  Filled 2014-05-12: qty 6.4

## 2014-05-12 MED ORDER — METHYLPREDNISOLONE SODIUM SUCC 125 MG IJ SOLR
INTRAMUSCULAR | Status: AC
  Filled 2014-05-12: qty 2

## 2014-05-12 MED ORDER — FAMOTIDINE IN NACL 20-0.9 MG/50ML-% IV SOLN
INTRAVENOUS | Status: AC
  Filled 2014-05-12: qty 100

## 2014-05-12 MED ORDER — ONDANSETRON HCL 8 MG PO TABS
ORAL_TABLET | ORAL | Status: AC
  Filled 2014-05-12: qty 1

## 2014-05-12 MED ORDER — ONDANSETRON HCL 8 MG PO TABS
8.0000 mg | ORAL_TABLET | Freq: Once | ORAL | Status: AC
  Administered 2014-05-12: 8 mg via ORAL

## 2014-05-12 MED ORDER — FAMOTIDINE IN NACL 20-0.9 MG/50ML-% IV SOLN
40.0000 mg | Freq: Once | INTRAVENOUS | Status: AC
  Administered 2014-05-12: 40 mg via INTRAVENOUS

## 2014-05-12 NOTE — Patient Instructions (Signed)
Azacitidine suspension for injection (subcutaneous use) What is this medicine? AZACITIDINE (ay [place]) is a chemotherapy drug. This medicine reduces the growth of cancer cells and can suppress the immune system. It is used for treating myelodysplastic syndrome or some types of leukemia. This medicine may be used for other purposes; ask your health care provider or pharmacist if you have questions. COMMON BRAND NAME(S): Vidaza What should I tell my health care provider before I take this medicine? They need to know if you have any of these conditions: -infection (especially a virus infection such as chickenpox, cold sores, or herpes) -kidney disease -liver disease -liver tumors -an unusual or allergic reaction to azacitidine, mannitol, other medicines, foods, dyes, or preservatives -pregnant or trying to get pregnant -breast-feeding How should I use this medicine? This medicine is for injection under the skin. It is administered in a hospital or clinic by a specially trained health care professional. Talk to your pediatrician regarding the use of this medicine in children. While this drug may be prescribed for selected conditions, precautions do apply. Overdosage: If you think you have taken too much of this medicine contact a poison control center or emergency room at once. NOTE: This medicine is only for you. Do not share this medicine with others. What if I miss a dose? It is important not to miss your dose. Call your doctor or health care professional if you are unable to keep an appointment. What may interact with this medicine? -vaccines Talk to your doctor or health care professional before taking any of these medicines: -acetaminophen -aspirin -ibuprofen -ketoprofen -naproxen This list may not describe all possible interactions. Give your health care provider a list of all the medicines, herbs, non-prescription drugs, or dietary supplements you use. Also tell them if you  smoke, drink alcohol, or use illegal drugs. Some items may interact with your medicine. What should I watch for while using this medicine? Visit your doctor for checks on your progress. This drug may make you feel generally unwell. This is not uncommon, as chemotherapy can affect healthy cells as well as cancer cells. Report any side effects. Continue your course of treatment even though you feel ill unless your doctor tells you to stop. In some cases, you may be given additional medicines to help with side effects. Follow all directions for their use. Call your doctor or health care professional for advice if you get a fever, chills or sore throat, or other symptoms of a cold or flu. Do not treat yourself. This drug decreases your body's ability to fight infections. Try to avoid being around people who are sick. This medicine may increase your risk to bruise or bleed. Call your doctor or health care professional if you notice any unusual bleeding. Be careful brushing and flossing your teeth or using a toothpick because you may get an infection or bleed more easily. If you have any dental work done, tell your dentist you are receiving this medicine. Avoid taking products that contain aspirin, acetaminophen, ibuprofen, naproxen, or ketoprofen unless instructed by your doctor. These medicines may hide a fever. Do not have any vaccinations without your doctor's approval and avoid anyone who has recently had oral polio vaccine. Do not become pregnant while taking this medicine. Women should inform their doctor if they wish to become pregnant or think they might be pregnant. There is a potential for serious side effects to an unborn child. Talk to your health care professional or pharmacist for more information.  Do not breast-feed an infant while taking this medicine. If you are a man, you should not father a child while receiving treatment. What side effects may I notice from receiving this medicine? Side  effects that you should report to your doctor or health care professional as soon as possible: -allergic reactions like skin rash, itching or hives, swelling of the face, lips, or tongue -low blood counts - this medicine may decrease the number of white blood cells, red blood cells and platelets. You may be at increased risk for infections and bleeding. -signs of infection - fever or chills, cough, sore throat, pain or difficulty passing urine -signs of decreased platelets or bleeding - bruising, pinpoint red spots on the skin, black, tarry stools, blood in the urine -signs of decreased red blood cells - unusually weak or tired, fainting spells, lightheadedness -reactions at the injection site including redness, pain, itching, or bruising -breathing problems -changes in vision -fever -mouth sores -stomach pain -vomiting Side effects that usually do not require medical attention (report to your doctor or health care professional if they continue or are bothersome): -constipation -diarrhea -loss of appetite -nausea -pain or redness at the injection site -weak or tired This list may not describe all possible side effects. Call your doctor for medical advice about side effects. You may report side effects to FDA at 1-800-FDA-1088. Where should I keep my medicine? This drug is given in a hospital or clinic and will not be stored at home. NOTE: This sheet is a summary. It may not cover all possible information. If you have questions about this medicine, talk to your doctor, pharmacist, or health care provider.  2015, [employer]. (2007-03-26 11:04:07)

## 2014-05-13 ENCOUNTER — Ambulatory Visit (HOSPITAL_BASED_OUTPATIENT_CLINIC_OR_DEPARTMENT_OTHER): Payer: Medicare Other

## 2014-05-13 VITALS — BP 137/66 | HR 48 | Resp 18

## 2014-05-13 DIAGNOSIS — D4622 Refractory anemia with excess of blasts 2: Secondary | ICD-10-CM | POA: Diagnosis not present

## 2014-05-13 DIAGNOSIS — Z5111 Encounter for antineoplastic chemotherapy: Secondary | ICD-10-CM | POA: Diagnosis present

## 2014-05-13 DIAGNOSIS — D61818 Other pancytopenia: Secondary | ICD-10-CM

## 2014-05-13 MED ORDER — FAMOTIDINE IN NACL 20-0.9 MG/50ML-% IV SOLN
40.0000 mg | Freq: Once | INTRAVENOUS | Status: AC
  Administered 2014-05-13: 40 mg via INTRAVENOUS

## 2014-05-13 MED ORDER — FAMOTIDINE IN NACL 20-0.9 MG/50ML-% IV SOLN
INTRAVENOUS | Status: AC
  Filled 2014-05-13: qty 100

## 2014-05-13 MED ORDER — ONDANSETRON HCL 8 MG PO TABS
ORAL_TABLET | ORAL | Status: AC
  Filled 2014-05-13: qty 1

## 2014-05-13 MED ORDER — ONDANSETRON HCL 8 MG PO TABS
8.0000 mg | ORAL_TABLET | Freq: Once | ORAL | Status: AC
Start: 2014-05-13 — End: 2014-05-13
  Administered 2014-05-13: 8 mg via ORAL

## 2014-05-13 MED ORDER — AZACITIDINE CHEMO SQ INJECTION
160.0000 mg | Freq: Once | INTRAMUSCULAR | Status: AC
  Administered 2014-05-13: 160 mg via SUBCUTANEOUS
  Filled 2014-05-13: qty 6.4

## 2014-05-13 MED ORDER — METHYLPREDNISOLONE SODIUM SUCC 125 MG IJ SOLR
125.0000 mg | Freq: Once | INTRAMUSCULAR | Status: AC
  Administered 2014-05-13: 125 mg via INTRAVENOUS

## 2014-05-13 MED ORDER — METHYLPREDNISOLONE SODIUM SUCC 125 MG IJ SOLR
INTRAMUSCULAR | Status: AC
  Filled 2014-05-13: qty 2

## 2014-05-13 NOTE — Patient Instructions (Signed)
Azacitidine suspension for injection (subcutaneous use) What is this medicine? AZACITIDINE (ay [place]) is a chemotherapy drug. This medicine reduces the growth of cancer cells and can suppress the immune system. It is used for treating myelodysplastic syndrome or some types of leukemia. This medicine may be used for other purposes; ask your health care provider or pharmacist if you have questions. COMMON BRAND NAME(S): Vidaza What should I tell my health care provider before I take this medicine? They need to know if you have any of these conditions: -infection (especially a virus infection such as chickenpox, cold sores, or herpes) -kidney disease -liver disease -liver tumors -an unusual or allergic reaction to azacitidine, mannitol, other medicines, foods, dyes, or preservatives -pregnant or trying to get pregnant -breast-feeding How should I use this medicine? This medicine is for injection under the skin. It is administered in a hospital or clinic by a specially trained health care professional. Talk to your pediatrician regarding the use of this medicine in children. While this drug may be prescribed for selected conditions, precautions do apply. Overdosage: If you think you have taken too much of this medicine contact a poison control center or emergency room at once. NOTE: This medicine is only for you. Do not share this medicine with others. What if I miss a dose? It is important not to miss your dose. Call your doctor or health care professional if you are unable to keep an appointment. What may interact with this medicine? -vaccines Talk to your doctor or health care professional before taking any of these medicines: -acetaminophen -aspirin -ibuprofen -ketoprofen -naproxen This list may not describe all possible interactions. Give your health care provider a list of all the medicines, herbs, non-prescription drugs, or dietary supplements you use. Also tell them if you  smoke, drink alcohol, or use illegal drugs. Some items may interact with your medicine. What should I watch for while using this medicine? Visit your doctor for checks on your progress. This drug may make you feel generally unwell. This is not uncommon, as chemotherapy can affect healthy cells as well as cancer cells. Report any side effects. Continue your course of treatment even though you feel ill unless your doctor tells you to stop. In some cases, you may be given additional medicines to help with side effects. Follow all directions for their use. Call your doctor or health care professional for advice if you get a fever, chills or sore throat, or other symptoms of a cold or flu. Do not treat yourself. This drug decreases your body's ability to fight infections. Try to avoid being around people who are sick. This medicine may increase your risk to bruise or bleed. Call your doctor or health care professional if you notice any unusual bleeding. Be careful brushing and flossing your teeth or using a toothpick because you may get an infection or bleed more easily. If you have any dental work done, tell your dentist you are receiving this medicine. Avoid taking products that contain aspirin, acetaminophen, ibuprofen, naproxen, or ketoprofen unless instructed by your doctor. These medicines may hide a fever. Do not have any vaccinations without your doctor's approval and avoid anyone who has recently had oral polio vaccine. Do not become pregnant while taking this medicine. Women should inform their doctor if they wish to become pregnant or think they might be pregnant. There is a potential for serious side effects to an unborn child. Talk to your health care professional or pharmacist for more information.  Do not breast-feed an infant while taking this medicine. If you are a man, you should not father a child while receiving treatment. What side effects may I notice from receiving this medicine? Side  effects that you should report to your doctor or health care professional as soon as possible: -allergic reactions like skin rash, itching or hives, swelling of the face, lips, or tongue -low blood counts - this medicine may decrease the number of white blood cells, red blood cells and platelets. You may be at increased risk for infections and bleeding. -signs of infection - fever or chills, cough, sore throat, pain or difficulty passing urine -signs of decreased platelets or bleeding - bruising, pinpoint red spots on the skin, black, tarry stools, blood in the urine -signs of decreased red blood cells - unusually weak or tired, fainting spells, lightheadedness -reactions at the injection site including redness, pain, itching, or bruising -breathing problems -changes in vision -fever -mouth sores -stomach pain -vomiting Side effects that usually do not require medical attention (report to your doctor or health care professional if they continue or are bothersome): -constipation -diarrhea -loss of appetite -nausea -pain or redness at the injection site -weak or tired This list may not describe all possible side effects. Call your doctor for medical advice about side effects. You may report side effects to FDA at 1-800-FDA-1088. Where should I keep my medicine? This drug is given in a hospital or clinic and will not be stored at home. NOTE: This sheet is a summary. It may not cover all possible information. If you have questions about this medicine, talk to your doctor, pharmacist, or health care provider.  2015, [employer]. (2007-03-26 11:04:07)

## 2014-05-15 ENCOUNTER — Encounter: Payer: Self-pay | Admitting: Cardiovascular Disease

## 2014-05-16 ENCOUNTER — Other Ambulatory Visit: Payer: Medicare Other

## 2014-05-17 ENCOUNTER — Other Ambulatory Visit (HOSPITAL_BASED_OUTPATIENT_CLINIC_OR_DEPARTMENT_OTHER): Payer: Medicare Other

## 2014-05-17 ENCOUNTER — Telehealth: Payer: Self-pay | Admitting: *Deleted

## 2014-05-17 DIAGNOSIS — D46Z Other myelodysplastic syndromes: Secondary | ICD-10-CM

## 2014-05-17 DIAGNOSIS — D4620 Refractory anemia with excess of blasts, unspecified: Secondary | ICD-10-CM

## 2014-05-17 LAB — MANUAL DIFFERENTIAL (CHCC SATELLITE)
ALC: 1.3 10*3/uL (ref 0.9–3.3)
ANC (CHCC MAN DIFF): 2.8 10*3/uL (ref 1.5–6.5)
BAND NEUTROPHILS: 2 % (ref 0–10)
Eos: 1 % (ref 0–7)
LYMPH: 28 % (ref 14–48)
MONO: 9 % (ref 0–13)
PLT EST ~~LOC~~: DECREASED
SEG: 60 % (ref 40–75)

## 2014-05-17 LAB — CBC WITH DIFFERENTIAL (CANCER CENTER ONLY)
HCT: 25.2 % — ABNORMAL LOW (ref 38.7–49.9)
HGB: 8.2 g/dL — ABNORMAL LOW (ref 13.0–17.1)
MCH: 29.7 pg (ref 28.0–33.4)
MCHC: 32.5 g/dL (ref 32.0–35.9)
MCV: 91 fL (ref 82–98)
Platelets: 145 10*3/uL (ref 145–400)
RBC: 2.76 10*6/uL — ABNORMAL LOW (ref 4.20–5.70)
RDW: 18 % — ABNORMAL HIGH (ref 11.1–15.7)
WBC: 4.5 10*3/uL (ref 4.0–10.0)

## 2014-05-17 NOTE — Telephone Encounter (Signed)
Patient called for lab results. Today's lab results given to patient and his wife.

## 2014-05-23 ENCOUNTER — Other Ambulatory Visit (HOSPITAL_BASED_OUTPATIENT_CLINIC_OR_DEPARTMENT_OTHER): Payer: Medicare Other

## 2014-05-23 ENCOUNTER — Other Ambulatory Visit (HOSPITAL_COMMUNITY): Payer: Medicare Other

## 2014-05-23 ENCOUNTER — Other Ambulatory Visit: Payer: Medicare Other

## 2014-05-23 ENCOUNTER — Ambulatory Visit: Payer: Medicare Other | Admitting: Surgery

## 2014-05-23 ENCOUNTER — Ambulatory Visit (HOSPITAL_BASED_OUTPATIENT_CLINIC_OR_DEPARTMENT_OTHER): Payer: Medicare Other

## 2014-05-23 VITALS — BP 118/68 | HR 58 | Temp 98.3°F | Resp 18

## 2014-05-23 DIAGNOSIS — D4622 Refractory anemia with excess of blasts 2: Secondary | ICD-10-CM

## 2014-05-23 DIAGNOSIS — D4620 Refractory anemia with excess of blasts, unspecified: Secondary | ICD-10-CM

## 2014-05-23 DIAGNOSIS — D46Z Other myelodysplastic syndromes: Secondary | ICD-10-CM

## 2014-05-23 LAB — MANUAL DIFFERENTIAL (CHCC SATELLITE)
ALC: 1.1 10*3/uL (ref 0.9–3.3)
ANC (CHCC HP manual diff): 1.6 10*3/uL (ref 1.5–6.5)
BAND NEUTROPHILS: 2 % (ref 0–10)
BASO: 1 % (ref 0–2)
LYMPH: 34 % (ref 14–48)
MONO: 16 % — AB (ref 0–13)
Myelocytes: 1 % — ABNORMAL HIGH (ref 0–0)
PLT EST ~~LOC~~: DECREASED
SEG: 46 % (ref 40–75)

## 2014-05-23 LAB — CBC WITH DIFFERENTIAL (CANCER CENTER ONLY)
HCT: 23.2 % — ABNORMAL LOW (ref 38.7–49.9)
HEMOGLOBIN: 7.6 g/dL — AB (ref 13.0–17.1)
MCH: 30 pg (ref 28.0–33.4)
MCHC: 32.8 g/dL (ref 32.0–35.9)
MCV: 92 fL (ref 82–98)
Platelets: 90 10*3/uL — ABNORMAL LOW (ref 145–400)
RBC: 2.53 10*6/uL — ABNORMAL LOW (ref 4.20–5.70)
RDW: 17.9 % — AB (ref 11.1–15.7)
WBC: 3.3 10*3/uL — AB (ref 4.0–10.0)

## 2014-05-23 MED ORDER — DARBEPOETIN ALFA 300 MCG/0.6ML IJ SOSY
300.0000 ug | PREFILLED_SYRINGE | Freq: Once | INTRAMUSCULAR | Status: AC
  Administered 2014-05-23: 300 ug via SUBCUTANEOUS

## 2014-05-23 MED ORDER — DARBEPOETIN ALFA 300 MCG/0.6ML IJ SOSY
PREFILLED_SYRINGE | INTRAMUSCULAR | Status: AC
  Filled 2014-05-23: qty 0.6

## 2014-05-23 NOTE — Patient Instructions (Signed)
Darbepoetin Alfa injection What is this medicine? DARBEPOETIN ALFA (dar be POE e tin AL fa) helps your body make more red blood cells. It is used to treat anemia caused by chronic kidney failure and chemotherapy. This medicine may be used for other purposes; ask your health care provider or pharmacist if you have questions. COMMON BRAND NAME(S): Aranesp What should I tell my health care provider before I take this medicine? They need to know if you have any of these conditions: -blood clotting disorders or history of blood clots -cancer patient not on chemotherapy -cystic fibrosis -heart disease, such as angina, heart failure, or a history of a heart attack -hemoglobin level of 12 g/dL or greater -high blood pressure -low levels of folate, iron, or vitamin B12 -seizures -an unusual or allergic reaction to darbepoetin, erythropoietin, albumin, hamster proteins, latex, other medicines, foods, dyes, or preservatives -pregnant or trying to get pregnant -breast-feeding How should I use this medicine? This medicine is for injection into a vein or under the skin. It is usually given by a health care professional in a hospital or clinic setting. If you get this medicine at home, you will be taught how to prepare and give this medicine. Do not shake the solution before you withdraw a dose. Use exactly as directed. Take your medicine at regular intervals. Do not take your medicine more often than directed. It is important that you put your used needles and syringes in a special sharps container. Do not put them in a trash can. If you do not have a sharps container, call your pharmacist or healthcare provider to get one. Talk to your pediatrician regarding the use of this medicine in children. While this medicine may be used in children as young as 1 year for selected conditions, precautions do apply. Overdosage: If you think you have taken too much of this medicine contact a poison control center or  emergency room at once. NOTE: This medicine is only for you. Do not share this medicine with others. What if I miss a dose? If you miss a dose, take it as soon as you can. If it is almost time for your next dose, take only that dose. Do not take double or extra doses. What may interact with this medicine? Do not take this medicine with any of the following medications: -epoetin alfa This list may not describe all possible interactions. Give your health care provider a list of all the medicines, herbs, non-prescription drugs, or dietary supplements you use. Also tell them if you smoke, drink alcohol, or use illegal drugs. Some items may interact with your medicine. What should I watch for while using this medicine? Visit your prescriber or health care professional for regular checks on your progress and for the needed blood tests and blood pressure measurements. It is especially important for the doctor to make sure your hemoglobin level is in the desired range, to limit the risk of potential side effects and to give you the best benefit. Keep all appointments for any recommended tests. Check your blood pressure as directed. Ask your doctor what your blood pressure should be and when you should contact him or her. As your body makes more red blood cells, you may need to take iron, folic acid, or vitamin B supplements. Ask your doctor or health care provider which products are right for you. If you have kidney disease continue dietary restrictions, even though this medication can make you feel better. Talk with your doctor or health   care professional about the foods you eat and the vitamins that you take. What side effects may I notice from receiving this medicine? Side effects that you should report to your doctor or health care professional as soon as possible: -allergic reactions like skin rash, itching or hives, swelling of the face, lips, or tongue -breathing problems -changes in vision -chest  pain -confusion, trouble speaking or understanding -feeling faint or lightheaded, falls -high blood pressure -muscle aches or pains -pain, swelling, warmth in the leg -rapid weight gain -severe headaches -sudden numbness or weakness of the face, arm or leg -trouble walking, dizziness, loss of balance or coordination -seizures (convulsions) -swelling of the ankles, feet, hands -unusually weak or tired Side effects that usually do not require medical attention (report to your doctor or health care professional if they continue or are bothersome): -diarrhea -fever, chills (flu-like symptoms) -headaches -nausea, vomiting -redness, stinging, or swelling at site where injected This list may not describe all possible side effects. Call your doctor for medical advice about side effects. You may report side effects to FDA at 1-800-FDA-1088. Where should I keep my medicine? Keep out of the reach of children. Store in a refrigerator between 2 and 8 degrees C (36 and 46 degrees F). Do not freeze. Do not shake. Throw away any unused portion if using a single-dose vial. Throw away any unused medicine after the expiration date. NOTE: This sheet is a summary. It may not cover all possible information. If you have questions about this medicine, talk to your doctor, pharmacist, or health care provider.  2015, [employer]. (2007-12-15 10:23:57)  

## 2014-05-25 ENCOUNTER — Encounter: Payer: Self-pay | Admitting: Hematology & Oncology

## 2014-05-26 ENCOUNTER — Other Ambulatory Visit (INDEPENDENT_AMBULATORY_CARE_PROVIDER_SITE_OTHER): Payer: Medicare Other

## 2014-05-26 ENCOUNTER — Encounter: Payer: Self-pay | Admitting: Surgery

## 2014-05-26 DIAGNOSIS — E7800 Pure hypercholesterolemia, unspecified: Secondary | ICD-10-CM

## 2014-05-26 LAB — LIPID PANEL
CHOLESTEROL: 93 mg/dL (ref 0–200)
HDL: 28.8 mg/dL — ABNORMAL LOW (ref >39.00)
LDL Cholesterol: 39 mg/dL (ref 0–99)
NonHDL: 64.2
TRIGLYCERIDES: 127 mg/dL (ref 0.0–149.0)
Total CHOL/HDL Ratio: 3
VLDL: 25.4 mg/dL (ref 0.0–40.0)

## 2014-05-26 LAB — HEPATIC FUNCTION PANEL
ALBUMIN: 4 g/dL (ref 3.5–5.2)
ALK PHOS: 81 U/L (ref 39–117)
ALT: 26 U/L (ref 0–53)
AST: 20 U/L (ref 0–37)
BILIRUBIN DIRECT: 0.1 mg/dL (ref 0.0–0.3)
BILIRUBIN TOTAL: 0.4 mg/dL (ref 0.2–1.2)
Total Protein: 7.5 g/dL (ref 6.0–8.3)

## 2014-05-27 ENCOUNTER — Encounter: Payer: Self-pay | Admitting: Surgery

## 2014-05-27 ENCOUNTER — Ambulatory Visit (INDEPENDENT_AMBULATORY_CARE_PROVIDER_SITE_OTHER): Payer: Medicare Other | Admitting: Surgery

## 2014-05-27 VITALS — BP 128/73 | HR 62 | Resp 16 | Ht 70.0 in | Wt 193.0 lb

## 2014-05-27 DIAGNOSIS — I714 Abdominal aortic aneurysm, without rupture, unspecified: Secondary | ICD-10-CM

## 2014-05-27 DIAGNOSIS — I739 Peripheral vascular disease, unspecified: Secondary | ICD-10-CM | POA: Diagnosis not present

## 2014-05-27 NOTE — Progress Notes (Signed)
Patient name: [name] MRN: [id number] DOB: August 19, 1945 Sex: male     Chief Complaint  Patient presents with  . Re-evaluation    6 mo  AAA   f/u w/Vasc. Lab duplex   S/P  EVAR  07-02-13      HISTORY OF PRESENT ILLNESS: The patient is back for follow-up.  He is status post endovascular aneurysm repair on 07/02/2013.  His prior to surgery course was very complicated.  He was initially diagnosed with diverticulitis.  He also underwent laparoscopic cholecystectomy secondary to biliary dyskinesia.  After aneurysm repair, his first postoperative image showed a endoleak.  His endoleak has resolved.  The last CT scan showed a maximum diameter of 4 cm.  Initially he was around 5.9.  He is currently being treated for myelodysplasia.  Past Medical History  Diagnosis Date  . Coronary atherosclerosis of native coronary artery     a. 10/2008 inf STEMI/PCI: LM 50d (IVUS-borderline lesion->med rx), LAD min irregs, LCX 58m, 70d, OM nl, RCA 158m (3.5x28 Vision BMS);  b. 11/2008 Lexiscan MV: EF 65%, no isch/scar.  . Essential hypertension, benign   . Pure hypercholesterolemia   . AAA (abdominal aortic aneurysm)     a. 05/2013 CT: 5.8 cm AAA.  Marland Kitchen Diverticulitis     a. 05/2013 CT: descending/sigmoid jxn w/o abscess.  . Osteoarthritis   . Tobacco abuse     a. ongoing - 1ppd for better part of 50 yrs.  . Normocytic anemia   . Cellulitis 06/10/2013    Right antecubital fossa at site of IV  03/12/13  . PONV (postoperative nausea and vomiting)   . Diverticulitis   . GERD (gastroesophageal reflux disease)   . MDS (myelodysplastic syndrome), high grade 04/13/2014    Past Surgical History  Procedure Laterality Date  . Cardiac stents  2010  . Cholecystectomy N/A 06/19/2013    Procedure: LAPAROSCOPIC CHOLECYSTECTOMY;  Surgeon: Harl Bowie, MD;  Location: [facility];  Service: General;  Laterality: N/A;  . Abdominal aortic endovascular stent graft N/A 07/02/2013    Procedure: ABDOMINAL AORTIC  ENDOVASCULAR STENT GRAFT;  Surgeon: Serafina Mitchell, MD;  Location: [facility] OR;  Service: Vascular;  Laterality: N/A;  . Esophagogastroduodenoscopy N/A 08/05/2013    Procedure: ESOPHAGOGASTRODUODENOSCOPY (EGD);  Surgeon: Lear Ng, MD;  Location: [facility] ENDOSCOPY;  Service: Endoscopy;  Laterality: N/A;  . Eye surgery Left     cataract surgery  . Back surgery      cervical fusion  . Colonoscopy with propofol N/A 10/15/2013    Procedure: COLONOSCOPY WITH PROPOFOL;  Surgeon: Lear Ng, MD;  Location: [facility];  Service: Endoscopy;  Laterality: N/A;    History   Social History  . Marital Status: Married    Spouse Name: N/A  . Number of Children: N/A  . Years of Education: N/A   Occupational History  . Not on file.   Social History Main Topics  . Smoking status: Current Every Day Smoker -- 1.00 packs/day for 60 years    Types: Cigarettes    Start date: 04/05/1964  . Smokeless tobacco: Never Used     Comment: 4-25-   STILL SMOKING  . Alcohol Use: No  . Drug Use: No  . Sexual Activity: Not on file   Other Topics Concern  . Not on file   Social History Narrative   Lives in [place] with wife.  Retired Furniture conservator/restorer.  Works out in the yard often - limited by claudication.  Does  not routinely exercise.    Family History  Problem Relation Age of Onset  . Heart attack Brother     90s  . Cancer Father     Lung  . Cancer Mother     Brain  . Cancer Brother     Kidney    Allergies as of 05/27/2014 - Review Complete 05/27/2014  Allergen Reaction Noted  . Vicodin [hydrocodone-acetaminophen] Itching 04/06/2014  . Cephalexin Other (See Comments) 07/01/2013  . Phenergan [promethazine hcl] Other (See Comments) 08/04/2013    Current Outpatient Prescriptions on File Prior to Visit  Medication Sig Dispense Refill  . acetaminophen (TYLENOL) 325 MG tablet Take 325 mg by mouth every 6 (six) hours as needed for moderate pain.    Marland Kitchen aspirin EC 81 MG tablet Take 1 tablet (81 mg  total) by mouth daily.    . clindamycin (CLEOCIN) 300 MG capsule Take 2 capsules 60 minutes before dental procedure. 2 capsule 0  . LORazepam (ATIVAN) 0.5 MG tablet Take 1 tablet (0.5 mg total) by mouth every 6 (six) hours as needed (Nausea or vomiting). 30 tablet 3  . metoprolol tartrate (LOPRESSOR) 25 MG tablet Take 0.5 tablets (12.5 mg total) by mouth 2 (two) times daily. 90 tablet 3  . nitroGLYCERIN (NITROSTAT) 0.4 MG SL tablet Place 1 tablet (0.4 mg total) under the tongue every 5 (five) minutes as needed for chest pain. 25 tablet 6  . pantoprazole (PROTONIX) 40 MG tablet Take 40 mg by mouth daily.    . predniSONE (DELTASONE) 20 MG tablet Take 2 pills a day for 3 days, then 2 pills a day for 3 days, then 1 pill a day for 3 days, then 1/2 pill a day for 5 days 40 tablet 2  . prochlorperazine (COMPAZINE) 10 MG tablet Take 1 tablet (10 mg total) by mouth every 6 (six) hours as needed (Nausea or vomiting). (Patient not taking: Reported on 05/09/2014) 30 tablet 1  . simvastatin (ZOCOR) 20 MG tablet Take 1 tablet (20 mg total) by mouth at bedtime. 30 tablet 5  . temazepam (RESTORIL) 15 MG capsule Take 1 capsule (15 mg total) by mouth at bedtime as needed for sleep. 30 capsule 0   No current facility-administered medications on file prior to visit.     REVIEW OF SYSTEMS: Cardiovascular: No chest pain, chest pressure, palpitations, orthopnea, or dyspnea on exertion. No claudication or rest pain,  No history of DVT or phlebitis. Pulmonary: No productive cough, asthma or wheezing. Neurologic: No weakness, paresthesias, aphasia, or amaurosis. No dizziness. Hematologic: No bleeding problems or clotting disorders. Musculoskeletal: No joint pain or joint swelling. Gastrointestinal: No blood in stool or hematemesis Genitourinary: No dysuria or hematuria. Psychiatric:: No history of major depression. Integumentary: No rashes or ulcers. Constitutional: No fever or chills.  Feels generally  weak  PHYSICAL EXAMINATION:   Vital signs are  Filed Vitals:   05/27/14 1121  BP: 128/73  Pulse: 62  Resp: 16  Height: 5\' 10"  (1.778 m)  Weight: 193 lb (87.544 kg)  SpO2: 99%   Body mass index is 27.69 kg/(m^2). General: The patient appears their stated age. HEENT:  No gross abnormalities Pulmonary:  Non labored breathing Abdomen: Soft and non-tender Musculoskeletal: There are no major deformities. Neurologic: No focal weakness or paresthesias are detected, Skin: There are no ulcer or rashes noted. Psychiatric: The patient has normal affect. Cardiovascular: There is a regular rate and rhythm without significant murmur appreciated.  No carotid bruits.  Palpable pedal pulses.  Diagnostic Studies I have reviewed his CT scan from March.  The stent graft remains in good position.  I did not see an endoleak.  Maximum aortic diameter is around 4 cm  Assessment: Status post endovascular aneurysm repair Plan: The patient continues to do very well from an aneurysm perspective.  His aneurysm size has decreased.  He initially had an endoleak, however I think this has resolved.  I will schedule him for follow-up in one year with an abdominal ultrasound.  If he has a CT scan within 6 months of his visit for his myelodysplasia, he does not have to have this ultrasound   V. Leia Alf, M.D. [facility] Office: [phone] Pager:  [phone]

## 2014-05-30 ENCOUNTER — Other Ambulatory Visit: Payer: Medicare Other

## 2014-05-30 ENCOUNTER — Other Ambulatory Visit: Payer: Self-pay | Admitting: *Deleted

## 2014-05-30 ENCOUNTER — Other Ambulatory Visit (HOSPITAL_BASED_OUTPATIENT_CLINIC_OR_DEPARTMENT_OTHER): Payer: Medicare Other

## 2014-05-30 DIAGNOSIS — I714 Abdominal aortic aneurysm, without rupture, unspecified: Secondary | ICD-10-CM

## 2014-05-30 DIAGNOSIS — D4620 Refractory anemia with excess of blasts, unspecified: Secondary | ICD-10-CM

## 2014-05-30 DIAGNOSIS — D4622 Refractory anemia with excess of blasts 2: Secondary | ICD-10-CM | POA: Diagnosis present

## 2014-05-30 LAB — CBC WITH DIFFERENTIAL (CANCER CENTER ONLY)
HCT: 21.9 % — ABNORMAL LOW (ref 38.7–49.9)
HGB: 7.2 g/dL — ABNORMAL LOW (ref 13.0–17.1)
MCH: 30 pg (ref 28.0–33.4)
MCHC: 32.9 g/dL (ref 32.0–35.9)
MCV: 91 fL (ref 82–98)
Platelets: 145 10*3/uL (ref 145–400)
RBC: 2.4 10*6/uL — AB (ref 4.20–5.70)
RDW: 18.4 % — ABNORMAL HIGH (ref 11.1–15.7)
WBC: 6.5 10*3/uL (ref 4.0–10.0)

## 2014-05-30 LAB — MANUAL DIFFERENTIAL (CHCC SATELLITE)
ALC: 1.4 10*3/uL (ref 0.9–3.3)
ANC (CHCC HP manual diff): 4.4 10*3/uL (ref 1.5–6.5)
BASO: 1 % (ref 0–2)
LYMPH: 22 % (ref 14–48)
METAMYELOCYTES PCT: 1 % — AB (ref 0–0)
MONO: 9 % (ref 0–13)
Myelocytes: 1 % — ABNORMAL HIGH (ref 0–0)
PLT EST ~~LOC~~: ADEQUATE
SEG: 66 % (ref 40–75)

## 2014-05-31 ENCOUNTER — Encounter: Payer: Self-pay | Admitting: Hematology & Oncology

## 2014-06-06 ENCOUNTER — Other Ambulatory Visit (HOSPITAL_BASED_OUTPATIENT_CLINIC_OR_DEPARTMENT_OTHER): Payer: Medicare Other

## 2014-06-06 ENCOUNTER — Ambulatory Visit (HOSPITAL_BASED_OUTPATIENT_CLINIC_OR_DEPARTMENT_OTHER): Payer: Medicare Other

## 2014-06-06 ENCOUNTER — Encounter: Payer: Self-pay | Admitting: Hematology & Oncology

## 2014-06-06 ENCOUNTER — Ambulatory Visit (HOSPITAL_BASED_OUTPATIENT_CLINIC_OR_DEPARTMENT_OTHER): Payer: Medicare Other | Admitting: Hematology & Oncology

## 2014-06-06 VITALS — BP 123/68 | HR 61 | Temp 97.8°F | Resp 20 | Ht 70.0 in | Wt 193.0 lb

## 2014-06-06 DIAGNOSIS — D46Z Other myelodysplastic syndromes: Secondary | ICD-10-CM | POA: Diagnosis present

## 2014-06-06 DIAGNOSIS — D61818 Other pancytopenia: Secondary | ICD-10-CM

## 2014-06-06 LAB — CMP (CANCER CENTER ONLY)
ALT(SGPT): 29 U/L (ref 10–47)
AST: 25 U/L (ref 11–38)
Albumin: 3.8 g/dL (ref 3.3–5.5)
Alkaline Phosphatase: 75 U/L (ref 26–84)
BUN: 23 mg/dL — AB (ref 7–22)
CALCIUM: 9.4 mg/dL (ref 8.0–10.3)
CO2: 26 meq/L (ref 18–33)
CREATININE: 1.4 mg/dL — AB (ref 0.6–1.2)
Chloride: 106 mEq/L (ref 98–108)
Glucose, Bld: 112 mg/dL (ref 73–118)
Potassium: 4.1 mEq/L (ref 3.3–4.7)
Sodium: 141 mEq/L (ref 128–145)
Total Bilirubin: 0.9 mg/dl (ref 0.20–1.60)
Total Protein: 7.7 g/dL (ref 6.4–8.1)

## 2014-06-06 LAB — CBC WITH DIFFERENTIAL (CANCER CENTER ONLY)
HEMATOCRIT: 22.1 % — AB (ref 38.7–49.9)
HGB: 7.1 g/dL — ABNORMAL LOW (ref 13.0–17.1)
MCH: 29.6 pg (ref 28.0–33.4)
MCHC: 32.1 g/dL (ref 32.0–35.9)
MCV: 92 fL (ref 82–98)
Platelets: 164 10*3/uL (ref 145–400)
RBC: 2.4 10*6/uL — ABNORMAL LOW (ref 4.20–5.70)
RDW: 19.2 % — ABNORMAL HIGH (ref 11.1–15.7)
WBC: 5.9 10*3/uL (ref 4.0–10.0)

## 2014-06-06 LAB — MANUAL DIFFERENTIAL (CHCC SATELLITE)
ALC: 1.7 10*3/uL (ref 0.9–3.3)
ANC (CHCC HP manual diff): 3.7 10*3/uL (ref 1.5–6.5)
BAND NEUTROPHILS: 1 % (ref 0–10)
LYMPH: 28 % (ref 14–48)
MONO: 9 % (ref 0–13)
Metamyelocytes: 1 % — ABNORMAL HIGH (ref 0–0)
PLT EST ~~LOC~~: ADEQUATE
SEG: 61 % (ref 40–75)

## 2014-06-06 LAB — RETICULOCYTES (CHCC)
ABS RETIC: 40.5 10*3/uL (ref 19.0–186.0)
RBC.: 2.53 MIL/uL — ABNORMAL LOW (ref 4.22–5.81)
RETIC CT PCT: 1.6 % (ref 0.4–2.3)

## 2014-06-06 LAB — FERRITIN CHCC: Ferritin: 569 ng/ml — ABNORMAL HIGH (ref 22–316)

## 2014-06-06 LAB — IRON AND TIBC CHCC
%SAT: 57 % — ABNORMAL HIGH (ref 20–55)
Iron: 164 ug/dL — ABNORMAL HIGH (ref 42–163)
TIBC: 289 ug/dL (ref 202–409)
UIBC: 125 ug/dL (ref 117–376)

## 2014-06-06 MED ORDER — DARBEPOETIN ALFA 300 MCG/0.6ML IJ SOSY
PREFILLED_SYRINGE | INTRAMUSCULAR | Status: AC
  Filled 2014-06-06: qty 0.6

## 2014-06-06 MED ORDER — ONDANSETRON HCL 8 MG PO TABS
ORAL_TABLET | ORAL | Status: AC
  Filled 2014-06-06: qty 1

## 2014-06-06 MED ORDER — SODIUM CHLORIDE 0.9 % IV SOLN
INTRAVENOUS | Status: DC
  Administered 2014-06-06: 13:00:00 via INTRAVENOUS

## 2014-06-06 MED ORDER — DARBEPOETIN ALFA 300 MCG/0.6ML IJ SOSY
300.0000 ug | PREFILLED_SYRINGE | Freq: Once | INTRAMUSCULAR | Status: AC
  Administered 2014-06-06: 300 ug via SUBCUTANEOUS

## 2014-06-06 MED ORDER — FAMOTIDINE IN NACL 20-0.9 MG/50ML-% IV SOLN
40.0000 mg | Freq: Once | INTRAVENOUS | Status: AC
  Administered 2014-06-06: 40 mg via INTRAVENOUS

## 2014-06-06 MED ORDER — ONDANSETRON HCL 8 MG PO TABS
8.0000 mg | ORAL_TABLET | Freq: Once | ORAL | Status: AC
  Administered 2014-06-06: 8 mg via ORAL

## 2014-06-06 MED ORDER — AZACITIDINE CHEMO SQ INJECTION
76.0000 mg/m2 | Freq: Once | INTRAMUSCULAR | Status: AC
  Administered 2014-06-06: 160 mg via SUBCUTANEOUS
  Filled 2014-06-06: qty 6.4

## 2014-06-06 MED ORDER — FAMOTIDINE IN NACL 20-0.9 MG/50ML-% IV SOLN
INTRAVENOUS | Status: AC
  Filled 2014-06-06: qty 100

## 2014-06-06 MED ORDER — METHYLPREDNISOLONE SODIUM SUCC 125 MG IJ SOLR
INTRAMUSCULAR | Status: AC
  Filled 2014-06-06: qty 2

## 2014-06-06 MED ORDER — METHYLPREDNISOLONE SODIUM SUCC 125 MG IJ SOLR
125.0000 mg | Freq: Once | INTRAMUSCULAR | Status: AC
  Administered 2014-06-06: 125 mg via INTRAVENOUS

## 2014-06-06 NOTE — Progress Notes (Signed)
Hematology and Oncology Follow Up Visit  [name] [id number] Aug 11, 1945 69 y.o. 06/06/2014   Principle Diagnosis:   RAEB-2 (normal cytogenetics with mutated ASXL-1, TET2, U2AF1 genes)  Current Therapy:    Status post cycle 2 of Vidaza  Aranesp 300 g subcutaneous as needed for hemoglobin less than 10     Interim History:  [name] is back for follow-up. He is doing pretty well.we have not had to transfuse him for over a month now.  Last week was quite stressful for units wife. They had to let their 44 year old dog oh. This was very difficult. Is still has an effect on them. I totally understand this.  He is not feeling all that tired. He's had no shortness of breath. There's been no abdominal pain. He's had no change in bowel or bladder habits. He's had no rashes. There's been no leg swelling.  Overall, his performance status is ECOG 1.  Medications:  Current outpatient prescriptions:  .  acetaminophen (TYLENOL) 325 MG tablet, Take 325 mg by mouth every 6 (six) hours as needed for moderate pain., Disp: , Rfl:  .  aspirin EC 81 MG tablet, Take 1 tablet (81 mg total) by mouth daily., Disp: , Rfl:  .  clindamycin (CLEOCIN) 300 MG capsule, Take 2 capsules 60 minutes before dental procedure., Disp: 2 capsule, Rfl: 0 .  LORazepam (ATIVAN) 0.5 MG tablet, Take 1 tablet (0.5 mg total) by mouth every 6 (six) hours as needed (Nausea or vomiting)., Disp: 30 tablet, Rfl: 3 .  metoprolol tartrate (LOPRESSOR) 25 MG tablet, Take 0.5 tablets (12.5 mg total) by mouth 2 (two) times daily., Disp: 90 tablet, Rfl: 3 .  nitroGLYCERIN (NITROSTAT) 0.4 MG SL tablet, Place 1 tablet (0.4 mg total) under the tongue every 5 (five) minutes as needed for chest pain., Disp: 25 tablet, Rfl: 6 .  pantoprazole (PROTONIX) 40 MG tablet, Take 40 mg by mouth daily., Disp: , Rfl:  .  predniSONE (DELTASONE) 20 MG tablet, Take 2 pills a day for 3 days, then 2 pills a day for 3 days, then 1 pill a day for 3 days, then  1/2 pill a day for 5 days, Disp: 40 tablet, Rfl: 2 .  simvastatin (ZOCOR) 20 MG tablet, Take 1 tablet (20 mg total) by mouth at bedtime., Disp: 30 tablet, Rfl: 5 .  temazepam (RESTORIL) 15 MG capsule, Take 1 capsule (15 mg total) by mouth at bedtime as needed for sleep., Disp: 30 capsule, Rfl: 0 .  prochlorperazine (COMPAZINE) 10 MG tablet, Take 1 tablet (10 mg total) by mouth every 6 (six) hours as needed (Nausea or vomiting). (Patient not taking: Reported on 06/06/2014), Disp: 30 tablet, Rfl: 1  Allergies:  Allergies  Allergen Reactions  . Vicodin [Hydrocodone-Acetaminophen] Itching  . Cephalexin Other (See Comments)    Nausea and stomach cramps  . Phenergan [Promethazine Hcl] Other (See Comments)    "restless legs"    Past Medical History, Surgical history, Social history, and Family History were reviewed and updated.  Review of Systems: As above  Physical Exam:  height is 5\' 10"  (1.778 m) and weight is 193 lb (87.544 kg). His oral temperature is 97.8 F (36.6 C). His blood pressure is 123/68 and his pulse is 61. His respiration is 20.   Wt Readings from Last 3 Encounters:  06/06/14 193 lb (87.544 kg)  05/27/14 193 lb (87.544 kg)  05/09/14 194 lb (87.998 kg)     Well-developed and well-nourished white gentleman in no obvious  distress. Head and neck exam shows no ocular or oral lesions. There are no palpable cervical or supraclavicular lymph does. Lungs are clear. Cardiac exam regular rate and rhythm with no murmurs, rubs or bruits. Abdomen is soft. Has good bowel sounds. There is no fluid wave. There is no palpable liver or spleen tip. Back exam shows no tenderness over the spine, ribs or hips. Extremities shows no clubbing, cyanosis or edema. Skin exam shows no rashes, ecchymoses or petechia. Neurological exam is nonfocal.  Lab Results  Component Value Date   WBC 5.9 06/06/2014   HGB 7.1* 06/06/2014   HCT 22.1* 06/06/2014   MCV 92 06/06/2014   PLT 164 06/06/2014      Chemistry      Component Value Date/Time   NA 141 06/06/2014 1109   NA 136 04/06/2014 1454   K 4.1 06/06/2014 1109   K 4.4 04/06/2014 1454   CL 106 06/06/2014 1109   CL 104 04/06/2014 1454   CO2 26 06/06/2014 1109   CO2 19 04/06/2014 1454   BUN 23* 06/06/2014 1109   BUN 24* 04/06/2014 1454   CREATININE 1.4* 06/06/2014 1109   CREATININE 1.30 04/06/2014 1454      Component Value Date/Time   CALCIUM 9.4 06/06/2014 1109   CALCIUM 9.3 04/06/2014 1454   ALKPHOS 75 06/06/2014 1109   ALKPHOS 81 05/26/2014 0845   AST 25 06/06/2014 1109   AST 20 05/26/2014 0845   ALT 29 06/06/2014 1109   ALT 26 05/26/2014 0845   BILITOT 0.90 06/06/2014 1109   BILITOT 0.4 05/26/2014 0845         Impression and Plan: [name] is 69 year old gentleman with myelodysplasia. He has normal cytogenetics. We did do the NGS of his blood we set show some occasions. Again, these might indicate an increased risk of transformation but it is not definitive.  Overall, I think he is responding perisplenic count is coming up nicely. I told he has wife that the anemia is oftentimes the last cell line to improve.  I think we will try to increase his Aranesp to every 2 weeks. I think this might be able to get his blood count up.  We'll proceed with his third cycle today.  His fourth cycle will be in a month after the fourth cycle, we will do a bone marrow test on him.  I spent about 30 minutes with he and his wife.   Volanda Napoleon, MD 5/23/201612:25 PM

## 2014-06-06 NOTE — Patient Instructions (Signed)
Darbepoetin Alfa injection What is this medicine? DARBEPOETIN ALFA (dar be POE e tin AL fa) helps your body make more red blood cells. It is used to treat anemia caused by chronic kidney failure and chemotherapy. This medicine may be used for other purposes; ask your health care provider or pharmacist if you have questions. COMMON BRAND NAME(S): Aranesp What should I tell my health care provider before I take this medicine? They need to know if you have any of these conditions: -blood clotting disorders or history of blood clots -cancer patient not on chemotherapy -cystic fibrosis -heart disease, such as angina, heart failure, or a history of a heart attack -hemoglobin level of 12 g/dL or greater -high blood pressure -low levels of folate, iron, or vitamin B12 -seizures -an unusual or allergic reaction to darbepoetin, erythropoietin, albumin, hamster proteins, latex, other medicines, foods, dyes, or preservatives -pregnant or trying to get pregnant -breast-feeding How should I use this medicine? This medicine is for injection into a vein or under the skin. It is usually given by a health care professional in a hospital or clinic setting. If you get this medicine at home, you will be taught how to prepare and give this medicine. Do not shake the solution before you withdraw a dose. Use exactly as directed. Take your medicine at regular intervals. Do not take your medicine more often than directed. It is important that you put your used needles and syringes in a special sharps container. Do not put them in a trash can. If you do not have a sharps container, call your pharmacist or healthcare provider to get one. Talk to your pediatrician regarding the use of this medicine in children. While this medicine may be used in children as young as 1 year for selected conditions, precautions do apply. Overdosage: If you think you have taken too much of this medicine contact a poison control center or  emergency room at once. NOTE: This medicine is only for you. Do not share this medicine with others. What if I miss a dose? If you miss a dose, take it as soon as you can. If it is almost time for your next dose, take only that dose. Do not take double or extra doses. What may interact with this medicine? Do not take this medicine with any of the following medications: -epoetin alfa This list may not describe all possible interactions. Give your health care provider a list of all the medicines, herbs, non-prescription drugs, or dietary supplements you use. Also tell them if you smoke, drink alcohol, or use illegal drugs. Some items may interact with your medicine. What should I watch for while using this medicine? Visit your prescriber or health care professional for regular checks on your progress and for the needed blood tests and blood pressure measurements. It is especially important for the doctor to make sure your hemoglobin level is in the desired range, to limit the risk of potential side effects and to give you the best benefit. Keep all appointments for any recommended tests. Check your blood pressure as directed. Ask your doctor what your blood pressure should be and when you should contact him or her. As your body makes more red blood cells, you may need to take iron, folic acid, or vitamin B supplements. Ask your doctor or health care provider which products are right for you. If you have kidney disease continue dietary restrictions, even though this medication can make you feel better. Talk with your doctor or health   care professional about the foods you eat and the vitamins that you take. What side effects may I notice from receiving this medicine? Side effects that you should report to your doctor or health care professional as soon as possible: -allergic reactions like skin rash, itching or hives, swelling of the face, lips, or tongue -breathing problems -changes in vision -chest  pain -confusion, trouble speaking or understanding -feeling faint or lightheaded, falls -high blood pressure -muscle aches or pains -pain, swelling, warmth in the leg -rapid weight gain -severe headaches -sudden numbness or weakness of the face, arm or leg -trouble walking, dizziness, loss of balance or coordination -seizures (convulsions) -swelling of the ankles, feet, hands -unusually weak or tired Side effects that usually do not require medical attention (report to your doctor or health care professional if they continue or are bothersome): -diarrhea -fever, chills (flu-like symptoms) -headaches -nausea, vomiting -redness, stinging, or swelling at site where injected This list may not describe all possible side effects. Call your doctor for medical advice about side effects. You may report side effects to FDA at 1-800-FDA-1088. Where should I keep my medicine? Keep out of the reach of children. Store in a refrigerator between 2 and 8 degrees C (36 and 46 degrees F). Do not freeze. Do not shake. Throw away any unused portion if using a single-dose vial. Throw away any unused medicine after the expiration date. NOTE: This sheet is a summary. It may not cover all possible information. If you have questions about this medicine, talk to your doctor, pharmacist, or health care provider.  2015, Elsevier/Gold Standard. (2007-12-15 10:23:57)   Azacitidine suspension for injection (subcutaneous use) What is this medicine? AZACITIDINE (ay [place]) is a chemotherapy drug. This medicine reduces the growth of cancer cells and can suppress the immune system. It is used for treating myelodysplastic syndrome or some types of leukemia. This medicine may be used for other purposes; ask your health care provider or pharmacist if you have questions. COMMON BRAND NAME(S): Vidaza What should I tell my health care provider before I take this medicine? They need to know if you have any of these  conditions: -infection (especially a virus infection such as chickenpox, cold sores, or herpes) -kidney disease -liver disease -liver tumors -an unusual or allergic reaction to azacitidine, mannitol, other medicines, foods, dyes, or preservatives -pregnant or trying to get pregnant -breast-feeding How should I use this medicine? This medicine is for injection under the skin. It is administered in a hospital or clinic by a specially trained health care professional. Talk to your pediatrician regarding the use of this medicine in children. While this drug may be prescribed for selected conditions, precautions do apply. Overdosage: If you think you have taken too much of this medicine contact a poison control center or emergency room at once. NOTE: This medicine is only for you. Do not share this medicine with others. What if I miss a dose? It is important not to miss your dose. Call your doctor or health care professional if you are unable to keep an appointment. What may interact with this medicine? -vaccines Talk to your doctor or health care professional before taking any of these medicines: -acetaminophen -aspirin -ibuprofen -ketoprofen -naproxen This list may not describe all possible interactions. Give your health care provider a list of all the medicines, herbs, non-prescription drugs, or dietary supplements you use. Also tell them if you smoke, drink alcohol, or use illegal drugs. Some items may interact with your medicine.  What should I watch for while using this medicine? Visit your doctor for checks on your progress. This drug may make you feel generally unwell. This is not uncommon, as chemotherapy can affect healthy cells as well as cancer cells. Report any side effects. Continue your course of treatment even though you feel ill unless your doctor tells you to stop. In some cases, you may be given additional medicines to help with side effects. Follow all directions for their  use. Call your doctor or health care professional for advice if you get a fever, chills or sore throat, or other symptoms of a cold or flu. Do not treat yourself. This drug decreases your body's ability to fight infections. Try to avoid being around people who are sick. This medicine may increase your risk to bruise or bleed. Call your doctor or health care professional if you notice any unusual bleeding. Be careful brushing and flossing your teeth or using a toothpick because you may get an infection or bleed more easily. If you have any dental work done, tell your dentist you are receiving this medicine. Avoid taking products that contain aspirin, acetaminophen, ibuprofen, naproxen, or ketoprofen unless instructed by your doctor. These medicines may hide a fever. Do not have any vaccinations without your doctor's approval and avoid anyone who has recently had oral polio vaccine. Do not become pregnant while taking this medicine. Women should inform their doctor if they wish to become pregnant or think they might be pregnant. There is a potential for serious side effects to an unborn child. Talk to your health care professional or pharmacist for more information. Do not breast-feed an infant while taking this medicine. If you are a man, you should not father a child while receiving treatment. What side effects may I notice from receiving this medicine? Side effects that you should report to your doctor or health care professional as soon as possible: -allergic reactions like skin rash, itching or hives, swelling of the face, lips, or tongue -low blood counts - this medicine may decrease the number of white blood cells, red blood cells and platelets. You may be at increased risk for infections and bleeding. -signs of infection - fever or chills, cough, sore throat, pain or difficulty passing urine -signs of decreased platelets or bleeding - bruising, pinpoint red spots on the skin, black, tarry stools,  blood in the urine -signs of decreased red blood cells - unusually weak or tired, fainting spells, lightheadedness -reactions at the injection site including redness, pain, itching, or bruising -breathing problems -changes in vision -fever -mouth sores -stomach pain -vomiting Side effects that usually do not require medical attention (report to your doctor or health care professional if they continue or are bothersome): -constipation -diarrhea -loss of appetite -nausea -pain or redness at the injection site -weak or tired This list may not describe all possible side effects. Call your doctor for medical advice about side effects. You may report side effects to FDA at 1-800-FDA-1088. Where should I keep my medicine? This drug is given in a hospital or clinic and will not be stored at home. NOTE: This sheet is a summary. It may not cover all possible information. If you have questions about this medicine, talk to your doctor, pharmacist, or health care provider.  2015, Elsevier/Gold Standard. (2007-03-26 11:04:07)

## 2014-06-07 ENCOUNTER — Ambulatory Visit (HOSPITAL_BASED_OUTPATIENT_CLINIC_OR_DEPARTMENT_OTHER): Payer: Medicare Other

## 2014-06-07 ENCOUNTER — Other Ambulatory Visit: Payer: Self-pay | Admitting: *Deleted

## 2014-06-07 VITALS — BP 122/62 | HR 58 | Temp 98.2°F | Resp 16

## 2014-06-07 DIAGNOSIS — Z5111 Encounter for antineoplastic chemotherapy: Secondary | ICD-10-CM

## 2014-06-07 DIAGNOSIS — D4620 Refractory anemia with excess of blasts, unspecified: Secondary | ICD-10-CM | POA: Diagnosis not present

## 2014-06-07 DIAGNOSIS — D61818 Other pancytopenia: Secondary | ICD-10-CM

## 2014-06-07 DIAGNOSIS — D46Z Other myelodysplastic syndromes: Secondary | ICD-10-CM

## 2014-06-07 MED ORDER — FAMOTIDINE IN NACL 20-0.9 MG/50ML-% IV SOLN
INTRAVENOUS | Status: AC
  Filled 2014-06-07: qty 100

## 2014-06-07 MED ORDER — METHYLPREDNISOLONE SODIUM SUCC 125 MG IJ SOLR: 125.0000 mg | Freq: Once | INTRAMUSCULAR | Status: DC

## 2014-06-07 MED ORDER — AZACITIDINE CHEMO SQ INJECTION
76.0000 mg/m2 | Freq: Once | INTRAMUSCULAR | Status: AC
  Administered 2014-06-07: 160 mg via SUBCUTANEOUS
  Filled 2014-06-07: qty 6.4

## 2014-06-07 MED ORDER — METHYLPREDNISOLONE SODIUM SUCC 125 MG IJ SOLR
INTRAMUSCULAR | Status: AC
  Filled 2014-06-07: qty 2

## 2014-06-07 MED ORDER — FAMOTIDINE IN NACL 20-0.9 MG/50ML-% IV SOLN
40.0000 mg | Freq: Once | INTRAVENOUS | Status: AC
  Administered 2014-06-07: 40 mg via INTRAVENOUS

## 2014-06-07 MED ORDER — ONDANSETRON HCL 8 MG PO TABS
8.0000 mg | ORAL_TABLET | Freq: Once | ORAL | Status: AC
  Administered 2014-06-07: 8 mg via ORAL

## 2014-06-07 MED ORDER — ONDANSETRON HCL 8 MG PO TABS
ORAL_TABLET | ORAL | Status: AC
  Filled 2014-06-07: qty 1

## 2014-06-07 NOTE — Patient Instructions (Signed)
Azacitidine suspension for injection (subcutaneous use) What is this medicine? AZACITIDINE (ay [place]) is a chemotherapy drug. This medicine reduces the growth of cancer cells and can suppress the immune system. It is used for treating myelodysplastic syndrome or some types of leukemia. This medicine may be used for other purposes; ask your health care provider or pharmacist if you have questions. COMMON BRAND NAME(S): Vidaza What should I tell my health care provider before I take this medicine? They need to know if you have any of these conditions: -infection (especially a virus infection such as chickenpox, cold sores, or herpes) -kidney disease -liver disease -liver tumors -an unusual or allergic reaction to azacitidine, mannitol, other medicines, foods, dyes, or preservatives -pregnant or trying to get pregnant -breast-feeding How should I use this medicine? This medicine is for injection under the skin. It is administered in a hospital or clinic by a specially trained health care professional. Talk to your pediatrician regarding the use of this medicine in children. While this drug may be prescribed for selected conditions, precautions do apply. Overdosage: If you think you have taken too much of this medicine contact a poison control center or emergency room at once. NOTE: This medicine is only for you. Do not share this medicine with others. What if I miss a dose? It is important not to miss your dose. Call your doctor or health care professional if you are unable to keep an appointment. What may interact with this medicine? -vaccines Talk to your doctor or health care professional before taking any of these medicines: -acetaminophen -aspirin -ibuprofen -ketoprofen -naproxen This list may not describe all possible interactions. Give your health care provider a list of all the medicines, herbs, non-prescription drugs, or dietary supplements you use. Also tell them if you  smoke, drink alcohol, or use illegal drugs. Some items may interact with your medicine. What should I watch for while using this medicine? Visit your doctor for checks on your progress. This drug may make you feel generally unwell. This is not uncommon, as chemotherapy can affect healthy cells as well as cancer cells. Report any side effects. Continue your course of treatment even though you feel ill unless your doctor tells you to stop. In some cases, you may be given additional medicines to help with side effects. Follow all directions for their use. Call your doctor or health care professional for advice if you get a fever, chills or sore throat, or other symptoms of a cold or flu. Do not treat yourself. This drug decreases your body's ability to fight infections. Try to avoid being around people who are sick. This medicine may increase your risk to bruise or bleed. Call your doctor or health care professional if you notice any unusual bleeding. Be careful brushing and flossing your teeth or using a toothpick because you may get an infection or bleed more easily. If you have any dental work done, tell your dentist you are receiving this medicine. Avoid taking products that contain aspirin, acetaminophen, ibuprofen, naproxen, or ketoprofen unless instructed by your doctor. These medicines may hide a fever. Do not have any vaccinations without your doctor's approval and avoid anyone who has recently had oral polio vaccine. Do not become pregnant while taking this medicine. Women should inform their doctor if they wish to become pregnant or think they might be pregnant. There is a potential for serious side effects to an unborn child. Talk to your health care professional or pharmacist for more information.  Do not breast-feed an infant while taking this medicine. If you are a man, you should not father a child while receiving treatment. What side effects may I notice from receiving this medicine? Side  effects that you should report to your doctor or health care professional as soon as possible: -allergic reactions like skin rash, itching or hives, swelling of the face, lips, or tongue -low blood counts - this medicine may decrease the number of white blood cells, red blood cells and platelets. You may be at increased risk for infections and bleeding. -signs of infection - fever or chills, cough, sore throat, pain or difficulty passing urine -signs of decreased platelets or bleeding - bruising, pinpoint red spots on the skin, black, tarry stools, blood in the urine -signs of decreased red blood cells - unusually weak or tired, fainting spells, lightheadedness -reactions at the injection site including redness, pain, itching, or bruising -breathing problems -changes in vision -fever -mouth sores -stomach pain -vomiting Side effects that usually do not require medical attention (report to your doctor or health care professional if they continue or are bothersome): -constipation -diarrhea -loss of appetite -nausea -pain or redness at the injection site -weak or tired This list may not describe all possible side effects. Call your doctor for medical advice about side effects. You may report side effects to FDA at 1-800-FDA-1088. Where should I keep my medicine? This drug is given in a hospital or clinic and will not be stored at home. NOTE: This sheet is a summary. It may not cover all possible information. If you have questions about this medicine, talk to your doctor, pharmacist, or health care provider.  2015, [employer]. (2007-03-26 11:04:07)

## 2014-06-08 ENCOUNTER — Ambulatory Visit (HOSPITAL_BASED_OUTPATIENT_CLINIC_OR_DEPARTMENT_OTHER): Payer: Medicare Other

## 2014-06-08 VITALS — BP 115/67 | HR 62 | Temp 98.1°F | Resp 18

## 2014-06-08 DIAGNOSIS — D4620 Refractory anemia with excess of blasts, unspecified: Secondary | ICD-10-CM | POA: Diagnosis not present

## 2014-06-08 DIAGNOSIS — Z5111 Encounter for antineoplastic chemotherapy: Secondary | ICD-10-CM

## 2014-06-08 DIAGNOSIS — D61818 Other pancytopenia: Secondary | ICD-10-CM

## 2014-06-08 MED ORDER — ONDANSETRON HCL 8 MG PO TABS
8.0000 mg | ORAL_TABLET | Freq: Once | ORAL | Status: AC
  Administered 2014-06-08: 8 mg via ORAL

## 2014-06-08 MED ORDER — FAMOTIDINE IN NACL 20-0.9 MG/50ML-% IV SOLN
INTRAVENOUS | Status: AC
  Filled 2014-06-08: qty 100

## 2014-06-08 MED ORDER — SODIUM CHLORIDE 0.9 % IV SOLN
INTRAVENOUS | Status: DC
  Administered 2014-06-08: 11:00:00 via INTRAVENOUS

## 2014-06-08 MED ORDER — AZACITIDINE CHEMO SQ INJECTION
77.0000 mg/m2 | Freq: Once | INTRAMUSCULAR | Status: AC
  Administered 2014-06-08: 160 mg via SUBCUTANEOUS
  Filled 2014-06-08: qty 6.4

## 2014-06-08 MED ORDER — ONDANSETRON HCL 8 MG PO TABS
ORAL_TABLET | ORAL | Status: AC
  Filled 2014-06-08: qty 1

## 2014-06-08 MED ORDER — METHYLPREDNISOLONE SODIUM SUCC 125 MG IJ SOLR
125.0000 mg | Freq: Once | INTRAMUSCULAR | Status: AC
Start: 2014-06-08 — End: 2014-06-08
  Administered 2014-06-08: 125 mg via INTRAVENOUS

## 2014-06-08 MED ORDER — FAMOTIDINE IN NACL 20-0.9 MG/50ML-% IV SOLN
40.0000 mg | Freq: Once | INTRAVENOUS | Status: AC
  Administered 2014-06-08: 40 mg via INTRAVENOUS

## 2014-06-08 MED ORDER — METHYLPREDNISOLONE SODIUM SUCC 125 MG IJ SOLR
INTRAMUSCULAR | Status: AC
  Filled 2014-06-08: qty 2

## 2014-06-08 NOTE — Patient Instructions (Signed)
Azacitidine suspension for injection (subcutaneous use) What is this medicine? AZACITIDINE (ay [place]) is a chemotherapy drug. This medicine reduces the growth of cancer cells and can suppress the immune system. It is used for treating myelodysplastic syndrome or some types of leukemia. This medicine may be used for other purposes; ask your health care provider or pharmacist if you have questions. COMMON BRAND NAME(S): Vidaza What should I tell my health care provider before I take this medicine? They need to know if you have any of these conditions: -infection (especially a virus infection such as chickenpox, cold sores, or herpes) -kidney disease -liver disease -liver tumors -an unusual or allergic reaction to azacitidine, mannitol, other medicines, foods, dyes, or preservatives -pregnant or trying to get pregnant -breast-feeding How should I use this medicine? This medicine is for injection under the skin. It is administered in a hospital or clinic by a specially trained health care professional. Talk to your pediatrician regarding the use of this medicine in children. While this drug may be prescribed for selected conditions, precautions do apply. Overdosage: If you think you have taken too much of this medicine contact a poison control center or emergency room at once. NOTE: This medicine is only for you. Do not share this medicine with others. What if I miss a dose? It is important not to miss your dose. Call your doctor or health care professional if you are unable to keep an appointment. What may interact with this medicine? -vaccines Talk to your doctor or health care professional before taking any of these medicines: -acetaminophen -aspirin -ibuprofen -ketoprofen -naproxen This list may not describe all possible interactions. Give your health care provider a list of all the medicines, herbs, non-prescription drugs, or dietary supplements you use. Also tell them if you  smoke, drink alcohol, or use illegal drugs. Some items may interact with your medicine. What should I watch for while using this medicine? Visit your doctor for checks on your progress. This drug may make you feel generally unwell. This is not uncommon, as chemotherapy can affect healthy cells as well as cancer cells. Report any side effects. Continue your course of treatment even though you feel ill unless your doctor tells you to stop. In some cases, you may be given additional medicines to help with side effects. Follow all directions for their use. Call your doctor or health care professional for advice if you get a fever, chills or sore throat, or other symptoms of a cold or flu. Do not treat yourself. This drug decreases your body's ability to fight infections. Try to avoid being around people who are sick. This medicine may increase your risk to bruise or bleed. Call your doctor or health care professional if you notice any unusual bleeding. Be careful brushing and flossing your teeth or using a toothpick because you may get an infection or bleed more easily. If you have any dental work done, tell your dentist you are receiving this medicine. Avoid taking products that contain aspirin, acetaminophen, ibuprofen, naproxen, or ketoprofen unless instructed by your doctor. These medicines may hide a fever. Do not have any vaccinations without your doctor's approval and avoid anyone who has recently had oral polio vaccine. Do not become pregnant while taking this medicine. Women should inform their doctor if they wish to become pregnant or think they might be pregnant. There is a potential for serious side effects to an unborn child. Talk to your health care professional or pharmacist for more information.  Do not breast-feed an infant while taking this medicine. If you are a man, you should not father a child while receiving treatment. What side effects may I notice from receiving this medicine? Side  effects that you should report to your doctor or health care professional as soon as possible: -allergic reactions like skin rash, itching or hives, swelling of the face, lips, or tongue -low blood counts - this medicine may decrease the number of white blood cells, red blood cells and platelets. You may be at increased risk for infections and bleeding. -signs of infection - fever or chills, cough, sore throat, pain or difficulty passing urine -signs of decreased platelets or bleeding - bruising, pinpoint red spots on the skin, black, tarry stools, blood in the urine -signs of decreased red blood cells - unusually weak or tired, fainting spells, lightheadedness -reactions at the injection site including redness, pain, itching, or bruising -breathing problems -changes in vision -fever -mouth sores -stomach pain -vomiting Side effects that usually do not require medical attention (report to your doctor or health care professional if they continue or are bothersome): -constipation -diarrhea -loss of appetite -nausea -pain or redness at the injection site -weak or tired This list may not describe all possible side effects. Call your doctor for medical advice about side effects. You may report side effects to FDA at 1-800-FDA-1088. Where should I keep my medicine? This drug is given in a hospital or clinic and will not be stored at home. NOTE: This sheet is a summary. It may not cover all possible information. If you have questions about this medicine, talk to your doctor, pharmacist, or health care provider.  2015, [employer]. (2007-03-26 11:04:07)

## 2014-06-09 ENCOUNTER — Other Ambulatory Visit (HOSPITAL_BASED_OUTPATIENT_CLINIC_OR_DEPARTMENT_OTHER): Payer: Medicare Other

## 2014-06-09 ENCOUNTER — Ambulatory Visit (HOSPITAL_BASED_OUTPATIENT_CLINIC_OR_DEPARTMENT_OTHER): Payer: Medicare Other

## 2014-06-09 VITALS — BP 124/58 | HR 62 | Temp 98.1°F | Resp 18

## 2014-06-09 DIAGNOSIS — Z5111 Encounter for antineoplastic chemotherapy: Secondary | ICD-10-CM | POA: Diagnosis not present

## 2014-06-09 DIAGNOSIS — D46Z Other myelodysplastic syndromes: Secondary | ICD-10-CM

## 2014-06-09 DIAGNOSIS — D61818 Other pancytopenia: Secondary | ICD-10-CM | POA: Diagnosis present

## 2014-06-09 LAB — CBC WITH DIFFERENTIAL (CANCER CENTER ONLY)
HCT: 20.4 % — ABNORMAL LOW (ref 38.7–49.9)
HGB: 6.8 g/dL — CL (ref 13.0–17.1)
MCH: 30.4 pg (ref 28.0–33.4)
MCHC: 33.3 g/dL (ref 32.0–35.9)
MCV: 91 fL (ref 82–98)
Platelets: 162 10*3/uL (ref 145–400)
RBC: 2.24 10*6/uL — AB (ref 4.20–5.70)
RDW: 19.3 % — ABNORMAL HIGH (ref 11.1–15.7)
WBC: 7.1 10*3/uL (ref 4.0–10.0)

## 2014-06-09 LAB — MANUAL DIFFERENTIAL (CHCC SATELLITE)
ALC: 1.4 10*3/uL (ref 0.9–3.3)
ANC (CHCC HP manual diff): 5.3 10*3/uL (ref 1.5–6.5)
LYMPH: 20 % (ref 14–48)
MONO: 5 % (ref 0–13)
PLT EST ~~LOC~~: ADEQUATE
Platelet Morphology: NORMAL
SEG: 75 % (ref 40–75)

## 2014-06-09 LAB — HOLD TUBE, BLOOD BANK - CHCC SATELLITE

## 2014-06-09 MED ORDER — ONDANSETRON HCL 8 MG PO TABS
8.0000 mg | ORAL_TABLET | Freq: Once | ORAL | Status: AC
  Administered 2014-06-09: 8 mg via ORAL

## 2014-06-09 MED ORDER — FAMOTIDINE IN NACL 20-0.9 MG/50ML-% IV SOLN
INTRAVENOUS | Status: AC
  Filled 2014-06-09: qty 100

## 2014-06-09 MED ORDER — FAMOTIDINE IN NACL 20-0.9 MG/50ML-% IV SOLN
40.0000 mg | Freq: Once | INTRAVENOUS | Status: AC
Start: 2014-06-09 — End: 2014-06-09
  Administered 2014-06-09: 40 mg via INTRAVENOUS

## 2014-06-09 MED ORDER — ONDANSETRON HCL 8 MG PO TABS
ORAL_TABLET | ORAL | Status: AC
  Filled 2014-06-09: qty 1

## 2014-06-09 MED ORDER — METHYLPREDNISOLONE SODIUM SUCC 125 MG IJ SOLR
125.0000 mg | Freq: Once | INTRAMUSCULAR | Status: AC
  Administered 2014-06-09: 125 mg via INTRAVENOUS

## 2014-06-09 MED ORDER — AZACITIDINE CHEMO SQ INJECTION
76.0000 mg/m2 | Freq: Once | INTRAMUSCULAR | Status: AC
  Administered 2014-06-09: 160 mg via SUBCUTANEOUS
  Filled 2014-06-09: qty 6.4

## 2014-06-09 MED ORDER — METHYLPREDNISOLONE SODIUM SUCC 125 MG IJ SOLR
INTRAMUSCULAR | Status: AC
  Filled 2014-06-09: qty 2

## 2014-06-09 NOTE — Patient Instructions (Signed)
Azacitidine suspension for injection (subcutaneous use) What is this medicine? AZACITIDINE (ay [place]) is a chemotherapy drug. This medicine reduces the growth of cancer cells and can suppress the immune system. It is used for treating myelodysplastic syndrome or some types of leukemia. This medicine may be used for other purposes; ask your health care provider or pharmacist if you have questions. COMMON BRAND NAME(S): Vidaza What should I tell my health care provider before I take this medicine? They need to know if you have any of these conditions: -infection (especially a virus infection such as chickenpox, cold sores, or herpes) -kidney disease -liver disease -liver tumors -an unusual or allergic reaction to azacitidine, mannitol, other medicines, foods, dyes, or preservatives -pregnant or trying to get pregnant -breast-feeding How should I use this medicine? This medicine is for injection under the skin. It is administered in a hospital or clinic by a specially trained health care professional. Talk to your pediatrician regarding the use of this medicine in children. While this drug may be prescribed for selected conditions, precautions do apply. Overdosage: If you think you have taken too much of this medicine contact a poison control center or emergency room at once. NOTE: This medicine is only for you. Do not share this medicine with others. What if I miss a dose? It is important not to miss your dose. Call your doctor or health care professional if you are unable to keep an appointment. What may interact with this medicine? -vaccines Talk to your doctor or health care professional before taking any of these medicines: -acetaminophen -aspirin -ibuprofen -ketoprofen -naproxen This list may not describe all possible interactions. Give your health care provider a list of all the medicines, herbs, non-prescription drugs, or dietary supplements you use. Also tell them if you  smoke, drink alcohol, or use illegal drugs. Some items may interact with your medicine. What should I watch for while using this medicine? Visit your doctor for checks on your progress. This drug may make you feel generally unwell. This is not uncommon, as chemotherapy can affect healthy cells as well as cancer cells. Report any side effects. Continue your course of treatment even though you feel ill unless your doctor tells you to stop. In some cases, you may be given additional medicines to help with side effects. Follow all directions for their use. Call your doctor or health care professional for advice if you get a fever, chills or sore throat, or other symptoms of a cold or flu. Do not treat yourself. This drug decreases your body's ability to fight infections. Try to avoid being around people who are sick. This medicine may increase your risk to bruise or bleed. Call your doctor or health care professional if you notice any unusual bleeding. Be careful brushing and flossing your teeth or using a toothpick because you may get an infection or bleed more easily. If you have any dental work done, tell your dentist you are receiving this medicine. Avoid taking products that contain aspirin, acetaminophen, ibuprofen, naproxen, or ketoprofen unless instructed by your doctor. These medicines may hide a fever. Do not have any vaccinations without your doctor's approval and avoid anyone who has recently had oral polio vaccine. Do not become pregnant while taking this medicine. Women should inform their doctor if they wish to become pregnant or think they might be pregnant. There is a potential for serious side effects to an unborn child. Talk to your health care professional or pharmacist for more information.  Do not breast-feed an infant while taking this medicine. If you are a man, you should not father a child while receiving treatment. What side effects may I notice from receiving this medicine? Side  effects that you should report to your doctor or health care professional as soon as possible: -allergic reactions like skin rash, itching or hives, swelling of the face, lips, or tongue -low blood counts - this medicine may decrease the number of white blood cells, red blood cells and platelets. You may be at increased risk for infections and bleeding. -signs of infection - fever or chills, cough, sore throat, pain or difficulty passing urine -signs of decreased platelets or bleeding - bruising, pinpoint red spots on the skin, black, tarry stools, blood in the urine -signs of decreased red blood cells - unusually weak or tired, fainting spells, lightheadedness -reactions at the injection site including redness, pain, itching, or bruising -breathing problems -changes in vision -fever -mouth sores -stomach pain -vomiting Side effects that usually do not require medical attention (report to your doctor or health care professional if they continue or are bothersome): -constipation -diarrhea -loss of appetite -nausea -pain or redness at the injection site -weak or tired This list may not describe all possible side effects. Call your doctor for medical advice about side effects. You may report side effects to FDA at 1-800-FDA-1088. Where should I keep my medicine? This drug is given in a hospital or clinic and will not be stored at home. NOTE: This sheet is a summary. It may not cover all possible information. If you have questions about this medicine, talk to your doctor, pharmacist, or health care provider.  2015, [employer]. (2007-03-26 11:04:07)

## 2014-06-10 ENCOUNTER — Ambulatory Visit (HOSPITAL_COMMUNITY)
Admission: RE | Admit: 2014-06-10 | Discharge: 2014-06-10 | Disposition: A | Discharge status: Home / Self Care | Payer: Medicare Other | Source: Ambulatory Visit | Attending: Hematology & Oncology | Admitting: Hematology & Oncology

## 2014-06-10 ENCOUNTER — Ambulatory Visit (HOSPITAL_BASED_OUTPATIENT_CLINIC_OR_DEPARTMENT_OTHER): Payer: Medicare Other

## 2014-06-10 ENCOUNTER — Encounter (HOSPITAL_COMMUNITY): Payer: Self-pay | Admitting: *Deleted

## 2014-06-10 ENCOUNTER — Emergency Department (HOSPITAL_COMMUNITY): Payer: Medicare Other

## 2014-06-10 ENCOUNTER — Emergency Department (HOSPITAL_COMMUNITY)
Admission: EM | Admit: 2014-06-10 | Discharge: 2014-06-11 | Disposition: A | Discharge status: Home / Self Care | Payer: Medicare Other | Attending: Emergency Medicine | Admitting: Emergency Medicine

## 2014-06-10 VITALS — BP 103/61 | HR 57 | Temp 97.9°F | Resp 18

## 2014-06-10 DIAGNOSIS — I2510 Atherosclerotic heart disease of native coronary artery without angina pectoris: Secondary | ICD-10-CM | POA: Diagnosis not present

## 2014-06-10 DIAGNOSIS — Z5111 Encounter for antineoplastic chemotherapy: Secondary | ICD-10-CM

## 2014-06-10 DIAGNOSIS — D61818 Other pancytopenia: Secondary | ICD-10-CM

## 2014-06-10 DIAGNOSIS — Z720 Tobacco use: Secondary | ICD-10-CM | POA: Insufficient documentation

## 2014-06-10 DIAGNOSIS — E780 Pure hypercholesterolemia: Secondary | ICD-10-CM | POA: Diagnosis not present

## 2014-06-10 DIAGNOSIS — K219 Gastro-esophageal reflux disease without esophagitis: Secondary | ICD-10-CM | POA: Insufficient documentation

## 2014-06-10 DIAGNOSIS — Z9861 Coronary angioplasty status: Secondary | ICD-10-CM | POA: Insufficient documentation

## 2014-06-10 DIAGNOSIS — Z872 Personal history of diseases of the skin and subcutaneous tissue: Secondary | ICD-10-CM | POA: Insufficient documentation

## 2014-06-10 DIAGNOSIS — Z79899 Other long term (current) drug therapy: Secondary | ICD-10-CM | POA: Diagnosis not present

## 2014-06-10 DIAGNOSIS — Z7982 Long term (current) use of aspirin: Secondary | ICD-10-CM | POA: Insufficient documentation

## 2014-06-10 DIAGNOSIS — D46Z Other myelodysplastic syndromes: Secondary | ICD-10-CM | POA: Insufficient documentation

## 2014-06-10 DIAGNOSIS — I10 Essential (primary) hypertension: Secondary | ICD-10-CM | POA: Insufficient documentation

## 2014-06-10 DIAGNOSIS — K5900 Constipation, unspecified: Secondary | ICD-10-CM | POA: Diagnosis not present

## 2014-06-10 DIAGNOSIS — M1990 Unspecified osteoarthritis, unspecified site: Secondary | ICD-10-CM | POA: Insufficient documentation

## 2014-06-10 LAB — PREPARE RBC (CROSSMATCH)

## 2014-06-10 MED ORDER — ACETAMINOPHEN 325 MG PO TABS
ORAL_TABLET | ORAL | Status: AC
  Filled 2014-06-10: qty 2

## 2014-06-10 MED ORDER — METHYLPREDNISOLONE SODIUM SUCC 125 MG IJ SOLR
INTRAMUSCULAR | Status: AC
  Filled 2014-06-10: qty 2

## 2014-06-10 MED ORDER — FUROSEMIDE 10 MG/ML IJ SOLN
INTRAMUSCULAR | Status: AC
  Filled 2014-06-10: qty 4

## 2014-06-10 MED ORDER — FAMOTIDINE IN NACL 20-0.9 MG/50ML-% IV SOLN
40.0000 mg | Freq: Once | INTRAVENOUS | Status: AC
  Administered 2014-06-10: 40 mg via INTRAVENOUS

## 2014-06-10 MED ORDER — ACETAMINOPHEN 325 MG PO TABS
650.0000 mg | ORAL_TABLET | Freq: Once | ORAL | Status: AC
  Administered 2014-06-10: 650 mg via ORAL

## 2014-06-10 MED ORDER — SODIUM CHLORIDE 0.9 % IV SOLN
250.0000 mL | Freq: Once | INTRAVENOUS | Status: AC
  Administered 2014-06-10: 250 mL via INTRAVENOUS

## 2014-06-10 MED ORDER — DIPHENHYDRAMINE HCL 25 MG PO CAPS
ORAL_CAPSULE | ORAL | Status: AC
  Filled 2014-06-10: qty 1

## 2014-06-10 MED ORDER — DIPHENHYDRAMINE HCL 25 MG PO CAPS
25.0000 mg | ORAL_CAPSULE | Freq: Once | ORAL | Status: AC
  Administered 2014-06-10: 25 mg via ORAL

## 2014-06-10 MED ORDER — ONDANSETRON HCL 8 MG PO TABS
8.0000 mg | ORAL_TABLET | Freq: Once | ORAL | Status: AC
  Administered 2014-06-10: 8 mg via ORAL

## 2014-06-10 MED ORDER — METHYLPREDNISOLONE SODIUM SUCC 125 MG IJ SOLR
125.0000 mg | Freq: Once | INTRAMUSCULAR | Status: AC
  Administered 2014-06-10: 125 mg via INTRAVENOUS

## 2014-06-10 MED ORDER — ONDANSETRON HCL 8 MG PO TABS
ORAL_TABLET | ORAL | Status: AC
  Filled 2014-06-10: qty 1

## 2014-06-10 MED ORDER — FAMOTIDINE IN NACL 20-0.9 MG/50ML-% IV SOLN
INTRAVENOUS | Status: AC
  Filled 2014-06-10: qty 100

## 2014-06-10 MED ORDER — FUROSEMIDE 10 MG/ML IJ SOLN
20.0000 mg | Freq: Once | INTRAMUSCULAR | Status: AC
  Administered 2014-06-10: 20 mg via INTRAVENOUS

## 2014-06-10 MED ORDER — AZACITIDINE CHEMO SQ INJECTION
76.0000 mg/m2 | Freq: Once | INTRAMUSCULAR | Status: AC
  Administered 2014-06-10: 160 mg via SUBCUTANEOUS
  Filled 2014-06-10: qty 6.4

## 2014-06-10 NOTE — Patient Instructions (Signed)

## 2014-06-10 NOTE — ED Notes (Signed)
Pt went to X-ray.   

## 2014-06-10 NOTE — ED Provider Notes (Signed)
CSN: [id number]     Arrival date & time 06/10/14  1952 History   First MD Initiated Contact with Patient 06/10/14 2212     Chief Complaint  Patient presents with  . Constipation     (Consider location/radiation/quality/duration/timing/severity/associated sxs/prior Treatment) HPI Comments: Patient is a 69 year old male with history of myelodysplastic syndrome. He presents for evaluation of constipation. He is currently undergoing chemotherapy. He presents with an inability to move his bowels for the past 3 days. Feels as though he needs to go, but he can't. He has tried multiple laxatives with minimal relief.  Patient is a 69 y.o. male presenting with constipation. The history is provided by the patient.  Constipation Severity:  Moderate Time since last bowel movement:  3 days Timing:  Constant Progression:  Worsening Chronicity:  New Context: medication   Context: not dehydration   Stool description:  None produced Relieved by:  Nothing Worsened by:  Nothing tried   Past Medical History  Diagnosis Date  . Coronary atherosclerosis of native coronary artery     a. 10/2008 inf STEMI/PCI: LM 50d (IVUS-borderline lesion->med rx), LAD min irregs, LCX 50m, 70d, OM nl, RCA 141m (3.5x28 Vision BMS);  b. 11/2008 Lexiscan MV: EF 65%, no isch/scar.  . Essential hypertension, benign   . Pure hypercholesterolemia   . AAA (abdominal aortic aneurysm)     a. 05/2013 CT: 5.8 cm AAA.  Marland Kitchen Diverticulitis     a. 05/2013 CT: descending/sigmoid jxn w/o abscess.  . Osteoarthritis   . Tobacco abuse     a. ongoing - 1ppd for better part of 50 yrs.  . Normocytic anemia   . Cellulitis 06/10/2013    Right antecubital fossa at site of IV  03/12/13  . PONV (postoperative nausea and vomiting)   . Diverticulitis   . GERD (gastroesophageal reflux disease)   . MDS (myelodysplastic syndrome), high grade 04/13/2014   Past Surgical History  Procedure Laterality Date  . Cardiac stents  2010  . Cholecystectomy  N/A 06/19/2013    Procedure: LAPAROSCOPIC CHOLECYSTECTOMY;  Surgeon: Harl Bowie, MD;  Location: [place];  Service: General;  Laterality: N/A;  . Abdominal aortic endovascular stent graft N/A 07/02/2013    Procedure: ABDOMINAL AORTIC ENDOVASCULAR STENT GRAFT;  Surgeon: Serafina Mitchell, MD;  Location: [facility] OR;  Service: Vascular;  Laterality: N/A;  . Esophagogastroduodenoscopy N/A 08/05/2013    Procedure: ESOPHAGOGASTRODUODENOSCOPY (EGD);  Surgeon: Lear Ng, MD;  Location: [facility] ENDOSCOPY;  Service: Endoscopy;  Laterality: N/A;  . Eye surgery Left     cataract surgery  . Back surgery      cervical fusion  . Colonoscopy with propofol N/A 10/15/2013    Procedure: COLONOSCOPY WITH PROPOFOL;  Surgeon: Lear Ng, MD;  Location: [place];  Service: Endoscopy;  Laterality: N/A;  . Fetal blood transfusion  March 16,17,18, 2016  . Bone marrow biopsy  April 01, 2014   Family History  Problem Relation Age of Onset  . Heart attack Brother     21s  . Cancer Father     Lung  . Cancer Mother     Brain  . Cancer Brother     Kidney   History  Substance Use Topics  . Smoking status: Current Every Day Smoker -- 1.00 packs/day for 60 years    Types: Cigarettes    Start date: 04/05/1964  . Smokeless tobacco: Never Used     Comment: 5-23--   STILL SMOKING  . Alcohol Use: No  Review of Systems  Gastrointestinal: Positive for constipation.  All other systems reviewed and are negative.     Allergies  Vicodin; Cephalexin; and Phenergan  Home Medications   Prior to Admission medications   Medication Sig Start Date End Date Taking? Authorizing Provider  acetaminophen (TYLENOL) 325 MG tablet Take 325 mg by mouth every 6 (six) hours as needed for moderate pain.    Historical Provider, MD  aspirin EC 81 MG tablet Take 1 tablet (81 mg total) by mouth daily. 03/30/14   Sherren Mocha, MD  B Complex Vitamins (VITAMIN-B COMPLEX PO) Take by mouth daily.    Historical Provider, MD   clindamycin (CLEOCIN) 300 MG capsule Take 2 capsules 60 minutes before dental procedure. 05/09/14   Volanda Napoleon, MD  LORazepam (ATIVAN) 0.5 MG tablet Take 1 tablet (0.5 mg total) by mouth every 6 (six) hours as needed (Nausea or vomiting). 04/11/14   Volanda Napoleon, MD  metoprolol tartrate (LOPRESSOR) 25 MG tablet Take 0.5 tablets (12.5 mg total) by mouth 2 (two) times daily. 08/03/13   Sherren Mocha, MD  nitroGLYCERIN (NITROSTAT) 0.4 MG SL tablet Place 1 tablet (0.4 mg total) under the tongue every 5 (five) minutes as needed for chest pain. 03/29/14   Sherren Mocha, MD  pantoprazole (PROTONIX) 40 MG tablet Take 40 mg by mouth daily. 08/06/13   Nita Sells, MD  predniSONE (DELTASONE) 20 MG tablet Take 2 pills a day for 3 days, then 2 pills a day for 3 days, then 1 pill a day for 3 days, then 1/2 pill a day for 5 days 05/09/14   Volanda Napoleon, MD  prochlorperazine (COMPAZINE) 10 MG tablet Take 1 tablet (10 mg total) by mouth every 6 (six) hours as needed (Nausea or vomiting). Patient not taking: Reported on 06/06/2014 04/11/14   Volanda Napoleon, MD  simvastatin (ZOCOR) 20 MG tablet Take 1 tablet (20 mg total) by mouth at bedtime. 03/30/14   Sherren Mocha, MD  temazepam (RESTORIL) 15 MG capsule Take 1 capsule (15 mg total) by mouth at bedtime as needed for sleep. 04/15/14   Volanda Napoleon, MD   BP 144/71 mmHg  Pulse 57  Temp(Src) 97.8 F (36.6 C) (Oral)  Resp 16  Ht $R'5\' 10"'cc$  (1.778 m)  Wt 193 lb (87.544 kg)  BMI 27.69 kg/m2  SpO2 99% Physical Exam  Constitutional: He is oriented to person, place, and time. He appears well-developed and well-nourished. No distress.  HENT:  Head: Normocephalic and atraumatic.  Neck: Normal range of motion. Neck supple.  Cardiovascular: Normal rate, regular rhythm and normal heart sounds.   No murmur heard. Pulmonary/Chest: Effort normal and breath sounds normal. No respiratory distress. He has no wheezes.  Abdominal: Soft. Bowel sounds are normal. He  exhibits no distension. There is no tenderness. There is no rebound.  Genitourinary: Rectum normal.  There is moderate stool in the rectal vault, however no firm impaction.  Musculoskeletal: Normal range of motion. He exhibits no edema.  Neurological: He is alert and oriented to person, place, and time.  Skin: Skin is warm and dry. He is not diaphoretic.  Nursing note and vitals reviewed.   ED Course  Procedures (including critical care time) Labs Review Labs Reviewed - No data to display  Imaging Review No results found.   EKG Interpretation None      MDM   Final diagnoses:  None    Patient with history of myelodysplasia receiving chemotherapy. He presents with constipation. He is  unable to have a bowel movement for the past 3 days and has tried multiple laxitives with no relief. KUB today reveals retained stool, however no evidence for bowel obstruction. While awaiting a soapsuds enema, the patient had a large bowel movement on his own and now feels much better. He will be discharged to home with instructions to return as needed for any problems.    Veryl Speak, MD 06/11/14 [phone]

## 2014-06-10 NOTE — ED Notes (Signed)
Pt c/o severe constipation for three days. Pt is a chemo patient, last chemo was today. Pt was given tylenol and steroids today. Pt is on chemo for MDS. Pt denies n/v.

## 2014-06-11 LAB — TYPE AND SCREEN
ABO/RH(D): A POS
Antibody Screen: NEGATIVE
UNIT DIVISION: 0
UNIT DIVISION: 0

## 2014-06-11 NOTE — Discharge Instructions (Signed)
Eat plenty of fiber and drink plenty of fluids.  Return to the emergency department if your symptoms significantly worsen or change.   Constipation Constipation is when a person has fewer than three bowel movements a week, has difficulty having a bowel movement, or has stools that are dry, hard, or larger than normal. As people grow older, constipation is more common. If you try to fix constipation with medicines that make you have a bowel movement (laxatives), the problem may get worse. Long-term laxative use may cause the muscles of the colon to become weak. A low-fiber diet, not taking in enough fluids, and taking certain medicines may make constipation worse.  CAUSES   Certain medicines, such as antidepressants, pain medicine, iron supplements, antacids, and water pills.   Certain diseases, such as diabetes, irritable bowel syndrome (IBS), thyroid disease, or depression.   Not drinking enough water.   Not eating enough fiber-rich foods.   Stress or travel.   Lack of physical activity or exercise.   Ignoring the urge to have a bowel movement.   Using laxatives too much.  SIGNS AND SYMPTOMS   Having fewer than three bowel movements a week.   Straining to have a bowel movement.   Having stools that are hard, dry, or larger than normal.   Feeling full or bloated.   Pain in the lower abdomen.   Not feeling relief after having a bowel movement.  DIAGNOSIS  Your health care provider will take a medical history and perform a physical exam. Further testing may be done for severe constipation. Some tests may include:  A barium enema X-ray to examine your rectum, colon, and, sometimes, your small intestine.   A sigmoidoscopy to examine your lower colon.   A colonoscopy to examine your entire colon. TREATMENT  Treatment will depend on the severity of your constipation and what is causing it. Some dietary treatments include drinking more fluids and eating more  fiber-rich foods. Lifestyle treatments may include regular exercise. If these diet and lifestyle recommendations do not help, your health care provider may recommend taking over-the-counter laxative medicines to help you have bowel movements. Prescription medicines may be prescribed if over-the-counter medicines do not work.  HOME CARE INSTRUCTIONS   Eat foods that have a lot of fiber, such as fruits, vegetables, whole grains, and beans.  Limit foods high in fat and processed sugars, such as french fries, hamburgers, cookies, candies, and soda.   A fiber supplement may be added to your diet if you cannot get enough fiber from foods.   Drink enough fluids to keep your urine clear or pale yellow.   Exercise regularly or as directed by your health care provider.   Go to the restroom when you have the urge to go. Do not hold it.   Only take over-the-counter or prescription medicines as directed by your health care provider. Do not take other medicines for constipation without talking to your health care provider first.  [place] IF:   You have bright red blood in your stool.   Your constipation lasts for more than 4 days or gets worse.   You have abdominal or rectal pain.   You have thin, pencil-like stools.   You have unexplained weight loss. MAKE SURE YOU:   Understand these instructions.  Will watch your condition.  Will get help right away if you are not doing well or get worse. Document Released: 09/29/2003 Document Revised: 01/05/2013 Document Reviewed: 10/12/2012 ExitCare Patient Information 2015  [employer]. This information is not intended to replace advice given to you by your health care provider. Make sure you discuss any questions you have with your health care provider.

## 2014-06-13 ENCOUNTER — Encounter: Payer: Self-pay | Admitting: Hematology & Oncology

## 2014-06-14 ENCOUNTER — Other Ambulatory Visit: Payer: Medicare Other

## 2014-06-15 ENCOUNTER — Other Ambulatory Visit (HOSPITAL_BASED_OUTPATIENT_CLINIC_OR_DEPARTMENT_OTHER): Payer: Medicare Other

## 2014-06-15 DIAGNOSIS — D4620 Refractory anemia with excess of blasts, unspecified: Secondary | ICD-10-CM

## 2014-06-15 DIAGNOSIS — D46Z Other myelodysplastic syndromes: Secondary | ICD-10-CM | POA: Diagnosis present

## 2014-06-15 LAB — CBC WITH DIFFERENTIAL (CANCER CENTER ONLY)
HCT: 22.7 % — ABNORMAL LOW (ref 38.7–49.9)
HGB: 7.6 g/dL — ABNORMAL LOW (ref 13.0–17.1)
MCH: 29.2 pg (ref 28.0–33.4)
MCHC: 33.5 g/dL (ref 32.0–35.9)
MCV: 87 fL (ref 82–98)
Platelets: 138 10*3/uL — ABNORMAL LOW (ref 145–400)
RBC: 2.6 10*6/uL — AB (ref 4.20–5.70)
RDW: 17.9 % — ABNORMAL HIGH (ref 11.1–15.7)
WBC: 4.5 10*3/uL (ref 4.0–10.0)

## 2014-06-15 LAB — MANUAL DIFFERENTIAL (CHCC SATELLITE)
ALC: 1.4 10*3/uL (ref 0.9–3.3)
ANC (CHCC MAN DIFF): 2.6 10*3/uL (ref 1.5–6.5)
LYMPH: 31 % (ref 14–48)
MONO: 10 % (ref 0–13)
MYELOCYTES: 1 % — AB (ref 0–0)
PLT EST ~~LOC~~: DECREASED
SEG: 58 % (ref 40–75)

## 2014-06-21 ENCOUNTER — Other Ambulatory Visit (HOSPITAL_BASED_OUTPATIENT_CLINIC_OR_DEPARTMENT_OTHER): Payer: Medicare Other

## 2014-06-21 ENCOUNTER — Ambulatory Visit (HOSPITAL_BASED_OUTPATIENT_CLINIC_OR_DEPARTMENT_OTHER): Payer: Medicare Other

## 2014-06-21 VITALS — BP 134/63 | HR 55 | Temp 98.2°F | Resp 18

## 2014-06-21 DIAGNOSIS — D46Z Other myelodysplastic syndromes: Secondary | ICD-10-CM

## 2014-06-21 DIAGNOSIS — D4620 Refractory anemia with excess of blasts, unspecified: Secondary | ICD-10-CM

## 2014-06-21 LAB — MANUAL DIFFERENTIAL (CHCC SATELLITE)
ALC: 1.1 10*3/uL (ref 0.9–3.3)
ANC (CHCC HP manual diff): 1.7 10*3/uL (ref 1.5–6.5)
Band Neutrophils: 2 % (ref 0–10)
LYMPH: 33 % (ref 14–48)
MONO: 16 % — ABNORMAL HIGH (ref 0–13)
PLT EST ~~LOC~~: DECREASED
Platelet Morphology: NORMAL
SEG: 49 % (ref 40–75)

## 2014-06-21 LAB — CBC WITH DIFFERENTIAL (CANCER CENTER ONLY)
HCT: 22.6 % — ABNORMAL LOW (ref 38.7–49.9)
HEMOGLOBIN: 7.3 g/dL — AB (ref 13.0–17.1)
MCH: 29 pg (ref 28.0–33.4)
MCHC: 32.3 g/dL (ref 32.0–35.9)
MCV: 90 fL (ref 82–98)
Platelets: 76 10*3/uL — ABNORMAL LOW (ref 145–400)
RBC: 2.52 10*6/uL — AB (ref 4.20–5.70)
RDW: 18.2 % — ABNORMAL HIGH (ref 11.1–15.7)
WBC: 3.3 10*3/uL — AB (ref 4.0–10.0)

## 2014-06-21 LAB — RETICULOCYTES (CHCC)
ABS RETIC: 18.6 10*3/uL — AB (ref 19.0–186.0)
RBC.: 2.65 MIL/uL — ABNORMAL LOW (ref 4.22–5.81)
RETIC CT PCT: 0.7 % (ref 0.4–2.3)

## 2014-06-21 LAB — COMPREHENSIVE METABOLIC PANEL
ALBUMIN: 3.8 g/dL (ref 3.5–5.2)
ALK PHOS: 59 U/L (ref 39–117)
ALT: 19 U/L (ref 0–53)
AST: 15 U/L (ref 0–37)
BUN: 17 mg/dL (ref 6–23)
CHLORIDE: 105 meq/L (ref 96–112)
CO2: 24 mEq/L (ref 19–32)
Calcium: 8.9 mg/dL (ref 8.4–10.5)
Creatinine, Ser: 1.19 mg/dL (ref 0.50–1.35)
Glucose, Bld: 103 mg/dL — ABNORMAL HIGH (ref 70–99)
Potassium: 4.2 mEq/L (ref 3.5–5.3)
Sodium: 138 mEq/L (ref 135–145)
TOTAL PROTEIN: 6.8 g/dL (ref 6.0–8.3)
Total Bilirubin: 0.6 mg/dL (ref 0.2–1.2)

## 2014-06-21 LAB — FERRITIN CHCC: Ferritin: 744 ng/ml — ABNORMAL HIGH (ref 22–316)

## 2014-06-21 LAB — CHCC SATELLITE - SMEAR

## 2014-06-21 LAB — IRON AND TIBC CHCC
%SAT: 59 % — AB (ref 20–55)
IRON: 147 ug/dL (ref 42–163)
TIBC: 249 ug/dL (ref 202–409)
UIBC: 102 ug/dL — AB (ref 117–376)

## 2014-06-21 MED ORDER — DARBEPOETIN ALFA 500 MCG/ML IJ SOSY
500.0000 ug | PREFILLED_SYRINGE | Freq: Once | INTRAMUSCULAR | Status: AC
  Administered 2014-06-21: 500 ug via SUBCUTANEOUS

## 2014-06-21 MED ORDER — DARBEPOETIN ALFA 300 MCG/0.6ML IJ SOSY: 300.0000 ug | PREFILLED_SYRINGE | Freq: Once | INTRAMUSCULAR | Status: DC

## 2014-06-21 MED ORDER — DARBEPOETIN ALFA 500 MCG/ML IJ SOSY
PREFILLED_SYRINGE | INTRAMUSCULAR | Status: AC
  Filled 2014-06-21: qty 1

## 2014-06-21 NOTE — Patient Instructions (Signed)
Darbepoetin Alfa injection What is this medicine? DARBEPOETIN ALFA (dar be POE e tin AL fa) helps your body make more red blood cells. It is used to treat anemia caused by chronic kidney failure and chemotherapy. This medicine may be used for other purposes; ask your health care provider or pharmacist if you have questions. COMMON BRAND NAME(S): Aranesp What should I tell my health care provider before I take this medicine? They need to know if you have any of these conditions: -blood clotting disorders or history of blood clots -cancer patient not on chemotherapy -cystic fibrosis -heart disease, such as angina, heart failure, or a history of a heart attack -hemoglobin level of 12 g/dL or greater -high blood pressure -low levels of folate, iron, or vitamin B12 -seizures -an unusual or allergic reaction to darbepoetin, erythropoietin, albumin, hamster proteins, latex, other medicines, foods, dyes, or preservatives -pregnant or trying to get pregnant -breast-feeding How should I use this medicine? This medicine is for injection into a vein or under the skin. It is usually given by a health care professional in a hospital or clinic setting. If you get this medicine at home, you will be taught how to prepare and give this medicine. Do not shake the solution before you withdraw a dose. Use exactly as directed. Take your medicine at regular intervals. Do not take your medicine more often than directed. It is important that you put your used needles and syringes in a special sharps container. Do not put them in a trash can. If you do not have a sharps container, call your pharmacist or healthcare provider to get one. Talk to your pediatrician regarding the use of this medicine in children. While this medicine may be used in children as young as 1 year for selected conditions, precautions do apply. Overdosage: If you think you have taken too much of this medicine contact a poison control center or  emergency room at once. NOTE: This medicine is only for you. Do not share this medicine with others. What if I miss a dose? If you miss a dose, take it as soon as you can. If it is almost time for your next dose, take only that dose. Do not take double or extra doses. What may interact with this medicine? Do not take this medicine with any of the following medications: -epoetin alfa This list may not describe all possible interactions. Give your health care provider a list of all the medicines, herbs, non-prescription drugs, or dietary supplements you use. Also tell them if you smoke, drink alcohol, or use illegal drugs. Some items may interact with your medicine. What should I watch for while using this medicine? Visit your prescriber or health care professional for regular checks on your progress and for the needed blood tests and blood pressure measurements. It is especially important for the doctor to make sure your hemoglobin level is in the desired range, to limit the risk of potential side effects and to give you the best benefit. Keep all appointments for any recommended tests. Check your blood pressure as directed. Ask your doctor what your blood pressure should be and when you should contact him or her. As your body makes more red blood cells, you may need to take iron, folic acid, or vitamin B supplements. Ask your doctor or health care provider which products are right for you. If you have kidney disease continue dietary restrictions, even though this medication can make you feel better. Talk with your doctor or health   care professional about the foods you eat and the vitamins that you take. What side effects may I notice from receiving this medicine? Side effects that you should report to your doctor or health care professional as soon as possible: -allergic reactions like skin rash, itching or hives, swelling of the face, lips, or tongue -breathing problems -changes in vision -chest  pain -confusion, trouble speaking or understanding -feeling faint or lightheaded, falls -high blood pressure -muscle aches or pains -pain, swelling, warmth in the leg -rapid weight gain -severe headaches -sudden numbness or weakness of the face, arm or leg -trouble walking, dizziness, loss of balance or coordination -seizures (convulsions) -swelling of the ankles, feet, hands -unusually weak or tired Side effects that usually do not require medical attention (report to your doctor or health care professional if they continue or are bothersome): -diarrhea -fever, chills (flu-like symptoms) -headaches -nausea, vomiting -redness, stinging, or swelling at site where injected This list may not describe all possible side effects. Call your doctor for medical advice about side effects. You may report side effects to FDA at 1-800-FDA-1088. Where should I keep my medicine? Keep out of the reach of children. Store in a refrigerator between 2 and 8 degrees C (36 and 46 degrees F). Do not freeze. Do not shake. Throw away any unused portion if using a single-dose vial. Throw away any unused medicine after the expiration date. NOTE: This sheet is a summary. It may not cover all possible information. If you have questions about this medicine, talk to your doctor, pharmacist, or health care provider.  2015, [employer]. (2007-12-15 10:23:57)  

## 2014-06-27 ENCOUNTER — Encounter: Payer: Self-pay | Admitting: Hematology & Oncology

## 2014-06-27 ENCOUNTER — Other Ambulatory Visit (HOSPITAL_BASED_OUTPATIENT_CLINIC_OR_DEPARTMENT_OTHER): Payer: Medicare Other

## 2014-06-27 ENCOUNTER — Encounter: Payer: Self-pay | Admitting: *Deleted

## 2014-06-27 DIAGNOSIS — D4620 Refractory anemia with excess of blasts, unspecified: Secondary | ICD-10-CM

## 2014-06-27 LAB — CBC WITH DIFFERENTIAL (CANCER CENTER ONLY)
HCT: 21.2 % — ABNORMAL LOW (ref 38.7–49.9)
HGB: 6.8 g/dL — CL (ref 13.0–17.1)
MCH: 28.6 pg (ref 28.0–33.4)
MCHC: 32.1 g/dL (ref 32.0–35.9)
MCV: 89 fL (ref 82–98)
Platelets: 107 10*3/uL — ABNORMAL LOW (ref 145–400)
RBC: 2.38 10*6/uL — ABNORMAL LOW (ref 4.20–5.70)
RDW: 18.3 % — AB (ref 11.1–15.7)
WBC: 4.9 10*3/uL (ref 4.0–10.0)

## 2014-06-27 LAB — MANUAL DIFFERENTIAL (CHCC SATELLITE)
ALC: 0.9 10*3/uL (ref 0.9–3.3)
ANC (CHCC MAN DIFF): 3.5 10*3/uL (ref 1.5–6.5)
Band Neutrophils: 1 % (ref 0–10)
Eos: 1 % (ref 0–7)
LYMPH: 19 % (ref 14–48)
MONO: 8 % (ref 0–13)
Myelocytes: 1 % — ABNORMAL HIGH (ref 0–0)
PLT EST ~~LOC~~: DECREASED
SEG: 70 % (ref 40–75)

## 2014-06-27 NOTE — Progress Notes (Signed)
Patient hgb 6.8.  Patient tired but not SOB.  Wants to wait till next week to have blood rechecked.  Will call us later this week if feels as if he needs to have blood checked earlier

## 2014-06-28 ENCOUNTER — Ambulatory Visit (HOSPITAL_COMMUNITY)
Admission: RE | Admit: 2014-06-28 | Discharge: 2014-06-28 | Disposition: A | Discharge status: Home / Self Care | Payer: Medicare Other | Source: Ambulatory Visit | Attending: Hematology & Oncology | Admitting: Hematology & Oncology

## 2014-06-28 ENCOUNTER — Other Ambulatory Visit: Payer: Medicare Other

## 2014-06-28 ENCOUNTER — Encounter: Payer: Self-pay | Admitting: Hematology & Oncology

## 2014-06-28 DIAGNOSIS — D46Z Other myelodysplastic syndromes: Secondary | ICD-10-CM

## 2014-06-28 LAB — PREPARE RBC (CROSSMATCH)

## 2014-06-29 ENCOUNTER — Ambulatory Visit (HOSPITAL_BASED_OUTPATIENT_CLINIC_OR_DEPARTMENT_OTHER): Payer: Medicare Other

## 2014-06-29 VITALS — BP 102/57 | HR 54 | Temp 97.8°F | Resp 18

## 2014-06-29 DIAGNOSIS — D4620 Refractory anemia with excess of blasts, unspecified: Secondary | ICD-10-CM

## 2014-06-29 DIAGNOSIS — D46Z Other myelodysplastic syndromes: Secondary | ICD-10-CM

## 2014-06-29 LAB — HOLD TUBE, BLOOD BANK - CHCC SATELLITE

## 2014-06-29 MED ORDER — DIPHENHYDRAMINE HCL 25 MG PO CAPS
25.0000 mg | ORAL_CAPSULE | Freq: Once | ORAL | Status: AC
  Administered 2014-06-29: 25 mg via ORAL

## 2014-06-29 MED ORDER — SODIUM CHLORIDE 0.9 % IV SOLN
250.0000 mL | Freq: Once | INTRAVENOUS | Status: AC
  Administered 2014-06-29: 250 mL via INTRAVENOUS

## 2014-06-29 MED ORDER — FUROSEMIDE 10 MG/ML IJ SOLN
20.0000 mg | Freq: Once | INTRAMUSCULAR | Status: AC
  Administered 2014-06-29: 20 mg via INTRAVENOUS

## 2014-06-29 MED ORDER — FUROSEMIDE 10 MG/ML IJ SOLN
INTRAMUSCULAR | Status: AC
  Filled 2014-06-29: qty 4

## 2014-06-29 MED ORDER — ACETAMINOPHEN 325 MG PO TABS
650.0000 mg | ORAL_TABLET | Freq: Once | ORAL | Status: AC
  Administered 2014-06-29: 650 mg via ORAL

## 2014-06-29 MED ORDER — DIPHENHYDRAMINE HCL 25 MG PO CAPS
ORAL_CAPSULE | ORAL | Status: AC
  Filled 2014-06-29: qty 1

## 2014-06-29 MED ORDER — ACETAMINOPHEN 325 MG PO TABS
ORAL_TABLET | ORAL | Status: AC
  Filled 2014-06-29: qty 2

## 2014-06-29 NOTE — Patient Instructions (Signed)

## 2014-06-30 LAB — TYPE AND SCREEN
ABO/RH(D): A POS
Antibody Screen: NEGATIVE
UNIT DIVISION: 0
Unit division: 0

## 2014-07-04 ENCOUNTER — Ambulatory Visit (HOSPITAL_BASED_OUTPATIENT_CLINIC_OR_DEPARTMENT_OTHER): Payer: Medicare Other | Admitting: Hematology & Oncology

## 2014-07-04 ENCOUNTER — Other Ambulatory Visit (HOSPITAL_BASED_OUTPATIENT_CLINIC_OR_DEPARTMENT_OTHER): Payer: Medicare Other

## 2014-07-04 ENCOUNTER — Ambulatory Visit (HOSPITAL_BASED_OUTPATIENT_CLINIC_OR_DEPARTMENT_OTHER): Payer: Medicare Other

## 2014-07-04 ENCOUNTER — Encounter: Payer: Self-pay | Admitting: Oncology

## 2014-07-04 VITALS — BP 123/67 | HR 59 | Temp 98.0°F | Resp 20 | Wt 191.0 lb

## 2014-07-04 DIAGNOSIS — D469 Myelodysplastic syndrome, unspecified: Secondary | ICD-10-CM

## 2014-07-04 DIAGNOSIS — D4620 Refractory anemia with excess of blasts, unspecified: Secondary | ICD-10-CM

## 2014-07-04 DIAGNOSIS — Z720 Tobacco use: Secondary | ICD-10-CM | POA: Diagnosis not present

## 2014-07-04 DIAGNOSIS — Z5111 Encounter for antineoplastic chemotherapy: Secondary | ICD-10-CM

## 2014-07-04 DIAGNOSIS — D61818 Other pancytopenia: Secondary | ICD-10-CM

## 2014-07-04 DIAGNOSIS — D46Z Other myelodysplastic syndromes: Secondary | ICD-10-CM

## 2014-07-04 LAB — MANUAL DIFFERENTIAL (CHCC SATELLITE)
ALC: 1 10*3/uL (ref 0.9–3.3)
ANC (CHCC HP manual diff): 3.9 10*3/uL (ref 1.5–6.5)
Band Neutrophils: 1 % (ref 0–10)
LYMPH: 19 % (ref 14–48)
MONO: 4 % (ref 0–13)
MYELOCYTES: 1 % — AB (ref 0–0)
PLT EST ~~LOC~~: DECREASED
Platelet Morphology: NORMAL
SEG: 75 % (ref 40–75)

## 2014-07-04 LAB — CBC WITH DIFFERENTIAL (CANCER CENTER ONLY)
HCT: 28.3 % — ABNORMAL LOW (ref 38.7–49.9)
HEMOGLOBIN: 9.4 g/dL — AB (ref 13.0–17.1)
MCH: 29.6 pg (ref 28.0–33.4)
MCHC: 33.2 g/dL (ref 32.0–35.9)
MCV: 89 fL (ref 82–98)
Platelets: 128 10*3/uL — ABNORMAL LOW (ref 145–400)
RBC: 3.18 10*6/uL — AB (ref 4.20–5.70)
RDW: 17.3 % — ABNORMAL HIGH (ref 11.1–15.7)
WBC: 5.1 10*3/uL (ref 4.0–10.0)

## 2014-07-04 MED ORDER — METHYLPREDNISOLONE SODIUM SUCC 125 MG IJ SOLR
INTRAMUSCULAR | Status: AC
  Filled 2014-07-04: qty 2

## 2014-07-04 MED ORDER — AZACITIDINE CHEMO SQ INJECTION
77.0000 mg/m2 | Freq: Once | INTRAMUSCULAR | Status: AC
  Administered 2014-07-04: 160 mg via SUBCUTANEOUS
  Filled 2014-07-04: qty 6.4

## 2014-07-04 MED ORDER — METHYLPREDNISOLONE SODIUM SUCC 125 MG IJ SOLR
125.0000 mg | Freq: Once | INTRAMUSCULAR | Status: AC
  Administered 2014-07-04: 125 mg via INTRAVENOUS

## 2014-07-04 MED ORDER — FAMOTIDINE IN NACL 20-0.9 MG/50ML-% IV SOLN
INTRAVENOUS | Status: AC
  Filled 2014-07-04: qty 100

## 2014-07-04 MED ORDER — ONDANSETRON HCL 8 MG PO TABS
8.0000 mg | ORAL_TABLET | Freq: Once | ORAL | Status: AC
  Administered 2014-07-04: 8 mg via ORAL

## 2014-07-04 MED ORDER — FAMOTIDINE IN NACL 20-0.9 MG/50ML-% IV SOLN
40.0000 mg | Freq: Once | INTRAVENOUS | Status: AC
  Administered 2014-07-04: 40 mg via INTRAVENOUS

## 2014-07-04 MED ORDER — ONDANSETRON HCL 8 MG PO TABS
ORAL_TABLET | ORAL | Status: AC
  Filled 2014-07-04: qty 1

## 2014-07-04 MED ORDER — SODIUM CHLORIDE 0.9 % IV SOLN
INTRAVENOUS | Status: DC
  Administered 2014-07-04: 10:00:00 via INTRAVENOUS

## 2014-07-04 NOTE — Progress Notes (Signed)
Per Dr. Marin Olp, he wants to continue Aranesp on Mr. Walle.

## 2014-07-04 NOTE — Patient Instructions (Signed)
Azacitidine suspension for injection (subcutaneous use) What is this medicine? AZACITIDINE (ay [place]) is a chemotherapy drug. This medicine reduces the growth of cancer cells and can suppress the immune system. It is used for treating myelodysplastic syndrome or some types of leukemia. This medicine may be used for other purposes; ask your health care provider or pharmacist if you have questions. COMMON BRAND NAME(S): Vidaza What should I tell my health care provider before I take this medicine? They need to know if you have any of these conditions: -infection (especially a virus infection such as chickenpox, cold sores, or herpes) -kidney disease -liver disease -liver tumors -an unusual or allergic reaction to azacitidine, mannitol, other medicines, foods, dyes, or preservatives -pregnant or trying to get pregnant -breast-feeding How should I use this medicine? This medicine is for injection under the skin. It is administered in a hospital or clinic by a specially trained health care professional. Talk to your pediatrician regarding the use of this medicine in children. While this drug may be prescribed for selected conditions, precautions do apply. Overdosage: If you think you have taken too much of this medicine contact a poison control center or emergency room at once. NOTE: This medicine is only for you. Do not share this medicine with others. What if I miss a dose? It is important not to miss your dose. Call your doctor or health care professional if you are unable to keep an appointment. What may interact with this medicine? -vaccines Talk to your doctor or health care professional before taking any of these medicines: -acetaminophen -aspirin -ibuprofen -ketoprofen -naproxen This list may not describe all possible interactions. Give your health care provider a list of all the medicines, herbs, non-prescription drugs, or dietary supplements you use. Also tell them if you  smoke, drink alcohol, or use illegal drugs. Some items may interact with your medicine. What should I watch for while using this medicine? Visit your doctor for checks on your progress. This drug may make you feel generally unwell. This is not uncommon, as chemotherapy can affect healthy cells as well as cancer cells. Report any side effects. Continue your course of treatment even though you feel ill unless your doctor tells you to stop. In some cases, you may be given additional medicines to help with side effects. Follow all directions for their use. Call your doctor or health care professional for advice if you get a fever, chills or sore throat, or other symptoms of a cold or flu. Do not treat yourself. This drug decreases your body's ability to fight infections. Try to avoid being around people who are sick. This medicine may increase your risk to bruise or bleed. Call your doctor or health care professional if you notice any unusual bleeding. Be careful brushing and flossing your teeth or using a toothpick because you may get an infection or bleed more easily. If you have any dental work done, tell your dentist you are receiving this medicine. Avoid taking products that contain aspirin, acetaminophen, ibuprofen, naproxen, or ketoprofen unless instructed by your doctor. These medicines may hide a fever. Do not have any vaccinations without your doctor's approval and avoid anyone who has recently had oral polio vaccine. Do not become pregnant while taking this medicine. Women should inform their doctor if they wish to become pregnant or think they might be pregnant. There is a potential for serious side effects to an unborn child. Talk to your health care professional or pharmacist for more information.  Do not breast-feed an infant while taking this medicine. If you are a man, you should not father a child while receiving treatment. What side effects may I notice from receiving this medicine? Side  effects that you should report to your doctor or health care professional as soon as possible: -allergic reactions like skin rash, itching or hives, swelling of the face, lips, or tongue -low blood counts - this medicine may decrease the number of white blood cells, red blood cells and platelets. You may be at increased risk for infections and bleeding. -signs of infection - fever or chills, cough, sore throat, pain or difficulty passing urine -signs of decreased platelets or bleeding - bruising, pinpoint red spots on the skin, black, tarry stools, blood in the urine -signs of decreased red blood cells - unusually weak or tired, fainting spells, lightheadedness -reactions at the injection site including redness, pain, itching, or bruising -breathing problems -changes in vision -fever -mouth sores -stomach pain -vomiting Side effects that usually do not require medical attention (report to your doctor or health care professional if they continue or are bothersome): -constipation -diarrhea -loss of appetite -nausea -pain or redness at the injection site -weak or tired This list may not describe all possible side effects. Call your doctor for medical advice about side effects. You may report side effects to FDA at 1-800-FDA-1088. Where should I keep my medicine? This drug is given in a hospital or clinic and will not be stored at home. NOTE: This sheet is a summary. It may not cover all possible information. If you have questions about this medicine, talk to your doctor, pharmacist, or health care provider.  2015, [employer]. (2007-03-26 11:04:07)

## 2014-07-04 NOTE — Progress Notes (Signed)
Hematology and Oncology Follow Up Visit  [name] [id number] Oct 29, 1945 69 y.o. 07/04/2014   Principle Diagnosis:   RAEB-2 (normal cytogenetics with mutated ASXL-1, TET2, U2AF1 genes)  Current Therapy:    Status post cycle 3 of Vidaza  Aranesp 300 g subcutaneous as needed for hemoglobin less than 10     Interim History:  [name] is back for follow-up. We are still transfusing him every couple weeks or so. I did start him on some Aranesp. We have to try to increase the frequency of Aranesp a little bit if possible.  He does feel tired. This might be from the chemotherapy itself.  He's had no fever.  He is still smoking. He has a couple cigarettes a day. Hopefully, he can cut back on these.  He's had no bleeding. He's had no rashes. He's had no leg swelling.   Overall, his performance status is ECOG 1.  Medications:  Current outpatient prescriptions:  .  acetaminophen (TYLENOL) 325 MG tablet, Take 325 mg by mouth every 6 (six) hours as needed for moderate pain., Disp: , Rfl:  .  aspirin EC 81 MG tablet, Take 1 tablet (81 mg total) by mouth daily., Disp: , Rfl:  .  B Complex Vitamins (VITAMIN-B COMPLEX PO), Take by mouth daily., Disp: , Rfl:  .  clindamycin (CLEOCIN) 300 MG capsule, Take 2 capsules 60 minutes before dental procedure., Disp: 2 capsule, Rfl: 0 .  LORazepam (ATIVAN) 0.5 MG tablet, Take 1 tablet (0.5 mg total) by mouth every 6 (six) hours as needed (Nausea or vomiting)., Disp: 30 tablet, Rfl: 3 .  metoprolol tartrate (LOPRESSOR) 25 MG tablet, Take 0.5 tablets (12.5 mg total) by mouth 2 (two) times daily., Disp: 90 tablet, Rfl: 3 .  nitroGLYCERIN (NITROSTAT) 0.4 MG SL tablet, Place 1 tablet (0.4 mg total) under the tongue every 5 (five) minutes as needed for chest pain., Disp: 25 tablet, Rfl: 6 .  pantoprazole (PROTONIX) 40 MG tablet, Take 40 mg by mouth daily., Disp: , Rfl:  .  predniSONE (DELTASONE) 20 MG tablet, Take 2 pills a day for 3 days, then 2 pills  a day for 3 days, then 1 pill a day for 3 days, then 1/2 pill a day for 5 days, Disp: 40 tablet, Rfl: 2 .  prochlorperazine (COMPAZINE) 10 MG tablet, Take 1 tablet (10 mg total) by mouth every 6 (six) hours as needed (Nausea or vomiting)., Disp: 30 tablet, Rfl: 1 .  simvastatin (ZOCOR) 20 MG tablet, Take 1 tablet (20 mg total) by mouth at bedtime., Disp: 30 tablet, Rfl: 5 .  temazepam (RESTORIL) 15 MG capsule, Take 1 capsule (15 mg total) by mouth at bedtime as needed for sleep., Disp: 30 capsule, Rfl: 0 No current facility-administered medications for this visit.  Facility-Administered Medications Ordered in Other Visits:  .  0.9 %  sodium chloride infusion, , Intravenous, Continuous, Volanda Napoleon, MD, Stopped at 07/04/14 1122  Allergies:  Allergies  Allergen Reactions  . Vicodin [Hydrocodone-Acetaminophen] Itching  . Cephalexin Other (See Comments)    Nausea and stomach cramps  . Phenergan [Promethazine Hcl] Other (See Comments)    "restless legs"    Past Medical History, Surgical history, Social history, and Family History were reviewed and updated.  Review of Systems: As above  Physical Exam:  weight is 191 lb (86.637 kg). His oral temperature is 98 F (36.7 C). His blood pressure is 123/67 and his pulse is 59. His respiration is 20.   Wt  Readings from Last 3 Encounters:  07/04/14 191 lb (86.637 kg)  06/10/14 193 lb (87.544 kg)  06/06/14 193 lb (87.544 kg)     Well-developed and well-nourished white gentleman in no obvious distress. Head and neck exam shows no ocular or oral lesions. There are no palpable cervical or supraclavicular lymph does. Lungs are clear. Cardiac exam regular rate and rhythm with no murmurs, rubs or bruits. Abdomen is soft. Has good bowel sounds. There is no fluid wave. There is no palpable liver or spleen tip. Back exam shows no tenderness over the spine, ribs or hips. Extremities shows no clubbing, cyanosis or edema. Skin exam shows no rashes,  ecchymoses or petechia. Neurological exam is nonfocal.  Lab Results  Component Value Date   WBC 5.1 07/04/2014   HGB 9.4* 07/04/2014   HCT 28.3* 07/04/2014   MCV 89 07/04/2014   PLT 128* 07/04/2014     Chemistry      Component Value Date/Time   NA 138 06/21/2014 1105   NA 141 06/06/2014 1109   K 4.2 06/21/2014 1105   K 4.1 06/06/2014 1109   CL 105 06/21/2014 1105   CL 106 06/06/2014 1109   CO2 24 06/21/2014 1105   CO2 26 06/06/2014 1109   BUN 17 06/21/2014 1105   BUN 23* 06/06/2014 1109   CREATININE 1.19 06/21/2014 1105   CREATININE 1.4* 06/06/2014 1109      Component Value Date/Time   CALCIUM 8.9 06/21/2014 1105   CALCIUM 9.4 06/06/2014 1109   ALKPHOS 59 06/21/2014 1105   ALKPHOS 75 06/06/2014 1109   AST 15 06/21/2014 1105   AST 25 06/06/2014 1109   ALT 19 06/21/2014 1105   ALT 29 06/06/2014 1109   BILITOT 0.6 06/21/2014 1105   BILITOT 0.90 06/06/2014 1109         Impression and Plan: [name] is 69 year old gentleman with myelodysplasia. He has normal cytogenetics. We did do the NGS of his blood we set show some abnormalities. Again, these might indicate an increased risk of transformation but it is not definitive.  Overall, I think he is responding . His blood counts are coming up nicely. I told he has wife that the anemia is oftentimes the last cell line to improve.  I think we will try to increase his Aranesp to every 2 weeks. I think this might be able to get his blood count up. If this does not work, then I could was try adding G-CSF.  We'll proceed with his fourth cycle today.  I will repeat a bone marrow test on him in about 6 weeks. We'll do this at the end of July. I'll do it at [facility].   I spent about 30 minutes with he and his wife.   Volanda Napoleon, MD 6/20/201611:33 AM

## 2014-07-05 ENCOUNTER — Ambulatory Visit (HOSPITAL_BASED_OUTPATIENT_CLINIC_OR_DEPARTMENT_OTHER): Payer: Medicare Other

## 2014-07-05 DIAGNOSIS — D46Z Other myelodysplastic syndromes: Secondary | ICD-10-CM | POA: Diagnosis not present

## 2014-07-05 DIAGNOSIS — Z5111 Encounter for antineoplastic chemotherapy: Secondary | ICD-10-CM

## 2014-07-05 DIAGNOSIS — D61818 Other pancytopenia: Secondary | ICD-10-CM

## 2014-07-05 MED ORDER — METHYLPREDNISOLONE SODIUM SUCC 125 MG IJ SOLR
125.0000 mg | Freq: Once | INTRAMUSCULAR | Status: AC
  Administered 2014-07-05: 125 mg via INTRAVENOUS

## 2014-07-05 MED ORDER — ONDANSETRON HCL 8 MG PO TABS
8.0000 mg | ORAL_TABLET | Freq: Once | ORAL | Status: AC
  Administered 2014-07-05: 8 mg via ORAL

## 2014-07-05 MED ORDER — FAMOTIDINE IN NACL 20-0.9 MG/50ML-% IV SOLN
INTRAVENOUS | Status: AC
  Filled 2014-07-05: qty 100

## 2014-07-05 MED ORDER — METHYLPREDNISOLONE SODIUM SUCC 125 MG IJ SOLR
INTRAMUSCULAR | Status: AC
  Filled 2014-07-05: qty 2

## 2014-07-05 MED ORDER — FAMOTIDINE IN NACL 20-0.9 MG/50ML-% IV SOLN
40.0000 mg | Freq: Once | INTRAVENOUS | Status: AC
  Administered 2014-07-05: 40 mg via INTRAVENOUS

## 2014-07-05 MED ORDER — AZACITIDINE CHEMO SQ INJECTION
76.0000 mg/m2 | Freq: Once | INTRAMUSCULAR | Status: AC
  Administered 2014-07-05: 160 mg via SUBCUTANEOUS
  Filled 2014-07-05: qty 6.4

## 2014-07-05 MED ORDER — ONDANSETRON HCL 8 MG PO TABS
ORAL_TABLET | ORAL | Status: AC
  Filled 2014-07-05: qty 1

## 2014-07-05 MED ORDER — DARBEPOETIN ALFA 500 MCG/ML IJ SOSY
500.0000 ug | PREFILLED_SYRINGE | Freq: Once | INTRAMUSCULAR | Status: AC
  Administered 2014-07-05: 500 ug via SUBCUTANEOUS

## 2014-07-05 MED ORDER — DARBEPOETIN ALFA 500 MCG/ML IJ SOSY
PREFILLED_SYRINGE | INTRAMUSCULAR | Status: AC
  Filled 2014-07-05: qty 1

## 2014-07-06 ENCOUNTER — Ambulatory Visit (HOSPITAL_BASED_OUTPATIENT_CLINIC_OR_DEPARTMENT_OTHER): Payer: Medicare Other

## 2014-07-06 VITALS — BP 108/64 | HR 62 | Temp 98.2°F | Resp 18

## 2014-07-06 DIAGNOSIS — Z5111 Encounter for antineoplastic chemotherapy: Secondary | ICD-10-CM | POA: Diagnosis present

## 2014-07-06 DIAGNOSIS — D61818 Other pancytopenia: Secondary | ICD-10-CM

## 2014-07-06 DIAGNOSIS — D46Z Other myelodysplastic syndromes: Secondary | ICD-10-CM

## 2014-07-06 MED ORDER — FAMOTIDINE IN NACL 20-0.9 MG/50ML-% IV SOLN
INTRAVENOUS | Status: AC
  Filled 2014-07-06: qty 50

## 2014-07-06 MED ORDER — FAMOTIDINE IN NACL 20-0.9 MG/50ML-% IV SOLN
40.0000 mg | Freq: Once | INTRAVENOUS | Status: AC
  Administered 2014-07-06: 40 mg via INTRAVENOUS

## 2014-07-06 MED ORDER — METHYLPREDNISOLONE SODIUM SUCC 125 MG IJ SOLR
INTRAMUSCULAR | Status: AC
  Filled 2014-07-06: qty 2

## 2014-07-06 MED ORDER — ONDANSETRON HCL 8 MG PO TABS
ORAL_TABLET | ORAL | Status: AC
  Filled 2014-07-06: qty 1

## 2014-07-06 MED ORDER — METHYLPREDNISOLONE SODIUM SUCC 125 MG IJ SOLR
125.0000 mg | Freq: Once | INTRAMUSCULAR | Status: AC
  Administered 2014-07-06: 125 mg via INTRAVENOUS

## 2014-07-06 MED ORDER — ONDANSETRON HCL 8 MG PO TABS
8.0000 mg | ORAL_TABLET | Freq: Once | ORAL | Status: AC
  Administered 2014-07-06: 8 mg via ORAL

## 2014-07-06 MED ORDER — AZACITIDINE CHEMO SQ INJECTION
160.0000 mg | Freq: Once | INTRAMUSCULAR | Status: AC
  Administered 2014-07-06: 160 mg via SUBCUTANEOUS
  Filled 2014-07-06: qty 6.4

## 2014-07-06 NOTE — Patient Instructions (Signed)
Azacitidine suspension for injection (subcutaneous use) What is this medicine? AZACITIDINE (ay [place]) is a chemotherapy drug. This medicine reduces the growth of cancer cells and can suppress the immune system. It is used for treating myelodysplastic syndrome or some types of leukemia. This medicine may be used for other purposes; ask your health care provider or pharmacist if you have questions. COMMON BRAND NAME(S): Vidaza What should I tell my health care provider before I take this medicine? They need to know if you have any of these conditions: -infection (especially a virus infection such as chickenpox, cold sores, or herpes) -kidney disease -liver disease -liver tumors -an unusual or allergic reaction to azacitidine, mannitol, other medicines, foods, dyes, or preservatives -pregnant or trying to get pregnant -breast-feeding How should I use this medicine? This medicine is for injection under the skin. It is administered in a hospital or clinic by a specially trained health care professional. Talk to your pediatrician regarding the use of this medicine in children. While this drug may be prescribed for selected conditions, precautions do apply. Overdosage: If you think you have taken too much of this medicine contact a poison control center or emergency room at once. NOTE: This medicine is only for you. Do not share this medicine with others. What if I miss a dose? It is important not to miss your dose. Call your doctor or health care professional if you are unable to keep an appointment. What may interact with this medicine? -vaccines Talk to your doctor or health care professional before taking any of these medicines: -acetaminophen -aspirin -ibuprofen -ketoprofen -naproxen This list may not describe all possible interactions. Give your health care provider a list of all the medicines, herbs, non-prescription drugs, or dietary supplements you use. Also tell them if you  smoke, drink alcohol, or use illegal drugs. Some items may interact with your medicine. What should I watch for while using this medicine? Visit your doctor for checks on your progress. This drug may make you feel generally unwell. This is not uncommon, as chemotherapy can affect healthy cells as well as cancer cells. Report any side effects. Continue your course of treatment even though you feel ill unless your doctor tells you to stop. In some cases, you may be given additional medicines to help with side effects. Follow all directions for their use. Call your doctor or health care professional for advice if you get a fever, chills or sore throat, or other symptoms of a cold or flu. Do not treat yourself. This drug decreases your body's ability to fight infections. Try to avoid being around people who are sick. This medicine may increase your risk to bruise or bleed. Call your doctor or health care professional if you notice any unusual bleeding. Be careful brushing and flossing your teeth or using a toothpick because you may get an infection or bleed more easily. If you have any dental work done, tell your dentist you are receiving this medicine. Avoid taking products that contain aspirin, acetaminophen, ibuprofen, naproxen, or ketoprofen unless instructed by your doctor. These medicines may hide a fever. Do not have any vaccinations without your doctor's approval and avoid anyone who has recently had oral polio vaccine. Do not become pregnant while taking this medicine. Women should inform their doctor if they wish to become pregnant or think they might be pregnant. There is a potential for serious side effects to an unborn child. Talk to your health care professional or pharmacist for more information.  Do not breast-feed an infant while taking this medicine. If you are a man, you should not father a child while receiving treatment. What side effects may I notice from receiving this medicine? Side  effects that you should report to your doctor or health care professional as soon as possible: -allergic reactions like skin rash, itching or hives, swelling of the face, lips, or tongue -low blood counts - this medicine may decrease the number of white blood cells, red blood cells and platelets. You may be at increased risk for infections and bleeding. -signs of infection - fever or chills, cough, sore throat, pain or difficulty passing urine -signs of decreased platelets or bleeding - bruising, pinpoint red spots on the skin, black, tarry stools, blood in the urine -signs of decreased red blood cells - unusually weak or tired, fainting spells, lightheadedness -reactions at the injection site including redness, pain, itching, or bruising -breathing problems -changes in vision -fever -mouth sores -stomach pain -vomiting Side effects that usually do not require medical attention (report to your doctor or health care professional if they continue or are bothersome): -constipation -diarrhea -loss of appetite -nausea -pain or redness at the injection site -weak or tired This list may not describe all possible side effects. Call your doctor for medical advice about side effects. You may report side effects to FDA at 1-800-FDA-1088. Where should I keep my medicine? This drug is given in a hospital or clinic and will not be stored at home. NOTE: This sheet is a summary. It may not cover all possible information. If you have questions about this medicine, talk to your doctor, pharmacist, or health care provider.  2015, [employer]. (2007-03-26 11:04:07)

## 2014-07-07 ENCOUNTER — Ambulatory Visit (HOSPITAL_BASED_OUTPATIENT_CLINIC_OR_DEPARTMENT_OTHER): Payer: Medicare Other

## 2014-07-07 VITALS — BP 137/70 | HR 53 | Temp 97.5°F | Resp 18

## 2014-07-07 DIAGNOSIS — D46Z Other myelodysplastic syndromes: Secondary | ICD-10-CM | POA: Diagnosis not present

## 2014-07-07 DIAGNOSIS — D61818 Other pancytopenia: Secondary | ICD-10-CM

## 2014-07-07 DIAGNOSIS — Z5111 Encounter for antineoplastic chemotherapy: Secondary | ICD-10-CM

## 2014-07-07 MED ORDER — ONDANSETRON HCL 8 MG PO TABS
8.0000 mg | ORAL_TABLET | Freq: Once | ORAL | Status: AC
Start: 2014-07-07 — End: 2014-07-07
  Administered 2014-07-07: 8 mg via ORAL

## 2014-07-07 MED ORDER — METHYLPREDNISOLONE SODIUM SUCC 125 MG IJ SOLR
INTRAMUSCULAR | Status: AC
  Filled 2014-07-07: qty 2

## 2014-07-07 MED ORDER — FAMOTIDINE IN NACL 20-0.9 MG/50ML-% IV SOLN
40.0000 mg | Freq: Once | INTRAVENOUS | Status: AC
  Administered 2014-07-07: 40 mg via INTRAVENOUS

## 2014-07-07 MED ORDER — FAMOTIDINE IN NACL 20-0.9 MG/50ML-% IV SOLN
INTRAVENOUS | Status: AC
  Filled 2014-07-07: qty 50

## 2014-07-07 MED ORDER — METHYLPREDNISOLONE SODIUM SUCC 125 MG IJ SOLR
125.0000 mg | Freq: Once | INTRAMUSCULAR | Status: AC
  Administered 2014-07-07: 125 mg via INTRAVENOUS

## 2014-07-07 MED ORDER — AZACITIDINE CHEMO SQ INJECTION
160.0000 mg | Freq: Once | INTRAMUSCULAR | Status: AC
  Administered 2014-07-07: 160 mg via SUBCUTANEOUS
  Filled 2014-07-07: qty 6.4

## 2014-07-07 MED ORDER — ONDANSETRON HCL 8 MG PO TABS
ORAL_TABLET | ORAL | Status: AC
  Filled 2014-07-07: qty 1

## 2014-07-07 NOTE — Patient Instructions (Signed)
Azacitidine suspension for injection (subcutaneous use) What is this medicine? AZACITIDINE (ay [place]) is a chemotherapy drug. This medicine reduces the growth of cancer cells and can suppress the immune system. It is used for treating myelodysplastic syndrome or some types of leukemia. This medicine may be used for other purposes; ask your health care provider or pharmacist if you have questions. COMMON BRAND NAME(S): Vidaza What should I tell my health care provider before I take this medicine? They need to know if you have any of these conditions: -infection (especially a virus infection such as chickenpox, cold sores, or herpes) -kidney disease -liver disease -liver tumors -an unusual or allergic reaction to azacitidine, mannitol, other medicines, foods, dyes, or preservatives -pregnant or trying to get pregnant -breast-feeding How should I use this medicine? This medicine is for injection under the skin. It is administered in a hospital or clinic by a specially trained health care professional. Talk to your pediatrician regarding the use of this medicine in children. While this drug may be prescribed for selected conditions, precautions do apply. Overdosage: If you think you have taken too much of this medicine contact a poison control center or emergency room at once. NOTE: This medicine is only for you. Do not share this medicine with others. What if I miss a dose? It is important not to miss your dose. Call your doctor or health care professional if you are unable to keep an appointment. What may interact with this medicine? -vaccines Talk to your doctor or health care professional before taking any of these medicines: -acetaminophen -aspirin -ibuprofen -ketoprofen -naproxen This list may not describe all possible interactions. Give your health care provider a list of all the medicines, herbs, non-prescription drugs, or dietary supplements you use. Also tell them if you  smoke, drink alcohol, or use illegal drugs. Some items may interact with your medicine. What should I watch for while using this medicine? Visit your doctor for checks on your progress. This drug may make you feel generally unwell. This is not uncommon, as chemotherapy can affect healthy cells as well as cancer cells. Report any side effects. Continue your course of treatment even though you feel ill unless your doctor tells you to stop. In some cases, you may be given additional medicines to help with side effects. Follow all directions for their use. Call your doctor or health care professional for advice if you get a fever, chills or sore throat, or other symptoms of a cold or flu. Do not treat yourself. This drug decreases your body's ability to fight infections. Try to avoid being around people who are sick. This medicine may increase your risk to bruise or bleed. Call your doctor or health care professional if you notice any unusual bleeding. Be careful brushing and flossing your teeth or using a toothpick because you may get an infection or bleed more easily. If you have any dental work done, tell your dentist you are receiving this medicine. Avoid taking products that contain aspirin, acetaminophen, ibuprofen, naproxen, or ketoprofen unless instructed by your doctor. These medicines may hide a fever. Do not have any vaccinations without your doctor's approval and avoid anyone who has recently had oral polio vaccine. Do not become pregnant while taking this medicine. Women should inform their doctor if they wish to become pregnant or think they might be pregnant. There is a potential for serious side effects to an unborn child. Talk to your health care professional or pharmacist for more information.  Do not breast-feed an infant while taking this medicine. If you are a man, you should not father a child while receiving treatment. What side effects may I notice from receiving this medicine? Side  effects that you should report to your doctor or health care professional as soon as possible: -allergic reactions like skin rash, itching or hives, swelling of the face, lips, or tongue -low blood counts - this medicine may decrease the number of white blood cells, red blood cells and platelets. You may be at increased risk for infections and bleeding. -signs of infection - fever or chills, cough, sore throat, pain or difficulty passing urine -signs of decreased platelets or bleeding - bruising, pinpoint red spots on the skin, black, tarry stools, blood in the urine -signs of decreased red blood cells - unusually weak or tired, fainting spells, lightheadedness -reactions at the injection site including redness, pain, itching, or bruising -breathing problems -changes in vision -fever -mouth sores -stomach pain -vomiting Side effects that usually do not require medical attention (report to your doctor or health care professional if they continue or are bothersome): -constipation -diarrhea -loss of appetite -nausea -pain or redness at the injection site -weak or tired This list may not describe all possible side effects. Call your doctor for medical advice about side effects. You may report side effects to FDA at 1-800-FDA-1088. Where should I keep my medicine? This drug is given in a hospital or clinic and will not be stored at home. NOTE: This sheet is a summary. It may not cover all possible information. If you have questions about this medicine, talk to your doctor, pharmacist, or health care provider.  2015, [employer]. (2007-03-26 11:04:07)

## 2014-07-08 ENCOUNTER — Ambulatory Visit (HOSPITAL_BASED_OUTPATIENT_CLINIC_OR_DEPARTMENT_OTHER): Payer: Medicare Other

## 2014-07-08 ENCOUNTER — Encounter: Payer: Self-pay | Admitting: Hematology & Oncology

## 2014-07-08 VITALS — BP 127/66 | HR 50 | Temp 98.0°F | Resp 18

## 2014-07-08 DIAGNOSIS — D46Z Other myelodysplastic syndromes: Secondary | ICD-10-CM | POA: Diagnosis not present

## 2014-07-08 DIAGNOSIS — Z5111 Encounter for antineoplastic chemotherapy: Secondary | ICD-10-CM

## 2014-07-08 DIAGNOSIS — D61818 Other pancytopenia: Secondary | ICD-10-CM

## 2014-07-08 MED ORDER — FAMOTIDINE IN NACL 20-0.9 MG/50ML-% IV SOLN
INTRAVENOUS | Status: AC
  Filled 2014-07-08: qty 100

## 2014-07-08 MED ORDER — METHYLPREDNISOLONE SODIUM SUCC 125 MG IJ SOLR
125.0000 mg | Freq: Once | INTRAMUSCULAR | Status: AC
  Administered 2014-07-08: 125 mg via INTRAVENOUS

## 2014-07-08 MED ORDER — FAMOTIDINE IN NACL 20-0.9 MG/50ML-% IV SOLN
40.0000 mg | Freq: Once | INTRAVENOUS | Status: AC
  Administered 2014-07-08: 40 mg via INTRAVENOUS

## 2014-07-08 MED ORDER — AZACITIDINE CHEMO SQ INJECTION
160.0000 mg | Freq: Once | INTRAMUSCULAR | Status: AC
  Administered 2014-07-08: 160 mg via SUBCUTANEOUS
  Filled 2014-07-08: qty 6.4

## 2014-07-08 MED ORDER — ONDANSETRON HCL 8 MG PO TABS: 8.0000 mg | ORAL_TABLET | Freq: Once | ORAL | Status: DC

## 2014-07-08 MED ORDER — METHYLPREDNISOLONE SODIUM SUCC 125 MG IJ SOLR
INTRAMUSCULAR | Status: AC
  Filled 2014-07-08: qty 2

## 2014-07-08 MED ORDER — ONDANSETRON HCL 8 MG PO TABS
ORAL_TABLET | ORAL | Status: AC
  Filled 2014-07-08: qty 1

## 2014-07-08 NOTE — Patient Instructions (Signed)
[facility] Discharge Instructions for Patients Receiving Chemotherapy  Today you received the following chemotherapy agents Vidaza  To help prevent nausea and vomiting after your treatment, we encourage you to take your nausea medication as prescribed.    If you develop nausea and vomiting that is not controlled by your nausea medication, call the clinic.   BELOW ARE SYMPTOMS THAT SHOULD BE REPORTED IMMEDIATELY:  *FEVER GREATER THAN 100.5 F  *CHILLS WITH OR WITHOUT FEVER  NAUSEA AND VOMITING THAT IS NOT CONTROLLED WITH YOUR NAUSEA MEDICATION  *UNUSUAL SHORTNESS OF BREATH  *UNUSUAL BRUISING OR BLEEDING  TENDERNESS IN MOUTH AND THROAT WITH OR WITHOUT PRESENCE OF ULCERS  *URINARY PROBLEMS  *BOWEL PROBLEMS  UNUSUAL RASH Items with * indicate a potential emergency and should be followed up as soon as possible.  Feel free to call the clinic you have any questions or concerns. The clinic phone number is [phone].  Please show the CHEMO ALERT CARD at check-in to the Emergency Department and triage nurse.   

## 2014-07-11 ENCOUNTER — Other Ambulatory Visit: Payer: Medicare Other

## 2014-07-11 ENCOUNTER — Telehealth: Payer: Self-pay | Admitting: *Deleted

## 2014-07-11 ENCOUNTER — Other Ambulatory Visit (HOSPITAL_BASED_OUTPATIENT_CLINIC_OR_DEPARTMENT_OTHER): Payer: Medicare Other

## 2014-07-11 ENCOUNTER — Other Ambulatory Visit: Payer: Self-pay

## 2014-07-11 DIAGNOSIS — D46Z Other myelodysplastic syndromes: Secondary | ICD-10-CM | POA: Diagnosis present

## 2014-07-11 LAB — CBC WITH DIFFERENTIAL (CANCER CENTER ONLY)
HCT: 26.1 % — ABNORMAL LOW (ref 38.7–49.9)
HGB: 8.9 g/dL — ABNORMAL LOW (ref 13.0–17.1)
MCH: 30.2 pg (ref 28.0–33.4)
MCHC: 34.1 g/dL (ref 32.0–35.9)
MCV: 89 fL (ref 82–98)
Platelets: 143 10*3/uL — ABNORMAL LOW (ref 145–400)
RBC: 2.95 10*6/uL — ABNORMAL LOW (ref 4.20–5.70)
RDW: 17.3 % — AB (ref 11.1–15.7)
WBC: 4.7 10*3/uL (ref 4.0–10.0)

## 2014-07-11 LAB — CMP (CANCER CENTER ONLY)
ALK PHOS: 60 U/L (ref 26–84)
ALT(SGPT): 33 U/L (ref 10–47)
AST: 22 U/L (ref 11–38)
Albumin: 3.6 g/dL (ref 3.3–5.5)
BILIRUBIN TOTAL: 1.2 mg/dL (ref 0.20–1.60)
BUN, Bld: 30 mg/dL — ABNORMAL HIGH (ref 7–22)
CO2: 27 meq/L (ref 18–33)
CREATININE: 1.4 mg/dL — AB (ref 0.6–1.2)
Calcium: 8.8 mg/dL (ref 8.0–10.3)
Chloride: 102 mEq/L (ref 98–108)
Glucose, Bld: 158 mg/dL — ABNORMAL HIGH (ref 73–118)
Potassium: 3.4 mEq/L (ref 3.3–4.7)
Sodium: 135 mEq/L (ref 128–145)
TOTAL PROTEIN: 7.3 g/dL (ref 6.4–8.1)

## 2014-07-11 LAB — MANUAL DIFFERENTIAL (CHCC SATELLITE)
ALC: 0.5 10*3/uL — AB (ref 0.9–3.3)
ANC (CHCC HP manual diff): 3.7 10*3/uL (ref 1.5–6.5)
BAND NEUTROPHILS: 5 % (ref 0–10)
LYMPH: 11 % — AB (ref 14–48)
MONO: 4 % (ref 0–13)
PLT EST ~~LOC~~: DECREASED
SEG: 75 % (ref 40–75)

## 2014-07-11 LAB — RETICULOCYTES (CHCC)
ABS Retic: 15.7 10*3/uL — ABNORMAL LOW (ref 19.0–186.0)
RBC.: 3.13 MIL/uL — AB (ref 4.22–5.81)
RETIC CT PCT: 0.5 % (ref 0.4–2.3)

## 2014-07-11 LAB — CHCC SATELLITE - SMEAR

## 2014-07-11 NOTE — Telephone Encounter (Signed)
Called to address patient email regarding need for appointment change. Wife stated that someone else had already called and addressed their needs.

## 2014-07-12 ENCOUNTER — Other Ambulatory Visit: Payer: Medicare Other

## 2014-07-19 ENCOUNTER — Other Ambulatory Visit (HOSPITAL_BASED_OUTPATIENT_CLINIC_OR_DEPARTMENT_OTHER): Payer: Medicare Other

## 2014-07-19 ENCOUNTER — Ambulatory Visit (HOSPITAL_BASED_OUTPATIENT_CLINIC_OR_DEPARTMENT_OTHER): Payer: Medicare Other

## 2014-07-19 DIAGNOSIS — D649 Anemia, unspecified: Secondary | ICD-10-CM | POA: Diagnosis not present

## 2014-07-19 DIAGNOSIS — D46Z Other myelodysplastic syndromes: Secondary | ICD-10-CM

## 2014-07-19 DIAGNOSIS — D4620 Refractory anemia with excess of blasts, unspecified: Secondary | ICD-10-CM

## 2014-07-19 LAB — MANUAL DIFFERENTIAL (CHCC SATELLITE)
ALC: 1 10*3/uL (ref 0.9–3.3)
ANC (CHCC HP manual diff): 1.4 10*3/uL — ABNORMAL LOW (ref 1.5–6.5)
Band Neutrophils: 1 % (ref 0–10)
LYMPH: 37 % (ref 14–48)
METAMYELOCYTES PCT: 1 % — AB (ref 0–0)
MONO: 12 % (ref 0–13)
Myelocytes: 1 % — ABNORMAL HIGH (ref 0–0)
PLATELET MORPHOLOGY: NORMAL
PLT EST ~~LOC~~: DECREASED
SEG: 48 % (ref 40–75)

## 2014-07-19 LAB — CBC WITH DIFFERENTIAL (CANCER CENTER ONLY)
HEMATOCRIT: 22.2 % — AB (ref 38.7–49.9)
HGB: 7.2 g/dL — ABNORMAL LOW (ref 13.0–17.1)
MCH: 28.9 pg (ref 28.0–33.4)
MCHC: 32.4 g/dL (ref 32.0–35.9)
MCV: 89 fL (ref 82–98)
Platelets: 93 10*3/uL — ABNORMAL LOW (ref 145–400)
RBC: 2.49 10*6/uL — ABNORMAL LOW (ref 4.20–5.70)
RDW: 17.1 % — ABNORMAL HIGH (ref 11.1–15.7)
WBC: 2.8 10*3/uL — AB (ref 4.0–10.0)

## 2014-07-19 MED ORDER — DARBEPOETIN ALFA 500 MCG/ML IJ SOSY
500.0000 ug | PREFILLED_SYRINGE | Freq: Once | INTRAMUSCULAR | Status: AC
  Administered 2014-07-19: 500 ug via SUBCUTANEOUS

## 2014-07-19 MED ORDER — DARBEPOETIN ALFA 500 MCG/ML IJ SOSY
PREFILLED_SYRINGE | INTRAMUSCULAR | Status: AC
  Filled 2014-07-19: qty 1

## 2014-07-19 NOTE — Patient Instructions (Signed)
Darbepoetin Alfa injection What is this medicine? DARBEPOETIN ALFA (dar be POE e tin AL fa) helps your body make more red blood cells. It is used to treat anemia caused by chronic kidney failure and chemotherapy. This medicine may be used for other purposes; ask your health care provider or pharmacist if you have questions. COMMON BRAND NAME(S): Aranesp What should I tell my health care provider before I take this medicine? They need to know if you have any of these conditions: -blood clotting disorders or history of blood clots -cancer patient not on chemotherapy -cystic fibrosis -heart disease, such as angina, heart failure, or a history of a heart attack -hemoglobin level of 12 g/dL or greater -high blood pressure -low levels of folate, iron, or vitamin B12 -seizures -an unusual or allergic reaction to darbepoetin, erythropoietin, albumin, hamster proteins, latex, other medicines, foods, dyes, or preservatives -pregnant or trying to get pregnant -breast-feeding How should I use this medicine? This medicine is for injection into a vein or under the skin. It is usually given by a health care professional in a hospital or clinic setting. If you get this medicine at home, you will be taught how to prepare and give this medicine. Do not shake the solution before you withdraw a dose. Use exactly as directed. Take your medicine at regular intervals. Do not take your medicine more often than directed. It is important that you put your used needles and syringes in a special sharps container. Do not put them in a trash can. If you do not have a sharps container, call your pharmacist or healthcare provider to get one. Talk to your pediatrician regarding the use of this medicine in children. While this medicine may be used in children as young as 1 year for selected conditions, precautions do apply. Overdosage: If you think you have taken too much of this medicine contact a poison control center or  emergency room at once. NOTE: This medicine is only for you. Do not share this medicine with others. What if I miss a dose? If you miss a dose, take it as soon as you can. If it is almost time for your next dose, take only that dose. Do not take double or extra doses. What may interact with this medicine? Do not take this medicine with any of the following medications: -epoetin alfa This list may not describe all possible interactions. Give your health care provider a list of all the medicines, herbs, non-prescription drugs, or dietary supplements you use. Also tell them if you smoke, drink alcohol, or use illegal drugs. Some items may interact with your medicine. What should I watch for while using this medicine? Visit your prescriber or health care professional for regular checks on your progress and for the needed blood tests and blood pressure measurements. It is especially important for the doctor to make sure your hemoglobin level is in the desired range, to limit the risk of potential side effects and to give you the best benefit. Keep all appointments for any recommended tests. Check your blood pressure as directed. Ask your doctor what your blood pressure should be and when you should contact him or her. As your body makes more red blood cells, you may need to take iron, folic acid, or vitamin B supplements. Ask your doctor or health care provider which products are right for you. If you have kidney disease continue dietary restrictions, even though this medication can make you feel better. Talk with your doctor or health   care professional about the foods you eat and the vitamins that you take. What side effects may I notice from receiving this medicine? Side effects that you should report to your doctor or health care professional as soon as possible: -allergic reactions like skin rash, itching or hives, swelling of the face, lips, or tongue -breathing problems -changes in vision -chest  pain -confusion, trouble speaking or understanding -feeling faint or lightheaded, falls -high blood pressure -muscle aches or pains -pain, swelling, warmth in the leg -rapid weight gain -severe headaches -sudden numbness or weakness of the face, arm or leg -trouble walking, dizziness, loss of balance or coordination -seizures (convulsions) -swelling of the ankles, feet, hands -unusually weak or tired Side effects that usually do not require medical attention (report to your doctor or health care professional if they continue or are bothersome): -diarrhea -fever, chills (flu-like symptoms) -headaches -nausea, vomiting -redness, stinging, or swelling at site where injected This list may not describe all possible side effects. Call your doctor for medical advice about side effects. You may report side effects to FDA at 1-800-FDA-1088. Where should I keep my medicine? Keep out of the reach of children. Store in a refrigerator between 2 and 8 degrees C (36 and 46 degrees F). Do not freeze. Do not shake. Throw away any unused portion if using a single-dose vial. Throw away any unused medicine after the expiration date. NOTE: This sheet is a summary. It may not cover all possible information. If you have questions about this medicine, talk to your doctor, pharmacist, or health care provider.  2015, [employer]. (2007-12-15 10:23:57)  

## 2014-07-25 ENCOUNTER — Telehealth: Payer: Self-pay | Admitting: *Deleted

## 2014-07-25 ENCOUNTER — Other Ambulatory Visit (HOSPITAL_BASED_OUTPATIENT_CLINIC_OR_DEPARTMENT_OTHER): Payer: Medicare Other

## 2014-07-25 DIAGNOSIS — D46Z Other myelodysplastic syndromes: Secondary | ICD-10-CM

## 2014-07-25 LAB — HOLD TUBE, BLOOD BANK - CHCC SATELLITE

## 2014-07-25 LAB — CBC WITH DIFFERENTIAL (CANCER CENTER ONLY)
HCT: 21.6 % — ABNORMAL LOW (ref 38.7–49.9)
HGB: 7 g/dL — ABNORMAL LOW (ref 13.0–17.1)
MCH: 29 pg (ref 28.0–33.4)
MCHC: 32.4 g/dL (ref 32.0–35.9)
MCV: 90 fL (ref 82–98)
Platelets: 88 10*3/uL — ABNORMAL LOW (ref 145–400)
RBC: 2.41 10*6/uL — AB (ref 4.20–5.70)
RDW: 17 % — ABNORMAL HIGH (ref 11.1–15.7)
WBC: 3.6 10*3/uL — ABNORMAL LOW (ref 4.0–10.0)

## 2014-07-25 LAB — MANUAL DIFFERENTIAL (CHCC SATELLITE)
ALC: 1 10*3/uL (ref 0.9–3.3)
ANC (CHCC HP manual diff): 2.3 10*3/uL (ref 1.5–6.5)
BAND NEUTROPHILS: 3 % (ref 0–10)
LYMPH: 29 % (ref 14–48)
MONO: 6 % (ref 0–13)
Metamyelocytes: 2 % — ABNORMAL HIGH (ref 0–0)
PLT EST ~~LOC~~: DECREASED
SEG: 60 % (ref 40–75)

## 2014-07-25 NOTE — Telephone Encounter (Signed)
Patient Hgb 7.0  Patient wants to go to the beach to be with his family.  Patient feeling ok and will call us if he needs [place].  Ok to not be transfused per dr. Marin Olp.

## 2014-08-01 ENCOUNTER — Ambulatory Visit (HOSPITAL_COMMUNITY)
Admission: RE | Admit: 2014-08-01 | Discharge: 2014-08-01 | Disposition: A | Discharge status: Home / Self Care | Payer: Medicare Other | Source: Ambulatory Visit | Attending: Hematology & Oncology | Admitting: Hematology & Oncology

## 2014-08-01 ENCOUNTER — Ambulatory Visit (HOSPITAL_BASED_OUTPATIENT_CLINIC_OR_DEPARTMENT_OTHER): Payer: Medicare Other

## 2014-08-01 ENCOUNTER — Other Ambulatory Visit (HOSPITAL_BASED_OUTPATIENT_CLINIC_OR_DEPARTMENT_OTHER): Payer: Medicare Other

## 2014-08-01 ENCOUNTER — Other Ambulatory Visit: Payer: Self-pay

## 2014-08-01 VITALS — BP 133/63 | HR 56 | Temp 98.2°F | Resp 16

## 2014-08-01 DIAGNOSIS — D6481 Anemia due to antineoplastic chemotherapy: Secondary | ICD-10-CM

## 2014-08-01 DIAGNOSIS — D4622 Refractory anemia with excess of blasts 2: Secondary | ICD-10-CM

## 2014-08-01 DIAGNOSIS — T451X5A Adverse effect of antineoplastic and immunosuppressive drugs, initial encounter: Principal | ICD-10-CM

## 2014-08-01 DIAGNOSIS — D4620 Refractory anemia with excess of blasts, unspecified: Secondary | ICD-10-CM

## 2014-08-01 DIAGNOSIS — D46Z Other myelodysplastic syndromes: Secondary | ICD-10-CM

## 2014-08-01 LAB — MANUAL DIFFERENTIAL (CHCC SATELLITE)
ALC: 1 10*3/uL (ref 0.9–3.3)
ANC (CHCC HP manual diff): 3.7 10*3/uL (ref 1.5–6.5)
BAND NEUTROPHILS: 1 % (ref 0–10)
EOS: 1 % (ref 0–7)
LYMPH: 20 % (ref 14–48)
METAMYELOCYTES PCT: 2 % — AB (ref 0–0)
MONO: 7 % (ref 0–13)
PLATELET MORPHOLOGY: NORMAL
PLT EST ~~LOC~~: DECREASED
SEG: 69 % (ref 40–75)

## 2014-08-01 LAB — CBC WITH DIFFERENTIAL (CANCER CENTER ONLY)
HCT: 20.6 % — ABNORMAL LOW (ref 38.7–49.9)
HGB: 6.7 g/dL — CL (ref 13.0–17.1)
MCH: 29.1 pg (ref 28.0–33.4)
MCHC: 32.5 g/dL (ref 32.0–35.9)
MCV: 90 fL (ref 82–98)
PLATELETS: 107 10*3/uL — AB (ref 145–400)
RBC: 2.3 10*6/uL — ABNORMAL LOW (ref 4.20–5.70)
RDW: 17.8 % — ABNORMAL HIGH (ref 11.1–15.7)
WBC: 5.1 10*3/uL (ref 4.0–10.0)

## 2014-08-01 LAB — PREPARE RBC (CROSSMATCH)

## 2014-08-01 MED ORDER — DARBEPOETIN ALFA 500 MCG/ML IJ SOSY
PREFILLED_SYRINGE | INTRAMUSCULAR | Status: AC
  Filled 2014-08-01: qty 1

## 2014-08-01 MED ORDER — DARBEPOETIN ALFA 500 MCG/ML IJ SOSY
500.0000 ug | PREFILLED_SYRINGE | Freq: Once | INTRAMUSCULAR | Status: AC
  Administered 2014-08-01: 500 ug via SUBCUTANEOUS

## 2014-08-01 NOTE — Progress Notes (Signed)
Offered pt blood transfusion tomorrow at 8am. Pt reports "I feel good." Wife at chairside encouraging pt to have transfusion. Pt wants to "think about it." Only mild drop since last CBC. Pt to contact our office by 3pm today if he wants blood. dph

## 2014-08-01 NOTE — Patient Instructions (Signed)
Darbepoetin Alfa injection What is this medicine? DARBEPOETIN ALFA (dar be POE e tin AL fa) helps your body make more red blood cells. It is used to treat anemia caused by chronic kidney failure and chemotherapy. This medicine may be used for other purposes; ask your health care provider or pharmacist if you have questions. COMMON BRAND NAME(S): Aranesp What should I tell my health care provider before I take this medicine? They need to know if you have any of these conditions: -blood clotting disorders or history of blood clots -cancer patient not on chemotherapy -cystic fibrosis -heart disease, such as angina, heart failure, or a history of a heart attack -hemoglobin level of 12 g/dL or greater -high blood pressure -low levels of folate, iron, or vitamin B12 -seizures -an unusual or allergic reaction to darbepoetin, erythropoietin, albumin, hamster proteins, latex, other medicines, foods, dyes, or preservatives -pregnant or trying to get pregnant -breast-feeding How should I use this medicine? This medicine is for injection into a vein or under the skin. It is usually given by a health care professional in a hospital or clinic setting. If you get this medicine at home, you will be taught how to prepare and give this medicine. Do not shake the solution before you withdraw a dose. Use exactly as directed. Take your medicine at regular intervals. Do not take your medicine more often than directed. It is important that you put your used needles and syringes in a special sharps container. Do not put them in a trash can. If you do not have a sharps container, call your pharmacist or healthcare provider to get one. Talk to your pediatrician regarding the use of this medicine in children. While this medicine may be used in children as young as 1 year for selected conditions, precautions do apply. Overdosage: If you think you have taken too much of this medicine contact a poison control center or  emergency room at once. NOTE: This medicine is only for you. Do not share this medicine with others. What if I miss a dose? If you miss a dose, take it as soon as you can. If it is almost time for your next dose, take only that dose. Do not take double or extra doses. What may interact with this medicine? Do not take this medicine with any of the following medications: -epoetin alfa This list may not describe all possible interactions. Give your health care provider a list of all the medicines, herbs, non-prescription drugs, or dietary supplements you use. Also tell them if you smoke, drink alcohol, or use illegal drugs. Some items may interact with your medicine. What should I watch for while using this medicine? Visit your prescriber or health care professional for regular checks on your progress and for the needed blood tests and blood pressure measurements. It is especially important for the doctor to make sure your hemoglobin level is in the desired range, to limit the risk of potential side effects and to give you the best benefit. Keep all appointments for any recommended tests. Check your blood pressure as directed. Ask your doctor what your blood pressure should be and when you should contact him or her. As your body makes more red blood cells, you may need to take iron, folic acid, or vitamin B supplements. Ask your doctor or health care provider which products are right for you. If you have kidney disease continue dietary restrictions, even though this medication can make you feel better. Talk with your doctor or health   care professional about the foods you eat and the vitamins that you take. What side effects may I notice from receiving this medicine? Side effects that you should report to your doctor or health care professional as soon as possible: -allergic reactions like skin rash, itching or hives, swelling of the face, lips, or tongue -breathing problems -changes in vision -chest  pain -confusion, trouble speaking or understanding -feeling faint or lightheaded, falls -high blood pressure -muscle aches or pains -pain, swelling, warmth in the leg -rapid weight gain -severe headaches -sudden numbness or weakness of the face, arm or leg -trouble walking, dizziness, loss of balance or coordination -seizures (convulsions) -swelling of the ankles, feet, hands -unusually weak or tired Side effects that usually do not require medical attention (report to your doctor or health care professional if they continue or are bothersome): -diarrhea -fever, chills (flu-like symptoms) -headaches -nausea, vomiting -redness, stinging, or swelling at site where injected This list may not describe all possible side effects. Call your doctor for medical advice about side effects. You may report side effects to FDA at 1-800-FDA-1088. Where should I keep my medicine? Keep out of the reach of children. Store in a refrigerator between 2 and 8 degrees C (36 and 46 degrees F). Do not freeze. Do not shake. Throw away any unused portion if using a single-dose vial. Throw away any unused medicine after the expiration date. NOTE: This sheet is a summary. It may not cover all possible information. If you have questions about this medicine, talk to your doctor, pharmacist, or health care provider.  2015, [employer]. (2007-12-15 10:23:57)  

## 2014-08-02 ENCOUNTER — Ambulatory Visit (HOSPITAL_BASED_OUTPATIENT_CLINIC_OR_DEPARTMENT_OTHER): Payer: Medicare Other

## 2014-08-02 VITALS — BP 113/63 | HR 62 | Temp 98.0°F | Resp 18

## 2014-08-02 DIAGNOSIS — D6481 Anemia due to antineoplastic chemotherapy: Secondary | ICD-10-CM | POA: Diagnosis not present

## 2014-08-02 DIAGNOSIS — D61818 Other pancytopenia: Secondary | ICD-10-CM

## 2014-08-02 DIAGNOSIS — D4622 Refractory anemia with excess of blasts 2: Secondary | ICD-10-CM | POA: Diagnosis present

## 2014-08-02 DIAGNOSIS — T451X5A Adverse effect of antineoplastic and immunosuppressive drugs, initial encounter: Secondary | ICD-10-CM

## 2014-08-02 LAB — HOLD TUBE, BLOOD BANK - CHCC SATELLITE

## 2014-08-02 MED ORDER — SODIUM CHLORIDE 0.9 % IV SOLN
250.0000 mL | Freq: Once | INTRAVENOUS | Status: AC
  Administered 2014-08-02: 250 mL via INTRAVENOUS

## 2014-08-02 MED ORDER — FUROSEMIDE 10 MG/ML IJ SOLN
20.0000 mg | Freq: Once | INTRAMUSCULAR | Status: AC
  Administered 2014-08-02: 20 mg via INTRAVENOUS

## 2014-08-02 MED ORDER — ACETAMINOPHEN 325 MG PO TABS
ORAL_TABLET | ORAL | Status: AC
  Filled 2014-08-02: qty 2

## 2014-08-02 MED ORDER — FUROSEMIDE 10 MG/ML IJ SOLN
INTRAMUSCULAR | Status: AC
  Filled 2014-08-02: qty 4

## 2014-08-02 MED ORDER — ACETAMINOPHEN 325 MG PO TABS
650.0000 mg | ORAL_TABLET | Freq: Once | ORAL | Status: AC
  Administered 2014-08-02: 650 mg via ORAL

## 2014-08-02 MED ORDER — SODIUM CHLORIDE 0.9 % IJ SOLN
10.0000 mL | INTRAMUSCULAR | Status: DC | PRN
  Filled 2014-08-02: qty 10

## 2014-08-02 MED ORDER — DIPHENHYDRAMINE HCL 25 MG PO CAPS
ORAL_CAPSULE | ORAL | Status: AC
  Filled 2014-08-02: qty 1

## 2014-08-02 MED ORDER — DIPHENHYDRAMINE HCL 25 MG PO CAPS
25.0000 mg | ORAL_CAPSULE | Freq: Once | ORAL | Status: AC
  Administered 2014-08-02: 25 mg via ORAL

## 2014-08-02 NOTE — Patient Instructions (Signed)

## 2014-08-03 LAB — TYPE AND SCREEN
ABO/RH(D): A POS
ANTIBODY SCREEN: NEGATIVE
DONOR AG TYPE: NEGATIVE
DONOR AG TYPE: NEGATIVE
UNIT DIVISION: 0
Unit division: 0

## 2014-08-03 NOTE — Patient Outreach (Signed)
[place] [facility]) Care Management  08/03/2014  [name] [birth year] [id number]   Referral from [place] [facility], assigned Quinn Plowman, RN to outreach.  [name]. [place], [facility] [place] Assistant Phone: [phone] Fax: [phone]

## 2014-08-08 ENCOUNTER — Other Ambulatory Visit: Payer: Self-pay | Admitting: Hematology & Oncology

## 2014-08-08 ENCOUNTER — Encounter: Payer: Self-pay | Admitting: Hematology & Oncology

## 2014-08-08 ENCOUNTER — Other Ambulatory Visit (HOSPITAL_BASED_OUTPATIENT_CLINIC_OR_DEPARTMENT_OTHER): Payer: Medicare Other

## 2014-08-08 ENCOUNTER — Ambulatory Visit: Payer: Medicare Other

## 2014-08-08 DIAGNOSIS — D46Z Other myelodysplastic syndromes: Secondary | ICD-10-CM

## 2014-08-08 DIAGNOSIS — D4620 Refractory anemia with excess of blasts, unspecified: Secondary | ICD-10-CM

## 2014-08-08 LAB — CBC WITH DIFFERENTIAL (CANCER CENTER ONLY)
HCT: 24.9 % — ABNORMAL LOW (ref 38.7–49.9)
HGB: 8.2 g/dL — ABNORMAL LOW (ref 13.0–17.1)
MCH: 29.8 pg (ref 28.0–33.4)
MCHC: 32.9 g/dL (ref 32.0–35.9)
MCV: 91 fL (ref 82–98)
Platelets: 80 10*3/uL — ABNORMAL LOW (ref 145–400)
RBC: 2.75 10*6/uL — ABNORMAL LOW (ref 4.20–5.70)
RDW: 17 % — ABNORMAL HIGH (ref 11.1–15.7)
WBC: 4.4 10*3/uL (ref 4.0–10.0)

## 2014-08-08 LAB — MANUAL DIFFERENTIAL (CHCC SATELLITE)
ALC: 0.9 10*3/uL (ref 0.9–3.3)
ANC (CHCC HP manual diff): 3 10*3/uL (ref 1.5–6.5)
LYMPH: 21 % (ref 14–48)
MONO: 10 % (ref 0–13)
PLT EST ~~LOC~~: DECREASED
SEG: 69 % (ref 40–75)

## 2014-08-09 ENCOUNTER — Encounter (HOSPITAL_COMMUNITY): Payer: Self-pay

## 2014-08-09 ENCOUNTER — Ambulatory Visit (HOSPITAL_COMMUNITY)
Admission: RE | Admit: 2014-08-09 | Discharge: 2014-08-09 | Disposition: A | Discharge status: Home / Self Care | Payer: Medicare Other | Source: Ambulatory Visit | Attending: Hematology & Oncology | Admitting: Hematology & Oncology

## 2014-08-09 VITALS — BP 104/67 | HR 56 | Temp 98.0°F | Resp 16 | Ht 70.0 in | Wt 185.0 lb

## 2014-08-09 DIAGNOSIS — D4620 Refractory anemia with excess of blasts, unspecified: Secondary | ICD-10-CM

## 2014-08-09 DIAGNOSIS — D61818 Other pancytopenia: Secondary | ICD-10-CM | POA: Insufficient documentation

## 2014-08-09 DIAGNOSIS — D4622 Refractory anemia with excess of blasts 2: Secondary | ICD-10-CM | POA: Diagnosis present

## 2014-08-09 LAB — CBC WITH DIFFERENTIAL/PLATELET
Basophils Absolute: 0 10*3/uL (ref 0.0–0.1)
Basophils Relative: 0 % (ref 0–1)
EOS ABS: 0 10*3/uL (ref 0.0–0.7)
Eosinophils Relative: 0 % (ref 0–5)
HCT: 24.2 % — ABNORMAL LOW (ref 39.0–52.0)
Hemoglobin: 7.8 g/dL — ABNORMAL LOW (ref 13.0–17.0)
Lymphocytes Relative: 29 % (ref 12–46)
Lymphs Abs: 1 10*3/uL (ref 0.7–4.0)
MCH: 28.2 pg (ref 26.0–34.0)
MCHC: 32.2 g/dL (ref 30.0–36.0)
MCV: 87.4 fL (ref 78.0–100.0)
Monocytes Absolute: 0.4 10*3/uL (ref 0.1–1.0)
Monocytes Relative: 11 % (ref 3–12)
NEUTROS ABS: 2 10*3/uL (ref 1.7–7.7)
Neutrophils Relative %: 60 % (ref 43–77)
Platelets: 77 10*3/uL — ABNORMAL LOW (ref 150–400)
RBC: 2.77 MIL/uL — ABNORMAL LOW (ref 4.22–5.81)
RDW: 17 % — ABNORMAL HIGH (ref 11.5–15.5)
WBC: 3.4 10*3/uL — ABNORMAL LOW (ref 4.0–10.5)

## 2014-08-09 LAB — BONE MARROW EXAM

## 2014-08-09 MED ORDER — SODIUM CHLORIDE 0.9 % IV SOLN
INTRAVENOUS | Status: DC
  Administered 2014-08-09: 07:00:00 via INTRAVENOUS

## 2014-08-09 MED ORDER — MIDAZOLAM HCL 5 MG/5ML IJ SOLN
INTRAMUSCULAR | Status: AC | PRN
  Administered 2014-08-09: 1 mg via INTRAVENOUS
  Administered 2014-08-09: 5 mg via INTRAVENOUS

## 2014-08-09 MED ORDER — MEPERIDINE HCL 50 MG/ML IJ SOLN
50.0000 mg | Freq: Once | INTRAMUSCULAR | Status: DC
  Filled 2014-08-09: qty 1

## 2014-08-09 MED ORDER — MEPERIDINE HCL 25 MG/ML IJ SOLN
INTRAMUSCULAR | Status: AC | PRN
Start: 2014-08-09 — End: 2014-08-09
  Administered 2014-08-09: 50 mg via INTRAVENOUS

## 2014-08-09 MED ORDER — MIDAZOLAM HCL 5 MG/ML IJ SOLN
10.0000 mg | Freq: Once | INTRAMUSCULAR | Status: DC
  Filled 2014-08-09: qty 2

## 2014-08-09 NOTE — Discharge Instructions (Signed)
Bone Marrow Aspiration, Bone Marrow Biopsy °Care After °Read the instructions outlined below and refer to this sheet in the next few weeks. These discharge instructions provide you with general information on caring for yourself after you leave the hospital. Your caregiver may also give you specific instructions. While your treatment has been planned according to the most current medical practices available, unavoidable complications occasionally occur. If you have any problems or questions after discharge, call your caregiver. °FINDING OUT THE RESULTS OF YOUR TEST °Not all test results are available during your visit. If your test results are not back during the visit, make an appointment with your caregiver to find out the results. Do not assume everything is normal if you have not heard from your caregiver or the medical facility. It is important for you to follow up on all of your test results.  °HOME CARE INSTRUCTIONS  °You have had sedation and may be sleepy or dizzy. Your thinking may not be as clear as usual. For the next 24 hours: °· Only take over-the-counter or prescription medicines for pain, discomfort, and or fever as directed by your caregiver. °· Do not drink alcohol. °· Do not smoke. °· Do not drive. °· Do not make important legal decisions. °· Do not operate heavy machinery. °· Do not care for small children by yourself. °· Keep your dressing clean and dry. You may replace dressing with a bandage after 24 hours. °· You may take a bath or shower after 24 hours. °· Use an ice pack for 20 minutes every 2 hours while awake for pain as needed. °SEEK MEDICAL CARE IF:  °· There is redness, swelling, or increasing pain at the biopsy site. °· There is pus coming from the biopsy site. °· There is drainage from a biopsy site lasting longer than one day. °· An unexplained oral temperature above 102° F (38.9° C) develops. °SEEK IMMEDIATE MEDICAL CARE IF:  °· You develop a rash. °· You have difficulty  breathing. °· You develop any reaction or side effects to medications given. °Document Released: 07/20/2004 Document Revised: 03/25/2011 Document Reviewed: 12/29/2007 °ExitCare® Patient Information ©2015 [employer]. This information is not intended to replace advice given to you by your health care provider. Make sure you discuss any questions you have with your health care provider. °Conscious Sedation, Adult, Care After °Refer to this sheet in the next few weeks. These instructions provide you with information on caring for yourself after your procedure. Your health care provider may also give you more specific instructions. Your treatment has been planned according to current medical practices, but problems sometimes occur. Call your health care provider if you have any problems or questions after your procedure. °WHAT TO EXPECT AFTER THE PROCEDURE  °After your procedure: °· You may feel sleepy, clumsy, and have poor balance for several hours. °· Vomiting may occur if you eat too soon after the procedure. °HOME CARE INSTRUCTIONS °· Do not participate in any activities where you could become injured for at least 24 hours. Do not: °¨ Drive. °¨ Swim. °¨ Ride a bicycle. °¨ Operate heavy machinery. °¨ Cook. °¨ Use power tools. °¨ Climb ladders. °¨ Work from a high place. °· Do not make important decisions or sign legal documents until you are improved. °· If you vomit, drink water, juice, or soup when you can drink without vomiting. Make sure you have little or no nausea before eating solid foods. °· Only take over-the-counter or prescription medicines for pain, discomfort, or fever   as directed by your health care provider.  Make sure you and your family fully understand everything about the medicines given to you, including what side effects may occur.  You should not drink alcohol, take sleeping pills, or take medicines that cause drowsiness for at least 24 hours.  If you smoke, do not smoke without  supervision.  If you are feeling better, you may resume normal activities 24 hours after you were sedated.  Keep all appointments with your health care provider. SEEK MEDICAL CARE IF:  Your skin is pale or bluish in color.  You continue to feel nauseous or vomit.  Your pain is getting worse and is not helped by medicine.  You have bleeding or swelling.  You are still sleepy or feeling clumsy after 24 hours. SEEK IMMEDIATE MEDICAL CARE IF:  You develop a rash.  You have difficulty breathing.  You develop any type of allergic problem.  You have a fever. MAKE SURE YOU:  Understand these instructions.  Will watch your condition.  Will get help right away if you are not doing well or get worse. Document Released: 10/21/2012 Document Reviewed: 10/21/2012 [facility] Patient Information 2015 [place], [place]. This information is not intended to replace advice given to you by your health care provider. Make sure you discuss any questions you have with your health care provider.

## 2014-08-09 NOTE — Procedures (Signed)
Mr. Causby was brought to the [place] short stay unit. We were in room 13.  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  We did the informed consent. We had this signed.  We then did the appropriate timeout procedure. This was done at 7:50 AM.  His Mallimpati score is 1. His ASA class is 1.  He had an IV placed without difficulty.  He was then placed onto his right side. He received a total of 6 mg of Versed and 50 mg Demerol for IV sedation.  The left posterior iliac crest region was prepped and draped in sterile fashion. 5 mL of 1% lidocaine was treated under the skin down to the periosteum.  Reason scalpel to make an incision into the skin. We then used a bone marrow aspirate needle and obtained to 2 bone marrow aspirates.  I then used the biopsy needle and obtained an excellent biopsy core.  He tolerated the procedure well. There were no consultations.  I cleaned and dressed the procedure site sterilely.  I went and got his wife. I talked to her for a few minutes.  As always, I appreciate the incredible help from the [facility] staff!!  [name]

## 2014-08-15 ENCOUNTER — Encounter: Payer: Self-pay | Admitting: *Deleted

## 2014-08-15 ENCOUNTER — Other Ambulatory Visit (HOSPITAL_BASED_OUTPATIENT_CLINIC_OR_DEPARTMENT_OTHER): Payer: Medicare Other

## 2014-08-15 ENCOUNTER — Encounter: Payer: Self-pay | Admitting: Hematology & Oncology

## 2014-08-15 ENCOUNTER — Ambulatory Visit (HOSPITAL_BASED_OUTPATIENT_CLINIC_OR_DEPARTMENT_OTHER): Payer: Medicare Other | Admitting: Hematology & Oncology

## 2014-08-15 ENCOUNTER — Ambulatory Visit: Payer: Medicare Other

## 2014-08-15 VITALS — BP 142/66 | HR 61 | Temp 98.2°F | Resp 18 | Ht 70.0 in | Wt 194.0 lb

## 2014-08-15 DIAGNOSIS — D4622 Refractory anemia with excess of blasts 2: Secondary | ICD-10-CM

## 2014-08-15 DIAGNOSIS — D46Z Other myelodysplastic syndromes: Secondary | ICD-10-CM

## 2014-08-15 LAB — CBC WITH DIFFERENTIAL (CANCER CENTER ONLY)
HCT: 22.3 % — ABNORMAL LOW (ref 38.7–49.9)
HEMOGLOBIN: 7.4 g/dL — AB (ref 13.0–17.1)
MCH: 30.1 pg (ref 28.0–33.4)
MCHC: 33.2 g/dL (ref 32.0–35.9)
MCV: 91 fL (ref 82–98)
Platelets: 87 10*3/uL — ABNORMAL LOW (ref 145–400)
RBC: 2.46 10*6/uL — ABNORMAL LOW (ref 4.20–5.70)
RDW: 16.5 % — ABNORMAL HIGH (ref 11.1–15.7)
WBC: 4.1 10*3/uL (ref 4.0–10.0)

## 2014-08-15 LAB — MANUAL DIFFERENTIAL (CHCC SATELLITE)
ALC: 1 10*3/uL (ref 0.9–3.3)
ANC (CHCC HP manual diff): 2.4 10*3/uL (ref 1.5–6.5)
Band Neutrophils: 2 % (ref 0–10)
LYMPH: 25 % (ref 14–48)
MONO: 16 % — ABNORMAL HIGH (ref 0–13)
Myelocytes: 1 % — ABNORMAL HIGH (ref 0–0)
PLT EST ~~LOC~~: DECREASED
Platelet Morphology: NORMAL
SEG: 56 % (ref 40–75)

## 2014-08-15 NOTE — Progress Notes (Signed)
Hematology and Oncology Follow Up Visit  [name] [id number] 04-Jun-1945 69 y.o. 08/15/2014   Principle Diagnosis:   RAEB-2 (normal cytogenetics with mutated ASXL-1, TET2, U2AF1 genes)  Current Therapy:    Status post cycle 4 of Vidaza  Aranesp 300 g subcutaneous as needed for hemoglobin less than 10     Interim History:  [name] is back for follow-up. We went ahead and did a bone marrow biopsy and aspirate on him. This was after his fourth cycle of Vidaza. The bone marrow report ([id number]) showed refractory anemia with excess blasts. He had 7% blasts. Everything looked pretty similar r to his last bone marrow test which was done prior to chemotherapy.  His cytogenetics were normal.  He really has had no response from my point of view. He was of the bone marrow showed stable disease, we still are transfusing him every month or 3 weeks.  He is going to get a second opinion from Dr. Florene Glen at [facility]. I did this is a great idea. I encouraged this. I noted Florene Glen will be able to provide some insight as to what else might be done. It is possible that we may have to just continue him on the [place] and be patient.  He's had no cough. He's had no shortness of breath. He's had no change in bowel or bladder habits. He's had no rashes. He's had no fever. He's had no nausea or vomiting.  He does state that after his Vidaza, he just gets very fatigued and tired for about 2 weeks.  Overall, his performance status is ECOG 1.  Overall, his performance status is ECOG 1.  Medications:  Current outpatient prescriptions:  .  acetaminophen (TYLENOL) 325 MG tablet, Take 325 mg by mouth every 6 (six) hours as needed for moderate pain., Disp: , Rfl:  .  aspirin EC 81 MG tablet, Take 1 tablet (81 mg total) by mouth daily., Disp: , Rfl:  .  B Complex Vitamins (VITAMIN-B COMPLEX PO), Take by mouth daily., Disp: , Rfl:  .  clindamycin (CLEOCIN) 300 MG capsule, Take 2 capsules 60 minutes  before dental procedure., Disp: 2 capsule, Rfl: 0 .  LORazepam (ATIVAN) 0.5 MG tablet, Take 1 tablet (0.5 mg total) by mouth every 6 (six) hours as needed (Nausea or vomiting)., Disp: 30 tablet, Rfl: 3 .  metoprolol tartrate (LOPRESSOR) 25 MG tablet, Take 0.5 tablets (12.5 mg total) by mouth 2 (two) times daily., Disp: 90 tablet, Rfl: 3 .  nitroGLYCERIN (NITROSTAT) 0.4 MG SL tablet, Place 1 tablet (0.4 mg total) under the tongue every 5 (five) minutes as needed for chest pain., Disp: 25 tablet, Rfl: 6 .  pantoprazole (PROTONIX) 40 MG tablet, Take 40 mg by mouth daily., Disp: , Rfl:  .  predniSONE (DELTASONE) 20 MG tablet, Take 2 pills a day for 3 days, then 2 pills a day for 3 days, then 1 pill a day for 3 days, then 1/2 pill a day for 5 days, Disp: 40 tablet, Rfl: 2 .  prochlorperazine (COMPAZINE) 10 MG tablet, Take 1 tablet (10 mg total) by mouth every 6 (six) hours as needed (Nausea or vomiting)., Disp: 30 tablet, Rfl: 1 .  simvastatin (ZOCOR) 20 MG tablet, Take 1 tablet (20 mg total) by mouth at bedtime., Disp: 30 tablet, Rfl: 5 .  temazepam (RESTORIL) 15 MG capsule, Take 1 capsule (15 mg total) by mouth at bedtime as needed for sleep., Disp: 30 capsule, Rfl: 0  Allergies:  Allergies  Allergen Reactions  . Vicodin [Hydrocodone-Acetaminophen] Itching  . Cephalexin Other (See Comments)    Nausea and stomach cramps  . Phenergan [Promethazine Hcl] Other (See Comments)    "restless legs"    Past Medical History, Surgical history, Social history, and Family History were reviewed and updated.  Review of Systems: As above  Physical Exam:  height is _0  (1.778 m) and weight is 194 lb (87.998 kg). His oral temperature is 98.2 F (36.8 C). His blood pressure is 142/66 and his pulse is 61. His respiration is 18.   Wt Readings from Last 3 Encounters:  08/15/14 194 lb (87.998 kg)  08/09/14 185 lb (83.915 kg)  07/04/14 191 lb (86.637 kg)     Well-developed and well-nourished white  gentleman in no obvious distress. Head and neck exam shows no ocular or oral lesions. There are no palpable cervical or supraclavicular lymph does. Lungs are clear. Cardiac exam regular rate and rhythm with no murmurs, rubs or bruits. Abdomen is soft. Has good bowel sounds. There is no fluid wave. There is no palpable liver or spleen tip. Back exam shows no tenderness over the spine, ribs or hips. Extremities shows no clubbing, cyanosis or edema. Skin exam shows no rashes, ecchymoses or petechia. Neurological exam is nonfocal.  Lab Results  Component Value Date   WBC 4.1 08/15/2014   HGB 7.4* 08/15/2014   HCT 22.3* 08/15/2014   MCV 91 08/15/2014   PLT 87* 08/15/2014     Chemistry      Component Value Date/Time   NA 135 07/11/2014 1405   NA 138 06/21/2014 1105   K 3.4 07/11/2014 1405   K 4.2 06/21/2014 1105   CL 102 07/11/2014 1405   CL 105 06/21/2014 1105   CO2 27 07/11/2014 1405   CO2 24 06/21/2014 1105   BUN 30* 07/11/2014 1405   BUN 17 06/21/2014 1105   CREATININE 1.4* 07/11/2014 1405   CREATININE 1.19 06/21/2014 1105      Component Value Date/Time   CALCIUM 8.8 07/11/2014 1405   CALCIUM 8.9 06/21/2014 1105   ALKPHOS 60 07/11/2014 1405   ALKPHOS 59 06/21/2014 1105   AST 22 07/11/2014 1405   AST 15 06/21/2014 1105   ALT 33 07/11/2014 1405   ALT 19 06/21/2014 1105   BILITOT 1.20 07/11/2014 1405   BILITOT 0.6 06/21/2014 1105         Impression and Plan: [name] is 69 year old gentleman with myelodysplasia. He has normal cytogenetics. We did do the NGS of his blood we set show some abnormalities. Again, these might indicate an increased risk of transformation but it is not definitive.  Overall, I think he does have stable disease. Again, I might sure if continue him on the Vidaza is the way to go.  I thought maybe switch him over to Dacogen would be a good idea. I am not sure he really wants any more therapy. He says that the [place] just has made him so tired.  It  will be interesting to see what Dr. Florene Glen has to say.  I spent about 40 minutes with he and his wife. I went over the bone marrow report. I went over the treatment options that we have. We could stay on the [place]. We can switch him over to Dacogen. Or we can just watch.  He comes in weekly for lab work. I will give him Aranesp when we see him back.    Volanda Napoleon, MD 8/1/20166:17 PM

## 2014-08-15 NOTE — Progress Notes (Signed)
Patient requesting second opinion at [facility]  All records routed/faxed to  [facility] Fax# [phone]  Requests sent to pathology and imaging to send patient information

## 2014-08-16 ENCOUNTER — Encounter: Payer: Self-pay | Admitting: Hematology & Oncology

## 2014-08-16 ENCOUNTER — Ambulatory Visit: Payer: Medicare Other

## 2014-08-17 ENCOUNTER — Ambulatory Visit: Payer: Medicare Other

## 2014-08-18 ENCOUNTER — Ambulatory Visit: Payer: Medicare Other

## 2014-08-18 LAB — TISSUE HYBRIDIZATION (BONE MARROW)-NCBH

## 2014-08-18 LAB — CHROMOSOME ANALYSIS, BONE MARROW

## 2014-08-19 ENCOUNTER — Ambulatory Visit: Payer: Medicare Other

## 2014-08-22 ENCOUNTER — Telehealth: Payer: Self-pay | Admitting: *Deleted

## 2014-08-22 ENCOUNTER — Other Ambulatory Visit: Payer: Self-pay | Admitting: *Deleted

## 2014-08-22 ENCOUNTER — Encounter: Payer: Self-pay | Admitting: Hematology & Oncology

## 2014-08-22 ENCOUNTER — Ambulatory Visit: Payer: Medicare Other

## 2014-08-22 ENCOUNTER — Other Ambulatory Visit (HOSPITAL_BASED_OUTPATIENT_CLINIC_OR_DEPARTMENT_OTHER): Payer: Medicare Other

## 2014-08-22 ENCOUNTER — Ambulatory Visit (HOSPITAL_COMMUNITY)
Admission: RE | Admit: 2014-08-22 | Discharge: 2014-08-22 | Disposition: A | Discharge status: Home / Self Care | Payer: Medicare Other | Source: Ambulatory Visit | Attending: Hematology & Oncology | Admitting: Hematology & Oncology

## 2014-08-22 DIAGNOSIS — D46Z Other myelodysplastic syndromes: Secondary | ICD-10-CM

## 2014-08-22 LAB — MANUAL DIFFERENTIAL (CHCC SATELLITE)
ALC: 0.8 10e3/uL — ABNORMAL LOW (ref 0.9–3.3)
ANC (CHCC HP manual diff): 2.7 10e3/uL (ref 1.5–6.5)
LYMPH: 21 % (ref 14–48)
MONO: 8 % (ref 0–13)
PLT EST ~~LOC~~: DECREASED
SEG: 71 % (ref 40–75)

## 2014-08-22 LAB — CBC WITH DIFFERENTIAL (CANCER CENTER ONLY)
HCT: 19.1 % — ABNORMAL LOW (ref 38.7–49.9)
HGB: 6.2 g/dL — CL (ref 13.0–17.1)
MCH: 29.1 pg (ref 28.0–33.4)
MCHC: 32.5 g/dL (ref 32.0–35.9)
MCV: 90 fL (ref 82–98)
Platelets: 76 10*3/uL — ABNORMAL LOW (ref 145–400)
RBC: 2.13 10*6/uL — ABNORMAL LOW (ref 4.20–5.70)
RDW: 16.4 % — ABNORMAL HIGH (ref 11.1–15.7)
WBC: 3.8 10*3/uL — ABNORMAL LOW (ref 4.0–10.0)

## 2014-08-22 LAB — HOLD TUBE, BLOOD BANK - CHCC SATELLITE

## 2014-08-22 NOTE — Telephone Encounter (Signed)
Critical Value HGB 6.2 Dr Marin Olp notified and orders received.

## 2014-08-23 ENCOUNTER — Ambulatory Visit (HOSPITAL_BASED_OUTPATIENT_CLINIC_OR_DEPARTMENT_OTHER): Payer: Medicare Other

## 2014-08-23 VITALS — BP 120/68 | HR 60 | Temp 98.0°F | Resp 20

## 2014-08-23 DIAGNOSIS — D4622 Refractory anemia with excess of blasts 2: Secondary | ICD-10-CM | POA: Diagnosis present

## 2014-08-23 DIAGNOSIS — D46Z Other myelodysplastic syndromes: Secondary | ICD-10-CM | POA: Diagnosis not present

## 2014-08-23 DIAGNOSIS — D61818 Other pancytopenia: Secondary | ICD-10-CM

## 2014-08-23 LAB — PREPARE RBC (CROSSMATCH)

## 2014-08-23 MED ORDER — DIPHENHYDRAMINE HCL 25 MG PO CAPS
ORAL_CAPSULE | ORAL | Status: AC
  Filled 2014-08-23: qty 1

## 2014-08-23 MED ORDER — ACETAMINOPHEN 325 MG PO TABS
ORAL_TABLET | ORAL | Status: AC
Start: 2014-08-23 — End: 2014-08-23
  Filled 2014-08-23: qty 2

## 2014-08-23 MED ORDER — DIPHENHYDRAMINE HCL 25 MG PO CAPS
25.0000 mg | ORAL_CAPSULE | Freq: Once | ORAL | Status: AC
  Administered 2014-08-23: 25 mg via ORAL

## 2014-08-23 MED ORDER — FUROSEMIDE 10 MG/ML IJ SOLN
INTRAMUSCULAR | Status: AC
  Filled 2014-08-23: qty 4

## 2014-08-23 MED ORDER — SODIUM CHLORIDE 0.9 % IV SOLN
250.0000 mL | Freq: Once | INTRAVENOUS | Status: AC
  Administered 2014-08-23: 250 mL via INTRAVENOUS

## 2014-08-23 MED ORDER — ACETAMINOPHEN 325 MG PO TABS
650.0000 mg | ORAL_TABLET | Freq: Once | ORAL | Status: AC
  Administered 2014-08-23: 650 mg via ORAL

## 2014-08-23 MED ORDER — FUROSEMIDE 10 MG/ML IJ SOLN
20.0000 mg | Freq: Once | INTRAMUSCULAR | Status: AC
  Administered 2014-08-23: 20 mg via INTRAVENOUS

## 2014-08-23 NOTE — Patient Instructions (Signed)

## 2014-08-24 ENCOUNTER — Encounter: Payer: Self-pay | Admitting: Hematology & Oncology

## 2014-08-24 LAB — TYPE AND SCREEN
ABO/RH(D): A POS
ANTIBODY SCREEN: NEGATIVE
UNIT DIVISION: 0
Unit division: 0

## 2014-08-25 ENCOUNTER — Encounter (HOSPITAL_COMMUNITY): Payer: Self-pay

## 2014-08-26 ENCOUNTER — Other Ambulatory Visit: Payer: Self-pay | Admitting: Hematology & Oncology

## 2014-08-28 ENCOUNTER — Encounter: Payer: Self-pay | Admitting: Hematology & Oncology

## 2014-08-29 ENCOUNTER — Other Ambulatory Visit (HOSPITAL_BASED_OUTPATIENT_CLINIC_OR_DEPARTMENT_OTHER): Payer: Medicare Other

## 2014-08-29 ENCOUNTER — Other Ambulatory Visit: Payer: Self-pay

## 2014-08-29 DIAGNOSIS — D469 Myelodysplastic syndrome, unspecified: Secondary | ICD-10-CM

## 2014-08-29 DIAGNOSIS — D61818 Other pancytopenia: Secondary | ICD-10-CM

## 2014-08-29 DIAGNOSIS — D4620 Refractory anemia with excess of blasts, unspecified: Secondary | ICD-10-CM

## 2014-08-29 LAB — CBC WITH DIFFERENTIAL (CANCER CENTER ONLY)
HCT: 22.6 % — ABNORMAL LOW (ref 38.7–49.9)
HGB: 7.7 g/dL — ABNORMAL LOW (ref 13.0–17.1)
MCH: 30.4 pg (ref 28.0–33.4)
MCHC: 34.1 g/dL (ref 32.0–35.9)
MCV: 89 fL (ref 82–98)
PLATELETS: 43 10*3/uL — AB (ref 145–400)
RBC: 2.53 10*6/uL — AB (ref 4.20–5.70)
RDW: 16 % — AB (ref 11.1–15.7)
WBC: 3.5 10*3/uL — AB (ref 4.0–10.0)

## 2014-08-29 LAB — MANUAL DIFFERENTIAL (CHCC SATELLITE)
ALC: 1.1 10*3/uL (ref 0.9–3.3)
ANC (CHCC MAN DIFF): 1.9 10*3/uL (ref 1.5–6.5)
LYMPH: 32 % (ref 14–48)
MONO: 14 % — ABNORMAL HIGH (ref 0–13)
PLT EST ~~LOC~~: DECREASED
SEG: 54 % (ref 40–75)

## 2014-08-29 NOTE — Patient Outreach (Signed)
[facility]) Care Management  08/29/2014  [name] [birth year] [id number]   Telephone call to patient regarding high risk list referral.  Discussed and offered [facility] care management services to patient.  Patient declined services at this time.  Patient states he is presently receiving care and treatment for cancer.  Patient states he is seeing a doctor in [place] and feels as though he is getting what he needs at this time.  Patient denies having any additional needs.  Patient verified he has contact phone number and name of [facility] care management in the event future needs arise.  PLAN; RNCM will refer to Lurline Del to close due to refusal of services.  RNCM will notify patients primary MD of refusal of services.   Quinn Plowman RN,BSN,CCM [place] Coordinator [phone]

## 2014-09-01 ENCOUNTER — Encounter: Payer: Self-pay | Admitting: Gastroenterology

## 2014-09-02 ENCOUNTER — Other Ambulatory Visit: Payer: Self-pay

## 2014-09-02 ENCOUNTER — Emergency Department (HOSPITAL_BASED_OUTPATIENT_CLINIC_OR_DEPARTMENT_OTHER): Payer: Medicare Other

## 2014-09-02 ENCOUNTER — Emergency Department (HOSPITAL_BASED_OUTPATIENT_CLINIC_OR_DEPARTMENT_OTHER)
Admission: EM | Admit: 2014-09-02 | Discharge: 2014-09-02 | Disposition: A | Discharge status: Home / Self Care | Payer: Medicare Other | Attending: Emergency Medicine | Admitting: Emergency Medicine

## 2014-09-02 ENCOUNTER — Encounter (HOSPITAL_BASED_OUTPATIENT_CLINIC_OR_DEPARTMENT_OTHER): Payer: Self-pay

## 2014-09-02 DIAGNOSIS — Z720 Tobacco use: Secondary | ICD-10-CM | POA: Diagnosis not present

## 2014-09-02 DIAGNOSIS — E780 Pure hypercholesterolemia: Secondary | ICD-10-CM | POA: Diagnosis not present

## 2014-09-02 DIAGNOSIS — Z79899 Other long term (current) drug therapy: Secondary | ICD-10-CM | POA: Diagnosis not present

## 2014-09-02 DIAGNOSIS — Z7982 Long term (current) use of aspirin: Secondary | ICD-10-CM | POA: Diagnosis not present

## 2014-09-02 DIAGNOSIS — M1990 Unspecified osteoarthritis, unspecified site: Secondary | ICD-10-CM | POA: Diagnosis not present

## 2014-09-02 DIAGNOSIS — R091 Pleurisy: Secondary | ICD-10-CM | POA: Diagnosis not present

## 2014-09-02 DIAGNOSIS — I2510 Atherosclerotic heart disease of native coronary artery without angina pectoris: Secondary | ICD-10-CM | POA: Insufficient documentation

## 2014-09-02 DIAGNOSIS — Z862 Personal history of diseases of the blood and blood-forming organs and certain disorders involving the immune mechanism: Secondary | ICD-10-CM | POA: Diagnosis not present

## 2014-09-02 DIAGNOSIS — I10 Essential (primary) hypertension: Secondary | ICD-10-CM | POA: Insufficient documentation

## 2014-09-02 DIAGNOSIS — Z872 Personal history of diseases of the skin and subcutaneous tissue: Secondary | ICD-10-CM | POA: Insufficient documentation

## 2014-09-02 DIAGNOSIS — R0602 Shortness of breath: Secondary | ICD-10-CM | POA: Diagnosis present

## 2014-09-02 DIAGNOSIS — K219 Gastro-esophageal reflux disease without esophagitis: Secondary | ICD-10-CM | POA: Diagnosis not present

## 2014-09-02 LAB — BASIC METABOLIC PANEL
ANION GAP: 11 (ref 5–15)
BUN: 21 mg/dL — ABNORMAL HIGH (ref 6–20)
CALCIUM: 9.4 mg/dL (ref 8.9–10.3)
CO2: 23 mmol/L (ref 22–32)
Chloride: 105 mmol/L (ref 101–111)
Creatinine, Ser: 1.18 mg/dL (ref 0.61–1.24)
Glucose, Bld: 140 mg/dL — ABNORMAL HIGH (ref 65–99)
POTASSIUM: 4.1 mmol/L (ref 3.5–5.1)
SODIUM: 139 mmol/L (ref 135–145)

## 2014-09-02 LAB — CBC WITH DIFFERENTIAL/PLATELET
BAND NEUTROPHILS: 3 % (ref 0–10)
BASOS ABS: 0 10*3/uL (ref 0.0–0.1)
Basophils Relative: 1 % (ref 0–1)
Blasts: 0 %
EOS ABS: 0 10*3/uL (ref 0.0–0.7)
EOS PCT: 0 % (ref 0–5)
HCT: 21.5 % — ABNORMAL LOW (ref 39.0–52.0)
Hemoglobin: 7 g/dL — ABNORMAL LOW (ref 13.0–17.0)
Lymphocytes Relative: 16 % (ref 12–46)
Lymphs Abs: 0.5 10*3/uL — ABNORMAL LOW (ref 0.7–4.0)
MCH: 28.9 pg (ref 26.0–34.0)
MCHC: 32.6 g/dL (ref 30.0–36.0)
MCV: 88.8 fL (ref 78.0–100.0)
METAMYELOCYTES PCT: 0 %
MONO ABS: 0.5 10*3/uL (ref 0.1–1.0)
MYELOCYTES: 0 %
Monocytes Relative: 18 % — ABNORMAL HIGH (ref 3–12)
Neutro Abs: 2 10*3/uL (ref 1.7–7.7)
Neutrophils Relative %: 62 % (ref 43–77)
PLATELETS: 53 10*3/uL — AB (ref 150–400)
Promyelocytes Absolute: 0 %
RBC: 2.42 MIL/uL — AB (ref 4.22–5.81)
RDW: 16 % — ABNORMAL HIGH (ref 11.5–15.5)
WBC: 3 10*3/uL — AB (ref 4.0–10.5)
nRBC: 0 /100 WBC

## 2014-09-02 MED ORDER — SODIUM CHLORIDE 0.9 % IV SOLN: Freq: Once | INTRAVENOUS | Status: DC

## 2014-09-02 MED ORDER — ONDANSETRON HCL 4 MG/2ML IJ SOLN
4.0000 mg | Freq: Once | INTRAMUSCULAR | Status: AC
  Administered 2014-09-02: 4 mg via INTRAVENOUS
  Filled 2014-09-02: qty 2

## 2014-09-02 MED ORDER — SODIUM CHLORIDE 0.9 % IV BOLUS (SEPSIS)
500.0000 mL | Freq: Once | INTRAVENOUS | Status: AC
  Administered 2014-09-02: 500 mL via INTRAVENOUS

## 2014-09-02 MED ORDER — MORPHINE SULFATE (PF) 4 MG/ML IV SOLN
4.0000 mg | INTRAVENOUS | Status: DC | PRN
  Administered 2014-09-02: 4 mg via INTRAVENOUS
  Filled 2014-09-02 (×2): qty 1

## 2014-09-02 MED ORDER — OXYCODONE-ACETAMINOPHEN 5-325 MG PO TABS: 2.0000 | ORAL_TABLET | ORAL | Status: DC | PRN

## 2014-09-02 MED ORDER — IOHEXOL 350 MG/ML SOLN
100.0000 mL | Freq: Once | INTRAVENOUS | Status: AC | PRN
  Administered 2014-09-02: 100 mL via INTRAVENOUS

## 2014-09-02 MED ORDER — MORPHINE SULFATE (PF) 4 MG/ML IV SOLN
4.0000 mg | Freq: Once | INTRAVENOUS | Status: AC
Start: 2014-09-02 — End: 2014-09-02
  Administered 2014-09-02: 4 mg via INTRAVENOUS

## 2014-09-02 NOTE — Discharge Instructions (Signed)
Please come for your scheduled appointment with doctor in of her on Monday, unless different plans arise from your second opinion today at [facility]. They will discuss transfusion with you on Monday. Return here with any worsening of your symptoms in the interval.   Pleurisy Pleurisy is an inflammation and swelling of the lining of the lungs (pleura). Because of this inflammation, it hurts to breathe. It can be aggravated by coughing, laughing, or deep breathing. Pleurisy is often caused by an underlying infection or disease.  HOME CARE INSTRUCTIONS  Monitor your pleurisy for any changes. The following actions may help to alleviate any discomfort you are experiencing:  Medicine may help with pain. Only take over-the-counter or prescription medicines for pain, discomfort, or fever as directed by your health care provider.  Only take antibiotic medicine as directed. Make sure to finish it even if you start to feel better. SEEK MEDICAL CARE IF:   Your pain is not controlled with medicine or is increasing.  You have an increase in pus-like (purulent) secretions brought up with coughing. SEEK IMMEDIATE MEDICAL CARE IF:   You have blue or dark lips, fingernails, or toenails.  You are coughing up blood.  You have increased difficulty breathing.  You have continuing pain unrelieved by medicine or pain lasting more than 1 week.  You have pain that radiates into your neck, arms, or jaw.  You develop increased shortness of breath or wheezing.  You develop a fever, rash, vomiting, fainting, or other serious symptoms. MAKE SURE YOU:  Understand these instructions.   Will watch your condition.   Will get help right away if you are not doing well or get worse.  Document Released: 12/31/2004 Document Revised: 09/02/2012 Document Reviewed: 06/14/2012 [facility] Patient Information 2015 [place], [place]. This information is not intended to replace advice given to you by your health care  provider. Make sure you discuss any questions you have with your health care provider.

## 2014-09-02 NOTE — ED Notes (Signed)
Pt reports he awakened with SOB and pain in "right lung".  Denies chest pain.  No cough per patient, however wife states he was coughing during sleep. Denies fever.

## 2014-09-02 NOTE — ED Notes (Signed)
MD at bedside. 

## 2014-09-02 NOTE — Patient Outreach (Signed)
[facility]) Care Management  08/29/2014  [name] 07-02-1945 [id number]   Notification from Quinn Plowman, RN to close case due to patient refused [place] Management services.  Thanks, [name]. [place], [place] Assistant Phone: [phone] Fax: [phone]

## 2014-09-02 NOTE — ED Notes (Signed)
Patient transported to CT 

## 2014-09-02 NOTE — ED Provider Notes (Signed)
CSN: [id number]     Arrival date & time 09/02/14  0902 History   First MD Initiated Contact with Patient 09/02/14 [phone]     Chief Complaint  Patient presents with  . Shortness of Breath      HPI  Patient presents evaluation of right-sided chest pain. Right lateral pain starting this morning upon awakening. His wife states he was "coughing during his sleep. No sputum or hemoptysis. No fevers no chills no frank dyspnea. He is undergoing chemotherapy for mild dysplastic syndrome. Scheduled today for a second opinion regarding his failure to respond to resolve with his current chemotherapeutic plan.  No lower extremity pain or swelling. No prior DVT or PE.  Past Medical History  Diagnosis Date  . Coronary atherosclerosis of native coronary artery     a. 10/2008 inf STEMI/PCI: LM 50d (IVUS-borderline lesion->med rx), LAD min irregs, LCX 60m, 70d, OM nl, RCA 128m (3.5x28 Vision BMS);  b. 11/2008 Lexiscan MV: EF 65%, no isch/scar.  . Essential hypertension, benign   . Pure hypercholesterolemia   . AAA (abdominal aortic aneurysm)     a. 05/2013 CT: 5.8 cm AAA.  Marland Kitchen Diverticulitis     a. 05/2013 CT: descending/sigmoid jxn w/o abscess.  . Osteoarthritis   . Tobacco abuse     a. ongoing - 1ppd for better part of 50 yrs.  . Normocytic anemia   . Cellulitis 06/10/2013    Right antecubital fossa at site of IV  03/12/13  . PONV (postoperative nausea and vomiting)   . Diverticulitis   . GERD (gastroesophageal reflux disease)   . MDS (myelodysplastic syndrome), high grade 04/13/2014   Past Surgical History  Procedure Laterality Date  . Cardiac stents  2010  . Cholecystectomy N/A 06/19/2013    Procedure: LAPAROSCOPIC CHOLECYSTECTOMY;  Surgeon: Harl Bowie, MD;  Location: [place];  Service: General;  Laterality: N/A;  . Abdominal aortic endovascular stent graft N/A 07/02/2013    Procedure: ABDOMINAL AORTIC ENDOVASCULAR STENT GRAFT;  Surgeon: Serafina Mitchell, MD;  Location: [facility] OR;  Service: Vascular;   Laterality: N/A;  . Esophagogastroduodenoscopy N/A 08/05/2013    Procedure: ESOPHAGOGASTRODUODENOSCOPY (EGD);  Surgeon: Lear Ng, MD;  Location: [facility] ENDOSCOPY;  Service: Endoscopy;  Laterality: N/A;  . Eye surgery Left     cataract surgery  . Back surgery      cervical fusion  . Colonoscopy with propofol N/A 10/15/2013    Procedure: COLONOSCOPY WITH PROPOFOL;  Surgeon: Lear Ng, MD;  Location: [place];  Service: Endoscopy;  Laterality: N/A;  . Fetal blood transfusion  March 16,17,18, 2016  . Bone marrow biopsy  April 01, 2014   Family History  Problem Relation Age of Onset  . Heart attack Brother     14s  . Cancer Father     Lung  . Cancer Mother     Brain  . Cancer Brother     Kidney   Social History  Substance Use Topics  . Smoking status: Current Every Day Smoker -- 1.00 packs/day for 60 years    Types: Cigarettes    Start date: 04/05/1964  . Smokeless tobacco: Never Used     Comment: 5-23--   STILL SMOKING  . Alcohol Use: No    Review of Systems  Constitutional: Negative for fever, chills, diaphoresis, appetite change and fatigue.  HENT: Negative for mouth sores, sore throat and trouble swallowing.   Eyes: Negative for visual disturbance.  Respiratory: Negative for cough, chest tightness, shortness of breath  and wheezing.   Cardiovascular: Positive for chest pain.  Gastrointestinal: Negative for nausea, vomiting, abdominal pain, diarrhea and abdominal distention.  Endocrine: Negative for polydipsia, polyphagia and polyuria.  Genitourinary: Negative for dysuria, frequency and hematuria.  Musculoskeletal: Negative for gait problem.  Skin: Negative for color change, pallor and rash.  Neurological: Negative for dizziness, syncope, light-headedness and headaches.  Hematological: Does not bruise/bleed easily.  Psychiatric/Behavioral: Negative for behavioral problems and confusion.      Allergies  Vicodin; Cephalexin; and Phenergan  Home  Medications   Prior to Admission medications   Medication Sig Start Date End Date Taking? Authorizing Provider  acetaminophen (TYLENOL) 325 MG tablet Take 325 mg by mouth every 6 (six) hours as needed for moderate pain.    Historical Provider, MD  aspirin EC 81 MG tablet Take 1 tablet (81 mg total) by mouth daily. 03/30/14   Sherren Mocha, MD  B Complex Vitamins (VITAMIN-B COMPLEX PO) Take by mouth daily.    Historical Provider, MD  clindamycin (CLEOCIN) 300 MG capsule Take 2 capsules 60 minutes before dental procedure. 05/09/14   Volanda Napoleon, MD  LORazepam (ATIVAN) 0.5 MG tablet Take 1 tablet (0.5 mg total) by mouth every 6 (six) hours as needed (Nausea or vomiting). 04/11/14   Volanda Napoleon, MD  metoprolol tartrate (LOPRESSOR) 25 MG tablet Take 0.5 tablets (12.5 mg total) by mouth 2 (two) times daily. 08/03/13   Sherren Mocha, MD  nitroGLYCERIN (NITROSTAT) 0.4 MG SL tablet Place 1 tablet (0.4 mg total) under the tongue every 5 (five) minutes as needed for chest pain. 03/29/14   Sherren Mocha, MD  oxyCODONE-acetaminophen (PERCOCET/ROXICET) 5-325 MG per tablet Take 2 tablets by mouth every 4 (four) hours as needed. 09/02/14   Tanna Furry, MD  pantoprazole (PROTONIX) 40 MG tablet Take 40 mg by mouth daily. 08/06/13   Nita Sells, MD  predniSONE (DELTASONE) 20 MG tablet Take 2 pills a day for 3 days, then 2 pills a day for 3 days, then 1 pill a day for 3 days, then 1/2 pill a day for 5 days 05/09/14   Volanda Napoleon, MD  prochlorperazine (COMPAZINE) 10 MG tablet Take 1 tablet (10 mg total) by mouth every 6 (six) hours as needed (Nausea or vomiting). 04/11/14   Volanda Napoleon, MD  simvastatin (ZOCOR) 20 MG tablet Take 1 tablet (20 mg total) by mouth at bedtime. 03/30/14   Sherren Mocha, MD  temazepam (RESTORIL) 15 MG capsule TAKE 1 CAPSULE BY MOUTH AT BEDTIME AS NEEDED 08/26/14   Volanda Napoleon, MD   BP 130/68 mmHg  Pulse 56  Temp(Src) 98.3 F (36.8 C) (Oral)  Resp 16  Ht $R'5\' 10"'oA$  (1.778  m)  Wt 195 lb (88.451 kg)  BMI 27.98 kg/m2  SpO2 99% Physical Exam  Constitutional: He is oriented to person, place, and time. He appears well-developed and well-nourished. No distress.  HENT:  Head: Normocephalic.  Eyes: Conjunctivae are normal. Pupils are equal, round, and reactive to light. No scleral icterus.  Neck: Normal range of motion. Neck supple. No thyromegaly present.  Cardiovascular: Normal rate and regular rhythm.  Exam reveals no gallop and no friction rub.   No murmur heard. Pulmonary/Chest: Effort normal and breath sounds normal. No respiratory distress. He has no wheezes. He has no rales.    Abdominal: Soft. Bowel sounds are normal. He exhibits no distension. There is no tenderness. There is no rebound.  Musculoskeletal: Normal range of motion.  Neurological: He is alert and  oriented to person, place, and time.  Skin: Skin is warm and dry. No rash noted.  Psychiatric: He has a normal mood and affect. His behavior is normal.    ED Course  Procedures (including critical care time) Labs Review Labs Reviewed  CBC WITH DIFFERENTIAL/PLATELET - Abnormal; Notable for the following:    WBC 3.0 (*)    RBC 2.42 (*)    Hemoglobin 7.0 (*)    HCT 21.5 (*)    RDW 16.0 (*)    Platelets 53 (*)    Monocytes Relative 18 (*)    Lymphs Abs 0.5 (*)    All other components within normal limits  BASIC METABOLIC PANEL - Abnormal; Notable for the following:    Glucose, Bld 140 (*)    BUN 21 (*)    All other components within normal limits    Imaging Review Ct Angio Chest Pe W/cm &/or Wo Cm  09/02/2014   CLINICAL DATA:  69 year old with shortness of breath and right lung pain.  EXAM: CT ANGIOGRAPHY CHEST WITH CONTRAST  TECHNIQUE: Multidetector CT imaging of the chest was performed using the standard protocol during bolus administration of intravenous contrast. Multiplanar CT image reconstructions and MIPs were obtained to evaluate the vascular anatomy.  CONTRAST:  130mL OMNIPAQUE  IOHEXOL 350 MG/ML SOLN  COMPARISON:  03/30/2014 and 06/07/2013  FINDINGS: Negative for pulmonary embolism. Again noted are prominent and numerous mediastinal lymph nodes. Pretracheal lymph node on sequence 5, image 41 measures 0.8 cm in the short axis and stable. Precarinal lymph node on sequence 5, image 43 measures 1.1 cm and previously measured 1.4 cm. Stable right hilar tissue on sequence 5, image 40 measure 1.2 cm in the short axis. Small amount of fluid in pericardial recess. Lymph node in the anterior mediastinum on sequence 5, image 38 measures 0.8 cm, previously measured 1.1 cm. There is no significant axillary lymphadenopathy. No significant pericardial or pleural fluid. No acute abnormality to the thoracic aorta. Punctate low-density structure along the left hepatic lobe is likely an incidental finding and similar to the previous examination. No acute abnormality in the upper abdomen.  The trachea and mainstem bronchi are patent. Again noted are blebs or small bulla at the lung apices. Stable 5 mm nodule the right lung apex on sequence 7, image 22. Few ground-glass densities along the posterior right lower lobe could represent atelectasis. No large areas of airspace disease or consolidation. New focal pleural-based density along the posterior right lower lobe on sequence 7, image 79 is likely related to focal atelectasis. No acute bone abnormality.  Review of the MIP images confirms the above findings.  IMPRESSION: Negative for pulmonary embolism.  Chronic mediastinal and hilar lymphadenopathy. These findings have minimally changed since 2015. Some of the mediastinal lymph nodes have slightly decreased in size as described.  Few patchy densities along the posterior right lower lobe suggest atelectasis. No significant airspace disease or consolidation.  Stable 5 mm nodule in the right upper lung. Recommend 12 month follow-up CT to ensure greater than 2 years of stability.   Electronically Signed   By: Markus Daft M.D.   On: 09/02/2014 11:17   I have personally reviewed and evaluated these images and lab results as part of my medical decision-making.   EKG Interpretation   Date/Time:  Friday September 02 2014 09:48:15 EDT Ventricular Rate:  58 PR Interval:  168 QRS Duration: 90 QT Interval:  444 QTC Calculation: 435 R Axis:   49 Text Interpretation:  Sinus bradycardia Otherwise normal ECG Confirmed by  Jeneen Rinks  MD, [place] ([id number]) on 09/02/2014 12:26:41 PM      MDM   Final diagnoses:  Pleurisy    CT angios normal. His hemoglobin is 7 he has been getting transfused for levels of 7. They're quite anxious to be discharged as they have a an appointment at hematology oncology at [facility] in about 1 hour. Plan is treatment for his pain with Percocet. Information regarding pleurisy. I discussed the case with Dr. Lutricia Feil area patient will follow up on Monday for his scheduled chemotherapy and they will discuss transfusion at point unless another plan shakes out from his new follow-up appointment today.    Tanna Furry, MD 09/02/14 [phone]

## 2014-09-04 ENCOUNTER — Encounter: Payer: Self-pay | Admitting: Hematology & Oncology

## 2014-09-05 ENCOUNTER — Other Ambulatory Visit: Payer: Self-pay | Admitting: *Deleted

## 2014-09-05 ENCOUNTER — Encounter: Payer: Self-pay | Admitting: Hematology & Oncology

## 2014-09-05 ENCOUNTER — Ambulatory Visit (HOSPITAL_BASED_OUTPATIENT_CLINIC_OR_DEPARTMENT_OTHER): Payer: Medicare Other | Admitting: Hematology & Oncology

## 2014-09-05 ENCOUNTER — Other Ambulatory Visit (HOSPITAL_BASED_OUTPATIENT_CLINIC_OR_DEPARTMENT_OTHER): Payer: Medicare Other

## 2014-09-05 ENCOUNTER — Telehealth: Payer: Self-pay | Admitting: *Deleted

## 2014-09-05 ENCOUNTER — Ambulatory Visit (HOSPITAL_BASED_OUTPATIENT_CLINIC_OR_DEPARTMENT_OTHER): Payer: Medicare Other

## 2014-09-05 VITALS — BP 120/67 | HR 60 | Temp 98.0°F | Resp 18

## 2014-09-05 DIAGNOSIS — D46Z Other myelodysplastic syndromes: Secondary | ICD-10-CM | POA: Diagnosis not present

## 2014-09-05 DIAGNOSIS — Z5111 Encounter for antineoplastic chemotherapy: Secondary | ICD-10-CM | POA: Diagnosis not present

## 2014-09-05 DIAGNOSIS — D61818 Other pancytopenia: Secondary | ICD-10-CM

## 2014-09-05 DIAGNOSIS — D696 Thrombocytopenia, unspecified: Secondary | ICD-10-CM

## 2014-09-05 LAB — MANUAL DIFFERENTIAL (CHCC SATELLITE)
ALC: 0.7 10*3/uL — ABNORMAL LOW (ref 0.9–3.3)
ANC (CHCC MAN DIFF): 2.5 10*3/uL (ref 1.5–6.5)
BAND NEUTROPHILS: 1 % (ref 0–10)
LYMPH: 22 % (ref 14–48)
MONO: 3 % (ref 0–13)
Metamyelocytes: 1 % — ABNORMAL HIGH (ref 0–0)
PLT EST ~~LOC~~: DECREASED
SEG: 73 % (ref 40–75)

## 2014-09-05 LAB — CBC WITH DIFFERENTIAL (CANCER CENTER ONLY)
HEMATOCRIT: 19.8 % — AB (ref 38.7–49.9)
HEMOGLOBIN: 6.6 g/dL — AB (ref 13.0–17.1)
MCH: 30 pg (ref 28.0–33.4)
MCHC: 33.3 g/dL (ref 32.0–35.9)
MCV: 90 fL (ref 82–98)
Platelets: 50 10*3/uL — ABNORMAL LOW (ref 145–400)
RBC: 2.2 10*6/uL — ABNORMAL LOW (ref 4.20–5.70)
RDW: 15.6 % (ref 11.1–15.7)
WBC: 3.3 10*3/uL — AB (ref 4.0–10.0)

## 2014-09-05 LAB — PREPARE RBC (CROSSMATCH)

## 2014-09-05 LAB — HOLD TUBE, BLOOD BANK - CHCC SATELLITE

## 2014-09-05 MED ORDER — FAMOTIDINE IN NACL 20-0.9 MG/50ML-% IV SOLN
40.0000 mg | Freq: Once | INTRAVENOUS | Status: AC
  Administered 2014-09-05: 40 mg via INTRAVENOUS

## 2014-09-05 MED ORDER — AZACITIDINE CHEMO SQ INJECTION
96.0000 mg/m2 | Freq: Once | INTRAMUSCULAR | Status: AC
  Administered 2014-09-05: 200 mg via SUBCUTANEOUS
  Filled 2014-09-05: qty 8

## 2014-09-05 MED ORDER — METHYLPREDNISOLONE SODIUM SUCC 125 MG IJ SOLR
125.0000 mg | Freq: Once | INTRAMUSCULAR | Status: AC
  Administered 2014-09-05: 125 mg via INTRAVENOUS

## 2014-09-05 MED ORDER — ONDANSETRON HCL 8 MG PO TABS
8.0000 mg | ORAL_TABLET | Freq: Once | ORAL | Status: AC
  Administered 2014-09-05: 8 mg via ORAL

## 2014-09-05 MED ORDER — ONDANSETRON HCL 8 MG PO TABS
ORAL_TABLET | ORAL | Status: AC
  Filled 2014-09-05: qty 1

## 2014-09-05 MED ORDER — FAMOTIDINE IN NACL 20-0.9 MG/50ML-% IV SOLN
INTRAVENOUS | Status: AC
  Filled 2014-09-05: qty 100

## 2014-09-05 MED ORDER — METHYLPREDNISOLONE SODIUM SUCC 125 MG IJ SOLR
INTRAMUSCULAR | Status: AC
  Filled 2014-09-05: qty 2

## 2014-09-05 NOTE — Patient Instructions (Signed)
Azacitidine suspension for injection (subcutaneous use) What is this medicine? AZACITIDINE (ay [place]) is a chemotherapy drug. This medicine reduces the growth of cancer cells and can suppress the immune system. It is used for treating myelodysplastic syndrome or some types of leukemia. This medicine may be used for other purposes; ask your health care provider or pharmacist if you have questions. COMMON BRAND NAME(S): Vidaza What should I tell my health care provider before I take this medicine? They need to know if you have any of these conditions: -infection (especially a virus infection such as chickenpox, cold sores, or herpes) -kidney disease -liver disease -liver tumors -an unusual or allergic reaction to azacitidine, mannitol, other medicines, foods, dyes, or preservatives -pregnant or trying to get pregnant -breast-feeding How should I use this medicine? This medicine is for injection under the skin. It is administered in a hospital or clinic by a specially trained health care professional. Talk to your pediatrician regarding the use of this medicine in children. While this drug may be prescribed for selected conditions, precautions do apply. Overdosage: If you think you have taken too much of this medicine contact a poison control center or emergency room at once. NOTE: This medicine is only for you. Do not share this medicine with others. What if I miss a dose? It is important not to miss your dose. Call your doctor or health care professional if you are unable to keep an appointment. What may interact with this medicine? -vaccines Talk to your doctor or health care professional before taking any of these medicines: -acetaminophen -aspirin -ibuprofen -ketoprofen -naproxen This list may not describe all possible interactions. Give your health care provider a list of all the medicines, herbs, non-prescription drugs, or dietary supplements you use. Also tell them if you  smoke, drink alcohol, or use illegal drugs. Some items may interact with your medicine. What should I watch for while using this medicine? Visit your doctor for checks on your progress. This drug may make you feel generally unwell. This is not uncommon, as chemotherapy can affect healthy cells as well as cancer cells. Report any side effects. Continue your course of treatment even though you feel ill unless your doctor tells you to stop. In some cases, you may be given additional medicines to help with side effects. Follow all directions for their use. Call your doctor or health care professional for advice if you get a fever, chills or sore throat, or other symptoms of a cold or flu. Do not treat yourself. This drug decreases your body's ability to fight infections. Try to avoid being around people who are sick. This medicine may increase your risk to bruise or bleed. Call your doctor or health care professional if you notice any unusual bleeding. Be careful brushing and flossing your teeth or using a toothpick because you may get an infection or bleed more easily. If you have any dental work done, tell your dentist you are receiving this medicine. Avoid taking products that contain aspirin, acetaminophen, ibuprofen, naproxen, or ketoprofen unless instructed by your doctor. These medicines may hide a fever. Do not have any vaccinations without your doctor's approval and avoid anyone who has recently had oral polio vaccine. Do not become pregnant while taking this medicine. Women should inform their doctor if they wish to become pregnant or think they might be pregnant. There is a potential for serious side effects to an unborn child. Talk to your health care professional or pharmacist for more information.  Do not breast-feed an infant while taking this medicine. If you are a man, you should not father a child while receiving treatment. What side effects may I notice from receiving this medicine? Side  effects that you should report to your doctor or health care professional as soon as possible: -allergic reactions like skin rash, itching or hives, swelling of the face, lips, or tongue -low blood counts - this medicine may decrease the number of white blood cells, red blood cells and platelets. You may be at increased risk for infections and bleeding. -signs of infection - fever or chills, cough, sore throat, pain or difficulty passing urine -signs of decreased platelets or bleeding - bruising, pinpoint red spots on the skin, black, tarry stools, blood in the urine -signs of decreased red blood cells - unusually weak or tired, fainting spells, lightheadedness -reactions at the injection site including redness, pain, itching, or bruising -breathing problems -changes in vision -fever -mouth sores -stomach pain -vomiting Side effects that usually do not require medical attention (report to your doctor or health care professional if they continue or are bothersome): -constipation -diarrhea -loss of appetite -nausea -pain or redness at the injection site -weak or tired This list may not describe all possible side effects. Call your doctor for medical advice about side effects. You may report side effects to FDA at 1-800-FDA-1088. Where should I keep my medicine? This drug is given in a hospital or clinic and will not be stored at home. NOTE: This sheet is a summary. It may not cover all possible information. If you have questions about this medicine, talk to your doctor, pharmacist, or health care provider.  2015, [employer]. (2007-03-26 11:04:07)

## 2014-09-05 NOTE — Telephone Encounter (Signed)
Critical Value HGB 6.6 Dr Marin Olp notified of results. No orders at this time

## 2014-09-06 ENCOUNTER — Ambulatory Visit (HOSPITAL_BASED_OUTPATIENT_CLINIC_OR_DEPARTMENT_OTHER): Payer: Medicare Other

## 2014-09-06 VITALS — BP 107/52 | HR 53 | Temp 98.2°F | Resp 18

## 2014-09-06 DIAGNOSIS — Z5111 Encounter for antineoplastic chemotherapy: Secondary | ICD-10-CM

## 2014-09-06 DIAGNOSIS — D61818 Other pancytopenia: Secondary | ICD-10-CM

## 2014-09-06 DIAGNOSIS — D46Z Other myelodysplastic syndromes: Secondary | ICD-10-CM | POA: Diagnosis not present

## 2014-09-06 MED ORDER — DIPHENHYDRAMINE HCL 25 MG PO CAPS
ORAL_CAPSULE | ORAL | Status: AC
  Filled 2014-09-06: qty 1

## 2014-09-06 MED ORDER — FAMOTIDINE IN NACL 20-0.9 MG/50ML-% IV SOLN
INTRAVENOUS | Status: AC
  Filled 2014-09-06: qty 100

## 2014-09-06 MED ORDER — METHYLPREDNISOLONE SODIUM SUCC 125 MG IJ SOLR
INTRAMUSCULAR | Status: AC
  Filled 2014-09-06: qty 2

## 2014-09-06 MED ORDER — DIPHENHYDRAMINE HCL 25 MG PO CAPS
25.0000 mg | ORAL_CAPSULE | Freq: Once | ORAL | Status: AC
  Administered 2014-09-06: 25 mg via ORAL

## 2014-09-06 MED ORDER — SODIUM CHLORIDE 0.9 % IV SOLN
Freq: Once | INTRAVENOUS | Status: AC
  Administered 2014-09-06: 08:00:00 via INTRAVENOUS

## 2014-09-06 MED ORDER — FUROSEMIDE 10 MG/ML IJ SOLN: 20.0000 mg | Freq: Once | INTRAMUSCULAR | Status: DC

## 2014-09-06 MED ORDER — SODIUM CHLORIDE 0.9 % IV SOLN
Freq: Once | INTRAVENOUS | Status: AC
  Administered 2014-09-06: 10:00:00 via INTRAVENOUS
  Filled 2014-09-06: qty 4

## 2014-09-06 MED ORDER — FAMOTIDINE IN NACL 20-0.9 MG/50ML-% IV SOLN
40.0000 mg | Freq: Two times a day (BID) | INTRAVENOUS | Status: DC
  Administered 2014-09-06: 40 mg via INTRAVENOUS

## 2014-09-06 MED ORDER — ACETAMINOPHEN 325 MG PO TABS
ORAL_TABLET | ORAL | Status: AC
  Filled 2014-09-06: qty 2

## 2014-09-06 MED ORDER — METHYLPREDNISOLONE SODIUM SUCC 125 MG IJ SOLR
60.0000 mg | Freq: Once | INTRAMUSCULAR | Status: AC
  Administered 2014-09-06: 60 mg via INTRAVENOUS

## 2014-09-06 MED ORDER — ACETAMINOPHEN 325 MG PO TABS
650.0000 mg | ORAL_TABLET | Freq: Once | ORAL | Status: AC
  Administered 2014-09-06: 650 mg via ORAL

## 2014-09-06 MED ORDER — LACTATED RINGERS IV SOLN
96.0000 mg/m2 | Freq: Every day | INTRAVENOUS | Status: DC
  Administered 2014-09-06: 200 mg via INTRAVENOUS
  Filled 2014-09-06: qty 40

## 2014-09-06 NOTE — Progress Notes (Signed)
Hematology and Oncology Follow Up Visit  [name] [id number] [birth year] 69 y.o. 09/06/2014   Principle Diagnosis:   RAEB-2 (normal cytogenetics with mutated ASXL-1, TET2, U2AF1 genes)  Current Therapy:    Dose intense Vidaza-7 day cycles. To start today  Aranesp 300 g subcutaneous every week needed for hemoglobin less than 10     Interim History:  [name] is back for follow-up. We went ahead and did a bone marrow biopsy and aspirate on him. This was after his fourth cycle of Vidaza. The bone marrow report ([id number]) showed refractory anemia with excess blasts. He had 7% blasts. Everything looked pretty similar r to his last bone marrow test which was done prior to chemotherapy.  His cytogenetics were normal.  He was seen at [facility]. He saw Dr. Florene [name]. Dr. Florene [name] felt that his disease was quite aggressive and at she recommended a stem cell transplant. This did not "sit well" with Mr. and [name].  I told them that I thought Dr. Florene [name] made his recommendation because of the fact that he was still needing quite a bit of transfusion support. He was worried about the potential for iron overload.  Data [name] recommended the increased dose of Vidaza and to give it every 7 days in a four-week cycle. I'll leave this would be very reasonable.  [name] will need to have a Port-A-Cath.  He, otherwise, seems to be doing okay. He is feeling more tired. I'm sure that he probably is getting more anemic.  I'm just surprised that he is not yet responded to Aranesp. I think we're going to have to increase the intensity of the Aranesp at the about doing this weekly for right now.  We are also wants his iron studies. Back in June, his ferritin was 744 with an iron saturation 59%. This is slowly going up.   Overall, his performance status is ECOG 1.  Medications:  Current outpatient prescriptions:  .  acetaminophen (TYLENOL) 325 MG tablet, Take 325 mg by mouth every 6 (six)  hours as needed for moderate pain., Disp: , Rfl:  .  aspirin EC 81 MG tablet, Take 1 tablet (81 mg total) by mouth daily., Disp: , Rfl:  .  B Complex Vitamins (VITAMIN-B COMPLEX PO), Take by mouth daily., Disp: , Rfl:  .  clindamycin (CLEOCIN) 300 MG capsule, Take 2 capsules 60 minutes before dental procedure., Disp: 2 capsule, Rfl: 0 .  LORazepam (ATIVAN) 0.5 MG tablet, Take 1 tablet (0.5 mg total) by mouth every 6 (six) hours as needed (Nausea or vomiting)., Disp: 30 tablet, Rfl: 3 .  metoprolol tartrate (LOPRESSOR) 25 MG tablet, Take 0.5 tablets (12.5 mg total) by mouth 2 (two) times daily., Disp: 90 tablet, Rfl: 3 .  nitroGLYCERIN (NITROSTAT) 0.4 MG SL tablet, Place 1 tablet (0.4 mg total) under the tongue every 5 (five) minutes as needed for chest pain., Disp: 25 tablet, Rfl: 6 .  pantoprazole (PROTONIX) 40 MG tablet, Take 40 mg by mouth daily., Disp: , Rfl:  .  predniSONE (DELTASONE) 20 MG tablet, Take 2 pills a day for 3 days, then 2 pills a day for 3 days, then 1 pill a day for 3 days, then 1/2 pill a day for 5 days, Disp: 40 tablet, Rfl: 2 .  prochlorperazine (COMPAZINE) 10 MG tablet, Take 1 tablet (10 mg total) by mouth every 6 (six) hours as needed (Nausea or vomiting)., Disp: 30 tablet, Rfl: 1 .  simvastatin (ZOCOR) 20 MG tablet, Take  1 tablet (20 mg total) by mouth at bedtime., Disp: 30 tablet, Rfl: 5 .  temazepam (RESTORIL) 15 MG capsule, TAKE 1 CAPSULE BY MOUTH AT BEDTIME AS NEEDED, Disp: 30 capsule, Rfl: 0  Allergies:  Allergies  Allergen Reactions  . Vicodin [Hydrocodone-Acetaminophen] Itching  . Cephalexin Other (See Comments)    Nausea and stomach cramps  . Phenergan [Promethazine Hcl] Other (See Comments)    "restless legs"  . Percocet [Oxycodone-Acetaminophen] Itching    Past Medical History, Surgical history, Social history, and Family History were reviewed and updated.  Review of Systems: As above  Physical Exam:  vitals were not taken for this visit.  Wt  Readings from Last 3 Encounters:  09/02/14 195 lb (88.451 kg)  08/15/14 194 lb (87.998 kg)  08/09/14 185 lb (83.915 kg)     Well-developed and well-nourished white gentleman in no obvious distress. Head and neck exam shows no ocular or oral lesions. There are no palpable cervical or supraclavicular lymph does. Lungs are clear. Cardiac exam regular rate and rhythm with no murmurs, rubs or bruits. Abdomen is soft. Has good bowel sounds. There is no fluid wave. There is no palpable liver or spleen tip. Back exam shows no tenderness over the spine, ribs or hips. Extremities shows no clubbing, cyanosis or edema. Skin exam shows no rashes, ecchymoses or petechia. Neurological exam is nonfocal.  Lab Results  Component Value Date   WBC 3.3* 09/05/2014   HGB 6.6* 09/05/2014   HCT 19.8* 09/05/2014   MCV 90 09/05/2014   PLT 50* 09/05/2014     Chemistry      Component Value Date/Time   NA 139 09/02/2014 0945   NA 135 07/11/2014 1405   K 4.1 09/02/2014 0945   K 3.4 07/11/2014 1405   CL 105 09/02/2014 0945   CL 102 07/11/2014 1405   CO2 23 09/02/2014 0945   CO2 27 07/11/2014 1405   BUN 21* 09/02/2014 0945   BUN 30* 07/11/2014 1405   CREATININE 1.18 09/02/2014 0945   CREATININE 1.4* 07/11/2014 1405      Component Value Date/Time   CALCIUM 9.4 09/02/2014 0945   CALCIUM 8.8 07/11/2014 1405   ALKPHOS 60 07/11/2014 1405   ALKPHOS 59 06/21/2014 1105   AST 22 07/11/2014 1405   AST 15 06/21/2014 1105   ALT 33 07/11/2014 1405   ALT 19 06/21/2014 1105   BILITOT 1.20 07/11/2014 1405   BILITOT 0.6 06/21/2014 1105         Impression and Plan: [name] is 68 year old gentleman with myelodysplasia. He has normal cytogenetics. We did do the NGS of his blood which showed some abnormalities. Again, these might indicate an increased risk of transformation but it is not definitive.  Overall, I think he does have stable disease. Hopefully, the increase of the Vidaza dose will help.  We will also  have to transfuse him.  We will need to see about getting a Port-A-Cath placed. He does have thrombocytopenia so we will have to watch out with that.  I will see about the weekly Aranesp.  We will have to follow his blood work every week. I spent about 45 minutes with he and his wife today. This certainly is a complicated situation.    Darrell Napoleon, MD 8/23/20167:32 AM

## 2014-09-06 NOTE — Patient Instructions (Signed)
Azacitidine suspension for injection (subcutaneous use) What is this medicine? AZACITIDINE (ay [place]) is a chemotherapy drug. This medicine reduces the growth of cancer cells and can suppress the immune system. It is used for treating myelodysplastic syndrome or some types of leukemia. This medicine may be used for other purposes; ask your health care provider or pharmacist if you have questions. COMMON BRAND NAME(S): Vidaza What should I tell my health care provider before I take this medicine? They need to know if you have any of these conditions: -infection (especially a virus infection such as chickenpox, cold sores, or herpes) -kidney disease -liver disease -liver tumors -an unusual or allergic reaction to azacitidine, mannitol, other medicines, foods, dyes, or preservatives -pregnant or trying to get pregnant -breast-feeding How should I use this medicine? This medicine is for injection under the skin. It is administered in a hospital or clinic by a specially trained health care professional. Talk to your pediatrician regarding the use of this medicine in children. While this drug may be prescribed for selected conditions, precautions do apply. Overdosage: If you think you have taken too much of this medicine contact a poison control center or emergency room at once. NOTE: This medicine is only for you. Do not share this medicine with others. What if I miss a dose? It is important not to miss your dose. Call your doctor or health care professional if you are unable to keep an appointment. What may interact with this medicine? -vaccines Talk to your doctor or health care professional before taking any of these medicines: -acetaminophen -aspirin -ibuprofen -ketoprofen -naproxen This list may not describe all possible interactions. Give your health care provider a list of all the medicines, herbs, non-prescription drugs, or dietary supplements you use. Also tell them if you  smoke, drink alcohol, or use illegal drugs. Some items may interact with your medicine. What should I watch for while using this medicine? Visit your doctor for checks on your progress. This drug may make you feel generally unwell. This is not uncommon, as chemotherapy can affect healthy cells as well as cancer cells. Report any side effects. Continue your course of treatment even though you feel ill unless your doctor tells you to stop. In some cases, you may be given additional medicines to help with side effects. Follow all directions for their use. Call your doctor or health care professional for advice if you get a fever, chills or sore throat, or other symptoms of a cold or flu. Do not treat yourself. This drug decreases your body's ability to fight infections. Try to avoid being around people who are sick. This medicine may increase your risk to bruise or bleed. Call your doctor or health care professional if you notice any unusual bleeding. Be careful brushing and flossing your teeth or using a toothpick because you may get an infection or bleed more easily. If you have any dental work done, tell your dentist you are receiving this medicine. Avoid taking products that contain aspirin, acetaminophen, ibuprofen, naproxen, or ketoprofen unless instructed by your doctor. These medicines may hide a fever. Do not have any vaccinations without your doctor's approval and avoid anyone who has recently had oral polio vaccine. Do not become pregnant while taking this medicine. Women should inform their doctor if they wish to become pregnant or think they might be pregnant. There is a potential for serious side effects to an unborn child. Talk to your health care professional or pharmacist for more information.  Do not breast-feed an infant while taking this medicine. If you are a man, you should not father a child while receiving treatment. What side effects may I notice from receiving this medicine? Side  effects that you should report to your doctor or health care professional as soon as possible: -allergic reactions like skin rash, itching or hives, swelling of the face, lips, or tongue -low blood counts - this medicine may decrease the number of white blood cells, red blood cells and platelets. You may be at increased risk for infections and bleeding. -signs of infection - fever or chills, cough, sore throat, pain or difficulty passing urine -signs of decreased platelets or bleeding - bruising, pinpoint red spots on the skin, black, tarry stools, blood in the urine -signs of decreased red blood cells - unusually weak or tired, fainting spells, lightheadedness -reactions at the injection site including redness, pain, itching, or bruising -breathing problems -changes in vision -fever -mouth sores -stomach pain -vomiting Side effects that usually do not require medical attention (report to your doctor or health care professional if they continue or are bothersome): -constipation -diarrhea -loss of appetite -nausea -pain or redness at the injection site -weak or tired This list may not describe all possible side effects. Call your doctor for medical advice about side effects. You may report side effects to FDA at 1-800-FDA-1088. Where should I keep my medicine? This drug is given in a hospital or clinic and will not be stored at home. NOTE: This sheet is a summary. It may not cover all possible information. If you have questions about this medicine, talk to your doctor, pharmacist, or health care provider.  2015, [employer]. (2007-03-26 11:04:07)

## 2014-09-07 ENCOUNTER — Ambulatory Visit (HOSPITAL_BASED_OUTPATIENT_CLINIC_OR_DEPARTMENT_OTHER): Payer: Medicare Other

## 2014-09-07 VITALS — BP 117/62 | HR 50 | Temp 98.2°F | Resp 18

## 2014-09-07 DIAGNOSIS — D46Z Other myelodysplastic syndromes: Secondary | ICD-10-CM

## 2014-09-07 DIAGNOSIS — D469 Myelodysplastic syndrome, unspecified: Secondary | ICD-10-CM | POA: Diagnosis not present

## 2014-09-07 DIAGNOSIS — D61818 Other pancytopenia: Secondary | ICD-10-CM

## 2014-09-07 DIAGNOSIS — Z5111 Encounter for antineoplastic chemotherapy: Secondary | ICD-10-CM | POA: Diagnosis present

## 2014-09-07 LAB — TYPE AND SCREEN
ABO/RH(D): A POS
ANTIBODY SCREEN: NEGATIVE
UNIT DIVISION: 0
UNIT DIVISION: 0

## 2014-09-07 MED ORDER — SODIUM CHLORIDE 0.9 % IV SOLN
Freq: Once | INTRAVENOUS | Status: AC
  Administered 2014-09-07: 12:00:00 via INTRAVENOUS
  Filled 2014-09-07: qty 4

## 2014-09-07 MED ORDER — SODIUM CHLORIDE 0.9 % IV SOLN
Freq: Once | INTRAVENOUS | Status: AC
  Administered 2014-09-07: 11:00:00 via INTRAVENOUS

## 2014-09-07 MED ORDER — LACTATED RINGERS IV SOLN
96.0000 mg/m2 | Freq: Every day | INTRAVENOUS | Status: DC
  Administered 2014-09-07: 200 mg via INTRAVENOUS
  Filled 2014-09-07: qty 40

## 2014-09-07 MED ORDER — METHYLPREDNISOLONE SODIUM SUCC 125 MG IJ SOLR
INTRAMUSCULAR | Status: AC
  Filled 2014-09-07: qty 2

## 2014-09-07 MED ORDER — FAMOTIDINE IN NACL 20-0.9 MG/50ML-% IV SOLN
INTRAVENOUS | Status: AC
  Filled 2014-09-07: qty 100

## 2014-09-07 MED ORDER — FAMOTIDINE IN NACL 20-0.9 MG/50ML-% IV SOLN
40.0000 mg | Freq: Once | INTRAVENOUS | Status: AC
  Administered 2014-09-07: 40 mg via INTRAVENOUS

## 2014-09-07 MED ORDER — METHYLPREDNISOLONE SODIUM SUCC 125 MG IJ SOLR
60.0000 mg | Freq: Once | INTRAMUSCULAR | Status: AC
  Administered 2014-09-07: 60 mg via INTRAVENOUS

## 2014-09-07 NOTE — Patient Instructions (Signed)
Azacitidine suspension for injection (subcutaneous use) What is this medicine? AZACITIDINE (ay [place]) is a chemotherapy drug. This medicine reduces the growth of cancer cells and can suppress the immune system. It is used for treating myelodysplastic syndrome or some types of leukemia. This medicine may be used for other purposes; ask your health care provider or pharmacist if you have questions. COMMON BRAND NAME(S): Vidaza What should I tell my health care provider before I take this medicine? They need to know if you have any of these conditions: -infection (especially a virus infection such as chickenpox, cold sores, or herpes) -kidney disease -liver disease -liver tumors -an unusual or allergic reaction to azacitidine, mannitol, other medicines, foods, dyes, or preservatives -pregnant or trying to get pregnant -breast-feeding How should I use this medicine? This medicine is for injection under the skin. It is administered in a hospital or clinic by a specially trained health care professional. Talk to your pediatrician regarding the use of this medicine in children. While this drug may be prescribed for selected conditions, precautions do apply. Overdosage: If you think you have taken too much of this medicine contact a poison control center or emergency room at once. NOTE: This medicine is only for you. Do not share this medicine with others. What if I miss a dose? It is important not to miss your dose. Call your doctor or health care professional if you are unable to keep an appointment. What may interact with this medicine? -vaccines Talk to your doctor or health care professional before taking any of these medicines: -acetaminophen -aspirin -ibuprofen -ketoprofen -naproxen This list may not describe all possible interactions. Give your health care provider a list of all the medicines, herbs, non-prescription drugs, or dietary supplements you use. Also tell them if you  smoke, drink alcohol, or use illegal drugs. Some items may interact with your medicine. What should I watch for while using this medicine? Visit your doctor for checks on your progress. This drug may make you feel generally unwell. This is not uncommon, as chemotherapy can affect healthy cells as well as cancer cells. Report any side effects. Continue your course of treatment even though you feel ill unless your doctor tells you to stop. In some cases, you may be given additional medicines to help with side effects. Follow all directions for their use. Call your doctor or health care professional for advice if you get a fever, chills or sore throat, or other symptoms of a cold or flu. Do not treat yourself. This drug decreases your body's ability to fight infections. Try to avoid being around people who are sick. This medicine may increase your risk to bruise or bleed. Call your doctor or health care professional if you notice any unusual bleeding. Be careful brushing and flossing your teeth or using a toothpick because you may get an infection or bleed more easily. If you have any dental work done, tell your dentist you are receiving this medicine. Avoid taking products that contain aspirin, acetaminophen, ibuprofen, naproxen, or ketoprofen unless instructed by your doctor. These medicines may hide a fever. Do not have any vaccinations without your doctor's approval and avoid anyone who has recently had oral polio vaccine. Do not become pregnant while taking this medicine. Women should inform their doctor if they wish to become pregnant or think they might be pregnant. There is a potential for serious side effects to an unborn child. Talk to your health care professional or pharmacist for more information.  Do not breast-feed an infant while taking this medicine. If you are a man, you should not father a child while receiving treatment. What side effects may I notice from receiving this medicine? Side  effects that you should report to your doctor or health care professional as soon as possible: -allergic reactions like skin rash, itching or hives, swelling of the face, lips, or tongue -low blood counts - this medicine may decrease the number of white blood cells, red blood cells and platelets. You may be at increased risk for infections and bleeding. -signs of infection - fever or chills, cough, sore throat, pain or difficulty passing urine -signs of decreased platelets or bleeding - bruising, pinpoint red spots on the skin, black, tarry stools, blood in the urine -signs of decreased red blood cells - unusually weak or tired, fainting spells, lightheadedness -reactions at the injection site including redness, pain, itching, or bruising -breathing problems -changes in vision -fever -mouth sores -stomach pain -vomiting Side effects that usually do not require medical attention (report to your doctor or health care professional if they continue or are bothersome): -constipation -diarrhea -loss of appetite -nausea -pain or redness at the injection site -weak or tired This list may not describe all possible side effects. Call your doctor for medical advice about side effects. You may report side effects to FDA at 1-800-FDA-1088. Where should I keep my medicine? This drug is given in a hospital or clinic and will not be stored at home. NOTE: This sheet is a summary. It may not cover all possible information. If you have questions about this medicine, talk to your doctor, pharmacist, or health care provider.  2015, [employer]. (2007-03-26 11:04:07)

## 2014-09-08 ENCOUNTER — Ambulatory Visit (HOSPITAL_BASED_OUTPATIENT_CLINIC_OR_DEPARTMENT_OTHER): Payer: Medicare Other

## 2014-09-08 VITALS — BP 105/64 | HR 71 | Temp 98.2°F | Resp 18

## 2014-09-08 DIAGNOSIS — D46Z Other myelodysplastic syndromes: Secondary | ICD-10-CM

## 2014-09-08 DIAGNOSIS — Z5111 Encounter for antineoplastic chemotherapy: Secondary | ICD-10-CM

## 2014-09-08 DIAGNOSIS — D469 Myelodysplastic syndrome, unspecified: Secondary | ICD-10-CM | POA: Diagnosis not present

## 2014-09-08 DIAGNOSIS — D61818 Other pancytopenia: Secondary | ICD-10-CM

## 2014-09-08 MED ORDER — METHYLPREDNISOLONE SODIUM SUCC 125 MG IJ SOLR
INTRAMUSCULAR | Status: AC
  Filled 2014-09-08: qty 2

## 2014-09-08 MED ORDER — SODIUM CHLORIDE 0.9 % IV SOLN
Freq: Once | INTRAVENOUS | Status: AC
  Administered 2014-09-08: 12:00:00 via INTRAVENOUS
  Filled 2014-09-08: qty 4

## 2014-09-08 MED ORDER — FAMOTIDINE IN NACL 20-0.9 MG/50ML-% IV SOLN
INTRAVENOUS | Status: AC
  Filled 2014-09-08: qty 50

## 2014-09-08 MED ORDER — LACTATED RINGERS IV SOLN
95.0000 mg/m2 | Freq: Every day | INTRAVENOUS | Status: DC
  Administered 2014-09-08: 200 mg via INTRAVENOUS
  Filled 2014-09-08: qty 40

## 2014-09-08 MED ORDER — SODIUM CHLORIDE 0.9 % IV SOLN
Freq: Once | INTRAVENOUS | Status: AC
  Administered 2014-09-08: 11:00:00 via INTRAVENOUS

## 2014-09-08 MED ORDER — FAMOTIDINE IN NACL 20-0.9 MG/50ML-% IV SOLN
40.0000 mg | Freq: Two times a day (BID) | INTRAVENOUS | Status: DC
  Administered 2014-09-08: 40 mg via INTRAVENOUS

## 2014-09-08 MED ORDER — METHYLPREDNISOLONE SODIUM SUCC 125 MG IJ SOLR
60.0000 mg | Freq: Once | INTRAMUSCULAR | Status: AC
  Administered 2014-09-08: 60 mg via INTRAVENOUS

## 2014-09-08 NOTE — Patient Instructions (Signed)
Azacitidine suspension for injection (subcutaneous use) What is this medicine? AZACITIDINE (ay [place]) is a chemotherapy drug. This medicine reduces the growth of cancer cells and can suppress the immune system. It is used for treating myelodysplastic syndrome or some types of leukemia. This medicine may be used for other purposes; ask your health care provider or pharmacist if you have questions. COMMON BRAND NAME(S): Vidaza What should I tell my health care provider before I take this medicine? They need to know if you have any of these conditions: -infection (especially a virus infection such as chickenpox, cold sores, or herpes) -kidney disease -liver disease -liver tumors -an unusual or allergic reaction to azacitidine, mannitol, other medicines, foods, dyes, or preservatives -pregnant or trying to get pregnant -breast-feeding How should I use this medicine? This medicine is for injection under the skin. It is administered in a hospital or clinic by a specially trained health care professional. Talk to your pediatrician regarding the use of this medicine in children. While this drug may be prescribed for selected conditions, precautions do apply. Overdosage: If you think you have taken too much of this medicine contact a poison control center or emergency room at once. NOTE: This medicine is only for you. Do not share this medicine with others. What if I miss a dose? It is important not to miss your dose. Call your doctor or health care professional if you are unable to keep an appointment. What may interact with this medicine? -vaccines Talk to your doctor or health care professional before taking any of these medicines: -acetaminophen -aspirin -ibuprofen -ketoprofen -naproxen This list may not describe all possible interactions. Give your health care provider a list of all the medicines, herbs, non-prescription drugs, or dietary supplements you use. Also tell them if you  smoke, drink alcohol, or use illegal drugs. Some items may interact with your medicine. What should I watch for while using this medicine? Visit your doctor for checks on your progress. This drug may make you feel generally unwell. This is not uncommon, as chemotherapy can affect healthy cells as well as cancer cells. Report any side effects. Continue your course of treatment even though you feel ill unless your doctor tells you to stop. In some cases, you may be given additional medicines to help with side effects. Follow all directions for their use. Call your doctor or health care professional for advice if you get a fever, chills or sore throat, or other symptoms of a cold or flu. Do not treat yourself. This drug decreases your body's ability to fight infections. Try to avoid being around people who are sick. This medicine may increase your risk to bruise or bleed. Call your doctor or health care professional if you notice any unusual bleeding. Be careful brushing and flossing your teeth or using a toothpick because you may get an infection or bleed more easily. If you have any dental work done, tell your dentist you are receiving this medicine. Avoid taking products that contain aspirin, acetaminophen, ibuprofen, naproxen, or ketoprofen unless instructed by your doctor. These medicines may hide a fever. Do not have any vaccinations without your doctor's approval and avoid anyone who has recently had oral polio vaccine. Do not become pregnant while taking this medicine. Women should inform their doctor if they wish to become pregnant or think they might be pregnant. There is a potential for serious side effects to an unborn child. Talk to your health care professional or pharmacist for more information.  Do not breast-feed an infant while taking this medicine. If you are a man, you should not father a child while receiving treatment. What side effects may I notice from receiving this medicine? Side  effects that you should report to your doctor or health care professional as soon as possible: -allergic reactions like skin rash, itching or hives, swelling of the face, lips, or tongue -low blood counts - this medicine may decrease the number of white blood cells, red blood cells and platelets. You may be at increased risk for infections and bleeding. -signs of infection - fever or chills, cough, sore throat, pain or difficulty passing urine -signs of decreased platelets or bleeding - bruising, pinpoint red spots on the skin, black, tarry stools, blood in the urine -signs of decreased red blood cells - unusually weak or tired, fainting spells, lightheadedness -reactions at the injection site including redness, pain, itching, or bruising -breathing problems -changes in vision -fever -mouth sores -stomach pain -vomiting Side effects that usually do not require medical attention (report to your doctor or health care professional if they continue or are bothersome): -constipation -diarrhea -loss of appetite -nausea -pain or redness at the injection site -weak or tired This list may not describe all possible side effects. Call your doctor for medical advice about side effects. You may report side effects to FDA at 1-800-FDA-1088. Where should I keep my medicine? This drug is given in a hospital or clinic and will not be stored at home. NOTE: This sheet is a summary. It may not cover all possible information. If you have questions about this medicine, talk to your doctor, pharmacist, or health care provider.  2015, [employer]. (2007-03-26 11:04:07)

## 2014-09-09 ENCOUNTER — Ambulatory Visit (HOSPITAL_BASED_OUTPATIENT_CLINIC_OR_DEPARTMENT_OTHER): Payer: Medicare Other

## 2014-09-09 ENCOUNTER — Ambulatory Visit: Payer: Medicare Other

## 2014-09-09 ENCOUNTER — Other Ambulatory Visit: Payer: Self-pay | Admitting: *Deleted

## 2014-09-09 VITALS — BP 142/68 | HR 47 | Temp 97.8°F

## 2014-09-09 DIAGNOSIS — D61818 Other pancytopenia: Secondary | ICD-10-CM

## 2014-09-09 DIAGNOSIS — Z5111 Encounter for antineoplastic chemotherapy: Secondary | ICD-10-CM | POA: Diagnosis present

## 2014-09-09 DIAGNOSIS — D469 Myelodysplastic syndrome, unspecified: Secondary | ICD-10-CM

## 2014-09-09 DIAGNOSIS — D46Z Other myelodysplastic syndromes: Secondary | ICD-10-CM

## 2014-09-09 MED ORDER — ONDANSETRON HCL 40 MG/20ML IJ SOLN
Freq: Once | INTRAMUSCULAR | Status: AC
  Administered 2014-09-09: 11:00:00 via INTRAVENOUS
  Filled 2014-09-09: qty 4

## 2014-09-09 MED ORDER — SODIUM CHLORIDE 0.9 % IV SOLN
Freq: Once | INTRAVENOUS | Status: AC
  Administered 2014-09-09: 10:00:00 via INTRAVENOUS

## 2014-09-09 MED ORDER — METHYLPREDNISOLONE SODIUM SUCC 125 MG IJ SOLR
60.0000 mg | Freq: Once | INTRAMUSCULAR | Status: AC
  Administered 2014-09-09: 60 mg via INTRAVENOUS

## 2014-09-09 MED ORDER — FAMOTIDINE IN NACL 20-0.9 MG/50ML-% IV SOLN
40.0000 mg | Freq: Two times a day (BID) | INTRAVENOUS | Status: DC
  Administered 2014-09-09: 40 mg via INTRAVENOUS

## 2014-09-09 MED ORDER — FAMOTIDINE IN NACL 20-0.9 MG/50ML-% IV SOLN
INTRAVENOUS | Status: AC
  Filled 2014-09-09: qty 50

## 2014-09-09 MED ORDER — LACTATED RINGERS IV SOLN
96.0000 mg/m2 | Freq: Every day | INTRAVENOUS | Status: DC
  Administered 2014-09-09: 200 mg via INTRAVENOUS
  Filled 2014-09-09: qty 40

## 2014-09-09 MED ORDER — METHYLPREDNISOLONE SODIUM SUCC 125 MG IJ SOLR
INTRAMUSCULAR | Status: AC
  Filled 2014-09-09: qty 2

## 2014-09-09 NOTE — Patient Instructions (Signed)
[facility] Discharge Instructions for Patients Receiving Chemotherapy  Today you received the following chemotherapy agents Vidaza  To help prevent nausea and vomiting after your treatment, we encourage you to take your nausea medication as prescribed.    If you develop nausea and vomiting that is not controlled by your nausea medication, call the clinic.   BELOW ARE SYMPTOMS THAT SHOULD BE REPORTED IMMEDIATELY:  *FEVER GREATER THAN 100.5 F  *CHILLS WITH OR WITHOUT FEVER  NAUSEA AND VOMITING THAT IS NOT CONTROLLED WITH YOUR NAUSEA MEDICATION  *UNUSUAL SHORTNESS OF BREATH  *UNUSUAL BRUISING OR BLEEDING  TENDERNESS IN MOUTH AND THROAT WITH OR WITHOUT PRESENCE OF ULCERS  *URINARY PROBLEMS  *BOWEL PROBLEMS  UNUSUAL RASH Items with * indicate a potential emergency and should be followed up as soon as possible.  Feel free to call the clinic you have any questions or concerns. The clinic phone number is [phone].  Please show the CHEMO ALERT CARD at check-in to the Emergency Department and triage nurse.   

## 2014-09-12 ENCOUNTER — Ambulatory Visit (HOSPITAL_BASED_OUTPATIENT_CLINIC_OR_DEPARTMENT_OTHER): Payer: Medicare Other

## 2014-09-12 ENCOUNTER — Other Ambulatory Visit (HOSPITAL_BASED_OUTPATIENT_CLINIC_OR_DEPARTMENT_OTHER): Payer: Medicare Other

## 2014-09-12 VITALS — BP 131/71 | HR 67 | Temp 98.1°F | Resp 18

## 2014-09-12 DIAGNOSIS — D46Z Other myelodysplastic syndromes: Secondary | ICD-10-CM

## 2014-09-12 DIAGNOSIS — Z5111 Encounter for antineoplastic chemotherapy: Secondary | ICD-10-CM | POA: Diagnosis present

## 2014-09-12 DIAGNOSIS — D469 Myelodysplastic syndrome, unspecified: Secondary | ICD-10-CM

## 2014-09-12 DIAGNOSIS — D61818 Other pancytopenia: Secondary | ICD-10-CM

## 2014-09-12 LAB — CBC WITH DIFFERENTIAL (CANCER CENTER ONLY)
HEMATOCRIT: 24.6 % — AB (ref 38.7–49.9)
HEMOGLOBIN: 8.3 g/dL — AB (ref 13.0–17.1)
MCH: 29.9 pg (ref 28.0–33.4)
MCHC: 33.7 g/dL (ref 32.0–35.9)
MCV: 89 fL (ref 82–98)
Platelets: 37 10*3/uL — ABNORMAL LOW (ref 145–400)
RBC: 2.78 10*6/uL — ABNORMAL LOW (ref 4.20–5.70)
RDW: 14.9 % (ref 11.1–15.7)
WBC: 3.8 10*3/uL — ABNORMAL LOW (ref 4.0–10.0)

## 2014-09-12 LAB — MANUAL DIFFERENTIAL (CHCC SATELLITE)
ALC: 0.7 10*3/uL — AB (ref 0.9–3.3)
ANC (CHCC HP manual diff): 2.6 10*3/uL (ref 1.5–6.5)
LYMPH: 19 % (ref 14–48)
MONO: 14 % — AB (ref 0–13)
PLATELET MORPHOLOGY: NORMAL
PLT EST ~~LOC~~: DECREASED
SEG: 67 % (ref 40–75)

## 2014-09-12 LAB — COMPREHENSIVE METABOLIC PANEL (CC13)
ALBUMIN: 3.6 g/dL (ref 3.5–5.0)
ALK PHOS: 79 U/L (ref 40–150)
ALT: 33 U/L (ref 0–55)
AST: 13 U/L (ref 5–34)
Anion Gap: 9 mEq/L (ref 3–11)
BILIRUBIN TOTAL: 0.71 mg/dL (ref 0.20–1.20)
BUN: 28 mg/dL — AB (ref 7.0–26.0)
CO2: 21 mEq/L — ABNORMAL LOW (ref 22–29)
CREATININE: 1.4 mg/dL — AB (ref 0.7–1.3)
Calcium: 8.9 mg/dL (ref 8.4–10.4)
Chloride: 105 mEq/L (ref 98–109)
EGFR: 50 mL/min/{1.73_m2} — ABNORMAL LOW (ref >90)
GLUCOSE: 181 mg/dL — AB (ref 70–140)
Potassium: 3.7 mEq/L (ref 3.5–5.1)
SODIUM: 136 meq/L (ref 136–145)
TOTAL PROTEIN: 7.1 g/dL (ref 6.4–8.3)

## 2014-09-12 LAB — HOLD TUBE, BLOOD BANK - CHCC SATELLITE

## 2014-09-12 MED ORDER — METHYLPREDNISOLONE SODIUM SUCC 125 MG IJ SOLR
60.0000 mg | Freq: Once | INTRAMUSCULAR | Status: AC
  Administered 2014-09-12: 60 mg via INTRAVENOUS

## 2014-09-12 MED ORDER — LACTATED RINGERS IV SOLN
95.0000 mg/m2 | Freq: Every day | INTRAVENOUS | Status: DC
  Administered 2014-09-12: 200 mg via INTRAVENOUS
  Filled 2014-09-12: qty 40

## 2014-09-12 MED ORDER — DARBEPOETIN ALFA 500 MCG/ML IJ SOSY
500.0000 ug | PREFILLED_SYRINGE | Freq: Once | INTRAMUSCULAR | Status: AC
  Administered 2014-09-12: 500 ug via SUBCUTANEOUS

## 2014-09-12 MED ORDER — FAMOTIDINE IN NACL 20-0.9 MG/50ML-% IV SOLN
40.0000 mg | Freq: Once | INTRAVENOUS | Status: AC
  Administered 2014-09-12: 40 mg via INTRAVENOUS

## 2014-09-12 MED ORDER — SODIUM CHLORIDE 0.9 % IV SOLN
Freq: Once | INTRAVENOUS | Status: AC
  Administered 2014-09-12: 12:00:00 via INTRAVENOUS

## 2014-09-12 MED ORDER — METHYLPREDNISOLONE SODIUM SUCC 125 MG IJ SOLR
INTRAMUSCULAR | Status: AC
  Filled 2014-09-12: qty 2

## 2014-09-12 MED ORDER — DARBEPOETIN ALFA 500 MCG/ML IJ SOSY
PREFILLED_SYRINGE | INTRAMUSCULAR | Status: AC
  Filled 2014-09-12: qty 1

## 2014-09-12 MED ORDER — FAMOTIDINE IN NACL 20-0.9 MG/50ML-% IV SOLN
INTRAVENOUS | Status: AC
  Filled 2014-09-12: qty 100

## 2014-09-12 MED ORDER — SODIUM CHLORIDE 0.9 % IV SOLN
Freq: Once | INTRAVENOUS | Status: AC
  Administered 2014-09-12: 14:00:00 via INTRAVENOUS
  Filled 2014-09-12: qty 4

## 2014-09-12 NOTE — Patient Instructions (Signed)
[facility] Discharge Instructions for Patients Receiving Chemotherapy  Today you received the following chemotherapy agents Vidaza  To help prevent nausea and vomiting after your treatment, we encourage you to take your nausea medication   If you develop nausea and vomiting that is not controlled by your nausea medication, call the clinic.   BELOW ARE SYMPTOMS THAT SHOULD BE REPORTED IMMEDIATELY:  *FEVER GREATER THAN 100.5 F  *CHILLS WITH OR WITHOUT FEVER  NAUSEA AND VOMITING THAT IS NOT CONTROLLED WITH YOUR NAUSEA MEDICATION  *UNUSUAL SHORTNESS OF BREATH  *UNUSUAL BRUISING OR BLEEDING  TENDERNESS IN MOUTH AND THROAT WITH OR WITHOUT PRESENCE OF ULCERS  *URINARY PROBLEMS  *BOWEL PROBLEMS  UNUSUAL RASH Items with * indicate a potential emergency and should be followed up as soon as possible.  Feel free to call the clinic you have any questions or concerns. The clinic phone number is [phone].  Please show the CHEMO ALERT CARD at check-in to the Emergency Department and triage nurse.   

## 2014-09-13 ENCOUNTER — Other Ambulatory Visit: Payer: Self-pay | Admitting: Emergency Medicine

## 2014-09-13 ENCOUNTER — Other Ambulatory Visit: Payer: Self-pay | Admitting: Radiology

## 2014-09-13 ENCOUNTER — Ambulatory Visit (HOSPITAL_BASED_OUTPATIENT_CLINIC_OR_DEPARTMENT_OTHER): Payer: Medicare Other

## 2014-09-13 VITALS — BP 139/72 | HR 65 | Temp 97.4°F | Resp 18

## 2014-09-13 DIAGNOSIS — Z5111 Encounter for antineoplastic chemotherapy: Secondary | ICD-10-CM

## 2014-09-13 DIAGNOSIS — D61818 Other pancytopenia: Secondary | ICD-10-CM | POA: Diagnosis not present

## 2014-09-13 DIAGNOSIS — D46Z Other myelodysplastic syndromes: Secondary | ICD-10-CM | POA: Diagnosis present

## 2014-09-13 MED ORDER — SODIUM CHLORIDE 0.9 % IV SOLN
Freq: Once | INTRAVENOUS | Status: AC
  Administered 2014-09-13: 12:00:00 via INTRAVENOUS
  Filled 2014-09-13: qty 4

## 2014-09-13 MED ORDER — METHYLPREDNISOLONE SODIUM SUCC 125 MG IJ SOLR
60.0000 mg | Freq: Once | INTRAMUSCULAR | Status: AC
  Administered 2014-09-13: 60 mg via INTRAVENOUS

## 2014-09-13 MED ORDER — LACTATED RINGERS IV SOLN
96.0000 mg/m2 | Freq: Every day | INTRAVENOUS | Status: DC
  Administered 2014-09-13: 200 mg via INTRAVENOUS
  Filled 2014-09-13: qty 40

## 2014-09-13 MED ORDER — FAMOTIDINE IN NACL 20-0.9 MG/50ML-% IV SOLN
40.0000 mg | Freq: Once | INTRAVENOUS | Status: AC
  Administered 2014-09-13: 40 mg via INTRAVENOUS

## 2014-09-13 MED ORDER — FAMOTIDINE IN NACL 20-0.9 MG/50ML-% IV SOLN
INTRAVENOUS | Status: AC
  Filled 2014-09-13: qty 100

## 2014-09-13 MED ORDER — SODIUM CHLORIDE 0.9 % IV SOLN
Freq: Once | INTRAVENOUS | Status: AC
  Administered 2014-09-13: 11:00:00 via INTRAVENOUS

## 2014-09-13 MED ORDER — LIDOCAINE-PRILOCAINE 2.5-2.5 % EX CREA: 1.0000 "application " | TOPICAL_CREAM | CUTANEOUS | Status: DC | PRN

## 2014-09-13 MED ORDER — METHYLPREDNISOLONE SODIUM SUCC 125 MG IJ SOLR
INTRAMUSCULAR | Status: AC
  Filled 2014-09-13: qty 2

## 2014-09-13 NOTE — Patient Instructions (Signed)
[facility] Discharge Instructions for Patients Receiving Chemotherapy  Today you received the following chemotherapy agents Vidaza  To help prevent nausea and vomiting after your treatment, we encourage you to take your nausea medication   If you develop nausea and vomiting that is not controlled by your nausea medication, call the clinic.   BELOW ARE SYMPTOMS THAT SHOULD BE REPORTED IMMEDIATELY:  *FEVER GREATER THAN 100.5 F  *CHILLS WITH OR WITHOUT FEVER  NAUSEA AND VOMITING THAT IS NOT CONTROLLED WITH YOUR NAUSEA MEDICATION  *UNUSUAL SHORTNESS OF BREATH  *UNUSUAL BRUISING OR BLEEDING  TENDERNESS IN MOUTH AND THROAT WITH OR WITHOUT PRESENCE OF ULCERS  *URINARY PROBLEMS  *BOWEL PROBLEMS  UNUSUAL RASH Items with * indicate a potential emergency and should be followed up as soon as possible.  Feel free to call the clinic you have any questions or concerns. The clinic phone number is [phone].  Please show the CHEMO ALERT CARD at check-in to the Emergency Department and triage nurse.   

## 2014-09-14 ENCOUNTER — Emergency Department (HOSPITAL_COMMUNITY): Payer: Medicare Other

## 2014-09-14 ENCOUNTER — Encounter (HOSPITAL_COMMUNITY): Payer: Self-pay | Admitting: Emergency Medicine

## 2014-09-14 ENCOUNTER — Other Ambulatory Visit (HOSPITAL_COMMUNITY): Payer: Self-pay

## 2014-09-14 ENCOUNTER — Telehealth: Payer: Self-pay | Admitting: *Deleted

## 2014-09-14 ENCOUNTER — Inpatient Hospital Stay (HOSPITAL_COMMUNITY): Admission: RE | Admit: 2014-09-14 | Payer: Medicare Other | Source: Ambulatory Visit

## 2014-09-14 ENCOUNTER — Other Ambulatory Visit: Payer: Self-pay

## 2014-09-14 ENCOUNTER — Inpatient Hospital Stay (HOSPITAL_COMMUNITY)
Admission: EM | Admit: 2014-09-14 | Discharge: 2014-09-20 | DRG: 167 | Disposition: A | Discharge status: Home / Self Care | Payer: Medicare Other | Attending: Internal Medicine | Admitting: Internal Medicine

## 2014-09-14 ENCOUNTER — Ambulatory Visit (HOSPITAL_COMMUNITY): Admission: RE | Admit: 2014-09-14 | Payer: Medicare Other | Source: Ambulatory Visit

## 2014-09-14 DIAGNOSIS — J9811 Atelectasis: Secondary | ICD-10-CM | POA: Diagnosis present

## 2014-09-14 DIAGNOSIS — Z7982 Long term (current) use of aspirin: Secondary | ICD-10-CM | POA: Diagnosis not present

## 2014-09-14 DIAGNOSIS — I82432 Acute embolism and thrombosis of left popliteal vein: Secondary | ICD-10-CM | POA: Diagnosis not present

## 2014-09-14 DIAGNOSIS — I252 Old myocardial infarction: Secondary | ICD-10-CM

## 2014-09-14 DIAGNOSIS — J930 Spontaneous tension pneumothorax: Secondary | ICD-10-CM | POA: Diagnosis present

## 2014-09-14 DIAGNOSIS — D6859 Other primary thrombophilia: Secondary | ICD-10-CM | POA: Diagnosis not present

## 2014-09-14 DIAGNOSIS — R079 Chest pain, unspecified: Secondary | ICD-10-CM

## 2014-09-14 DIAGNOSIS — J449 Chronic obstructive pulmonary disease, unspecified: Secondary | ICD-10-CM | POA: Diagnosis present

## 2014-09-14 DIAGNOSIS — D696 Thrombocytopenia, unspecified: Secondary | ICD-10-CM | POA: Diagnosis present

## 2014-09-14 DIAGNOSIS — Z881 Allergy status to other antibiotic agents status: Secondary | ICD-10-CM

## 2014-09-14 DIAGNOSIS — R0602 Shortness of breath: Secondary | ICD-10-CM | POA: Diagnosis present

## 2014-09-14 DIAGNOSIS — D4620 Refractory anemia with excess of blasts, unspecified: Secondary | ICD-10-CM | POA: Diagnosis not present

## 2014-09-14 DIAGNOSIS — R011 Cardiac murmur, unspecified: Secondary | ICD-10-CM | POA: Diagnosis present

## 2014-09-14 DIAGNOSIS — M1990 Unspecified osteoarthritis, unspecified site: Secondary | ICD-10-CM | POA: Diagnosis present

## 2014-09-14 DIAGNOSIS — N183 Chronic kidney disease, stage 3 (moderate): Secondary | ICD-10-CM | POA: Diagnosis present

## 2014-09-14 DIAGNOSIS — J439 Emphysema, unspecified: Secondary | ICD-10-CM

## 2014-09-14 DIAGNOSIS — E785 Hyperlipidemia, unspecified: Secondary | ICD-10-CM | POA: Diagnosis present

## 2014-09-14 DIAGNOSIS — Z885 Allergy status to narcotic agent status: Secondary | ICD-10-CM

## 2014-09-14 DIAGNOSIS — I82402 Acute embolism and thrombosis of unspecified deep veins of left lower extremity: Secondary | ICD-10-CM | POA: Diagnosis not present

## 2014-09-14 DIAGNOSIS — I714 Abdominal aortic aneurysm, without rupture: Secondary | ICD-10-CM | POA: Diagnosis present

## 2014-09-14 DIAGNOSIS — I129 Hypertensive chronic kidney disease with stage 1 through stage 4 chronic kidney disease, or unspecified chronic kidney disease: Secondary | ICD-10-CM | POA: Diagnosis present

## 2014-09-14 DIAGNOSIS — Z9842 Cataract extraction status, left eye: Secondary | ICD-10-CM | POA: Diagnosis not present

## 2014-09-14 DIAGNOSIS — Z955 Presence of coronary angioplasty implant and graft: Secondary | ICD-10-CM | POA: Diagnosis not present

## 2014-09-14 DIAGNOSIS — J9383 Other pneumothorax: Secondary | ICD-10-CM

## 2014-09-14 DIAGNOSIS — K219 Gastro-esophageal reflux disease without esophagitis: Secondary | ICD-10-CM | POA: Diagnosis present

## 2014-09-14 DIAGNOSIS — J9382 Other air leak: Secondary | ICD-10-CM | POA: Diagnosis not present

## 2014-09-14 DIAGNOSIS — R509 Fever, unspecified: Secondary | ICD-10-CM

## 2014-09-14 DIAGNOSIS — D46Z Other myelodysplastic syndromes: Secondary | ICD-10-CM | POA: Diagnosis not present

## 2014-09-14 DIAGNOSIS — Z79899 Other long term (current) drug therapy: Secondary | ICD-10-CM | POA: Diagnosis not present

## 2014-09-14 DIAGNOSIS — I2510 Atherosclerotic heart disease of native coronary artery without angina pectoris: Secondary | ICD-10-CM | POA: Diagnosis present

## 2014-09-14 DIAGNOSIS — D469 Myelodysplastic syndrome, unspecified: Secondary | ICD-10-CM | POA: Diagnosis present

## 2014-09-14 DIAGNOSIS — F17210 Nicotine dependence, cigarettes, uncomplicated: Secondary | ICD-10-CM | POA: Diagnosis present

## 2014-09-14 DIAGNOSIS — Z720 Tobacco use: Secondary | ICD-10-CM | POA: Diagnosis not present

## 2014-09-14 DIAGNOSIS — J939 Pneumothorax, unspecified: Secondary | ICD-10-CM | POA: Diagnosis not present

## 2014-09-14 DIAGNOSIS — Z4682 Encounter for fitting and adjustment of non-vascular catheter: Secondary | ICD-10-CM

## 2014-09-14 DIAGNOSIS — D649 Anemia, unspecified: Secondary | ICD-10-CM | POA: Diagnosis not present

## 2014-09-14 HISTORY — DX: Major depressive disorder, single episode, unspecified: F32.9

## 2014-09-14 HISTORY — DX: Depression, unspecified: F32.A

## 2014-09-14 HISTORY — DX: Acute myocardial infarction, unspecified: I21.9

## 2014-09-14 HISTORY — PX: CHEST TUBE INSERTION: SHX231

## 2014-09-14 HISTORY — DX: Other pneumothorax: J93.83

## 2014-09-14 LAB — BASIC METABOLIC PANEL
ANION GAP: 8 (ref 5–15)
BUN: 35 mg/dL — ABNORMAL HIGH (ref 6–20)
CHLORIDE: 104 mmol/L (ref 101–111)
CO2: 24 mmol/L (ref 22–32)
Calcium: 8.5 mg/dL — ABNORMAL LOW (ref 8.9–10.3)
Creatinine, Ser: 1.57 mg/dL — ABNORMAL HIGH (ref 0.61–1.24)
GFR calc non Af Amer: 44 mL/min — ABNORMAL LOW (ref >60)
GFR, EST AFRICAN AMERICAN: 51 mL/min — AB (ref >60)
GLUCOSE: 121 mg/dL — AB (ref 65–99)
POTASSIUM: 3.9 mmol/L (ref 3.5–5.1)
Sodium: 136 mmol/L (ref 135–145)

## 2014-09-14 LAB — CBC
HEMATOCRIT: 23.2 % — AB (ref 39.0–52.0)
HEMOGLOBIN: 7.6 g/dL — AB (ref 13.0–17.0)
MCH: 28.7 pg (ref 26.0–34.0)
MCHC: 32.8 g/dL (ref 30.0–36.0)
MCV: 87.5 fL (ref 78.0–100.0)
Platelets: 32 10*3/uL — ABNORMAL LOW (ref 150–400)
RBC: 2.65 MIL/uL — ABNORMAL LOW (ref 4.22–5.81)
RDW: 15.6 % — ABNORMAL HIGH (ref 11.5–15.5)
WBC: 3 10*3/uL — AB (ref 4.0–10.5)

## 2014-09-14 LAB — I-STAT TROPONIN, ED: Troponin i, poc: 0.01 ng/mL (ref 0.00–0.08)

## 2014-09-14 MED ORDER — OXYCODONE HCL 5 MG PO TABS
5.0000 mg | ORAL_TABLET | ORAL | Status: DC | PRN
  Administered 2014-09-15 – 2014-09-18 (×3): 5 mg via ORAL
  Filled 2014-09-14 (×5): qty 1

## 2014-09-14 MED ORDER — SODIUM CHLORIDE 0.9 % IV SOLN
INTRAVENOUS | Status: AC
  Administered 2014-09-14 – 2014-09-15 (×2): via INTRAVENOUS

## 2014-09-14 MED ORDER — CEFAZOLIN SODIUM 1 G IJ SOLR
1.0000 g | Freq: Three times a day (TID) | INTRAMUSCULAR | Status: DC
  Administered 2014-09-14: 1 g via INTRAMUSCULAR
  Filled 2014-09-14 (×3): qty 10

## 2014-09-14 MED ORDER — GI COCKTAIL ~~LOC~~
30.0000 mL | Freq: Once | ORAL | Status: AC
  Administered 2014-09-14: 30 mL via ORAL
  Filled 2014-09-14: qty 30

## 2014-09-14 MED ORDER — METOPROLOL TARTRATE 12.5 MG HALF TABLET
12.5000 mg | ORAL_TABLET | Freq: Two times a day (BID) | ORAL | Status: DC
  Administered 2014-09-14 – 2014-09-20 (×10): 12.5 mg via ORAL
  Filled 2014-09-14 (×21): qty 1

## 2014-09-14 MED ORDER — ALUM & MAG HYDROXIDE-SIMETH 200-200-20 MG/5ML PO SUSP: 30.0000 mL | Freq: Four times a day (QID) | ORAL | Status: DC | PRN

## 2014-09-14 MED ORDER — ACETAMINOPHEN 500 MG PO TABS
1000.0000 mg | ORAL_TABLET | Freq: Four times a day (QID) | ORAL | Status: AC
  Administered 2014-09-14 – 2014-09-16 (×7): 1000 mg via ORAL
  Filled 2014-09-14 (×10): qty 2

## 2014-09-14 MED ORDER — HYDROMORPHONE HCL 1 MG/ML IJ SOLN: 1.0000 mg | INTRAMUSCULAR | Status: DC | PRN

## 2014-09-14 MED ORDER — SODIUM CHLORIDE 0.9 % IJ SOLN
3.0000 mL | Freq: Two times a day (BID) | INTRAMUSCULAR | Status: DC
  Administered 2014-09-15: 10 mL via INTRAVENOUS
  Administered 2014-09-16 – 2014-09-18 (×2): 3 mL via INTRAVENOUS

## 2014-09-14 MED ORDER — PANTOPRAZOLE SODIUM 40 MG PO TBEC
40.0000 mg | DELAYED_RELEASE_TABLET | Freq: Every day | ORAL | Status: DC
  Administered 2014-09-14 – 2014-09-19 (×6): 40 mg via ORAL
  Filled 2014-09-14 (×6): qty 1

## 2014-09-14 MED ORDER — MIDAZOLAM HCL 2 MG/2ML IJ SOLN
2.0000 mg | Freq: Once | INTRAMUSCULAR | Status: AC
  Administered 2014-09-14: 2 mg via INTRAVENOUS
  Filled 2014-09-14: qty 2

## 2014-09-14 MED ORDER — MORPHINE SULFATE (PF) 2 MG/ML IV SOLN
2.0000 mg | INTRAVENOUS | Status: DC | PRN
Start: 2014-09-14 — End: 2014-09-14

## 2014-09-14 MED ORDER — LORAZEPAM 0.5 MG PO TABS: 0.5000 mg | ORAL_TABLET | Freq: Four times a day (QID) | ORAL | Status: DC | PRN

## 2014-09-14 MED ORDER — DIPHENHYDRAMINE HCL 50 MG/ML IJ SOLN
12.5000 mg | INTRAMUSCULAR | Status: DC | PRN
  Administered 2014-09-15 (×3): 12.5 mg via INTRAVENOUS
  Filled 2014-09-14 (×3): qty 1

## 2014-09-14 MED ORDER — HYDROMORPHONE HCL 2 MG PO TABS
2.0000 mg | ORAL_TABLET | ORAL | Status: DC | PRN
  Administered 2014-09-14 – 2014-09-15 (×3): 2 mg via ORAL
  Filled 2014-09-14 (×3): qty 1

## 2014-09-14 MED ORDER — HYDROMORPHONE HCL 1 MG/ML IJ SOLN
2.0000 mg | Freq: Once | INTRAMUSCULAR | Status: AC
  Administered 2014-09-14: 2 mg via INTRAVENOUS
  Filled 2014-09-14: qty 2

## 2014-09-14 MED ORDER — LORATADINE 10 MG PO TABS: 10.0000 mg | ORAL_TABLET | Freq: Every day | ORAL | Status: DC

## 2014-09-14 MED ORDER — CEFAZOLIN SODIUM 1-5 GM-% IV SOLN
1.0000 g | Freq: Three times a day (TID) | INTRAVENOUS | Status: AC
  Administered 2014-09-15 (×2): 1 g via INTRAVENOUS
  Filled 2014-09-14 (×2): qty 50

## 2014-09-14 MED ORDER — CLEAR EYES COMPLETE OP SOLN: Freq: Every day | OPHTHALMIC | Status: DC | PRN

## 2014-09-14 MED ORDER — ASPIRIN EC 81 MG PO TBEC
81.0000 mg | DELAYED_RELEASE_TABLET | Freq: Every day | ORAL | Status: DC
  Administered 2014-09-14 – 2014-09-20 (×7): 81 mg via ORAL
  Filled 2014-09-14 (×10): qty 1

## 2014-09-14 MED ORDER — DIPHENHYDRAMINE HCL 50 MG/ML IJ SOLN
25.0000 mg | Freq: Once | INTRAMUSCULAR | Status: AC
  Administered 2014-09-14: 25 mg via INTRAVENOUS
  Filled 2014-09-14: qty 1

## 2014-09-14 MED ORDER — SIMVASTATIN 20 MG PO TABS
20.0000 mg | ORAL_TABLET | Freq: Every day | ORAL | Status: DC
  Administered 2014-09-14 – 2014-09-19 (×6): 20 mg via ORAL
  Filled 2014-09-14 (×12): qty 1

## 2014-09-14 MED ORDER — MORPHINE SULFATE (PF) 4 MG/ML IV SOLN: 4.0000 mg | Freq: Once | INTRAVENOUS | Status: DC

## 2014-09-14 MED ORDER — STERILE WATER FOR INJECTION IJ SOLN
INTRAMUSCULAR | Status: AC
  Administered 2014-09-14: 17:00:00
  Filled 2014-09-14: qty 10

## 2014-09-14 MED ORDER — HYDROMORPHONE HCL 1 MG/ML IJ SOLN
1.0000 mg | Freq: Once | INTRAMUSCULAR | Status: AC
  Administered 2014-09-14: 1 mg via INTRAVENOUS
  Filled 2014-09-14: qty 1

## 2014-09-14 MED ORDER — HYDROMORPHONE HCL 1 MG/ML IJ SOLN
1.0000 mg | Freq: Once | INTRAMUSCULAR | Status: DC
  Filled 2014-09-14: qty 1

## 2014-09-14 MED ORDER — IPRATROPIUM-ALBUTEROL 0.5-2.5 (3) MG/3ML IN SOLN
3.0000 mL | RESPIRATORY_TRACT | Status: DC
  Administered 2014-09-14: 3 mL via RESPIRATORY_TRACT
  Filled 2014-09-14: qty 3

## 2014-09-14 MED ORDER — ONDANSETRON HCL 4 MG/2ML IJ SOLN
4.0000 mg | Freq: Four times a day (QID) | INTRAMUSCULAR | Status: DC | PRN
  Administered 2014-09-15: 4 mg via INTRAVENOUS
  Filled 2014-09-14: qty 2

## 2014-09-14 MED ORDER — LIDOCAINE HCL 2 % IJ SOLN
20.0000 mL | Freq: Once | INTRAMUSCULAR | Status: AC
Start: 2014-09-14 — End: 2014-09-14
  Administered 2014-09-14: 400 mg via INTRADERMAL

## 2014-09-14 MED ORDER — ONDANSETRON HCL 4 MG PO TABS: 4.0000 mg | ORAL_TABLET | Freq: Four times a day (QID) | ORAL | Status: DC | PRN

## 2014-09-14 NOTE — Consult Note (Signed)
Reason for Consult:Dr. Gwendolyn Grant ED Referring Physician: left pneumonthorax  [name] is an 69 y.o. male.  HPI: 69 yo man with multiple medical problems including CAD, AAA, hypertension, GERD, hyperlipidemia, myelodyplastic syndrome (on chemo), tobacco abuse and a spontaneous pneumothorax as a teen. He started having pleuritic left sided CP about 2 weeks ago. A CT was unremarkable. ysesrday he had worsening CP and new onset shortness of breath. He came to ED today and was found to have a large left pneumothorax. Still symptomatic at present.  Past Medical History  Diagnosis Date  . Coronary atherosclerosis of native coronary artery     a. 10/2008 inf STEMI/PCI: LM 50d (IVUS-borderline lesion->med rx), LAD min irregs, LCX 35m, 70d, OM nl, RCA 181m (3.5x28 Vision BMS);  b. 11/2008 Lexiscan MV: EF 65%, no isch/scar.  . Essential hypertension, benign   . Pure hypercholesterolemia   . AAA (abdominal aortic aneurysm)     a. 05/2013 CT: 5.8 cm AAA.  Marland Kitchen Diverticulitis     a. 05/2013 CT: descending/sigmoid jxn w/o abscess.  . Osteoarthritis   . Tobacco abuse     a. ongoing - 1ppd for better part of 50 yrs.  . Normocytic anemia   . Cellulitis 06/10/2013    Right antecubital fossa at site of IV  03/12/13  . PONV (postoperative nausea and vomiting)   . Diverticulitis   . GERD (gastroesophageal reflux disease)   . MDS (myelodysplastic syndrome), high grade 04/13/2014    Past Surgical History  Procedure Laterality Date  . Cardiac stents  2010  . Cholecystectomy N/A 06/19/2013    Procedure: LAPAROSCOPIC CHOLECYSTECTOMY;  Surgeon: Shelly Rubenstein, MD;  Location: [facility] OR;  Service: General;  Laterality: N/A;  . Abdominal aortic endovascular stent graft N/A 07/02/2013    Procedure: ABDOMINAL AORTIC ENDOVASCULAR STENT GRAFT;  Surgeon: Nada Libman, MD;  Location: [facility] OR;  Service: Vascular;  Laterality: N/A;  . Esophagogastroduodenoscopy N/A 08/05/2013    Procedure: ESOPHAGOGASTRODUODENOSCOPY (EGD);   Surgeon: Shirley Friar, MD;  Location: [facility] ENDOSCOPY;  Service: Endoscopy;  Laterality: N/A;  . Eye surgery Left     cataract surgery  . Back surgery      cervical fusion  . Colonoscopy with propofol N/A 10/15/2013    Procedure: COLONOSCOPY WITH PROPOFOL;  Surgeon: Shirley Friar, MD;  Location: [facility] ENDOSCOPY;  Service: Endoscopy;  Laterality: N/A;  . Fetal blood transfusion  March 16,17,18, 2016  . Bone marrow biopsy  April 01, 2014    Family History  Problem Relation Age of Onset  . Heart attack Brother     60s  . Cancer Father     Lung  . Cancer Mother     Brain  . Cancer Brother     Kidney    Social History:  reports that he has been smoking Cigarettes.  He started smoking about 50 years ago. He has a 60 pack-year smoking history. He has never used smokeless tobacco. He reports that he does not drink alcohol or use illicit drugs.  Allergies:  Allergies  Allergen Reactions  . Vicodin [Hydrocodone-Acetaminophen] Itching  . Cephalexin Other (See Comments)    Nausea and stomach cramps  . Phenergan [Promethazine Hcl] Other (See Comments)    "restless legs"  . Percocet [Oxycodone-Acetaminophen] Itching    Medications: Prior to Admission:  (Not in a hospital admission)  No results found for this or any previous visit (from the past 48 hour(s)).  Dg Chest 2 View  09/14/2014  CLINICAL DATA:  Pleuritic chest pain and shortness of breath for 2 weeks.  EXAM: CHEST  2 VIEW  COMPARISON:  CT on 09/02/2014  FINDINGS: A large left tension pneumothorax is seen with mild depression of left hemidiaphragm and mediastinal shift to the right. No right-sided pneumothorax visualized. Right lung is clear. Heart size and mediastinal contours are within normal limits.  IMPRESSION: Large left tension pneumothorax.  Critical Value/emergent results were called by telephone at the time of interpretation on 09/14/2014 at 1:11 pm to Dr. Evelina Bucy , who verbally acknowledged these results.    Electronically Signed   By: Earle Gell M.D.   On: 09/14/2014 13:13    Review of Systems  Constitutional: Positive for malaise/fatigue. Negative for fever and chills.  Respiratory: Positive for shortness of breath. Negative for hemoptysis and wheezing.        Pleuritic chest pain   Blood pressure 119/75, pulse 68, temperature 98.3 F (36.8 C), temperature source Oral, resp. rate 14, height $RemoveBe'5\' 10"'[id number]$  (1.778 m), weight 190 lb (86.183 kg), SpO2 100 %. Physical Exam  Vitals reviewed. Constitutional: He is oriented to person, place, and time. He appears well-developed and well-nourished. He appears distressed.  HENT:  Head: Normocephalic and atraumatic.  Mouth/Throat: No oropharyngeal exudate.  Eyes: Conjunctivae and EOM are normal. No scleral icterus.  Neck: No tracheal deviation present. No thyromegaly present.  Cardiovascular: Normal rate, regular rhythm, normal heart sounds and intact distal pulses.   No murmur heard. Respiratory: He has no wheezes.  Increased WOB, absent BS on left, right clear  GI: Soft. He exhibits no distension. There is no tenderness.  Musculoskeletal: He exhibits no edema.  Lymphadenopathy:    He has no cervical adenopathy.  Neurological: He is alert and oriented to person, place, and time. No cranial nerve deficit. He exhibits normal muscle tone.  Skin: Skin is warm and dry.    Assessment/Plan: 69 yo man with multiple medical problems who has a large left spontaneous pneumothorax. He needs a chest tube to re-expand the left lung.  I discussed the indications, risks, and benefits with the patient and his wife. They understand the risks of bleeding, infection, prolonged air leak. He accepts the risks and agrees to proceed.  [name] 09/14/2014, 2:06 PM

## 2014-09-14 NOTE — Telephone Encounter (Signed)
Patient's wife notifying office that EMS was called to transport patient to [facility]. She states patient was doubled over in pain, and EMS assessment was worrisome for cardiac problems. Dr Marin Olp notified.

## 2014-09-14 NOTE — H&P (Signed)
[facility] History and Physical  [name] DHW:[id number] DOB: [birth year] DOA: 09/14/2014  Referring physician:  PCP: Shirline Frees, MD   Chief Complaint: Chest pain  HPI: [name] is a 69 y.o. male with a past medical history of myelodysplasia, undergoing treatment with Vidaza, receiving his last dose yesterday, who follows Dr. Marin Olp at the cancer center, chronic obstructive pulmonary disease, ongoing tobacco abuse, who presents to the emergency department with complaints of left-sided chest pain. He describes his chest pain as burning associated with shortness of breath. He states that symptoms started yesterday afternoon and became progressively worse. In the emergency department an x-ray showed a large left tension pneumothorax. Cardiothoracic surgery was consulted as patient was evaluated by Dr. Roxan Hockey. He underwent chest tube placement in the emergency department. Unfortunately patient reports ongoing tobacco abuse.                                                                     Review of Systems:  Constitutional:  No weight loss, night sweats, Fevers, chills, fatigue.  HEENT:  No headaches, Difficulty swallowing,Tooth/dental problems,Sore throat,  No sneezing, itching, ear ache, nasal congestion, post nasal drip,  Cardio-vascular:  Positive for chest pain, Orthopnea, PND, swelling in lower extremities, anasarca, dizziness, palpitations  GI:  No heartburn, indigestion, abdominal pain, nausea, vomiting, diarrhea, change in bowel habits, loss of appetite  Resp:  Positive for shortness of breath with exertion or at rest. No excess mucus, no productive cough, No non-productive cough, No coughing up of blood.No change in color of mucus.No wheezing.No chest wall deformity  Skin:  no rash or lesions.  GU:  no dysuria, change in color of urine, no urgency or frequency. No flank pain.  Musculoskeletal:  No joint pain or swelling. No decreased range of motion.  No back pain.  Psych:  No change in mood or affect. No depression or anxiety. No memory loss.   Past Medical History  Diagnosis Date  . Coronary atherosclerosis of native coronary artery     a. 10/2008 inf STEMI/PCI: LM 50d (IVUS-borderline lesion->med rx), LAD min irregs, LCX 54m, 70d, OM nl, RCA 178m (3.5x28 Vision BMS);  b. 11/2008 Lexiscan MV: EF 65%, no isch/scar.  . Essential hypertension, benign   . Pure hypercholesterolemia   . AAA (abdominal aortic aneurysm)     a. 05/2013 CT: 5.8 cm AAA.  Marland Kitchen Diverticulitis     a. 05/2013 CT: descending/sigmoid jxn w/o abscess.  . Osteoarthritis   . Tobacco abuse     a. ongoing - 1ppd for better part of 50 yrs.  . Normocytic anemia   . Cellulitis 06/10/2013    Right antecubital fossa at site of IV  03/12/13  . PONV (postoperative nausea and vomiting)   . Diverticulitis   . GERD (gastroesophageal reflux disease)   . MDS (myelodysplastic syndrome), high grade 04/13/2014   Past Surgical History  Procedure Laterality Date  . Cardiac stents  2010  . Cholecystectomy N/A 06/19/2013    Procedure: LAPAROSCOPIC CHOLECYSTECTOMY;  Surgeon: Harl Bowie, MD;  Location: [facility];  Service: General;  Laterality: N/A;  . Abdominal aortic endovascular stent graft N/A 07/02/2013    Procedure: ABDOMINAL AORTIC ENDOVASCULAR STENT GRAFT;  Surgeon: Serafina Mitchell, MD;  Location: [facility] OR;  Service: Vascular;  Laterality: N/A;  . Esophagogastroduodenoscopy N/A 08/05/2013    Procedure: ESOPHAGOGASTRODUODENOSCOPY (EGD);  Surgeon: Lear Ng, MD;  Location: [facility] ENDOSCOPY;  Service: Endoscopy;  Laterality: N/A;  . Eye surgery Left     cataract surgery  . Back surgery      cervical fusion  . Colonoscopy with propofol N/A 10/15/2013    Procedure: COLONOSCOPY WITH PROPOFOL;  Surgeon: Lear Ng, MD;  Location: [facility];  Service: Endoscopy;  Laterality: N/A;  . Fetal blood transfusion  March 16,17,18, 2016  . Bone marrow biopsy  April 01, 2014    Social History:  reports that he has been smoking Cigarettes.  He started smoking about 50 years ago. He has a 60 pack-year smoking history. He has never used smokeless tobacco. He reports that he does not drink alcohol or use illicit drugs.  Allergies  Allergen Reactions  . Vicodin [Hydrocodone-Acetaminophen] Itching  . Cephalexin Other (See Comments)    Nausea and stomach cramps  . Phenergan [Promethazine Hcl] Other (See Comments)    "restless legs"  . Percocet [Oxycodone-Acetaminophen] Itching    Family History  Problem Relation Age of Onset  . Heart attack Brother     36s  . Cancer Father     Lung  . Cancer Mother     Brain  . Cancer Brother     Kidney    Prior to Admission medications   Medication Sig Start Date End Date Taking? Authorizing Provider  acetaminophen (TYLENOL) 325 MG tablet Take 325 mg by mouth every 6 (six) hours as needed for moderate pain.    Historical Provider, MD  aspirin EC 81 MG tablet Take 1 tablet (81 mg total) by mouth daily. Patient taking differently: Take 81 mg by mouth every morning.  03/30/14   Sherren Mocha, MD  B Complex Vitamins (VITAMIN-B COMPLEX PO) Take 1 tablet by mouth every morning.     Historical Provider, MD  cetirizine (ZYRTEC) 10 MG tablet Take 10 mg by mouth daily as needed for allergies.    Historical Provider, MD  clindamycin (CLEOCIN) 300 MG capsule Take 2 capsules 60 minutes before dental procedure. 05/09/14   Volanda Napoleon, MD  Hyprom-Naphaz-Polysorb-Zn Sulf (CLEAR EYES COMPLETE OP) Apply 1-2 drops to eye daily as needed (allergies.).    Historical Provider, MD  lidocaine-prilocaine (EMLA) cream Apply 1 application topically as needed. 09/13/14   Volanda Napoleon, MD  LORazepam (ATIVAN) 0.5 MG tablet Take 1 tablet (0.5 mg total) by mouth every 6 (six) hours as needed (Nausea or vomiting). 04/11/14   Volanda Napoleon, MD  metoprolol tartrate (LOPRESSOR) 25 MG tablet Take 0.5 tablets (12.5 mg total) by mouth 2 (two) times daily.  08/03/13   Sherren Mocha, MD  nitroGLYCERIN (NITROSTAT) 0.4 MG SL tablet Place 1 tablet (0.4 mg total) under the tongue every 5 (five) minutes as needed for chest pain. 03/29/14   Sherren Mocha, MD  pantoprazole (PROTONIX) 40 MG tablet Take 40 mg by mouth at bedtime.  08/06/13   Nita Sells, MD  predniSONE (DELTASONE) 20 MG tablet Take 2 pills a day for 3 days, then 2 pills a day for 3 days, then 1 pill a day for 3 days, then 1/2 pill a day for 5 days 05/09/14   Volanda Napoleon, MD  prochlorperazine (COMPAZINE) 10 MG tablet Take 1 tablet (10 mg total) by mouth every 6 (six) hours as needed (Nausea or vomiting). 04/11/14   Rudell Cobb  Ennever, MD  simvastatin (ZOCOR) 20 MG tablet Take 1 tablet (20 mg total) by mouth at bedtime. 03/30/14   Sherren Mocha, MD  temazepam (RESTORIL) 15 MG capsule TAKE 1 CAPSULE BY MOUTH AT BEDTIME AS NEEDED Patient taking differently: TAKE 1 CAPSULE BY MOUTH AT BEDTIME AS NEEDED FOR SLEEP. 08/26/14   Volanda Napoleon, MD   Physical Exam: Filed Vitals:   09/14/14 1445 09/14/14 1500 09/14/14 1515 09/14/14 1530  BP: 151/78 146/75  139/79  Pulse: 66 66 66 59  Temp:      TempSrc:      Resp: $Remo'13 16 15 16  'dJKgm$ Height:      Weight:      SpO2: 100% 95% 97% 99%    Wt Readings from Last 3 Encounters:  09/14/14 86.183 kg (190 lb)  09/02/14 88.451 kg (195 lb)  08/15/14 87.998 kg (194 lb)    General:  Chronically ill-appearing, awake and alert oriented. On supplemental oxygen. Eyes: PERRL, normal lids, irises & conjunctiva ENT: grossly normal hearing, lips & tongue Neck: no LAD, masses or thyromegaly Cardiovascular: Tachycardic, RRR, no m/r/g. No LE edema. Telemetry: SR, no arrhythmias  Respiratory: He has diminished breath sounds to left lung, associated rhonchi. Right lung was clear to auscultation Abdomen: soft, ntnd Skin: no rash or induration seen on limited exam Musculoskeletal: grossly normal tone BUE/BLE Psychiatric: grossly normal mood and affect, speech fluent  and appropriate Neurologic: grossly non-focal.          Labs on Admission:  Basic Metabolic Panel:  Recent Labs Lab 09/12/14 1325 09/14/14 1457  NA 136 136  K 3.7 3.9  CL  --  104  CO2 21* 24  GLUCOSE 181* 121*  BUN 28.0* 35*  CREATININE 1.4* 1.57*  CALCIUM 8.9 8.5*   Liver Function Tests:  Recent Labs Lab 09/12/14 1325  AST 13  ALT 33  ALKPHOS 79  BILITOT 0.71  PROT 7.1  ALBUMIN 3.6   No results for input(s): LIPASE, AMYLASE in the last 168 hours. No results for input(s): AMMONIA in the last 168 hours. CBC:  Recent Labs Lab 09/12/14 1151 09/14/14 1457  WBC 3.8* 3.0*  HGB 8.3* 7.6*  HCT 24.6* 23.2*  MCV 89 87.5  PLT 37* 32*   Cardiac Enzymes: No results for input(s): CKTOTAL, CKMB, CKMBINDEX, TROPONINI in the last 168 hours.  BNP (last 3 results) No results for input(s): BNP in the last 8760 hours.  ProBNP (last 3 results) No results for input(s): PROBNP in the last 8760 hours.  CBG: No results for input(s): GLUCAP in the last 168 hours.  Radiological Exams on Admission: Dg Chest 2 View  09/14/2014   CLINICAL DATA:  Pleuritic chest pain and shortness of breath for 2 weeks.  EXAM: CHEST  2 VIEW  COMPARISON:  CT on 09/02/2014  FINDINGS: A large left tension pneumothorax is seen with mild depression of left hemidiaphragm and mediastinal shift to the right. No right-sided pneumothorax visualized. Right lung is clear. Heart size and mediastinal contours are within normal limits.  IMPRESSION: Large left tension pneumothorax.  Critical Value/emergent results were called by telephone at the time of interpretation on 09/14/2014 at 1:11 pm to Dr. Evelina Bucy , who verbally acknowledged these results.   Electronically Signed   By: Earle Gell M.D.   On: 09/14/2014 13:13   Dg Chest Portable 1 View  09/14/2014   CLINICAL DATA:  Chest tube insertion.  EXAM: PORTABLE CHEST - 1 VIEW  COMPARISON:  09/14/2014 at 12:50  p.m.  FINDINGS: Interval placement of left-sided  chest tube with tip over the apex. Tiny residual left apical pneumothorax. Lungs are otherwise adequately inflated without consolidation or effusion. Mild stable cardiomegaly. Remainder of the exam is unchanged.  IMPRESSION: Significantly improved left pneumothorax post left chest tube placement as chest tube appears in adequate position. Tiny residual left apical pneumothorax present.  Stable mild cardiomegaly.   Electronically Signed   By: Marin Olp M.D.   On: 09/14/2014 15:21    EKG: Independently reviewed.   Assessment/Plan Principal Problem:   Spontaneous pneumothorax Active Problems:   Chest pain   COPD (chronic obstructive pulmonary disease)   TOBACCO ABUSE   MDS (myelodysplastic syndrome), high grade   1. Spontaneous pneumothorax. Patient is a 69 year old with a history of chronic obstructive pulmonary disease and ongoing tobacco abuse. He presents with complaints of shortness of breath associated with left-sided chest pain. He was found to have a large left-sided tension pneumothorax. Tobacco abuse representing significant risk factor for spontaneous pneumothorax. Cardiothoracic surgery was consulted as he underwent chest tube placement in the emergency department. CT scan of lungs with IV contrast performed on 09/02/2014 reviewed, he has bilateral apical blebs. Patient was extensively counseled on tobacco abuse.  2. History of myelodysplastic syndrome. He currently follows Dr. Marin Olp at the cancer center where he recently underwent 7 days of [place] therapy. He requires intermittent blood transfusions at the cancer center. There has been concerns over iron overload. Initial lab work showing hemoglobin of 7.6. Also had a white count of 3.0 with platelet count of 32,000. Will repeat CBC in a.m. He has been scheduled for Port-A-Cath placement at [facility].  3.  Thrombocytopenia. CBC showing a platelet count of 32,000, decreased from 37,000 on 09/12/2014. Does not have evidence  of bleed. Will monitor CBC. 4. Stage III chronic kidney disease. His creatinine has fluctuated from 1.1-1.4 over the past several months. Labs showing creatinine of 1.57 on admission, will provide IV fluids. 5. DVT prophylaxis. SCDs   Code Status: Full code Family Communication: Spoke with his wife who was present at bedside Disposition Plan: Anticipate patient will require greater than 2 nights hospitalization  Time spent: 65 minutes  Kelvin Cellar [facility] Pager [phone]

## 2014-09-14 NOTE — ED Notes (Signed)
MD at bedside. 

## 2014-09-14 NOTE — ED Provider Notes (Signed)
CSN: [id number]     Arrival date & time 09/14/14  1208 History   First MD Initiated Contact with Patient 09/14/14 1210     No chief complaint on file.    (Consider location/radiation/quality/duration/timing/severity/associated sxs/prior Treatment) Patient is a 69 y.o. male presenting with chest pain. The history is provided by the patient.  Chest Pain Pain location:  Substernal area Pain quality: burning   Pain radiates to:  Does not radiate Pain radiates to the back: no   Pain severity:  Moderate Onset quality:  Gradual Timing:  Constant Progression:  Unchanged Chronicity:  New Context: at rest   Relieved by:  Nothing Worsened by:  Nothing tried Associated symptoms: shortness of breath   Associated symptoms: no cough and no fever   Associated symptoms comment:  Belching, burping   Past Medical History  Diagnosis Date  . Coronary atherosclerosis of native coronary artery     a. 10/2008 inf STEMI/PCI: LM 50d (IVUS-borderline lesion->med rx), LAD min irregs, LCX 19m 70d, OM nl, RCA 1063m3.5x28 Vision BMS);  b. 11/2008 Lexiscan MV: EF 65%, no isch/scar.  . Essential hypertension, benign   . Pure hypercholesterolemia   . AAA (abdominal aortic aneurysm)     a. 05/2013 CT: 5.8 cm AAA.  . Marland Kitcheniverticulitis     a. 05/2013 CT: descending/sigmoid jxn w/o abscess.  . Osteoarthritis   . Tobacco abuse     a. ongoing - 1ppd for better part of 50 yrs.  . Normocytic anemia   . Cellulitis 06/10/2013    Right antecubital fossa at site of IV  03/12/13  . PONV (postoperative nausea and vomiting)   . Diverticulitis   . GERD (gastroesophageal reflux disease)   . MDS (myelodysplastic syndrome), high grade 04/13/2014   Past Surgical History  Procedure Laterality Date  . Cardiac stents  2010  . Cholecystectomy N/A 06/19/2013    Procedure: LAPAROSCOPIC CHOLECYSTECTOMY;  Surgeon: DoHarl BowieMD;  [facility];  Laterality: N/A;  . Abdominal aortic endovascular stent  graft N/A 07/02/2013    Procedure: ABDOMINAL AORTIC ENDOVASCULAR STENT GRAFT;  Surgeon: VaSerafina MitchellMD;  [facility];  Service: Vascular;  Laterality: N/A;  . Esophagogastroduodenoscopy N/A 08/05/2013    Procedure: ESOPHAGOGASTRODUODENOSCOPY (EGD);  Surgeon: ViLear NgMD;  [facility];  Service: Endoscopy;  Laterality: N/A;  . Eye surgery Left     cataract surgery  . Back surgery      cervical fusion  . Colonoscopy with propofol N/A 10/15/2013    Procedure: COLONOSCOPY WITH PROPOFOL;  Surgeon: ViLear NgMD;  [facility]: [facility];  Laterality: N/A;  . Fetal blood transfusion  March 16,17,18, 2016  . Bone marrow biopsy  April 01, 2014   Family History  Problem Relation Age of Onset  . Heart attack Brother     6021s. Cancer Father     Lung  . Cancer Mother     Brain  . Cancer Brother     Kidney   Social History  Substance Use Topics  . Smoking status: Current Every Day Smoker -- 1.00 packs/day for 60 years    Types: Cigarettes    Start date: 04/05/1964  . Smokeless tobacco: Never Used     Comment: 5-23--   STILL SMOKING  . Alcohol Use: No    Review of Systems  Constitutional: Negative for fever.  Respiratory: Positive for shortness of breath. Negative for cough.   Cardiovascular: Positive  for chest pain (central burning in his lungs).  All other systems reviewed and are negative.     Allergies  Vicodin; Cephalexin; Phenergan; and Percocet  Home Medications   Prior to Admission medications   Medication Sig Start Date End Date Taking? Authorizing Provider  acetaminophen (TYLENOL) 325 MG tablet Take 325 mg by mouth every 6 (six) hours as needed for moderate pain.    Historical Provider, MD  aspirin EC 81 MG tablet Take 1 tablet (81 mg total) by mouth daily. Patient taking differently: Take 81 mg by mouth every morning.  03/30/14   Sherren Mocha, MD  B Complex Vitamins (VITAMIN-B COMPLEX PO) Take 1 tablet by  mouth every morning.     Historical Provider, MD  cetirizine (ZYRTEC) 10 MG tablet Take 10 mg by mouth daily as needed for allergies.    Historical Provider, MD  clindamycin (CLEOCIN) 300 MG capsule Take 2 capsules 60 minutes before dental procedure. 05/09/14   Volanda Napoleon, MD  Hyprom-Naphaz-Polysorb-Zn Sulf (CLEAR EYES COMPLETE OP) Apply 1-2 drops to eye daily as needed (allergies.).    Historical Provider, MD  lidocaine-prilocaine (EMLA) cream Apply 1 application topically as needed. 09/13/14   Volanda Napoleon, MD  LORazepam (ATIVAN) 0.5 MG tablet Take 1 tablet (0.5 mg total) by mouth every 6 (six) hours as needed (Nausea or vomiting). 04/11/14   Volanda Napoleon, MD  metoprolol tartrate (LOPRESSOR) 25 MG tablet Take 0.5 tablets (12.5 mg total) by mouth 2 (two) times daily. 08/03/13   Sherren Mocha, MD  nitroGLYCERIN (NITROSTAT) 0.4 MG SL tablet Place 1 tablet (0.4 mg total) under the tongue every 5 (five) minutes as needed for chest pain. 03/29/14   Sherren Mocha, MD  pantoprazole (PROTONIX) 40 MG tablet Take 40 mg by mouth at bedtime.  08/06/13   Nita Sells, MD  predniSONE (DELTASONE) 20 MG tablet Take 2 pills a day for 3 days, then 2 pills a day for 3 days, then 1 pill a day for 3 days, then 1/2 pill a day for 5 days 05/09/14   Volanda Napoleon, MD  prochlorperazine (COMPAZINE) 10 MG tablet Take 1 tablet (10 mg total) by mouth every 6 (six) hours as needed (Nausea or vomiting). 04/11/14   Volanda Napoleon, MD  simvastatin (ZOCOR) 20 MG tablet Take 1 tablet (20 mg total) by mouth at bedtime. 03/30/14   Sherren Mocha, MD  temazepam (RESTORIL) 15 MG capsule TAKE 1 CAPSULE BY MOUTH AT BEDTIME AS NEEDED Patient taking differently: TAKE 1 CAPSULE BY MOUTH AT BEDTIME AS NEEDED FOR SLEEP. 08/26/14   Volanda Napoleon, MD   BP 118/69 mmHg  Pulse 69  Temp(Src) 98.3 F (36.8 C) (Oral)  Resp 16  Ht _0  (1.778 m)  Wt 190 lb (86.183 kg)  BMI 27.26 kg/m2  SpO2 98% Physical Exam  Constitutional: He  is oriented to person, place, and time. He appears well-developed and well-nourished. No distress.  HENT:  Head: Normocephalic and atraumatic.  Mouth/Throat: No oropharyngeal exudate.  Eyes: EOM are normal. Pupils are equal, round, and reactive to light.  Neck: Normal range of motion. Neck supple.  Cardiovascular: Normal rate and regular rhythm.  Exam reveals no friction rub.   No murmur heard. Pulmonary/Chest: Effort normal. No respiratory distress. He has wheezes (mild, diffuse). He has no rales.  Abdominal: Soft. He exhibits no distension. There is no tenderness. There is no rebound.  Musculoskeletal: Normal range of motion. He exhibits no edema.  Neurological: He is  alert and oriented to person, place, and time.  Skin: No rash noted. He is not diaphoretic.  Nursing note and vitals reviewed.   ED Course  Procedures (including critical care time) Labs Review Labs Reviewed  CBC - Abnormal; Notable for the following:    WBC 3.0 (*)    RBC 2.65 (*)    Hemoglobin 7.6 (*)    HCT 23.2 (*)    RDW 15.6 (*)    All other components within normal limits  BASIC METABOLIC PANEL - Abnormal; Notable for the following:    Glucose, Bld 121 (*)    BUN 35 (*)    Creatinine, Ser 1.57 (*)    Calcium 8.5 (*)    GFR calc non Af Amer 44 (*)    GFR calc Af Amer 51 (*)    All other components within normal limits  I-STAT TROPOININ, ED    Imaging Review Dg Chest 2 View  09/14/2014   CLINICAL DATA:  Pleuritic chest pain and shortness of breath for 2 weeks.  EXAM: CHEST  2 VIEW  COMPARISON:  CT on 09/02/2014  FINDINGS: A large left tension pneumothorax is seen with mild depression of left hemidiaphragm and mediastinal shift to the right. No right-sided pneumothorax visualized. Right lung is clear. Heart size and mediastinal contours are within normal limits.  IMPRESSION: Large left tension pneumothorax.  Critical Value/emergent results were called by telephone at the time of interpretation on 09/14/2014  at 1:11 pm to Dr. Evelina Bucy , who verbally acknowledged these results.   Electronically Signed   By: Earle Gell M.D.   On: 09/14/2014 13:13   Dg Chest Portable 1 View  09/14/2014   CLINICAL DATA:  Chest tube insertion.  EXAM: PORTABLE CHEST - 1 VIEW  COMPARISON:  09/14/2014 at 12:50 p.m.  FINDINGS: Interval placement of left-sided chest tube with tip over the apex. Tiny residual left apical pneumothorax. Lungs are otherwise adequately inflated without consolidation or effusion. Mild stable cardiomegaly. Remainder of the exam is unchanged.  IMPRESSION: Significantly improved left pneumothorax post left chest tube placement as chest tube appears in adequate position. Tiny residual left apical pneumothorax present.  Stable mild cardiomegaly.   Electronically Signed   By: Marin Olp M.D.   On: 09/14/2014 15:21   I have personally reviewed and evaluated these images and lab results as part of my medical decision-making.   EKG Interpretation None      MDM   Final diagnoses:  Encounter for chest tube placement    48M here with SOB. Has burning in his lungs and some belching. This is new. Hx of myelodysplasic syndrome, he is currently on chemo. Recent diagnosis of pleurisy after having some lateral chest pain that was sharp. He was seen in the ED at that time and had a negative PE study. Many other medical comorbidities including CAD with a stent, prior AAA repair. Here visibly uncomfortable. Lungs with some occasional wheezing. Will give duoneb, GI cocktail. Will check labs and EKG.  CXR shows left sided pneumothorax. CTS consulted. On re-exam, patient's L lung is diminished.  CT placed chest tube.   Labs ok, show chronic thrombocytopenia and anemia. Stable for discharge.  Evelina Bucy, MD 09/14/14 1556

## 2014-09-14 NOTE — Progress Notes (Signed)
Pt admitted to floor from ED with wife and belongings. VSS, IV intact. CCMD notified. Pt and wife oriented to unit and room. RN reviewed orders. CT to H2O seal, no drainage, dressing CDI. Pt did have some wheezing after assisted sliding  transfer to bed from stretcher but subsided quickly without intervention. Will continue to monitor.

## 2014-09-14 NOTE — Procedures (Signed)
Informed consent obtained  Premedicated with 2 mg versed and 2 mg dilaudid IV  Using sterile technique a 21 F chest tube was placed into left pleural space through a small incision.  + rush of air with pleural entry CT to water seal  Tolerated well

## 2014-09-14 NOTE — ED Notes (Addendum)
Per EMS:  Burning epigastric pain radiates to neck and back and shortness of breath onset yesterday at 1930, lung sounds clear, 98% ra, cancer pt had chemo yesterday.  Was supposed to have portacath placed today at [facility].  Has had previous episodes of this sort of pain and it was attributed to pleurisy.  Received 324 asa and 1 ntg to no relief.  Pain 8/10 but intermittently worse.  MI in past, PCI in 2010.  VSS.  NRS

## 2014-09-15 ENCOUNTER — Inpatient Hospital Stay (HOSPITAL_COMMUNITY): Payer: Medicare Other

## 2014-09-15 ENCOUNTER — Encounter (HOSPITAL_COMMUNITY): Payer: Self-pay | Admitting: General Practice

## 2014-09-15 DIAGNOSIS — D46Z Other myelodysplastic syndromes: Secondary | ICD-10-CM

## 2014-09-15 DIAGNOSIS — D4620 Refractory anemia with excess of blasts, unspecified: Secondary | ICD-10-CM

## 2014-09-15 DIAGNOSIS — J9383 Other pneumothorax: Secondary | ICD-10-CM

## 2014-09-15 LAB — BASIC METABOLIC PANEL
ANION GAP: 8 (ref 5–15)
BUN: 33 mg/dL — ABNORMAL HIGH (ref 6–20)
CALCIUM: 7.9 mg/dL — AB (ref 8.9–10.3)
CHLORIDE: 107 mmol/L (ref 101–111)
CO2: 21 mmol/L — AB (ref 22–32)
Creatinine, Ser: 1.44 mg/dL — ABNORMAL HIGH (ref 0.61–1.24)
GFR calc non Af Amer: 49 mL/min — ABNORMAL LOW (ref >60)
GFR, EST AFRICAN AMERICAN: 57 mL/min — AB (ref >60)
Glucose, Bld: 94 mg/dL (ref 65–99)
POTASSIUM: 4.2 mmol/L (ref 3.5–5.1)
Sodium: 136 mmol/L (ref 135–145)

## 2014-09-15 LAB — CBC
HCT: 20.4 % — ABNORMAL LOW (ref 39.0–52.0)
HEMOGLOBIN: 6.8 g/dL — AB (ref 13.0–17.0)
MCH: 29.1 pg (ref 26.0–34.0)
MCHC: 33.3 g/dL (ref 30.0–36.0)
MCV: 87.2 fL (ref 78.0–100.0)
Platelets: 27 10*3/uL — CL (ref 150–400)
RBC: 2.34 MIL/uL — AB (ref 4.22–5.81)
RDW: 15.8 % — ABNORMAL HIGH (ref 11.5–15.5)
WBC: 3.1 10*3/uL — ABNORMAL LOW (ref 4.0–10.5)

## 2014-09-15 MED ORDER — VARENICLINE TARTRATE 0.5 MG PO TABS
0.5000 mg | ORAL_TABLET | Freq: Every day | ORAL | Status: DC
  Administered 2014-09-15: 0.5 mg via ORAL
  Filled 2014-09-15 (×2): qty 1

## 2014-09-15 MED ORDER — HYDROMORPHONE HCL 2 MG PO TABS
2.0000 mg | ORAL_TABLET | ORAL | Status: DC | PRN
  Administered 2014-09-15 – 2014-09-20 (×16): 2 mg via ORAL
  Filled 2014-09-15 (×16): qty 1

## 2014-09-15 MED ORDER — DEXTROSE 5 % IV SOLN
2000.0000 mg | Freq: Two times a day (BID) | INTRAVENOUS | Status: DC
  Administered 2014-09-15 – 2014-09-16 (×2): 2000 mg via INTRAVENOUS
  Filled 2014-09-15 (×4): qty 2

## 2014-09-15 MED ORDER — DIPHENHYDRAMINE HCL 25 MG PO CAPS
25.0000 mg | ORAL_CAPSULE | Freq: Four times a day (QID) | ORAL | Status: DC | PRN
  Administered 2014-09-15 – 2014-09-20 (×13): 25 mg via ORAL
  Filled 2014-09-15 (×14): qty 1

## 2014-09-15 NOTE — Progress Notes (Signed)
[facility] PROGRESS NOTE  Assessment/Plan: Spontaneous pneumothorax: - S/p chest tube placement by CT surgery 8.30.2016. - Further management per surgery. - increase narcotics.  COPD (chronic obstructive pulmonary disease): - With ongoing tabacco abuse, counseling start chantix.  MDS (myelodysplastic syndrome), high grade: - Undergoing Vidaza treatment. - Hbg trending down, cont to monitor. Ferritin 744, d/w oncology will proceed with chelation therapy.  TOBACCO ABUSE: - Counseling with ongoing Tabacoo abuse.  Stage III chronic kidney disease: His creatinine has fluctuated from 1.  Code Status: Full code Family Communication: Spoke with his wife who was present at bedside Disposition Plan: Anticipate patient will require greater than 2 nights hospitalization   Consultants:  Oncology  CT surgery  Procedures:  Ct chest  Antibiotics:  none  HPI/Subjective: Narcotics not controlling pain.  Objective: Filed Vitals:   09/14/14 1630 09/14/14 1731 09/14/14 2124 09/15/14 0506  BP: 116/82 144/78 123/69 115/66  Pulse: 59 63 66 55  Temp:  97.5 F (36.4 C) 97.6 F (36.4 C) 98.1 F (36.7 C)  TempSrc:  Oral Oral Oral  Resp: 7 18 18 18   Height:  5\' 10"  (1.778 m)    Weight:  88 kg (194 lb 0.1 oz)    SpO2: 99% 100% 100% 100%    Intake/Output Summary (Last 24 hours) at 09/15/14 0828 Last data filed at 09/15/14 0700  Gross per 24 hour  Intake    480 ml  Output   1125 ml  Net   -645 ml   Filed Weights   09/14/14 1215 09/14/14 1731  Weight: 86.183 kg (190 lb) 88 kg (194 lb 0.1 oz)    Exam:  General: Alert, awake, oriented x3, in no acute distress.  HEENT: No bruits, no goiter.  Heart: Regular rate and rhythm. Lungs: chest tube in place. Abdomen: Soft, nontender, nondistended, positive bowel sounds.  Neuro: Grossly intact, nonfocal.   Data Reviewed: Basic Metabolic Panel:  Recent Labs Lab 09/12/14 1325 09/14/14 1457 09/15/14 0451  NA 136  136 136  K 3.7 3.9 4.2  CL  --  104 107  CO2 21* 24 21*  GLUCOSE 181* 121* 94  BUN 28.0* 35* 33*  CREATININE 1.4* 1.57* 1.44*  CALCIUM 8.9 8.5* 7.9*   Liver Function Tests:  Recent Labs Lab 09/12/14 1325  AST 13  ALT 33  ALKPHOS 79  BILITOT 0.71  PROT 7.1  ALBUMIN 3.6   No results for input(s): LIPASE, AMYLASE in the last 168 hours. No results for input(s): AMMONIA in the last 168 hours. CBC:  Recent Labs Lab 09/12/14 1151 09/14/14 1457 09/15/14 0451  WBC 3.8* 3.0* 3.1*  HGB 8.3* 7.6* 6.8*  HCT 24.6* 23.2* 20.4*  MCV 89 87.5 87.2  PLT 37* 32* 27*   Cardiac Enzymes: No results for input(s): CKTOTAL, CKMB, CKMBINDEX, TROPONINI in the last 168 hours. BNP (last 3 results) No results for input(s): BNP in the last 8760 hours.  ProBNP (last 3 results) No results for input(s): PROBNP in the last 8760 hours.  CBG: No results for input(s): GLUCAP in the last 168 hours.  No results found for this or any previous visit (from the past 240 hour(s)).   Studies: Dg Chest 2 View  09/14/2014   CLINICAL DATA:  Pleuritic chest pain and shortness of breath for 2 weeks.  EXAM: CHEST  2 VIEW  COMPARISON:  CT on 09/02/2014  FINDINGS: A large left tension pneumothorax is seen with mild depression of left hemidiaphragm and mediastinal shift to the right.  No right-sided pneumothorax visualized. Right lung is clear. Heart size and mediastinal contours are within normal limits.  IMPRESSION: Large left tension pneumothorax.  Critical Value/emergent results were called by telephone at the time of interpretation on 09/14/2014 at 1:11 pm to Dr. Evelina Bucy , who verbally acknowledged these results.   Electronically Signed   By: Earle Gell M.D.   On: 09/14/2014 13:13   Dg Chest Portable 1 View  09/14/2014   CLINICAL DATA:  Chest tube insertion.  EXAM: PORTABLE CHEST - 1 VIEW  COMPARISON:  09/14/2014 at 12:50 p.m.  FINDINGS: Interval placement of left-sided chest tube with tip over the apex. Tiny  residual left apical pneumothorax. Lungs are otherwise adequately inflated without consolidation or effusion. Mild stable cardiomegaly. Remainder of the exam is unchanged.  IMPRESSION: Significantly improved left pneumothorax post left chest tube placement as chest tube appears in adequate position. Tiny residual left apical pneumothorax present.  Stable mild cardiomegaly.   Electronically Signed   By: Marin Olp M.D.   On: 09/14/2014 15:21    Scheduled Meds: . acetaminophen  1,000 mg Oral Q6H  . aspirin EC  81 mg Oral Daily  .  ceFAZolin (ANCEF) IV  1 g Intravenous Q8H  . metoprolol tartrate  12.5 mg Oral BID  . pantoprazole  40 mg Oral QHS  . simvastatin  20 mg Oral QHS  . sodium chloride  3 mL Intravenous Q12H   Continuous Infusions:   Time Spent:25 min   Charlynne Cousins  [facility] Pager [phone]. If 7PM-7AM, please contact night-coverage at [email or web address], password [facility]: Lambert Rhonda W 09/15/2014, 8:28 AM  LOS: 1 day

## 2014-09-15 NOTE — Consult Note (Signed)
Referral MD  Reason for Referral: Spontaneous pneumothorax of the left lung                                     Refractory anemia with excess blasts   No chief complaint on file. : I collapsed my left lung.  HPI: [name] is a 69 year old gentleman. He is well-known to me. I have been treating him for myelodysplasia. He has refractory anemia with excess blasts. His last bone marrow test that he had done it since he showed stable disease. We went ahead and increased his dose of Vidaza. We also increased the number of days that he gets it. He just completed his first 7 day cycle of Vidaza.  He was at home yesterday. He developed acute onset of chest discomfort. He went to the emergency room. He is found to have a pneumothorax in the left lung.  He was seen by Dr. Roxan Hockey. A chest tube was placed. His left lung has reexpanded.  His labs when he came in showed a Weisser, 3. Hemoglobin 7.6. Platelet count 32.  There is no increased bleeding with the chest tube insertion.  We were asked to see him to try help manage his blood counts during the hospitalization.  This point, he looks pretty good. He is feeling okay. He's had no shortness of breath. He's had some chest discomfort over on the left side.  He said that he has had a pneumothorax before but this was when he was 69 years old.  He's had no fever.   Past Medical History  Diagnosis Date  . Coronary atherosclerosis of native coronary artery     a. 10/2008 inf STEMI/PCI: LM 50d (IVUS-borderline lesion->med rx), LAD min irregs, LCX 9m, 70d, OM nl, RCA 162m (3.5x28 Vision BMS);  b. 11/2008 Lexiscan MV: EF 65%, no isch/scar.  . Essential hypertension, benign   . Pure hypercholesterolemia   . AAA (abdominal aortic aneurysm)     a. 05/2013 CT: 5.8 cm AAA.  Marland Kitchen Diverticulitis     a. 05/2013 CT: descending/sigmoid jxn w/o abscess.  . Osteoarthritis   . Tobacco abuse     a. ongoing - 1ppd for better part of 50 yrs.  . Normocytic anemia    . Cellulitis 06/10/2013    Right antecubital fossa at site of IV  03/12/13  . PONV (postoperative nausea and vomiting)   . Diverticulitis   . GERD (gastroesophageal reflux disease)   . MDS (myelodysplastic syndrome), high grade 04/13/2014  :  Past Surgical History  Procedure Laterality Date  . Cardiac stents  2010  . Cholecystectomy N/A 06/19/2013    Procedure: LAPAROSCOPIC CHOLECYSTECTOMY;  Surgeon: Harl Bowie, MD;  Location: [place];  Service: General;  Laterality: N/A;  . Abdominal aortic endovascular stent graft N/A 07/02/2013    Procedure: ABDOMINAL AORTIC ENDOVASCULAR STENT GRAFT;  Surgeon: Serafina Mitchell, MD;  Location: [facility] OR;  Service: Vascular;  Laterality: N/A;  . Esophagogastroduodenoscopy N/A 08/05/2013    Procedure: ESOPHAGOGASTRODUODENOSCOPY (EGD);  Surgeon: Lear Ng, MD;  Location: [facility] ENDOSCOPY;  Service: Endoscopy;  Laterality: N/A;  . Eye surgery Left     cataract surgery  . Back surgery      cervical fusion  . Colonoscopy with propofol N/A 10/15/2013    Procedure: COLONOSCOPY WITH PROPOFOL;  Surgeon: Lear Ng, MD;  Location: [place];  Service: Endoscopy;  Laterality: N/A;  .  Fetal blood transfusion  March 16,17,18, 2016  . Bone marrow biopsy  April 01, 2014  :   Current facility-administered medications:  .  acetaminophen (TYLENOL) tablet 1,000 mg, 1,000 mg, Oral, Q6H, Melrose Nakayama, MD, 1,000 mg at 09/15/14 0414 .  alum & mag hydroxide-simeth (MAALOX/MYLANTA) 200-200-20 MG/5ML suspension 30 mL, 30 mL, Oral, Q6H PRN, Kelvin Cellar, MD .  aspirin EC tablet 81 mg, 81 mg, Oral, Daily, Kelvin Cellar, MD, 81 mg at 09/14/14 1910 .  ceFAZolin (ANCEF) IVPB 1 g/50 mL premix, 1 g, Intravenous, Q8H, Kelvin Cellar, MD, 1 g at 09/15/14 0014 .  diphenhydrAMINE (BENADRYL) injection 12.5 mg, 12.5 mg, Intravenous, Q4H PRN, Kelvin Cellar, MD, 12.5 mg at 09/15/14 0659 .  HYDROmorphone (DILAUDID) tablet 2 mg, 2 mg, Oral, Q4H PRN, Melrose Nakayama, MD, 2 mg at 09/15/14 [phone] .  LORazepam (ATIVAN) tablet 0.5 mg, 0.5 mg, Oral, Q6H PRN, Kelvin Cellar, MD .  metoprolol tartrate (LOPRESSOR) tablet 12.5 mg, 12.5 mg, Oral, BID, Kelvin Cellar, MD, 12.5 mg at 09/14/14 2301 .  ondansetron (ZOFRAN) tablet 4 mg, 4 mg, Oral, Q6H PRN **OR** ondansetron (ZOFRAN) injection 4 mg, 4 mg, Intravenous, Q6H PRN, Kelvin Cellar, MD .  oxyCODONE (Oxy IR/ROXICODONE) immediate release tablet 5 mg, 5 mg, Oral, Q4H PRN, Kelvin Cellar, MD, 5 mg at 09/15/14 0410 .  pantoprazole (PROTONIX) EC tablet 40 mg, 40 mg, Oral, QHS, Kelvin Cellar, MD, 40 mg at 09/14/14 2301 .  simvastatin (ZOCOR) tablet 20 mg, 20 mg, Oral, QHS, Kelvin Cellar, MD, 20 mg at 09/14/14 2301 .  sodium chloride 0.9 % injection 3 mL, 3 mL, Intravenous, Q12H, Kelvin Cellar, MD, 3 mL at 09/14/14 2302:  . acetaminophen  1,000 mg Oral Q6H  . aspirin EC  81 mg Oral Daily  .  ceFAZolin (ANCEF) IV  1 g Intravenous Q8H  . metoprolol tartrate  12.5 mg Oral BID  . pantoprazole  40 mg Oral QHS  . simvastatin  20 mg Oral QHS  . sodium chloride  3 mL Intravenous Q12H  :  Allergies  Allergen Reactions  . Cephalexin Other (See Comments)    Nausea and stomach cramps  . Phenergan [Promethazine Hcl] Other (See Comments)    "restless legs"  . Vicodin [Hydrocodone-Acetaminophen] Itching  . Percocet [Oxycodone-Acetaminophen] Itching  :  Family History  Problem Relation Age of Onset  . Heart attack Brother     74s  . Cancer Father     Lung  . Cancer Mother     Brain  . Cancer Brother     Kidney  :  Social History   Social History  . Marital Status: Married    Spouse Name: N/A  . Number of Children: N/A  . Years of Education: N/A   Occupational History  . Not on file.   Social History Main Topics  . Smoking status: Current Every Day Smoker -- 1.00 packs/day for 60 years    Types: Cigarettes    Start date: 04/05/1964  . Smokeless tobacco: Never Used     Comment:  5-23--   STILL SMOKING  . Alcohol Use: No  . Drug Use: No  . Sexual Activity: Not on file   Other Topics Concern  . Not on file   Social History Narrative   Lives in [place] with wife.  Retired Furniture conservator/restorer.  Works out in the yard often - limited by claudication.  Does not routinely exercise.  :  Pertinent items are noted in HPI.  Exam: Patient Vitals for the past 24 hrs:  BP Temp Temp src Pulse Resp SpO2 Height Weight  09/15/14 0506 115/66 mmHg 98.1 F (36.7 C) Oral (!) 55 18 100 % - -  09/14/14 2124 123/69 mmHg 97.6 F (36.4 C) Oral 66 18 100 % - -  09/14/14 1731 (!) 144/78 mmHg 97.5 F (36.4 C) Oral 63 18 100 % $Rem'5\' 10"'DatX$  (1.778 m) 194 lb 0.1 oz (88 kg)  09/14/14 1630 116/82 mmHg - - (!) 59 (!) 7 99 % - -  09/14/14 1600 138/65 mmHg - - 64 (!) 7 100 % - -  09/14/14 1530 139/79 mmHg - - (!) 59 16 99 % - -  09/14/14 1515 - - - 66 15 97 % - -  09/14/14 1500 146/75 mmHg - - 66 16 95 % - -  09/14/14 1445 151/78 mmHg - - 66 13 100 % - -  09/14/14 1440 139/64 mmHg - - 76 20 100 % - -  09/14/14 1435 132/58 mmHg - - 73 13 99 % - -  09/14/14 1415 142/91 mmHg - - 64 18 100 % - -  09/14/14 1345 - - - 68 14 100 % - -  09/14/14 1330 119/75 mmHg - - (!) 49 17 100 % - -  09/14/14 1237 (!) 100/51 mmHg - - 66 14 100 % - -  09/14/14 1215 106/67 mmHg 98.3 F (36.8 C) Oral 70 14 97 % $Re'5\' 10"'WiT$  (1.778 m) 190 lb (86.183 kg)    well-developed and well-nourished white gentleman. Head and neck exam shows no adenopathy. There is no petechia in the oral cavity. His lungs are clear. Cardiac exam regular rate and rhythm. He has no murmurs, rubs or bruits. Abdomen is soft. He has no palpable liver or spleen tip. There is no fluid wave. Back exam shows the chest tube in the left lateral chest wall. Extremities shows no clubbing, cyanosis or edema. Skin exam shows no rashes. There is no ecchymoses. Neurological exam is nonfocal.    Recent Labs  09/14/14 1457 09/15/14 0451  WBC 3.0* 3.1*  HGB 7.6* 6.8*  HCT 23.2*  20.4*  PLT 32* 27*    Recent Labs  09/14/14 1457 09/15/14 0451  NA 136 136  K 3.9 4.2  CL 104 107  CO2 24 21*  GLUCOSE 121* 94  BUN 35* 33*  CREATININE 1.57* 1.44*  CALCIUM 8.5* 7.9*    Blood smear review:  None  Pathology: None     Assessment and Plan:  [name] is a 69 year old gentleman with myelodysplasia. He has refractory anemia with excess blasts.  His blood count is lower today. He is asymptomatic. I would like to try to hold on transfusing him if I can.  I appreciate everybody's help with him.  We will follow along.  He and his wife have a very strong faith.  [name] E.  Hebrews 12:28

## 2014-09-15 NOTE — Progress Notes (Signed)
Utilization review completed. Etrulia Zarr, RN, BSN. 

## 2014-09-15 NOTE — Progress Notes (Addendum)
       [address]       [place] [address]             [phone]               Subjective: Sore at CT site, but pain medication helps. No acute CP as on admission.  No dyspnea.   Objective: Vital signs in last 24 hours: Patient Vitals for the past 24 hrs:  BP Temp Temp src Pulse Resp SpO2 Height Weight  09/15/14 0506 115/66 mmHg 98.1 F (36.7 C) Oral (!) 55 18 100 % - -  09/14/14 2124 123/69 mmHg 97.6 F (36.4 C) Oral 66 18 100 % - -  09/14/14 1731 (!) 144/78 mmHg 97.5 F (36.4 C) Oral 63 18 100 % 5\' 10"  (1.778 m) 194 lb 0.1 oz (88 kg)  09/14/14 1630 116/82 mmHg - - (!) 59 (!) 7 99 % - -  09/14/14 1600 138/65 mmHg - - 64 (!) 7 100 % - -  09/14/14 1530 139/79 mmHg - - (!) 59 16 99 % - -  09/14/14 1515 - - - 66 15 97 % - -  09/14/14 1500 146/75 mmHg - - 66 16 95 % - -  09/14/14 1445 151/78 mmHg - - 66 13 100 % - -  09/14/14 1440 139/64 mmHg - - 76 20 100 % - -  09/14/14 1435 132/58 mmHg - - 73 13 99 % - -  09/14/14 1415 142/91 mmHg - - 64 18 100 % - -  09/14/14 1345 - - - 68 14 100 % - -  09/14/14 1330 119/75 mmHg - - (!) 49 17 100 % - -  09/14/14 1237 (!) 100/51 mmHg - - 66 14 100 % - -  09/14/14 1215 106/67 mmHg 98.3 F (36.8 C) Oral 70 14 97 % 5\' 10"  (1.778 m) 190 lb (86.183 kg)   Current Weight  09/14/14 194 lb 0.1 oz (88 kg)     Intake/Output from previous day: 08/31 0701 - 09/01 0700 In: 480 [P.O.:480] Out: 1125 [Urine:1125]    PHYSICAL EXAM:  Heart: RRR Lungs: Coarse BS on R Wound: CT in place, No active bleeding around site Chest tube: 1-2 + air leak with cough, lots of tidaling with normal respiration    Lab Results: CBC: Recent Labs  09/14/14 1457 09/15/14 0451  WBC 3.0* 3.1*  HGB 7.6* 6.8*  HCT 23.2* 20.4*  PLT 32* 27*   BMET:  Recent Labs  09/14/14 1457 09/15/14 0451  NA 136 136  K 3.9 4.2  CL 104 107  CO2 24 21*  GLUCOSE 121* 94  BUN 35* 33*  CREATININE 1.57* 1.44*  CALCIUM 8.5* 7.9*    PT/INR: No  results for input(s): LABPROT, INR in the last 72 hours.    Assessment/Plan: S/P  L chest tube for spontaneous pneumothorax- Chest x-ray not ordered for this am- will order a portable. CT with 1-2 air leak with cough and tidaling with normal respiration.  Will continue CT to water seal for now until CXR reviewed. Medical issues per primary, oncology.    LOS: 1 day    COLLINS,GINA H 09/15/2014  Patient seen and examined, agree with above He still has a significant air leak. His chest x-ray shows the lung is well expanded. Will continue water seal for now.  Revonda Standard Roxan Hockey, MD [facility] [phone]

## 2014-09-15 NOTE — Progress Notes (Signed)
CRITICAL VALUE ALERT  Critical value received:  hgb 6.8, platelets 27  Date of notification:  9/1  Time of notification:  0720  Critical value read back:Yes.    Nurse who received alert:  Radene Ou  MD notified (1st page):  ASSIGNED MD  Time of first page:  0728  MD notified (2nd page):  Time of second page:  Responding MD:  ASSIGNED MD  Time MD responded:  NA

## 2014-09-16 ENCOUNTER — Inpatient Hospital Stay (HOSPITAL_COMMUNITY): Payer: Medicare Other

## 2014-09-16 DIAGNOSIS — Z720 Tobacco use: Secondary | ICD-10-CM

## 2014-09-16 LAB — BASIC METABOLIC PANEL
ANION GAP: 5 (ref 5–15)
BUN: 23 mg/dL — AB (ref 6–20)
CALCIUM: 8.1 mg/dL — AB (ref 8.9–10.3)
CO2: 25 mmol/L (ref 22–32)
CREATININE: 1.24 mg/dL (ref 0.61–1.24)
Chloride: 103 mmol/L (ref 101–111)
GFR calc Af Amer: >60 mL/min (ref >60)
GFR, EST NON AFRICAN AMERICAN: 58 mL/min — AB (ref >60)
GLUCOSE: 137 mg/dL — AB (ref 65–99)
Potassium: 3.8 mmol/L (ref 3.5–5.1)
Sodium: 133 mmol/L — ABNORMAL LOW (ref 135–145)

## 2014-09-16 LAB — CBC
HEMATOCRIT: 20.3 % — AB (ref 39.0–52.0)
Hemoglobin: 6.8 g/dL — CL (ref 13.0–17.0)
MCH: 29.1 pg (ref 26.0–34.0)
MCHC: 33.5 g/dL (ref 30.0–36.0)
MCV: 86.8 fL (ref 78.0–100.0)
PLATELETS: 32 10*3/uL — AB (ref 150–400)
RBC: 2.34 MIL/uL — ABNORMAL LOW (ref 4.22–5.81)
RDW: 15.2 % (ref 11.5–15.5)
WBC: 2.7 10*3/uL — ABNORMAL LOW (ref 4.0–10.5)

## 2014-09-16 LAB — URINALYSIS, ROUTINE W REFLEX MICROSCOPIC
Bilirubin Urine: NEGATIVE
GLUCOSE, UA: NEGATIVE mg/dL
Hgb urine dipstick: NEGATIVE
KETONES UR: NEGATIVE mg/dL
LEUKOCYTES UA: NEGATIVE
Nitrite: NEGATIVE
PH: 5.5 (ref 5.0–8.0)
Protein, ur: NEGATIVE mg/dL
SPECIFIC GRAVITY, URINE: 1.015 (ref 1.005–1.030)
Urobilinogen, UA: 1 mg/dL (ref 0.0–1.0)

## 2014-09-16 MED ORDER — CETYLPYRIDINIUM CHLORIDE 0.05 % MT LIQD: 7.0000 mL | Freq: Two times a day (BID) | OROMUCOSAL | Status: DC

## 2014-09-16 MED ORDER — VARENICLINE TARTRATE 0.5 MG PO TABS
0.5000 mg | ORAL_TABLET | Freq: Two times a day (BID) | ORAL | Status: DC
  Filled 2014-09-16 (×2): qty 1

## 2014-09-16 MED ORDER — VANCOMYCIN HCL 10 G IV SOLR
1750.0000 mg | Freq: Once | INTRAVENOUS | Status: AC
  Administered 2014-09-16: 1750 mg via INTRAVENOUS
  Filled 2014-09-16: qty 1750

## 2014-09-16 MED ORDER — MENTHOL 3 MG MT LOZG
1.0000 | LOZENGE | OROMUCOSAL | Status: DC | PRN
  Filled 2014-09-16: qty 9

## 2014-09-16 MED ORDER — POLYETHYLENE GLYCOL 3350 17 G PO PACK
17.0000 g | PACK | Freq: Two times a day (BID) | ORAL | Status: DC
  Administered 2014-09-16 – 2014-09-19 (×3): 17 g via ORAL
  Filled 2014-09-16 (×14): qty 1

## 2014-09-16 MED ORDER — VANCOMYCIN HCL IN DEXTROSE 750-5 MG/150ML-% IV SOLN
750.0000 mg | Freq: Two times a day (BID) | INTRAVENOUS | Status: DC
  Administered 2014-09-17 (×2): 750 mg via INTRAVENOUS
  Filled 2014-09-16 (×4): qty 150

## 2014-09-16 MED ORDER — DEXTROSE 5 % IV SOLN
2000.0000 mg | Freq: Three times a day (TID) | INTRAVENOUS | Status: DC
  Administered 2014-09-16 – 2014-09-17 (×4): 2000 mg via INTRAVENOUS
  Filled 2014-09-16 (×8): qty 2

## 2014-09-16 MED ORDER — VARENICLINE TARTRATE 1 MG PO TABS: 1.0000 mg | ORAL_TABLET | Freq: Two times a day (BID) | ORAL | Status: DC

## 2014-09-16 MED ORDER — VARENICLINE TARTRATE 0.5 MG PO TABS
0.5000 mg | ORAL_TABLET | Freq: Every day | ORAL | Status: AC
  Administered 2014-09-16 – 2014-09-17 (×2): 0.5 mg via ORAL
  Filled 2014-09-16 (×2): qty 1

## 2014-09-16 MED ORDER — ACETAMINOPHEN 325 MG PO TABS
650.0000 mg | ORAL_TABLET | Freq: Four times a day (QID) | ORAL | Status: DC | PRN
  Administered 2014-09-16 – 2014-09-20 (×5): 650 mg via ORAL
  Filled 2014-09-16 (×5): qty 2

## 2014-09-16 MED ORDER — DEXTROSE 5 % IV SOLN
1.0000 g | Freq: Three times a day (TID) | INTRAVENOUS | Status: DC
  Administered 2014-09-16 – 2014-09-17 (×4): 1 g via INTRAVENOUS
  Filled 2014-09-16 (×7): qty 1

## 2014-09-16 NOTE — Progress Notes (Addendum)
       [address]       [place] [address]             [phone]               Subjective: Not as sore in chest today. Main complaint is throat irritation.  Breathing stable.   Objective: Vital signs in last 24 hours: Patient Vitals for the past 24 hrs:  BP Temp Temp src Pulse Resp SpO2  09/16/14 0332 126/77 mmHg 99.8 F (37.7 C) Oral 74 18 99 %  09/15/14 2130 108/70 mmHg 98.7 F (37.1 C) Oral 72 18 100 %  09/15/14 1400 111/74 mmHg 98.3 F (36.8 C) Oral (!) 59 18 100 %  09/15/14 1117 116/71 mmHg - - 66 - -   Current Weight  09/14/14 194 lb 0.1 oz (88 kg)     Intake/Output from previous day: 09/01 0701 - 09/02 0700 In: 240 [P.O.:240] Out: 1000 [Urine:1000]    PHYSICAL EXAM:  Heart: RRR Lungs: Coarse rhonchi bilaterally Chest tube: + air leak with cough only     Lab Results: CBC: Recent Labs  09/15/14 0451 09/16/14 0755  WBC 3.1* 2.7*  HGB 6.8* 6.8*  HCT 20.4* 20.3*  PLT 27* PENDING   BMET:  Recent Labs  09/15/14 0451 09/16/14 0755  NA 136 133*  K 4.2 3.8  CL 107 103  CO2 21* 25  GLUCOSE 94 137*  BUN 33* 23*  CREATININE 1.44* 1.24  CALCIUM 7.9* 8.1*    PT/INR: No results for input(s): LABPROT, INR in the last 72 hours.  CXR: FINDINGS: The heart is mildly enlarged. A left-sided chest tube remains in place. There is no definite residual pneumothorax. Mild pulmonary vascular congestion is present. Mild left basilar airspace disease likely reflects atelectasis. The visualized soft tissues and bony thorax are unremarkable.  IMPRESSION: 1. Stable positioning of left-sided chest tube without significant residual or recurrent pneumothorax. 2. Borderline cardiomegaly is exaggerated by low lung volumes. 3. Mild bibasilar atelectasis, worse on the left.   Assessment/Plan: S/P  L CT for spontaneous pneumothorax-  CXR stable, no ptx. CT has a persistent air leak with cough, but I do not see a leak with normal  respiration today.  Continue CT to water seal for now. Continue medical care per primary, oncology.    LOS: 2 days    COLLINS,GINA H 09/16/2014  Patient seen and examined, agree with above Air leak still present but definitely improved  Remo Lipps C. Roxan Hockey, MD [facility] [phone]

## 2014-09-16 NOTE — Care Management Important Message (Signed)
Important Message  Patient Details  Name: [name] MRN: [id number] Date of Birth: 19-Dec-1945   Medicare Important Message Given:  [facility] notification given    Nathen May 09/16/2014, 10:28 AMImportant Message  Patient Details  Name: [name] MRN: [id number] Date of Birth: 1945-10-10   Medicare Important Message Given:  Yes-second notification given    Nathen May 09/16/2014, 10:28 AM

## 2014-09-16 NOTE — Progress Notes (Signed)
Mr. Colan looks a little bit better this AM.  The chest tube is still in place.  CXR yesterday show near complete resolution of the pneunothorax.  He really is not hurting much. He really is not getting out of bed yet.  His hemoglobin yesterday was 6.8. I told he and his wife that whenever his body is under stress, his bone marrow will tend to show itself down and his blood counts can definitely get worse.  His CBC is not yet back today. We will see what it is.  He is on Desferal. I think this is a very good idea. I think that we should try to give him as much as we can while he is in the hospital. I will increase the frequency to every 8 hour dosing.  He has had no fever. He has had no bleeding.  His appetite seems be doing okay.  On his physical exam, he is slightly febrile at 99.8. Blood pressure is 126/77. Pulse is 74. His lungs sound clear bilaterally. He has no wheezes. Cardiac exam regular rate and rhythm with a normal S1 and S2. He has no murmurs, rubs or bruits. Abdomen is soft. He has decent bowel sounds. There is no fluid wave. There is no palpable liver or spleen tip. Extremities shows no clubbing, cyanosis or edema. Neurological exam is nonfocal. Skin exam shows no ecchymoses or petechia.  We will await the results from his CBC. I probably would make sure that his blood count was checked daily while he is in the hospital.  Possibly, he might be discharged today, if not today then possibly tomorrow.  I will decide as whether not he needs to be transfused. I will like to hope that we hold off on transfusing him.  The Desferal, again, is a great idea by Dr. Venetia Constable. At some point, I suspected that he might need oral iron chelation therapy.  I very much appreciate all the outstanding care that he is getting from the staff on [place] 46:10

## 2014-09-16 NOTE — Progress Notes (Signed)
ANTIBIOTIC CONSULT NOTE - INITIAL  Pharmacy Consult for Vancomycin Indication: Febrile Neutropenia  Allergies  Allergen Reactions  . Cephalexin Other (See Comments)    Nausea and stomach cramps  . Phenergan [Promethazine Hcl] Other (See Comments)    "restless legs"  . Vicodin [Hydrocodone-Acetaminophen] Itching  . Percocet [Oxycodone-Acetaminophen] Itching    Patient Measurements: Height: 5\' 10"  (177.8 cm) Weight: 194 lb 0.1 oz (88 kg) IBW/kg (Calculated) : 73  Vital Signs: Temp: 100.9 F (Darrell.3 C) (09/02 1327) Temp Source: Oral (09/02 1327) BP: 115/63 mmHg (09/02 1327) Pulse Rate: 77 (09/02 1327) Intake/Output from previous day: 09/01 0701 - 09/02 0700 In: 240 [P.O.:240] Out: 1000 [Urine:1000] Intake/Output from this shift: Total I/O In: 240 [P.O.:240] Out: 200 [Urine:200]  Labs:  Recent Labs  09/14/14 1457 09/15/14 0451 09/16/14 0755  WBC 3.0* 3.1* 2.7*  HGB 7.6* 6.8* 6.8*  PLT 32* 27* 32*  CREATININE 1.57* 1.44* 1.24   Estimated Creatinine Clearance: 64.6 mL/min (by C-G formula based on Cr of 1.24). No results for input(s): VANCOTROUGH, VANCOPEAK, VANCORANDOM, GENTTROUGH, GENTPEAK, GENTRANDOM, TOBRATROUGH, TOBRAPEAK, TOBRARND, AMIKACINPEAK, AMIKACINTROU, AMIKACIN in the last 72 hours.   Microbiology: No results found for this or any previous visit (from the past 720 hour(s)).  Medical History: Past Medical History  Diagnosis Date  . Coronary atherosclerosis of native coronary artery     a. 10/2008 inf STEMI/PCI: LM 50d (IVUS-borderline lesion->med rx), LAD min irregs, LCX 73m, 70d, OM nl, RCA 145m (3.5x28 Vision BMS);  b. 11/2008 Lexiscan MV: EF 65%, no isch/scar.  . Essential hypertension, benign   . Pure hypercholesterolemia   . AAA (abdominal aortic aneurysm)     a. 05/2013 CT: 5.8 cm AAA.  Marland Kitchen Diverticulitis     a. 05/2013 CT: descending/sigmoid jxn w/o abscess.  . Osteoarthritis   . Tobacco abuse     a. ongoing - 1ppd for better part of 50 yrs.  .  Normocytic anemia   . Cellulitis 06/10/2013    Right antecubital fossa at site of IV  03/12/13  . PONV (postoperative nausea and vomiting)   . Diverticulitis   . GERD (gastroesophageal reflux disease)   . MDS (myelodysplastic syndrome), high grade 04/13/2014  . Myocardial infarction 2010  . Depression   . Spontaneous pneumothorax 09/14/2014    LEFT LUNG    Assessment: 69 yo [name] has fever of unknown source. Pharmacy consulted to dose vancomycin. CXR showed no infiltrates and will check UA. Febrile up to 100.9, WBC low at 2.7. SCr down to 1.24 (peaked at 1.57), CrCl ~64ml/min.  Goal of Therapy:  Vancomycin trough level 15-20 mcg/ml  Resolution of infection  Plan:  Give vancomycin 1.75g IV x 1, then start 750mg  IV Q12 Start ceftazidime 1g IV Q8 per MD Monitor clinical picture, renal function, VT at Css F/U C&S, abx deescalation / LOT  Reginia Naas 09/16/2014,3:26 PM

## 2014-09-16 NOTE — Progress Notes (Signed)
Pt had a temp of 100.9, MD notified and new orders received; pt educated on IS and encouraged to use; pt able to demonstrate back to RN successfully; pt ambulated with RN to BR x1 with walker during shift; pt offered to ambulate in hallways but kept on postponing; chest tube dsg changed with charge RN as dsg had some old marked drainage. Site unremarkable; 650mg  Tylenol administered; pt reports decreased appetite and Ensure given; pt in bed comfortably with family at bedside. Reported off to oncoming RN. Delia Heady RN

## 2014-09-16 NOTE — Progress Notes (Addendum)
[facility] PROGRESS NOTE  Assessment/Plan: Spontaneous pneumothorax: - S/p chest tube placement by CT surgery 8.30.2016. - Chest x-ray shows improvement in his pneumothorax. - Further management per surgery. - Continue narcotics, will add MiraLAX has not had a bowel movement in several days.  COPD (chronic obstructive pulmonary disease): - With ongoing tabacco abuse, counseling start chantix.  MDS (myelodysplastic syndrome), high grade: - Cc has remained stable at 6.8. - Cont to monitor Hbg. Ferritin 744, d/w oncology will proceed with chelation therapy.  TOBACCO ABUSE: - Counseling with ongoing Tabacoo abuse.  Stage III chronic kidney disease: His creatinine has fluctuated from 1.  Fever of unknown source: - Start IV vanc and fortaz. -CXR showed no infiltrate check U/A. - no cough or SOB no cuts or sores.  - check BC x2 Code Status: Full code Family Communication: Spoke with his wife who was present at bedside Disposition Plan: Anticipate patient will require greater than 2 nights hospitalization   Consultants:  Oncology  CT surgery  Procedures:  Ct chest  Antibiotics:  none  HPI/Subjective: Pain well controlled with narcotics.  Objective: Filed Vitals:   09/15/14 1117 09/15/14 1400 09/15/14 2130 09/16/14 0332  BP: 116/71 111/74 108/70 126/77  Pulse: 66 59 72 74  Temp:  98.3 F (36.8 C) 98.7 F (37.1 C) 99.8 F (37.7 C)  TempSrc:  Oral Oral Oral  Resp:  18 18 18   Height:      Weight:      SpO2:  100% 100% 99%    Intake/Output Summary (Last 24 hours) at 09/16/14 0911 Last data filed at 09/16/14 0023  Gross per 24 hour  Intake      0 ml  Output   1000 ml  Net  -1000 ml   Filed Weights   09/14/14 1215 09/14/14 1731  Weight: 86.183 kg (190 lb) 88 kg (194 lb 0.1 oz)    Exam:  General: Alert, awake, oriented x3, in no acute distress.  HEENT: No bruits, no goiter.  Heart: Regular rate and rhythm. Lungs: chest tube in  place. Abdomen: Soft, nontender, nondistended, positive bowel sounds.  Neuro: Grossly intact, nonfocal.   Data Reviewed: Basic Metabolic Panel:  Recent Labs Lab 09/12/14 1325 09/14/14 1457 09/15/14 0451 09/16/14 0755  NA 136 136 136 133*  K 3.7 3.9 4.2 3.8  CL  --  104 107 103  CO2 21* 24 21* 25  GLUCOSE 181* 121* 94 137*  BUN 28.0* 35* 33* 23*  CREATININE 1.4* 1.57* 1.44* 1.24  CALCIUM 8.9 8.5* 7.9* 8.1*   Liver Function Tests:  Recent Labs Lab 09/12/14 1325  AST 13  ALT 33  ALKPHOS 79  BILITOT 0.71  PROT 7.1  ALBUMIN 3.6   No results for input(s): LIPASE, AMYLASE in the last 168 hours. No results for input(s): AMMONIA in the last 168 hours. CBC:  Recent Labs Lab 09/12/14 1151 09/14/14 1457 09/15/14 0451 09/16/14 0755  WBC 3.8* 3.0* 3.1* 2.7*  HGB 8.3* 7.6* 6.8* 6.8*  HCT 24.6* 23.2* 20.4* 20.3*  MCV 89 87.5 87.2 86.8  PLT 37* 32* 27* PENDING   Cardiac Enzymes: No results for input(s): CKTOTAL, CKMB, CKMBINDEX, TROPONINI in the last 168 hours. BNP (last 3 results) No results for input(s): BNP in the last 8760 hours.  ProBNP (last 3 results) No results for input(s): PROBNP in the last 8760 hours.  CBG: No results for input(s): GLUCAP in the last 168 hours.  No results found for this or any previous visit (  from the past 240 hour(s)).   Studies: Dg Chest 2 View  09/14/2014   CLINICAL DATA:  Pleuritic chest pain and shortness of breath for 2 weeks.  EXAM: CHEST  2 VIEW  COMPARISON:  CT on 09/02/2014  FINDINGS: A large left tension pneumothorax is seen with mild depression of left hemidiaphragm and mediastinal shift to the right. No right-sided pneumothorax visualized. Right lung is clear. Heart size and mediastinal contours are within normal limits.  IMPRESSION: Large left tension pneumothorax.  Critical Value/emergent results were called by telephone at the time of interpretation on 09/14/2014 at 1:11 pm to Dr. Evelina Bucy , who verbally acknowledged  these results.   Electronically Signed   By: Earle Gell M.D.   On: 09/14/2014 13:13   Dg Chest Port 1 View  09/16/2014   CLINICAL DATA:  Chest tubes in place.  Pneumothorax.  EXAM: PORTABLE CHEST - 1 VIEW  COMPARISON:  One-view chest x-ray 09/15/2014.  FINDINGS: The heart is mildly enlarged. A left-sided chest tube remains in place. There is no definite residual pneumothorax. Mild pulmonary vascular congestion is present. Mild left basilar airspace disease likely reflects atelectasis. The visualized soft tissues and bony thorax are unremarkable.  IMPRESSION: 1. Stable positioning of left-sided chest tube without significant residual or recurrent pneumothorax. 2. Borderline cardiomegaly is exaggerated by low lung volumes. 3. Mild bibasilar atelectasis, worse on the left.   Electronically Signed   By: San Morelle M.D.   On: 09/16/2014 08:06   Dg Chest Portable 1 View  09/14/2014   CLINICAL DATA:  Chest tube insertion.  EXAM: PORTABLE CHEST - 1 VIEW  COMPARISON:  09/14/2014 at 12:50 p.m.  FINDINGS: Interval placement of left-sided chest tube with tip over the apex. Tiny residual left apical pneumothorax. Lungs are otherwise adequately inflated without consolidation or effusion. Mild stable cardiomegaly. Remainder of the exam is unchanged.  IMPRESSION: Significantly improved left pneumothorax post left chest tube placement as chest tube appears in adequate position. Tiny residual left apical pneumothorax present.  Stable mild cardiomegaly.   Electronically Signed   By: Marin Olp M.D.   On: 09/14/2014 15:21   Dg Chest Port 1v Same Day  09/15/2014   CLINICAL DATA:  Follow-up left pneumothorax.  EXAM: PORTABLE CHEST - 1 VIEW SAME DAY  COMPARISON:  Chest radiograph from 1 day prior.  FINDINGS: Left apical chest tube is stable in position. There is a tiny residual lateral apical left pneumothorax, decreased and nearly resolved. No right pneumothorax. No pleural effusion. No pulmonary edema. Mild left  basilar atelectasis. Stable cardiomediastinal silhouette with normal heart size.  IMPRESSION: 1. Tiny lateral apical left pneumothorax, decreased and nearly resolved. 2. Mild left basilar atelectasis.   Electronically Signed   By: Ilona Sorrel M.D.   On: 09/15/2014 09:18    Scheduled Meds: . acetaminophen  1,000 mg Oral Q6H  . [START ON 09/17/2014] antiseptic oral rinse  7 mL Mouth Rinse BID  . aspirin EC  81 mg Oral Daily  . deferoxamine (DESFERAL) IV  2,000 mg Intravenous Q8H  . metoprolol tartrate  12.5 mg Oral BID  . pantoprazole  40 mg Oral QHS  . simvastatin  20 mg Oral QHS  . sodium chloride  3 mL Intravenous Q12H  . varenicline  0.5 mg Oral Daily   Continuous Infusions:   Time Spent:15 min   Charlynne Cousins  [facility] Pager [phone]. If 7PM-7AM, please contact night-coverage at [email or web address], password [facility] 09/16/2014, 9:11 AM  LOS: 2  days

## 2014-09-17 ENCOUNTER — Inpatient Hospital Stay (HOSPITAL_COMMUNITY): Payer: Medicare Other

## 2014-09-17 DIAGNOSIS — R509 Fever, unspecified: Secondary | ICD-10-CM

## 2014-09-17 DIAGNOSIS — J939 Pneumothorax, unspecified: Secondary | ICD-10-CM

## 2014-09-17 LAB — BASIC METABOLIC PANEL
Anion gap: 6 (ref 5–15)
BUN: 16 mg/dL (ref 6–20)
CALCIUM: 8.1 mg/dL — AB (ref 8.9–10.3)
CHLORIDE: 101 mmol/L (ref 101–111)
CO2: 26 mmol/L (ref 22–32)
CREATININE: 1.19 mg/dL (ref 0.61–1.24)
GFR calc non Af Amer: >60 mL/min (ref >60)
GLUCOSE: 121 mg/dL — AB (ref 65–99)
Potassium: 4 mmol/L (ref 3.5–5.1)
Sodium: 133 mmol/L — ABNORMAL LOW (ref 135–145)

## 2014-09-17 LAB — CBC
HEMATOCRIT: 18 % — AB (ref 39.0–52.0)
HEMOGLOBIN: 6.2 g/dL — AB (ref 13.0–17.0)
MCH: 29.4 pg (ref 26.0–34.0)
MCHC: 34.4 g/dL (ref 30.0–36.0)
MCV: 85.3 fL (ref 78.0–100.0)
Platelets: 23 10*3/uL — CL (ref 150–400)
RBC: 2.11 MIL/uL — ABNORMAL LOW (ref 4.22–5.81)
RDW: 14.9 % (ref 11.5–15.5)
WBC: 2.7 10*3/uL — ABNORMAL LOW (ref 4.0–10.5)

## 2014-09-17 LAB — PREPARE RBC (CROSSMATCH)

## 2014-09-17 MED ORDER — SODIUM CHLORIDE 0.9 % IV SOLN: Freq: Once | INTRAVENOUS | Status: AC

## 2014-09-17 MED ORDER — SODIUM CHLORIDE 0.9 % IV SOLN: Freq: Once | INTRAVENOUS | Status: DC

## 2014-09-17 MED ORDER — FUROSEMIDE 10 MG/ML IJ SOLN
20.0000 mg | Freq: Once | INTRAMUSCULAR | Status: DC
  Filled 2014-09-17: qty 2

## 2014-09-17 MED ORDER — SACCHAROMYCES BOULARDII 250 MG PO CAPS
250.0000 mg | ORAL_CAPSULE | Freq: Two times a day (BID) | ORAL | Status: DC
Start: 2014-09-17 — End: 2014-09-20
  Administered 2014-09-17 – 2014-09-20 (×6): 250 mg via ORAL
  Filled 2014-09-17 (×11): qty 1

## 2014-09-17 MED ORDER — LEVALBUTEROL HCL 0.63 MG/3ML IN NEBU: 0.6300 mg | INHALATION_SOLUTION | Freq: Three times a day (TID) | RESPIRATORY_TRACT | Status: DC | PRN

## 2014-09-17 MED ORDER — MAGIC MOUTHWASH
15.0000 mL | Freq: Three times a day (TID) | ORAL | Status: DC
  Administered 2014-09-17 – 2014-09-20 (×6): 15 mL via ORAL
  Filled 2014-09-17 (×13): qty 15

## 2014-09-17 MED ORDER — HEPARIN SODIUM (PORCINE) 5000 UNIT/ML IJ SOLN
5000.0000 [IU] | Freq: Three times a day (TID) | INTRAMUSCULAR | Status: DC
  Administered 2014-09-17 (×2): 5000 [IU] via SUBCUTANEOUS
  Filled 2014-09-17 (×6): qty 1

## 2014-09-17 NOTE — Progress Notes (Signed)
[name] had a temperature last night. He was up to 101.2. Cultures were taken.  He still has the chest tube in on the left side. A chest x-ray was done today. The report is not back. His wife said he's had some congestion.  His hemoglobin is down to 6.2. I really think that is going had to be transfused. I does think that a transfusion will help him and will help take "stress" off his heart.  His platelet count is 23,000. He does not need a transfusion. There is no bleeding. His white cell count is 2.7. There is no differential on the white cell count. I don't think he needs any Neupogen.   He is on Desferal. He gets his every 8 hours.  His appetite is down. He's had no nausea or vomiting.  It is hard for him to get out of bed and walk. I think it would be reasonable to Off his IVs so that he could walk on occasion. This makes life a lot easier for him.  He's had no rashes. He's had no leg swelling.  I would make sure that he has on compression stockings or compression boots to help with DVT management. Even though he is thrombocyte PE, I do suspect that he is hypercoagulable because of his underlying hematologic issues.  On physical exam, his temperature is 101.2. Pulse 92. Blood pressure 113/70. Oral exam shows no obvious thrush. There is no mucositis. Lungs show some wheezing bilaterally. He has decent air movement. Cardiac exam regular rate and rhythm with no murmurs, rubs or bruits. Abdomen is soft. There is no palpable liver or spleen tip. Extremities shows no clubbing, cyanosis or edema. Skin exam shows no rashes, ecchymoses or petechia. Neurological exam is non-focal.  I appreciate the great care that he is getting. This is a very, complicated situation.  [name] E.  Psalm 51:10

## 2014-09-17 NOTE — Progress Notes (Signed)
Confirmed with MD to just transfuse one unit of PRBCs.  Pt currently receiving 1 unit PRBCs.  Will continue to monitor.

## 2014-09-17 NOTE — Progress Notes (Signed)
VASCULAR LAB PRELIMINARY  PRELIMINARY  PRELIMINARY  PRELIMINARY  Bilateral lower extremity venous duplex completed.    Preliminary report:  Right:  No evidence of DVT, superficial thrombosis, or Baker's cyst. LEFT- Positive for a partially occlusive DVT of the popliteal vein at the level of the popliteal fossa. No evidence of a superficial thrombosis or Baker's cyst  Blessing Zaucha, RVS 09/17/2014, 11:02 AM

## 2014-09-17 NOTE — Progress Notes (Addendum)
[address]       [place] [address]             [phone]          Subjective: Feels ok, having some fevers  Objective: Vital signs in last 24 hours: Temp:  [98.2 F (36.8 C)-101.2 F (38.4 C)] 101.2 F (38.4 C) (09/03 0535) Pulse Rate:  [70-92] 92 (09/03 0535) Cardiac Rhythm:  [-] Normal sinus rhythm (09/02 1915) Resp:  [17-18] 18 (09/03 0535) BP: (94-115)/(60-70) 113/70 mmHg (09/03 0535) SpO2:  [94 %-97 %] 95 % (09/03 0535)  Hemodynamic parameters for last 24 hours:    Intake/Output from previous day: 09/02 0701 - 09/03 0700 In: 2650 [P.O.:600; IV Piggyback:2050] Out: 1200 [Urine:1200] Intake/Output this shift:    General appearance: alert, cooperative and no distress Heart: regular rate and rhythm Lungs: dim left>right lower fields Abdomen: benign Extremities: no edema Wound: dressing CDI  Lab Results:  Recent Labs  09/16/14 0755 09/17/14 0337  WBC 2.7* 2.7*  HGB 6.8* 6.2*  HCT 20.3* 18.0*  PLT 32* 23*   BMET:   Recent Labs  09/16/14 0755 09/17/14 0337  NA 133* 133*  K 3.8 4.0  CL 103 101  CO2 25 26  GLUCOSE 137* 121*  BUN 23* 16  CREATININE 1.24 1.19  CALCIUM 8.1* 8.1*    PT/INR: No results for input(s): LABPROT, INR in the last 72 hours. ABG    Component Value Date/Time   PHART 7.427 07/02/2013 0601   HCO3 23.8 07/02/2013 0601   TCO2 24 07/02/2013 0610   O2SAT 97.8 07/02/2013 0601   CBG (last 3)  No results for input(s): GLUCAP in the last 72 hours.  Meds Scheduled Meds: . sodium chloride   Intravenous Once  . aspirin EC  81 mg Oral Daily  . cefTAZidime (FORTAZ)  IV  1 g Intravenous 3 times per day  . deferoxamine (DESFERAL) IV  2,000 mg Intravenous Q8H  . furosemide  20 mg Intravenous Once  . magic mouthwash  15 mL Oral TID  . metoprolol tartrate  12.5 mg Oral BID  . pantoprazole  40 mg Oral QHS  . polyethylene glycol  17 g Oral BID  . saccharomyces boulardii  250 mg Oral BID  . simvastatin  20  mg Oral QHS  . sodium chloride  3 mL Intravenous Q12H  . vancomycin  750 mg Intravenous Q12H  . varenicline  0.5 mg Oral Daily   Followed by  . [START ON 09/18/2014] varenicline  0.5 mg Oral BID   Followed by  . [START ON 09/22/2014] varenicline  1 mg Oral BID   Continuous Infusions:  PRN Meds:.acetaminophen, alum & mag hydroxide-simeth, diphenhydrAMINE, HYDROmorphone, levalbuterol, LORazepam, menthol-cetylpyridinium, ondansetron **OR** ondansetron (ZOFRAN) IV, oxyCODONE  Xrays Dg Chest Port 1 View  09/16/2014   CLINICAL DATA:  Chest tubes in place.  Pneumothorax.  EXAM: PORTABLE CHEST - 1 VIEW  COMPARISON:  One-view chest x-ray 09/15/2014.  FINDINGS: The heart is mildly enlarged. A left-sided chest tube remains in place. There is no definite residual pneumothorax. Mild pulmonary vascular congestion is present. Mild left basilar airspace disease likely reflects atelectasis. The visualized soft tissues and bony thorax are unremarkable.  IMPRESSION: 1. Stable positioning of left-sided chest tube without significant residual or recurrent pneumothorax. 2. Borderline cardiomegaly is exaggerated by low lung volumes. 3. Mild bibasilar atelectasis, worse on the left.   Electronically Signed   By: Wynetta Fines.D.  On: 09/16/2014 08:06    Assessment/Plan: S/P left CT for spont pntx, has mod air leak on H20 seal , may have small apical pntx- keep tube for now Medical care by primary/oncology   LOS: 3 days    GOLD,WAYNE E 09/17/2014  Patient seen and examined, agree with above He has a new fever. Still has a small air leak  CT site is clean with no evidence of infection He has a partially occlusive popliteal DVT- Hospitalists and Oncology to manage  Remo Lipps C. Roxan Hockey, MD [facility] [phone]

## 2014-09-17 NOTE — Progress Notes (Signed)
Pt refused to ambulate this afternoon and again this evening.  The pt's wife stated he was much too weak to walk.  I educated the pt and his wife but they still refused.  They stated that the pt may try again tomorrow.  The pt also refused to sit in the chair.

## 2014-09-17 NOTE — Progress Notes (Signed)
Notified Physician regarding pt's doppler results.  MD ordered that SCDs not be placed on pt on either leg and to walk pt.  I notified the pt and his wife of MD's recommendation to ambulate.  Pt was offered to ambulate but declined until later this afternoon.  I educated the pt and attempted to persuade him to sit in the chair.  He refused this as well at this time.  The pt was educated to not cross his legs, but is noncompliant with this as well.  Will continue to monitor.

## 2014-09-17 NOTE — Progress Notes (Signed)
Re-timed dexteral, as pt had enough medication until 11 AM this morning still in the bag.

## 2014-09-17 NOTE — Progress Notes (Signed)
[facility] PROGRESS NOTE  Assessment/Plan: Spontaneous pneumothorax: - S/p chest tube placement by CT surgery 8.30.2016. - Chest x-ray shows improvement in his pneumothorax. - Further management per surgery. - Continue narcotics and MiraLAX, had BM on 8.2.2016  COPD (chronic obstructive pulmonary disease): - With ongoing tabacco abuse, counseling started chantix.  MDS (myelodysplastic syndrome), high grade: - Hbg now 6.2 agree with transfusion. CBC post transfusional and lasix. - Cont to monitor Hbg. Ferritin 744. - Will proceed with chelation therapy.  TOBACCO ABUSE: - Counseling with ongoing Tabacoo abuse.  Stage III chronic kidney disease: His creatinine has fluctuated from 1.  Fever of unknown source: - he is on SCD since 8.31.2016. - Check lower ext doppler. No diarhea. Start florastor - Cont. IV vanc and fortaz on 9.2.2016, cont to remained febrile. - CXR showed no infiltrate, U/A not compatible with infectiosus etiology.   - check BC x2 Code Status: Full code Family Communication: Spoke with his wife who was present at bedside Disposition Plan: Anticipate patient will require greater than 2 nights hospitalization   Consultants:  Oncology  CT surgery  Procedures:  Ct chest  Antibiotics:  none  HPI/Subjective: Pain well controlled with narcotics. Sweeting  Objective: Filed Vitals:   09/16/14 1327 09/16/14 1830 09/16/14 2040 09/17/14 0535  BP: 115/63  94/60 113/70  Pulse: 77  81 92  Temp: 100.9 F (38.3 C) 100.9 F (38.3 C) 99.6 F (37.6 C) 101.2 F (38.4 C)  TempSrc: Oral Oral Oral Oral  Resp: 17  18 18   Height:      Weight:      SpO2: 94%  96% 95%    Intake/Output Summary (Last 24 hours) at 09/17/14 0810 Last data filed at 09/17/14 0045  Gross per 24 hour  Intake   2410 ml  Output   1000 ml  Net   1410 ml   Filed Weights   09/14/14 1215 09/14/14 1731  Weight: 86.183 kg (190 lb) 88 kg (194 lb 0.1 oz)     Exam:  General: Alert, awake, oriented x3, in no acute distress.  HEENT: No bruits, no goiter.  Heart: Regular rate and rhythm. Lungs: chest tube in place. Abdomen: Soft, nontender, nondistended, positive bowel sounds.  Neuro: Grossly intact, nonfocal.   Data Reviewed: Basic Metabolic Panel:  Recent Labs Lab 09/12/14 1325 09/14/14 1457 09/15/14 0451 09/16/14 0755 09/17/14 0337  NA 136 136 136 133* 133*  K 3.7 3.9 4.2 3.8 4.0  CL  --  104 107 103 101  CO2 21* 24 21* 25 26  GLUCOSE 181* 121* 94 137* 121*  BUN 28.0* 35* 33* 23* 16  CREATININE 1.4* 1.57* 1.44* 1.24 1.19  CALCIUM 8.9 8.5* 7.9* 8.1* 8.1*   Liver Function Tests:  Recent Labs Lab 09/12/14 1325  AST 13  ALT 33  ALKPHOS 79  BILITOT 0.71  PROT 7.1  ALBUMIN 3.6   No results for input(s): LIPASE, AMYLASE in the last 168 hours. No results for input(s): AMMONIA in the last 168 hours. CBC:  Recent Labs Lab 09/12/14 1151 09/14/14 1457 09/15/14 0451 09/16/14 0755 09/17/14 0337  WBC 3.8* 3.0* 3.1* 2.7* 2.7*  HGB 8.3* 7.6* 6.8* 6.8* 6.2*  HCT 24.6* 23.2* 20.4* 20.3* 18.0*  MCV 89 87.5 87.2 86.8 85.3  PLT 37* 32* 27* 32* 23*   Cardiac Enzymes: No results for input(s): CKTOTAL, CKMB, CKMBINDEX, TROPONINI in the last 168 hours. BNP (last 3 results) No results for input(s): BNP in the last 8760 hours.  ProBNP (last 3 results) No results for input(s): PROBNP in the last 8760 hours.  CBG: No results for input(s): GLUCAP in the last 168 hours.  No results found for this or any previous visit (from the past 240 hour(s)).   Studies: Dg Chest Port 1 View  09/16/2014   CLINICAL DATA:  Chest tubes in place.  Pneumothorax.  EXAM: PORTABLE CHEST - 1 VIEW  COMPARISON:  One-view chest x-ray 09/15/2014.  FINDINGS: The heart is mildly enlarged. A left-sided chest tube remains in place. There is no definite residual pneumothorax. Mild pulmonary vascular congestion is present. Mild left basilar airspace disease  likely reflects atelectasis. The visualized soft tissues and bony thorax are unremarkable.  IMPRESSION: 1. Stable positioning of left-sided chest tube without significant residual or recurrent pneumothorax. 2. Borderline cardiomegaly is exaggerated by low lung volumes. 3. Mild bibasilar atelectasis, worse on the left.   Electronically Signed   By: San Morelle M.D.   On: 09/16/2014 08:06   Dg Chest Port 1v Same Day  09/15/2014   CLINICAL DATA:  Follow-up left pneumothorax.  EXAM: PORTABLE CHEST - 1 VIEW SAME DAY  COMPARISON:  Chest radiograph from 1 day prior.  FINDINGS: Left apical chest tube is stable in position. There is a tiny residual lateral apical left pneumothorax, decreased and nearly resolved. No right pneumothorax. No pleural effusion. No pulmonary edema. Mild left basilar atelectasis. Stable cardiomediastinal silhouette with normal heart size.  IMPRESSION: 1. Tiny lateral apical left pneumothorax, decreased and nearly resolved. 2. Mild left basilar atelectasis.   Electronically Signed   By: Ilona Sorrel M.D.   On: 09/15/2014 09:18    Scheduled Meds: . sodium chloride   Intravenous Once  . aspirin EC  81 mg Oral Daily  . cefTAZidime (FORTAZ)  IV  1 g Intravenous 3 times per day  . deferoxamine (DESFERAL) IV  2,000 mg Intravenous Q8H  . furosemide  20 mg Intravenous Once  . magic mouthwash  15 mL Oral TID  . metoprolol tartrate  12.5 mg Oral BID  . pantoprazole  40 mg Oral QHS  . polyethylene glycol  17 g Oral BID  . simvastatin  20 mg Oral QHS  . sodium chloride  3 mL Intravenous Q12H  . vancomycin  750 mg Intravenous Q12H  . varenicline  0.5 mg Oral Daily   Followed by  . [START ON 09/18/2014] varenicline  0.5 mg Oral BID   Followed by  . [START ON 09/22/2014] varenicline  1 mg Oral BID   Continuous Infusions:   Time Spent:15 min   Charlynne Cousins  [facility] Pager [phone]. If 7PM-7AM, please contact night-coverage at [email or web address], password  [facility] 09/17/2014, 8:10 AM  LOS: 3 days

## 2014-09-17 NOTE — Progress Notes (Signed)
CRITICAL VALUE ALERT  Critical value received:  hgb 6.2 and platelets 23  Date of notification:  09/17/14  Time of notification:  9532  Critical value read back:Yes.    Nurse who received alert:  Radene Ou, RN  MD notified (1st page):  On Call  Time of first page:  0450  MD notified (2nd page):  Time of second page:  Responding MD:  On Call  Time MD responded:  NA

## 2014-09-18 ENCOUNTER — Other Ambulatory Visit: Payer: Self-pay | Admitting: Interventional Radiology

## 2014-09-18 ENCOUNTER — Inpatient Hospital Stay (HOSPITAL_COMMUNITY): Payer: Medicare Other

## 2014-09-18 DIAGNOSIS — I82439 Acute embolism and thrombosis of unspecified popliteal vein: Secondary | ICD-10-CM

## 2014-09-18 DIAGNOSIS — I82402 Acute embolism and thrombosis of unspecified deep veins of left lower extremity: Secondary | ICD-10-CM

## 2014-09-18 DIAGNOSIS — D6859 Other primary thrombophilia: Secondary | ICD-10-CM

## 2014-09-18 DIAGNOSIS — D649 Anemia, unspecified: Secondary | ICD-10-CM

## 2014-09-18 DIAGNOSIS — D696 Thrombocytopenia, unspecified: Secondary | ICD-10-CM

## 2014-09-18 LAB — CBC WITH DIFFERENTIAL/PLATELET
BASOS PCT: 0 % (ref 0–1)
Basophils Absolute: 0 10*3/uL (ref 0.0–0.1)
EOS ABS: 0 10*3/uL (ref 0.0–0.7)
EOS PCT: 0 % (ref 0–5)
HCT: 20.9 % — ABNORMAL LOW (ref 39.0–52.0)
HEMOGLOBIN: 7 g/dL — AB (ref 13.0–17.0)
Lymphocytes Relative: 19 % (ref 12–46)
Lymphs Abs: 0.4 10*3/uL — ABNORMAL LOW (ref 0.7–4.0)
MCH: 28.5 pg (ref 26.0–34.0)
MCHC: 33.5 g/dL (ref 30.0–36.0)
MCV: 85 fL (ref 78.0–100.0)
MONOS PCT: 20 % — AB (ref 3–12)
Monocytes Absolute: 0.4 10*3/uL (ref 0.1–1.0)
NEUTROS PCT: 62 % (ref 43–77)
Neutro Abs: 1.4 10*3/uL — ABNORMAL LOW (ref 1.7–7.7)
PLATELETS: 16 10*3/uL — AB (ref 150–400)
RBC: 2.46 MIL/uL — ABNORMAL LOW (ref 4.22–5.81)
RDW: 14.8 % (ref 11.5–15.5)
WBC: 2.2 10*3/uL — ABNORMAL LOW (ref 4.0–10.5)

## 2014-09-18 LAB — BASIC METABOLIC PANEL
Anion gap: 7 (ref 5–15)
BUN: 15 mg/dL (ref 6–20)
CALCIUM: 8.2 mg/dL — AB (ref 8.9–10.3)
CO2: 25 mmol/L (ref 22–32)
CREATININE: 1.23 mg/dL (ref 0.61–1.24)
Chloride: 99 mmol/L — ABNORMAL LOW (ref 101–111)
GFR calc non Af Amer: 59 mL/min — ABNORMAL LOW (ref >60)
Glucose, Bld: 131 mg/dL — ABNORMAL HIGH (ref 65–99)
Potassium: 3.7 mmol/L (ref 3.5–5.1)
SODIUM: 131 mmol/L — AB (ref 135–145)

## 2014-09-18 LAB — PROTIME-INR
INR: 1.27 (ref 0.00–1.49)
PROTHROMBIN TIME: 16.1 s — AB (ref 11.6–15.2)

## 2014-09-18 LAB — PREPARE RBC (CROSSMATCH)

## 2014-09-18 MED ORDER — MIDAZOLAM HCL 2 MG/2ML IJ SOLN
INTRAMUSCULAR | Status: AC
  Filled 2014-09-18: qty 2

## 2014-09-18 MED ORDER — MIDAZOLAM HCL 2 MG/2ML IJ SOLN
INTRAMUSCULAR | Status: AC | PRN
  Administered 2014-09-18: 1 mg via INTRAVENOUS

## 2014-09-18 MED ORDER — FENTANYL CITRATE (PF) 100 MCG/2ML IJ SOLN
INTRAMUSCULAR | Status: AC | PRN
  Administered 2014-09-18: 50 ug via INTRAVENOUS

## 2014-09-18 MED ORDER — FENTANYL CITRATE (PF) 100 MCG/2ML IJ SOLN
INTRAMUSCULAR | Status: AC
  Filled 2014-09-18: qty 2

## 2014-09-18 MED ORDER — IOHEXOL 300 MG/ML  SOLN
100.0000 mL | Freq: Once | INTRAMUSCULAR | Status: DC | PRN
  Administered 2014-09-18: 45 mL via INTRAVENOUS
  Filled 2014-09-18: qty 100

## 2014-09-18 MED ORDER — SODIUM CHLORIDE 0.9 % IV SOLN
INTRAVENOUS | Status: AC | PRN
  Administered 2014-09-18: 75 mL/h via INTRAVENOUS

## 2014-09-18 MED ORDER — DRONABINOL 2.5 MG PO CAPS
5.0000 mg | ORAL_CAPSULE | Freq: Two times a day (BID) | ORAL | Status: DC
  Administered 2014-09-18 – 2014-09-20 (×5): 5 mg via ORAL
  Filled 2014-09-18 (×5): qty 2

## 2014-09-18 MED ORDER — SODIUM CHLORIDE 0.9 % IV SOLN: Freq: Once | INTRAVENOUS | Status: DC

## 2014-09-18 MED ORDER — LIDOCAINE HCL 1 % IJ SOLN
INTRAMUSCULAR | Status: AC
  Filled 2014-09-18: qty 20

## 2014-09-18 NOTE — Progress Notes (Addendum)
[address]       [place] [address]             [phone]          Subjective: Feels fair, pretty weak , conts to have fevers   Objective: Vital signs in last 24 hours: Temp:  [98.2 F (36.8 C)-102.6 F (39.2 C)] 99.6 F (37.6 C) (09/04 0605) Pulse Rate:  [76-86] 82 (09/04 0449) Cardiac Rhythm:  [-] Normal sinus rhythm (09/03 1942) Resp:  [17-18] 18 (09/04 0449) BP: (108-122)/(55-67) 122/67 mmHg (09/04 0449) SpO2:  [95 %-100 %] 97 % (09/04 0449)  Hemodynamic parameters for last 24 hours:    Intake/Output from previous day: 09/03 0701 - 09/04 0700 In: 445 [P.O.:120; Blood:325] Out: 1625 [Urine:1625] Intake/Output this shift:    General appearance: alert, cooperative, fatigued and no distress Heart: regular rate and rhythm and soft systolic murmur Lungs: + scattered exp wheeze Abdomen: soft, nontender, mod distension Extremities: no edema or calf paon Wound: dressings ok  Lab Results:  Recent Labs  09/17/14 0337 09/18/14 0456  WBC 2.7* 2.2*  HGB 6.2* 7.0*  HCT 18.0* 20.9*  PLT 23* 16*   BMET:  Recent Labs  09/17/14 0337 09/18/14 0456  NA 133* 131*  K 4.0 3.7  CL 101 99*  CO2 26 25  GLUCOSE 121* 131*  BUN 16 15  CREATININE 1.19 1.23  CALCIUM 8.1* 8.2*    PT/INR: No results for input(s): LABPROT, INR in the last 72 hours. ABG    Component Value Date/Time   PHART 7.427 07/02/2013 0601   HCO3 23.8 07/02/2013 0601   TCO2 24 07/02/2013 0610   O2SAT 97.8 07/02/2013 0601   CBG (last 3)  No results for input(s): GLUCAP in the last 72 hours.  Meds Scheduled Meds: . sodium chloride   Intravenous Once  . aspirin EC  81 mg Oral Daily  . cefTAZidime (FORTAZ)  IV  1 g Intravenous 3 times per day  . furosemide  20 mg Intravenous Once  . magic mouthwash  15 mL Oral TID  . metoprolol tartrate  12.5 mg Oral BID  . pantoprazole  40 mg Oral QHS  . polyethylene glycol  17 g Oral BID  . saccharomyces boulardii  250 mg Oral BID    . simvastatin  20 mg Oral QHS  . sodium chloride  3 mL Intravenous Q12H  . vancomycin  750 mg Intravenous Q12H  . varenicline  0.5 mg Oral BID   Followed by  . [START ON 09/22/2014] varenicline  1 mg Oral BID   Continuous Infusions:  PRN Meds:.acetaminophen, alum & mag hydroxide-simeth, diphenhydrAMINE, HYDROmorphone, levalbuterol, LORazepam, menthol-cetylpyridinium, ondansetron **OR** ondansetron (ZOFRAN) IV, oxyCODONE  Xrays Dg Chest Port 1 View  09/17/2014   CLINICAL DATA:  Pneumothorax.  Left-sided chest tube.  EXAM: PORTABLE CHEST - 1 VIEW  COMPARISON:  09/16/2014  FINDINGS: There is a left-sided chest tube directed towards the apex. There is no recurrent or residual pneumothorax. There is mild bibasilar atelectasis. There is no focal consolidation. There is no pleural effusion or pneumothorax. The heart and mediastinal contours are unremarkable.  The osseous structures are unremarkable.  IMPRESSION: Left-sided chest tube without a recurrent or residual pneumothorax.   Electronically Signed   By: Kathreen Devoid   On: 09/17/2014 09:13    Assessment/Plan:  1 + air leak, but looks smaller, will check CXR 2 may need further transfusion 3 on broad spectrum abx for fever  4 L popliteal DVT- consider 3 month AC RX if clinically not contraindicated by hematology picture 5 management of medical issues per primary svc and oncology    LOS: 4 days    [name],Darrell E 09/18/2014  Patient seen and examined, agree with above Still has a small air leak, decreases when pressure applied- will wrap tube site with vaseline gauze  Darrell Lipps C. Roxan Hockey, MD [facility] [phone]

## 2014-09-18 NOTE — Evaluation (Signed)
Physical Therapy Evaluation Patient Details Name: [name] MRN: [id number] DOB: 11-Apr-1945 Today's Date: 09/18/2014   History of Present Illness  69 y.o. male admitted to [facility] on 09/14/14 for chest pain.  Found to have large left tension pneumothroax and a chest tube was placed.  Also found to have LE DVT, s/p IVC filter 09/18/14.  Pt has also recieved blood.  Pt with significant PMHx of essential HTN, AAA, ongoing tobacco abuse, normocytic anemia, MDS (myelodysplastic syndrome), MI, depression, cervical fusion, s/p chemotherapy.    Clinical Impression  Pt was able to walk the unit with RW.  Some generalized weakness.  VSS on RA, no DOE with walking.  Some discomfort at his CT site.  I anticipate the more we get him up moving the better he will do.  PT will follow acutely, but I do not anticipate that he will need f/u PT or f/u equipment at discharge.     Follow Up Recommendations No PT follow up    Equipment Recommendations  None recommended by PT    Recommendations for Other Services    NA    Precautions / Restrictions Precautions Precautions: Other (comment) Precaution Comments: chest tube      Mobility  Bed Mobility Overal bed mobility: Needs Assistance Bed Mobility: Supine to Sit     Supine to sit: Min assist     General bed mobility comments: min hand held assist  Transfers Overall transfer level: Needs assistance Equipment used: Rolling walker (2 wheeled) Transfers: Sit to/from Stand Sit to Stand: Min guard         General transfer comment: Pt with reliance on hands for transitions  Ambulation/Gait Ambulation/Gait assistance: Min guard Ambulation Distance (Feet): 300 Feet Assistive device: Rolling walker (2 wheeled) Gait Pattern/deviations: Step-through pattern;Trunk flexed;Shuffle Gait velocity: decreased Gait velocity interpretation: Below normal speed for age/gender General Gait Details: pt with slow, but steady gait pattern.  Took RW with [place] due to  generalized weakness and nicely holds chest tub liquid collector.           Balance Overall balance assessment: Needs assistance Sitting-balance support: Feet supported;No upper extremity supported Sitting balance-Leahy Scale: Good     Standing balance support: Bilateral upper extremity supported;No upper extremity supported;Single extremity supported Standing balance-Leahy Scale: Fair                               Pertinent Vitals/Pain Pain Assessment: 0-10 Pain Score: 7  Pain Location: chest tube site Pain Descriptors / Indicators: Sharp Pain Intervention(s): Limited activity within patient's tolerance;Monitored during session;Repositioned;Patient requesting pain meds-RN notified    Home Living Family/patient expects to be discharged to:: Private residence Living Arrangements: Spouse/significant other Available Help at Discharge: Family;Available 24 hours/day Type of Home: House Home Access: Stairs to enter Entrance Stairs-Rails: None Entrance Stairs-Number of Steps: 3 Home Layout: One level Home Equipment: None      Prior Function Level of Independence: Independent         Comments: pt reports some weakness/illness s/p chemo, but otherwise independent.         Extremity/Trunk Assessment   Upper Extremity Assessment: Overall WFL for tasks assessed           Lower Extremity Assessment: Generalized weakness      Cervical / Trunk Assessment: Other exceptions  Communication   Communication: No difficulties  Cognition Arousal/Alertness: Awake/alert Behavior During Therapy: Flat affect Overall Cognitive Status: Within Functional Limits for tasks  assessed (pt did not say much during session)                      General Comments General comments (skin integrity, edema, etc.): Pt resistant to walking at first, family helpful to get him to go.           Assessment/Plan    PT Assessment Patient needs continued PT services  PT  Diagnosis Difficulty walking;Abnormality of gait;Acute pain;Generalized weakness   PT Problem List Decreased strength;Decreased activity tolerance;Decreased balance;Decreased mobility;Decreased knowledge of use of DME;Cardiopulmonary status limiting activity;Pain  PT Treatment Interventions DME instruction;Gait training;Stair training;Functional mobility training;Therapeutic exercise;Therapeutic activities;Neuromuscular re-education;Balance training;Patient/family education   PT Goals (Current goals can be found in the Care Plan section) Acute Rehab PT Goals Patient Stated Goal: to go home PT Goal Formulation: With patient/family Time For Goal Achievement: 10/02/14 Potential to Achieve Goals: Good    Frequency Min 3X/week    End of Session   Activity Tolerance: Patient limited by fatigue;Patient limited by pain Patient left: in chair;with call bell/phone within reach;with family/visitor present Nurse Communication: Mobility status         Time: 7703-4035 PT Time Calculation (min) (ACUTE ONLY): 26 min   Charges:   PT Evaluation $Initial PT Evaluation Tier I: 1 Procedure PT Treatments $Gait Training: 8-22 mins        Salia Cangemi B. [place], [place], DPT [phone]   09/18/2014, 4:44 PM

## 2014-09-18 NOTE — Sedation Documentation (Signed)
O2 2l/[place]  placed 

## 2014-09-18 NOTE — Consult Note (Signed)
Chief Complaint: Patient was seen in consultation today for IVC filter placement  Referring Physician(s): Ennever,P  History of Present Illness: [name] is a 69 y.o. male with past medical history significant for refractory anemia, myelodysplasia, coronary artery disease with prior stenting, COPD, hypertension, AAA with prior stent grafting, chronic kidney disease, diverticular disease, osteoarthritis and GERD. Patient was recently admitted to the hospital with a spontaneous left pneumothorax and subsequent chest tube placement by thoracic surgery. He has also been recently diagnosed with a partially occlusive left popliteal DVT. Current blood counts include WBC of 2.2, hemoglobin 7.0, platelets 16,000. He is not a good candidate for anticoagulation at this time and request has now been received for IVC filter placement.  Past Medical History  Diagnosis Date  . Coronary atherosclerosis of native coronary artery     a. 10/2008 inf STEMI/PCI: LM 50d (IVUS-borderline lesion->med rx), LAD min irregs, LCX 71m, 70d, OM nl, RCA 160m (3.5x28 Vision BMS);  b. 11/2008 Lexiscan MV: EF 65%, no isch/scar.  . Essential hypertension, benign   . Pure hypercholesterolemia   . AAA (abdominal aortic aneurysm)     a. 05/2013 CT: 5.8 cm AAA.  Marland Kitchen Diverticulitis     a. 05/2013 CT: descending/sigmoid jxn w/o abscess.  . Osteoarthritis   . Tobacco abuse     a. ongoing - 1ppd for better part of 50 yrs.  . Normocytic anemia   . Cellulitis 06/10/2013    Right antecubital fossa at site of IV  03/12/13  . PONV (postoperative nausea and vomiting)   . Diverticulitis   . GERD (gastroesophageal reflux disease)   . MDS (myelodysplastic syndrome), high grade 04/13/2014  . Myocardial infarction 2010  . Depression   . Spontaneous pneumothorax 09/14/2014    LEFT LUNG    Past Surgical History  Procedure Laterality Date  . Cardiac stents  2010  . Cholecystectomy N/A 06/19/2013    Procedure: LAPAROSCOPIC  CHOLECYSTECTOMY;  Surgeon: Harl Bowie, MD;  Location: [place];  Service: General;  Laterality: N/A;  . Abdominal aortic endovascular stent graft N/A 07/02/2013    Procedure: ABDOMINAL AORTIC ENDOVASCULAR STENT GRAFT;  Surgeon: Serafina Mitchell, MD;  [facility];  Service: Vascular;  Laterality: N/A;  . Esophagogastroduodenoscopy N/A 08/05/2013    Procedure: ESOPHAGOGASTRODUODENOSCOPY (EGD);  Surgeon: Lear Ng, MD;  Location: [facility] ENDOSCOPY;  Service: Endoscopy;  Laterality: N/A;  . Eye surgery Left     cataract surgery  . Back surgery      cervical fusion  . Colonoscopy with propofol N/A 10/15/2013    Procedure: COLONOSCOPY WITH PROPOFOL;  Surgeon: Lear Ng, MD;  Location: [place];  Service: Endoscopy;  Laterality: N/A;  . Fetal blood transfusion  March 16,17,18, 2016  . Bone marrow biopsy  April 01, 2014  . Chest tube insertion  09/14/2014    Allergies: Cephalexin; Phenergan; Vicodin; and Percocet  Medications: Prior to Admission medications   Medication Sig Start Date End Date Taking? Authorizing Provider  B Complex Vitamins (VITAMIN-B COMPLEX PO) Take 1 tablet by mouth every morning.    Yes Historical Provider, MD  LORazepam (ATIVAN) 0.5 MG tablet Take 1 tablet (0.5 mg total) by mouth every 6 (six) hours as needed (Nausea or vomiting). 04/11/14  Yes Volanda Napoleon, MD  metoprolol tartrate (LOPRESSOR) 25 MG tablet Take 0.5 tablets (12.5 mg total) by mouth 2 (two) times daily. 08/03/13  Yes Sherren Mocha, MD  nitroGLYCERIN (NITROSTAT) 0.4 MG SL tablet Place 1  tablet (0.4 mg total) under the tongue every 5 (five) minutes as needed for chest pain. 03/29/14  Yes Sherren Mocha, MD  pantoprazole (PROTONIX) 40 MG tablet Take 40 mg by mouth at bedtime.  08/06/13  Yes Nita Sells, MD  predniSONE (DELTASONE) 20 MG tablet Take 2 pills a day for 3 days, then 2 pills a day for 3 days, then 1 pill a day for 3 days, then 1/2 pill a day for 5 days Patient taking  differently: Take 20 mg by mouth daily as needed (for prophylaxis of chemo).  05/09/14  Yes Volanda Napoleon, MD  prochlorperazine (COMPAZINE) 10 MG tablet Take 1 tablet (10 mg total) by mouth every 6 (six) hours as needed (Nausea or vomiting). 04/11/14  Yes Volanda Napoleon, MD  simvastatin (ZOCOR) 20 MG tablet Take 1 tablet (20 mg total) by mouth at bedtime. 03/30/14  Yes Sherren Mocha, MD  temazepam (RESTORIL) 15 MG capsule TAKE 1 CAPSULE BY MOUTH AT BEDTIME AS NEEDED Patient taking differently: TAKE 1 CAPSULE BY MOUTH AT BEDTIME AS NEEDED FOR SLEEP. 08/26/14  Yes Volanda Napoleon, MD  acetaminophen (TYLENOL) 325 MG tablet Take 325 mg by mouth every 6 (six) hours as needed for moderate pain.    Historical Provider, MD  aspirin EC 81 MG tablet Take 1 tablet (81 mg total) by mouth daily. Patient taking differently: Take 81 mg by mouth every morning.  03/30/14   Sherren Mocha, MD  clindamycin (CLEOCIN) 300 MG capsule Take 2 capsules 60 minutes before dental procedure. 05/09/14   Volanda Napoleon, MD  lidocaine-prilocaine (EMLA) cream Apply 1 application topically as needed. 09/13/14   Volanda Napoleon, MD     Family History  Problem Relation Age of Onset  . Heart attack Brother     40s  . Cancer Father     Lung  . Cancer Mother     Brain  . Cancer Brother     Kidney    Social History   Social History  . Marital Status: Married    Spouse Name: N/A  . Number of Children: N/A  . Years of Education: N/A   Social History Main Topics  . Smoking status: Current Every Day Smoker -- 1.00 packs/day for 60 years    Types: Cigarettes    Start date: 04/05/1964  . Smokeless tobacco: Never Used     Comment: 5-23--   STILL SMOKING 09/01 2016  " I am down to 2 or 3 a day "  . Alcohol Use: No  . Drug Use: No  . Sexual Activity: Not Asked   Other Topics Concern  . None   Social History Narrative   Lives in [place] with wife.  Retired Furniture conservator/restorer.  Works out in the yard often - limited by claudication.   Does not routinely exercise.      Review of Systems patient does complain of some left sided chest pain at chest tube site with splinting, occasional night sweats, fevers; denies headache, substernal chest pain, increasing dyspnea, cough, abdominal pain, or abnormal bleeding.  Vital Signs: BP 122/67 mmHg  Pulse 82  Temp(Src) 99.6 F (37.6 C) (Oral)  Resp 18  Ht $R'5\' 10"'Yh$  (1.778 m)  Wt 194 lb 0.1 oz (88 kg)  BMI 27.84 kg/m2  SpO2 97%  Physical Exam patient is awake, alert. Chest-slightly diminished breath sounds at bases, intact left chest tube with positive air leak; heart with regular rate and rhythm. Abdomen soft, positive bowel sounds, nontender; extremities  with full range of motion and no significant edema.       Imaging: Dg Chest 2 View  09/14/2014   CLINICAL DATA:  Pleuritic chest pain and shortness of breath for 2 weeks.  EXAM: CHEST  2 VIEW  COMPARISON:  CT on 09/02/2014  FINDINGS: A large left tension pneumothorax is seen with mild depression of left hemidiaphragm and mediastinal shift to the right. No right-sided pneumothorax visualized. Right lung is clear. Heart size and mediastinal contours are within normal limits.  IMPRESSION: Large left tension pneumothorax.  Critical Value/emergent results were called by telephone at the time of interpretation on 09/14/2014 at 1:11 pm to Dr. Evelina Bucy , who verbally acknowledged these results.   Electronically Signed   By: Earle Gell M.D.   On: 09/14/2014 13:13   Ct Angio Chest Pe W/cm &/or Wo Cm  09/02/2014   CLINICAL DATA:  69 year old with shortness of breath and right lung pain.  EXAM: CT ANGIOGRAPHY CHEST WITH CONTRAST  TECHNIQUE: Multidetector CT imaging of the chest was performed using the standard protocol during bolus administration of intravenous contrast. Multiplanar CT image reconstructions and MIPs were obtained to evaluate the vascular anatomy.  CONTRAST:  160mL OMNIPAQUE IOHEXOL 350 MG/ML SOLN  COMPARISON:  03/30/2014 and  06/07/2013  FINDINGS: Negative for pulmonary embolism. Again noted are prominent and numerous mediastinal lymph nodes. Pretracheal lymph node on sequence 5, image 41 measures 0.8 cm in the short axis and stable. Precarinal lymph node on sequence 5, image 43 measures 1.1 cm and previously measured 1.4 cm. Stable right hilar tissue on sequence 5, image 40 measure 1.2 cm in the short axis. Small amount of fluid in pericardial recess. Lymph node in the anterior mediastinum on sequence 5, image 38 measures 0.8 cm, previously measured 1.1 cm. There is no significant axillary lymphadenopathy. No significant pericardial or pleural fluid. No acute abnormality to the thoracic aorta. Punctate low-density structure along the left hepatic lobe is likely an incidental finding and similar to the previous examination. No acute abnormality in the upper abdomen.  The trachea and mainstem bronchi are patent. Again noted are blebs or small bulla at the lung apices. Stable 5 mm nodule the right lung apex on sequence 7, image 22. Few ground-glass densities along the posterior right lower lobe could represent atelectasis. No large areas of airspace disease or consolidation. New focal pleural-based density along the posterior right lower lobe on sequence 7, image 79 is likely related to focal atelectasis. No acute bone abnormality.  Review of the MIP images confirms the above findings.  IMPRESSION: Negative for pulmonary embolism.  Chronic mediastinal and hilar lymphadenopathy. These findings have minimally changed since 2015. Some of the mediastinal lymph nodes have slightly decreased in size as described.  Few patchy densities along the posterior right lower lobe suggest atelectasis. No significant airspace disease or consolidation.  Stable 5 mm nodule in the right upper lung. Recommend 12 month follow-up CT to ensure greater than 2 years of stability.   Electronically Signed   By: Markus Daft M.D.   On: 09/02/2014 11:17   Dg Chest Port  1 View  09/17/2014   CLINICAL DATA:  Pneumothorax.  Left-sided chest tube.  EXAM: PORTABLE CHEST - 1 VIEW  COMPARISON:  09/16/2014  FINDINGS: There is a left-sided chest tube directed towards the apex. There is no recurrent or residual pneumothorax. There is mild bibasilar atelectasis. There is no focal consolidation. There is no pleural effusion or pneumothorax. The heart and mediastinal  contours are unremarkable.  The osseous structures are unremarkable.  IMPRESSION: Left-sided chest tube without a recurrent or residual pneumothorax.   Electronically Signed   By: Kathreen Devoid   On: 09/17/2014 09:13   Dg Chest Port 1 View  09/16/2014   CLINICAL DATA:  Chest tubes in place.  Pneumothorax.  EXAM: PORTABLE CHEST - 1 VIEW  COMPARISON:  One-view chest x-ray 09/15/2014.  FINDINGS: The heart is mildly enlarged. A left-sided chest tube remains in place. There is no definite residual pneumothorax. Mild pulmonary vascular congestion is present. Mild left basilar airspace disease likely reflects atelectasis. The visualized soft tissues and bony thorax are unremarkable.  IMPRESSION: 1. Stable positioning of left-sided chest tube without significant residual or recurrent pneumothorax. 2. Borderline cardiomegaly is exaggerated by low lung volumes. 3. Mild bibasilar atelectasis, worse on the left.   Electronically Signed   By: San Morelle M.D.   On: 09/16/2014 08:06   Dg Chest Portable 1 View  09/14/2014   CLINICAL DATA:  Chest tube insertion.  EXAM: PORTABLE CHEST - 1 VIEW  COMPARISON:  09/14/2014 at 12:50 p.m.  FINDINGS: Interval placement of left-sided chest tube with tip over the apex. Tiny residual left apical pneumothorax. Lungs are otherwise adequately inflated without consolidation or effusion. Mild stable cardiomegaly. Remainder of the exam is unchanged.  IMPRESSION: Significantly improved left pneumothorax post left chest tube placement as chest tube appears in adequate position. Tiny residual left apical  pneumothorax present.  Stable mild cardiomegaly.   Electronically Signed   By: Marin Olp M.D.   On: 09/14/2014 15:21   Dg Chest Port 1v Same Day  09/15/2014   CLINICAL DATA:  Follow-up left pneumothorax.  EXAM: PORTABLE CHEST - 1 VIEW SAME DAY  COMPARISON:  Chest radiograph from 1 day prior.  FINDINGS: Left apical chest tube is stable in position. There is a tiny residual lateral apical left pneumothorax, decreased and nearly resolved. No right pneumothorax. No pleural effusion. No pulmonary edema. Mild left basilar atelectasis. Stable cardiomediastinal silhouette with normal heart size.  IMPRESSION: 1. Tiny lateral apical left pneumothorax, decreased and nearly resolved. 2. Mild left basilar atelectasis.   Electronically Signed   By: Ilona Sorrel M.D.   On: 09/15/2014 09:18    Labs:  CBC:  Recent Labs  09/15/14 0451 09/16/14 0755 09/17/14 0337 09/18/14 0456  WBC 3.1* 2.7* 2.7* 2.2*  HGB 6.8* 6.8* 6.2* 7.0*  HCT 20.4* 20.3* 18.0* 20.9*  PLT 27* 32* 23* 16*    COAGS:  Recent Labs  03/30/14 1242  INR 1.3*    BMP:  Recent Labs  09/15/14 0451 09/16/14 0755 09/17/14 0337 09/18/14 0456  NA 136 133* 133* 131*  K 4.2 3.8 4.0 3.7  CL 107 103 101 99*  CO2 21* $Remov'25 26 25  'EdDQHG$ GLUCOSE 94 137* 121* 131*  BUN 33* 23* 16 15  CALCIUM 7.9* 8.1* 8.1* 8.2*  CREATININE 1.44* 1.24 1.19 1.23  GFRNONAA 49* 58* >60 59*  GFRAA 57* >60 >60 >60    LIVER FUNCTION TESTS:  Recent Labs  04/06/14 1454  05/26/14 0845 06/06/14 1109 06/21/14 1105 07/11/14 1405 09/12/14 1325  BILITOT 0.5  < > 0.4 0.90 0.6 1.20 0.71  AST 21  < > $R'20 25 15 22 13  'yO$ ALT 18  < > $R'26 29 19 'BX$ 33 33  ALKPHOS 99  < > 81 75 59 60 79  PROT 7.7  < > 7.5 7.7 6.8 7.3 7.1  ALBUMIN 4.6  --  4.0  --  3.8  --  3.6  < > = values in this interval not displayed.  TUMOR MARKERS: No results for input(s): AFPTM, CEA, CA199, CHROMGRNA in the last 8760 hours.  Assessment and Plan: Patient with history of myelodysplasia, refractory  anemia, neutropenia and thrombocytopenia, recent spontaneous left pneumothorax with chest tube placement, acute partially occlusive left popliteal DVT. Patient not ideal anticoagulation candidate. Request now received for IVC filter placement. History and imaging studies have been reviewed by Dr. Laurence Ferrari.Risks and benefits discussed with the patient including, but not limited to bleeding, infection, contrast induced renal failure, filter fracture or migration which can lead to emergency surgery or even death, strut penetration with damage or irritation to adjacent structures and caval thrombosis.All of the patient's questions were answered, patient is agreeable to proceed.Consent signed and in chart. Procedure planned for today.    Thank you for this interesting consult.  I greatly enjoyed meeting [name] and look forward to participating in their care.  A copy of this report was sent to the requesting provider on this date.  Signed: D. Rowe Robert 09/18/2014, 9:22 AM   I spent a total of  20 minutes in face to face in clinical consultation, greater than 50% of which was counseling/coordinating care for IVC filter placement

## 2014-09-18 NOTE — Progress Notes (Signed)
Mr. Haen now has another problem. He now has a small thrombus in the left leg. Despite the fact that he's markedly thrombocytopenic, it is apparent that he is hypercoagulable. He really does not do all that much. He is not eating much. He is not hungry.  Currently, he is on heparin. I think, after much debate, that he probably is not the best candidate for anticoagulation. As such, I think he will need to get a IVC filter placed. I put the order in.  He got 1 unit of blood yesterday. His hemoglobin is 7.  He's still having temperatures. His blood cultures are negative to date. I wonder if this might not be medication related. I stopped the Desferal. It seems as if the temperature started when he started the Chantix. Maybe this can be stopped.  He is not neutropenic. I don't think he needs any gcsf.  He still has a chest tube in. His chest x-ray yesterday did not show any residual pneumothorax. Hopefully, the chest tube can be removed.  He is becoming somewhat despondent. I understand this. He's been through quite a lot. He seems to have complications no matter what happens.  On his physical exam, his vital signs were all stable. Temperature 99.6. Blood pressure 120/67. Pulse is 82. On his exam, there is no oral lesions. Lungs are clear. Cardiac exam regular rate and rhythm. He does not have any murmurs. Abdomen is soft. I cannot palpate any hepatomegaly or splenic megaly. Extremities shows minimal swelling in the left leg. No venous cords noted.  I'll go ahead and get the IVC filter in. I realize supply count is low. He may need to be transfused for the procedure. I will let radiology to determine this one.  I will try him on some Marinol to see this does not help with the appetite.  I probably would try to stop any medication that is not essential. Since cultures are negative right now, maybe antibiotics can be discontinued.  I spent about 40 minutes with he and his wife this morning. I  really feel bad for him considering all that he has to go through.  Darrell E.  Romans 14:11

## 2014-09-18 NOTE — Sedation Documentation (Signed)
O2 d/c'd 

## 2014-09-18 NOTE — Progress Notes (Signed)
[facility] PROGRESS NOTE  Assessment/Plan: Spontaneous pneumothorax: - S/p chest tube placement by CT surgery 8.30.2016. - Further management per surgery. - Continue narcotics and MiraLAX, had BM on 8.2.2016  COPD (chronic obstructive pulmonary disease): - With ongoing tabacco abuse, counseling started chantix.  MDS (myelodysplastic syndrome), high grade: - Hbg now 7.0agree with transfusion. CBC post transfusional and lasix. - Cont to monitor Hbg. Ferritin 744. - cont with chelation therapy. - PLT's 16 will transfuse 1 unit for procedure.  TOBACCO ABUSE: - Counseling with ongoing Tabacoo abuse.  Stage III chronic kidney disease: His creatinine has fluctuated from 1.  Fever due to lower extremity DVT: -  For IVC filter on 9.4.2016, transfuse PLT's and Hbg. - Chest x-ray showed some atelectasis. Fever Improving on heparin. - Discussed with oncology to rule out to proceed with IVC filter. - Get 1 day of IV antibiotics cont to remained febrile. - CXR showed no infiltrate, U/A not compatible with infectiosus etiology. - BC negative.  Code Status: Full code Family Communication: Spoke with his wife who was present at bedside Disposition Plan: Anticipate patient will require greater than 2 nights hospitalization   Consultants:  Oncology  CT surgery  Procedures:  Ct chest  Antibiotics:  none  HPI/Subjective: Pain well controlled with narcotics. Depressed.  Objective: Filed Vitals:   09/17/14 2125 09/18/14 0015 09/18/14 0449 09/18/14 0605  BP:   122/67   Pulse:   82   Temp: 99.9 F (37.7 C) 98.2 F (36.8 C) 100.5 F (38.1 C) 99.6 F (37.6 C)  TempSrc: Oral Oral Oral Oral  Resp:   18   Height:      Weight:      SpO2:   97%     Intake/Output Summary (Last 24 hours) at 09/18/14 1010 Last data filed at 09/18/14 0608  Gross per 24 hour  Intake    445 ml  Output   1625 ml  Net  -1180 ml   Filed Weights   09/14/14 1215 09/14/14 1731  Weight:  86.183 kg (190 lb) 88 kg (194 lb 0.1 oz)    Exam:  General: Alert, awake, oriented x3, in no acute distress.  HEENT: No bruits, no goiter.  Heart: Regular rate and rhythm. Lungs: chest tube in place. Abdomen: Soft, nontender, nondistended, positive bowel sounds.  Neuro: Grossly intact, nonfocal.   Data Reviewed: Basic Metabolic Panel:  Recent Labs Lab 09/14/14 1457 09/15/14 0451 09/16/14 0755 09/17/14 0337 09/18/14 0456  NA 136 136 133* 133* 131*  K 3.9 4.2 3.8 4.0 3.7  CL 104 107 103 101 99*  CO2 24 21* 25 26 25   GLUCOSE 121* 94 137* 121* 131*  BUN 35* 33* 23* 16 15  CREATININE 1.57* 1.44* 1.24 1.19 1.23  CALCIUM 8.5* 7.9* 8.1* 8.1* 8.2*   Liver Function Tests:  Recent Labs Lab 09/12/14 1325  AST 13  ALT 33  ALKPHOS 79  BILITOT 0.71  PROT 7.1  ALBUMIN 3.6   No results for input(s): LIPASE, AMYLASE in the last 168 hours. No results for input(s): AMMONIA in the last 168 hours. CBC:  Recent Labs Lab 09/14/14 1457 09/15/14 0451 09/16/14 0755 09/17/14 0337 09/18/14 0456  WBC 3.0* 3.1* 2.7* 2.7* 2.2*  NEUTROABS  --   --   --   --  1.4*  HGB 7.6* 6.8* 6.8* 6.2* 7.0*  HCT 23.2* 20.4* 20.3* 18.0* 20.9*  MCV 87.5 87.2 86.8 85.3 85.0  PLT 32* 27* 32* 23* 16*   Cardiac Enzymes:  No results for input(s): CKTOTAL, CKMB, CKMBINDEX, TROPONINI in the last 168 hours. BNP (last 3 results) No results for input(s): BNP in the last 8760 hours.  ProBNP (last 3 results) No results for input(s): PROBNP in the last 8760 hours.  CBG: No results for input(s): GLUCAP in the last 168 hours.  Recent Results (from the past 240 hour(s))  Culture, blood (routine x 2)     Status: None (Preliminary result)   Collection Time: 09/16/14  4:00 PM  Result Value Ref Range Status   Specimen Description BLOOD RIGHT ARM  Final   Special Requests BOTTLES DRAWN AEROBIC AND ANAEROBIC 10CC  Final   Culture NO GROWTH < 24 HOURS  Final   Report Status PENDING  Incomplete  Culture, blood  (routine x 2)     Status: None (Preliminary result)   Collection Time: 09/16/14  4:15 PM  Result Value Ref Range Status   Specimen Description BLOOD RIGHT HAND  Final   Special Requests BOTTLES DRAWN AEROBIC AND ANAEROBIC 10CC  Final   Culture NO GROWTH < 24 HOURS  Final   Report Status PENDING  Incomplete     Studies: Dg Chest Port 1 View  09/17/2014   CLINICAL DATA:  Pneumothorax.  Left-sided chest tube.  EXAM: PORTABLE CHEST - 1 VIEW  COMPARISON:  09/16/2014  FINDINGS: There is a left-sided chest tube directed towards the apex. There is no recurrent or residual pneumothorax. There is mild bibasilar atelectasis. There is no focal consolidation. There is no pleural effusion or pneumothorax. The heart and mediastinal contours are unremarkable.  The osseous structures are unremarkable.  IMPRESSION: Left-sided chest tube without a recurrent or residual pneumothorax.   Electronically Signed   By: Kathreen Devoid   On: 09/17/2014 09:13    Scheduled Meds: . sodium chloride   Intravenous Once  . aspirin EC  81 mg Oral Daily  . dronabinol  5 mg Oral BID AC  . furosemide  20 mg Intravenous Once  . magic mouthwash  15 mL Oral TID  . metoprolol tartrate  12.5 mg Oral BID  . pantoprazole  40 mg Oral QHS  . polyethylene glycol  17 g Oral BID  . saccharomyces boulardii  250 mg Oral BID  . simvastatin  20 mg Oral QHS  . sodium chloride  3 mL Intravenous Q12H   Continuous Infusions:   Time Spent:15 min   Charlynne Cousins  [facility] Pager [phone]. If 7PM-7AM, please contact night-coverage at [email or web address], password [facility] 09/18/2014, 10:10 AM  LOS: 4 days

## 2014-09-18 NOTE — Procedures (Signed)
Interventional Radiology Procedure Note  Procedure: Placement IVC filter   Complications: None  Estimated Blood Loss: 0  Recommendations: - Bedrest with HOB elevated x 2 hrs   Signed,  Criselda Peaches, MD

## 2014-09-19 ENCOUNTER — Inpatient Hospital Stay (HOSPITAL_COMMUNITY): Payer: Medicare Other

## 2014-09-19 LAB — PREPARE PLATELET PHERESIS: UNIT DIVISION: 0

## 2014-09-19 LAB — TYPE AND SCREEN
ABO/RH(D): A POS
Antibody Screen: NEGATIVE
UNIT DIVISION: 0
UNIT DIVISION: 0

## 2014-09-19 LAB — BASIC METABOLIC PANEL
Anion gap: 8 (ref 5–15)
BUN: 18 mg/dL (ref 6–20)
CHLORIDE: 101 mmol/L (ref 101–111)
CO2: 23 mmol/L (ref 22–32)
Calcium: 8.2 mg/dL — ABNORMAL LOW (ref 8.9–10.3)
Creatinine, Ser: 1.26 mg/dL — ABNORMAL HIGH (ref 0.61–1.24)
GFR calc Af Amer: >60 mL/min (ref >60)
GFR calc non Af Amer: 57 mL/min — ABNORMAL LOW (ref >60)
Glucose, Bld: 130 mg/dL — ABNORMAL HIGH (ref 65–99)
POTASSIUM: 3.9 mmol/L (ref 3.5–5.1)
SODIUM: 132 mmol/L — AB (ref 135–145)

## 2014-09-19 LAB — CBC
HCT: 21.8 % — ABNORMAL LOW (ref 39.0–52.0)
HEMOGLOBIN: 7.4 g/dL — AB (ref 13.0–17.0)
MCH: 29.1 pg (ref 26.0–34.0)
MCHC: 33.9 g/dL (ref 30.0–36.0)
MCV: 85.8 fL (ref 78.0–100.0)
Platelets: 48 10*3/uL — ABNORMAL LOW (ref 150–400)
RBC: 2.54 MIL/uL — AB (ref 4.22–5.81)
RDW: 14.7 % (ref 11.5–15.5)
WBC: 2.8 10*3/uL — ABNORMAL LOW (ref 4.0–10.5)

## 2014-09-19 MED ORDER — SODIUM CHLORIDE 0.9 % IV SOLN: INTRAVENOUS | Status: DC

## 2014-09-19 MED ORDER — SODIUM CHLORIDE 0.9 % IV SOLN: INTRAVENOUS | Status: AC

## 2014-09-19 NOTE — Progress Notes (Addendum)
      [address]       [place] [address]             [phone]      Subjective:  [name] has no new complaints.  Continues to feel weak.  Having low grade fever  Objective: Vital signs in last 24 hours: Temp:  [98.5 F (36.9 C)-101 F (38.3 C)] 99.9 F (37.7 C) (09/05 0506) Pulse Rate:  [79-90] 80 (09/05 0506) Cardiac Rhythm:  [-] Normal sinus rhythm (09/04 1920) Resp:  [12-18] 18 (09/05 0506) BP: (101-137)/(52-69) 111/62 mmHg (09/05 0506) SpO2:  [94 %-100 %] 96 % (09/05 0506)  Intake/Output from previous day: 09/04 0701 - 09/05 0700 In: 533 [Blood:533] Out: 420 [Urine:400; Chest Tube:20]  General appearance: alert, cooperative and no distress Heart: regular rate and rhythm Lungs: clear to auscultation bilaterally Wound: clean and dry  Lab Results:  Recent Labs  09/18/14 0456 09/19/14 0400  WBC 2.2* 2.8*  HGB 7.0* 7.4*  HCT 20.9* 21.8*  PLT 16* 48*   BMET:  Recent Labs  09/18/14 0456 09/19/14 0400  NA 131* 132*  K 3.7 3.9  CL 99* 101  CO2 25 23  GLUCOSE 131* 130*  BUN 15 18  CREATININE 1.23 1.26*  CALCIUM 8.2* 8.2*    PT/INR:  Recent Labs  09/18/14 0930  LABPROT 16.1*  INR 1.27   ABG    Component Value Date/Time   PHART 7.427 07/02/2013 0601   HCO3 23.8 07/02/2013 0601   TCO2 24 07/02/2013 0610   O2SAT 97.8 07/02/2013 0601   CBG (last 3)  No results for input(s): GLUCAP in the last 72 hours.  Assessment/Plan:  1. Chest tube- questionable 1+ air leak on water seal, CXR now with trace left apical pneumothorax which is better than previous film 2. Dispo- improvement in pneumothorax, questionable 1+ air leak with cough- will discuss management with Dr. Roxan Hockey, repeat CXR in AM   LOS: 5 days    [name], [name] 09/19/2014  Patient seen and examined He does not have any air leak today Will keep tube in one more day- if no air leak tomorrow will dc CT  Remo Lipps C. Roxan Hockey, MD [facility] [phone]

## 2014-09-19 NOTE — Progress Notes (Signed)
[facility] PROGRESS NOTE  Assessment/Plan: Spontaneous pneumothorax: - S/p chest tube placement by CT surgery 8.30.2016. - Further management per surgery. - Continue narcotics and MiraLAX, had BM on 8.2.2016  COPD (chronic obstructive pulmonary disease): - With ongoing tabacco abuse, counseling started chantix.  MDS (myelodysplastic syndrome), high grade: - Hbg now 7.4. - Cont to monitor Hbg. - cont with chelation therapy. - PLT's 48 will transfuse 1 unit for procedure.  TOBACCO ABUSE: - Counseling with ongoing Tabacoo abuse.  Stage III chronic kidney disease: Continues to be stable.  Fever due to lower extremity DVT: -  For IVC filter on 9.4.2016, transfuse PLT's and Hbg. - BC negative.  Code Status: Full code Family Communication: Spoke with his wife who was present at bedside Disposition Plan: Anticipate patient will require greater than 2 nights hospitalization   Consultants:  Oncology  CT surgery  Procedures:  Ct chest  Antibiotics:  none  HPI/Subjective: Pain well controlled with narcotics. Depressed.  Objective: Filed Vitals:   09/18/14 2000 09/18/14 2024 09/18/14 2309 09/19/14 0506  BP: 116/69 107/66 117/59 111/62  Pulse:  81 79 80  Temp:  99.4 F (37.4 C)  99.9 F (37.7 C)  TempSrc:  Oral  Oral  Resp:  18  18  Height:      Weight:      SpO2:  100%  96%    Intake/Output Summary (Last 24 hours) at 09/19/14 0919 Last data filed at 09/19/14 0645  Gross per 24 hour  Intake    533 ml  Output    420 ml  Net    113 ml   Filed Weights   09/14/14 1215 09/14/14 1731  Weight: 86.183 kg (190 lb) 88 kg (194 lb 0.1 oz)    Exam:  General: Alert, awake, oriented x3, in no acute distress.  HEENT: No bruits, no goiter.  Heart: Regular rate and rhythm. Lungs: chest tube in place. Abdomen: Soft, nontender, nondistended, positive bowel sounds.  Neuro: Grossly intact, nonfocal.   Data Reviewed: Basic Metabolic Panel:  Recent  Labs Lab 09/15/14 0451 09/16/14 0755 09/17/14 0337 09/18/14 0456 09/19/14 0400  NA 136 133* 133* 131* 132*  K 4.2 3.8 4.0 3.7 3.9  CL 107 103 101 99* 101  CO2 21* 25 26 25 23   GLUCOSE 94 137* 121* 131* 130*  BUN 33* 23* 16 15 18   CREATININE 1.44* 1.24 1.19 1.23 1.26*  CALCIUM 7.9* 8.1* 8.1* 8.2* 8.2*   Liver Function Tests:  Recent Labs Lab 09/12/14 1325  AST 13  ALT 33  ALKPHOS 79  BILITOT 0.71  PROT 7.1  ALBUMIN 3.6   No results for input(s): LIPASE, AMYLASE in the last 168 hours. No results for input(s): AMMONIA in the last 168 hours. CBC:  Recent Labs Lab 09/15/14 0451 09/16/14 0755 09/17/14 0337 09/18/14 0456 09/19/14 0400  WBC 3.1* 2.7* 2.7* 2.2* 2.8*  NEUTROABS  --   --   --  1.4*  --   HGB 6.8* 6.8* 6.2* 7.0* 7.4*  HCT 20.4* 20.3* 18.0* 20.9* 21.8*  MCV 87.2 86.8 85.3 85.0 85.8  PLT 27* 32* 23* 16* 48*   Cardiac Enzymes: No results for input(s): CKTOTAL, CKMB, CKMBINDEX, TROPONINI in the last 168 hours. BNP (last 3 results) No results for input(s): BNP in the last 8760 hours.  ProBNP (last 3 results) No results for input(s): PROBNP in the last 8760 hours.  CBG: No results for input(s): GLUCAP in the last 168 hours.  Recent Results (from  the past 240 hour(s))  Culture, blood (routine x 2)     Status: None (Preliminary result)   Collection Time: 09/16/14  4:00 PM  Result Value Ref Range Status   Specimen Description BLOOD RIGHT ARM  Final   Special Requests BOTTLES DRAWN AEROBIC AND ANAEROBIC 10CC  Final   Culture NO GROWTH 2 DAYS  Final   Report Status PENDING  Incomplete  Culture, blood (routine x 2)     Status: None (Preliminary result)   Collection Time: 09/16/14  4:15 PM  Result Value Ref Range Status   Specimen Description BLOOD RIGHT HAND  Final   Special Requests BOTTLES DRAWN AEROBIC AND ANAEROBIC 10CC  Final   Culture NO GROWTH 2 DAYS  Final   Report Status PENDING  Incomplete     Studies: Ir Ivc Filter Plmt / S&i /img  Guid/mod Sed  09/18/2014   INDICATION: 69 year old male with a history of myelodysplasia and pancytopenia including severe thrombocytopenia. Despite his low platelet count he has developed acute nonocclusive thrombus in the popliteal artery. Due to anemia and thrombocytopenia this gentleman is a very poor candidate for systemic anticoagulation. Therefore caval interruption with an IVC filter is warranted for PE prophylaxis. Given his medical conditions, long-term prophylaxis may be required.  EXAM: ULTRASOUND GUIDANCE FOR VASCULARACCESS  IVC CATHETERIZATION AND VENOGRAM  IVC FILTER INSERTION  COMPARISON:  None.  MEDICATIONS: Fentanyl 50 mcg IV; Versed 1 mg IV  ANESTHESIA/SEDATION: Sedation Time  8 minutes  CONTRAST:  10mL OMNIPAQUE IOHEXOL 300 MG/ML  SOLN  FLUOROSCOPY TIME:  30 seconds  32.91 mGy  COMPLICATIONS: None immediate  PROCEDURE: Informed consent was obtained from the patient following explanation of the procedure, risks, benefits and alternatives. The patient understands, agrees and consents for the procedure. All questions were addressed. A time out was performed prior to the initiation of the procedure.  Maximal barrier sterile technique utilized including caps, mask, sterile gowns, sterile gloves, large sterile drape, hand hygiene, and Betadine prep.  Under sterile condition and local anesthesia, right internal jugular venous access was performed with ultrasound. An ultrasound image was saved and sent to PACS. Over a guidewire, the IVC filter delivery sheath and inner dilator were advanced into the IVC just above the IVC bifurcation. Contrast injection was performed for an IVC venogram.  Through the delivery sheath, a retrievable Denali IVC filter was deployed below the level of the renal veins and above the IVC bifurcation. Limited post deployment venacavagram was performed.  The delivery sheath was removed and hemostasis was obtained with manual compression. A dressing was placed. The patient  tolerated the procedure well without immediate post procedural complication.  FINDINGS: The IVC is patent. No evidence of thrombus, stenosis, or occlusion. No variant venous anatomy. Successful placement of the IVC filter below the level of the renal veins.  IMPRESSION: Successful ultrasound and fluoroscopically guided placement of an infrarenal retrievable IVC filter via right jugular approach.  This IVC filter is potentially retrievable. The patient will be assessed for filter retrieval by Interventional Radiology in approximately 8-12 weeks. Further recommendations regarding filter retrieval, continued surveillance or declaration of device permanence, will be made at that time.  Signed,  Criselda Peaches, MD  [facility]  [facility]   Electronically Signed   By: Jacqulynn Cadet M.D.   On: 09/18/2014 14:33   Dg Chest Port 1 View  09/19/2014   CLINICAL DATA:  69 year old male with left chest tube for treatment of tension pneumothorax. Initial encounter.  EXAM: PORTABLE CHEST - 1 VIEW  COMPARISON:  09/18/2014 and earlier.  FINDINGS: Portable AP semi upright view at 0644 hours. Stable left chest tube. Small residual pneumothorax is stable, pleural edge visible laterally. Stable lung volumes. Stable cardiac size and mediastinal contours. Mild patchy opacity at both lung bases, mildly increased and more nodular on the right. No edema or pleural effusion identified.  IMPRESSION: 1. Stable left chest tube and residual small left pneumothorax. 2. Increasing patchy and nodular lung base opacity suspicious for superimposed pneumonia.   Electronically Signed   By: Genevie Ann M.D.   On: 09/19/2014 08:20   Dg Chest Port 1 View  09/18/2014   CLINICAL DATA:  Fever last night with chest pain. Ex-smoker. Slight cough. Followup left pneumothorax.  EXAM: PORTABLE CHEST - 1 VIEW  COMPARISON:  Yesterday.  FINDINGS: The left chest tube is unchanged. No pneumothorax seen. The cardiac  silhouette remains near the upper limit of normal in size. Questionable minimal patchy opacity at the right lung base. Small amount of linear density at the left lung base. Unremarkable bones.  IMPRESSION: 1. Questionable minimal pneumonia or patchy atelectasis at the right lung base. 2. Minimal left basilar atelectasis.   Electronically Signed   By: Claudie Revering M.D.   On: 09/18/2014 10:08    Scheduled Meds: . aspirin EC  81 mg Oral Daily  . dronabinol  5 mg Oral BID AC  . magic mouthwash  15 mL Oral TID  . metoprolol tartrate  12.5 mg Oral BID  . pantoprazole  40 mg Oral QHS  . polyethylene glycol  17 g Oral BID  . saccharomyces boulardii  250 mg Oral BID  . simvastatin  20 mg Oral QHS   Continuous Infusions: . sodium chloride 50 mL/hr at 09/19/14 0809    Time Spent:15 min   Charlynne Cousins  [facility] Pager [phone]. If 7PM-7AM, please contact night-coverage at [email or web address], password [email or web address] 09/19/2014, 9:19 AM  LOS: 5 days

## 2014-09-19 NOTE — Progress Notes (Signed)
Mr. Asmar had a good day yesterday. Had a very busy day. He did have the IVC filter placed. He did get a unit of platelets before the procedure. There is no problems with bleeding.  His left leg really is not swollen. He  He is off anticoagulation. He is off antibiotics. Cultures are all negative. His temperature is not as high. This warning his temperature is 99.9.  His labs this morning show white count 2.8. Hemoglobin 7.4. His platelet count is pending.  He is eating better. I started him on some Marinol. There is no nausea.  He did have a little physical therapy yesterday.  A chest x-ray was done today. Results are pending. The chest tube is still in. Hopefully, it will be taken out today and he can go home.  On his physical exam, all of his vital signs are stable. There really is no change in his physical exam. His lungs are clear bilaterally. Cardiac exam regular rate and rhythm with no murmurs, rubs or bruits. Abdomen is soft. There is no palpable liver or spleen tip. There is no fluid wave. He has good bowel sounds. Extremities shows no swelling of the left leg. No venous cord is noted.  Again, hopefully he will be oh to go home today if the chest tube is taken out.  I appreciate the outstanding care that he is gotten from everybody up on [address].  [name] E.  Romans 8:28

## 2014-09-19 NOTE — Care Management Important Message (Signed)
Important Message  Patient Details  Name: [name] MRN: [id number] Date of Birth: 03-Jan-1946   Medicare Important Message Given:  Yes-third notification given    Loann Quill 09/19/2014, 8:50 AM

## 2014-09-20 ENCOUNTER — Inpatient Hospital Stay (HOSPITAL_COMMUNITY): Payer: Medicare Other

## 2014-09-20 DIAGNOSIS — J9383 Other pneumothorax: Secondary | ICD-10-CM

## 2014-09-20 LAB — CBC
HEMATOCRIT: 23.7 % — AB (ref 39.0–52.0)
Hemoglobin: 7.8 g/dL — ABNORMAL LOW (ref 13.0–17.0)
MCH: 28.5 pg (ref 26.0–34.0)
MCHC: 32.9 g/dL (ref 30.0–36.0)
MCV: 86.5 fL (ref 78.0–100.0)
Platelets: 40 10*3/uL — ABNORMAL LOW (ref 150–400)
RBC: 2.74 MIL/uL — ABNORMAL LOW (ref 4.22–5.81)
RDW: 14.9 % (ref 11.5–15.5)
WBC: 2.9 10*3/uL — ABNORMAL LOW (ref 4.0–10.5)

## 2014-09-20 LAB — BASIC METABOLIC PANEL
Anion gap: 8 (ref 5–15)
BUN: 20 mg/dL (ref 6–20)
CO2: 23 mmol/L (ref 22–32)
Calcium: 8.4 mg/dL — ABNORMAL LOW (ref 8.9–10.3)
Chloride: 104 mmol/L (ref 101–111)
Creatinine, Ser: 1.3 mg/dL — ABNORMAL HIGH (ref 0.61–1.24)
GFR calc Af Amer: >60 mL/min (ref >60)
GFR, EST NON AFRICAN AMERICAN: 55 mL/min — AB (ref >60)
GLUCOSE: 124 mg/dL — AB (ref 65–99)
POTASSIUM: 3.8 mmol/L (ref 3.5–5.1)
Sodium: 135 mmol/L (ref 135–145)

## 2014-09-20 MED ORDER — DRONABINOL 5 MG PO CAPS: 5.0000 mg | ORAL_CAPSULE | Freq: Two times a day (BID) | ORAL | Status: DC

## 2014-09-20 MED ORDER — OXYCODONE-ACETAMINOPHEN 7.5-325 MG PO TABS: 1.0000 | ORAL_TABLET | ORAL | Status: DC | PRN

## 2014-09-20 MED ORDER — VARENICLINE TARTRATE 0.5 MG X 11 & 1 MG X 42 PO MISC: ORAL | Status: DC

## 2014-09-20 NOTE — Discharge Summary (Addendum)
Physician Discharge Summary  [name] IZT:[id number] DOB: Apr 04, 1945 DOA: 09/14/2014  PCP: Shirline Frees, MD  Admit date: 09/14/2014 Discharge date: 09/20/2014  Time spent: 35 minutes  Recommendations for Outpatient Follow-up:  1. CT surgery in 2- 4 weeks.   Discharge Diagnoses:  Principal Problem:   Spontaneous pneumothorax Active Problems:   TOBACCO ABUSE   MDS (myelodysplastic syndrome), high grade   Chest pain   COPD (chronic obstructive pulmonary disease)   Pneumothorax   Recurrent fever of unknown cause   DVT (deep venous thrombosis)   Thrombocytopenia   Normocytic anemia   Discharge Condition: stable  Diet recommendation: regular  Filed Weights   09/14/14 1215 09/14/14 1731  Weight: 86.183 kg (190 lb) 88 kg (194 lb 0.1 oz)    History of present illness:  69 year old man with past medical history of MDS, who presents to the emergency room department with left-sided chest pain and shortness of breath chest x-ray was done that shows a left tension pneumothorax.  Hospital Course:  Spontaneous pneumothorax: - CT chest was done that showed a tension pneumothorax CT surgery was consulted who recommended chest tube - S/p chest tube placement by CT surgery 8.30.2016. - Once stop leaking chest tube was removed move, chest x-ray was repeated that showed no further pneumothorax. - Follow-up with CT surgery as an outpatient.  COPD (chronic obstructive pulmonary disease): - With ongoing tabacco abuse, counseling started chantix.  MDS (myelodysplastic syndrome), high grade: - History started to drop so he was transfused 2 units of packed red blood cells. He was also started on patient therapy which he got for 6 days. Ferritin can be repeated in 6 weeks.  TOBACCO ABUSE: - Counseling with ongoing Tabacoo abuse.  Stage III chronic kidney disease: Continues to be stable.  Fever due to lower extremity DVT: - Started developing fevers, blood cultures were checked  which were negative, chest x-ray showed no acute findings urine cultures show no growth. Remain asymptomatic due to her high risk of hypercoagulable state of lower extremity Doppler was done that showed a left lower extremity popliteal DVT due to his platelets being low and his hemoglobin IVC filter was placed on 09/18/2014.   Procedures:  CT chest  IVC filter placement 09/18/2014.  Consultations:  CT surgery  Oncology  Discharge Exam: Filed Vitals:   09/20/14 0330  BP: 114/64  Pulse: 74  Temp: 100 F (37.8 C)  Resp:     General: A&O x3 Cardiovascular: RRR Respiratory: good air movement CTA B/L  Discharge Instructions   Discharge Instructions    Diet - low sodium heart healthy    Complete by:  As directed      Increase activity slowly    Complete by:  As directed           Current Discharge Medication List    START taking these medications   Details  dronabinol (MARINOL) 5 MG capsule Take 1 capsule (5 mg total) by mouth 2 (two) times daily before lunch and supper. Qty: 60 capsule, Refills: 3   Associated Diagnoses: MDS (myelodysplastic syndrome), high grade    varenicline (CHANTIX PAK) 0.5 MG X 11 & 1 MG X 42 tablet Take one 0.5 mg tablet by mouth once daily for 3 days, then increase to one 0.5 mg tablet twice daily for 4 days, then increase to one 1 mg tablet twice daily. Qty: 53 tablet, Refills: 0      CONTINUE these medications which have NOT CHANGED   Details  B Complex Vitamins (VITAMIN-B COMPLEX PO) Take 1 tablet by mouth every morning.     LORazepam (ATIVAN) 0.5 MG tablet Take 1 tablet (0.5 mg total) by mouth every 6 (six) hours as needed (Nausea or vomiting). Qty: 30 tablet, Refills: 3   Associated Diagnoses: Pancytopenia    metoprolol tartrate (LOPRESSOR) 25 MG tablet Take 0.5 tablets (12.5 mg total) by mouth 2 (two) times daily. Qty: 90 tablet, Refills: 3   Associated Diagnoses: Other and unspecified hyperlipidemia    nitroGLYCERIN (NITROSTAT)  0.4 MG SL tablet Place 1 tablet (0.4 mg total) under the tongue every 5 (five) minutes as needed for chest pain. Qty: 25 tablet, Refills: 6    pantoprazole (PROTONIX) 40 MG tablet Take 40 mg by mouth at bedtime.     predniSONE (DELTASONE) 20 MG tablet Take 2 pills a day for 3 days, then 2 pills a day for 3 days, then 1 pill a day for 3 days, then 1/2 pill a day for 5 days Qty: 40 tablet, Refills: 2   Associated Diagnoses: MDS (myelodysplastic syndrome), low grade    prochlorperazine (COMPAZINE) 10 MG tablet Take 1 tablet (10 mg total) by mouth every 6 (six) hours as needed (Nausea or vomiting). Qty: 30 tablet, Refills: 1   Associated Diagnoses: Pancytopenia    simvastatin (ZOCOR) 20 MG tablet Take 1 tablet (20 mg total) by mouth at bedtime. Qty: 30 tablet, Refills: 5   Associated Diagnoses: Pure hypercholesterolemia    temazepam (RESTORIL) 15 MG capsule TAKE 1 CAPSULE BY MOUTH AT BEDTIME AS NEEDED Qty: 30 capsule, Refills: 0    aspirin EC 81 MG tablet Take 1 tablet (81 mg total) by mouth daily.   Associated Diagnoses: Atherosclerosis of native coronary artery of native heart with other form of angina pectoris; Essential hypertension    lidocaine-prilocaine (EMLA) cream Apply 1 application topically as needed. Qty: 30 g, Refills: 0      STOP taking these medications     acetaminophen (TYLENOL) 325 MG tablet      clindamycin (CLEOCIN) 300 MG capsule        Allergies  Allergen Reactions  . Cephalexin Other (See Comments)    Nausea and stomach cramps  . Phenergan [Promethazine Hcl] Other (See Comments)    "restless legs"  . Vicodin [Hydrocodone-Acetaminophen] Itching  . Percocet [Oxycodone-Acetaminophen] Itching   Follow-up Information    Follow up with Melrose Nakayama, MD On 10/04/2014.   Specialty:  Cardiothoracic Surgery   Why:  Appointment is at 9:45   Contact information:   [address] [place] [id number] [phone]       Follow up with   IMAGING On 10/04/2014.   Why:  please get CXR at 9:15, located on first floor of [place] information:   [facility]        The results of significant diagnostics from this hospitalization (including imaging, microbiology, ancillary and laboratory) are listed below for reference.    Significant Diagnostic Studies: Dg Chest 2 View  09/14/2014   CLINICAL DATA:  Pleuritic chest pain and shortness of breath for 2 weeks.  EXAM: CHEST  2 VIEW  COMPARISON:  CT on 09/02/2014  FINDINGS: A large left tension pneumothorax is seen with mild depression of left hemidiaphragm and mediastinal shift to the right. No right-sided pneumothorax visualized. Right lung is clear. Heart size and mediastinal contours are within normal limits.  IMPRESSION: Large left tension pneumothorax.  Critical Value/emergent results were  called by telephone at the time of interpretation on 09/14/2014 at 1:11 pm to Dr. Evelina Bucy , who verbally acknowledged these results.   Electronically Signed   By: Earle Gell M.D.   On: 09/14/2014 13:13   Ct Angio Chest Pe W/cm &/or Wo Cm  09/02/2014   CLINICAL DATA:  69 year old with shortness of breath and right lung pain.  EXAM: CT ANGIOGRAPHY CHEST WITH CONTRAST  TECHNIQUE: Multidetector CT imaging of the chest was performed using the standard protocol during bolus administration of intravenous contrast. Multiplanar CT image reconstructions and MIPs were obtained to evaluate the vascular anatomy.  CONTRAST:  118mL OMNIPAQUE IOHEXOL 350 MG/ML SOLN  COMPARISON:  03/30/2014 and 06/07/2013  FINDINGS: Negative for pulmonary embolism. Again noted are prominent and numerous mediastinal lymph nodes. Pretracheal lymph node on sequence 5, image 41 measures 0.8 cm in the short axis and stable. Precarinal lymph node on sequence 5, image 43 measures 1.1 cm and previously measured 1.4 cm. Stable right hilar tissue on sequence 5, image 40 measure 1.2 cm in the short axis.  Small amount of fluid in pericardial recess. Lymph node in the anterior mediastinum on sequence 5, image 38 measures 0.8 cm, previously measured 1.1 cm. There is no significant axillary lymphadenopathy. No significant pericardial or pleural fluid. No acute abnormality to the thoracic aorta. Punctate low-density structure along the left hepatic lobe is likely an incidental finding and similar to the previous examination. No acute abnormality in the upper abdomen.  The trachea and mainstem bronchi are patent. Again noted are blebs or small bulla at the lung apices. Stable 5 mm nodule the right lung apex on sequence 7, image 22. Few ground-glass densities along the posterior right lower lobe could represent atelectasis. No large areas of airspace disease or consolidation. New focal pleural-based density along the posterior right lower lobe on sequence 7, image 79 is likely related to focal atelectasis. No acute bone abnormality.  Review of the MIP images confirms the above findings.  IMPRESSION: Negative for pulmonary embolism.  Chronic mediastinal and hilar lymphadenopathy. These findings have minimally changed since 2015. Some of the mediastinal lymph nodes have slightly decreased in size as described.  Few patchy densities along the posterior right lower lobe suggest atelectasis. No significant airspace disease or consolidation.  Stable 5 mm nodule in the right upper lung. Recommend 12 month follow-up CT to ensure greater than 2 years of stability.   Electronically Signed   By: Markus Daft M.D.   On: 09/02/2014 11:17   Ir Ivc Filter Plmt / S&i /img Guid/mod Sed  09/18/2014   INDICATION: 69 year old male with a history of myelodysplasia and pancytopenia including severe thrombocytopenia. Despite his low platelet count he has developed acute nonocclusive thrombus in the popliteal artery. Due to anemia and thrombocytopenia this gentleman is a very poor candidate for systemic anticoagulation. Therefore caval  interruption with an IVC filter is warranted for PE prophylaxis. Given his medical conditions, long-term prophylaxis may be required.  EXAM: ULTRASOUND GUIDANCE FOR VASCULARACCESS  IVC CATHETERIZATION AND VENOGRAM  IVC FILTER INSERTION  COMPARISON:  None.  MEDICATIONS: Fentanyl 50 mcg IV; Versed 1 mg IV  ANESTHESIA/SEDATION: Sedation Time  8 minutes  CONTRAST:  110mL OMNIPAQUE IOHEXOL 300 MG/ML  SOLN  FLUOROSCOPY TIME:  30 seconds  25.42 mGy  COMPLICATIONS: None immediate  PROCEDURE: Informed consent was obtained from the patient following explanation of the procedure, risks, benefits and alternatives. The patient understands, agrees and consents for the procedure. All questions were  addressed. A time out was performed prior to the initiation of the procedure.  Maximal barrier sterile technique utilized including caps, mask, sterile gowns, sterile gloves, large sterile drape, hand hygiene, and Betadine prep.  Under sterile condition and local anesthesia, right internal jugular venous access was performed with ultrasound. An ultrasound image was saved and sent to PACS. Over a guidewire, the IVC filter delivery sheath and inner dilator were advanced into the IVC just above the IVC bifurcation. Contrast injection was performed for an IVC venogram.  Through the delivery sheath, a retrievable [place] IVC filter was deployed below the level of the renal veins and above the IVC bifurcation. Limited post deployment venacavagram was performed.  The delivery sheath was removed and hemostasis was obtained with manual compression. A dressing was placed. The patient tolerated the procedure well without immediate post procedural complication.  FINDINGS: The IVC is patent. No evidence of thrombus, stenosis, or occlusion. No variant venous anatomy. Successful placement of the IVC filter below the level of the renal veins.  IMPRESSION: Successful ultrasound and fluoroscopically guided placement of an infrarenal retrievable IVC filter  via right jugular approach.  This IVC filter is potentially retrievable. The patient will be assessed for filter retrieval by Interventional Radiology in approximately 8-12 weeks. Further recommendations regarding filter retrieval, continued surveillance or declaration of device permanence, will be made at that time.  Signed,  Criselda Peaches, MD  [facility]  [facility]   Electronically Signed   By: Jacqulynn Cadet M.D.   On: 09/18/2014 14:33   Dg Chest Port 1 View  09/20/2014   CLINICAL DATA:  Pneumothorax, COPD, myelodysplastic syndrome  EXAM: PORTABLE CHEST - 1 VIEW  COMPARISON:  Portable chest x-ray of September 19, 2014  FINDINGS: The left lung is well-expanded. The tiny apical pneumothorax is smaller today. Is observed today. The chest tube is stable with the tip projecting over the posterior aspect of the left third rib. There is no pleural effusion. The right lung is clear. The heart and pulmonary vascularity are normal. There is tortuosity of the descending thoracic aorta. The bony thorax exhibits no acute abnormality.  IMPRESSION: Tiny residual less than 5% left pneumothorax. The left chest tube is in stable position.   Electronically Signed   By: David  Martinique M.D.   On: 09/20/2014 07:38   Dg Chest Port 1 View  09/19/2014   CLINICAL DATA:  69 year old male with left chest tube for treatment of tension pneumothorax. Initial encounter.  EXAM: PORTABLE CHEST - 1 VIEW  COMPARISON:  09/18/2014 and earlier.  FINDINGS: Portable AP semi upright view at 0644 hours. Stable left chest tube. Small residual pneumothorax is stable, pleural edge visible laterally. Stable lung volumes. Stable cardiac size and mediastinal contours. Mild patchy opacity at both lung bases, mildly increased and more nodular on the right. No edema or pleural effusion identified.  IMPRESSION: 1. Stable left chest tube and residual small left pneumothorax. 2. Increasing patchy and  nodular lung base opacity suspicious for superimposed pneumonia.   Electronically Signed   By: Genevie Ann M.D.   On: 09/19/2014 08:20   Dg Chest Port 1 View  09/18/2014   CLINICAL DATA:  Fever last night with chest pain. Ex-smoker. Slight cough. Followup left pneumothorax.  EXAM: PORTABLE CHEST - 1 VIEW  COMPARISON:  Yesterday.  FINDINGS: The left chest tube is unchanged. No pneumothorax seen. The cardiac silhouette remains near the upper limit of normal in size. Questionable minimal patchy opacity  at the right lung base. Small amount of linear density at the left lung base. Unremarkable bones.  IMPRESSION: 1. Questionable minimal pneumonia or patchy atelectasis at the right lung base. 2. Minimal left basilar atelectasis.   Electronically Signed   By: Claudie Revering M.D.   On: 09/18/2014 10:08   Dg Chest Port 1 View  09/17/2014   CLINICAL DATA:  Pneumothorax.  Left-sided chest tube.  EXAM: PORTABLE CHEST - 1 VIEW  COMPARISON:  09/16/2014  FINDINGS: There is a left-sided chest tube directed towards the apex. There is no recurrent or residual pneumothorax. There is mild bibasilar atelectasis. There is no focal consolidation. There is no pleural effusion or pneumothorax. The heart and mediastinal contours are unremarkable.  The osseous structures are unremarkable.  IMPRESSION: Left-sided chest tube without a recurrent or residual pneumothorax.   Electronically Signed   By: Kathreen Devoid   On: 09/17/2014 09:13   Dg Chest Port 1 View  09/16/2014   CLINICAL DATA:  Chest tubes in place.  Pneumothorax.  EXAM: PORTABLE CHEST - 1 VIEW  COMPARISON:  One-view chest x-ray 09/15/2014.  FINDINGS: The heart is mildly enlarged. A left-sided chest tube remains in place. There is no definite residual pneumothorax. Mild pulmonary vascular congestion is present. Mild left basilar airspace disease likely reflects atelectasis. The visualized soft tissues and bony thorax are unremarkable.  IMPRESSION: 1. Stable positioning of left-sided  chest tube without significant residual or recurrent pneumothorax. 2. Borderline cardiomegaly is exaggerated by low lung volumes. 3. Mild bibasilar atelectasis, worse on the left.   Electronically Signed   By: San Morelle M.D.   On: 09/16/2014 08:06   Dg Chest Portable 1 View  09/14/2014   CLINICAL DATA:  Chest tube insertion.  EXAM: PORTABLE CHEST - 1 VIEW  COMPARISON:  09/14/2014 at 12:50 p.m.  FINDINGS: Interval placement of left-sided chest tube with tip over the apex. Tiny residual left apical pneumothorax. Lungs are otherwise adequately inflated without consolidation or effusion. Mild stable cardiomegaly. Remainder of the exam is unchanged.  IMPRESSION: Significantly improved left pneumothorax post left chest tube placement as chest tube appears in adequate position. Tiny residual left apical pneumothorax present.  Stable mild cardiomegaly.   Electronically Signed   By: Marin Olp M.D.   On: 09/14/2014 15:21   Dg Chest Port 1v Same Day  09/15/2014   CLINICAL DATA:  Follow-up left pneumothorax.  EXAM: PORTABLE CHEST - 1 VIEW SAME DAY  COMPARISON:  Chest radiograph from 1 day prior.  FINDINGS: Left apical chest tube is stable in position. There is a tiny residual lateral apical left pneumothorax, decreased and nearly resolved. No right pneumothorax. No pleural effusion. No pulmonary edema. Mild left basilar atelectasis. Stable cardiomediastinal silhouette with normal heart size.  IMPRESSION: 1. Tiny lateral apical left pneumothorax, decreased and nearly resolved. 2. Mild left basilar atelectasis.   Electronically Signed   By: Ilona Sorrel M.D.   On: 09/15/2014 09:18    Microbiology: Recent Results (from the past 240 hour(s))  Culture, blood (routine x 2)     Status: None (Preliminary result)   Collection Time: 09/16/14  4:00 PM  Result Value Ref Range Status   Specimen Description BLOOD RIGHT ARM  Final   Special Requests BOTTLES DRAWN AEROBIC AND ANAEROBIC 10CC  Final   Culture NO  GROWTH 3 DAYS  Final   Report Status PENDING  Incomplete  Culture, blood (routine x 2)     Status: None (Preliminary result)   Collection  Time: 09/16/14  4:15 PM  Result Value Ref Range Status   Specimen Description BLOOD RIGHT HAND  Final   Special Requests BOTTLES DRAWN AEROBIC AND ANAEROBIC 10CC  Final   Culture NO GROWTH 3 DAYS  Final   Report Status PENDING  Incomplete     Labs: Basic Metabolic Panel:  Recent Labs Lab 09/15/14 0451 09/16/14 0755 09/17/14 0337 09/18/14 0456 09/19/14 0400  NA 136 133* 133* 131* 132*  K 4.2 3.8 4.0 3.7 3.9  CL 107 103 101 99* 101  CO2 21* 25 26 25 23   GLUCOSE 94 137* 121* 131* 130*  BUN 33* 23* 16 15 18   CREATININE 1.44* 1.24 1.19 1.23 1.26*  CALCIUM 7.9* 8.1* 8.1* 8.2* 8.2*   Liver Function Tests: No results for input(s): AST, ALT, ALKPHOS, BILITOT, PROT, ALBUMIN in the last 168 hours. No results for input(s): LIPASE, AMYLASE in the last 168 hours. No results for input(s): AMMONIA in the last 168 hours. CBC:  Recent Labs Lab 09/15/14 0451 09/16/14 0755 09/17/14 0337 09/18/14 0456 09/19/14 0400  WBC 3.1* 2.7* 2.7* 2.2* 2.8*  NEUTROABS  --   --   --  1.4*  --   HGB 6.8* 6.8* 6.2* 7.0* 7.4*  HCT 20.4* 20.3* 18.0* 20.9* 21.8*  MCV 87.2 86.8 85.3 85.0 85.8  PLT 27* 32* 23* 16* 48*   Cardiac Enzymes: No results for input(s): CKTOTAL, CKMB, CKMBINDEX, TROPONINI in the last 168 hours. BNP: BNP (last 3 results) No results for input(s): BNP in the last 8760 hours.  ProBNP (last 3 results) No results for input(s): PROBNP in the last 8760 hours.  CBG: No results for input(s): GLUCAP in the last 168 hours.     Signed:  Charlynne Cousins  [facility] 09/20/2014, 9:29 AM

## 2014-09-20 NOTE — Progress Notes (Signed)
[name] is still the hospital. Hopefully, he will go home today. He had a chest x-ray today. This showed less than 5% pneumothorax on the left.  No labs were done today.  There is no swelling in his legs. No tenderness is noted in the left leg.  He has the IVC filter in place.  He still has had a low-grade temperature. Hopefully, this will improve once the chest tube was taken out. So far, all cultures have been negative.  His appetite is doing okay. He has had no nausea or vomiting. He has had no process going to the bathroom.  He is out of bed a little bit better. His wife is being sure that he does ambulate more.  On his physical exam, his vital signs show temperature 100. Pulse is 74. Blood pressure 114/64. His lungs are clear. Cardiac exam regular rate and rhythm with no murmurs, rubs or bruits. Abdomen is soft. Bowel sounds present. He has no fluid wave. There is no palpable liver or spleen tip. Back exam shows no tenderness over the spine, ribs or hips. Extremities shows no clubbing, cyanosis or edema. Neurological exam is nonfocal.  From what he says, [name] will pull the chest tube today. Hopefully once this is done he will be able to go home.  He needs to be on Marinol for he goes home. This thing will help his appetite.  [name]  Psalm 16:11

## 2014-09-20 NOTE — Care Management Note (Signed)
Case Management Note  Patient Details  Name: [name] MRN: [id number] Date of Birth: 07/30/1945  Subjective/Objective:    Pt admitted with spontaneous pnemothorax                Action/Plan:  Pt is independent from home with wife.     Expected Discharge Date:                  Expected Discharge Plan:  Home/Self Care  In-House Referral:     Discharge planning Services  CM Consult  Post Acute Care Choice:    Choice offered to:     DME Arranged:    DME Agency:     HH Arranged:    [place] Agency:     Status of Service:  Completed, signed off  Medicare Important Message Given:  Yes-third notification given Date Medicare IM Given:    Medicare IM give by:    Date Additional Medicare IM Given:    Additional Medicare Important Message give by:     If discussed at [place] of Stay Meetings, dates discussed:    Additional Comments: Physical therapy recommended Outpt PT, however pt and wife informed CM that they decided that they would like to see how pt does at home first, if additional therapy is needed, pt will have it arranged by PCP.  Wife stated she has arranged to obtain a cane from family.  No CM needs Maryclare Labrador, RN 09/20/2014, 3:30 PM

## 2014-09-20 NOTE — Progress Notes (Signed)
Physical Therapy Treatment Patient Details Name: [name] MRN: [id number] DOB: 12/17/1945 Today's Date: 09/20/2014    History of Present Illness 69 y.o. male admitted to [facility] on 09/14/14 for chest pain.  Found to have large left tension pneumothroax and a chest tube was placed.  Also found to have LE DVT, s/p IVC filter 09/18/14.  Pt has also recieved blood.  Pt with significant PMHx of essential HTN, AAA, ongoing tobacco abuse, normocytic anemia, MDS (myelodysplastic syndrome), MI, depression, cervical fusion, s/p chemotherapy.      PT Comments    Pt progressing towards physical therapy goals. Pt continues to be off balance during standing mobility, especially on stairs. Wife was instructed in safely assisting pt into home, and we discussed the benefit of a SPC until pt is back to baseline. Also discussed follow-up rehab at d/c, and pt/wife are opting to see how pt is progressing at home first, and going to outpatient PT if he is progressing slowly with functional mobility. Pt also complaining of new pain in the L shoulder, which he states is tight and limiting his AROM. Passively we were unable to gain any more range. Pt states RN is aware. Will continue to follow and progress as able per POC.   Follow Up Recommendations  Outpatient PT;Supervision for mobility/OOB     Equipment Recommendations  Cane    Recommendations for Other Services       Precautions / Restrictions Precautions Precautions: Fall Restrictions Weight Bearing Restrictions: No    Mobility  Bed Mobility Overal bed mobility: Needs Assistance Bed Mobility: Supine to Sit     Supine to sit: Min guard     General bed mobility comments: Pt was cued to try as much on his own as he could, as wife was reaching to pull him into sitting. VC's for use of bed rails and sequencing for successful transition to EOB without physical assist.   Transfers Overall transfer level: Needs assistance Equipment used: Rolling walker  (2 wheeled) Transfers: Sit to/from Stand Sit to Stand: Min guard         General transfer comment: Close guard for safety - no physical assist required.   Ambulation/Gait Ambulation/Gait assistance: Min assist Ambulation Distance (Feet): 400 Feet Assistive device: None Gait Pattern/deviations: Step-through pattern;Decreased stride length;Staggering left;Staggering right Gait velocity: decreased Gait velocity interpretation: Below normal speed for age/gender General Gait Details: Occasionally unsteady with small staggers to the L and R. Very minimal assist required to recover.   Stairs Stairs: Yes Stairs assistance: Min assist Stair Management: No rails;One rail Right;Alternating pattern;Step to pattern;Forwards Number of Stairs: 8 (4x2) General stair comments: Pt unsteady on the stairs and had difficulty following instructions for step-to gait pattern instead of step-over. Increased unsteadiness with alternating step pattern.  Wheelchair Mobility    Modified Rankin (Stroke Patients Only)       Balance Overall balance assessment: Needs assistance Sitting-balance support: Feet supported;No upper extremity supported Sitting balance-Leahy Scale: Good     Standing balance support: No upper extremity supported;During functional activity Standing balance-Leahy Scale: Fair Standing balance comment: static and small dynamic movements                    Cognition Arousal/Alertness: Awake/alert Behavior During Therapy: Flat affect Overall Cognitive Status: Within Functional Limits for tasks assessed                      Exercises      General Comments  Pertinent Vitals/Pain Pain Assessment: Faces Faces Pain Scale: Hurts even more Pain Location: L shoulder with AROM Pain Intervention(s): Limited activity within patient's tolerance;Monitored during session;Repositioned    Home Living                      Prior Function             PT Goals (current goals can now be found in the care plan section) Acute Rehab PT Goals Patient Stated Goal: to go home today PT Goal Formulation: With patient/family Time For Goal Achievement: 10/02/14 Potential to Achieve Goals: Good Progress towards PT goals: Progressing toward goals    Frequency  Min 3X/week    PT Plan Current plan remains appropriate    Co-evaluation             End of Session Equipment Utilized During Treatment: Gait belt Activity Tolerance: Patient tolerated treatment well Patient left: in bed;with call bell/phone within reach;with nursing/sitter in room;Other (comment) (Lab present)     Time: 9211-9417 PT Time Calculation (min) (ACUTE ONLY): 24 min  Charges:  $Gait Training: 8-22 mins $Therapeutic Activity: 8-22 mins                    G Codes:      Rolinda Roan 09/30/14, 12:47 PM   Rolinda Roan, PT, DPT Acute Rehabilitation Services Pager: [phone]

## 2014-09-20 NOTE — Progress Notes (Signed)
Left chest tube removed per protocol. Pt tolerated well. Sutures tied, vaseline gauze followed by 4X4 gauze applied. Chest xray to be done in 4 hours.

## 2014-09-20 NOTE — Progress Notes (Addendum)
      [address]       ,[place] [address]             [phone]      Subjective:  Mr. Twedt has no complaints.  He is hoping to get his chest tube out today.    Objective: Vital signs in last 24 hours: Temp:  [99 F (37.2 C)-100 F (37.8 C)] 100 F (37.8 C) (09/06 0330) Pulse Rate:  [74-83] 74 (09/06 0330) Cardiac Rhythm:  [-] Normal sinus rhythm (09/06 0703) Resp:  [17-18] 18 (09/05 2115) BP: (113-114)/(57-64) 114/64 mmHg (09/06 0330) SpO2:  [95 %-97 %] 95 % (09/06 0330)  Intake/Output from previous day: 09/05 0701 - 09/06 0700 In: 360 [P.O.:360] Out: -  Intake/Output this shift: Total I/O In: -  Out: 20 [Chest Tube:20]  General appearance: alert, cooperative and no distress Heart: regular rate and rhythm Lungs: clear to auscultation bilaterally Abdomen: soft, non-tender; bowel sounds normal; no masses,  no organomegaly Wound: clean and dry  Lab Results:  Recent Labs  09/18/14 0456 09/19/14 0400  WBC 2.2* 2.8*  HGB 7.0* 7.4*  HCT 20.9* 21.8*  PLT 16* 48*   BMET:  Recent Labs  09/18/14 0456 09/19/14 0400  NA 131* 132*  K 3.7 3.9  CL 99* 101  CO2 25 23  GLUCOSE 131* 130*  BUN 15 18  CREATININE 1.23 1.26*  CALCIUM 8.2* 8.2*    PT/INR:  Recent Labs  09/18/14 0930  LABPROT 16.1*  INR 1.27   ABG    Component Value Date/Time   PHART 7.427 07/02/2013 0601   HCO3 23.8 07/02/2013 0601   TCO2 24 07/02/2013 0610   O2SAT 97.8 07/02/2013 0601   CBG (last 3)  No results for input(s): GLUCAP in the last 72 hours.  Assessment/Plan:  1. Chest tube- no air leak, CXR with trace pneumothorax- will remove chest tube today.  Repeat CXR at 1:00, if pneumothorax remains stable, would be okay to discharge patient from our standpoint 2. Dispo- no air leak, remove chest tube, repeat CXR at 1:00   LOS: 6 days    [name], ERIN 09/20/2014  Patient seen and examined, agree with above Dc CT  Remo Lipps C. Roxan Hockey, MD [facility] [phone]

## 2014-09-21 LAB — CULTURE, BLOOD (ROUTINE X 2)
CULTURE: NO GROWTH
Culture: NO GROWTH

## 2014-09-21 NOTE — Care Management Note (Signed)
Case Management Note  Patient Details  Name: [name] MRN: [id number] Date of Birth: 26-Dec-1945  Subjective/Objective:    Pt admitted with spontaneous pnemothorax                Action/Plan:  Pt is independent from home with wife.     Expected Discharge Date:                  Expected Discharge Plan:  Home/Self Care  In-House Referral:     Discharge planning Services  CM Consult  Post Acute Care Choice:    Choice offered to:     DME Arranged:    DME Agency:     HH Arranged:    [place] Agency:     Status of Service:  Completed, signed off  Medicare Important Message Given:  Yes-third notification given Date Medicare IM Given:    Medicare IM give by:    Date Additional Medicare IM Given:    Additional Medicare Important Message give by:     If discussed at [place] of Stay Meetings, dates discussed:    Additional Comments: 09/21/14 CM contacted by wife of pt requesting coupon for Chantix due to high copay.  Per wife; cost of drug for patient would be greater than $300.  CM found coupon on line, contacted preferred pharmacy ([facility] in [place]) provide coupon specifics and also faxed actual coupon to pharmacy.  CM contacted wife and informed her that coupon had been seen directly to pharmacy.    09/20/14 Physical therapy recommended Outpt PT, however pt and wife informed CM that they decided that they would like to see how pt does at home first, if additional therapy is needed, pt will have it arranged by PCP.  Wife stated she has arranged to obtain a cane from family.  No CM needs Maryclare Labrador, RN 09/21/2014, 10:29 AM

## 2014-09-22 ENCOUNTER — Other Ambulatory Visit: Payer: Medicare Other

## 2014-09-22 ENCOUNTER — Encounter: Payer: Self-pay | Admitting: Hematology & Oncology

## 2014-09-26 ENCOUNTER — Other Ambulatory Visit: Payer: Medicare Other

## 2014-09-26 ENCOUNTER — Ambulatory Visit: Payer: Medicare Other | Admitting: Hematology & Oncology

## 2014-09-26 ENCOUNTER — Ambulatory Visit: Payer: Medicare Other

## 2014-09-27 ENCOUNTER — Other Ambulatory Visit (HOSPITAL_BASED_OUTPATIENT_CLINIC_OR_DEPARTMENT_OTHER): Payer: Medicare Other

## 2014-09-27 ENCOUNTER — Ambulatory Visit (HOSPITAL_BASED_OUTPATIENT_CLINIC_OR_DEPARTMENT_OTHER): Payer: Medicare Other

## 2014-09-27 ENCOUNTER — Ambulatory Visit: Payer: Medicare Other

## 2014-09-27 VITALS — BP 128/68 | HR 67 | Temp 97.5°F | Resp 98

## 2014-09-27 DIAGNOSIS — D469 Myelodysplastic syndrome, unspecified: Secondary | ICD-10-CM

## 2014-09-27 DIAGNOSIS — D46Z Other myelodysplastic syndromes: Secondary | ICD-10-CM

## 2014-09-27 LAB — CBC WITH DIFFERENTIAL (CANCER CENTER ONLY)
HCT: 22.4 % — ABNORMAL LOW (ref 38.7–49.9)
HGB: 7.4 g/dL — ABNORMAL LOW (ref 13.0–17.1)
MCH: 29.4 pg (ref 28.0–33.4)
MCHC: 33 g/dL (ref 32.0–35.9)
MCV: 89 fL (ref 82–98)
PLATELETS: 23 10*3/uL — AB (ref 145–400)
RBC: 2.52 10*6/uL — AB (ref 4.20–5.70)
RDW: 13.5 % (ref 11.1–15.7)
WBC: 2.9 10*3/uL — ABNORMAL LOW (ref 4.0–10.0)

## 2014-09-27 LAB — MANUAL DIFFERENTIAL (CHCC SATELLITE)
ALC: 0.9 10*3/uL (ref 0.9–3.3)
ANC (CHCC HP manual diff): 1.9 10*3/uL (ref 1.5–6.5)
BAND NEUTROPHILS: 1 % (ref 0–10)
LYMPH: 31 % (ref 14–48)
MONO: 4 % (ref 0–13)
MYELOCYTES: 1 % — AB (ref 0–0)
PLT EST ~~LOC~~: DECREASED
Platelet Morphology: NORMAL
SEG: 63 % (ref 40–75)

## 2014-09-27 LAB — HOLD TUBE, BLOOD BANK - CHCC SATELLITE

## 2014-09-27 MED ORDER — DARBEPOETIN ALFA 300 MCG/0.6ML IJ SOSY
PREFILLED_SYRINGE | INTRAMUSCULAR | Status: AC
  Filled 2014-09-27: qty 0.6

## 2014-09-27 MED ORDER — DARBEPOETIN ALFA 500 MCG/ML IJ SOSY
PREFILLED_SYRINGE | INTRAMUSCULAR | Status: AC
  Filled 2014-09-27: qty 1

## 2014-09-27 MED ORDER — DARBEPOETIN ALFA 500 MCG/ML IJ SOSY
500.0000 ug | PREFILLED_SYRINGE | Freq: Once | INTRAMUSCULAR | Status: AC
  Administered 2014-09-27: 500 ug via SUBCUTANEOUS

## 2014-09-27 NOTE — Patient Instructions (Signed)
Darbepoetin Alfa injection What is this medicine? DARBEPOETIN ALFA (dar be POE e tin AL fa) helps your body make more red blood cells. It is used to treat anemia caused by chronic kidney failure and chemotherapy. This medicine may be used for other purposes; ask your health care provider or pharmacist if you have questions. COMMON BRAND NAME(S): Aranesp What should I tell my health care provider before I take this medicine? They need to know if you have any of these conditions: -blood clotting disorders or history of blood clots -cancer patient not on chemotherapy -cystic fibrosis -heart disease, such as angina, heart failure, or a history of a heart attack -hemoglobin level of 12 g/dL or greater -high blood pressure -low levels of folate, iron, or vitamin B12 -seizures -an unusual or allergic reaction to darbepoetin, erythropoietin, albumin, hamster proteins, latex, other medicines, foods, dyes, or preservatives -pregnant or trying to get pregnant -breast-feeding How should I use this medicine? This medicine is for injection into a vein or under the skin. It is usually given by a health care professional in a hospital or clinic setting. If you get this medicine at home, you will be taught how to prepare and give this medicine. Do not shake the solution before you withdraw a dose. Use exactly as directed. Take your medicine at regular intervals. Do not take your medicine more often than directed. It is important that you put your used needles and syringes in a special sharps container. Do not put them in a trash can. If you do not have a sharps container, call your pharmacist or healthcare provider to get one. Talk to your pediatrician regarding the use of this medicine in children. While this medicine may be used in children as young as 1 year for selected conditions, precautions do apply. Overdosage: If you think you have taken too much of this medicine contact a poison control center or  emergency room at once. NOTE: This medicine is only for you. Do not share this medicine with others. What if I miss a dose? If you miss a dose, take it as soon as you can. If it is almost time for your next dose, take only that dose. Do not take double or extra doses. What may interact with this medicine? Do not take this medicine with any of the following medications: -epoetin alfa This list may not describe all possible interactions. Give your health care provider a list of all the medicines, herbs, non-prescription drugs, or dietary supplements you use. Also tell them if you smoke, drink alcohol, or use illegal drugs. Some items may interact with your medicine. What should I watch for while using this medicine? Visit your prescriber or health care professional for regular checks on your progress and for the needed blood tests and blood pressure measurements. It is especially important for the doctor to make sure your hemoglobin level is in the desired range, to limit the risk of potential side effects and to give you the best benefit. Keep all appointments for any recommended tests. Check your blood pressure as directed. Ask your doctor what your blood pressure should be and when you should contact him or her. As your body makes more red blood cells, you may need to take iron, folic acid, or vitamin B supplements. Ask your doctor or health care provider which products are right for you. If you have kidney disease continue dietary restrictions, even though this medication can make you feel better. Talk with your doctor or health   care professional about the foods you eat and the vitamins that you take. What side effects may I notice from receiving this medicine? Side effects that you should report to your doctor or health care professional as soon as possible: -allergic reactions like skin rash, itching or hives, swelling of the face, lips, or tongue -breathing problems -changes in vision -chest  pain -confusion, trouble speaking or understanding -feeling faint or lightheaded, falls -high blood pressure -muscle aches or pains -pain, swelling, warmth in the leg -rapid weight gain -severe headaches -sudden numbness or weakness of the face, arm or leg -trouble walking, dizziness, loss of balance or coordination -seizures (convulsions) -swelling of the ankles, feet, hands -unusually weak or tired Side effects that usually do not require medical attention (report to your doctor or health care professional if they continue or are bothersome): -diarrhea -fever, chills (flu-like symptoms) -headaches -nausea, vomiting -redness, stinging, or swelling at site where injected This list may not describe all possible side effects. Call your doctor for medical advice about side effects. You may report side effects to FDA at 1-800-FDA-1088. Where should I keep my medicine? Keep out of the reach of children. Store in a refrigerator between 2 and 8 degrees C (36 and 46 degrees F). Do not freeze. Do not shake. Throw away any unused portion if using a single-dose vial. Throw away any unused medicine after the expiration date. NOTE: This sheet is a summary. It may not cover all possible information. If you have questions about this medicine, talk to your doctor, pharmacist, or health care provider.  2015, [employer]. (2007-12-15 10:23:57)  

## 2014-09-28 ENCOUNTER — Ambulatory Visit: Payer: Medicare Other

## 2014-09-29 ENCOUNTER — Ambulatory Visit: Payer: Medicare Other

## 2014-09-30 ENCOUNTER — Ambulatory Visit: Payer: Medicare Other

## 2014-10-03 ENCOUNTER — Other Ambulatory Visit: Payer: Self-pay

## 2014-10-03 ENCOUNTER — Ambulatory Visit: Payer: Medicare Other

## 2014-10-03 ENCOUNTER — Other Ambulatory Visit: Payer: Self-pay | Admitting: Thoracic Surgery (Cardiothoracic Vascular Surgery)

## 2014-10-03 ENCOUNTER — Encounter: Payer: Self-pay | Admitting: Hematology & Oncology

## 2014-10-03 ENCOUNTER — Ambulatory Visit (HOSPITAL_COMMUNITY)
Admission: RE | Admit: 2014-10-03 | Discharge: 2014-10-03 | Disposition: A | Discharge status: Home / Self Care | Payer: Medicare Other | Source: Ambulatory Visit | Attending: Hematology & Oncology | Admitting: Hematology & Oncology

## 2014-10-03 ENCOUNTER — Other Ambulatory Visit (HOSPITAL_BASED_OUTPATIENT_CLINIC_OR_DEPARTMENT_OTHER): Payer: Medicare Other

## 2014-10-03 ENCOUNTER — Other Ambulatory Visit: Payer: Medicare Other

## 2014-10-03 ENCOUNTER — Ambulatory Visit (HOSPITAL_BASED_OUTPATIENT_CLINIC_OR_DEPARTMENT_OTHER): Payer: Medicare Other | Admitting: Hematology & Oncology

## 2014-10-03 VITALS — BP 116/61 | HR 76 | Temp 98.1°F | Resp 16 | Ht 70.0 in | Wt 181.0 lb

## 2014-10-03 DIAGNOSIS — D46Z Other myelodysplastic syndromes: Secondary | ICD-10-CM | POA: Diagnosis present

## 2014-10-03 DIAGNOSIS — E349 Endocrine disorder, unspecified: Secondary | ICD-10-CM

## 2014-10-03 DIAGNOSIS — E291 Testicular hypofunction: Secondary | ICD-10-CM | POA: Diagnosis present

## 2014-10-03 DIAGNOSIS — C9200 Acute myeloblastic leukemia, not having achieved remission: Secondary | ICD-10-CM | POA: Diagnosis present

## 2014-10-03 DIAGNOSIS — F411 Generalized anxiety disorder: Secondary | ICD-10-CM | POA: Diagnosis not present

## 2014-10-03 DIAGNOSIS — J9383 Other pneumothorax: Secondary | ICD-10-CM

## 2014-10-03 LAB — IRON AND TIBC CHCC
%SAT: 46 % (ref 20–55)
IRON: 113 ug/dL (ref 42–163)
TIBC: 245 ug/dL (ref 202–409)
UIBC: 132 ug/dL (ref 117–376)

## 2014-10-03 LAB — CMP (CANCER CENTER ONLY)
ALBUMIN: 3.7 g/dL (ref 3.3–5.5)
ALK PHOS: 72 U/L (ref 26–84)
ALT: 25 U/L (ref 10–47)
AST: 23 U/L (ref 11–38)
BILIRUBIN TOTAL: 0.9 mg/dL (ref 0.20–1.60)
BUN, Bld: 16 mg/dL (ref 7–22)
CALCIUM: 9.3 mg/dL (ref 8.0–10.3)
CO2: 25 mEq/L (ref 18–33)
CREATININE: 1.2 mg/dL (ref 0.6–1.2)
Chloride: 102 mEq/L (ref 98–108)
GLUCOSE: 115 mg/dL (ref 73–118)
Potassium: 3.6 mEq/L (ref 3.3–4.7)
SODIUM: 135 meq/L (ref 128–145)
Total Protein: 7.6 g/dL (ref 6.4–8.1)

## 2014-10-03 LAB — MANUAL DIFFERENTIAL (CHCC SATELLITE)
ALC: 0.5 10*3/uL — AB (ref 0.9–3.3)
ANC (CHCC MAN DIFF): 1.3 10*3/uL — AB (ref 1.5–6.5)
BAND NEUTROPHILS: 2 % (ref 0–10)
BASO: 1 % (ref 0–2)
Eos: 3 % (ref 0–7)
LYMPH: 26 % (ref 14–48)
METAMYELOCYTES PCT: 1 % — AB (ref 0–0)
MONO: 3 % (ref 0–13)
PLT EST ~~LOC~~: DECREASED
SEG: 64 % (ref 40–75)

## 2014-10-03 LAB — CHCC SATELLITE - SMEAR

## 2014-10-03 LAB — CBC WITH DIFFERENTIAL (CANCER CENTER ONLY)
HCT: 20.1 % — ABNORMAL LOW (ref 38.7–49.9)
HGB: 6.5 g/dL — CL (ref 13.0–17.1)
MCH: 28.9 pg (ref 28.0–33.4)
MCHC: 32.3 g/dL (ref 32.0–35.9)
MCV: 89 fL (ref 82–98)
PLATELETS: 37 10*3/uL — AB (ref 145–400)
RBC: 2.25 10*6/uL — ABNORMAL LOW (ref 4.20–5.70)
RDW: 13.6 % (ref 11.1–15.7)
WBC: 2 10*3/uL — ABNORMAL LOW (ref 4.0–10.0)

## 2014-10-03 LAB — HOLD TUBE, BLOOD BANK - CHCC SATELLITE

## 2014-10-03 LAB — FERRITIN CHCC: Ferritin: 2300 ng/ml — ABNORMAL HIGH (ref 22–316)

## 2014-10-03 LAB — TESTOSTERONE: Testosterone: 199 ng/dL — ABNORMAL LOW (ref 300–890)

## 2014-10-03 MED ORDER — ALPRAZOLAM 0.25 MG PO TABS: 0.2500 mg | ORAL_TABLET | Freq: Three times a day (TID) | ORAL | Status: DC | PRN

## 2014-10-03 NOTE — Progress Notes (Signed)
Hematology and Oncology Follow Up Visit  [name] [id number] 01/25/1945 69 y.o. 10/03/2014   Principle Diagnosis:   RAEB-2 (normal cytogenetics with mutated ASXL-1, TET2, U2AF1 genes)  Current Therapy:    Dose intense Vidaza-7 day cycles. To start today  Aranesp 500 g subcutaneous every week needed for hemoglobin less than 10     Interim History:  [name] is back for follow-up. He was [place] was recently. He had a pneumothorax on the left side. I'm not sure how he got the pneumothorax. He was hospitalized for about a week. He was given blood in the hospital. He also was given some Desferal.  He does feel tired. He does not have a lot of energy.  He still has not gotten the Port-A-Cath put in yet. We have been trying to get this taken care of but this pneumothorax got in the way.  He has had no bleeding.  He's had no infections.  His appetite is not that great.  I'm checking a testosterone level on him. I does wonder if his testosterone level is low that might be causing some of the issues that he has. This also might be why he could be having some anemia if his testosterone level is on the low side.  He is had no fever.  His wife is doing a great job with him.  His been no change in bowel or bladder habits.   Overall, his performance status is ECOG 1.  Medications:  Current outpatient prescriptions:  .  aspirin EC 81 MG tablet, Take 1 tablet (81 mg total) by mouth daily. (Patient taking differently: Take 81 mg by mouth every morning. ), Disp: , Rfl:  .  B Complex Vitamins (VITAMIN-B COMPLEX PO), Take 1 tablet by mouth every morning. , Disp: , Rfl:  .  lidocaine-prilocaine (EMLA) cream, Apply 1 application topically as needed., Disp: 30 g, Rfl: 0 .  LORazepam (ATIVAN) 0.5 MG tablet, Take 1 tablet (0.5 mg total) by mouth every 6 (six) hours as needed (Nausea or vomiting)., Disp: 30 tablet, Rfl: 3 .  metoprolol tartrate (LOPRESSOR) 25 MG tablet, Take 0.5 tablets (12.5  mg total) by mouth 2 (two) times daily., Disp: 90 tablet, Rfl: 3 .  nitroGLYCERIN (NITROSTAT) 0.4 MG SL tablet, Place 1 tablet (0.4 mg total) under the tongue every 5 (five) minutes as needed for chest pain., Disp: 25 tablet, Rfl: 6 .  oxyCODONE-acetaminophen (PERCOCET) 7.5-325 MG per tablet, Take 1 tablet by mouth every 4 (four) hours as needed for severe pain., Disp: 30 tablet, Rfl: 0 .  pantoprazole (PROTONIX) 40 MG tablet, Take 40 mg by mouth at bedtime. , Disp: , Rfl:  .  predniSONE (DELTASONE) 20 MG tablet, Take 2 pills a day for 3 days, then 2 pills a day for 3 days, then 1 pill a day for 3 days, then 1/2 pill a day for 5 days (Patient taking differently: Take 20 mg by mouth daily as needed (for prophylaxis of chemo). ), Disp: 40 tablet, Rfl: 2 .  prochlorperazine (COMPAZINE) 10 MG tablet, Take 1 tablet (10 mg total) by mouth every 6 (six) hours as needed (Nausea or vomiting)., Disp: 30 tablet, Rfl: 1 .  simvastatin (ZOCOR) 20 MG tablet, Take 1 tablet (20 mg total) by mouth at bedtime., Disp: 30 tablet, Rfl: 5 .  temazepam (RESTORIL) 15 MG capsule, TAKE 1 CAPSULE BY MOUTH AT BEDTIME AS NEEDED (Patient taking differently: TAKE 1 CAPSULE BY MOUTH AT BEDTIME AS NEEDED FOR SLEEP.),  Disp: 30 capsule, Rfl: 0 .  ALPRAZolam (XANAX) 0.25 MG tablet, Take 1 tablet (0.25 mg total) by mouth 3 (three) times daily as needed for anxiety., Disp: 60 tablet, Rfl: 0  Allergies:  Allergies  Allergen Reactions  . Cephalexin Other (See Comments)    Nausea and stomach cramps  . Phenergan [Promethazine Hcl] Other (See Comments)    "restless legs"  . Vicodin [Hydrocodone-Acetaminophen] Itching  . Percocet [Oxycodone-Acetaminophen] Itching    Past Medical History, Surgical history, Social history, and Family History were reviewed and updated.  Review of Systems: As above  Physical Exam:  height is 5\' 10"  (1.778 m) and weight is 181 lb (82.101 kg). His oral temperature is 98.1 F (36.7 C). His blood pressure  is 116/61 and his pulse is 76. His respiration is 16 and oxygen saturation is 100%.   Wt Readings from Last 3 Encounters:  10/03/14 181 lb (82.101 kg)  09/14/14 194 lb 0.1 oz (88 kg)  09/02/14 195 lb (88.451 kg)     Well-developed and well-nourished white gentleman in no obvious distress. Head and neck exam shows no ocular or oral lesions. There are no palpable cervical or supraclavicular lymph does. Lungs are clear. Cardiac exam regular rate and rhythm with no murmurs, rubs or bruits. Abdomen is soft. Has good bowel sounds. There is no fluid wave. There is no palpable liver or spleen tip. Back exam shows no tenderness over the spine, ribs or hips. Extremities shows no clubbing, cyanosis or edema. Skin exam shows no rashes, ecchymoses or petechia. Neurological exam is nonfocal.  Lab Results  Component Value Date   WBC 2.0* 10/03/2014   HGB 6.5* 10/03/2014   HCT 20.1* 10/03/2014   MCV 89 10/03/2014   PLT 37* 10/03/2014     Chemistry      Component Value Date/Time   NA 135 10/03/2014 0855   NA 135 09/20/2014 1110   NA 136 09/12/2014 1325   K 3.6 10/03/2014 0855   K 3.8 09/20/2014 1110   K 3.7 09/12/2014 1325   CL 102 10/03/2014 0855   CL 104 09/20/2014 1110   CO2 25 10/03/2014 0855   CO2 23 09/20/2014 1110   CO2 21* 09/12/2014 1325   BUN 16 10/03/2014 0855   BUN 20 09/20/2014 1110   BUN 28.0* 09/12/2014 1325   CREATININE 1.2 10/03/2014 0855   CREATININE 1.30* 09/20/2014 1110   CREATININE 1.4* 09/12/2014 1325      Component Value Date/Time   CALCIUM 9.3 10/03/2014 0855   CALCIUM 8.4* 09/20/2014 1110   CALCIUM 8.9 09/12/2014 1325   ALKPHOS 72 10/03/2014 0855   ALKPHOS 79 09/12/2014 1325   ALKPHOS 59 06/21/2014 1105   AST 23 10/03/2014 0855   AST 13 09/12/2014 1325   AST 15 06/21/2014 1105   ALT 25 10/03/2014 0855   ALT 33 09/12/2014 1325   ALT 19 06/21/2014 1105   BILITOT 0.90 10/03/2014 0855   BILITOT 0.71 09/12/2014 1325   BILITOT 0.6 06/21/2014 1105          Impression and Plan: [name] is 69 year old gentleman with myelodysplasia. He has normal cytogenetics. We did do the NGS of his blood which showed some abnormalities. Again, these might indicate an increased risk of transformation but it is not definitive.  I must say, that he clearly has a bone marrow that just is not working right. So far, we have not had much success with getting his anemia improved.  We will go ahead and give  him a transfusion of blood and platelets. I want to try to get the Port-A-Cath put in this week.  We will hold on his chemotherapy until the Port-A-Cath gets placed.  I just feel bad for him. He is doing his best.  Thankfully, his wife is doing fantastic job area did  I will plan to see him back probably in another several weeks. We are checking his labs weekly.  I spent a good half hour with he and his wife today.   Volanda Napoleon, MD 9/19/20163:21 PM

## 2014-10-03 NOTE — Progress Notes (Signed)
Hold Vidaza today per Dr Marin Olp. Patient scheduled for PRBC's and platelets on 10/05/14 and Aranesp.

## 2014-10-04 ENCOUNTER — Other Ambulatory Visit: Payer: Self-pay | Admitting: Hematology & Oncology

## 2014-10-04 ENCOUNTER — Encounter: Payer: Self-pay | Admitting: Thoracic Surgery (Cardiothoracic Vascular Surgery)

## 2014-10-04 ENCOUNTER — Other Ambulatory Visit: Payer: Medicare Other

## 2014-10-04 ENCOUNTER — Ambulatory Visit (INDEPENDENT_AMBULATORY_CARE_PROVIDER_SITE_OTHER): Payer: Medicare Other | Admitting: Thoracic Surgery (Cardiothoracic Vascular Surgery)

## 2014-10-04 ENCOUNTER — Ambulatory Visit: Payer: Medicare Other

## 2014-10-04 ENCOUNTER — Encounter: Payer: Self-pay | Admitting: Hematology & Oncology

## 2014-10-04 ENCOUNTER — Encounter: Payer: Self-pay | Admitting: Cardiovascular Disease

## 2014-10-04 ENCOUNTER — Ambulatory Visit
Admission: RE | Admit: 2014-10-04 | Discharge: 2014-10-04 | Disposition: A | Discharge status: Home / Self Care | Payer: Medicare Other | Source: Ambulatory Visit | Attending: Thoracic Surgery (Cardiothoracic Vascular Surgery) | Admitting: Thoracic Surgery (Cardiothoracic Vascular Surgery)

## 2014-10-04 VITALS — BP 120/73 | HR 68 | Resp 16 | Ht 70.0 in | Wt 181.0 lb

## 2014-10-04 DIAGNOSIS — I739 Peripheral vascular disease, unspecified: Secondary | ICD-10-CM

## 2014-10-04 DIAGNOSIS — Z9289 Personal history of other medical treatment: Secondary | ICD-10-CM

## 2014-10-04 DIAGNOSIS — E349 Endocrine disorder, unspecified: Secondary | ICD-10-CM

## 2014-10-04 DIAGNOSIS — J939 Pneumothorax, unspecified: Secondary | ICD-10-CM

## 2014-10-04 DIAGNOSIS — Z9889 Other specified postprocedural states: Secondary | ICD-10-CM

## 2014-10-04 DIAGNOSIS — J9383 Other pneumothorax: Secondary | ICD-10-CM

## 2014-10-04 LAB — RETICULOCYTES (CHCC)
ABS Retic: 6.8 10*3/uL — ABNORMAL LOW (ref 19.0–186.0)
RBC.: 2.26 MIL/uL — AB (ref 4.22–5.81)
RETIC CT PCT: 0.3 % — AB (ref 0.4–2.3)

## 2014-10-04 NOTE — Progress Notes (Signed)
[address]       [place] [address]             [phone]       HPI:  [name] returns today for a scheduled follow-up visit.  He is a 69 year old man with a history of tobacco abuse who presented on August 31 with a large left pneumothorax. I placed a chest tube in the ER. He had an air leak for several days but we were finally able to get the tube out and he was discharged home after a week.  Since discharge he hasn't had any problems with shortness of breath. He doesn't really have any pain related to the chest tube. He has been very anxious and has had tremors which she attributes to nicotine withdrawal. He has not smoked since he was admitted to the hospital. He tried Chantix but it made him sick. Dr. Marin Olp gave him a prescription for Xanax yesterday.  Past Medical History  Diagnosis Date  . Coronary atherosclerosis of native coronary artery     a. 10/2008 inf STEMI/PCI: LM 50d (IVUS-borderline lesion->med rx), LAD min irregs, LCX 67m, 70d, OM nl, RCA 149m (3.5x28 Vision BMS);  b. 11/2008 Lexiscan MV: EF 65%, no isch/scar.  . Essential hypertension, benign   . Pure hypercholesterolemia   . AAA (abdominal aortic aneurysm)     a. 05/2013 CT: 5.8 cm AAA.  Marland Kitchen Diverticulitis     a. 05/2013 CT: descending/sigmoid jxn w/o abscess.  . Osteoarthritis   . Tobacco abuse     a. ongoing - 1ppd for better part of 50 yrs.  . Normocytic anemia   . Cellulitis 06/10/2013    Right antecubital fossa at site of IV  03/12/13  . PONV (postoperative nausea and vomiting)   . Diverticulitis   . GERD (gastroesophageal reflux disease)   . MDS (myelodysplastic syndrome), high grade 04/13/2014  . Myocardial infarction 2010  . Depression   . Spontaneous pneumothorax 09/14/2014    LEFT LUNG  . Hypotestosteronemia 10/04/2014     Current Outpatient Prescriptions  Medication Sig Dispense Refill  . ALPRAZolam (XANAX) 0.25 MG tablet Take 1 tablet (0.25 mg total) by mouth 3  (three) times daily as needed for anxiety. 60 tablet 0  . aspirin EC 81 MG tablet Take 1 tablet (81 mg total) by mouth daily. (Patient taking differently: Take 81 mg by mouth every morning. )    . B Complex Vitamins (VITAMIN-B COMPLEX PO) Take 1 tablet by mouth every morning.     . lidocaine-prilocaine (EMLA) cream Apply 1 application topically as needed. 30 g 0  . LORazepam (ATIVAN) 0.5 MG tablet Take 1 tablet (0.5 mg total) by mouth every 6 (six) hours as needed (Nausea or vomiting). 30 tablet 3  . metoprolol tartrate (LOPRESSOR) 25 MG tablet Take 0.5 tablets (12.5 mg total) by mouth 2 (two) times daily. 90 tablet 3  . nitroGLYCERIN (NITROSTAT) 0.4 MG SL tablet Place 1 tablet (0.4 mg total) under the tongue every 5 (five) minutes as needed for chest pain. 25 tablet 6  . oxyCODONE-acetaminophen (PERCOCET) 7.5-325 MG per tablet Take 1 tablet by mouth every 4 (four) hours as needed for severe pain. 30 tablet 0  . pantoprazole (PROTONIX) 40 MG tablet Take 40 mg by mouth at bedtime.     . predniSONE (DELTASONE) 20 MG tablet Take 2 pills a day for 3 days, then 2 pills a day for 3 days, then 1 pill  a day for 3 days, then 1/2 pill a day for 5 days (Patient taking differently: Take 20 mg by mouth daily as needed (for prophylaxis of chemo). ) 40 tablet 2  . prochlorperazine (COMPAZINE) 10 MG tablet Take 1 tablet (10 mg total) by mouth every 6 (six) hours as needed (Nausea or vomiting). 30 tablet 1  . simvastatin (ZOCOR) 20 MG tablet Take 1 tablet (20 mg total) by mouth at bedtime. 30 tablet 5  . temazepam (RESTORIL) 15 MG capsule TAKE 1 CAPSULE BY MOUTH AT BEDTIME AS NEEDED (Patient taking differently: TAKE 1 CAPSULE BY MOUTH AT BEDTIME AS NEEDED FOR SLEEP.) 30 capsule 0   No current facility-administered medications for this visit.    Physical Exam BP 120/73 mmHg  Pulse 68  Resp 16  Ht 5\' 10"  (1.778 m)  Wt 181 lb (82.101 kg)  BMI 25.97 kg/m2  SpO101 37% 69 year old man in no acute distress Alert and  oriented 3 with no focal motor deficits, positive resting tremor Lungs clear with equal vessels bilaterally Chest tube site healing well  Diagnostic Tests: I personally reviewed the chest x-ray, it shows no pneumothorax.  Impression: [name] is a 69 year old man with a high-grade myelodysplastic syndrome and COPD, who presented with a left spontaneous pneumothorax on August 31. I placed a chest tube in the emergency room. His air leak take a few days to resolve, but we were finally able to get his chest tube out after about a week. He has not had any further issues since that time.  Smoking cessation instruction/counseling given:  commended patient for quitting and reviewed strategies for preventing relapses   I advised him to avoid any heavy lifting or extreme exertion for the next couple of weeks. Otherwise his activities are unrestricted.  There is some risk that he could have a recurrent pneumothorax, if so he may require surgical correction.  Plan:  I will be happy to see him back any time if I can be of any further assistance with his care.  Melrose Nakayama, MD [facility] [phone]

## 2014-10-05 ENCOUNTER — Other Ambulatory Visit: Payer: Self-pay | Admitting: *Deleted

## 2014-10-05 ENCOUNTER — Ambulatory Visit: Payer: Medicare Other

## 2014-10-05 ENCOUNTER — Other Ambulatory Visit: Payer: Self-pay | Admitting: Radiology

## 2014-10-05 ENCOUNTER — Ambulatory Visit (HOSPITAL_BASED_OUTPATIENT_CLINIC_OR_DEPARTMENT_OTHER): Payer: Medicare Other

## 2014-10-05 VITALS — BP 103/65 | HR 60 | Temp 98.5°F | Resp 18

## 2014-10-05 DIAGNOSIS — C9200 Acute myeloblastic leukemia, not having achieved remission: Secondary | ICD-10-CM | POA: Diagnosis not present

## 2014-10-05 DIAGNOSIS — D469 Myelodysplastic syndrome, unspecified: Secondary | ICD-10-CM | POA: Diagnosis present

## 2014-10-05 DIAGNOSIS — D46Z Other myelodysplastic syndromes: Secondary | ICD-10-CM

## 2014-10-05 DIAGNOSIS — E291 Testicular hypofunction: Secondary | ICD-10-CM

## 2014-10-05 LAB — PREPARE RBC (CROSSMATCH)

## 2014-10-05 MED ORDER — ACETAMINOPHEN 325 MG PO TABS
ORAL_TABLET | ORAL | Status: AC
  Filled 2014-10-05: qty 2

## 2014-10-05 MED ORDER — DIPHENHYDRAMINE HCL 25 MG PO CAPS
25.0000 mg | ORAL_CAPSULE | Freq: Once | ORAL | Status: AC
  Administered 2014-10-05: 25 mg via ORAL

## 2014-10-05 MED ORDER — DARBEPOETIN ALFA 500 MCG/ML IJ SOSY
500.0000 ug | PREFILLED_SYRINGE | Freq: Once | INTRAMUSCULAR | Status: AC
  Administered 2014-10-05: 500 ug via SUBCUTANEOUS

## 2014-10-05 MED ORDER — SODIUM CHLORIDE 0.9 % IV SOLN
250.0000 mL | Freq: Once | INTRAVENOUS | Status: AC
  Administered 2014-10-05: 250 mL via INTRAVENOUS

## 2014-10-05 MED ORDER — TESTOSTERONE CYPIONATE 200 MG/ML IM SOLN
400.0000 mg | INTRAMUSCULAR | Status: DC
  Administered 2014-10-05: 400 mg via INTRAMUSCULAR

## 2014-10-05 MED ORDER — DIPHENHYDRAMINE HCL 25 MG PO CAPS
ORAL_CAPSULE | ORAL | Status: AC
  Filled 2014-10-05: qty 1

## 2014-10-05 MED ORDER — DARBEPOETIN ALFA 500 MCG/ML IJ SOSY
PREFILLED_SYRINGE | INTRAMUSCULAR | Status: AC
  Filled 2014-10-05: qty 1

## 2014-10-05 MED ORDER — ACETAMINOPHEN 325 MG PO TABS
650.0000 mg | ORAL_TABLET | Freq: Once | ORAL | Status: AC
  Administered 2014-10-05: 650 mg via ORAL

## 2014-10-05 MED ORDER — TESTOSTERONE CYPIONATE 200 MG/ML IM SOLN
INTRAMUSCULAR | Status: AC
  Filled 2014-10-05: qty 2

## 2014-10-05 NOTE — Patient Instructions (Signed)
Blood Transfusion Information WHAT IS A BLOOD TRANSFUSION? A transfusion is the replacement of blood or some of its parts. Blood is made up of multiple cells which provide different functions.  Red blood cells carry oxygen and are used for blood loss replacement.  White blood cells fight against infection.  Platelets control bleeding.  Plasma helps clot blood.  Other blood products are available for specialized needs, such as hemophilia or other clotting disorders. BEFORE THE TRANSFUSION  Who gives blood for transfusions?   You may be able to donate blood to be used at a later date on yourself (autologous donation).  Relatives can be asked to donate blood. This is generally not any safer than if you have received blood from a stranger. The same precautions are taken to ensure safety when a relative's blood is donated.  Healthy volunteers who are fully evaluated to make sure their blood is safe. This is blood bank blood. Transfusion therapy is the safest it has ever been in the practice of medicine. Before blood is taken from a donor, a complete history is taken to make sure that person has no history of diseases nor engages in risky social behavior (examples are intravenous drug use or sexual activity with multiple partners). The donor's travel history is screened to minimize risk of transmitting infections, such as malaria. The donated blood is tested for signs of infectious diseases, such as HIV and hepatitis. The blood is then tested to be sure it is compatible with you in order to minimize the chance of a transfusion reaction. If you or a relative donates blood, this is often done in anticipation of surgery and is not appropriate for emergency situations. It takes many days to process the donated blood. RISKS AND COMPLICATIONS Although transfusion therapy is very safe and saves many lives, the main dangers of transfusion include:   Getting an infectious disease.  Developing a  transfusion reaction. This is an allergic reaction to something in the blood you were given. Every precaution is taken to prevent this. The decision to have a blood transfusion has been considered carefully by your caregiver before blood is given. Blood is not given unless the benefits outweigh the risks. AFTER THE TRANSFUSION  Right after receiving a blood transfusion, you will usually feel much better and more energetic. This is especially true if your red blood cells have gotten low (anemic). The transfusion raises the level of the red blood cells which carry oxygen, and this usually causes an energy increase.  The nurse administering the transfusion will monitor you carefully for complications. HOME CARE INSTRUCTIONS  No special instructions are needed after a transfusion. You may find your energy is better. Speak with your caregiver about any limitations on activity for underlying diseases you may have. SEEK MEDICAL CARE IF:   Your condition is not improving after your transfusion.  You develop redness or irritation at the intravenous (IV) site. SEEK IMMEDIATE MEDICAL CARE IF:  Any of the following symptoms occur over the next 12 hours:  Shaking chills.  You have a temperature by mouth above 102 F (38.9 C), not controlled by medicine.  Chest, back, or muscle pain.  People around you feel you are not acting correctly or are confused.  Shortness of breath or difficulty breathing.  Dizziness and fainting.  You get a rash or develop hives.  You have a decrease in urine output.  Your urine turns a dark color or changes to pink, red, or brown. Any of the following   symptoms occur over the next 10 days:  You have a temperature by mouth above 102 F (38.9 C), not controlled by medicine.  Shortness of breath.  Weakness after normal activity.  The white part of the eye turns yellow (jaundice).  You have a decrease in the amount of urine or are urinating less often.  Your  urine turns a dark color or changes to pink, red, or brown. Document Released: 12/29/1999 Document Revised: 03/25/2011 Document Reviewed: 08/17/2007 Exit[employer] Patient Information 2015 [employer]. This information is not intended to replace advice given to you by your health care provider. Make sure you discuss any questions you have with your health care provider.  Platelet Transfusion Information This is information about transfusions of platelets. Platelets are tiny cells made by the bone marrow and found in the blood. When a blood vessel is damaged, platelets rush to the damaged area to help form a clot. This begins the healing process. When platelets get very low, your blood may have trouble clotting. This may be from:  Illness.  Blood disorder.  Chemotherapy to treat cancer. Often, lower platelet counts do not cause problems.  Platelets usually last for 7 to 10 days. If they are not used in an injury, they are broken down by the liver or spleen. Symptoms of low platelet count include:  Nosebleeds.  Bleeding gums.  Heavy periods.  Bruising and tiny blood spots in the skin.  Pinpoint spots of bleeding (petechiae).  Larger bruises (purpura).  Bleeding can be more serious if it happens in the brain or bowel. Platelet transfusions are often used to keep the platelet count at an acceptable level. Serious bleeding due to low platelets is uncommon. RISKS AND COMPLICATIONS Severe side effects from platelet transfusions are uncommon. Minor reactions may include:  Itching.  Rashes.  High temperature and shivering. Medications are available to stop transfusion reactions. Let your health care provider know if you develop any of the above problems.  If you are having platelet transfusions frequently, they may get less effective. This is called becoming refractory to platelets. It is uncommon. This can happen from non-immune causes and immune causes. Non-immune causes  include:  High temperatures.  Some medications.  An enlarged spleen. Immune causes happen when your body discovers the platelets are not your own and begins making antibodies against them. The antibodies kill the platelets quickly. Even with platelet transfusions, you may still notice problems with bleeding or bruising. Let your health care providers know about this. Other things can be done to help if this happens.  BEFORE THE PROCEDURE   Your health care provider will check your platelet count regularly.  If the platelet count is too low, it may be necessary to have a platelet transfusion.  This is more important before certain procedures with a risk of bleeding, such as a spinal tap.  Platelet transfusion reduces the risk of bleeding during or after the procedure.  Except in emergencies, giving a transfusion requires a written consent. Before blood is taken from a donor, a complete history is taken to make sure the person has no history of previous diseases, nor engages in risky social behavior. Examples of this are intravenous drug use or sexual activity with multiple partners. This could lead to infected blood or blood products being used. This history is taken in spite of the extensive testing to make sure the blood is safe. All blood products transfused are tested to make sure it is a match for the person getting   the blood. It is also checked for infections. Blood is the safest it has ever been. The risk of getting an infection is very low. PROCEDURE  The platelets are stored in small plastic bags that are kept at a low temperature.  Each bag is called a unit and sometimes two units are given. They are given through an intravenous line by drip infusion over about one-half hour.  Usually blood is collected from multiple people to get enough to transfuse.  Sometimes, the platelets are collected from a single person. This is done using a special machine that separates the platelets  from the blood. The machine is called an apheresis machine. Platelets collected in this way are called apheresed platelets. Apheresed platelets reduce the risk of becoming sensitive to the platelets. This lowers the chances of having a transfusion reaction.  As it only takes a short time to give the platelets, this treatment can be given in an outpatient department. Platelets can also be given before or after other treatments. SEEK IMMEDIATE MEDICAL CARE IF: You have any of the following symptoms over the next 12 hours or several days:  Shaking chills.  Fever with a temperature greater than 102F (38.9C) develops.  Back pain or muscle pain.  People around you feel you are not acting correctly, or you are confused.  Blood in the urine or bowel movements, or bleeding from any place in your body.  Shortness of breath, or difficulty breathing.  Dizziness.  Fainting.  You break out in a rash or develop hives.  Decrease in the amount of urine you are putting out, or the urine turns a dark color or changes to pink, red, or brown.  A severe headache or stiff neck.  Bruising more easily. Document Released: 10/28/2006 Document Revised: 05/17/2013 Document Reviewed: 10/28/2006 Exit[employer] Patient Information 2015 [employer]. This information is not intended to replace advice given to you by your health care provider. Make sure you discuss any questions you have with your health care provider.  

## 2014-10-06 ENCOUNTER — Ambulatory Visit: Payer: Medicare Other

## 2014-10-06 ENCOUNTER — Ambulatory Visit (HOSPITAL_COMMUNITY)
Admission: RE | Admit: 2014-10-06 | Discharge: 2014-10-06 | Disposition: A | Discharge status: Home / Self Care | Payer: Medicare Other | Source: Ambulatory Visit | Attending: Hematology & Oncology | Admitting: Hematology & Oncology

## 2014-10-06 ENCOUNTER — Other Ambulatory Visit: Payer: Self-pay | Admitting: Hematology & Oncology

## 2014-10-06 ENCOUNTER — Encounter (HOSPITAL_COMMUNITY): Payer: Self-pay

## 2014-10-06 DIAGNOSIS — Z981 Arthrodesis status: Secondary | ICD-10-CM

## 2014-10-06 DIAGNOSIS — K219 Gastro-esophageal reflux disease without esophagitis: Secondary | ICD-10-CM | POA: Diagnosis present

## 2014-10-06 DIAGNOSIS — E349 Endocrine disorder, unspecified: Secondary | ICD-10-CM

## 2014-10-06 DIAGNOSIS — I739 Peripheral vascular disease, unspecified: Secondary | ICD-10-CM | POA: Diagnosis present

## 2014-10-06 DIAGNOSIS — Z79891 Long term (current) use of opiate analgesic: Secondary | ICD-10-CM

## 2014-10-06 DIAGNOSIS — D46Z Other myelodysplastic syndromes: Secondary | ICD-10-CM

## 2014-10-06 DIAGNOSIS — T450X5A Adverse effect of antiallergic and antiemetic drugs, initial encounter: Secondary | ICD-10-CM | POA: Diagnosis not present

## 2014-10-06 DIAGNOSIS — E785 Hyperlipidemia, unspecified: Secondary | ICD-10-CM | POA: Diagnosis present

## 2014-10-06 DIAGNOSIS — Z7982 Long term (current) use of aspirin: Secondary | ICD-10-CM

## 2014-10-06 DIAGNOSIS — Z87891 Personal history of nicotine dependence: Secondary | ICD-10-CM

## 2014-10-06 DIAGNOSIS — J9383 Other pneumothorax: Principal | ICD-10-CM | POA: Diagnosis present

## 2014-10-06 DIAGNOSIS — F329 Major depressive disorder, single episode, unspecified: Secondary | ICD-10-CM | POA: Diagnosis present

## 2014-10-06 DIAGNOSIS — I2510 Atherosclerotic heart disease of native coronary artery without angina pectoris: Secondary | ICD-10-CM | POA: Diagnosis present

## 2014-10-06 DIAGNOSIS — I252 Old myocardial infarction: Secondary | ICD-10-CM

## 2014-10-06 DIAGNOSIS — D4622 Refractory anemia with excess of blasts 2: Secondary | ICD-10-CM | POA: Diagnosis present

## 2014-10-06 DIAGNOSIS — Z955 Presence of coronary angioplasty implant and graft: Secondary | ICD-10-CM

## 2014-10-06 DIAGNOSIS — D61818 Other pancytopenia: Secondary | ICD-10-CM | POA: Insufficient documentation

## 2014-10-06 DIAGNOSIS — D696 Thrombocytopenia, unspecified: Secondary | ICD-10-CM | POA: Diagnosis present

## 2014-10-06 DIAGNOSIS — Z95828 Presence of other vascular implants and grafts: Secondary | ICD-10-CM

## 2014-10-06 DIAGNOSIS — Z885 Allergy status to narcotic agent status: Secondary | ICD-10-CM

## 2014-10-06 DIAGNOSIS — D469 Myelodysplastic syndrome, unspecified: Secondary | ICD-10-CM | POA: Insufficient documentation

## 2014-10-06 DIAGNOSIS — R251 Tremor, unspecified: Secondary | ICD-10-CM | POA: Diagnosis not present

## 2014-10-06 DIAGNOSIS — J449 Chronic obstructive pulmonary disease, unspecified: Secondary | ICD-10-CM | POA: Diagnosis present

## 2014-10-06 DIAGNOSIS — Z809 Family history of malignant neoplasm, unspecified: Secondary | ICD-10-CM

## 2014-10-06 DIAGNOSIS — Z86718 Personal history of other venous thrombosis and embolism: Secondary | ICD-10-CM

## 2014-10-06 DIAGNOSIS — Z888 Allergy status to other drugs, medicaments and biological substances status: Secondary | ICD-10-CM

## 2014-10-06 DIAGNOSIS — F411 Generalized anxiety disorder: Secondary | ICD-10-CM

## 2014-10-06 DIAGNOSIS — R0602 Shortness of breath: Secondary | ICD-10-CM | POA: Diagnosis not present

## 2014-10-06 DIAGNOSIS — R112 Nausea with vomiting, unspecified: Secondary | ICD-10-CM | POA: Diagnosis present

## 2014-10-06 DIAGNOSIS — Z881 Allergy status to other antibiotic agents status: Secondary | ICD-10-CM

## 2014-10-06 DIAGNOSIS — Z79899 Other long term (current) drug therapy: Secondary | ICD-10-CM

## 2014-10-06 DIAGNOSIS — E7800 Pure hypercholesterolemia, unspecified: Secondary | ICD-10-CM | POA: Diagnosis present

## 2014-10-06 DIAGNOSIS — Z8249 Family history of ischemic heart disease and other diseases of the circulatory system: Secondary | ICD-10-CM

## 2014-10-06 DIAGNOSIS — I10 Essential (primary) hypertension: Secondary | ICD-10-CM | POA: Diagnosis present

## 2014-10-06 LAB — CBC WITH DIFFERENTIAL/PLATELET
BASOS PCT: 0 %
Basophils Absolute: 0 10*3/uL (ref 0.0–0.1)
EOS ABS: 0 10*3/uL (ref 0.0–0.7)
EOS PCT: 1 %
HEMATOCRIT: 17.6 % — AB (ref 39.0–52.0)
Hemoglobin: 5.9 g/dL — CL (ref 13.0–17.0)
Lymphocytes Relative: 27 %
Lymphs Abs: 0.4 10*3/uL — ABNORMAL LOW (ref 0.7–4.0)
MCH: 29.1 pg (ref 26.0–34.0)
MCHC: 33.5 g/dL (ref 30.0–36.0)
MCV: 86.7 fL (ref 78.0–100.0)
MONO ABS: 0.1 10*3/uL (ref 0.1–1.0)
Monocytes Relative: 8 %
NEUTROS ABS: 1 10*3/uL — AB (ref 1.7–7.7)
Neutrophils Relative %: 64 %
PLATELETS: 59 10*3/uL — AB (ref 150–400)
RBC: 2.03 MIL/uL — ABNORMAL LOW (ref 4.22–5.81)
RDW: 13.9 % (ref 11.5–15.5)
WBC: 1.5 10*3/uL — ABNORMAL LOW (ref 4.0–10.5)

## 2014-10-06 LAB — PREPARE PLATELET PHERESIS: Unit division: 0

## 2014-10-06 LAB — CBC
HEMATOCRIT: 19.2 % — AB (ref 39.0–52.0)
Hemoglobin: 6.5 g/dL — CL (ref 13.0–17.0)
MCH: 29.3 pg (ref 26.0–34.0)
MCHC: 33.9 g/dL (ref 30.0–36.0)
MCV: 86.5 fL (ref 78.0–100.0)
PLATELETS: 50 10*3/uL — AB (ref 150–400)
RBC: 2.22 MIL/uL — AB (ref 4.22–5.81)
RDW: 14 % (ref 11.5–15.5)
WBC: 1.6 10*3/uL — ABNORMAL LOW (ref 4.0–10.5)

## 2014-10-06 LAB — PROTIME-INR
INR: 1.26 (ref 0.00–1.49)
Prothrombin Time: 16 seconds — ABNORMAL HIGH (ref 11.6–15.2)

## 2014-10-06 LAB — APTT: aPTT: 39 seconds — ABNORMAL HIGH (ref 24–37)

## 2014-10-06 MED ORDER — HEPARIN SOD (PORK) LOCK FLUSH 100 UNIT/ML IV SOLN
INTRAVENOUS | Status: AC
  Filled 2014-10-06: qty 5

## 2014-10-06 MED ORDER — LIDOCAINE-EPINEPHRINE 2 %-1:100000 IJ SOLN
INTRAMUSCULAR | Status: AC
  Filled 2014-10-06: qty 1

## 2014-10-06 MED ORDER — MIDAZOLAM HCL 2 MG/2ML IJ SOLN
INTRAMUSCULAR | Status: AC
  Filled 2014-10-06: qty 6

## 2014-10-06 MED ORDER — CEFAZOLIN SODIUM-DEXTROSE 2-3 GM-% IV SOLR
INTRAVENOUS | Status: AC
  Filled 2014-10-06: qty 50

## 2014-10-06 MED ORDER — LIDOCAINE HCL 1 % IJ SOLN
INTRAMUSCULAR | Status: AC
  Filled 2014-10-06: qty 20

## 2014-10-06 MED ORDER — FENTANYL CITRATE (PF) 100 MCG/2ML IJ SOLN
INTRAMUSCULAR | Status: AC | PRN
  Administered 2014-10-06 (×2): 25 ug via INTRAVENOUS
  Administered 2014-10-06: 50 ug via INTRAVENOUS

## 2014-10-06 MED ORDER — FENTANYL CITRATE (PF) 100 MCG/2ML IJ SOLN
INTRAMUSCULAR | Status: AC
  Filled 2014-10-06: qty 4

## 2014-10-06 MED ORDER — CEFAZOLIN SODIUM-DEXTROSE 2-3 GM-% IV SOLR
2.0000 g | Freq: Once | INTRAVENOUS | Status: AC
  Administered 2014-10-06: 2 g via INTRAVENOUS

## 2014-10-06 MED ORDER — MIDAZOLAM HCL 2 MG/2ML IJ SOLN
INTRAMUSCULAR | Status: AC | PRN
  Administered 2014-10-06 (×5): 1 mg via INTRAVENOUS

## 2014-10-06 MED ORDER — SODIUM CHLORIDE 0.9 % IV SOLN
INTRAVENOUS | Status: DC
  Administered 2014-10-06: 11:00:00 via INTRAVENOUS

## 2014-10-06 MED ORDER — HEPARIN SOD (PORK) LOCK FLUSH 100 UNIT/ML IV SOLN
INTRAVENOUS | Status: AC | PRN
  Administered 2014-10-06: 500 [IU]

## 2014-10-06 NOTE — Discharge Instructions (Signed)
Implanted Port Insertion, Care After °Refer to this sheet in the next few weeks. These instructions provide you with information on caring for yourself after your procedure. Your health care provider may also give you more specific instructions. Your treatment has been planned according to current medical practices, but problems sometimes occur. Call your health care provider if you have any problems or questions after your procedure. °WHAT TO EXPECT AFTER THE PROCEDURE °After your procedure, it is typical to have the following:  °· Discomfort at the port insertion site. Ice packs to the area will help. °· Bruising on the skin over the port. This will subside in 3-4 days. °HOME CARE INSTRUCTIONS °· After your port is placed, you will get a manufacturer's information card. The card has information about your port. Keep this card with you at all times.   °· Know what kind of port you have. There are many types of ports available.   °· Wear a medical alert bracelet in case of an emergency. This can help alert health care workers that you have a port.   °· The port can stay in for as long as your health care provider believes it is necessary.   °· A home health care nurse may give medicines and take care of the port.   °· You or a family member can get special training and directions for giving medicine and taking care of the port at home.   °SEEK MEDICAL CARE IF:  °· Your port does not flush or you are unable to get a blood return.   °· You have a fever or chills. °SEEK IMMEDIATE MEDICAL CARE IF: °· You have new fluid or pus coming from your incision.   °· You notice a bad smell coming from your incision site.   °· You have swelling, pain, or more redness at the incision or port site.   °· You have chest pain or shortness of breath. °Document Released: 10/21/2012 Document Revised: 01/05/2013 Document Reviewed: 10/21/2012 °[employer]® Patient Information ©2015 [employer]. This information is not intended to replace  advice given to you by your health care provider. Make sure you discuss any questions you have with your health care provider. °Implanted Port Home Guide °An implanted port is a type of central line that is placed under the skin. Central lines are used to provide IV access when treatment or nutrition needs to be given through a person's veins. Implanted ports are used for long-term IV access. An implanted port may be placed because:  °· You need IV medicine that would be irritating to the small veins in your hands or arms.   °· You need long-term IV medicines, such as antibiotics.   °· You need IV nutrition for a long period.   °· You need frequent blood draws for lab tests.   °· You need dialysis.   °Implanted ports are usually placed in the chest area, but they can also be placed in the upper arm, the abdomen, or the leg. An implanted port has two main parts:  °· Reservoir. The reservoir is round and will appear as a small, raised area under your skin. The reservoir is the part where a needle is inserted to give medicines or draw blood.   °· Catheter. The catheter is a thin, flexible tube that extends from the reservoir. The catheter is placed into a large vein. Medicine that is inserted into the reservoir goes into the catheter and then into the vein.   °HOW WILL I CARE FOR MY INCISION SITE? °Do not get the incision site wet. Bathe or   shower as directed by your health care provider.  °HOW IS MY PORT ACCESSED? °Special steps must be taken to access the port:  °· Before the port is accessed, a numbing cream can be placed on the skin. This helps numb the skin over the port site.   °· Your health care provider uses a sterile technique to access the port. °· Your health care provider must put on a mask and sterile gloves. °· The skin over your port is cleaned carefully with an antiseptic and allowed to dry. °· The port is gently pinched between sterile gloves, and a needle is inserted into the port. °· Only  "non-coring" port needles should be used to access the port. Once the port is accessed, a blood return should be checked. This helps ensure that the port is in the vein and is not clogged.   °· If your port needs to remain accessed for a constant infusion, a clear (transparent) bandage will be placed over the needle site. The bandage and needle will need to be changed every week, or as directed by your health care provider.   °· Keep the bandage covering the needle clean and dry. Do not get it wet. Follow your health care provider's instructions on how to take a shower or bath while the port is accessed.   °· If your port does not need to stay accessed, no bandage is needed over the port.   °WHAT IS FLUSHING? °Flushing helps keep the port from getting clogged. Follow your health care provider's instructions on how and when to flush the port. Ports are usually flushed with saline solution or a medicine called heparin. The need for flushing will depend on how the port is used.  °· If the port is used for intermittent medicines or blood draws, the port will need to be flushed:   °· After medicines have been given.   °· After blood has been drawn.   °· As part of routine maintenance.   °· If a constant infusion is running, the port may not need to be flushed.   °HOW LONG WILL MY PORT STAY IMPLANTED? °The port can stay in for as long as your health care provider thinks it is needed. When it is time for the port to come out, surgery will be done to remove it. The procedure is similar to the one performed when the port was put in.  °WHEN SHOULD I SEEK IMMEDIATE MEDICAL CARE? °When you have an implanted port, you should seek immediate medical care if:  °· You notice a bad smell coming from the incision site.   °· You have swelling, redness, or drainage at the incision site.   °· You have more swelling or pain at the port site or the surrounding area.   °· You have a fever that is not controlled with medicine. °Document  Released: 12/31/2004 Document Revised: 10/21/2012 Document Reviewed: 09/07/2012 °[employer]® Patient Information ©2015 [employer]. This information is not intended to replace advice given to you by your health care provider. Make sure you discuss any questions you have with your health care provider. °Conscious Sedation °Sedation is the use of medicines to promote relaxation and relieve discomfort and anxiety. Conscious sedation is a type of sedation. Under conscious sedation you are less alert than normal but are still able to respond to instructions or stimulation. Conscious sedation is used during short medical and dental procedures. It is milder than deep sedation or general anesthesia and allows you to return to your regular activities sooner.  °LET YOUR HEALTH CARE PROVIDER   KNOW ABOUT:  °· Any allergies you have. °· All medicines you are taking, including vitamins, herbs, eye drops, creams, and over-the-counter medicines. °· Use of steroids (by mouth or creams). °· Previous problems you or members of your family have had with the use of anesthetics. °· Any blood disorders you have. °· Previous surgeries you have had. °· Medical conditions you have. °· Possibility of pregnancy, if this applies. °· Use of cigarettes, alcohol, or illegal drugs. °RISKS AND COMPLICATIONS °Generally, this is a safe procedure. However, as with any procedure, problems can occur. Possible problems include: °· Oversedation. °· Trouble breathing on your own. You may need to have a breathing tube until you are awake and breathing on your own. °· Allergic reaction to any of the medicines used for the procedure. °BEFORE THE PROCEDURE °· You may have blood tests done. These tests can help show how well your kidneys and liver are working. They can also show how well your blood clots. °· A physical exam will be done.   °· Only take medicines as directed by your health care provider. You may need to stop taking medicines (such as blood  thinners, aspirin, or nonsteroidal anti-inflammatory drugs) before the procedure.   °· Do not eat or drink at least 6 hours before the procedure or as directed by your health care provider. °· Arrange for a responsible adult, family member, or friend to take you home after the procedure. He or she should stay with you for at least 24 hours after the procedure, until the medicine has worn off. °PROCEDURE  °· An intravenous (IV) catheter will be inserted into one of your veins. Medicine will be able to flow directly into your body through this catheter. You may be given medicine through this tube to help prevent pain and help you relax. °· The medical or dental procedure will be done. °AFTER THE PROCEDURE °· You will stay in a recovery area until the medicine has worn off. Your blood pressure and pulse will be checked.   °·  Depending on the procedure you had, you may be allowed to go home when you can tolerate liquids and your pain is under control. °Document Released: 09/25/2000 Document Revised: 01/05/2013 Document Reviewed: 09/07/2012 °[employer]® Patient Information ©2015 [employer]. This information is not intended to replace advice given to you by your health care provider. Make sure you discuss any questions you have with your health care provider. °Conscious Sedation, Adult, Care After °Refer to this sheet in the next few weeks. These instructions provide you with information on caring for yourself after your procedure. Your health care provider may also give you more specific instructions. Your treatment has been planned according to current medical practices, but problems sometimes occur. Call your health care provider if you have any problems or questions after your procedure. °WHAT TO EXPECT AFTER THE PROCEDURE  °After your procedure: °· You may feel sleepy, clumsy, and have poor balance for several hours. °· Vomiting may occur if you eat too soon after the procedure. °HOME CARE INSTRUCTIONS °· Do not  participate in any activities where you could become injured for at least 24 hours. Do not: °¨ Drive. °¨ Swim. °¨ Ride a bicycle. °¨ Operate heavy machinery. °¨ Cook. °¨ Use power tools. °¨ Climb ladders. °¨ Work from a high place. °· Do not make important decisions or sign legal documents until you are improved. °· If you vomit, drink water, juice, or soup when you can drink without vomiting. Make sure you have little   or no nausea before eating solid foods. °· Only take over-the-counter or prescription medicines for pain, discomfort, or fever as directed by your health care provider. °· Make sure you and your family fully understand everything about the medicines given to you, including what side effects may occur. °· You should not drink alcohol, take sleeping pills, or take medicines that cause drowsiness for at least 24 hours. °· If you smoke, do not smoke without supervision. °· If you are feeling better, you may resume normal activities 24 hours after you were sedated. °· Keep all appointments with your health care provider. °SEEK MEDICAL CARE IF: °· Your skin is pale or bluish in color. °· You continue to feel nauseous or vomit. °· Your pain is getting worse and is not helped by medicine. °· You have bleeding or swelling. °· You are still sleepy or feeling clumsy after 24 hours. °SEEK IMMEDIATE MEDICAL CARE IF: °· You develop a rash. °· You have difficulty breathing. °· You develop any type of allergic problem. °· You have a fever. °MAKE SURE YOU: °· Understand these instructions. °· Will watch your condition. °· Will get help right away if you are not doing well or get worse. °Document Released: 10/21/2012 Document Reviewed: 10/21/2012 °[employer]® Patient Information ©2015 [employer]. This information is not intended to replace advice given to you by your health care provider. Make sure you discuss any questions you have with your health care provider. ° °

## 2014-10-06 NOTE — Procedures (Signed)
Placement of right jugular port.  Tip at SVC/RA junction and ready to use.  No immediate complication.  Minimal blood loss. 

## 2014-10-06 NOTE — Progress Notes (Signed)
Patient ID: [name], male   DOB: 1945/12/07, 69 y.o.   MRN: [id number]    Referring Physician(s): Ennever,Peter R  Chief Complaint:  Myelodysplasia  Subjective:  Pt familiar to IR service from recent IVC filter placement on 09/18/14. He has hx of myelodysplasia, and pancytopenia. He is also recovering from recent left spontaneous pneumothorax requiring chest tube placement. The tube was removed on 09/20/14.He presents today for port a cath placement for chemotherapy. He denies recent fever, chills, HA, CP, worsening dyspnea, abd/back pain,N/V or abnormal bleeding.   Allergies: Cephalexin; Phenergan; Vicodin; and Percocet  Medications: Prior to Admission medications   Medication Sig Start Date End Date Taking? Authorizing Provider  acetaminophen (TYLENOL) 500 MG tablet Take 500 mg by mouth every 6 (six) hours as needed for fever.   Yes Historical Provider, MD  ALPRAZolam (XANAX) 0.25 MG tablet Take 1 tablet (0.25 mg total) by mouth 3 (three) times daily as needed for anxiety. 10/03/14  Yes Volanda Napoleon, MD  aspirin EC 81 MG tablet Take 1 tablet (81 mg total) by mouth daily. 03/30/14  Yes Sherren Mocha, MD  carboxymethylcellulose (REFRESH TEARS) 0.5 % SOLN Place 2 drops into both eyes 3 (three) times daily as needed (For dry eyes.).   Yes Historical Provider, MD  Darbepoetin Alfa (ARANESP) 500 MCG/ML SOSY injection Inject 500 mcg into the skin once a week.   Yes Historical Provider, MD  lidocaine-prilocaine (EMLA) cream Apply 1 application topically as needed. Patient taking differently: Apply 1 application topically as needed (For port-a-cath.).  09/13/14  Yes Volanda Napoleon, MD  metoprolol tartrate (LOPRESSOR) 25 MG tablet Take 0.5 tablets (12.5 mg total) by mouth 2 (two) times daily. 08/03/13  Yes Sherren Mocha, MD  nitroGLYCERIN (NITROSTAT) 0.4 MG SL tablet Place 1 tablet (0.4 mg total) under the tongue every 5 (five) minutes as needed for chest pain. 03/29/14  Yes Sherren Mocha, MD   oxyCODONE-acetaminophen (PERCOCET) 7.5-325 MG per tablet Take 1 tablet by mouth every 4 (four) hours as needed for severe pain. 09/20/14  Yes Charlynne Cousins, MD  pantoprazole (PROTONIX) 40 MG tablet Take 40 mg by mouth at bedtime.  08/06/13  Yes Nita Sells, MD  predniSONE (DELTASONE) 20 MG tablet Take 10-40 mg by mouth daily as needed (For chemo prophylaxis.). He is to take two tablets daily for 6 days, one tablet daily for 3 days and then half a tablet daily for 5 days.   Yes Historical Provider, MD  PRESCRIPTION MEDICATION His oncologist is Dr. Marin Olp and he receives his treatments at the [facility] in [facility] He received his last dose of Vidaza 200mg  on 09/14/14.   Yes Historical Provider, MD  prochlorperazine (COMPAZINE) 10 MG tablet Take 1 tablet (10 mg total) by mouth every 6 (six) hours as needed (Nausea or vomiting). 04/11/14  Yes Volanda Napoleon, MD  simvastatin (ZOCOR) 20 MG tablet Take 1 tablet (20 mg total) by mouth at bedtime. 03/30/14  Yes Sherren Mocha, MD  temazepam (RESTORIL) 15 MG capsule Take 15 mg by mouth at bedtime as needed for sleep.   Yes Historical Provider, MD  testosterone cypionate (DEPOTESTOTERONE CYPIONATE) 100 MG/ML injection Inject 400 mg into the muscle every 28 (twenty-eight) days. For IM use only   Yes Historical Provider, MD  LORazepam (ATIVAN) 0.5 MG tablet Take 1 tablet (0.5 mg total) by mouth every 6 (six) hours as needed (Nausea or vomiting). 04/11/14   Volanda Napoleon, MD  Vital Signs: BP 125/68 mmHg  Pulse 59  Temp(Src) 98.8 F (37.1 C) (Oral)  Resp 16  SpO2 100%  Physical Exam  Constitutional: He is oriented to person, place, and time. He appears well-developed and well-nourished.  Cardiovascular: Normal rate and regular rhythm.   Murmur heard. Pulmonary/Chest: Effort normal.  distant BS bilat  Abdominal: Soft. Bowel sounds are normal. There is no tenderness.  Musculoskeletal: Normal range of motion. He exhibits no  edema.  Neurological: He is alert and oriented to person, place, and time.    Imaging: Dg Chest 2 View  10/04/2014   CLINICAL DATA:  History of spontaneous left pneumothorax.  EXAM: CHEST  2 VIEW  COMPARISON:  09/20/2014 and previous radiographs.  FINDINGS: Cardiomediastinal silhouette is normal. Mediastinal contours appear intact.  There is no evidence of focal airspace consolidation, pleural effusion or pneumothorax. Subtle linear opacity overlying the left lung apex may represent subpleural scarring.  Osseous structures are without acute abnormality. Soft tissues are grossly normal.  IMPRESSION: No evidence of recurrent left pneumothorax. Subtle left apical subpleural scarring.   Electronically Signed   By: Fidela Salisbury M.D.   On: 10/04/2014 09:34    Labs:  CBC:  Recent Labs  09/27/14 1119 10/03/14 0855 10/06/14 1013 10/06/14 1235  WBC 2.9* 2.0* 1.5* 1.6*  HGB 7.4* 6.5* 5.9* 6.5*  HCT 22.4* 20.1* 17.6* 19.2*  PLT 23* 37* 59* 50*    COAGS:  Recent Labs  03/30/14 1242 09/18/14 0930 10/06/14 1013  INR 1.3* 1.27 1.26  APTT  --   --  39*    BMP:  Recent Labs  09/17/14 0337 09/18/14 0456 09/19/14 0400 09/20/14 1110 10/03/14 0855  NA 133* 131* 132* 135 135  K 4.0 3.7 3.9 3.8 3.6  CL 101 99* 101 104 102  CO2 26 25 23 23 25   GLUCOSE 121* 131* 130* 124* 115  BUN 16 15 18 20 16   CALCIUM 8.1* 8.2* 8.2* 8.4* 9.3  CREATININE 1.19 1.23 1.26* 1.30* 1.2  GFRNONAA >60 59* 57* 55*  --   GFRAA >60 >60 >60 >60  --     LIVER FUNCTION TESTS:  Recent Labs  04/06/14 1454  05/26/14 0845  06/21/14 1105 07/11/14 1405 09/12/14 1325 10/03/14 0855  BILITOT 0.5  < > 0.4  < > 0.6 1.20 0.71 0.90  AST 21  < > 20  < > 15 22 13 23   ALT 18  < > 26  < > 19 33 33 25  ALKPHOS 99  < > 81  < > 59 60 79 72  PROT 7.7  < > 7.5  < > 6.8 7.3 7.1 7.6  ALBUMIN 4.6  --  4.0  --  3.8  --  3.6  --   < > = values in this interval not displayed.  Assessment and Plan: Pt with hx  myelodysplasia, pancytopenia; plan is for port a cath placement today for chemotherapy.Risks and benefits discussed with the patient/wife including, but not limited to bleeding, infection, pneumothorax, or fibrin sheath development and need for additional procedures. All of the patient's questions were answered, patient is agreeable to proceed. Consent signed and in chart.Labs today reviewed with Dr. Marin Olp. All parties are in agreement to proceed with port placement.      Signed: D. Rowe Robert 10/06/2014, 1:41 PM   I spent a total of 15 minutes at the the patient's bedside AND on the patient's hospital floor or unit, greater than 50% of which was counseling/coordinating  care for port a cath placement

## 2014-10-06 NOTE — Progress Notes (Signed)
CRITICAL VALUE ALERT  Critical value received:  hgb  Date of notification:  09.22.2016  Time of notification:  3143  Critical value read back:Yes.    Nurse who received alert: karen love  MD notified (1st page):   Time of first page:  80  MD notified (2nd page):[phone]  Time of second page:  Responding  Lennette Bihari allred/radiology pa  Time MD responded: [phone]

## 2014-10-07 ENCOUNTER — Ambulatory Visit: Payer: Medicare Other

## 2014-10-07 ENCOUNTER — Other Ambulatory Visit: Payer: Self-pay

## 2014-10-07 ENCOUNTER — Telehealth: Payer: Self-pay | Admitting: Emergency Medicine

## 2014-10-07 DIAGNOSIS — D46Z Other myelodysplastic syndromes: Secondary | ICD-10-CM

## 2014-10-07 LAB — TYPE AND SCREEN
ABO/RH(D): A POS
Antibody Screen: NEGATIVE
Unit division: 0
Unit division: 0

## 2014-10-07 NOTE — Telephone Encounter (Signed)
Spoke with patient's spouse; States that patient has low energy level and continues to have decreased appetite. States he has been resting all day today. States he did experience some chest pain last evening, which was relieved after taking a nitroglycerin tablet. Encourage patient to take spouse to the emergency for any worsening complaints or chest pains. Spouse verbalized understanding.

## 2014-10-09 ENCOUNTER — Encounter (HOSPITAL_COMMUNITY): Payer: Self-pay

## 2014-10-09 ENCOUNTER — Other Ambulatory Visit: Payer: Self-pay

## 2014-10-09 ENCOUNTER — Inpatient Hospital Stay (HOSPITAL_COMMUNITY)
Admission: EM | Admit: 2014-10-09 | Discharge: 2014-10-17 | DRG: 164 | Disposition: A | Discharge status: Home / Self Care | Payer: Medicare Other | Attending: Cardiothoracic Surgery | Admitting: Cardiothoracic Surgery

## 2014-10-09 ENCOUNTER — Other Ambulatory Visit (HOSPITAL_COMMUNITY): Payer: Self-pay

## 2014-10-09 ENCOUNTER — Emergency Department (HOSPITAL_COMMUNITY): Payer: Medicare Other

## 2014-10-09 DIAGNOSIS — K567 Ileus, unspecified: Secondary | ICD-10-CM

## 2014-10-09 DIAGNOSIS — R0602 Shortness of breath: Secondary | ICD-10-CM | POA: Diagnosis present

## 2014-10-09 DIAGNOSIS — Z7982 Long term (current) use of aspirin: Secondary | ICD-10-CM | POA: Diagnosis not present

## 2014-10-09 DIAGNOSIS — Z955 Presence of coronary angioplasty implant and graft: Secondary | ICD-10-CM | POA: Diagnosis not present

## 2014-10-09 DIAGNOSIS — R112 Nausea with vomiting, unspecified: Secondary | ICD-10-CM | POA: Diagnosis present

## 2014-10-09 DIAGNOSIS — F329 Major depressive disorder, single episode, unspecified: Secondary | ICD-10-CM | POA: Diagnosis present

## 2014-10-09 DIAGNOSIS — I10 Essential (primary) hypertension: Secondary | ICD-10-CM | POA: Diagnosis present

## 2014-10-09 DIAGNOSIS — J939 Pneumothorax, unspecified: Secondary | ICD-10-CM

## 2014-10-09 DIAGNOSIS — Z9689 Presence of other specified functional implants: Secondary | ICD-10-CM

## 2014-10-09 DIAGNOSIS — I714 Abdominal aortic aneurysm, without rupture, unspecified: Secondary | ICD-10-CM

## 2014-10-09 DIAGNOSIS — Z79891 Long term (current) use of opiate analgesic: Secondary | ICD-10-CM | POA: Diagnosis not present

## 2014-10-09 DIAGNOSIS — Z809 Family history of malignant neoplasm, unspecified: Secondary | ICD-10-CM | POA: Diagnosis not present

## 2014-10-09 DIAGNOSIS — D649 Anemia, unspecified: Secondary | ICD-10-CM

## 2014-10-09 DIAGNOSIS — D4622 Refractory anemia with excess of blasts 2: Secondary | ICD-10-CM | POA: Diagnosis present

## 2014-10-09 DIAGNOSIS — D46Z Other myelodysplastic syndromes: Secondary | ICD-10-CM

## 2014-10-09 DIAGNOSIS — Z87891 Personal history of nicotine dependence: Secondary | ICD-10-CM | POA: Diagnosis not present

## 2014-10-09 DIAGNOSIS — E785 Hyperlipidemia, unspecified: Secondary | ICD-10-CM | POA: Diagnosis present

## 2014-10-09 DIAGNOSIS — D469 Myelodysplastic syndrome, unspecified: Secondary | ICD-10-CM | POA: Diagnosis not present

## 2014-10-09 DIAGNOSIS — D61818 Other pancytopenia: Secondary | ICD-10-CM

## 2014-10-09 DIAGNOSIS — Z881 Allergy status to other antibiotic agents status: Secondary | ICD-10-CM | POA: Diagnosis not present

## 2014-10-09 DIAGNOSIS — E7800 Pure hypercholesterolemia, unspecified: Secondary | ICD-10-CM | POA: Diagnosis present

## 2014-10-09 DIAGNOSIS — Z419 Encounter for procedure for purposes other than remedying health state, unspecified: Secondary | ICD-10-CM

## 2014-10-09 DIAGNOSIS — I252 Old myocardial infarction: Secondary | ICD-10-CM | POA: Diagnosis not present

## 2014-10-09 DIAGNOSIS — Z885 Allergy status to narcotic agent status: Secondary | ICD-10-CM | POA: Diagnosis not present

## 2014-10-09 DIAGNOSIS — K219 Gastro-esophageal reflux disease without esophagitis: Secondary | ICD-10-CM | POA: Diagnosis present

## 2014-10-09 DIAGNOSIS — R509 Fever, unspecified: Secondary | ICD-10-CM

## 2014-10-09 DIAGNOSIS — R251 Tremor, unspecified: Secondary | ICD-10-CM | POA: Diagnosis not present

## 2014-10-09 DIAGNOSIS — Z09 Encounter for follow-up examination after completed treatment for conditions other than malignant neoplasm: Secondary | ICD-10-CM

## 2014-10-09 DIAGNOSIS — D696 Thrombocytopenia, unspecified: Secondary | ICD-10-CM

## 2014-10-09 DIAGNOSIS — J9383 Other pneumothorax: Secondary | ICD-10-CM | POA: Diagnosis present

## 2014-10-09 DIAGNOSIS — T450X5A Adverse effect of antiallergic and antiemetic drugs, initial encounter: Secondary | ICD-10-CM | POA: Diagnosis not present

## 2014-10-09 DIAGNOSIS — Z79899 Other long term (current) drug therapy: Secondary | ICD-10-CM | POA: Diagnosis not present

## 2014-10-09 DIAGNOSIS — J449 Chronic obstructive pulmonary disease, unspecified: Secondary | ICD-10-CM | POA: Diagnosis present

## 2014-10-09 DIAGNOSIS — Z95828 Presence of other vascular implants and grafts: Secondary | ICD-10-CM | POA: Diagnosis not present

## 2014-10-09 DIAGNOSIS — J9811 Atelectasis: Secondary | ICD-10-CM

## 2014-10-09 DIAGNOSIS — I2510 Atherosclerotic heart disease of native coronary artery without angina pectoris: Secondary | ICD-10-CM | POA: Diagnosis present

## 2014-10-09 DIAGNOSIS — Z8249 Family history of ischemic heart disease and other diseases of the circulatory system: Secondary | ICD-10-CM | POA: Diagnosis not present

## 2014-10-09 DIAGNOSIS — R1013 Epigastric pain: Secondary | ICD-10-CM

## 2014-10-09 DIAGNOSIS — Z888 Allergy status to other drugs, medicaments and biological substances status: Secondary | ICD-10-CM | POA: Diagnosis not present

## 2014-10-09 DIAGNOSIS — J9311 Primary spontaneous pneumothorax: Secondary | ICD-10-CM | POA: Diagnosis not present

## 2014-10-09 DIAGNOSIS — Z981 Arthrodesis status: Secondary | ICD-10-CM | POA: Diagnosis not present

## 2014-10-09 DIAGNOSIS — Z86718 Personal history of other venous thrombosis and embolism: Secondary | ICD-10-CM | POA: Diagnosis not present

## 2014-10-09 DIAGNOSIS — F17200 Nicotine dependence, unspecified, uncomplicated: Secondary | ICD-10-CM

## 2014-10-09 DIAGNOSIS — I739 Peripheral vascular disease, unspecified: Secondary | ICD-10-CM | POA: Diagnosis present

## 2014-10-09 HISTORY — DX: Pneumothorax, unspecified: J93.9

## 2014-10-09 LAB — COMPREHENSIVE METABOLIC PANEL
ALK PHOS: 67 U/L (ref 38–126)
ALT: 18 U/L (ref 17–63)
AST: 20 U/L (ref 15–41)
Albumin: 3.5 g/dL (ref 3.5–5.0)
Anion gap: 12 (ref 5–15)
BILIRUBIN TOTAL: 0.8 mg/dL (ref 0.3–1.2)
BUN: 17 mg/dL (ref 6–20)
CO2: 23 mmol/L (ref 22–32)
CREATININE: 1.35 mg/dL — AB (ref 0.61–1.24)
Calcium: 8.8 mg/dL — ABNORMAL LOW (ref 8.9–10.3)
Chloride: 102 mmol/L (ref 101–111)
GFR calc Af Amer: >60 mL/min (ref >60)
GFR, EST NON AFRICAN AMERICAN: 53 mL/min — AB (ref >60)
Glucose, Bld: 135 mg/dL — ABNORMAL HIGH (ref 65–99)
Potassium: 4.3 mmol/L (ref 3.5–5.1)
Sodium: 137 mmol/L (ref 135–145)
TOTAL PROTEIN: 7.2 g/dL (ref 6.5–8.1)

## 2014-10-09 LAB — CBC WITH DIFFERENTIAL/PLATELET
BASOS ABS: 0 10*3/uL (ref 0.0–0.1)
Basophils Relative: 0 %
Eosinophils Absolute: 0 10*3/uL (ref 0.0–0.7)
Eosinophils Relative: 0 %
HEMATOCRIT: 20.5 % — AB (ref 39.0–52.0)
Hemoglobin: 7 g/dL — ABNORMAL LOW (ref 13.0–17.0)
LYMPHS PCT: 31 %
Lymphs Abs: 0.5 10*3/uL — ABNORMAL LOW (ref 0.7–4.0)
MCH: 29.3 pg (ref 26.0–34.0)
MCHC: 34.1 g/dL (ref 30.0–36.0)
MCV: 85.8 fL (ref 78.0–100.0)
MONOS PCT: 8 %
Monocytes Absolute: 0.1 10*3/uL (ref 0.1–1.0)
NEUTROS ABS: 0.9 10*3/uL — AB (ref 1.7–7.7)
Neutrophils Relative %: 61 %
Platelets: 31 10*3/uL — ABNORMAL LOW (ref 150–400)
RBC: 2.39 MIL/uL — AB (ref 4.22–5.81)
RDW: 14.1 % (ref 11.5–15.5)
WBC: 1.5 10*3/uL — ABNORMAL LOW (ref 4.0–10.5)

## 2014-10-09 LAB — I-STAT TROPONIN, ED: Troponin i, poc: 0 ng/mL (ref 0.00–0.08)

## 2014-10-09 LAB — I-STAT CG4 LACTIC ACID, ED: Lactic Acid, Venous: 0.91 mmol/L (ref 0.5–2.0)

## 2014-10-09 LAB — LIPASE, BLOOD: LIPASE: 26 U/L (ref 22–51)

## 2014-10-09 MED ORDER — DOCUSATE SODIUM 100 MG PO CAPS
100.0000 mg | ORAL_CAPSULE | Freq: Two times a day (BID) | ORAL | Status: DC
  Administered 2014-10-10 (×3): 100 mg via ORAL
  Filled 2014-10-09 (×3): qty 1

## 2014-10-09 MED ORDER — SODIUM CHLORIDE 0.9 % IJ SOLN
10.0000 mL | INTRAMUSCULAR | Status: DC | PRN
  Administered 2014-10-09: 20 mL
  Filled 2014-10-09: qty 40

## 2014-10-09 MED ORDER — ALBUTEROL SULFATE (2.5 MG/3ML) 0.083% IN NEBU
5.0000 mg | INHALATION_SOLUTION | Freq: Once | RESPIRATORY_TRACT | Status: AC
  Administered 2014-10-09: 5 mg via RESPIRATORY_TRACT
  Filled 2014-10-09: qty 6

## 2014-10-09 MED ORDER — KCL IN DEXTROSE-NACL 10-5-0.45 MEQ/L-%-% IV SOLN
INTRAVENOUS | Status: DC
  Administered 2014-10-09: via INTRAVENOUS
  Filled 2014-10-09 (×2): qty 1000

## 2014-10-09 MED ORDER — NITROGLYCERIN 0.4 MG SL SUBL: 0.4000 mg | SUBLINGUAL_TABLET | SUBLINGUAL | Status: DC | PRN

## 2014-10-09 MED ORDER — ONDANSETRON HCL 4 MG/2ML IJ SOLN
4.0000 mg | Freq: Once | INTRAMUSCULAR | Status: AC
  Administered 2014-10-09: 4 mg via INTRAVENOUS
  Filled 2014-10-09: qty 2

## 2014-10-09 MED ORDER — LORAZEPAM BOLUS VIA INFUSION: 0.5000 mg | INTRAVENOUS | Status: DC | PRN

## 2014-10-09 MED ORDER — LIDOCAINE HCL (PF) 1 % IJ SOLN
5.0000 mL | Freq: Once | INTRAMUSCULAR | Status: AC
  Administered 2014-10-09: 5 mL via INTRADERMAL
  Filled 2014-10-09: qty 5

## 2014-10-09 MED ORDER — HYDROMORPHONE HCL 1 MG/ML IJ SOLN
0.5000 mg | Freq: Once | INTRAMUSCULAR | Status: AC
  Administered 2014-10-09: 0.5 mg via INTRAVENOUS
  Filled 2014-10-09: qty 1

## 2014-10-09 MED ORDER — PANTOPRAZOLE SODIUM 40 MG PO TBEC
40.0000 mg | DELAYED_RELEASE_TABLET | Freq: Every day | ORAL | Status: DC
  Administered 2014-10-10: 40 mg via ORAL
  Filled 2014-10-09: qty 1

## 2014-10-09 MED ORDER — MIDAZOLAM HCL 2 MG/2ML IJ SOLN
INTRAMUSCULAR | Status: AC | PRN
  Administered 2014-10-09: 1 mg via INTRAVENOUS

## 2014-10-09 MED ORDER — KETOTIFEN FUMARATE 0.025 % OP SOLN
2.0000 [drp] | Freq: Two times a day (BID) | OPHTHALMIC | Status: DC
  Administered 2014-10-10 – 2014-10-17 (×14): 2 [drp] via OPHTHALMIC
  Filled 2014-10-09 (×2): qty 5

## 2014-10-09 MED ORDER — ONDANSETRON HCL 4 MG/2ML IJ SOLN
4.0000 mg | Freq: Four times a day (QID) | INTRAMUSCULAR | Status: DC | PRN
  Administered 2014-10-10 – 2014-10-11 (×3): 4 mg via INTRAVENOUS
  Filled 2014-10-09 (×2): qty 2

## 2014-10-09 MED ORDER — ONDANSETRON 4 MG PO TBDP
4.0000 mg | ORAL_TABLET | Freq: Once | ORAL | Status: AC
  Administered 2014-10-09: 4 mg via ORAL
  Filled 2014-10-09: qty 1

## 2014-10-09 MED ORDER — SIMVASTATIN 20 MG PO TABS
20.0000 mg | ORAL_TABLET | Freq: Every day | ORAL | Status: DC
  Administered 2014-10-10 – 2014-10-16 (×7): 20 mg via ORAL
  Filled 2014-10-09 (×9): qty 1

## 2014-10-09 MED ORDER — LIDOCAINE-PRILOCAINE 2.5-2.5 % EX CREA
TOPICAL_CREAM | Freq: Once | CUTANEOUS | Status: AC
  Administered 2014-10-09: 1 via TOPICAL
  Filled 2014-10-09: qty 5

## 2014-10-09 MED ORDER — THIAMINE HCL 100 MG/ML IJ SOLN
100.0000 mg | Freq: Every day | INTRAMUSCULAR | Status: DC
  Administered 2014-10-10: 100 mg via INTRAVENOUS
  Filled 2014-10-09: qty 1

## 2014-10-09 MED ORDER — METOPROLOL TARTRATE 12.5 MG HALF TABLET
12.5000 mg | ORAL_TABLET | Freq: Two times a day (BID) | ORAL | Status: DC
  Administered 2014-10-10 – 2014-10-17 (×12): 12.5 mg via ORAL
  Filled 2014-10-09 (×17): qty 1

## 2014-10-09 MED ORDER — LORAZEPAM 2 MG/ML IJ SOLN: 0.5000 mg | INTRAMUSCULAR | Status: DC | PRN

## 2014-10-09 MED ORDER — MIDAZOLAM HCL 2 MG/2ML IJ SOLN
INTRAMUSCULAR | Status: AC
  Filled 2014-10-09: qty 4

## 2014-10-09 MED ORDER — ALUM & MAG HYDROXIDE-SIMETH 200-200-20 MG/5ML PO SUSP
30.0000 mL | Freq: Four times a day (QID) | ORAL | Status: DC | PRN
  Administered 2014-10-13 – 2014-10-16 (×3): 30 mL via ORAL
  Filled 2014-10-09 (×4): qty 30

## 2014-10-09 MED ORDER — PROCHLORPERAZINE MALEATE 10 MG PO TABS
10.0000 mg | ORAL_TABLET | Freq: Four times a day (QID) | ORAL | Status: DC | PRN
  Filled 2014-10-09: qty 1

## 2014-10-09 MED ORDER — MIDAZOLAM HCL 5 MG/5ML IJ SOLN
4.0000 mg | Freq: Once | INTRAMUSCULAR | Status: AC
  Administered 2014-10-09: 2 mg via INTRAVENOUS

## 2014-10-09 MED ORDER — LEVOFLOXACIN IN D5W 500 MG/100ML IV SOLN
500.0000 mg | Freq: Every day | INTRAVENOUS | Status: DC
  Administered 2014-10-09: 500 mg via INTRAVENOUS
  Filled 2014-10-09 (×2): qty 100

## 2014-10-09 MED ORDER — FENTANYL CITRATE (PF) 100 MCG/2ML IJ SOLN
50.0000 ug | INTRAMUSCULAR | Status: DC | PRN
  Administered 2014-10-09 – 2014-10-11 (×7): 50 ug via INTRAVENOUS
  Administered 2014-10-11 (×2): 100 ug via INTRAVENOUS
  Administered 2014-10-11 (×2): 50 ug via INTRAVENOUS
  Administered 2014-10-11: 100 ug via INTRAVENOUS
  Administered 2014-10-11: 50 ug via INTRAVENOUS
  Filled 2014-10-09 (×7): qty 2

## 2014-10-09 MED ORDER — FENTANYL CITRATE (PF) 100 MCG/2ML IJ SOLN
INTRAMUSCULAR | Status: AC | PRN
  Administered 2014-10-09 (×2): 25 ug via INTRAVENOUS

## 2014-10-09 MED ORDER — ONDANSETRON HCL 4 MG PO TABS: 4.0000 mg | ORAL_TABLET | Freq: Four times a day (QID) | ORAL | Status: DC | PRN

## 2014-10-09 MED ORDER — PREDNISONE 20 MG PO TABS
20.0000 mg | ORAL_TABLET | Freq: Two times a day (BID) | ORAL | Status: DC
  Administered 2014-10-10 (×2): 20 mg via ORAL
  Filled 2014-10-09 (×5): qty 1

## 2014-10-09 MED ORDER — SORBITOL 70 % SOLN
30.0000 mL | Freq: Every day | Status: DC | PRN
  Filled 2014-10-09: qty 30

## 2014-10-09 MED ORDER — PREDNISONE 20 MG PO TABS
60.0000 mg | ORAL_TABLET | Freq: Once | ORAL | Status: AC
  Administered 2014-10-09: 60 mg via ORAL
  Filled 2014-10-09: qty 3

## 2014-10-09 MED ORDER — FENTANYL CITRATE (PF) 100 MCG/2ML IJ SOLN
50.0000 ug | Freq: Once | INTRAMUSCULAR | Status: AC
  Administered 2014-10-09: 25 ug via INTRAVENOUS
  Filled 2014-10-09: qty 2

## 2014-10-09 MED ORDER — IPRATROPIUM BROMIDE 0.02 % IN SOLN
0.5000 mg | Freq: Once | RESPIRATORY_TRACT | Status: AC
  Administered 2014-10-09: 0.5 mg via RESPIRATORY_TRACT
  Filled 2014-10-09: qty 2.5

## 2014-10-09 MED ORDER — ALPRAZOLAM 0.25 MG PO TABS
0.2500 mg | ORAL_TABLET | Freq: Three times a day (TID) | ORAL | Status: DC | PRN
  Administered 2014-10-10 – 2014-10-16 (×5): 0.25 mg via ORAL
  Filled 2014-10-09 (×5): qty 1

## 2014-10-09 NOTE — Op Note (Signed)
NAMESWAYZE, PRIES NO.:  [id number]  MEDICAL RECORD NO.:  [id number]  LOCATION:  [facility]                         FACILITY:  [facility]  PHYSICIAN:  Ivin Poot, M.D.  DATE OF BIRTH:  [birth year]  DATE OF PROCEDURE:  10/09/2014 DATE OF DISCHARGE:                              OPERATIVE REPORT   OPERATION:  Placement of left chest tube.  PREOPERATIVE DIAGNOSIS:  Spontaneous left 80% pneumothorax.  POSTOPERATIVE DIAGNOSIS:  Spontaneous left 80% pneumothorax.  SURGEON:  Ivin Poot, M.D.  ANESTHESIA:  Local 1% lidocaine with IV conscious sedation, monitored.  DESCRIPTION OF PROCEDURE:  The patient presented to the ED with shortness of breath and chest pain, and was found to have a large left spontaneous pneumothorax.  He had been hospitalized earlier this month and had a left chest tube for the same problem.  He was to follow up in the office with Dr. Roxan Hockey 4 days ago and his chest x-ray at that time was fine and he was asymptomatic.  He denies any lifting or coughing episodes.  He did have a Port-A-Cath placed on the right side by Interventional Radiology earlier this week.  I recommended left chest tube placement and described the procedure in detail to the patient and wife and obtained informed consent.  They understood the risks of bleeding because of his hematologic malignancy would be increased.  OPERATIVE PROCEDURE:  The patient was placed supine on the stretcher in the emergency department room, and the left chest was prepped and draped as a sterile field.  A proper time-out was performed and the patient had previously been marked on the proper side.  Lidocaine 1% was infiltrated in the fifth interspace in the anterior axillary line.  The patient was given small doses of IV Versed and fentanyl by the nursing ED staff.  A small incision was made and further lidocaine was infiltrated down to the intercostal muscle.  The left pleural space was  entered with a hemostat and there was immediate out rushing of air spontaneously. Through this small incision, a 28-French chest tube was placed and directed to the apex and secured to the skin with 2 silk sutures.  It was connected to underwater-seal Pleur-Evac drainage system and the connections were sealed with tape.  Sterile dressing was applied.  A chest x-ray taken showed the chest tube to be in good position with re- expansion of the left lung.  There were no known complications.     Ivin Poot, M.D.     PV/MEDQ  D:  10/09/2014  T:  10/09/2014  Job:  [id number]

## 2014-10-09 NOTE — ED Provider Notes (Signed)
CSN: [id number]     Arrival date & time 10/09/14  1817 History   First MD Initiated Contact with Patient 10/09/14 1823     Chief Complaint  Patient presents with  . Shortness of Breath  . Nausea     (Consider location/radiation/quality/duration/timing/severity/associated sxs/prior Treatment) HPI Comments: [name] is a 69 y.o. male with a PMHx of CAD and MI s/p stent, HTN, HLD, AAA, diverticulitis, anemia, myelodysplastic syndrome, and recent spontaneous pneumothorax on 8/31 requiring chest tube insertion, who presents to the ED with complaints of gradual onset shortness breath 2 days. He had a port placement on Thursday, and Friday he began having some mild shortness of breath that has gradually worsened, accompanied by a cough with creamy brownish sputum production, chest congestion, wheezing, and nausea and vomiting with 4 episodes of nonbloody nonbilious emesis today. He also has some mild 6/10 intermittent throbbing periumbilical pain with no known aggravating factors, nonradiating, improving with vomiting. He states last night he had a temperature of 100.4, which he states he typically has at night due to his MDS. He is also had some orthopnea, requiring him to sleep in his recliner instead of his bed.  He denies any hemoptysis, chest pain, leg swelling, diarrhea, constipation, melena, hematochezia, hematemesis, dysuria, hematuria, numbness, tingling, weakness, lightheadedness, or diaphoresis. Occasionally when he coughs he has some chest pain, but it resolves immediately after he coughs. He has a history of a DVT in his leg, status post IVC placement. He has not smoked in the last 25 days. PCP is Dr. Kenton Kingfisher at [facility], and his oncologist is Dr. Marin Olp.   Patient is a 69 y.o. male presenting with shortness of breath. The history is provided by the patient and medical records. No language interpreter was used.  Shortness of Breath Severity:  Moderate Onset quality:  Gradual Duration:  2  days Timing:  Constant Progression:  Unchanged Chronicity:  Recurrent Relieved by:  Nothing Worsened by:  Nothing tried Ineffective treatments: mucinex. Associated symptoms: abdominal pain, cough, fever (100.4 at night), sputum production, vomiting and wheezing   Associated symptoms: no chest pain, no claudication, no diaphoresis and no hemoptysis   Associated symptoms comment:  +orthopnea Risk factors: hx of cancer, hx of PE/DVT, recent surgery and tobacco use     Past Medical History  Diagnosis Date  . Coronary atherosclerosis of native coronary artery     a. 10/2008 inf STEMI/PCI: LM 50d (IVUS-borderline lesion->med rx), LAD min irregs, LCX 90m, 70d, OM nl, RCA 149m (3.5x28 Vision BMS);  b. 11/2008 Lexiscan MV: EF 65%, no isch/scar.  . Essential hypertension, benign   . Pure hypercholesterolemia   . AAA (abdominal aortic aneurysm)     a. 05/2013 CT: 5.8 cm AAA.  Marland Kitchen Diverticulitis     a. 05/2013 CT: descending/sigmoid jxn w/o abscess.  . Osteoarthritis   . Tobacco abuse     a. ongoing - 1ppd for better part of 50 yrs.  . Normocytic anemia   . Cellulitis 06/10/2013    Right antecubital fossa at site of IV  03/12/13  . PONV (postoperative nausea and vomiting)   . Diverticulitis   . GERD (gastroesophageal reflux disease)   . MDS (myelodysplastic syndrome), high grade 04/13/2014  . Myocardial infarction 2010  . Depression   . Spontaneous pneumothorax 09/14/2014    LEFT LUNG  . Hypotestosteronemia 10/04/2014   Past Surgical History  Procedure Laterality Date  . Cardiac stents  2010  . Cholecystectomy N/A 06/19/2013    Procedure:  LAPAROSCOPIC CHOLECYSTECTOMY;  Surgeon: Harl Bowie, MD;  Location: [facility];  Service: General;  Laterality: N/A;  . Abdominal aortic endovascular stent graft N/A 07/02/2013    Procedure: ABDOMINAL AORTIC ENDOVASCULAR STENT GRAFT;  Surgeon: Serafina Mitchell, MD;  Location: [facility] OR;  Service: Vascular;  Laterality: N/A;  . Esophagogastroduodenoscopy N/A  08/05/2013    Procedure: ESOPHAGOGASTRODUODENOSCOPY (EGD);  Surgeon: Lear Ng, MD;  Location: [facility] ENDOSCOPY;  Service: Endoscopy;  Laterality: N/A;  . Eye surgery Left     cataract surgery  . Back surgery      cervical fusion  . Colonoscopy with propofol N/A 10/15/2013    Procedure: COLONOSCOPY WITH PROPOFOL;  Surgeon: Lear Ng, MD;  Location: [place];  Service: Endoscopy;  Laterality: N/A;  . Fetal blood transfusion  March 16,17,18, 2016  . Bone marrow biopsy  April 01, 2014  . Chest tube insertion  09/14/2014   Family History  Problem Relation Age of Onset  . Heart attack Brother     38s  . Cancer Father     Lung  . Cancer Mother     Brain  . Cancer Brother     Kidney   Social History  Substance Use Topics  . Smoking status: Former Smoker -- 1.00 packs/day for 60 years    Types: Cigarettes    Start date: 04/05/1964  . Smokeless tobacco: Never Used     Comment: 09/14/2014  . Alcohol Use: No    Review of Systems  Constitutional: Positive for fever (100.4 at night). Negative for chills and diaphoresis.  Respiratory: Positive for cough, sputum production, shortness of breath and wheezing. Negative for hemoptysis.   Cardiovascular: Negative for chest pain, claudication and leg swelling.  Gastrointestinal: Positive for nausea, vomiting and abdominal pain. Negative for diarrhea and constipation.  Genitourinary: Negative for dysuria and hematuria.  Musculoskeletal: Negative for myalgias and arthralgias.  Skin: Negative for color change.  Allergic/Immunologic: Positive for immunocompromised state (MDS).  Neurological: Negative for weakness, light-headedness and numbness.  Psychiatric/Behavioral: Negative for confusion.   10 Systems reviewed and are negative for acute change except as noted in the HPI.    Allergies  Cephalexin; Phenergan; Vicodin; and Percocet  Home Medications   Prior to Admission medications   Medication Sig Start Date End Date  Taking? Authorizing Lucyann Romano  acetaminophen (TYLENOL) 500 MG tablet Take 500 mg by mouth every 6 (six) hours as needed for fever.    Historical Nalin Mazzocco, MD  ALPRAZolam Duanne Moron) 0.25 MG tablet Take 1 tablet (0.25 mg total) by mouth 3 (three) times daily as needed for anxiety. 10/03/14   Volanda Napoleon, MD  aspirin EC 81 MG tablet Take 1 tablet (81 mg total) by mouth daily. 03/30/14   Sherren Mocha, MD  carboxymethylcellulose (REFRESH TEARS) 0.5 % SOLN Place 2 drops into both eyes 3 (three) times daily as needed (For dry eyes.).    Historical Tannisha Kennington, MD  Darbepoetin Alfa (ARANESP) 500 MCG/ML SOSY injection Inject 500 mcg into the skin once a week.    Historical Lennex Pietila, MD  lidocaine-prilocaine (EMLA) cream Apply 1 application topically as needed. Patient taking differently: Apply 1 application topically as needed (For port-a-cath.).  09/13/14   Volanda Napoleon, MD  LORazepam (ATIVAN) 0.5 MG tablet Take 1 tablet (0.5 mg total) by mouth every 6 (six) hours as needed (Nausea or vomiting). 04/11/14   Volanda Napoleon, MD  metoprolol tartrate (LOPRESSOR) 25 MG tablet Take 0.5 tablets (12.5 mg total) by mouth 2 (  two) times daily. 08/03/13   Sherren Mocha, MD  nitroGLYCERIN (NITROSTAT) 0.4 MG SL tablet Place 1 tablet (0.4 mg total) under the tongue every 5 (five) minutes as needed for chest pain. 03/29/14   Sherren Mocha, MD  oxyCODONE-acetaminophen (PERCOCET) 7.5-325 MG per tablet Take 1 tablet by mouth every 4 (four) hours as needed for severe pain. 09/20/14   Charlynne Cousins, MD  pantoprazole (PROTONIX) 40 MG tablet Take 40 mg by mouth at bedtime.  08/06/13   Nita Sells, MD  predniSONE (DELTASONE) 20 MG tablet Take 10-40 mg by mouth daily as needed (For chemo prophylaxis.). He is to take two tablets daily for 6 days, one tablet daily for 3 days and then half a tablet daily for 5 days.    Historical Hale Chalfin, MD  PRESCRIPTION MEDICATION His oncologist is Dr. Marin Olp and he receives his treatments  at the [facility] in [facility]. He received his last dose of Vidaza 274m on 09/14/14.    Historical Geselle Cardosa, MD  prochlorperazine (COMPAZINE) 10 MG tablet Take 1 tablet (10 mg total) by mouth every 6 (six) hours as needed (Nausea or vomiting). 04/11/14   PVolanda Napoleon MD  simvastatin (ZOCOR) 20 MG tablet Take 1 tablet (20 mg total) by mouth at bedtime. 03/30/14   MSherren Mocha MD  temazepam (RESTORIL) 15 MG capsule Take 15 mg by mouth at bedtime as needed for sleep.    Historical Lometa Riggin, MD  testosterone cypionate (DEPOTESTOTERONE CYPIONATE) 100 MG/ML injection Inject 400 mg into the muscle every 28 (twenty-eight) days. For IM use only    Historical Fanchon Papania, MD   BP 148/89 mmHg  Pulse 84  Temp(Src) 98.4 F (36.9 C) (Oral)  SpO2 100% Physical Exam  Constitutional: He is oriented to person, place, and time. Vital signs are normal. He appears well-developed and well-nourished.  Non-toxic appearance. No distress.  Afebrile, nontoxic, NAD  HENT:  Head: Normocephalic and atraumatic.  Mouth/Throat: Oropharynx is clear and moist and mucous membranes are normal.  Eyes: Conjunctivae and EOM are normal. Right eye exhibits no discharge. Left eye exhibits no discharge.  Neck: Normal range of motion. Neck supple.  Cardiovascular: Normal rate, regular rhythm, normal heart sounds and intact distal pulses.  Exam reveals no gallop and no friction rub.   No murmur heard. Pulmonary/Chest: Effort normal. No respiratory distress. He has no decreased breath sounds. He has wheezes. He has no rhonchi. He has no rales.  Diffuse wheezing throughout all lung fields, no rhonchi or rales, no hypoxia or increased WOB but pt on 2L via [place] during exam, speaking in full sentences, SpO2 97-100% on 2L via [employer] Port placement on R chest wall without erythema   Abdominal: Soft. Normal appearance and bowel sounds are normal. He exhibits no distension. There is tenderness in the periumbilical area. There is no  rigidity, no rebound, no guarding, no CVA tenderness, no tenderness at McBurney's point and negative Murphy's sign.  Soft, nondistended, +BS throughout, with mild periumbilical TTP, no r/g/r, neg murphy's, neg mcburney's, no CVA TTP   Musculoskeletal: Normal range of motion.  Neurological: He is alert and oriented to person, place, and time. He has normal strength. No sensory deficit.  Skin: Skin is warm, dry and intact. No rash noted.  Psychiatric: He has a normal mood and affect.  Nursing note and vitals reviewed.   ED Course  Procedures (including critical care time) Labs Review Labs Reviewed  CBC WITH DIFFERENTIAL/PLATELET - Abnormal; Notable for the following:  WBC 1.5 (*)    RBC 2.39 (*)    Hemoglobin 7.0 (*)    HCT 20.5 (*)    Platelets 31 (*)    Neutro Abs 0.9 (*)    Lymphs Abs 0.5 (*)    All other components within normal limits  COMPREHENSIVE METABOLIC PANEL - Abnormal; Notable for the following:    Glucose, Bld 135 (*)    Creatinine, Ser 1.35 (*)    Calcium 8.8 (*)    GFR calc non Af Amer 53 (*)    All other components within normal limits  URINE CULTURE  LIPASE, BLOOD  URINALYSIS, ROUTINE W REFLEX MICROSCOPIC (NOT AT [facility])  Randolm Idol, ED  I-STAT CG4 LACTIC ACID, ED    Imaging Review Dg Chest Port 1 View  10/09/2014   CLINICAL DATA:  Shortness of breath. History of right Port-A-Cath placement on Thursday.  EXAM: PORTABLE CHEST 1 VIEW  COMPARISON:  PA and lateral chest 10/04/2014.  FINDINGS: The patient has a very large left pneumothorax. Right IJ approach Port-A-Cath is in place with the tip projecting at the superior cavoatrial junction. The right lung is expanded and clear. Heart size is normal. There is no mediastinal shift. No focal bony abnormality.  IMPRESSION: Very large left pneumothorax. Critical Value/emergent results were called by telephone at the time of interpretation on 10/09/2014 at 7:16 pm to MERCEDES CAMPRUBI-SOMS. P.A., who verbally  acknowledged these results.   Electronically Signed   By: Inge Rise M.D.   On: 10/09/2014 19:17     Echo 03/2014 Study Conclusions - Left ventricle: The cavity size was normal. There was mild focal basal hypertrophy of the septum. Systolic function was normal. The estimated ejection fraction was in the range of 55% to 60%. Wall motion was normal; there were no regional wall motion abnormalities. Left ventricular diastolic function parameters were normal. - Aortic valve: There was trivial regurgitation. - Mitral valve: There was mild regurgitation. - Left atrium: The atrium was moderately to severely dilated. - Right ventricle: The cavity size was mildly dilated. - Right atrium: The atrium was moderately dilated. - Pulmonary arteries: Systolic pressure was mildly increased. PA peak pressure: 34 mm Hg (S). Impressions: - Normal LV function; biatrial enlargement; mild RVE; mild MR; trace AI.  Study Result: Nuc Med Perfusion scan 06/10/13    CLINICAL DATA: Chest pain  EXAM: MYOCARDIAL IMAGING WITH SPECT (REST AND PHARMACOLOGIC-STRESS)  GATED LEFT VENTRICULAR WALL MOTION STUDY  LEFT VENTRICULAR EJECTION FRACTION  TECHNIQUE: Standard myocardial SPECT imaging was performed after resting intravenous injection of 10 mCi [id number]. Subsequently, intravenous infusion of Lexiscan was performed under the supervision of the Cardiology staff. At peak effect of the drug, 30 mCi [id number] was injected intravenously and standard myocardial SPECT imaging was performed. Quantitative gated imaging was also performed to evaluate left ventricular wall motion, and estimate left ventricular ejection fraction.  COMPARISON: None.  FINDINGS: SPECT: Inferior wall attenuation artifact. No perfusion defects. No stress-induced ischemia.  Wall motion: Normal motion.  Ejection fraction: 63%. End-diastolic volume 12366c. End systolic volume 47  cc.  IMPRESSION: Applying for a inferior wall attenuation artifact, there are no perfusion defects and there is no evidence of stress-induced ischemia.   Electronically Signed  By: ArMaryclare Bean.D.  On: 06/10/2013 16:35     I have personally reviewed and evaluated these images and lab results as part of my medical decision-making.   EKG Interpretation None      MDM   Final diagnoses:  SOB (shortness of breath)  Pneumothorax  Thrombocytopenia  Normocytic anemia  Recurrent fever of unknown cause  Non-intractable vomiting with nausea, vomiting of unspecified type  Epigastric abdominal pain    69 y.o. male here with gradual onset shortness of breath 2 days. He reports chest congestion and cough with a creamy brownish sputum production, as well as nausea and vomiting 4 and wheezing. He a fever 100.4 last night, but he states that he has nightly fevers around the same temperature due to his MDS. He had a port placed on Thursday. History of DVT in his leg, status post IVC placement. He is not usually on oxygen, but here he is requiring 2 L of nasal cannula oxygen to maintain saturations at 100%. On exam, some mild wheezing diffusely throughout, no rhonchi or rales. Mild abdominal tenderness in the periumbilical area, patient with a history of abdominal aortic aneurysm status post bypass. Given his complicating history, will obtain portable chest x-ray to r/o for pneumothorax, as well as CT chest and abdomen pelvis to rule out PE and dissection or aneurysm. Will obtain labs and EKG. Will give him nausea medicine, pain medicine, nebulizer, and prednisone. Will reassess shortly.  7:29 PM Radiologist calling and stated that there is a large pneumothorax. Will consult CTS. On re-eval, L lung sounds diminished. Pt stated that right after my initial eval, he vomited very intensely, right before the CXR was done.   7:42 PM Dr. Darcey Nora returning page, will see pt now. Will cancel CT  abd/chest for now, given the finding of PTX.   9:21 PM Dr. Prescott Gum down to see pt, admitting him now. Labs reveal chronic leukopenia, chronic anemia better than several days ago (Hgb 7), chronic thrombocytopenia. CMP with mildly elevated Cr 1.35. Lipase WNL. Lactic WNL. Trop neg. Please see Dr. Lucianne Lei Trigt's notes for further documentation of care.   BP 134/82 mmHg  Pulse 90  Temp(Src) 98.4 F (36.9 C) (Oral)  Resp 19  SpO2 100%  Meds ordered this encounter  Medications  . albuterol (PROVENTIL) (2.5 MG/3ML) 0.083% nebulizer solution 5 mg    Sig:   . ipratropium (ATROVENT) nebulizer solution 0.5 mg    Sig:   . predniSONE (DELTASONE) tablet 60 mg    Sig:   . ondansetron (ZOFRAN) injection 4 mg    Sig:   . HYDROmorphone (DILAUDID) injection 0.5 mg    Sig:   . ondansetron (ZOFRAN-ODT) disintegrating tablet 4 mg    Sig:   . lidocaine-prilocaine (EMLA) cream    Sig:   . midazolam (VERSED) 5 MG/5ML injection 4 mg    Sig:   . fentaNYL (SUBLIMAZE) injection 50 mcg    Sig:   . lidocaine (PF) (XYLOCAINE) 1 % injection 5 mL    Sig:   . sodium chloride 0.9 % injection 10-40 mL    Sig:   . midazolam (VERSED) 2 MG/2ML injection    Sig:     Billie Ruddy   : cabinet override  . midazolam (VERSED) injection    Sig:   . fentaNYL (SUBLIMAZE) injection    Sig:      Mercedes Camprubi-Soms, PA-C 10/09/14 2122  Harvel Quale, MD 10/15/14 [phone]

## 2014-10-09 NOTE — ED Notes (Signed)
Per EMS: Pt complaining of SOB. Port placed Thursday, began feeling SOB on Friday. Pt has vomitted x7. + Diarrehea. Hx collapsed lung on L. Bone cancer, MI in 2010.

## 2014-10-09 NOTE — ED Notes (Addendum)
Family and patient do not want a peripheral stick; want his port accessed. D/W Dewitt Hoes, [place]. IV team consulted to advise.

## 2014-10-09 NOTE — Progress Notes (Signed)
  Subjective: Recurrent L spont pntx from earlier this month myelodyspasia- anemia Recent placement of R portacath  Objective: Vital signs in last 24 hours: Temp:  [98.4 F (36.9 C)] 98.4 F (36.9 C) (09/25 1829) Pulse Rate:  [79-99] 79 (09/25 1915) Cardiac Rhythm:  [-]  Resp:  [15-20] 20 (09/25 1915) BP: (103-148)/(80-89) 134/86 mmHg (09/25 1915) SpO2:  [97 %-100 %] 100 % (09/25 1915)  Hemodynamic parameters for last 24 hours:  O2 sat 99% 0n mask HR 100  Intake/Output from previous day:   Intake/Output this shift:    Increased RR Decreased BS on L Neuro intact Good pulses  Lab Results:  Recent Labs  10/09/14 1958  WBC 1.5*  HGB 7.0*  HCT 20.5*  PLT PENDING   BMET:  Recent Labs  10/09/14 1958  NA 137  K 4.3  CL 102  CO2 23  GLUCOSE 135*  BUN 17  CREATININE 1.35*  CALCIUM 8.8*    PT/INR: No results for input(s): LABPROT, INR in the last 72 hours. ABG    Component Value Date/Time   PHART 7.427 07/02/2013 0601   HCO3 23.8 07/02/2013 0601   TCO2 24 07/02/2013 0610   O2SAT 97.8 07/02/2013 0601   CBG (last 3)  No results for input(s): GLUCAP in the last 72 hours.  Assessment/Plan: S/P  recurrent L spont pneumothorax Left chest tube to be placed in ED Admit for VATS later this week Transfusion therapy for myelodysplasia      Tharon Aquas Trigt III 10/09/2014

## 2014-10-09 NOTE — H&P (Signed)
[address]       [place] [address]             [phone]        Emmanual E Roeper Anthony Medical Record #[id number] Date of Birth: 01-31-1945  Referring: No ref. provider found Primary Care: Shirline Frees, MD  Chief Complaint:    Chief Complaint  Patient presents with  . Shortness of Breath  . Nausea   patient examined, chest x-rays reviewed  History of Present Illness:     69 year old Caucasian male reformed smoker with history of spontaneous left pneumothorax requiring chest tube 3 weeks ago return to the emergency department with acute onset of shortness of breath and left chest pain. Chest x-ray showed a 80-90 percent left spontaneous pneumothorax. No preceding events false lifting or extreme coughing.  The patient is currently in the process of being treated for myelodysplasia with chemotherapy directed by Dr.Ennever--the patient has severe anemia thrombocytopenia and leukopenia. He states he's had some productive cough recently and low-grade fever.   Current Activity/ Functional Status: Patient very sedentary cannot tolerate exercise well   Zubrod Score: At the time of surgery this patient's most appropriate activity status/level should be described as: _0     0    Normal activity, no symptoms _1     1    Restricted in physical strenuous activity but ambulatory, able to do out light work _2     2    Ambulatory and capable of self care, unable to do work activities, up and about                 more than 50%  Of the time                            _3     3    Only limited self care, in bed greater than 50% of waking hours _4     4    Completely disabled, no self care, confined to bed or chair _5     5    Moribund  Past Medical History  Diagnosis Date  . Coronary atherosclerosis of native coronary artery     a. 10/2008 inf STEMI/PCI: LM 50d (IVUS-borderline lesion->med rx), LAD min irregs, LCX 26m 70d, OM nl, RCA [id number] [facility] BMS);  b.  11/2008 Lexiscan MV: EF 65%, no isch/scar.  . Essential hypertension, benign   . Pure hypercholesterolemia   . AAA (abdominal aortic aneurysm)     a. 05/2013 CT: 5.8 cm AAA.  . Marland Kitcheniverticulitis     a. 05/2013 CT: descending/sigmoid jxn w/o abscess.  . Osteoarthritis   . Tobacco abuse     a. ongoing - 1ppd for better part of 50 yrs.  . Normocytic anemia   . Cellulitis 06/10/2013    Right antecubital fossa at site of IV  03/12/13  . PONV (postoperative nausea and vomiting)   . Diverticulitis   . GERD (gastroesophageal reflux disease)   . MDS (myelodysplastic syndrome), high grade 04/13/2014  . Myocardial infarction 2010  . Depression   . Spontaneous pneumothorax 09/14/2014    LEFT LUNG  . Hypotestosteronemia 10/04/2014    Past Surgical History  Procedure Laterality Date  . Cardiac stents  2010  . Cholecystectomy N/A 06/19/2013    Procedure: LAPAROSCOPIC CHOLECYSTECTOMY;  Surgeon: DoHarl BowieMD;  [facility]: General;  Laterality: N/A;  . Abdominal aortic endovascular stent  graft N/A 07/02/2013    Procedure: ABDOMINAL AORTIC ENDOVASCULAR STENT GRAFT;  Surgeon: Serafina Mitchell, MD;  Location: [facility] OR;  Service: Vascular;  Laterality: N/A;  . Esophagogastroduodenoscopy N/A 08/05/2013    Procedure: ESOPHAGOGASTRODUODENOSCOPY (EGD);  Surgeon: Lear Ng, MD;  Location[facility] ENDOSCOPY;  Service: Endoscopy;  Laterality: N/A;  . Eye surgery Left     cataract surgery  . Back surgery      cervical fusion  . Colonoscopy with propofol N/A 10/15/2013    Procedure: COLONOSCOPY WITH PROPOFOL;  Surgeon: Lear Ng, MD;  Location: [facility];  Service: Endoscopy;  Laterality: N/A;  . Fetal blood transfusion  March 16,17,18, 2016  . Bone marrow biopsy  April 01, 2014  . Chest tube insertion  09/14/2014    History  Smoking status  . Former Smoker -- 1.00 packs/day for 60 years  . Types: Cigarettes  . Start date: 04/05/1964  Smokeless tobacco  . Never Used     Comment: 09/14/2014   History  Alcohol Use No    Social History   Social History  . Marital Status: Married    Spouse Name: N/A  . Number of Children: N/A  . Years of Education: N/A   Occupational History  . Not on file.   Social History Main Topics  . Smoking status: Former Smoker -- 1.00 packs/day for 60 years    Types: Cigarettes    Start date: 04/05/1964  . Smokeless tobacco: Never Used     Comment: 09/14/2014  . Alcohol Use: No  . Drug Use: No  . Sexual Activity: Not on file   Other Topics Concern  . Not on file   Social History Narrative   Lives in [place] with wife.  Retired Furniture conservator/restorer.  Works out in the yard often - limited by claudication.  Does not routinely exercise.    Allergies  Allergen Reactions  . Cephalexin Other (See Comments)    Nausea and stomach cramps  . Percocet [Oxycodone-Acetaminophen] Itching    Can take with benadryl    . Phenergan [Promethazine Hcl] Other (See Comments)    "restless legs"  . Vicodin [Hydrocodone-Acetaminophen] Itching    Can take with benadryl     Current Facility-Administered Medications  Medication Dose Route Frequency Provider Last Rate Last Dose  . ALPRAZolam Duanne Moron) tablet 0.25 mg  0.25 mg Oral TID PRN Ivin Poot, MD      . alum & mag hydroxide-simeth (MAALOX/MYLANTA) 200-200-20 MG/5ML suspension 30 mL  30 mL Oral Q6H PRN Ivin Poot, MD      . dextrose 5 % and 0.45 % NaCl with KCl 10 mEq/L infusion   Intravenous Continuous Ivin Poot, MD      . docusate sodium (COLACE) capsule 100 mg  100 mg Oral BID Ivin Poot, MD      . fentaNYL (SUBLIMAZE) injection 50 mcg  50 mcg Intravenous Q2H PRN Ivin Poot, MD      . ketotifen (ZADITOR) 0.025 % ophthalmic solution 2 drop  2 drop Both Eyes BID Ivin Poot, MD      . LORazepam (ATIVAN) bolus via infusion 0.5 mg  0.5 mg Intravenous Q4H PRN Ivin Poot, MD      . metoprolol tartrate (LOPRESSOR) tablet 12.5 mg  12.5 mg Oral BID Ivin Poot, MD       . midazolam (VERSED) 2 MG/2ML injection           . nitroGLYCERIN (NITROSTAT) SL tablet  0.4 mg  0.4 mg Sublingual Q5 min PRN Ivin Poot, MD      . ondansetron [facility]) tablet 4 mg  4 mg Oral Q6H PRN Ivin Poot, MD       Or  . ondansetron [facility]) injection 4 mg  4 mg Intravenous Q6H PRN Ivin Poot, MD      . Derrill Memo ON 10/10/2014] pantoprazole (PROTONIX) EC tablet 40 mg  40 mg Oral Q1200 Ivin Poot, MD      . Derrill Memo ON 10/10/2014] predniSONE (DELTASONE) tablet 20 mg  20 mg Oral BID WC Ivin Poot, MD      . predniSONE (DELTASONE) tablet 60 mg  60 mg Oral Once Mercedes Camprubi-Soms, PA-C      . prochlorperazine (COMPAZINE) tablet 10 mg  10 mg Oral Q6H PRN Ivin Poot, MD      . Derrill Memo ON 10/10/2014] simvastatin (ZOCOR) tablet 20 mg  20 mg Oral q1800 Ivin Poot, MD      . sodium chloride 0.9 % injection 10-40 mL  10-40 mL Intracatheter PRN Harvel Quale, MD   20 mL at 10/09/14 2124  . sorbitol 70 % solution 30 mL  30 mL Oral Daily PRN Ivin Poot, MD      . thiamine (B-1) injection 100 mg  100 mg Intravenous Daily Ivin Poot, MD       Current Outpatient Prescriptions  Medication Sig Dispense Refill  . acetaminophen (TYLENOL) 500 MG tablet Take 500 mg by mouth every 6 (six) hours as needed for fever.    . ALPRAZolam (XANAX) 0.25 MG tablet Take 1 tablet (0.25 mg total) by mouth 3 (three) times daily as needed for anxiety. 60 tablet 0  . aspirin EC 81 MG tablet Take 1 tablet (81 mg total) by mouth daily.    . carboxymethylcellulose (REFRESH TEARS) 0.5 % SOLN Place 2 drops into both eyes 3 (three) times daily as needed (For dry eyes.).    . Darbepoetin Alfa (ARANESP) 500 MCG/ML SOSY injection Inject 500 mcg into the skin once a week.    . lidocaine-prilocaine (EMLA) cream Apply 1 application topically as needed. (Patient taking differently: Apply 1 application topically as needed (For port-a-cath.). ) 30 g 0  . LORazepam (ATIVAN) 0.5 MG tablet Take 1  tablet (0.5 mg total) by mouth every 6 (six) hours as needed (Nausea or vomiting). 30 tablet 3  . metoprolol tartrate (LOPRESSOR) 25 MG tablet Take 0.5 tablets (12.5 mg total) by mouth 2 (two) times daily. 90 tablet 3  . nitroGLYCERIN (NITROSTAT) 0.4 MG SL tablet Place 1 tablet (0.4 mg total) under the tongue every 5 (five) minutes as needed for chest pain. 25 tablet 6  . oxyCODONE-acetaminophen (PERCOCET) 7.5-325 MG per tablet Take 1 tablet by mouth every 4 (four) hours as needed for severe pain. 30 tablet 0  . pantoprazole (PROTONIX) 40 MG tablet Take 40 mg by mouth at bedtime.     . predniSONE (DELTASONE) 20 MG tablet Take 10-40 mg by mouth daily as needed (For chemo prophylaxis.). He is to take two tablets daily for 6 days, one tablet daily for 3 days and then half a tablet daily for 5 days.    Marland Kitchen PRESCRIPTION MEDICATION His oncologist is Dr. Marin Olp and he receives his treatments at the [facility] in [facility]. He received his last dose of Vidaza 274m on 09/14/14.    .Marland Kitchenprochlorperazine (COMPAZINE) 10 MG tablet Take 1 tablet (10  mg total) by mouth every 6 (six) hours as needed (Nausea or vomiting). 30 tablet 1  . simvastatin (ZOCOR) 20 MG tablet Take 1 tablet (20 mg total) by mouth at bedtime. 30 tablet 5  . temazepam (RESTORIL) 15 MG capsule Take 15 mg by mouth at bedtime as needed for sleep.    Marland Kitchen testosterone cypionate (DEPOTESTOTERONE CYPIONATE) 100 MG/ML injection Inject 400 mg into the muscle every 28 (twenty-eight) days. For IM use only       (Not in a hospital admission)  Family History  Problem Relation Age of Onset  . Heart attack Brother     78s  . Cancer Father     Lung  . Cancer Mother     Brain  . Cancer Brother     Kidney     Review of Systems:       Cardiac Review of Systems: Y or N  Chest Pain [ yes   ]  Resting SOB [  yes ] Exertional SOB  [ yes ]  Orthopnea [ yes ]   Pedal Edema [no   ]    Palpitations [  no] Syncope  [ no ]   Presyncope [    ]  General Review of Systems: [Y] = yes [  ]=no Constitional: recent weight change [yes losing weight  ]; anorexia [yes. Related to chemotherapy  ]; fatigue [  yes]; nausea [  ]; night sweats [  ]; fever [ yes ]; or chills [  ]                                                               Dental: poor dentition[  ]; Last Dentist visit: Every 6 months   Eye : blurred vision [  ]; diplopia [   ]; vision changes [  ];  Amaurosis fugax[  ]; Resp: cough [ yes ];  wheezing[  ];  hemoptysis[  ]; shortness of breath[  ]; paroxysmal nocturnal dyspnea[  ]; dyspnea on exertion[yes  ]; or orthopnea[  ];  GI:  gallstones[  ], vomiting[yes-from chemotherapy  ];  dysphagia[  ]; melena[  ];  hematochezia [  ]; heartburn[  ];   Hx of  Colonoscopy[  ]; GU: kidney stones [  ]; hematuria[  ];   dysuria [  ];  nocturia[  ];  history of     obstruction [  ]; urinary frequency [  ]             Skin: rash, swelling[  ];, hair loss[  ];  peripheral edema[  ];  or itching[  ]; Musculosketetal: myalgias[  ];  joint swelling[  ];  joint erythema[  ];  joint pain[  ];  back pain[  ];  Heme/Lymph: bruising[yes ];  bleeding[  ];  anemia[  ];  Neuro: TIA[  ];  headaches[  ];  stroke[  ];  vertigo[  ];  seizures[  ];   paresthesias[  ];  difficulty walking[  ];  Psych:depression[  ]; anxiety[yes  ];  Endocrine: diabetes[  ];  thyroid dysfunction[  ];  Immunizations: Flu [  ]; Pneumococcal[  ];  Other: Port-A-Cath recently placed in right IJ by interventional radiology  Physical Exam: BP 139/79 mmHg  Pulse 88  Temp(Src) 98.4 F (36.9 C) (Oral)  Resp 16  SpO2 97%       Physical Exam  General: Middle-aged Caucasian male acute and chronically ill with nonrebreather oxygen mask HEENT: Normocephalic pupils equal , dentition adequate Neck: Supple without JVD, adenopathy, or bruit Chest: Reduced breath sounds on the left, scattered rhonchi and wheezes on the right, without tenderness or deformity          Cardiovascular:  Regular rate and rhythm, no murmur, no gallop, peripheral pulses             palpable in all extremities Abdomen:  Soft, nontender, no palpable mass or organomegaly Extremities: Warm, well-perfused, no clubbing cyanosis edema or tenderness,              no venous stasis changes of the legs Rectal/GU: Deferred Neuro: Grossly non--focal and symmetrical throughout Skin: Clean and dry without rash or ulceration   Diagnostic Studies & Laboratory data:     Recent Radiology Findings:   Dg Chest Portable 1 View  10/09/2014   CLINICAL DATA:  Chest tube placement  EXAM: PORTABLE CHEST 1 VIEW  COMPARISON:  Earlier today  FINDINGS: New left-sided chest tube with tip at the apex. No residual pneumothorax is seen. Paraseptal emphysema at the apices based on previous chest CT. No pleural fluid or collapse.  Diffuse interstitial coarsening. Normal heart size and stable aortic tortuosity when accounting for differences in technique. Right IJ porta catheter, tip stable at the lower SVC.  IMPRESSION: 1. Evacuated pneumothorax after left chest tube placement. 2. Paraseptal emphysema at the apices.   Electronically Signed   By: Monte Fantasia M.D.   On: 10/09/2014 21:48   Dg Chest Port 1 View  10/09/2014   CLINICAL DATA:  Shortness of breath. History of right Port-A-Cath placement on Thursday.  EXAM: PORTABLE CHEST 1 VIEW  COMPARISON:  PA and lateral chest 10/04/2014.  FINDINGS: The patient has a very large left pneumothorax. Right IJ approach Port-A-Cath is in place with the tip projecting at the superior cavoatrial junction. The right lung is expanded and clear. Heart size is normal. There is no mediastinal shift. No focal bony abnormality.  IMPRESSION: Very large left pneumothorax. Critical Value/emergent results were called by telephone at the time of interpretation on 10/09/2014 at 7:16 pm to MERCEDES CAMPRUBI-SOMS. P.A., who verbally acknowledged these results.   Electronically Signed   By: Inge Rise M.D.    On: 10/09/2014 19:17     I have independently reviewed the above radiologic studies.  Recent Lab Findings: Lab Results  Component Value Date   WBC 1.5* 10/09/2014   HGB 7.0* 10/09/2014   HCT 20.5* 10/09/2014   PLT 31* 10/09/2014   GLUCOSE 135* 10/09/2014   CHOL 93 05/26/2014   TRIG 127.0 05/26/2014   HDL 28.80* 05/26/2014   LDLDIRECT 55.2 10/28/2012   LDLCALC 39 05/26/2014   ALT 18 10/09/2014   AST 20 10/09/2014   NA 137 10/09/2014   K 4.3 10/09/2014   CL 102 10/09/2014   CREATININE 1.35* 10/09/2014   BUN 17 10/09/2014   CO2 23 10/09/2014   TSH 1.550 06/05/2013   INR 1.26 10/06/2014   HGBA1C 5.6 06/05/2013      Assessment / Plan:     Recurrent spontaneous left pneumothorax with respiratory distress Patient will need left VATS and stapling of blebs with pleurodesis to prevent recurrent left pneumothorax.   70 French left chest tube was placed in the left pleural space with reexpansion of  the lung on follow-up chest x-ray.    patient will be admitted for chest tube management and to be prepared for probable VATS with blood product transfusion. He'll be placed on antibiotics for his productive cough fever and probable bronchopneumonia.     _0 @ 10/09/2014 9:59 PM

## 2014-10-10 ENCOUNTER — Ambulatory Visit: Payer: Medicare Other | Admitting: Hematology & Oncology

## 2014-10-10 ENCOUNTER — Ambulatory Visit: Payer: Medicare Other

## 2014-10-10 ENCOUNTER — Inpatient Hospital Stay (HOSPITAL_COMMUNITY): Payer: Medicare Other

## 2014-10-10 ENCOUNTER — Other Ambulatory Visit: Payer: Medicare Other

## 2014-10-10 DIAGNOSIS — J9383 Other pneumothorax: Principal | ICD-10-CM

## 2014-10-10 DIAGNOSIS — D469 Myelodysplastic syndrome, unspecified: Secondary | ICD-10-CM

## 2014-10-10 LAB — CBC
HCT: 16.6 % — ABNORMAL LOW (ref 39.0–52.0)
HCT: 16.3 % — ABNORMAL LOW (ref 39.0–52.0)
Hemoglobin: 5.5 g/dL — CL (ref 13.0–17.0)
Hemoglobin: 5.4 g/dL — CL (ref 13.0–17.0)
MCH: 28.2 pg (ref 26.0–34.0)
MCH: 28.3 pg (ref 26.0–34.0)
MCHC: 33.1 g/dL (ref 30.0–36.0)
MCHC: 33.1 g/dL (ref 30.0–36.0)
MCV: 85.1 fL (ref 78.0–100.0)
MCV: 85.3 fL (ref 78.0–100.0)
Platelets: 23 10*3/uL — CL (ref 150–400)
Platelets: 21 10*3/uL — CL (ref 150–400)
RBC: 1.95 MIL/uL — ABNORMAL LOW (ref 4.22–5.81)
RBC: 1.91 MIL/uL — ABNORMAL LOW (ref 4.22–5.81)
RDW: 14.1 % (ref 11.5–15.5)
RDW: 14.1 % (ref 11.5–15.5)
WBC: 0.8 10*3/uL — CL (ref 4.0–10.5)
WBC: 0.8 10*3/uL — CL (ref 4.0–10.5)

## 2014-10-10 LAB — URINALYSIS, ROUTINE W REFLEX MICROSCOPIC
GLUCOSE, UA: NEGATIVE mg/dL
HGB URINE DIPSTICK: NEGATIVE
Ketones, ur: 15 mg/dL — AB
LEUKOCYTES UA: NEGATIVE
Nitrite: NEGATIVE
Protein, ur: 30 mg/dL — AB
SPECIFIC GRAVITY, URINE: 1.022 (ref 1.005–1.030)
UROBILINOGEN UA: 1 mg/dL (ref 0.0–1.0)
pH: 5.5 (ref 5.0–8.0)

## 2014-10-10 LAB — COMPREHENSIVE METABOLIC PANEL
ALT: 15 U/L — ABNORMAL LOW (ref 17–63)
AST: 15 U/L (ref 15–41)
Albumin: 3 g/dL — ABNORMAL LOW (ref 3.5–5.0)
Alkaline Phosphatase: 59 U/L (ref 38–126)
Anion gap: 7 (ref 5–15)
BUN: 16 mg/dL (ref 6–20)
CO2: 23 mmol/L (ref 22–32)
Calcium: 8.1 mg/dL — ABNORMAL LOW (ref 8.9–10.3)
Chloride: 105 mmol/L (ref 101–111)
Creatinine, Ser: 1.16 mg/dL (ref 0.61–1.24)
GFR calc Af Amer: >60 mL/min (ref >60)
GFR calc non Af Amer: >60 mL/min (ref >60)
Glucose, Bld: 161 mg/dL — ABNORMAL HIGH (ref 65–99)
Potassium: 4.6 mmol/L (ref 3.5–5.1)
Sodium: 135 mmol/L (ref 135–145)
Total Bilirubin: 0.4 mg/dL (ref 0.3–1.2)
Total Protein: 6.4 g/dL — ABNORMAL LOW (ref 6.5–8.1)

## 2014-10-10 LAB — URINE MICROSCOPIC-ADD ON

## 2014-10-10 LAB — PROTIME-INR
INR: 1.4 (ref 0.00–1.49)
Prothrombin Time: 17.3 seconds — ABNORMAL HIGH (ref 11.6–15.2)

## 2014-10-10 LAB — PREPARE RBC (CROSSMATCH)

## 2014-10-10 LAB — MRSA PCR SCREENING: MRSA by PCR: NEGATIVE

## 2014-10-10 LAB — APTT: aPTT: 37 seconds (ref 24–37)

## 2014-10-10 MED ORDER — SODIUM CHLORIDE 0.9 % IV SOLN
Freq: Once | INTRAVENOUS | Status: AC
  Administered 2014-10-10: 19:00:00 via INTRAVENOUS

## 2014-10-10 MED ORDER — DEXTROSE 5 % IV SOLN
2000.0000 mg | Freq: Three times a day (TID) | INTRAVENOUS | Status: DC
  Administered 2014-10-10 – 2014-10-11 (×4): 2000 mg via INTRAVENOUS
  Filled 2014-10-10 (×6): qty 2

## 2014-10-10 MED ORDER — FILGRASTIM 480 MCG/1.6ML IJ SOLN
480.0000 ug | Freq: Every day | INTRAMUSCULAR | Status: DC
  Administered 2014-10-10 – 2014-10-17 (×6): 480 ug via SUBCUTANEOUS
  Filled 2014-10-10 (×9): qty 1.6

## 2014-10-10 MED ORDER — DRONABINOL 2.5 MG PO CAPS
5.0000 mg | ORAL_CAPSULE | Freq: Every day | ORAL | Status: DC
  Administered 2014-10-14: 5 mg via ORAL
  Filled 2014-10-10 (×2): qty 2

## 2014-10-10 MED ORDER — VITAMIN B-1 100 MG PO TABS
100.0000 mg | ORAL_TABLET | Freq: Every day | ORAL | Status: DC
  Administered 2014-10-12 – 2014-10-17 (×6): 100 mg via ORAL
  Filled 2014-10-10 (×7): qty 1

## 2014-10-10 MED ORDER — ENSURE ENLIVE PO LIQD
237.0000 mL | Freq: Two times a day (BID) | ORAL | Status: DC
  Administered 2014-10-10: 237 mL via ORAL

## 2014-10-10 MED ORDER — DIPHENHYDRAMINE HCL 25 MG PO CAPS
25.0000 mg | ORAL_CAPSULE | Freq: Four times a day (QID) | ORAL | Status: DC | PRN
  Administered 2014-10-10 – 2014-10-11 (×4): 25 mg via ORAL
  Filled 2014-10-10 (×4): qty 1

## 2014-10-10 MED ORDER — NICOTINE 7 MG/24HR TD PT24
7.0000 mg | MEDICATED_PATCH | Freq: Every day | TRANSDERMAL | Status: DC
  Administered 2014-10-10: 7 mg via TRANSDERMAL
  Filled 2014-10-10 (×2): qty 1

## 2014-10-10 MED ORDER — SODIUM CHLORIDE 0.9 % IV SOLN
Freq: Once | INTRAVENOUS | Status: AC
  Administered 2014-10-10: 13:00:00 via INTRAVENOUS

## 2014-10-10 NOTE — Progress Notes (Addendum)
      [address]       [place] [address]             [phone]      Subjective:  Patient states he feels okay.  He is sore at chest tube site.  He does ask if he will require surgery during this hospitalization.  Objective: Vital signs in last 24 hours: Temp:  [97.7 F (36.5 C)-98.9 F (37.2 C)] 97.7 F (36.5 C) (09/26 0700) Pulse Rate:  [65-99] 65 (09/26 0306) Cardiac Rhythm:  [-] Bundle branch block;Heart block (09/26 0700) Resp:  [12-22] 12 (09/26 0306) BP: (99-148)/(66-94) 99/66 mmHg (09/26 0306) SpO2:  [97 %-100 %] 100 % (09/26 0306) Weight:  [181 lb (82.1 kg)] 181 lb (82.1 kg) (09/25 2321)  Intake/Output from previous day: 09/25 0701 - 09/26 0700 In: 380 [P.O.:120; I.V.:210; IV Piggyback:50] Out: 250 [Urine:250]  General appearance: alert, cooperative and no distress Heart: regular rate and rhythm Lungs: clear to auscultation bilaterally Abdomen: soft, non-tender; bowel sounds normal; no masses,  no organomegaly Wound: clean and dry  Lab Results:  Recent Labs  10/10/14 0540 10/10/14 0655  WBC 0.8* 0.8*  HGB 5.5* 5.4*  HCT 16.6* 16.3*  PLT 23* 21*   BMET:  Recent Labs  10/09/14 1958 10/10/14 0540  NA 137 135  K 4.3 4.6  CL 102 105  CO2 23 23  GLUCOSE 135* 161*  BUN 17 16  CREATININE 1.35* 1.16  CALCIUM 8.8* 8.1*    PT/INR:  Recent Labs  10/10/14 0540  LABPROT 17.3*  INR 1.40   ABG    Component Value Date/Time   PHART 7.427 07/02/2013 0601   HCO3 23.8 07/02/2013 0601   TCO2 24 07/02/2013 0610   O2SAT 97.8 07/02/2013 0601   CBG (last 3)  No results for input(s): GLUCAP in the last 72 hours.  Assessment/Plan:  1. Recurrent Pneumothorax-chest tube in place, + air leak with cough, CXR with trace pneumothorax 2. CV-hemodynamically stable 3. Dispo- leave chest tube on suction, repeat CXR in AM, likely VATS at some point   LOS: 1 day    [name], ERIN 10/10/2014  Patient examined and today's chest x-ray  reviewed Persistent air leak through chest tube Review of chest CT scan from admission earlier this month shows a large apical blebs in left lung. With recurrent 80-90 percent left pneumothorax within 2-3 weeks after the initial event his likelihood of recurring pneumothorax is very high and he has been recommended to have VATS for definitive therapy and resection of blebs. He has received blood transfusions by Dr.Enever and will receive platelet transfusion this evening. Plan left VATS for resection of blebs and pleurodesis in a.m. Procedure indications benefits and risks discussed in detail with patient and his family.  patient examined and medical record reviewed,agree with above note. Tharon Aquas Trigt III 10/10/2014

## 2014-10-10 NOTE — Progress Notes (Signed)
This was accidentally scanned under platelets but should have been PRBC's. This is unit 2 of 2 PRBC's.

## 2014-10-10 NOTE — Progress Notes (Addendum)
Initial Nutrition Assessment  DOCUMENTATION CODES:   Not applicable  INTERVENTION:   Ensure Enlive po BID, each supplement provides 350 kcal and 20 grams of protein  NUTRITION DIAGNOSIS:   Increased nutrient needs related to  (post op healing) as evidenced by estimated needs  GOAL:   Patient will meet greater than or equal to 90% of their needs  MONITOR:   PO intake, Supplement acceptance, Labs, Weight trends, I & O's  REASON FOR ASSESSMENT:   Malnutrition Screening Tool  ASSESSMENT:   69 y.o. Male with a PMHx of CAD and MI s/p stent, HTN, HLD, AAA, diverticulitis, anemia, myelodysplastic syndrome, and recent spontaneous pneumothorax on 8/31 requiring chest tube insertion, who presents to the ED with complaints of gradual onset shortness breath 2 days.   Patient s/p procedure 9/25: PLACEMENT OF LEFT CHEST TUBE  Patient reports he has no appetite.  Was drinking chocolate Ensure supplements at home PTA.  Reveals he's lost "20 some" pounds.  Per wt readings below, pt has had a 7% weight loss in the last 6 months (not significant for time frame).  Amenable to RD ordering during hospitalization.  Nutrition focused physical exam completed.  No muscle or subcutaneous fat depletion noticed.  Diet Order:  Diet Heart Room service appropriate?: Yes; Fluid consistency:: Thin  Skin:  Reviewed, no issues  Last BM:  9/25  Height:   Ht Readings from Last 1 Encounters:  10/09/14 5\' 10"  (1.778 m)    Weight:   Wt Readings from Last 1 Encounters:  10/09/14 181 lb (82.1 kg)   Wt Readings from Last 20 Encounters:  10/09/14 181 lb (82.1 kg)  10/04/14 181 lb (82.101 kg)  10/03/14 181 lb (82.101 kg)  09/14/14 194 lb 0.1 oz (88 kg)  09/02/14 195 lb (88.451 kg)  08/15/14 194 lb (87.998 kg)  08/09/14 185 lb (83.915 kg)  07/04/14 191 lb (86.637 kg)  06/10/14 193 lb (87.544 kg)  06/06/14 193 lb (87.544 kg)  05/27/14 193 lb (87.544 kg)  05/09/14 194 lb (87.998 kg)  04/06/14 195  lb (88.451 kg)  03/30/14 197 lb (89.359 kg)  03/30/14 200 lb 6.4 oz (90.901 kg)  01/21/14 191 lb 6.4 oz (86.818 kg)  11/22/13 184 lb 14.4 oz (83.87 kg)  10/15/13 168 lb (76.204 kg)  08/23/13 168 lb (76.204 kg)  08/06/13 168 lb 4.8 oz (76.34 kg)    Ideal Body Weight:  75.4 kg  BMI:  Body mass index is 25.97 kg/(m^2).  Estimated Nutritional Needs:   Kcal:  2000-2200  Protein:  100-110 gm  Fluid:  2.0-2.2 L  EDUCATION NEEDS:   No education needs identified at this time  Arthur Holms, RD, LDN Pager #: [phone] After-Hours Pager #: [phone]

## 2014-10-10 NOTE — Consult Note (Signed)
Referral MD  Reason for Referral: Myelodysplasia, recurrent left pneumothorax   Chief Complaint  Patient presents with  . Shortness of Breath  . Nausea  : I have another pneumothorax  HPI: [name] is well-known to me. He is a 69 year old male with high-grade myelodysplasia. He was to start [place] today.  He recently was hospitalized for a left pneumothorax. He had a Port-A-Cath placed last week. Over the weekend, he had recurrence of the pneumothorax. This is on the left side. A chest tube was placed.  He needs to have have surgery for this now.  As always for him, whenever his body is under stress, his bone marrow shuts down. His hemoglobin is 5.4. White cell cancer 0.8. Platelet count 21,000.  He's had no obvious bleeding.  To complicated matters, he developed a DVT in the left leg back in early September. He did have a filter placed.  I did give him a dose of testosterone the office last week area did his levels have been quite low.  He stopped smoking. He's having some issues with nicotine with this. I will go ahead and put him on a Nicoderm patch.  He's had no change in bowel or bladder habits. His appetite is down. I think I had him on Marinol previously.   Past Medical History  Diagnosis Date  . Coronary atherosclerosis of native coronary artery     a. 10/2008 inf STEMI/PCI: LM 50d (IVUS-borderline lesion->med rx), LAD min irregs, LCX 41m, 70d, OM nl, RCA 138m (3.5x28 Vision BMS);  b. 11/2008 Lexiscan MV: EF 65%, no isch/scar.  . Essential hypertension, benign   . Pure hypercholesterolemia   . AAA (abdominal aortic aneurysm)     a. 05/2013 CT: 5.8 cm AAA.  Marland Kitchen Diverticulitis     a. 05/2013 CT: descending/sigmoid jxn w/o abscess.  . Osteoarthritis   . Tobacco abuse     a. ongoing - 1ppd for better part of 50 yrs.  . Normocytic anemia   . Cellulitis 06/10/2013    Right antecubital fossa at site of IV  03/12/13  . PONV (postoperative nausea and vomiting)   .  Diverticulitis   . GERD (gastroesophageal reflux disease)   . MDS (myelodysplastic syndrome), high grade 04/13/2014  . Myocardial infarction 2010  . Depression   . Spontaneous pneumothorax 09/14/2014    LEFT LUNG  . Hypotestosteronemia 10/04/2014  :  Past Surgical History  Procedure Laterality Date  . Cardiac stents  2010  . Cholecystectomy N/A 06/19/2013    Procedure: LAPAROSCOPIC CHOLECYSTECTOMY;  Surgeon: Harl Bowie, MD;  Location: [facility];  Service: General;  Laterality: N/A;  . Abdominal aortic endovascular stent graft N/A 07/02/2013    Procedure: ABDOMINAL AORTIC ENDOVASCULAR STENT GRAFT;  Surgeon: Serafina Mitchell, MD;  Location[facility] OR;  Service: Vascular;  Laterality: N/A;  . Esophagogastroduodenoscopy N/A 08/05/2013    Procedure: ESOPHAGOGASTRODUODENOSCOPY (EGD);  Surgeon: Lear Ng, MD;  Location: [facility] ENDOSCOPY;  Service: Endoscopy;  Laterality: N/A;  . Eye surgery Left     cataract surgery  . Back surgery      cervical fusion  . Colonoscopy with propofol N/A 10/15/2013    Procedure: COLONOSCOPY WITH PROPOFOL;  Surgeon: Lear Ng, MD;  Location: [facility];  Service: Endoscopy;  Laterality: N/A;  . Fetal blood transfusion  March 16,17,18, 2016  . Bone marrow biopsy  April 01, 2014  . Chest tube insertion  09/14/2014  :   Current facility-administered medications:  .  ALPRAZolam Duanne Moron)  tablet 0.25 mg, 0.25 mg, Oral, TID PRN, Kerin Perna, MD, 0.25 mg at 10/10/14 0232 .  alum & mag hydroxide-simeth (MAALOX/MYLANTA) 200-200-20 MG/5ML suspension 30 mL, 30 mL, Oral, Q6H PRN, Kerin Perna, MD .  dextrose 5 % and 0.45 % NaCl with KCl 10 mEq/L infusion, , Intravenous, Continuous, Kerin Perna, MD, Last Rate: 30 mL/hr at 10/10/14 0600 .  diphenhydrAMINE (BENADRYL) capsule 25 mg, 25 mg, Oral, Q6H PRN, Kerin Perna, MD, 25 mg at 10/10/14 0232 .  docusate sodium (COLACE) capsule 100 mg, 100 mg, Oral, BID, Kerin Perna, MD, 100 mg at 10/10/14  0000 .  fentaNYL (SUBLIMAZE) injection 50 mcg, 50 mcg, Intravenous, Q2H PRN, Kerin Perna, MD, 50 mcg at 10/10/14 0700 .  filgrastim (NEUPOGEN) injection 480 mcg, 480 mcg, Subcutaneous, Daily, Josph Macho, MD .  ketotifen (ZADITOR) 0.025 % ophthalmic solution 2 drop, 2 drop, Both Eyes, BID, Kerin Perna, MD, 2 drop at 10/09/14 2347 .  levofloxacin (LEVAQUIN) IVPB 500 mg, 500 mg, Intravenous, QHS, Kerin Perna, MD, 500 mg at 10/09/14 2342 .  LORazepam (ATIVAN) injection 0.5 mg, 0.5 mg, Intravenous, Q4H PRN, Kerin Perna, MD .  metoprolol tartrate (LOPRESSOR) tablet 12.5 mg, 12.5 mg, Oral, BID, Kerin Perna, MD, 12.5 mg at 10/10/14 0000 .  midazolam (VERSED) 2 MG/2ML injection, , , ,  .  nitroGLYCERIN (NITROSTAT) SL tablet 0.4 mg, 0.4 mg, Sublingual, Q5 min PRN, Kerin Perna, MD .  ondansetron [facility]) tablet 4 mg, 4 mg, Oral, Q6H PRN **OR** ondansetron (ZOFRAN) injection 4 mg, 4 mg, Intravenous, Q6H PRN, Kerin Perna, MD .  pantoprazole (PROTONIX) EC tablet 40 mg, 40 mg, Oral, Q1200, Kerin Perna, MD .  predniSONE (DELTASONE) tablet 20 mg, 20 mg, Oral, BID WC, Kerin Perna, MD .  prochlorperazine (COMPAZINE) tablet 10 mg, 10 mg, Oral, Q6H PRN, Kerin Perna, MD .  simvastatin (ZOCOR) tablet 20 mg, 20 mg, Oral, q1800, Kerin Perna, MD .  sodium chloride 0.9 % injection 10-40 mL, 10-40 mL, Intracatheter, PRN, Leta Baptist, MD, 20 mL at 10/09/14 2124 .  sorbitol 70 % solution 30 mL, 30 mL, Oral, Daily PRN, Kerin Perna, MD .  thiamine (B-1) injection 100 mg, 100 mg, Intravenous, Daily, Kerin Perna, MD:  . docusate sodium  100 mg Oral BID  . filgrastim  480 mcg Subcutaneous Daily  . ketotifen  2 drop Both Eyes BID  . levofloxacin (LEVAQUIN) IV  500 mg Intravenous QHS  . metoprolol tartrate  12.5 mg Oral BID  . midazolam      . pantoprazole  40 mg Oral Q1200  . predniSONE  20 mg Oral BID WC  . simvastatin  20 mg Oral q1800  . thiamine  100 mg  Intravenous Daily  :  Allergies  Allergen Reactions  . Cephalexin Other (See Comments)    Nausea and stomach cramps  . Percocet [Oxycodone-Acetaminophen] Itching    Can take with benadryl    . Phenergan [Promethazine Hcl] Other (See Comments)    "restless legs"  . Vicodin [Hydrocodone-Acetaminophen] Itching    Can take with benadryl   :  Family History  Problem Relation Age of Onset  . Heart attack Brother     60s  . Cancer Father     Lung  . Cancer Mother     Brain  . Cancer Brother     Kidney  :  Social History  Social History  . Marital Status: Married    Spouse Name: N/A  . Number of Children: N/A  . Years of Education: N/A   Occupational History  . Not on file.   Social History Main Topics  . Smoking status: Former Smoker -- 1.00 packs/day for 60 years    Types: Cigarettes    Start date: 04/05/1964  . Smokeless tobacco: Never Used     Comment: 09/14/2014  . Alcohol Use: No  . Drug Use: No  . Sexual Activity: Not on file   Other Topics Concern  . Not on file   Social History Narrative   Lives in [place] with wife.  Retired Furniture conservator/restorer.  Works out in the yard often - limited by claudication.  Does not routinely exercise.  :  Pertinent items are noted in HPI.  Exam: Patient Vitals for the past 24 hrs:  BP Temp Temp src Pulse Resp SpO2 Height Weight  10/10/14 0306 99/66 mmHg 98.1 F (36.7 C) - 65 12 100 % - -  10/10/14 0000 118/74 mmHg - - 83 - - - -  10/09/14 2321 118/74 mmHg 98.9 F (37.2 C) Oral 83 - 98 % $Re'5\' 10"'wPc$  (1.778 m) 181 lb (82.1 kg)  10/09/14 2215 121/81 mmHg - - 85 19 98 % - -  10/09/14 2200 134/73 mmHg - - 86 16 97 % - -  10/09/14 2145 139/79 mmHg - - 88 16 97 % - -  10/09/14 2130 137/79 mmHg - - 84 17 100 % - -  10/09/14 2125 118/90 mmHg - - 86 16 100 % - -  10/09/14 2120 135/76 mmHg - - 87 14 100 % - -  10/09/14 2115 120/78 mmHg - - 90 15 100 % - -  10/09/14 2110 134/82 mmHg - - 90 19 100 % - -  10/09/14 2105 139/79 mmHg - - 95 22 100  % - -  10/09/14 2100 136/94 mmHg - - 94 19 100 % - -  10/09/14 2055 131/85 mmHg - - 95 19 100 % - -  10/09/14 2050 145/85 mmHg - - 94 17 100 % - -  10/09/14 2030 141/88 mmHg - - 90 18 100 % - -  10/09/14 2000 146/79 mmHg - - 99 17 100 % - -  10/09/14 1945 144/94 mmHg - - 93 19 99 % - -  10/09/14 1915 134/86 mmHg - - 79 20 100 % - -  10/09/14 1900 103/80 mmHg - - 99 15 97 % - -  10/09/14 1829 148/89 mmHg 98.4 F (36.9 C) Oral 84 - 100 % - -    as above    Recent Labs  10/10/14 0540 10/10/14 0655  WBC PENDING 0.8*  HGB 5.5* 5.4*  HCT 16.6* 16.3*  PLT PENDING 21*    Recent Labs  10/09/14 1958 10/10/14 0540  NA 137 135  K 4.3 4.6  CL 102 105  CO2 23 23  GLUCOSE 135* 161*  BUN 17 16  CREATININE 1.35* 1.16  CALCIUM 8.8* 8.1*    Blood smear review: None  Pathology: None     Assessment and Plan:  [name] is a 69 year old gentleman with recurrent left pneumothorax. He will need surgery for this.  Again, whenever he gets under stress, his bone marrow shuts down. This is no surprise.  He probably will need to be transfused with platelets prior to the procedure. He probably will also need some red blood cells.  I'm going to start  him on Neupogen. I think this will help as white cells.  We will follow him along.  While he is in the hospital, I will also have him on Desferal. This may help with issues with iron overload that he certainly could run into because of transfusions.  I do appreciate all the great help that he is getting from everybody in the stepdown unit.  [name] E.  Ephesians 6:16

## 2014-10-10 NOTE — Progress Notes (Signed)
CRITICAL VALUE ALERT  Critical value received:  WBC 0.8, Hemoglobin 5.4, and  Platelets 21  Date of notification:  10/10/2014  Time of notification:  0720  Critical value read back:Yes.    Nurse who received alert:  K. Royden Purl  MD notified (1st page):  Roxan Hockey  Time of first page:  0720  MD notified (2nd page):  Time of second page:  Responding MD:  Roxan Hockey  Time MD responded:  59  New orders received to give 2 units of pRBCs

## 2014-10-11 ENCOUNTER — Encounter (HOSPITAL_COMMUNITY): Payer: Self-pay | Admitting: Certified Registered Nurse Anesthetist

## 2014-10-11 ENCOUNTER — Inpatient Hospital Stay (HOSPITAL_COMMUNITY): Payer: Medicare Other | Admitting: Anesthesiology

## 2014-10-11 ENCOUNTER — Ambulatory Visit: Payer: Medicare Other

## 2014-10-11 ENCOUNTER — Inpatient Hospital Stay (HOSPITAL_COMMUNITY): Payer: Medicare Other

## 2014-10-11 ENCOUNTER — Encounter (HOSPITAL_COMMUNITY)
Admission: EM | Disposition: A | Discharge status: Home / Self Care | Payer: Self-pay | Source: Home / Self Care | Attending: Cardiothoracic Surgery

## 2014-10-11 ENCOUNTER — Other Ambulatory Visit: Payer: Self-pay | Admitting: *Deleted

## 2014-10-11 DIAGNOSIS — D46Z Other myelodysplastic syndromes: Secondary | ICD-10-CM

## 2014-10-11 DIAGNOSIS — J9311 Primary spontaneous pneumothorax: Secondary | ICD-10-CM

## 2014-10-11 HISTORY — PX: STAPLING OF BLEBS: SHX6429

## 2014-10-11 HISTORY — PX: VIDEO ASSISTED THORACOSCOPY: SHX5073

## 2014-10-11 LAB — BLOOD GAS, ARTERIAL
ACID-BASE DEFICIT: 3.4 mmol/L — AB (ref 0.0–2.0)
Acid-base deficit: 2.6 mmol/L — ABNORMAL HIGH (ref 0.0–2.0)
Bicarbonate: 20.3 mEq/L (ref 20.0–24.0)
Bicarbonate: 22.2 mEq/L (ref 20.0–24.0)
DRAWN BY: 43752
FIO2: 0.21
O2 Content: 2 L/min
O2 SAT: 95.8 %
O2 Saturation: 92.8 %
PATIENT TEMPERATURE: 98.6
PO2 ART: 77.9 mmHg — AB (ref 80.0–100.0)
Patient temperature: 98.6
TCO2: 21.3 mmol/L (ref 0–100)
TCO2: 23.5 mmol/L (ref 0–100)
pCO2 arterial: 32 mmHg — ABNORMAL LOW (ref 35.0–45.0)
pCO2 arterial: 41.8 mmHg (ref 35.0–45.0)
pH, Arterial: 7.419 (ref 7.350–7.450)
pH, Arterial: 7.344 — ABNORMAL LOW (ref 7.350–7.450)
pO2, Arterial: 66.5 mmHg — ABNORMAL LOW (ref 80.0–100.0)

## 2014-10-11 LAB — COMPREHENSIVE METABOLIC PANEL
ALK PHOS: 57 U/L (ref 38–126)
ALT: 14 U/L — ABNORMAL LOW (ref 17–63)
ANION GAP: 8 (ref 5–15)
AST: 19 U/L (ref 15–41)
Albumin: 3 g/dL — ABNORMAL LOW (ref 3.5–5.0)
BILIRUBIN TOTAL: 0.7 mg/dL (ref 0.3–1.2)
BUN: 13 mg/dL (ref 6–20)
CALCIUM: 8.3 mg/dL — AB (ref 8.9–10.3)
CO2: 23 mmol/L (ref 22–32)
Chloride: 107 mmol/L (ref 101–111)
Creatinine, Ser: 1.23 mg/dL (ref 0.61–1.24)
GFR calc Af Amer: >60 mL/min (ref >60)
GFR, EST NON AFRICAN AMERICAN: 59 mL/min — AB (ref >60)
Glucose, Bld: 109 mg/dL — ABNORMAL HIGH (ref 65–99)
POTASSIUM: 4.1 mmol/L (ref 3.5–5.1)
Sodium: 138 mmol/L (ref 135–145)
TOTAL PROTEIN: 6.8 g/dL (ref 6.5–8.1)

## 2014-10-11 LAB — DIC (DISSEMINATED INTRAVASCULAR COAGULATION)PANEL
D-Dimer, Quant: 2.28 ug/mL-FEU — ABNORMAL HIGH (ref 0.00–0.48)
Fibrinogen: 500 mg/dL — ABNORMAL HIGH (ref 204–475)
INR: 1.32 (ref 0.00–1.49)
Platelets: 91 10*3/uL — ABNORMAL LOW (ref 150–400)
Prothrombin Time: 16.5 seconds — ABNORMAL HIGH (ref 11.6–15.2)
Smear Review: NONE SEEN
aPTT: 27 seconds (ref 24–37)

## 2014-10-11 LAB — CBC
HCT: 22.6 % — ABNORMAL LOW (ref 39.0–52.0)
HCT: 23.8 % — ABNORMAL LOW (ref 39.0–52.0)
Hemoglobin: 7.7 g/dL — ABNORMAL LOW (ref 13.0–17.0)
Hemoglobin: 8.1 g/dL — ABNORMAL LOW (ref 13.0–17.0)
MCH: 28.5 pg (ref 26.0–34.0)
MCH: 28 pg (ref 26.0–34.0)
MCHC: 34.1 g/dL (ref 30.0–36.0)
MCHC: 34 g/dL (ref 30.0–36.0)
MCV: 83.7 fL (ref 78.0–100.0)
MCV: 82.4 fL (ref 78.0–100.0)
Platelets: 55 10*3/uL — ABNORMAL LOW (ref 150–400)
Platelets: 91 10*3/uL — ABNORMAL LOW (ref 150–400)
RBC: 2.7 MIL/uL — ABNORMAL LOW (ref 4.22–5.81)
RBC: 2.89 MIL/uL — ABNORMAL LOW (ref 4.22–5.81)
RDW: 15.1 % (ref 11.5–15.5)
RDW: 15.3 % (ref 11.5–15.5)
WBC: 3.1 10*3/uL — ABNORMAL LOW (ref 4.0–10.5)
WBC: 3.6 10*3/uL — ABNORMAL LOW (ref 4.0–10.5)

## 2014-10-11 LAB — APTT: aPTT: 29 seconds (ref 24–37)

## 2014-10-11 LAB — PROTIME-INR
INR: 1.24 (ref 0.00–1.49)
Prothrombin Time: 15.8 seconds — ABNORMAL HIGH (ref 11.6–15.2)

## 2014-10-11 SURGERY — VIDEO ASSISTED THORACOSCOPY
Anesthesia: General | Site: Chest | Laterality: Left

## 2014-10-11 MED ORDER — SENNOSIDES-DOCUSATE SODIUM 8.6-50 MG PO TABS
1.0000 | ORAL_TABLET | Freq: Every day | ORAL | Status: DC
  Administered 2014-10-11 – 2014-10-16 (×5): 1 via ORAL
  Filled 2014-10-11 (×7): qty 1

## 2014-10-11 MED ORDER — HEMOSTATIC AGENTS (NO CHARGE) OPTIME
TOPICAL | Status: DC | PRN
  Administered 2014-10-11: 1 via TOPICAL

## 2014-10-11 MED ORDER — PHENYLEPHRINE HCL 10 MG/ML IJ SOLN
INTRAMUSCULAR | Status: DC | PRN
  Administered 2014-10-11 (×2): 40 ug via INTRAVENOUS

## 2014-10-11 MED ORDER — LIDOCAINE HCL (CARDIAC) 20 MG/ML IV SOLN
INTRAVENOUS | Status: DC | PRN
  Administered 2014-10-11: 100 mg via INTRAVENOUS

## 2014-10-11 MED ORDER — PREDNISONE 20 MG PO TABS
20.0000 mg | ORAL_TABLET | Freq: Two times a day (BID) | ORAL | Status: DC
  Administered 2014-10-12 – 2014-10-17 (×11): 20 mg via ORAL
  Filled 2014-10-11 (×13): qty 1

## 2014-10-11 MED ORDER — SODIUM CHLORIDE 0.9 % IJ SOLN: 9.0000 mL | INTRAMUSCULAR | Status: DC | PRN

## 2014-10-11 MED ORDER — PROPOFOL 10 MG/ML IV BOLUS
INTRAVENOUS | Status: DC | PRN
  Administered 2014-10-11: 100 mg via INTRAVENOUS
  Administered 2014-10-11: 50 mg via INTRAVENOUS

## 2014-10-11 MED ORDER — EPHEDRINE SULFATE 50 MG/ML IJ SOLN
INTRAMUSCULAR | Status: AC
  Filled 2014-10-11: qty 1

## 2014-10-11 MED ORDER — ONDANSETRON HCL 4 MG/2ML IJ SOLN
4.0000 mg | Freq: Four times a day (QID) | INTRAMUSCULAR | Status: DC | PRN
  Administered 2014-10-12 – 2014-10-17 (×5): 4 mg via INTRAVENOUS
  Filled 2014-10-11 (×5): qty 2

## 2014-10-11 MED ORDER — DIPHENHYDRAMINE HCL 50 MG/ML IJ SOLN
12.5000 mg | Freq: Four times a day (QID) | INTRAMUSCULAR | Status: DC | PRN
  Administered 2014-10-11: 12.5 mg via INTRAVENOUS
  Filled 2014-10-11: qty 1

## 2014-10-11 MED ORDER — HYDROMORPHONE HCL 1 MG/ML IJ SOLN
0.2500 mg | INTRAMUSCULAR | Status: DC | PRN
  Administered 2014-10-11 (×4): 0.5 mg via INTRAVENOUS

## 2014-10-11 MED ORDER — LACTATED RINGERS IV SOLN
INTRAVENOUS | Status: DC | PRN
  Administered 2014-10-11: 07:00:00 via INTRAVENOUS

## 2014-10-11 MED ORDER — ROCURONIUM BROMIDE 100 MG/10ML IV SOLN
INTRAVENOUS | Status: DC | PRN
  Administered 2014-10-11: 50 mg via INTRAVENOUS
  Administered 2014-10-11: 30 mg via INTRAVENOUS

## 2014-10-11 MED ORDER — MIDAZOLAM HCL 5 MG/5ML IJ SOLN
INTRAMUSCULAR | Status: DC | PRN
  Administered 2014-10-11 (×2): 1 mg via INTRAVENOUS

## 2014-10-11 MED ORDER — BISACODYL 5 MG PO TBEC
10.0000 mg | DELAYED_RELEASE_TABLET | Freq: Every day | ORAL | Status: DC
  Administered 2014-10-12 – 2014-10-17 (×4): 10 mg via ORAL
  Filled 2014-10-11 (×4): qty 2

## 2014-10-11 MED ORDER — FENTANYL CITRATE (PF) 250 MCG/5ML IJ SOLN
INTRAMUSCULAR | Status: AC
  Filled 2014-10-11: qty 5

## 2014-10-11 MED ORDER — DIPHENHYDRAMINE HCL 12.5 MG/5ML PO ELIX: 12.5000 mg | ORAL_SOLUTION | Freq: Four times a day (QID) | ORAL | Status: DC | PRN

## 2014-10-11 MED ORDER — METOCLOPRAMIDE HCL 5 MG/ML IJ SOLN
10.0000 mg | Freq: Four times a day (QID) | INTRAMUSCULAR | Status: DC
  Administered 2014-10-12 – 2014-10-13 (×4): 10 mg via INTRAVENOUS
  Filled 2014-10-11 (×13): qty 2

## 2014-10-11 MED ORDER — PROPOFOL 10 MG/ML IV BOLUS
INTRAVENOUS | Status: AC
  Filled 2014-10-11: qty 20

## 2014-10-11 MED ORDER — EPHEDRINE SULFATE 50 MG/ML IJ SOLN
INTRAMUSCULAR | Status: DC | PRN
  Administered 2014-10-11: 5 mg via INTRAVENOUS

## 2014-10-11 MED ORDER — ANTISEPTIC ORAL RINSE SOLUTION (CORINZ)
7.0000 mL | Freq: Four times a day (QID) | OROMUCOSAL | Status: DC
  Administered 2014-10-11 – 2014-10-16 (×16): 7 mL via OROMUCOSAL

## 2014-10-11 MED ORDER — DEXTROSE 5 % IV SOLN
2000.0000 mg | Freq: Three times a day (TID) | INTRAVENOUS | Status: DC
  Administered 2014-10-11 – 2014-10-17 (×18): 2000 mg via INTRAVENOUS
  Filled 2014-10-11 (×22): qty 2

## 2014-10-11 MED ORDER — LIDOCAINE-PRILOCAINE 2.5-2.5 % EX CREA: 1.0000 "application " | TOPICAL_CREAM | CUTANEOUS | Status: DC | PRN

## 2014-10-11 MED ORDER — TALC 5 G PL SUSR
INTRAPLEURAL | Status: AC
  Filled 2014-10-11: qty 5

## 2014-10-11 MED ORDER — ACETAMINOPHEN 500 MG PO TABS
1000.0000 mg | ORAL_TABLET | Freq: Four times a day (QID) | ORAL | Status: AC
  Administered 2014-10-11 – 2014-10-16 (×12): 1000 mg via ORAL
  Filled 2014-10-11 (×20): qty 2

## 2014-10-11 MED ORDER — METHYLPREDNISOLONE SODIUM SUCC 40 MG IJ SOLR
40.0000 mg | Freq: Two times a day (BID) | INTRAMUSCULAR | Status: AC
  Administered 2014-10-11 – 2014-10-12 (×2): 40 mg via INTRAVENOUS
  Filled 2014-10-11 (×2): qty 1

## 2014-10-11 MED ORDER — FENTANYL 10 MCG/ML IV SOLN
INTRAVENOUS | Status: DC
  Administered 2014-10-11: 13:00:00 via INTRAVENOUS
  Filled 2014-10-11: qty 50

## 2014-10-11 MED ORDER — PHENYLEPHRINE HCL 10 MG/ML IJ SOLN
10.0000 mg | INTRAVENOUS | Status: DC | PRN
  Administered 2014-10-11: 20 ug/min via INTRAVENOUS

## 2014-10-11 MED ORDER — DIPHENHYDRAMINE HCL 12.5 MG/5ML PO ELIX
12.5000 mg | ORAL_SOLUTION | Freq: Four times a day (QID) | ORAL | Status: DC | PRN
  Filled 2014-10-11: qty 5

## 2014-10-11 MED ORDER — FAMOTIDINE 40 MG PO TABS
40.0000 mg | ORAL_TABLET | Freq: Two times a day (BID) | ORAL | Status: DC
  Filled 2014-10-11: qty 1

## 2014-10-11 MED ORDER — DEXAMETHASONE SODIUM PHOSPHATE 4 MG/ML IJ SOLN
INTRAMUSCULAR | Status: DC | PRN
  Administered 2014-10-11: 4 mg via INTRAVENOUS

## 2014-10-11 MED ORDER — DEXTROSE-NACL 5-0.45 % IV SOLN
INTRAVENOUS | Status: DC
Start: 2014-10-11 — End: 2014-10-14
  Administered 2014-10-11: 1 mL via INTRAVENOUS
  Administered 2014-10-12: 02:00:00 via INTRAVENOUS

## 2014-10-11 MED ORDER — DIPHENHYDRAMINE HCL 50 MG/ML IJ SOLN: 12.5000 mg | Freq: Four times a day (QID) | INTRAMUSCULAR | Status: DC | PRN

## 2014-10-11 MED ORDER — VANCOMYCIN HCL IN DEXTROSE 1-5 GM/200ML-% IV SOLN
1000.0000 mg | Freq: Two times a day (BID) | INTRAVENOUS | Status: DC
  Administered 2014-10-11 – 2014-10-12 (×2): 1000 mg via INTRAVENOUS
  Filled 2014-10-11 (×3): qty 200

## 2014-10-11 MED ORDER — SUGAMMADEX SODIUM 200 MG/2ML IV SOLN
INTRAVENOUS | Status: DC | PRN
  Administered 2014-10-11: 200 mg via INTRAVENOUS

## 2014-10-11 MED ORDER — PROMETHAZINE HCL 25 MG/ML IJ SOLN: 6.2500 mg | INTRAMUSCULAR | Status: DC | PRN

## 2014-10-11 MED ORDER — FENTANYL 10 MCG/ML IV SOLN
INTRAVENOUS | Status: DC
  Administered 2014-10-11: 130 ug via INTRAVENOUS
  Administered 2014-10-11: 190 ug via INTRAVENOUS
  Administered 2014-10-12: 70 ug via INTRAVENOUS
  Administered 2014-10-12: 140 ug via INTRAVENOUS
  Administered 2014-10-12: 110 ug via INTRAVENOUS
  Administered 2014-10-12: 02:00:00 via INTRAVENOUS
  Administered 2014-10-12: 50 ug via INTRAVENOUS
  Administered 2014-10-12: 130 ug via INTRAVENOUS
  Administered 2014-10-12: 50 ug via INTRAVENOUS
  Filled 2014-10-11: qty 50

## 2014-10-11 MED ORDER — STERILE WATER FOR INJECTION IJ SOLN
INTRAMUSCULAR | Status: AC
  Filled 2014-10-11: qty 10

## 2014-10-11 MED ORDER — SODIUM CHLORIDE 0.9 % IV SOLN
Freq: Once | INTRAVENOUS | Status: AC
  Administered 2014-10-11: 17:00:00 via INTRAVENOUS

## 2014-10-11 MED ORDER — PHENYLEPHRINE 40 MCG/ML (10ML) SYRINGE FOR IV PUSH (FOR BLOOD PRESSURE SUPPORT)
PREFILLED_SYRINGE | INTRAVENOUS | Status: AC
  Filled 2014-10-11: qty 20

## 2014-10-11 MED ORDER — DEXTROSE 5 % IV SOLN
1.5000 g | INTRAVENOUS | Status: AC
  Administered 2014-10-11: 1.5 g via INTRAVENOUS
  Filled 2014-10-11: qty 1.5

## 2014-10-11 MED ORDER — ROCURONIUM BROMIDE 50 MG/5ML IV SOLN
INTRAVENOUS | Status: AC
  Filled 2014-10-11: qty 1

## 2014-10-11 MED ORDER — ETOMIDATE 2 MG/ML IV SOLN
INTRAVENOUS | Status: AC
  Filled 2014-10-11: qty 10

## 2014-10-11 MED ORDER — MIDAZOLAM HCL 2 MG/2ML IJ SOLN
INTRAMUSCULAR | Status: AC
  Filled 2014-10-11: qty 4

## 2014-10-11 MED ORDER — POTASSIUM CHLORIDE 10 MEQ/50ML IV SOLN
10.0000 meq | Freq: Every day | INTRAVENOUS | Status: DC | PRN
  Administered 2014-10-12 – 2014-10-16 (×9): 10 meq via INTRAVENOUS
  Filled 2014-10-11 (×12): qty 50

## 2014-10-11 MED ORDER — SUCCINYLCHOLINE CHLORIDE 20 MG/ML IJ SOLN
INTRAMUSCULAR | Status: AC
  Filled 2014-10-11: qty 1

## 2014-10-11 MED ORDER — FUROSEMIDE 10 MG/ML IJ SOLN
40.0000 mg | Freq: Once | INTRAMUSCULAR | Status: AC
  Administered 2014-10-11: 40 mg via INTRAVENOUS
  Filled 2014-10-11: qty 4

## 2014-10-11 MED ORDER — LEVALBUTEROL HCL 1.25 MG/0.5ML IN NEBU
1.2500 mg | INHALATION_SOLUTION | Freq: Four times a day (QID) | RESPIRATORY_TRACT | Status: DC
  Administered 2014-10-12 (×4): 1.25 mg via RESPIRATORY_TRACT
  Filled 2014-10-11 (×6): qty 0.5

## 2014-10-11 MED ORDER — ACETAMINOPHEN 160 MG/5ML PO SOLN: 1000.0000 mg | Freq: Four times a day (QID) | ORAL | Status: AC

## 2014-10-11 MED ORDER — ONDANSETRON HCL 4 MG/2ML IJ SOLN: 4.0000 mg | Freq: Four times a day (QID) | INTRAMUSCULAR | Status: DC | PRN

## 2014-10-11 MED ORDER — LEVALBUTEROL HCL 1.25 MG/0.5ML IN NEBU
1.2500 mg | INHALATION_SOLUTION | Freq: Four times a day (QID) | RESPIRATORY_TRACT | Status: DC
  Administered 2014-10-11: 1.25 mg via RESPIRATORY_TRACT
  Filled 2014-10-11 (×3): qty 0.5

## 2014-10-11 MED ORDER — TRAMADOL HCL 50 MG PO TABS
50.0000 mg | ORAL_TABLET | Freq: Four times a day (QID) | ORAL | Status: DC | PRN
  Administered 2014-10-11: 50 mg via ORAL
  Administered 2014-10-12: 100 mg via ORAL
  Administered 2014-10-12: 50 mg via ORAL
  Administered 2014-10-13: 100 mg via ORAL
  Administered 2014-10-13: 50 mg via ORAL
  Administered 2014-10-13 – 2014-10-16 (×3): 100 mg via ORAL
  Filled 2014-10-11 (×5): qty 2
  Filled 2014-10-11 (×2): qty 1
  Filled 2014-10-11: qty 2
  Filled 2014-10-11: qty 1

## 2014-10-11 MED ORDER — METOCLOPRAMIDE HCL 5 MG/ML IJ SOLN
10.0000 mg | Freq: Four times a day (QID) | INTRAMUSCULAR | Status: AC
  Administered 2014-10-11 – 2014-10-12 (×3): 10 mg via INTRAVENOUS
  Filled 2014-10-11 (×2): qty 2

## 2014-10-11 MED ORDER — SUCCINYLCHOLINE CHLORIDE 20 MG/ML IJ SOLN
INTRAMUSCULAR | Status: DC | PRN
  Administered 2014-10-11: 100 mg via INTRAVENOUS

## 2014-10-11 MED ORDER — MEPERIDINE HCL 25 MG/ML IJ SOLN: 6.2500 mg | INTRAMUSCULAR | Status: DC | PRN

## 2014-10-11 MED ORDER — TALC 5 G PL SUSR
INTRAPLEURAL | Status: DC | PRN
  Administered 2014-10-11: 5 g via INTRAPLEURAL

## 2014-10-11 MED ORDER — SUGAMMADEX SODIUM 200 MG/2ML IV SOLN
INTRAVENOUS | Status: AC
  Filled 2014-10-11: qty 2

## 2014-10-11 MED ORDER — 0.9 % SODIUM CHLORIDE (POUR BTL) OPTIME
TOPICAL | Status: DC | PRN
  Administered 2014-10-11: 3000 mL

## 2014-10-11 MED ORDER — DEXTROSE 5 % IV SOLN
1.5000 g | Freq: Two times a day (BID) | INTRAVENOUS | Status: AC
  Administered 2014-10-11 – 2014-10-14 (×6): 1.5 g via INTRAVENOUS
  Filled 2014-10-11 (×6): qty 1.5

## 2014-10-11 MED ORDER — NALOXONE HCL 0.4 MG/ML IJ SOLN: 0.4000 mg | INTRAMUSCULAR | Status: DC | PRN

## 2014-10-11 MED ORDER — HYDROMORPHONE HCL 1 MG/ML IJ SOLN
INTRAMUSCULAR | Status: AC
  Administered 2014-10-11: 0.5 mg via INTRAVENOUS
  Filled 2014-10-11: qty 2

## 2014-10-11 MED ORDER — VANCOMYCIN HCL 1000 MG IV SOLR
1000.0000 mg | INTRAVENOUS | Status: AC
  Administered 2014-10-11: 1000 mg via INTRAVENOUS
  Filled 2014-10-11: qty 1000

## 2014-10-11 SURGICAL SUPPLY — 75 items
ADH SKN CLS APL DERMABOND .7 (GAUZE/BANDAGES/DRESSINGS) ×1
BAG DECANTER FOR FLEXI CONT (MISCELLANEOUS) IMPLANT
BLADE SURG 11 STRL SS (BLADE) ×3 IMPLANT
CANISTER SUCTION 2500CC (MISCELLANEOUS) ×3 IMPLANT
CATH KIT ON Q 5IN SLV (PAIN MANAGEMENT) IMPLANT
CATH ROBINSON RED A/P 18FR (CATHETERS) ×2 IMPLANT
CATH ROBINSON RED A/P 22FR (CATHETERS) ×4 IMPLANT
CATH THORACIC 28FR (CATHETERS) ×2 IMPLANT
CATH THORACIC 36FR (CATHETERS) IMPLANT
CATH THORACIC 36FR RT ANG (CATHETERS) ×2 IMPLANT
CLEANER TIP ELECTROSURG 2X2 (MISCELLANEOUS) ×2 IMPLANT
CONN ST 1/4X3/8  BEN (MISCELLANEOUS) ×2
CONN ST 1/4X3/8 BEN (MISCELLANEOUS) IMPLANT
CONT SPEC 4OZ CLIKSEAL STRL BL (MISCELLANEOUS) ×8 IMPLANT
COVER SURGICAL LIGHT HANDLE (MISCELLANEOUS) ×6 IMPLANT
DERMABOND ADVANCED (GAUZE/BANDAGES/DRESSINGS) ×2
DERMABOND ADVANCED .7 DNX12 (GAUZE/BANDAGES/DRESSINGS) IMPLANT
DRAPE LAPAROSCOPIC ABDOMINAL (DRAPES) ×3 IMPLANT
DRAPE WARM FLUID 44X44 (DRAPE) ×3 IMPLANT
ELECT REM PT RETURN 9FT ADLT (ELECTROSURGICAL) ×3
ELECTRODE REM PT RTRN 9FT ADLT (ELECTROSURGICAL) ×1 IMPLANT
GAUZE SPONGE 4X4 12PLY STRL (GAUZE/BANDAGES/DRESSINGS) ×3 IMPLANT
GAUZE XEROFORM 1X8 LF (GAUZE/BANDAGES/DRESSINGS) ×2 IMPLANT
GLOVE BIO SURGEON STRL SZ7.5 (GLOVE) ×6 IMPLANT
GOWN STRL REUS W/ TWL LRG LVL3 (GOWN DISPOSABLE) ×3 IMPLANT
GOWN STRL REUS W/TWL LRG LVL3 (GOWN DISPOSABLE) ×9
HANDLE STAPLE ENDO GIA SHORT (STAPLE) ×2
KIT BASIN OR (CUSTOM PROCEDURE TRAY) ×3 IMPLANT
KIT ROOM TURNOVER OR (KITS) ×3 IMPLANT
KIT SUCTION CATH 14FR (SUCTIONS) ×3 IMPLANT
NS IRRIG 1000ML POUR BTL (IV SOLUTION) ×6 IMPLANT
PACK CHEST (CUSTOM PROCEDURE TRAY) ×3 IMPLANT
PAD ARMBOARD 7.5X6 YLW CONV (MISCELLANEOUS) ×6 IMPLANT
RELOAD EGIA 45 MED/THCK PURPLE (STAPLE) ×12 IMPLANT
RELOAD EGIA 60 MED/THCK PURPLE (STAPLE) ×3 IMPLANT
RELOAD STAPLE 60 MED/THCK ART (STAPLE) IMPLANT
SCISSORS LAP 5X35 DISP (ENDOMECHANICALS) ×2 IMPLANT
SEALANT SURG COSEAL 4ML (VASCULAR PRODUCTS) IMPLANT
SEALANT SURG COSEAL 8ML (VASCULAR PRODUCTS) ×2 IMPLANT
SOLUTION ANTI FOG 6CC (MISCELLANEOUS) ×3 IMPLANT
SPONGE GAUZE 4X4 12PLY STER LF (GAUZE/BANDAGES/DRESSINGS) ×2 IMPLANT
SPONGE TONSIL 1 RF SGL (DISPOSABLE) ×3 IMPLANT
STAPLER ENDO GIA 12 SHRT THIN (STAPLE) IMPLANT
STAPLER ENDO GIA 12MM SHORT (STAPLE) ×1 IMPLANT
SUT CHROMIC 3 0 SH 27 (SUTURE) IMPLANT
SUT ETHILON 3 0 PS 1 (SUTURE) ×2 IMPLANT
SUT PROLENE 3 0 SH DA (SUTURE) IMPLANT
SUT PROLENE 4 0 RB 1 (SUTURE)
SUT PROLENE 4-0 RB1 .5 CRCL 36 (SUTURE) IMPLANT
SUT SILK  1 MH (SUTURE) ×4
SUT SILK 1 MH (SUTURE) ×2 IMPLANT
SUT SILK 2 0SH CR/8 30 (SUTURE) ×2 IMPLANT
SUT SILK 3 0SH CR/8 30 (SUTURE) IMPLANT
SUT VIC AB 1 CTX 18 (SUTURE) IMPLANT
SUT VIC AB 2 TP1 27 (SUTURE) IMPLANT
SUT VIC AB 2-0 CT1 27 (SUTURE) ×9
SUT VIC AB 2-0 CT1 TAPERPNT 27 (SUTURE) IMPLANT
SUT VIC AB 2-0 CT2 18 VCP726D (SUTURE) IMPLANT
SUT VIC AB 2-0 CTX 36 (SUTURE) IMPLANT
SUT VIC AB 3-0 SH 18 (SUTURE) IMPLANT
SUT VIC AB 3-0 X1 27 (SUTURE) ×4 IMPLANT
SUT VICRYL 0 UR6 27IN ABS (SUTURE) ×6 IMPLANT
SUT VICRYL 2 TP 1 (SUTURE) IMPLANT
SWAB COLLECTION DEVICE MRSA (MISCELLANEOUS) IMPLANT
SYR BULB IRRIGATION 50ML (SYRINGE) ×2 IMPLANT
SYSTEM SAHARA CHEST DRAIN ATS (WOUND CARE) ×3 IMPLANT
TIP APPLICATOR SPRAY EXTEND 16 (VASCULAR PRODUCTS) IMPLANT
TOWEL OR 17X24 6PK STRL BLUE (TOWEL DISPOSABLE) ×3 IMPLANT
TOWEL OR 17X26 10 PK STRL BLUE (TOWEL DISPOSABLE) ×6 IMPLANT
TRAP SPECIMEN MUCOUS 40CC (MISCELLANEOUS) IMPLANT
TRAY FOLEY CATH 16FRSI W/METER (SET/KITS/TRAYS/PACK) ×3 IMPLANT
TUBE ANAEROBIC SPECIMEN COL (MISCELLANEOUS) IMPLANT
TUBE CONNECTING 12'X1/4 (SUCTIONS) ×1
TUBE CONNECTING 12X1/4 (SUCTIONS) ×1 IMPLANT
WATER STERILE IRR 1000ML POUR (IV SOLUTION) ×6 IMPLANT

## 2014-10-11 NOTE — Progress Notes (Signed)
CT surgery p.m. Rounds  Patient breathing comfortably Pain controlled with PCA Chest tube output has significantly reduced Breath sounds are clear bilaterally with scant wheezing No air leak from chest tubes Hemoglobin stable this p.m.

## 2014-10-11 NOTE — Anesthesia Preprocedure Evaluation (Addendum)
Anesthesia Evaluation  Patient identified by MRN, date of birth, ID band Patient awake    Reviewed: Allergy & Precautions, H&P , NPO status , Patient's Chart, lab work & pertinent test results, reviewed documented beta blocker date and time   History of Anesthesia Complications (+) PONV and history of anesthetic complications  Airway Mallampati: III  TM Distance: >3 FB Neck ROM: Full    Dental  (+) Dental Advisory Given, Edentulous Upper   Pulmonary pneumonia, COPD, former smoker,    breath sounds clear to auscultation       Cardiovascular hypertension, + CAD, + Past MI and + Peripheral Vascular Disease (s/p EVAR for AAA)  + dysrhythmias  Rhythm:Regular Rate:Normal     Neuro/Psych    GI/Hepatic Neg liver ROS, GERD  ,  Endo/Other  negative endocrine ROS  Renal/GU negative Renal ROS     Musculoskeletal  (+) Arthritis ,   Abdominal   Peds  Hematology  (+) anemia ,   Anesthesia Other Findings 2010 TTE  1. Left ventricle: The cavity size was normal. Wall thickness was     increased in a pattern of mild LVH. Systolic function was normal.     The estimated ejection fraction was in the range of 60% to 65%.  2. Mitral valve: Mild regurgitation.  3. Pulmonary arteries: PA peak pressure: 61mm Hg (S).      Reproductive/Obstetrics                            Anesthesia Physical  Anesthesia Plan  ASA: III  Anesthesia Plan: General   Post-op Pain Management:    Induction: Intravenous  Airway Management Planned: Oral ETT and Double Lumen EBT  Additional Equipment: Arterial line  Intra-op Plan:   Post-operative Plan: Extubation in OR  Informed Consent: I have reviewed the patients History and Physical, chart, labs and discussed the procedure including the risks, benefits and alternatives for the proposed anesthesia with the patient or authorized representative who has indicated his/her  understanding and acceptance.   Dental advisory given  Plan Discussed with: CRNA  Anesthesia Plan Comments:         Anesthesia Quick Evaluation

## 2014-10-11 NOTE — Transfer of Care (Signed)
Immediate Anesthesia Transfer of Care Note  Patient: [name]  Procedure(s) Performed: Procedure(s): VIDEO ASSISTED THORACOSCOPY (Left) STAPLING OF BLEBS (Left)  Patient Location: PACU  Anesthesia Type:General  Level of Consciousness: awake and alert   Airway & Oxygen Therapy: Patient Spontanous Breathing and Patient connected to face mask oxygen  Post-op Assessment: Report given to RN and Post -op Vital signs reviewed and stable  Post vital signs: Reviewed and stable  Last Vitals:  Filed Vitals:   10/11/14 1108  BP: 108/69  Pulse: 92  Temp: 36.8 C  Resp: 8    Complications: No apparent anesthesia complications

## 2014-10-11 NOTE — Progress Notes (Signed)
[name] is on his way to the operating room. He is going to go have the left lung treated so that this pneumothorax will not recur.  He did get platelets and blood yesterday. His platelet count 55,000. His hemoglobin is 7.7.  I did start him on some Neupogen. His white count is 3.1.  He looks pretty good. He sounds pretty good.  On his physical exam, his blood pressures down a little bit. Is 89/55. His temperature is 97.3. Pulse is 64. His lungs show good breath sounds bilaterally. Cardiac exam regular rate and rhythm with no murmurs, rubs or bruits. Abdomen is soft. He has no palpable liver or spleen tip. Extremities shows no clubbing, cyanosis or edema.  We will await the results of his surgery. Hope, he will not have a recurrence of this pneumothorax. Hopefully, he'll be able to go home soon.  He is on Desferal.  We will follow his iron studies along.  Of note, his last iron saturation was only 46% about 10 days ago. I think this is quite good. I think the elevated ferritin is more so an acute phase reactant.  I appreciate all the great care that he is getting in the cardiothoracic ICU. All the nurses are doing a fantastic job. He and his wife are very appreciative of their compassion!!  [name]  1 Peter 4:10

## 2014-10-11 NOTE — Anesthesia Procedure Notes (Signed)
Procedure Name: Intubation Date/Time: 10/11/2014 7:45 AM Performed by: Ollen Bowl Pre-anesthesia Checklist: Patient identified, Emergency Drugs available, Suction available, Patient being monitored and Timeout performed Patient Re-evaluated:Patient Re-evaluated prior to inductionOxygen Delivery Method: Circle system utilized and Simple face mask Preoxygenation: Pre-oxygenation with 100% oxygen Intubation Type: IV induction Ventilation: Mask ventilation without difficulty Laryngoscope Size: Miller and 3 Grade View: Grade I Endobronchial tube: Left, Double lumen EBT, EBT position confirmed by auscultation and EBT position confirmed by fiberoptic bronchoscope and 39 Fr Number of attempts: 1 Airway Equipment and Method: Patient positioned with wedge pillow and Stylet Placement Confirmation: ETT inserted through vocal cords under direct vision,  positive ETCO2 and breath sounds checked- equal and bilateral Tube secured with: Tape Dental Injury: Teeth and Oropharynx as per pre-operative assessment

## 2014-10-11 NOTE — Brief Op Note (Signed)
10/09/2014 - 10/11/2014  10:31 AM  PATIENT:  [name]  69 y.o. male  PRE-OPERATIVE DIAGNOSIS:  LEFT PTX WITH AIRLEAK  POST-OPERATIVE DIAGNOSIS:  LEFT PTX WITH AIRLEAK  PROCEDURE:  Procedure(s):  VIDEO ASSISTED THORACOSCOPY (Left) - Stapling of Blebs LUL x 3 - Talc Pleurodesis  SURGEON:  Surgeon(s) and Role:    * Ivin Poot, MD - Primary  PHYSICIAN ASSISTANT: Ellwood Handler PA-C  ANESTHESIA:   general  EBL:  Total I/O In: 2018 [I.V.:1100; Blood:918] Out: 450 [Urine:250; Blood:200]  BLOOD ADMINISTERED:1 Unit PRBC and 1 pack PLTS  DRAINS: 36 Angled, 28 straight chest tube left chest   LOCAL MEDICATIONS USED:  NONE  SPECIMEN:  Source of Specimen:  LUL Wedge x 3  DISPOSITION OF SPECIMEN:  PATHOLOGY  COUNTS:  YES  TOURNIQUET:  * No tourniquets in log *  DICTATION: .Dragon Dictation  PLAN OF CARE: Admit to inpatient   PATIENT DISPOSITION:  ICU - extubated and stable.   Delay start of Pharmacological VTE agent (>24hrs) due to surgical blood loss or risk of bleeding: yes

## 2014-10-11 NOTE — Progress Notes (Signed)
Platelet with unit [id number] given. Temp. 97.7-temporal, P-96, R-12, BP 118/71.

## 2014-10-11 NOTE — Anesthesia Postprocedure Evaluation (Signed)
Anesthesia Post Note  Patient: [name]  Procedure(s) Performed: Procedure(s) (LRB): VIDEO ASSISTED THORACOSCOPY (Left) STAPLING OF BLEBS (Left)  Anesthesia type: General  Patient location: PACU  Post pain: Pain level controlled  Post assessment: Post-op Vital signs reviewed  Last Vitals: BP 105/70 mmHg  Pulse 88  Temp(Src) 36.8 C (Oral)  Resp 13  Ht 5\' 10"  (1.778 m)  Wt 181 lb (82.1 kg)  BMI 25.97 kg/m2  SpO2 100%  Post vital signs: Reviewed  Level of consciousness: sedated  Complications: No apparent anesthesia complications

## 2014-10-11 NOTE — Progress Notes (Signed)
The patient was examined and preop studies reviewed. There has been no change from the prior exam and the patient is ready for surgery.  plan L VATS and resection of blebs on N Seted

## 2014-10-11 NOTE — Progress Notes (Signed)
Dr Darcey Nora updated on pt status

## 2014-10-12 ENCOUNTER — Inpatient Hospital Stay (HOSPITAL_COMMUNITY): Payer: Medicare Other

## 2014-10-12 ENCOUNTER — Encounter (HOSPITAL_COMMUNITY): Payer: Self-pay | Admitting: Cardiothoracic Surgery

## 2014-10-12 ENCOUNTER — Other Ambulatory Visit: Payer: Self-pay | Admitting: *Deleted

## 2014-10-12 ENCOUNTER — Encounter: Payer: Self-pay | Admitting: Hematology & Oncology

## 2014-10-12 ENCOUNTER — Ambulatory Visit: Payer: Medicare Other

## 2014-10-12 DIAGNOSIS — D46Z Other myelodysplastic syndromes: Secondary | ICD-10-CM

## 2014-10-12 LAB — POCT I-STAT, CHEM 8
BUN: 17 mg/dL (ref 6–20)
Calcium, Ion: 1 mmol/L — ABNORMAL LOW (ref 1.13–1.30)
Chloride: 96 mmol/L — ABNORMAL LOW (ref 101–111)
Creatinine, Ser: 1.3 mg/dL — ABNORMAL HIGH (ref 0.61–1.24)
Glucose, Bld: 215 mg/dL — ABNORMAL HIGH (ref 65–99)
HCT: 26 % — ABNORMAL LOW (ref 39.0–52.0)
Hemoglobin: 8.8 g/dL — ABNORMAL LOW (ref 13.0–17.0)
Potassium: 3.6 mmol/L (ref 3.5–5.1)
Sodium: 136 mmol/L (ref 135–145)
TCO2: 22 mmol/L (ref 0–100)

## 2014-10-12 LAB — BLOOD GAS, ARTERIAL
ACID-BASE DEFICIT: 2.8 mmol/L — AB (ref 0.0–2.0)
Bicarbonate: 21.2 mEq/L (ref 20.0–24.0)
DRAWN BY: 437071
FIO2: 0.28
O2 CONTENT: 2 L/min
O2 Saturation: 97.3 %
Patient temperature: 98.2
TCO2: 22.3 mmol/L (ref 0–100)
pCO2 arterial: 34.1 mmHg — ABNORMAL LOW (ref 35.0–45.0)
pH, Arterial: 7.409 (ref 7.350–7.450)
pO2, Arterial: 86.6 mmHg (ref 80.0–100.0)

## 2014-10-12 LAB — POCT I-STAT 7, (LYTES, BLD GAS, ICA,H+H)
Acid-base deficit: 4 mmol/L — ABNORMAL HIGH (ref 0.0–2.0)
Bicarbonate: 22.4 mEq/L (ref 20.0–24.0)
Calcium, Ion: 1.16 mmol/L (ref 1.13–1.30)
HCT: 26 % — ABNORMAL LOW (ref 39.0–52.0)
Hemoglobin: 8.8 g/dL — ABNORMAL LOW (ref 13.0–17.0)
O2 Saturation: 99 %
Patient temperature: 35.4
Potassium: 4.2 mmol/L (ref 3.5–5.1)
Sodium: 139 mmol/L (ref 135–145)
TCO2: 24 mmol/L (ref 0–100)
pCO2 arterial: 44.7 mmHg (ref 35.0–45.0)
pH, Arterial: 7.3 — ABNORMAL LOW (ref 7.350–7.450)
pO2, Arterial: 151 mmHg — ABNORMAL HIGH (ref 80.0–100.0)

## 2014-10-12 LAB — BASIC METABOLIC PANEL
ANION GAP: 9 (ref 5–15)
BUN: 16 mg/dL (ref 6–20)
CALCIUM: 7.2 mg/dL — AB (ref 8.9–10.3)
CHLORIDE: 99 mmol/L — AB (ref 101–111)
CO2: 23 mmol/L (ref 22–32)
CREATININE: 1.39 mg/dL — AB (ref 0.61–1.24)
GFR calc Af Amer: 59 mL/min — ABNORMAL LOW (ref >60)
GFR calc non Af Amer: 51 mL/min — ABNORMAL LOW (ref >60)
GLUCOSE: 147 mg/dL — AB (ref 65–99)
POTASSIUM: 3.3 mmol/L — AB (ref 3.5–5.1)
SODIUM: 131 mmol/L — AB (ref 135–145)

## 2014-10-12 LAB — PREPARE PLATELET PHERESIS
UNIT DIVISION: 0
UNIT DIVISION: 0
Unit division: 0
Unit division: 0

## 2014-10-12 LAB — PREPARE FRESH FROZEN PLASMA
Unit division: 0
Unit division: 0

## 2014-10-12 LAB — URINE CULTURE

## 2014-10-12 LAB — CBC
HCT: 21.2 % — ABNORMAL LOW (ref 39.0–52.0)
HEMOGLOBIN: 7.2 g/dL — AB (ref 13.0–17.0)
MCH: 27.9 pg (ref 26.0–34.0)
MCHC: 34 g/dL (ref 30.0–36.0)
MCV: 82.2 fL (ref 78.0–100.0)
Platelets: 71 10*3/uL — ABNORMAL LOW (ref 150–400)
RBC: 2.58 MIL/uL — AB (ref 4.22–5.81)
RDW: 15.5 % (ref 11.5–15.5)
WBC: 2.2 10*3/uL — ABNORMAL LOW (ref 4.0–10.5)

## 2014-10-12 MED ORDER — SIMETHICONE 80 MG PO CHEW
80.0000 mg | CHEWABLE_TABLET | Freq: Once | ORAL | Status: AC
  Administered 2014-10-12: 80 mg via ORAL
  Filled 2014-10-12: qty 1

## 2014-10-12 MED ORDER — FUROSEMIDE 10 MG/ML IJ SOLN
40.0000 mg | Freq: Two times a day (BID) | INTRAMUSCULAR | Status: DC
  Administered 2014-10-12 – 2014-10-13 (×3): 40 mg via INTRAVENOUS
  Filled 2014-10-12 (×5): qty 4

## 2014-10-12 MED ORDER — LIDOCAINE-PRILOCAINE 2.5-2.5 % EX CREA: 1.0000 "application " | TOPICAL_CREAM | CUTANEOUS | Status: AC | PRN

## 2014-10-12 MED ORDER — POTASSIUM CHLORIDE 10 MEQ/50ML IV SOLN
10.0000 meq | INTRAVENOUS | Status: AC | PRN
  Administered 2014-10-12 (×3): 10 meq via INTRAVENOUS
  Filled 2014-10-12: qty 50

## 2014-10-12 MED ORDER — VANCOMYCIN HCL IN DEXTROSE 750-5 MG/150ML-% IV SOLN
750.0000 mg | Freq: Two times a day (BID) | INTRAVENOUS | Status: AC
  Administered 2014-10-12 – 2014-10-14 (×4): 750 mg via INTRAVENOUS
  Filled 2014-10-12 (×4): qty 150

## 2014-10-12 MED ORDER — FILGRASTIM 480 MCG/1.6ML IJ SOLN: 480.0000 ug | Freq: Once | INTRAMUSCULAR | Status: DC

## 2014-10-12 NOTE — Progress Notes (Signed)
[name] is pretty sore this morning. He underwent surgery yesterday for removal of the left apical blebs. Dr. Prescott Gum did a great job. There was not a lot of bleeding. There was some oozing that was noted. He did get some blood and platelets before the procedure.  His labs today show his white cell count of 2.2. His hemoglobin is 7.2. Platelet count is 71,000. I would go ahead and give him some Neupogen. I think that this will help.  He has had no fever.  He still has the chest tubes in.  His physical exam is pretty much unchanged. His vital signs are all stable.  his blood pressure is 97/49.Marland Kitchen  For now, we'll just try to manage his blood counts. I would avoid transfusing him unless he is actively bleeding or his platelet count gets below 20,000. He is at significant risk for alloimmunization with blood products. I want to try to avoid this as much as possible.  He is on Desferal. I would keep him on this for right now.  [name] E.  2 Timothy 1:7

## 2014-10-12 NOTE — Progress Notes (Signed)
Vancomycin dosing changed to 750mg  IV q12h d/t change in renal function.  SCr 1.39 with est CrCl ~50-45mL/min.  End date remains the same as originally ordered- last dose due 9/30 at 0230 for 72h of prophylaxis as per discussion with Ellwood Handler, PA yesterday.   Lauren D. Bajbus, PharmD, BCPS Clinical Pharmacist Pager: [phone] 10/12/2014 10:40 AM

## 2014-10-12 NOTE — Clinical Documentation Improvement (Signed)
Cardiothoracic  Per H + P antibiotics started for diagnosis of "probable Bronchopneumonia" please document  if condition was ruled in or out ? Thank you   Bronchopneumonia ruled in   Bronchopneumonia ruled out/resolved  Other  Clinically Undetermined    Please exercise your independent, professional judgment when responding. A specific answer is not anticipated or expected.   Thank You, [place] [phone]

## 2014-10-12 NOTE — Care Management Note (Signed)
Case Management Note  Patient Details  Name: [name] MRN: [id number] Date of Birth: [birth year]  Subjective/Objective:    Talked with Patient and his wife, [name].  Patient has an upset stomach at this time and gave me permission to talk mostly to his wife and he did not feel well.  [name] states he lives at home with her and has been very independent, even still drives himself to chemo treatments.  Plan is to return back home with wife.                  Action/Plan:   Expected Discharge Date:                  Expected Discharge Plan:  Home/Self Care  In-House Referral:     Discharge planning Services  CM Consult  Post Acute Care Choice:    Choice offered to:     DME Arranged:    DME Agency:     HH Arranged:    HH Agency:     Status of Service:  In process, will continue to follow  Medicare Important Message Given:  Yes-second notification given Date Medicare IM Given:    Medicare IM give by:    Date Additional Medicare IM Given:    Additional Medicare Important Message give by:     If discussed at [place] of Stay Meetings, dates discussed:    Additional Comments:  Vergie Living, RN 10/12/2014, 1:42 PM

## 2014-10-12 NOTE — Op Note (Signed)
NAMEMarland [name]  COLTYN, HANNING NO.:  [id number]  MEDICAL RECORD NO.:  [id number]  LOCATION:  2S11C                        FACILITY:  [facility]  PHYSICIAN:  Ivin Poot, M.D.  DATE OF BIRTH:  Dec 31, 1945  DATE OF PROCEDURE:  10/11/2014 DATE OF DISCHARGE:                              OPERATIVE REPORT   OPERATION: 1. Left VATS, resection of apical blebs. 2. Talc pleurodesis.  SURGEON:  Ivin Poot, M.D.  ASSISTANT:  Ellwood Handler, PA-C.  PREOPERATIVE DIAGNOSIS: 1. Spontaneous pneumothorax, recurrent, with chronic obstructive     pulmonary disease and apical blebs, documented on CT scan. 2. Myelodysplasia with severe blood dyscrasia, anemia,     thrombocytopenia, and leukopenia.  POSTOPERATIVE DIAGNOSIS: 1. Spontaneous pneumothorax, recurrent, with chronic obstructive     pulmonary disease and apical blebs, documented on CT scan. 2. Myelodysplasia with severe blood dyscrasia, anemia,     thrombocytopenia, and leukopenia.  ANESTHESIA:  General.  CLINICAL NOTE:  The patient is a 69 year old gentleman, who was admitted earlier this month with a spontaneous pneumothorax which was treated with a chest tube and the pneumothorax resolved, and he was discharged. Within 2 weeks, he returned to the ED with recurrent symptoms of shortness of breath and chest pain and was found to have a large 80-90% spontaneous left pneumothorax.  A chest tube was again placed.  The patient was admitted.  Hematologist saw the patient and recommended transfusion therapy and recommended for surgical correction of his apical bleb disease and spontaneous pneumothorax during this hospitalization.  The patient was prepared with preoperative transfusion therapy of blood packed cells and platelets.  I discussed the procedure of left VATS with resection of apical blebs and talc pleurodesis with the patient and his family.  I discussed the indications, benefits, alternatives, and risks.  He  understood that the incision would be performed under general anesthesia with small thoracoscopic incisions, but that there is risk for bleeding due to his underlying hematologic disease and that he would most likely need blood transfusions.  I discussed the other potential complications including infection, ventilator dependence, recurrent pneumothorax, prolonged air leak, and death.  After reviewing these issues, he demonstrated his understanding and agreed to proceed with surgery under what I felt was an informed consent.  OPERATIVE PROCEDURE:  The patient was brought to the operating room and placed supine on the operating table, where general anesthesia was induced under invasive hemodynamic monitoring.  The patient was turned left side up.  The patient had been previously marked on the proper side.  The left chest was prepped and draped as a sterile field.  A proper time-out was performed.  Three small VATS incisions were made around the left chest below the apex of the scapula in the eighth interspace to the fifth interspace in the anterior axillary line anteriorly and to the posterior border of the scapula posteriorly.  The VATS camera was inserted.  The lung had severe changes of chronic smoking.  There apical adhesions which obscured the bullous bleb disease at the apex.  Through the VATS portals and using the VATS instruments with suction and cautery, the adhesions were taken down and the upper lobe was  immobilized for closer inspection.  The blebs were identified. Using the Endo GIA stapling device, the blebs were removed with 3 wedge resections.  The wedge resections were coated with thin layer of medical adhesive-Coseal.  The pleural surface from the takedown of the adhesions was with diffuse oozing and this was treated with electrocautery to obtain adequate hemostasis.  I placed 2 chest tubes, one dependently above the diaphragm and the other one towards the apex  anteriorly.  We then injected 5 g of talc insufflated into the left pleural space, observe under the VATS camera.  There was good coating of the entire left hemithorax with the talc particles.  The left lung was then reinflated under direct vision.  The incisions were closed in layers using Vicryl.  The chest tubes were connected to an underwater seal Pleur-Evac drainage system.  The patient was then turned supine, and a chest x-ray was obtained in the operating, which I examined and saw that there was no pneumothorax.  The patient was extubated and returned to the recovery room in stable condition without an active air leak through the chest tube Pleur-Evac system.     Ivin Poot, M.D.     PV/MEDQ  D:  10/11/2014  T:  10/12/2014  Job:  [id number]  cc:   Volanda Napoleon, M.D.

## 2014-10-12 NOTE — Progress Notes (Signed)
Patient ID: [name], male   DOB: [birth year], 69 y.o.   MRN: [id number]  SICU Evening Rounds:  Hemodynamically stable  Transfused 2 units today.  Diuresing well  Up in chair but has not walked today. Says he is weak.   BMET    Component Value Date/Time   NA 136 10/12/2014 1501   NA 135 10/03/2014 0855   NA 136 09/12/2014 1325   K 3.6 10/12/2014 1501   K 3.6 10/03/2014 0855   K 3.7 09/12/2014 1325   CL 96* 10/12/2014 1501   CL 102 10/03/2014 0855   CO2 23 10/12/2014 0405   CO2 25 10/03/2014 0855   CO2 21* 09/12/2014 1325   GLUCOSE 215* 10/12/2014 1501   GLUCOSE 115 10/03/2014 0855   GLUCOSE 181* 09/12/2014 1325   BUN 17 10/12/2014 1501   BUN 16 10/03/2014 0855   BUN 28.0* 09/12/2014 1325   CREATININE 1.30* 10/12/2014 1501   CREATININE 1.2 10/03/2014 0855   CREATININE 1.4* 09/12/2014 1325   CALCIUM 7.2* 10/12/2014 0405   CALCIUM 9.3 10/03/2014 0855   CALCIUM 8.9 09/12/2014 1325   GFRNONAA 51* 10/12/2014 0405   GFRAA 59* 10/12/2014 0405    CBC    Component Value Date/Time   WBC 2.2* 10/12/2014 0405   WBC 2.0* 10/03/2014 0855   RBC 2.58* 10/12/2014 0405   RBC 2.25* 10/03/2014 0855   RBC 2.26* 10/03/2014 0854   HGB 8.8* 10/12/2014 1501   HGB 6.5* 10/03/2014 0855   HCT 26.0* 10/12/2014 1501   HCT 20.1* 10/03/2014 0855   PLT 71* 10/12/2014 0405   PLT 37* 10/03/2014 0855   MCV 82.2 10/12/2014 0405   MCV 89 10/03/2014 0855   MCH 27.9 10/12/2014 0405   MCH 28.9 10/03/2014 0855   MCHC 34.0 10/12/2014 0405   MCHC 32.3 10/03/2014 0855   RDW 15.5 10/12/2014 0405   RDW 13.6 10/03/2014 0855   LYMPHSABS 0.5* 10/09/2014 1958   MONOABS 0.1 10/09/2014 1958   EOSABS 0.0 10/09/2014 1958   BASOSABS 0.0 10/09/2014 1958    A/P: He is stable but needs to mobilize.

## 2014-10-12 NOTE — Care Management Important Message (Signed)
Important Message  Patient Details  Name: [name] MRN: [id number] Date of Birth: 24-Jul-1945   Medicare Important Message Given:  Yes-second notification given    Nathen May 10/12/2014, 11:45 AM

## 2014-10-12 NOTE — Progress Notes (Signed)
TCTS DAILY ICU PROGRESS NOTE                   [address]            [place] [address]          [phone]   1 Day Post-Op Procedure(s) (LRB): VIDEO ASSISTED THORACOSCOPY (Left) STAPLING OF BLEBS (Left)  Total Length of Stay:  LOS: 3 days   Subjective: OOB in chair.  Breathing comfortably, having some chest soreness which is relieved by PCA.  No nausea.   Objective: Vital signs in last 24 hours: Temp:  [97.7 F (36.5 C)-99.4 F (37.4 C)] 98.2 F (36.8 C) (09/28 0400) Pulse Rate:  [69-103] 77 (09/28 0700) Cardiac Rhythm:  [-] Normal sinus rhythm (09/28 0400) Resp:  [7-26] 9 (09/28 0700) BP: (87-122)/(51-81) 96/65 mmHg (09/28 0700) SpO2:  [93 %-100 %] 97 % (09/28 0736) Arterial Line BP: (97-211)/(49-137) 97/49 mmHg (09/28 0700)  Filed Weights   10/09/14 2321  Weight: 181 lb (82.1 kg)    Weight change:    Hemodynamic parameters for last 24 hours:    Intake/Output from previous day: 09/27 0701 - 09/28 0700 In: 7238 [P.O.:120; I.V.:3500; Blood:1518; IV Piggyback:2100] Out: 5176 [Urine:2015; Blood:200; Chest Tube:630]  Intake/Output this shift:    Current Meds: Scheduled Meds: . acetaminophen  1,000 mg Oral 4 times per day   Or  . acetaminophen (TYLENOL) oral liquid 160 mg/5 mL  1,000 mg Oral 4 times per day  . antiseptic oral rinse  7 mL Mouth Rinse QID  . bisacodyl  10 mg Oral Daily  . cefUROXime (ZINACEF)  IV  1.5 g Intravenous Q12H  . deferoxamine (DESFERAL) IV  2,000 mg Intravenous 3 times per day  . dronabinol  5 mg Oral QAC lunch  . fentaNYL   Intravenous 6 times per day  . filgrastim  480 mcg Subcutaneous Daily  . filgrastim  480 mcg Subcutaneous Once  . ketotifen  2 drop Both Eyes BID  . levalbuterol  1.25 mg Nebulization Q6H  . metoCLOPramide (REGLAN) injection  10 mg Intravenous 4 times per day  . metoCLOPramide (REGLAN) injection  10 mg Intravenous 4 times per day  . metoprolol tartrate  12.5 mg Oral BID  . predniSONE  20  mg Oral BID WC  . senna-docusate  1 tablet Oral QHS  . simvastatin  20 mg Oral q1800  . thiamine  100 mg Oral Daily  . vancomycin  1,000 mg Intravenous Q12H   Continuous Infusions: . dextrose 5 % and 0.45% NaCl 100 mL/hr at 10/12/14 0131   PRN Meds:.ALPRAZolam, alum & mag hydroxide-simeth, diphenhydrAMINE **OR** diphenhydrAMINE, ondansetron (ZOFRAN) IV, potassium chloride, prochlorperazine, sorbitol, traMADol   Physical Exam: General appearance: alert, cooperative and no distress Heart: Regular rate and rhythm Lungs: clear to auscultation bilaterally Wound: Dressed and dry Chest tube: no air leak   Lab Results: CBC: Recent Labs  10/11/14 1435 10/12/14 0405  WBC 3.6* 2.2*  HGB 8.1* 7.2*  HCT 23.8* 21.2*  PLT 91* 71*   BMET:  Recent Labs  10/11/14 0552 10/11/14 0950 10/12/14 0405  NA 138 139 131*  K 4.1 4.2 3.3*  CL 107  --  99*  CO2 23  --  23  GLUCOSE 109*  --  147*  BUN 13  --  16  CREATININE 1.23  --  1.39*  CALCIUM 8.3*  --  7.2*    PT/INR:  Recent Labs  10/11/14 1300  LABPROT  16.5*  INR 1.32   Radiology: Dg Chest Portable 1 View  10/11/2014   CLINICAL DATA:  Status post chest tube placement. Status post stapling of 3 the left upper lobe blebs and talc pleurodesis on the left today.  EXAM: PORTABLE CHEST 1 VIEW  COMPARISON:  PA and lateral chest 10/10/2014.  FINDINGS: 2 left chest tubes are identified there is no pneumothorax. Right IJ catheter is again seen. Left IJ catheter tip projects just within the superior vena cava. Endotracheal tube is identified with the tip in the left mainstem bronchus. Review of the chart indicates this is a double lumen tube. Increased density in the left upper chest is consistent with changes related to pleurodesis. Surgical staples are also seen in the left upper chest consistent with bleb stapling. No pneumothorax is identified. Heart size is mildly enlarged.  IMPRESSION: Negative for pneumothorax with 2 left chest tubes in  place. Findings consistent with bleb stapling and left pleurodesis are identified.  Double lumen endotracheal tube is in place with the lumen within the left mainstem bronchus.  Left IJ approach central venous catheter tip projects in the upper superior vena cava.   Electronically Signed   By: Inge Rise M.D.   On: 10/11/2014 11:16     Assessment/Plan: S/P Procedure(s) (LRB): VIDEO ASSISTED THORACOSCOPY (Left) STAPLING OF BLEBS (Left) CT with no air leak, CXR stable with no pneumothorax.  Continue CTs to suction for now, hopefully can start to wean suction soon. Mobilize, continue IS/pulm toilet, d/c A-line, leave Foley one more day for volume measurement. Blood counts generally stable, Dr. Marin Olp following for h/o myelodysplastic syndrome. Possibly ready for transfer to stepdown soon.  COLLINS,GINA H 10/12/2014 7:38 AM

## 2014-10-12 NOTE — Progress Notes (Signed)
1 Day Post-Op Procedure(s) (LRB): VIDEO ASSISTED THORACOSCOPY (Left) STAPLING OF BLEBS (Left) Subjective: Vats for recurrent spont PNTX Myelodysplasia Acute on chronic anemia, thrombocytopenia S/P VATS and talc pleurodesis Objective: Vital signs in last 24 hours: Temp:  [97.7 F (36.5 C)-99.4 F (37.4 C)] 97.7 F (36.5 C) (09/28 0756) Pulse Rate:  [69-103] 77 (09/28 0700) Cardiac Rhythm:  [-] Normal sinus rhythm (09/28 0400) Resp:  [7-26] 9 (09/28 0700) BP: (87-122)/(51-81) 96/65 mmHg (09/28 0700) SpO2:  [93 %-100 %] 97 % (09/28 0736) Arterial Line BP: (97-211)/(49-137) 97/49 mmHg (09/28 0700)  Hemodynamic parameters for last 24 hours:  nsr  Intake/Output from previous day: 09/27 0701 - 09/28 0700 In: 7238 [P.O.:120; I.V.:3500; Blood:1518; IV JYNWGNFAO:1308] Out: 2845 [Urine:2015; Blood:200; Chest Tube:630] Intake/Output this shift:    General appearance: alert Scattered wheezes Lab Results:  Recent Labs  10/11/14 1435 10/12/14 0405  WBC 3.6* 2.2*  HGB 8.1* 7.2*  HCT 23.8* 21.2*  PLT 91* 71*   BMET:  Recent Labs  10/11/14 0552 10/11/14 0950 10/12/14 0405  NA 138 139 131*  K 4.1 4.2 3.3*  CL 107  --  99*  CO2 23  --  23  GLUCOSE 109*  --  147*  BUN 13  --  16  CREATININE 1.23  --  1.39*  CALCIUM 8.3*  --  7.2*    PT/INR:  Recent Labs  10/11/14 1300  LABPROT 16.5*  INR 1.32   ABG    Component Value Date/Time   PHART 7.409 10/12/2014 0350   HCO3 21.2 10/12/2014 0350   TCO2 22.3 10/12/2014 0350   ACIDBASEDEF 2.8* 10/12/2014 0350   O2SAT 97.3 10/12/2014 0350   CBG (last 3)  No results for input(s): GLUCAP in the last 72 hours.  Assessment/Plan: S/P Procedure(s) (LRB): VIDEO ASSISTED THORACOSCOPY (Left) STAPLING OF BLEBS (Left) Mobilize Diuresis Diabetes control   LOS: 3 days    Tharon Aquas Trigt III 10/12/2014

## 2014-10-12 NOTE — Clinical Documentation Improvement (Signed)
Cardiothoracic  Abnormal Lab/Test Results:  40,000 COLONIES/ml ENTEROCOCCUS SPECIES in urine culture  Possible Clinical Conditions associated with below indicators  UTI due to Enterococcus  Bacturia  Other Condition  Cannot Clinically Determine   Supporting Information: Urine culture     Please exercise your independent, professional judgment when responding. A specific answer is not anticipated or expected.   Thank You,  [employer] [phone]

## 2014-10-12 NOTE — Plan of Care (Signed)
Patient very shakey this morning, requiring 2 assists to get to chair, also receiving 2 units packed cells, so therefore not ambulated at this time. Will continue to assess.

## 2014-10-12 NOTE — Plan of Care (Signed)
Patient does not want to ambulate, states "too shakey" and he feels "bad". Encouraged to get up and walk and to sit in chair. Will continue to asses and encourage.

## 2014-10-13 ENCOUNTER — Ambulatory Visit: Payer: Medicare Other

## 2014-10-13 ENCOUNTER — Inpatient Hospital Stay (HOSPITAL_COMMUNITY): Payer: Medicare Other

## 2014-10-13 LAB — TYPE AND SCREEN
ABO/RH(D): A POS
Antibody Screen: NEGATIVE
UNIT DIVISION: 0
UNIT DIVISION: 0
UNIT DIVISION: 0
Unit division: 0
Unit division: 0
Unit division: 0

## 2014-10-13 LAB — RETICULOCYTES: RBC.: 3.29 MIL/uL — ABNORMAL LOW (ref 4.22–5.81)

## 2014-10-13 LAB — CBC WITH DIFFERENTIAL/PLATELET
BASOS ABS: 0 10*3/uL (ref 0.0–0.1)
Basophils Relative: 0 %
Eosinophils Absolute: 0 10*3/uL (ref 0.0–0.7)
Eosinophils Relative: 0 %
HEMATOCRIT: 27.2 % — AB (ref 39.0–52.0)
Hemoglobin: 9.6 g/dL — ABNORMAL LOW (ref 13.0–17.0)
LYMPHS ABS: 0.3 10*3/uL — AB (ref 0.7–4.0)
LYMPHS PCT: 9 %
MCH: 29 pg (ref 26.0–34.0)
MCHC: 35.3 g/dL (ref 30.0–36.0)
MCV: 82.2 fL (ref 78.0–100.0)
MONO ABS: 0.3 10*3/uL (ref 0.1–1.0)
Monocytes Relative: 9 %
NEUTROS ABS: 2.7 10*3/uL (ref 1.7–7.7)
Neutrophils Relative %: 82 %
Platelets: 58 10*3/uL — ABNORMAL LOW (ref 150–400)
RBC: 3.31 MIL/uL — AB (ref 4.22–5.81)
RDW: 15 % (ref 11.5–15.5)
WBC: 3.3 10*3/uL — ABNORMAL LOW (ref 4.0–10.5)

## 2014-10-13 LAB — POCT I-STAT, CHEM 8
BUN: 18 mg/dL (ref 6–20)
Calcium, Ion: 1.05 mmol/L — ABNORMAL LOW (ref 1.13–1.30)
Chloride: 95 mmol/L — ABNORMAL LOW (ref 101–111)
Creatinine, Ser: 1.3 mg/dL — ABNORMAL HIGH (ref 0.61–1.24)
Glucose, Bld: 136 mg/dL — ABNORMAL HIGH (ref 65–99)
HCT: 27 % — ABNORMAL LOW (ref 39.0–52.0)
Hemoglobin: 9.2 g/dL — ABNORMAL LOW (ref 13.0–17.0)
Potassium: 3.3 mmol/L — ABNORMAL LOW (ref 3.5–5.1)
Sodium: 136 mmol/L (ref 135–145)
TCO2: 23 mmol/L (ref 0–100)

## 2014-10-13 LAB — COMPREHENSIVE METABOLIC PANEL
ALT: 16 U/L — AB (ref 17–63)
AST: 18 U/L (ref 15–41)
Albumin: 2.6 g/dL — ABNORMAL LOW (ref 3.5–5.0)
Alkaline Phosphatase: 57 U/L (ref 38–126)
Anion gap: 8 (ref 5–15)
BUN: 15 mg/dL (ref 6–20)
CO2: 26 mmol/L (ref 22–32)
CREATININE: 1.29 mg/dL — AB (ref 0.61–1.24)
Calcium: 7.8 mg/dL — ABNORMAL LOW (ref 8.9–10.3)
Chloride: 102 mmol/L (ref 101–111)
GFR, EST NON AFRICAN AMERICAN: 56 mL/min — AB (ref >60)
Glucose, Bld: 114 mg/dL — ABNORMAL HIGH (ref 65–99)
Potassium: 2.9 mmol/L — ABNORMAL LOW (ref 3.5–5.1)
Sodium: 136 mmol/L (ref 135–145)
TOTAL PROTEIN: 6.8 g/dL (ref 6.5–8.1)
Total Bilirubin: 1 mg/dL (ref 0.3–1.2)

## 2014-10-13 MED ORDER — FUROSEMIDE 10 MG/ML IJ SOLN
40.0000 mg | Freq: Every day | INTRAMUSCULAR | Status: DC
  Filled 2014-10-13: qty 4

## 2014-10-13 MED ORDER — POTASSIUM CHLORIDE 10 MEQ/50ML IV SOLN
10.0000 meq | INTRAVENOUS | Status: AC
  Administered 2014-10-13 (×3): 10 meq via INTRAVENOUS
  Filled 2014-10-13 (×2): qty 50

## 2014-10-13 MED ORDER — SODIUM CHLORIDE 0.9 % IJ SOLN: 9.0000 mL | INTRAMUSCULAR | Status: DC | PRN

## 2014-10-13 MED ORDER — HYDROMORPHONE HCL 1 MG/ML IJ SOLN
1.0000 mg | INTRAMUSCULAR | Status: DC | PRN
  Administered 2014-10-13 – 2014-10-17 (×13): 1 mg via INTRAVENOUS
  Filled 2014-10-13 (×14): qty 1

## 2014-10-13 MED ORDER — DIPHENHYDRAMINE HCL 50 MG/ML IJ SOLN: 12.5000 mg | Freq: Four times a day (QID) | INTRAMUSCULAR | Status: DC | PRN

## 2014-10-13 MED ORDER — NALOXONE HCL 0.4 MG/ML IJ SOLN: 0.4000 mg | INTRAMUSCULAR | Status: DC | PRN

## 2014-10-13 MED ORDER — DIPHENHYDRAMINE HCL 12.5 MG/5ML PO ELIX
12.5000 mg | ORAL_SOLUTION | Freq: Four times a day (QID) | ORAL | Status: DC | PRN
  Filled 2014-10-13: qty 5

## 2014-10-13 MED ORDER — DIPHENHYDRAMINE HCL 25 MG PO CAPS: 25.0000 mg | ORAL_CAPSULE | Freq: Four times a day (QID) | ORAL | Status: DC | PRN

## 2014-10-13 MED ORDER — HYDROMORPHONE 0.3 MG/ML IV SOLN: INTRAVENOUS | Status: DC

## 2014-10-13 MED ORDER — ONDANSETRON HCL 4 MG/2ML IJ SOLN: 4.0000 mg | Freq: Four times a day (QID) | INTRAMUSCULAR | Status: DC | PRN

## 2014-10-13 MED ORDER — LEVALBUTEROL HCL 1.25 MG/0.5ML IN NEBU
1.2500 mg | INHALATION_SOLUTION | Freq: Three times a day (TID) | RESPIRATORY_TRACT | Status: DC
  Administered 2014-10-13: 1.25 mg via RESPIRATORY_TRACT
  Filled 2014-10-13 (×4): qty 0.5

## 2014-10-13 MED ORDER — KETOROLAC TROMETHAMINE 15 MG/ML IJ SOLN
15.0000 mg | Freq: Four times a day (QID) | INTRAMUSCULAR | Status: AC
  Administered 2014-10-13 (×2): 15 mg via INTRAVENOUS
  Filled 2014-10-13 (×2): qty 1

## 2014-10-13 NOTE — Progress Notes (Signed)
[name] is in a chair this morning. He still has the chest tubes in. Hopefully, they will be able to come out soon.  Pathology on his thoracic surgery with partial lung resection does not show any malignancy.  His last today are not back yet.  He's had 2 units of blood yesterday.  He is still on Neupogen. He is also on IV vancomycin and Zinacef.  So far, his vital signs are pretty good. He is afebrile. Blood pressure is 112/70.  On his physical exam, I really find no changes from yesterday.  We will have to see what his blood counts look like today. I need to make sure that we check a reticulocyte count on him.  [name] 33:6

## 2014-10-13 NOTE — Progress Notes (Signed)
Fentanyl 24ml wasted with Beckie Salts RN

## 2014-10-13 NOTE — Progress Notes (Signed)
Patient c/o tremors this am.  States this has been going on since Tuesday, which is the day that his Reglan started.  Discussed with Pharmacist, and will hold Reglan until MD comes out of surgery.  Patient very disappointed this am due to CT's not coming out today.  Told his wife that she needs to bring a gun in and shoot him so that this will all be over.  Will discuss above with Dr. Prescott Gum when he comes in.

## 2014-10-13 NOTE — Progress Notes (Signed)
Patient ID: [name], male   DOB: 1945/05/01, 70 y.o.   MRN: [id number] EVENING ROUNDS NOTE :     [address]       [place] [address]             [phone]                 2 Days Post-Op Procedure(s) (LRB): VIDEO ASSISTED THORACOSCOPY (Left) STAPLING OF BLEBS (Left)  Total Length of Stay:  LOS: 4 days  BP 119/62 mmHg  Pulse 76  Temp(Src) 98.4 F (36.9 C) (Oral)  Resp 12  Ht 5\' 10"  (1.778 m)  Wt 181 lb (82.1 kg)  BMI 25.97 kg/m2  SpO2 97%  .Intake/Output      09/29 0701 - 09/30 0700   P.O. 300   I.V. (mL/kg) 360 (4.4)   Blood    IV Piggyback 700   Total Intake(mL/kg) 1360 (16.6)   Urine (mL/kg/hr) 1475 (1.3)   Chest Tube    Total Output 1475   Net -115         . dextrose 5 % and 0.45% NaCl 30 mL/hr at 10/13/14 0800     Lab Results  Component Value Date   WBC 3.3* 10/13/2014   HGB 9.6* 10/13/2014   HCT 27.2* 10/13/2014   PLT 58* 10/13/2014   GLUCOSE 114* 10/13/2014   CHOL 93 05/26/2014   TRIG 127.0 05/26/2014   HDL 28.80* 05/26/2014   LDLDIRECT 55.2 10/28/2012   LDLCALC 39 05/26/2014   ALT 16* 10/13/2014   AST 18 10/13/2014   NA 136 10/13/2014   K 2.9* 10/13/2014   CL 102 10/13/2014   CREATININE 1.29* 10/13/2014   BUN 15 10/13/2014   CO2 26 10/13/2014   TSH 1.550 06/05/2013   INR 1.32 10/11/2014   HGBA1C 5.6 06/05/2013   Stable day, no air leak k being replaced  Grace Isaac MD  Beeper [phone] Office [phone] 10/13/2014 8:34 PM

## 2014-10-13 NOTE — Evaluation (Signed)
Physical Therapy Evaluation Patient Details Name: [name] MRN: [id number] DOB: [birth year] Today's Date: 10/13/2014   History of Present Illness  69 y.o. male admitted  for left tension pneumothorax (admitted for same 09/14/14); Lt VATS 9/27; onset resting tremor (extremities and head) 9/29 ? due to drug side effect.  PMHx -LE DVT, s/p IVC filter 09/18/14, HTN, AAA, tobacco abuse (recently quit "cold [place]", normocytic anemia, MDS (myelodysplastic syndrome), MI, depression, cervical fusion, s/p chemotherapy.     Clinical Impression  Patient is s/p above surgery resulting in functional limitations due to the deficits listed below (see PT Problem List). Patient's mobility currently impaired primarily due to onset of tremors (?drug-related). Patient will benefit from skilled PT to increase their independence and safety with mobility to allow discharge to the venue listed below.       Follow Up Recommendations Outpatient PT (vs none depending on tremors)    Equipment Recommendations   (TBD)    Recommendations for Other Services       Precautions / Restrictions Precautions Precautions: Fall Precaution Comments: multiple lines; 2 chest tubes      Mobility  Bed Mobility Overal bed mobility: Needs Assistance Bed Mobility: Supine to Sit     Supine to sit: Min assist;HOB elevated     General bed mobility comments: ICU bed deflated; exit to his Lt due to room setup; assist due to tremors of torso as lifting off the mattress  Transfers Overall transfer level: Needs assistance Equipment used: Rolling walker (2 wheeled) Transfers: Sit to/from Stand Sit to Stand: Min guard         General transfer comment: x 3 (twice while PT instructing him to sit still/sit down and he stood up ready to go prior to lines/equipment prepared); vc for use of RW  Ambulation/Gait Ambulation/Gait assistance: Min assist Ambulation Distance (Feet): 180 Feet Assistive device: Rolling walker (2  wheeled) Gait Pattern/deviations: Step-through pattern;Decreased stride length (legs tremulous) Gait velocity: decreased      Stairs            Wheelchair Mobility    Modified Rankin (Stroke Patients Only)       Balance   Sitting-balance support: No upper extremity supported;Feet supported Sitting balance-Leahy Scale: Fair     Standing balance support: Bilateral upper extremity supported Standing balance-Leahy Scale: Poor                               Pertinent Vitals/Pain SaO2 on 2L 100%; on RA 95%; walking on RA 93-95%; resumed O2 on return to room  Pain Assessment: Faces Faces Pain Scale: Hurts little more Pain Location: Lt thorax Pain Descriptors / Indicators: Operative site guarding Pain Intervention(s): Limited activity within patient's tolerance;Monitored during session;Repositioned    Home Living Family/patient expects to be discharged to:: Private residence Living Arrangements: Spouse/significant other Available Help at Discharge: Family;Available 24 hours/day Type of Home: House Home Access: Stairs to enter Entrance Stairs-Rails: None (wall on one side) Entrance Stairs-Number of Steps: 3 Home Layout: One level Home Equipment: None      Prior Function Level of Independence: Independent               Hand Dominance   Dominant Hand: Right    Extremity/Trunk Assessment   Upper Extremity Assessment: RUE deficits/detail;LUE deficits/detail RUE Deficits / Details: bil UE tremors at rest and worsen with open-chain activities (Lt worse than Rt)     LUE Deficits / Details:  bil UE tremors at rest and worsen with open-chain activities (Lt worse than Rt)   Lower Extremity Assessment: RLE deficits/detail;LLE deficits/detail RLE Deficits / Details: tremors noted with standing and during gait seemed to decr LLE Deficits / Details: tremors noted with standing and during gait seemed to decr  Cervical / Trunk Assessment: Other  exceptions  Communication   Communication: No difficulties  Cognition Arousal/Alertness: Awake/alert Behavior During Therapy: Flat affect;Impulsive Overall Cognitive Status: Within Functional Limits for tasks assessed                      General Comments General comments (skin integrity, edema, etc.): Wife present     Exercises        Assessment/Plan    PT Assessment Patient needs continued PT services  PT Diagnosis Difficulty walking   PT Problem List Decreased activity tolerance;Decreased balance;Decreased mobility;Decreased coordination;Decreased knowledge of use of DME;Decreased knowledge of precautions;Pain  PT Treatment Interventions DME instruction;Gait training;Stair training;Functional mobility training;Therapeutic exercise;Therapeutic activities;Balance training;Patient/family education   PT Goals (Current goals can be found in the Care Plan section) Acute Rehab PT Goals Patient Stated Goal: stop tremors and go home PT Goal Formulation: With patient/family Time For Goal Achievement: 10/19/14 Potential to Achieve Goals: Good    Frequency Min 3X/week   Barriers to discharge        Co-evaluation               End of Session Equipment Utilized During Treatment: Gait belt Activity Tolerance: Patient tolerated treatment well Patient left: in chair;with call bell/phone within reach;with family/visitor present Nurse Communication: Mobility status (ambulated on RA)         Time: 4967-5916 PT Time Calculation (min) (ACUTE ONLY): 33 min   Charges:   PT Evaluation $Initial PT Evaluation Tier I: 1 Procedure PT Treatments $Gait Training: 8-22 mins   PT G Codes:        [name],[name] 11-06-14, 3:42 PM Pager [phone]

## 2014-10-13 NOTE — Progress Notes (Addendum)
[facility] DAILY ICU PROGRESS NOTE                   [address]            [place] [address]          [phone]   2 Days Post-Op Procedure(s) (LRB): VIDEO ASSISTED THORACOSCOPY (Left) STAPLING OF BLEBS (Left)  Total Length of Stay:  LOS: 4 days   Subjective: OOB in chair. Walked already this am. Main complaint is that Fentanyl causes his to be "jittery".  He would like something else ordered for pain.  Passing flatus, no nausea.   Objective: Vital signs in last 24 hours: Temp:  [97.5 F (36.4 C)-98.6 F (37 C)] 98.4 F (36.9 C) (09/29 0400) Pulse Rate:  [66-92] 92 (09/29 0500) Cardiac Rhythm:  [-] Normal sinus rhythm (09/29 0000) Resp:  [7-24] 21 (09/29 0603) BP: (90-130)/(59-73) 112/70 mmHg (09/29 0603) SpO2:  [93 %-100 %] 99 % (09/29 0603) Arterial Line BP: (95-142)/(49-79) 109/57 mmHg (09/28 1300)  Filed Weights   10/09/14 2321  Weight: 181 lb (82.1 kg)    Weight change:    Hemodynamic parameters for last 24 hours:    Intake/Output from previous day: 09/28 0701 - 09/29 0700 In: 4025 [P.O.:300; I.V.:695; Blood:930; IV GGYIRSWNI:6270] Out: 3500 [Urine:4450; Chest Tube:230]  Intake/Output this shift:    Current Meds: Scheduled Meds: . acetaminophen  1,000 mg Oral 4 times per day   Or  . acetaminophen (TYLENOL) oral liquid 160 mg/5 mL  1,000 mg Oral 4 times per day  . antiseptic oral rinse  7 mL Mouth Rinse QID  . bisacodyl  10 mg Oral Daily  . cefUROXime (ZINACEF)  IV  1.5 g Intravenous Q12H  . deferoxamine (DESFERAL) IV  2,000 mg Intravenous 3 times per day  . dronabinol  5 mg Oral QAC lunch  . fentaNYL   Intravenous 6 times per day  . filgrastim  480 mcg Subcutaneous Daily  . furosemide  40 mg Intravenous BID  . ketotifen  2 drop Both Eyes BID  . levalbuterol  1.25 mg Nebulization TID  . metoCLOPramide (REGLAN) injection  10 mg Intravenous 4 times per day  . metoprolol tartrate  12.5 mg Oral BID  . predniSONE  20 mg Oral BID WC  .  senna-docusate  1 tablet Oral QHS  . simvastatin  20 mg Oral q1800  . thiamine  100 mg Oral Daily  . vancomycin  750 mg Intravenous Q12H   Continuous Infusions: . dextrose 5 % and 0.45% NaCl 30 mL/hr at 10/12/14 1400   PRN Meds:.ALPRAZolam, alum & mag hydroxide-simeth, diphenhydrAMINE **OR** diphenhydrAMINE, ondansetron (ZOFRAN) IV, potassium chloride, prochlorperazine, sorbitol, traMADol  Physical Exam: General appearance: alert, cooperative and no distress Heart: regular rate and rhythm Lungs: clear to auscultation bilaterally Wound: Dressed and dry Chest tube: No air leak  Lab Results: CBC: Recent Labs  10/11/14 1435 10/12/14 0405 10/12/14 1501  WBC 3.6* 2.2*  --   HGB 8.1* 7.2* 8.8*  HCT 23.8* 21.2* 26.0*  PLT 91* 71*  --    BMET:  Recent Labs  10/11/14 0552  10/12/14 0405 10/12/14 1501  NA 138  < > 131* 136  K 4.1  < > 3.3* 3.6  CL 107  --  99* 96*  CO2 23  --  23  --   GLUCOSE 109*  --  147* 215*  BUN 13  --  16 17  CREATININE 1.23  --  1.39* 1.30*  CALCIUM 8.3*  --  7.2*  --   < > = values in this interval not displayed.  PT/INR:  Recent Labs  10/11/14 1300  LABPROT 16.5*  INR 1.32   Radiology: Dg Chest Port 1 View  10/13/2014   CLINICAL DATA:  Follow-up pneumothorax, status post bleb stapling on the left.  EXAM: PORTABLE CHEST 1 VIEW  COMPARISON:  Portable chest x-ray of October 12, 2014  FINDINGS: The lungs are well-expanded. No pneumothorax on the left is evident. The 2 chest tubes are unchanged. There is some pleural fluid low layering laterally along the thoracic wall which is stable. The interstitial markings of the left lung remain increased diffusely. There is no significant mediastinal shift. The right lung is well expanded and grossly clear. The cardiac silhouette is top-normal in size. The pulmonary vascularity is normal. The Port-A-Cath appliance tip projects over the midportion of the SVC. The left internal jugular venous catheter tip projects  over the proximal SVC.  IMPRESSION: Slight interval improvement in aeration of the left lung. There is no pneumothorax. Pulmonary interstitial edema or infiltrate on the left persists. The support tubes are in reasonable position.   Electronically Signed   By: David  Martinique M.D.   On: 10/13/2014 07:22   Dg Abd Portable 1v  10/12/2014   CLINICAL DATA:  Abdominal distension.  EXAM: PORTABLE ABDOMEN - 1 VIEW  COMPARISON:  Radiographs 06/10/2014  FINDINGS: Moderate air distended small bowel and colon suggesting a diffuse ileus. No free air. The soft tissue shadows are grossly maintained. The bony structures are intact. Stable aortoiliac stent graft. An IVC filter is noted.  IMPRESSION: Ileus appearing bowel gas pattern.  No free air.   Electronically Signed   By: Marijo Sanes M.D.   On: 10/12/2014 11:42     Assessment/Plan: S/P Procedure(s) (LRB): VIDEO ASSISTED THORACOSCOPY (Left) STAPLING OF BLEBS (Left)  CT with no air leak and CXR stable. Will decrease CTs to water seal. Hopefully can d/c 1 CT soon.  Pain control is an issue. The patient has intolerances to Oxy, Vicodin and Fentanyl, but states he can take Dilaudid. Will start low dose Dilaudid PCA until CTs are removed.  Will not give Toradol since creatinine is elevated. Continue po Ultram.  Renal- Cr trending up. Good response to Lasix. Will continue to watch.  Mild postop ileus- Tolerating liquids, passing flatus. Advance diet as tolerated.  Myelodysplastic syndrome- Care per Dr.Ennever.  Continue ambulation, pulm toilet.  Hopefully ready for stepdown soon.   COLLINS,GINA H 10/13/2014 7:41 AM  Patient's hemoglobin stable, chest tube output greater than 200 cc so we'll leave both tubes in today Patient ready for transfer to stepdown Patient exhibiting signs of tardive dyskinesia so we'll stop Compazine and Reglan. Situation discussed with patient's wife at bedside and all questions addressed.  patient examined and medical record  reviewed,agree with above note. Tharon Aquas Trigt III 10/13/2014

## 2014-10-13 NOTE — Progress Notes (Signed)
Pt and Pt wife refusing HHN at this time due to concern of possible reaction from it. Pt c/o tremors and RN to talk with MD about concerns. Will hold HHN at this time per pt's request

## 2014-10-13 NOTE — Progress Notes (Signed)
PT Cancellation Note  Patient Details Name: KHY PITRE MRN: [id number] DOB: 28-Jun-1945   Cancelled Treatment:    Reason Eval Not Completed: Other (comment)  Spoke with patient and Altha Harm, Therapist, sports. Pt with resting tremor x 4 extremities possibly due to reaction to Reglan. Has walked this morning pushing w/c with nursing. Does not usually use a device or have balance problems. Will see later today to give meds a chance to get out of his system and get a truer assessment of his mobility needs. Patient and RN in agreement   SASSER,LYNN 10/13/2014, 9:32 AM Pager [phone]

## 2014-10-13 NOTE — Progress Notes (Signed)
Fentanyl wasted 7cc with Warden Fillers, RN in trash.

## 2014-10-14 ENCOUNTER — Ambulatory Visit: Payer: Medicare Other

## 2014-10-14 ENCOUNTER — Encounter: Payer: Self-pay | Admitting: Hematology & Oncology

## 2014-10-14 ENCOUNTER — Inpatient Hospital Stay (HOSPITAL_COMMUNITY): Payer: Medicare Other

## 2014-10-14 LAB — COMPREHENSIVE METABOLIC PANEL
ALBUMIN: 2.5 g/dL — AB (ref 3.5–5.0)
ALK PHOS: 51 U/L (ref 38–126)
ALT: 13 U/L — AB (ref 17–63)
AST: 13 U/L — ABNORMAL LOW (ref 15–41)
Anion gap: 6 (ref 5–15)
BUN: 15 mg/dL (ref 6–20)
CALCIUM: 7.6 mg/dL — AB (ref 8.9–10.3)
CO2: 27 mmol/L (ref 22–32)
Chloride: 101 mmol/L (ref 101–111)
Creatinine, Ser: 1.35 mg/dL — ABNORMAL HIGH (ref 0.61–1.24)
GFR calc Af Amer: >60 mL/min (ref >60)
GFR calc non Af Amer: 53 mL/min — ABNORMAL LOW (ref >60)
GLUCOSE: 118 mg/dL — AB (ref 65–99)
Potassium: 3.6 mmol/L (ref 3.5–5.1)
SODIUM: 134 mmol/L — AB (ref 135–145)
Total Bilirubin: 1.1 mg/dL (ref 0.3–1.2)
Total Protein: 6.1 g/dL — ABNORMAL LOW (ref 6.5–8.1)

## 2014-10-14 LAB — CBC WITH DIFFERENTIAL/PLATELET
BASOS ABS: 0 10*3/uL (ref 0.0–0.1)
Basophils Relative: 0 %
EOS ABS: 0 10*3/uL (ref 0.0–0.7)
Eosinophils Relative: 0 %
HCT: 24.8 % — ABNORMAL LOW (ref 39.0–52.0)
HEMOGLOBIN: 8.7 g/dL — AB (ref 13.0–17.0)
LYMPHS PCT: 5 %
Lymphs Abs: 0.2 10*3/uL — ABNORMAL LOW (ref 0.7–4.0)
MCH: 28.9 pg (ref 26.0–34.0)
MCHC: 35.1 g/dL (ref 30.0–36.0)
MCV: 82.4 fL (ref 78.0–100.0)
Monocytes Absolute: 0.3 10*3/uL (ref 0.1–1.0)
Monocytes Relative: 8 %
NEUTROS PCT: 87 %
Neutro Abs: 3.8 10*3/uL (ref 1.7–7.7)
Platelets: 45 10*3/uL — ABNORMAL LOW (ref 150–400)
RBC: 3.01 MIL/uL — AB (ref 4.22–5.81)
RDW: 15.1 % (ref 11.5–15.5)
WBC: 4.3 10*3/uL (ref 4.0–10.5)

## 2014-10-14 LAB — RETICULOCYTES
RBC.: 3.01 MIL/uL — ABNORMAL LOW (ref 4.22–5.81)
Retic Ct Pct: <0.4 % — ABNORMAL LOW (ref 0.4–3.1)

## 2014-10-14 MED ORDER — ENSURE ENLIVE PO LIQD
237.0000 mL | Freq: Two times a day (BID) | ORAL | Status: DC
  Administered 2014-10-14 – 2014-10-17 (×4): 237 mL via ORAL

## 2014-10-14 NOTE — Progress Notes (Signed)
[name] is slowly improving. Hopefully, the chest tubes will be taken out today and tomorrow.  He has had no problems with fever. He's had no bleeding. He is still on some Neupogen. His white cell count is 4.3 today. His hemoglobin 8.7. Platelet count 45,000.  He is out of bed. I think he is walking a little bit.  His appetite is still marginal. He has had no nausea or vomiting.  His physical exam shows temperature 99.8. Pulse 94. Blood pressure 112/67. His head and neck exam shows no scleral icterus. He has no oral lesions. There is no adenopathy in the neck. Lungs are pretty clear bilaterally. He has some crackles bilaterally. Cardiac exam regular rate and rhythm with no murmurs, rubs or bruits. Abdomen is soft. He has good bowel sounds. There is no fluid wave. Extremities shows no clubbing, cyanosis or edema.  Again, I would try to hold on transfusing him with platelets unless he is bleeding or his plate count is less than 20,000. I would hold off on giving him packed blood cells unless his hemoglobin gets below 7.   He continues on the Desferal.  He is on prednisone. I'm not sure as to why he is on prednisone.  I appreciate all great care that he is getting.  [name] E.  1 Timothy 2:5

## 2014-10-14 NOTE — Progress Notes (Signed)
[facility] BRIEF SICU PROGRESS NOTE  3 Days Post-Op  S/P Procedure(s) (LRB): VIDEO ASSISTED THORACOSCOPY (Left) STAPLING OF BLEBS (Left)   Stable day NSR w/ stable BP O2 sats 95% on 2 L/min  Plan: Continue current plan  Rexene Alberts, MD 10/14/2014 8:29 PM

## 2014-10-14 NOTE — Progress Notes (Addendum)
[facility] DAILY ICU PROGRESS NOTE                   [address]            [place] [address]          [phone]   3 Days Post-Op Procedure(s) (LRB): VIDEO ASSISTED THORACOSCOPY (Left) STAPLING OF BLEBS (Left)  Total Length of Stay:  LOS: 5 days   Subjective: Feels a little better this am. Rested better last night and pain better controlled.   Objective: Vital signs in last 24 hours: Temp:  [97.3 F (36.3 C)-99.8 F (37.7 C)] 99.8 F (37.7 C) (09/30 0353) Pulse Rate:  [69-116] 94 (09/30 0600) Cardiac Rhythm:  [-] Normal sinus rhythm (09/29 1956) Resp:  [9-20] 17 (09/30 0600) BP: (97-124)/(57-77) 112/67 mmHg (09/30 0600) SpO2:  [44 %-100 %] 88 % (09/30 0600) Weight:  [198 lb 3.1 oz (89.9 kg)] 198 lb 3.1 oz (89.9 kg) (09/30 0300)  Filed Weights   10/09/14 2321 10/14/14 0300  Weight: 181 lb (82.1 kg) 198 lb 3.1 oz (89.9 kg)    Weight change:    Hemodynamic parameters for last 24 hours:    Intake/Output from previous day: 09/29 0701 - 09/30 0700 In: 1360 [P.O.:300; I.V.:360; IV Piggyback:700] Out: 4967 [Urine:2400; Chest Tube:48]  CBGs: 136-118    Current Meds: Scheduled Meds: . acetaminophen  1,000 mg Oral 4 times per day   Or  . acetaminophen (TYLENOL) oral liquid 160 mg/5 mL  1,000 mg Oral 4 times per day  . antiseptic oral rinse  7 mL Mouth Rinse QID  . bisacodyl  10 mg Oral Daily  . cefUROXime (ZINACEF)  IV  1.5 g Intravenous Q12H  . deferoxamine (DESFERAL) IV  2,000 mg Intravenous 3 times per day  . dronabinol  5 mg Oral QAC lunch  . filgrastim  480 mcg Subcutaneous Daily  . furosemide  40 mg Intravenous Daily  . ketotifen  2 drop Both Eyes BID  . metoprolol tartrate  12.5 mg Oral BID  . predniSONE  20 mg Oral BID WC  . senna-docusate  1 tablet Oral QHS  . simvastatin  20 mg Oral q1800  . thiamine  100 mg Oral Daily   Continuous Infusions: . dextrose 5 % and 0.45% NaCl 30 mL/hr at 10/13/14 0800   PRN Meds:.ALPRAZolam, alum & mag  hydroxide-simeth, diphenhydrAMINE, HYDROmorphone (DILAUDID) injection, ondansetron (ZOFRAN) IV, potassium chloride, sorbitol, traMADol  Physical Exam: General appearance: alert, cooperative and no distress Neurologic: Resting tremor, L>R hand Heart: regular rate and rhythm and mildly tachy around 110 Lungs: Few coarse BS bilaterally Wound: Dressed and dry  Chest tube: No air leak    Lab Results: CBC: Recent Labs  10/13/14 0643 10/13/14 2046 10/14/14 0411  WBC 3.3*  --  4.3  HGB 9.6* 9.2* 8.7*  HCT 27.2* 27.0* 24.8*  PLT 58*  --  45*   BMET:  Recent Labs  10/13/14 0643 10/13/14 2046 10/14/14 0411  NA 136 136 134*  K 2.9* 3.3* 3.6  CL 102 95* 101  CO2 26  --  27  GLUCOSE 114* 136* 118*  BUN 15 18 15   CREATININE 1.29* 1.30* 1.35*  CALCIUM 7.8*  --  7.6*    PT/INR:  Recent Labs  10/11/14 1300  LABPROT 16.5*  INR 1.32   Radiology: No results found.   Assessment/Plan: S/P Procedure(s) (LRB): VIDEO ASSISTED THORACOSCOPY (Left) STAPLING OF BLEBS (Left)  CT with no air leak  and CXR with no pneumothorax. Plan d/c anterior CT today. Output overnight low, < 50 ml.  Renal- Cr trending up. D/c Lasix and hold further Toradol.  Symptoms of tardive dyskinesia improved since Reglan and Compazine d/c'ed. Continue to monitor.  Myelodysplastic syndrome- Care per Dr.Ennever.  Continue ambulation, pulm toilet.  Plan tx stepdown.   COLLINS,GINA H 10/14/2014 7:35 AM  patient examined and medical record reviewed,agree with above note. Tharon Aquas Trigt III 10/14/2014

## 2014-10-14 NOTE — Progress Notes (Signed)
Physical Therapy Treatment Patient Details Name: [name] MRN: [id number] DOB: 1945/12/27 Today's Date: 10/14/2014    History of Present Illness 69 y.o. male admitted  for left tension pneumothorax (admitted for same 09/14/14); Lt VATS 9/27; onset resting tremor (extremities and head) 9/29 ? due to drug side effect.  PMHx -LE DVT, s/p IVC filter 09/18/14, HTN, AAA, tobacco abuse (recently quit "cold [place]", normocytic anemia, MDS (myelodysplastic syndrome), MI, depression, cervical fusion, s/p chemotherapy.     PT Comments    Tremors remain; slightly less in legs than 9/29. HR significantly higher today with walking (at rest 108, walking 132). Pt very fatigued at end of session with head and UE tremors increased compared to beginning of session. Recommend OT Consult for ADL training and perhaps weighted utensils/cup for self-feeding.    Follow Up Recommendations  Outpatient PT (vs none depending on tremors)     Equipment Recommendations   (TBD)    Recommendations for Other Services OT consult     Precautions / Restrictions Precautions Precautions: Fall Precaution Comments: multiple lines; 2 chest tubes    Mobility  Bed Mobility Overal bed mobility: Needs Assistance Bed Mobility: Supine to Sit     Supine to sit: Min assist;HOB elevated     General bed mobility comments: ICU bed deflated; exit to his Lt due to room setup; assist due to tremors of torso as lifting off the mattress  Transfers Overall transfer level: Needs assistance Equipment used: Rolling walker (2 wheeled) Transfers: Sit to/from Stand Sit to Stand: Min guard         General transfer comment: x 2; vc for use of RW; no impulsivity today  Ambulation/Gait Ambulation/Gait assistance: Min guard Ambulation Distance (Feet): 300 Feet Assistive device: Rolling walker (2 wheeled) Gait Pattern/deviations: Step-through pattern;Decreased stride length (tremors) Gait velocity: decreased   General Gait  Details: LE tremors felt to be less, more steady; head and UE    Stairs            Wheelchair Mobility    Modified Rankin (Stroke Patients Only)       Balance     Sitting balance-Leahy Scale: Fair     Standing balance support: Bilateral upper extremity supported Standing balance-Leahy Scale: Poor                      Cognition Arousal/Alertness: Awake/alert Behavior During Therapy: Flat affect;Impulsive Overall Cognitive Status: Within Functional Limits for tasks assessed                      Exercises      General Comments        Pertinent Vitals/Pain Pain Assessment: 0-10 Pain Score: 6  Pain Location: Lt thorax Pain Descriptors / Indicators: Operative site guarding Pain Intervention(s): Limited activity within patient's tolerance;Monitored during session;Repositioned;Patient requesting pain meds-RN notified    Home Living                      Prior Function            PT Goals (current goals can now be found in the care plan section) Acute Rehab PT Goals Patient Stated Goal: stop tremors and go home PT Goal Formulation: With patient/family Time For Goal Achievement: 10/19/14 Potential to Achieve Goals: Good Progress towards PT goals: Progressing toward goals    Frequency  Min 3X/week    PT Plan Current plan remains appropriate    Co-evaluation  End of Session Equipment Utilized During Treatment: Gait belt Activity Tolerance: Patient limited by fatigue;Patient limited by pain (elevated HR with standing rests) Patient left: in chair;with call bell/phone within reach;with family/visitor present;with nursing/sitter in room     Time: 0826-0900 PT Time Calculation (min) (ACUTE ONLY): 34 min  Charges:  $Gait Training: 23-37 mins                    G Codes:      SASSER,LYNN 20-Oct-2014, 10:04 AM Pager [phone]

## 2014-10-14 NOTE — Discharge Instructions (Signed)
Video-Assisted Thoracic Surgery °Care After °Refer to this sheet in the next few weeks. These instructions provide you with information on caring for yourself after your procedure. Your caregiver may also give you more specific instructions. Your procedure has been planned according to current medical practices, but problems sometimes occur. Call your caregiver if you have any problems or questions after your procedure. °HOME CARE INSTRUCTIONS  °· Only take over-the-counter or prescription medications as directed. °· Only take pain medications (narcotics) as directed. °· Do not drive until your caregiver approves. Driving while taking narcotics or soon after surgery can be dangerous, so discuss the specific timing with your caregiver. °· Avoid activities that use your chest muscles, such as lifting heavy objects, for at least 3-4 weeks.   °· Take deep breaths to expand the lungs and to protect against pneumonia. °· Do breathing exercises as directed by your caregiver. If you were given an incentive spirometer to help with breathing, use it as directed. °· You may resume a normal diet and activities when you feel you are able to or as directed. °· Do not take a bath until your caregiver says it is OK. Use the shower instead.   °· Keep the bandage (dressing) covering the area where the chest tube was inserted (incision site) dry for 48 hours. After 48 hours, remove the dressing unless there is new drainage. °· Remove dressings as directed by your caregiver. °· Change dressings if necessary or as directed. °· Keep all follow-up appointments. It is important for you to see your caregiver after surgery to discuss appropriate follow-up care and surveillance, if it is necessary. °SEEK MEDICAL CARE: °· You feel excessive or increasing pain at an incision site. °· You notice bleeding, skin irritation, drainage, swelling, or redness at an incision site. °· There is a bad smell coming from an incision or dressing. °· It feels  like your heart is fluttering or beating rapidly. °· Your pain medication does not relieve your pain. °SEEK IMMEDIATE MEDICAL CARE IF:  °· You have a fever.   °· You have chest pain.  °· You have a rash. °· You have shortness of breath. °· You have trouble breathing.   °· You feel weak, lightheaded, dizzy, or faint.   °MAKE SURE YOU:  °· Understand these instructions.   °· Will watch your condition.   °· Will get help right away if you are not doing well or get worse. °Document Released: 04/27/2012 Document Reviewed: 04/27/2012 °[employer]® Patient Information ©2015 [employer]. This information is not intended to replace advice given to you by your health care provider. Make sure you discuss any questions you have with your health care provider. ° °

## 2014-10-14 NOTE — Discharge Summary (Signed)
Physician Discharge Summary  Patient ID: [name] MRN: [id number] DOB/AGE: 1945/03/24 69 y.o.  Admit date: 10/09/2014 Discharge date: 10/17/2014  Admission Diagnoses:  Patient Active Problem List   Diagnosis Date Noted  . Pneumothorax, left 10/09/2014  . Hypotestosteronemia 10/04/2014  . DVT (deep venous thrombosis) ([place])   . Thrombocytopenia ([place])   . Normocytic anemia   . Pneumothorax   . Recurrent fever of unknown cause   . Spontaneous pneumothorax 09/14/2014  . Chest pain 09/14/2014  . COPD (chronic obstructive pulmonary disease) ([place]) 09/14/2014  . MDS (myelodysplastic syndrome), high grade ([place]) 04/13/2014  . Pancytopenia ([place]) 03/30/2014  . Screening for colon cancer 10/15/2013  . Abdominal aneurysm without mention of rupture 08/23/2013  . Epigastric abdominal pain 08/05/2013  . Nausea & vomiting 08/04/2013  . Diverticulitis of colon without hemorrhage 07/14/2013  . AAA (abdominal aortic aneurysm) ([place]) 07/02/2013  . Chronic cholecystitis 06/21/2013  . Symptomatic cholelithiasis 06/18/2013  . Malnutrition of moderate degree ([place]) 06/15/2013  . PNA (pneumonia) 06/13/2013  . Diverticulitis 06/05/2013  . AAA (abdominal aortic aneurysm) without rupture ([place]) 06/05/2013  . BRADYCARDIA 12/20/2008  . HYPERLIPIDEMIA 11/11/2008  . TOBACCO ABUSE 11/11/2008  . HYPERTENSION, UNSPECIFIED 11/11/2008  . MYOCARDIAL INFARCTION, HX OF 11/11/2008  . Coronary atherosclerosis of native coronary artery 11/11/2008   Discharge Diagnoses:   Patient Active Problem List   Diagnosis Date Noted  . Pneumothorax, left 10/09/2014  . Hypotestosteronemia 10/04/2014  . DVT (deep venous thrombosis) ([place])   . Thrombocytopenia ([place])   . Normocytic anemia   . Pneumothorax   . Recurrent fever of unknown cause   . Spontaneous pneumothorax 09/14/2014  . Chest pain 09/14/2014  . COPD (chronic obstructive pulmonary disease) ([place]) 09/14/2014  . MDS (myelodysplastic syndrome), high grade ([place])  04/13/2014  . Pancytopenia ([place]) 03/30/2014  . Screening for colon cancer 10/15/2013  . Abdominal aneurysm without mention of rupture 08/23/2013  . Epigastric abdominal pain 08/05/2013  . Nausea & vomiting 08/04/2013  . Diverticulitis of colon without hemorrhage 07/14/2013  . AAA (abdominal aortic aneurysm) ([place]) 07/02/2013  . Chronic cholecystitis 06/21/2013  . Symptomatic cholelithiasis 06/18/2013  . Malnutrition of moderate degree ([place]) 06/15/2013  . PNA (pneumonia) 06/13/2013  . Diverticulitis 06/05/2013  . AAA (abdominal aortic aneurysm) without rupture ([place]) 06/05/2013  . BRADYCARDIA 12/20/2008  . HYPERLIPIDEMIA 11/11/2008  . TOBACCO ABUSE 11/11/2008  . HYPERTENSION, UNSPECIFIED 11/11/2008  . MYOCARDIAL INFARCTION, HX OF 11/11/2008  . Coronary atherosclerosis of native coronary artery 11/11/2008  Enterococcus UTI  Discharged Condition: good  History of Present Illness:  [name] is a 69 yo white male with history of CAD, AAA, Hypertension, Myelodysplastic syndrome, Nicotine abuse, and H/O of Pneumothorax.  He is well known to [facility] and was originally treated by Dr. Roxan [name] in August.  At that time the patient presented with a 2 week complaint of left sided chest pain.  CT scan was obtained and was unremarkable.  His chest discomfort worsened and was associated with shortness of breath.  He presented to the ED at which time he was found to have a Pneumothorax.  He was treated with chest tube placement with resolution of pneumothorax.  He initially did well.  He was last seen by Dr. Roxan [name] on 10/04/2014 at which time the patient's CXR was stable.  However, he presented to the [facility] ED on 10/09/2014 with complaints of shortness of breath.  Workup in ED consisted of CXR which showed a large pneumothorax.  Due to this [facility] was consulted  for chest tube placement.    Hospital Course:   He was admitted and underwent chest tube placement.  Follow up CXR showed complete  resolution of pneumothorax.  His pleurovac showed evidence of air leak from the chest tube.  CT scan from August was reviewed and showed the presence of blebs along left upper lobe.  It was felt being this was a recurrence the patient would require VATS procedure.  He was taken to the operating room on 10/11/2014.  She underwent Left VATs with stapling of apical blebs x 3.  He also underwent Talc Pleurodesis.  He tolerated the procedure without difficulty, was extubated and taken to the SICU in stable condition.  The patient has remained stable.  His chest tube has been free from air leak.  It has been transitioned to water seal.  His anterior chest tube was removed on POD #2.  The patient developed tremors with use of Reglan.  These resolved once reglan was discontinued.  He has been followed closely by Dr. Marin [name] for his Myelodysplastic syndrome.  His last WBC count was up to 8100, H and H up to 8.9 and 26.4, and platelet count 33,000. All chest tubes were removed by 10/16/2014. CXR this am showed stable appearance of the chest since the earlier study. There is no recurrent pneumothorax. Persistent left apical opacity with stable mildly increased interstitial markings predominantly on the left. A urine culture done on 09/26 showed 40,000 Enterococcus and was sensitive to Levaquin. As discussed with Dr. Prescott [name], will send home on one week of oral Levaquin. Also, he has been on Prednisone   Treatments: surgery:   1. Left VATS, resection of apical blebs. 2. Talc pleurodesis.  Disposition: 01-Home or Self Care   Discharge Medications:     Medication List    STOP taking these medications        Darbepoetin Alfa 500 MCG/ML Sosy injection  Commonly known as:  ARANESP     PRESCRIPTION MEDICATION     prochlorperazine 10 MG tablet  Commonly known as:  COMPAZINE      TAKE these medications        acetaminophen 500 MG tablet  Commonly known as:  TYLENOL  Take 500 mg by mouth every 6 (six) hours  as needed for fever.     ALPRAZolam 0.25 MG tablet  Commonly known as:  XANAX  Take 1 tablet (0.25 mg total) by mouth 3 (three) times daily as needed for anxiety.     aspirin EC 81 MG tablet  Take 1 tablet (81 mg total) by mouth daily.     diphenhydrAMINE 25 mg capsule  Commonly known as:  BENADRYL  Take 1 capsule (25 mg total) by mouth every 6 (six) hours as needed for itching. May take with Percocet PRN     feeding supplement (ENSURE ENLIVE) Liqd  Take 237 mLs by mouth 2 (two) times daily between meals.     levofloxacin 500 MG tablet  Commonly known as:  LEVAQUIN  Take 1 tablet (500 mg total) by mouth daily. For one week then stop     lidocaine-prilocaine cream  Commonly known as:  EMLA  Apply 1 application topically as needed. Please dispense 2 tubes     LORazepam 0.5 MG tablet  Commonly known as:  ATIVAN  Take 1 tablet (0.5 mg total) by mouth every 6 (six) hours as needed (Nausea or vomiting).     metoprolol tartrate 25 MG tablet  Commonly known as:  LOPRESSOR  Take 0.5 tablets (12.5 mg total) by mouth 2 (two) times daily.     nitroGLYCERIN 0.4 MG SL tablet  Commonly known as:  NITROSTAT  Place 1 tablet (0.4 mg total) under the tongue every 5 (five) minutes as needed for chest pain.     ondansetron 4 MG disintegrating tablet  Commonly known as:  ZOFRAN ODT  Take 1 tablet (4 mg total) by mouth every 8 (eight) hours as needed for nausea or vomiting.     oxyCODONE-acetaminophen 7.5-325 MG tablet  Commonly known as:  PERCOCET  Take 1 tablet by mouth every 4 (four) hours as needed for severe pain.     pantoprazole 40 MG tablet  Commonly known as:  PROTONIX  Take 40 mg by mouth at bedtime.     predniSONE 20 MG tablet  Commonly known as:  DELTASONE  Take 0.5 tablets (10 mg total) by mouth daily as needed (For chemo prophylaxis.). Prednisone 20 mg by mouth daily for 3 days;prednisone 10 mg by mouth daily for 3 days;then stop     REFRESH TEARS 0.5 % Soln  Generic drug:   carboxymethylcellulose  Place 2 drops into both eyes 3 (three) times daily as needed (For dry eyes.).     simvastatin 20 MG tablet  Commonly known as:  ZOCOR  Take 1 tablet (20 mg total) by mouth at bedtime.     temazepam 15 MG capsule  Commonly known as:  RESTORIL  Take 15 mg by mouth at bedtime as needed for sleep.     testosterone cypionate 100 MG/ML injection  Commonly known as:  DEPOTESTOTERONE CYPIONATE  Inject 400 mg into the muscle every 28 (twenty-eight) days. For IM use only     thiamine 100 MG tablet  Take 1 tablet (100 mg total) by mouth daily.       Follow-up Information    Follow up with Ivin Poot III, MD On 11/02/2014.   Specialty:  Cardiothoracic Surgery   Why:  Appointment is at 12:00   Contact information:   [address] [place] [address] [phone]       Follow up with [facility] On 11/02/2014.   Why:  Please get CXR at 11:30   Contact information:   [facility]       Follow up with Volanda Napoleon, MD.   Specialty:  Oncology   Why:  Call for a follow up appointment and Dr. Marin [name] will determine when to start chemo and iron injections   Contact information:   [address] [place] [address] [phone]       Signed: Nani Skillern PA-C 10/17/2014, 12:45 PM

## 2014-10-14 NOTE — Progress Notes (Signed)
Nutrition Follow Up  DOCUMENTATION CODES:   Not applicable  INTERVENTION:   Ensure Enlive po BID (Strawberry flavor), each supplement provides 350 kcal and 20 grams of protein  NUTRITION DIAGNOSIS:   Increased nutrient needs related to  (post op healing) as evidenced by estimated needs, ongoing  GOAL:   Patient will meet greater than or equal to 90% of their needs, not met  MONITOR:   PO intake, Supplement acceptance, Labs, Weight trends, I & O's  ASSESSMENT:    69 y.o. Male with a PMHx of CAD and MI s/p stent, HTN, HLD, AAA, diverticulitis, anemia, myelodysplastic syndrome, and recent spontaneous pneumothorax on 8/31 requiring chest tube insertion, who presents to the ED with complaints of gradual onset shortness breath 2 days.   Patient s/p procedure 9/25: PLACEMENT OF LEFT CHEST TUBE  Patient s/p procedure 9/27: VIDEO ASSISTED THORACOSCOPY (Left)  RD spoke to patient's wife at bedside.  Reports pt is not eating well.  PO intake variable at 30-50% per flowsheet records.  Drinks [employer] at home.  RD ordered prior to surgery.  Will re-order at this time.  Diet Order:  Diet full liquid Room service appropriate?: Yes; Fluid consistency:: Thin  Skin:  Reviewed, no issues  Last BM:  9/30  Height:   Ht Readings from Last 1 Encounters:  10/09/14 $RemoveB'5\' 10"'TLOkslTh$  (1.778 m)    Weight:   Wt Readings from Last 1 Encounters:  10/14/14 198 lb 3.1 oz (89.9 kg)   Wt Readings from Last 20 Encounters:  10/14/14 198 lb 3.1 oz (89.9 kg)  10/04/14 181 lb (82.101 kg)  10/03/14 181 lb (82.101 kg)  09/14/14 194 lb 0.1 oz (88 kg)  09/02/14 195 lb (88.451 kg)  08/15/14 194 lb (87.998 kg)  08/09/14 185 lb (83.915 kg)  07/04/14 191 lb (86.637 kg)  06/10/14 193 lb (87.544 kg)  06/06/14 193 lb (87.544 kg)  05/27/14 193 lb (87.544 kg)  05/09/14 194 lb (87.998 kg)  04/06/14 195 lb (88.451 kg)  03/30/14 197 lb (89.359 kg)  03/30/14 200 lb 6.4 oz (90.901 kg)  01/21/14 191 lb 6.4 oz  (86.818 kg)  11/22/13 184 lb 14.4 oz (83.87 kg)  10/15/13 168 lb (76.204 kg)  08/23/13 168 lb (76.204 kg)  08/06/13 168 lb 4.8 oz (76.34 kg)    Ideal Body Weight:  75.4 kg  BMI:  Body mass index is 28.44 kg/(m^2).  Estimated Nutritional Needs:   Kcal:  2000-2200  Protein:  100-110 gm  Fluid:  2.0-2.2 L  EDUCATION NEEDS:   No education needs identified at this time  Arthur Holms, RD, LDN Pager #: [phone] After-Hours Pager #: [phone]

## 2014-10-15 ENCOUNTER — Inpatient Hospital Stay (HOSPITAL_COMMUNITY): Payer: Medicare Other

## 2014-10-15 DIAGNOSIS — K2970 Gastritis, unspecified, without bleeding: Secondary | ICD-10-CM

## 2014-10-15 DIAGNOSIS — K2210 Ulcer of esophagus without bleeding: Secondary | ICD-10-CM

## 2014-10-15 HISTORY — DX: Ulcer of esophagus without bleeding: K22.10

## 2014-10-15 HISTORY — DX: Gastritis, unspecified, without bleeding: K29.70

## 2014-10-15 LAB — CBC WITH DIFFERENTIAL/PLATELET
BASOS ABS: 0 10*3/uL (ref 0.0–0.1)
Basophils Relative: 0 %
EOS ABS: 0 10*3/uL (ref 0.0–0.7)
Eosinophils Relative: 0 %
HEMATOCRIT: 25 % — AB (ref 39.0–52.0)
HEMOGLOBIN: 8.5 g/dL — AB (ref 13.0–17.0)
LYMPHS PCT: 2 %
Lymphs Abs: 0.1 10*3/uL — ABNORMAL LOW (ref 0.7–4.0)
MCH: 28.4 pg (ref 26.0–34.0)
MCHC: 34 g/dL (ref 30.0–36.0)
MCV: 83.6 fL (ref 78.0–100.0)
MONOS PCT: 9 %
Monocytes Absolute: 0.3 10*3/uL (ref 0.1–1.0)
NEUTROS PCT: 89 %
Neutro Abs: 3.1 10*3/uL (ref 1.7–7.7)
Platelets: 31 10*3/uL — ABNORMAL LOW (ref 150–400)
RBC: 2.99 MIL/uL — AB (ref 4.22–5.81)
RDW: 15.1 % (ref 11.5–15.5)
WBC: 3.5 10*3/uL — AB (ref 4.0–10.5)

## 2014-10-15 LAB — COMPREHENSIVE METABOLIC PANEL
ALBUMIN: 2.3 g/dL — AB (ref 3.5–5.0)
ALK PHOS: 50 U/L (ref 38–126)
ALT: 14 U/L — ABNORMAL LOW (ref 17–63)
ANION GAP: 7 (ref 5–15)
AST: 14 U/L — AB (ref 15–41)
BILIRUBIN TOTAL: 0.9 mg/dL (ref 0.3–1.2)
BUN: 18 mg/dL (ref 6–20)
CALCIUM: 7.9 mg/dL — AB (ref 8.9–10.3)
CO2: 25 mmol/L (ref 22–32)
Chloride: 101 mmol/L (ref 101–111)
Creatinine, Ser: 1.33 mg/dL — ABNORMAL HIGH (ref 0.61–1.24)
GFR calc Af Amer: >60 mL/min (ref >60)
GFR calc non Af Amer: 54 mL/min — ABNORMAL LOW (ref >60)
GLUCOSE: 167 mg/dL — AB (ref 65–99)
POTASSIUM: 4 mmol/L (ref 3.5–5.1)
SODIUM: 133 mmol/L — AB (ref 135–145)
TOTAL PROTEIN: 6 g/dL — AB (ref 6.5–8.1)

## 2014-10-15 LAB — GLUCOSE, CAPILLARY: Glucose-Capillary: 158 mg/dL — ABNORMAL HIGH (ref 65–99)

## 2014-10-15 LAB — RETICULOCYTES
RBC.: 2.99 MIL/uL — ABNORMAL LOW (ref 4.22–5.81)
Retic Ct Pct: <0.4 % — ABNORMAL LOW (ref 0.4–3.1)

## 2014-10-15 MED ORDER — HEPARIN SOD (PORK) LOCK FLUSH 100 UNIT/ML IV SOLN
500.0000 [IU] | INTRAVENOUS | Status: DC
  Administered 2014-10-15: 500 [IU]
  Filled 2014-10-15: qty 5

## 2014-10-15 MED ORDER — HEPARIN SOD (PORK) LOCK FLUSH 100 UNIT/ML IV SOLN
500.0000 [IU] | INTRAVENOUS | Status: DC | PRN
  Administered 2014-10-15: 500 [IU]
  Filled 2014-10-15: qty 5

## 2014-10-15 MED ORDER — SODIUM CHLORIDE 0.9 % IJ SOLN
10.0000 mL | INTRAMUSCULAR | Status: DC | PRN
  Administered 2014-10-15 – 2014-10-17 (×2): 10 mL
  Filled 2014-10-15: qty 40

## 2014-10-15 MED ORDER — SODIUM CHLORIDE 0.9 % IJ SOLN
10.0000 mL | Freq: Two times a day (BID) | INTRAMUSCULAR | Status: DC
  Administered 2014-10-15 – 2014-10-16 (×2): 10 mL

## 2014-10-15 NOTE — Progress Notes (Addendum)
      [address]       [place] [address]             [phone]        CARDIOTHORACIC SURGERY PROGRESS NOTE   R4 Days Post-Op Procedure(s) (LRB): VIDEO ASSISTED THORACOSCOPY (Left) STAPLING OF BLEBS (Left)  Subjective: Feels well.  Denies pain, SOB.  Wants to go home  Objective: Vital signs: BP Readings from Last 1 Encounters:  10/15/14 104/68   Pulse Readings from Last 1 Encounters:  10/15/14 62   Resp Readings from Last 1 Encounters:  10/15/14 12   Temp Readings from Last 1 Encounters:  10/15/14 97.4 F (36.3 C) Oral    Hemodynamics:    Physical Exam:  Rhythm:   sinus  Breath sounds: clear  Heart sounds:  RRR  Incisions:  Dressing dry, intact  Abdomen:  Soft, non-distended, non-tender  Extremities:  Warm, well-perfused  Chest tubes:  Low volume thin serosanguinous output, no air leak    Intake/Output from previous day: 09/30 0701 - 10/01 0700 In: 1000 [IV Piggyback:1000] Out: 1925 [Urine:1925] Intake/Output this shift: Total I/O In: -  Out: 12 [Chest Tube:12]  Lab Results:  CBC: Recent Labs  10/14/14 0411 10/15/14 0340  WBC 4.3 3.5*  HGB 8.7* 8.5*  HCT 24.8* 25.0*  PLT 45* 31*    BMET:  Recent Labs  10/14/14 0411 10/15/14 0340  NA 134* 133*  K 3.6 4.0  CL 101 101  CO2 27 25  GLUCOSE 118* 167*  BUN 15 18  CREATININE 1.35* 1.33*  CALCIUM 7.6* 7.9*     PT/INR:  No results for input(s): LABPROT, INR in the last 72 hours.  CBG (last 3)  No results for input(s): GLUCAP in the last 72 hours.  ABG    Component Value Date/Time   PHART 7.409 10/12/2014 0350   PCO2ART 34.1* 10/12/2014 0350   PO2ART 86.6 10/12/2014 0350   HCO3 21.2 10/12/2014 0350   TCO2 23 10/13/2014 2046   ACIDBASEDEF 2.8* 10/12/2014 0350   O2SAT 97.3 10/12/2014 0350    CXR: PORTABLE CHEST 1 VIEW  COMPARISON: Radiograph 10/14/2014  FINDINGS: Interval removal of 1 of 2 LEFT chest tubes. Small LEFT apical pneumothorax is noted less  than 10% volume. Small anterior pneumothorax noted. Subpulmonic pneumothorax is noted at the LEFT lung base. There is edema within the LEFT upper lobe.  LEFT central venous line and RIGHT power port unchanged. RIGHT lung is clear.  IMPRESSION: 1. Small RIGHT apical, anterior and subpulmonic pneumothorax noted following LEFT upper chest tube removal. 2. Edema within the upper lobe. 3. Lower LEFT chest tube remains in place. 4. Small effusion. Findings conveyed toICU nurse Mickel Baas on 10/15/2014 at07:56.   Electronically Signed  By: Suzy Bouchard M.D.  On: 10/15/2014 08:00  Assessment/Plan: S/P Procedure(s) (LRB): VIDEO ASSISTED THORACOSCOPY (Left) STAPLING OF BLEBS (Left)  Overall stable POD4 No air leak and CXR stable - will place tube to water seal Hgb stable, platelet count down to 31k Renal function stable Still waiting for bed on stepdown unit for transfer  Rexene Alberts, MD 10/15/2014 10:49 AM

## 2014-10-15 NOTE — Progress Notes (Signed)
[facility] BRIEF SICU PROGRESS NOTE  4 Days Post-Op  S/P Procedure(s) (LRB): VIDEO ASSISTED THORACOSCOPY (Left) STAPLING OF BLEBS (Left)   Stable day  Plan: Patient refuses Marinol - will d/c Otherwise continue current plan  Rexene Alberts, MD 10/15/2014 6:35 PM

## 2014-10-16 ENCOUNTER — Inpatient Hospital Stay (HOSPITAL_COMMUNITY): Payer: Medicare Other

## 2014-10-16 ENCOUNTER — Encounter: Payer: Self-pay | Admitting: Hematology & Oncology

## 2014-10-16 LAB — CBC WITH DIFFERENTIAL/PLATELET
Band Neutrophils: 0 %
Basophils Absolute: 0 10*3/uL (ref 0.0–0.1)
Basophils Relative: 0 %
Blasts: 0 %
Eosinophils Absolute: 0 10*3/uL (ref 0.0–0.7)
Eosinophils Relative: 0 %
HCT: 24.3 % — ABNORMAL LOW (ref 39.0–52.0)
Hemoglobin: 8.4 g/dL — ABNORMAL LOW (ref 13.0–17.0)
Lymphocytes Relative: 3 %
Lymphs Abs: 0.2 10*3/uL — ABNORMAL LOW (ref 0.7–4.0)
MCH: 29 pg (ref 26.0–34.0)
MCHC: 34.6 g/dL (ref 30.0–36.0)
MCV: 83.8 fL (ref 78.0–100.0)
Metamyelocytes Relative: 0 %
Monocytes Absolute: 0.6 10*3/uL (ref 0.1–1.0)
Monocytes Relative: 9 %
Myelocytes: 0 %
Neutro Abs: 5.6 10*3/uL (ref 1.7–7.7)
Neutrophils Relative %: 88 %
Other: 0 %
Platelets: 35 10*3/uL — ABNORMAL LOW (ref 150–400)
Promyelocytes Absolute: 0 %
RBC: 2.9 MIL/uL — ABNORMAL LOW (ref 4.22–5.81)
RDW: 15.2 % (ref 11.5–15.5)
WBC: 6.4 10*3/uL (ref 4.0–10.5)
nRBC: 0 /100 WBC

## 2014-10-16 LAB — COMPREHENSIVE METABOLIC PANEL
ALBUMIN: 1.9 g/dL — AB (ref 3.5–5.0)
ALK PHOS: 84 U/L (ref 38–126)
ALT: 20 U/L (ref 17–63)
ALT: 14 U/L — ABNORMAL LOW (ref 17–63)
ANION GAP: 8 (ref 5–15)
AST: 25 U/L (ref 15–41)
AST: 16 U/L (ref 15–41)
Albumin: 2.2 g/dL — ABNORMAL LOW (ref 3.5–5.0)
Alkaline Phosphatase: 48 U/L (ref 38–126)
Anion gap: 8 (ref 5–15)
BUN: 10 mg/dL (ref 6–20)
BUN: 19 mg/dL (ref 6–20)
CALCIUM: 8 mg/dL — AB (ref 8.9–10.3)
CO2: 29 mmol/L (ref 22–32)
CO2: 23 mmol/L (ref 22–32)
Calcium: 7.8 mg/dL — ABNORMAL LOW (ref 8.9–10.3)
Chloride: 99 mmol/L — ABNORMAL LOW (ref 101–111)
Chloride: 101 mmol/L (ref 101–111)
Creatinine, Ser: 1.3 mg/dL — ABNORMAL HIGH (ref 0.61–1.24)
Creatinine, Ser: 1.39 mg/dL — ABNORMAL HIGH (ref 0.61–1.24)
GFR calc Af Amer: >60 mL/min (ref >60)
GFR calc Af Amer: 59 mL/min — ABNORMAL LOW (ref >60)
GFR calc non Af Amer: 55 mL/min — ABNORMAL LOW (ref >60)
GFR calc non Af Amer: 51 mL/min — ABNORMAL LOW (ref >60)
GLUCOSE: 192 mg/dL — AB (ref 65–99)
Glucose, Bld: 143 mg/dL — ABNORMAL HIGH (ref 65–99)
POTASSIUM: 3.4 mmol/L — AB (ref 3.5–5.1)
Potassium: 3.1 mmol/L — ABNORMAL LOW (ref 3.5–5.1)
SODIUM: 136 mmol/L (ref 135–145)
Sodium: 132 mmol/L — ABNORMAL LOW (ref 135–145)
Total Bilirubin: 0.5 mg/dL (ref 0.3–1.2)
Total Bilirubin: 0.7 mg/dL (ref 0.3–1.2)
Total Protein: 5.7 g/dL — ABNORMAL LOW (ref 6.5–8.1)
Total Protein: 5.5 g/dL — ABNORMAL LOW (ref 6.5–8.1)

## 2014-10-16 LAB — GLUCOSE, CAPILLARY
Glucose-Capillary: 112 mg/dL — ABNORMAL HIGH (ref 65–99)
Glucose-Capillary: 143 mg/dL — ABNORMAL HIGH (ref 65–99)
Glucose-Capillary: 153 mg/dL — ABNORMAL HIGH (ref 65–99)
Glucose-Capillary: 88 mg/dL (ref 65–99)

## 2014-10-16 LAB — RETICULOCYTES
RBC.: 2.9 MIL/uL — ABNORMAL LOW (ref 4.22–5.81)
Retic Ct Pct: <0.4 % — ABNORMAL LOW (ref 0.4–3.1)

## 2014-10-16 NOTE — Progress Notes (Signed)
Patient transferred to room 2W18 with PT.  Ambulated 442 feet without difficulty or dyspnea.  Medicated for pain with Ultram prior to transfer.  Introduced to staff and oriented to unit routine.  Affect remains flat.  Patient states is anxious to be discharged home.  Wife to visit before supper and is very supportive.

## 2014-10-16 NOTE — Progress Notes (Signed)
Patient lying in bed, no distress or pain.  Wife at bedside, call light within reach.

## 2014-10-16 NOTE — Progress Notes (Addendum)
Physical Therapy Treatment Patient Details Name: [name] MRN: [id number] DOB: 07/30/1945 Today's Date: 10/16/2014    History of Present Illness 69 y.o. male admitted  for left tension pneumothorax (admitted for same 09/14/14); Lt VATS 9/27; onset resting tremor (extremities and head) 9/29 ? due to drug side effect.  PMHx -LE DVT, s/p IVC filter 09/18/14, HTN, AAA, tobacco abuse (recently quit "cold [place]", normocytic anemia, MDS (myelodysplastic syndrome), MI, depression, cervical fusion, s/p chemotherapy.     PT Comments    Pt progressing well with mobility, tremor less noticeable during ambulation, ambulated 442' with RW and min-guard A with one LOB requiring min A to correct. Decreased pace, especially with changes in direction. Continue to recommend outpt PT for balance and strengthening.    Follow Up Recommendations  Outpatient PT     Equipment Recommendations  Rolling walker (equipment rec changed based on balance deficits)   Recommendations for Other Services OT consult     Precautions / Restrictions Precautions Precautions: Fall Precaution Comments: CT's d/c'ed Restrictions Weight Bearing Restrictions: No    Mobility  Bed Mobility Overal bed mobility: Needs Assistance Bed Mobility: Supine to Sit     Supine to sit: Min assist     General bed mobility comments: min hand held assist, pt requests hand, encouraged him to try with rail but HHA least painful to chest tube sites  Transfers Overall transfer level: Needs assistance Equipment used: Rolling walker (2 wheeled) Transfers: Sit to/from Stand Sit to Stand: Min guard         General transfer comment: pushed from bed without cueing, no physical assist required  Ambulation/Gait Ambulation/Gait assistance: Min guard;Min assist Ambulation Distance (Feet): 442 Feet Assistive device: Rolling walker (2 wheeled) Gait Pattern/deviations: Step-through pattern;Staggering left Gait velocity: decreased Gait  velocity interpretation: Below normal speed for age/gender General Gait Details: tremors have improved considerably, especially with exertion. Pt ambulated from room on 2S to 2W, was encouraged to be able to ambulate full distance without stopping. 1 LOB with min A to correct.    Stairs            Wheelchair Mobility    Modified Rankin (Stroke Patients Only)       Balance Overall balance assessment: Needs assistance Sitting-balance support: Feet supported;No upper extremity supported Sitting balance-Leahy Scale: Fair     Standing balance support: No upper extremity supported Standing balance-Leahy Scale: Fair Standing balance comment: pt can maintain static ctance without support but unsafe with dynamic activity without support, decreased balance reactions and proprioception                    Cognition Arousal/Alertness: Awake/alert Behavior During Therapy: Flat affect Overall Cognitive Status: Within Functional Limits for tasks assessed                      Exercises      General Comments General comments (skin integrity, edema, etc.): HR 82 bpm, O2 sats 96% after ambulation      Pertinent Vitals/Pain Pain Assessment: Faces Faces Pain Scale: Hurts little more Pain Location: chest tube sites Pain Descriptors / Indicators: Sore Pain Intervention(s): Limited activity within patient's tolerance;Monitored during session;Premedicated before session    Home Living                      Prior Function            PT Goals (current goals can now be found in the care  plan section) Acute Rehab PT Goals Patient Stated Goal: stop tremors and go home PT Goal Formulation: With patient/family Time For Goal Achievement: 10/19/14 Potential to Achieve Goals: Good Progress towards PT goals: Progressing toward goals    Frequency  Min 3X/week    PT Plan Current plan remains appropriate  Update equipment recommendations    Co-evaluation              End of Session Equipment Utilized During Treatment: Gait belt Activity Tolerance: Patient tolerated treatment well Patient left: in bed;with call bell/phone within reach;with nursing/sitter in room     Time: 3662-9476 PT Time Calculation (min) (ACUTE ONLY): 28 min  Charges:  $Gait Training: 23-37 mins                    G Codes:     Leighton Roach, PT  Acute Rehab Services  [phone]  Leighton Roach 10/16/2014, 3:49 PM

## 2014-10-16 NOTE — Progress Notes (Signed)
Pt arrived on unit. CCMD notified. Pt resting comfortably in bed. Call bell within reach. No complaints at this time. Will continue to monitor.

## 2014-10-16 NOTE — Progress Notes (Signed)
Patient and wife informed of transfer to room 2W18.  Report called to receiving nurse and patient prepared for transfer.

## 2014-10-16 NOTE — Progress Notes (Signed)
      [address]       [place] [address]             [phone]        CARDIOTHORACIC SURGERY PROGRESS NOTE   R5 Days Post-Op Procedure(s) (LRB): VIDEO ASSISTED THORACOSCOPY (Left) STAPLING OF BLEBS (Left)  Subjective: Feels well  Objective: Vital signs: BP Readings from Last 1 Encounters:  10/16/14 119/65   Pulse Readings from Last 1 Encounters:  10/16/14 74   Resp Readings from Last 1 Encounters:  10/16/14 14   Temp Readings from Last 1 Encounters:  10/16/14 98.2 F (36.8 C) Oral    Hemodynamics:    Physical Exam:  Rhythm:   sinus  Breath sounds: clear  Heart sounds:  RRR  Incisions:  Dressings intact  Abdomen:  Soft, non-distended, non-tender  Extremities:  Warm, well-perfused  Chest tubes:  Low volume thin serosanguinous output, no air leak    Intake/Output from previous day: 10/01 0701 - 10/02 0700 In: 1860 [P.O.:360; IV Piggyback:1500] Out: 1910 [Urine:1800; Chest Tube:110] Intake/Output this shift: Total I/O In: 355 [P.O.:120; I.V.:13; Other:222] Out: 525 [Urine:525]  Lab Results:  CBC: Recent Labs  10/15/14 0340 10/16/14 0735  WBC 3.5* 6.4  HGB 8.5* 8.4*  HCT 25.0* 24.3*  PLT 31* 35*    BMET:  Recent Labs  10/16/14 0612 10/16/14 0735  NA 136 132*  K 3.4* 3.1*  CL 99* 101  CO2 29 23  GLUCOSE 192* 143*  BUN 10 19  CREATININE 1.30* 1.39*  CALCIUM 8.0* 7.8*     PT/INR:  No results for input(s): LABPROT, INR in the last 72 hours.  CBG (last 3)   Recent Labs  10/16/14 0020 10/16/14 0436 10/16/14 0807  GLUCAP 153* 112* 143*    ABG    Component Value Date/Time   PHART 7.409 10/12/2014 0350   PCO2ART 34.1* 10/12/2014 0350   PO2ART 86.6 10/12/2014 0350   HCO3 21.2 10/12/2014 0350   TCO2 23 10/13/2014 2046   ACIDBASEDEF 2.8* 10/12/2014 0350   O2SAT 97.3 10/12/2014 0350    CXR: PORTABLE CHEST 1 VIEW  COMPARISON: 10/15/2014  FINDINGS: Probable small left apical pneumothorax is unchanged.  Left chest tube remains in place. Small left effusion is unchanged. Left upper lobe density may represent pleural fluid following left pleurodesis.  Right jugular Port-A-Cath tip in the SVC unchanged. Left jugular central venous catheter tip in the SVC unchanged. Right lung remains clear.  IMPRESSION: Small left apical pneumothorax unchanged. Small left effusion. Left upper lobe density unchanged consistent with pleurodesis.   Electronically Signed  By: Franchot Gallo M.D.  On: 10/16/2014 07:41  Assessment/Plan: S/P Procedure(s) (LRB): VIDEO ASSISTED THORACOSCOPY (Left) STAPLING OF BLEBS (Left)  Doing well D/C chest tube Mobilize Transfer  Rexene Alberts, MD 10/16/2014 10:56 AM

## 2014-10-17 ENCOUNTER — Ambulatory Visit: Payer: Medicare Other

## 2014-10-17 ENCOUNTER — Other Ambulatory Visit: Payer: Medicare Other

## 2014-10-17 ENCOUNTER — Encounter: Payer: Self-pay | Admitting: *Deleted

## 2014-10-17 ENCOUNTER — Other Ambulatory Visit: Payer: Self-pay | Admitting: Hematology & Oncology

## 2014-10-17 ENCOUNTER — Inpatient Hospital Stay (HOSPITAL_COMMUNITY): Payer: Medicare Other

## 2014-10-17 ENCOUNTER — Ambulatory Visit: Payer: Medicare Other | Admitting: Hematology & Oncology

## 2014-10-17 DIAGNOSIS — D46Z Other myelodysplastic syndromes: Secondary | ICD-10-CM

## 2014-10-17 LAB — CBC WITH DIFFERENTIAL/PLATELET
Basophils Absolute: 0 10*3/uL (ref 0.0–0.1)
Basophils Relative: 0 %
EOS PCT: 0 %
Eosinophils Absolute: 0 10*3/uL (ref 0.0–0.7)
HCT: 26.4 % — ABNORMAL LOW (ref 39.0–52.0)
Hemoglobin: 8.9 g/dL — ABNORMAL LOW (ref 13.0–17.0)
LYMPHS ABS: 0.9 10*3/uL (ref 0.7–4.0)
LYMPHS PCT: 11 %
MCH: 28.2 pg (ref 26.0–34.0)
MCHC: 33.7 g/dL (ref 30.0–36.0)
MCV: 83.5 fL (ref 78.0–100.0)
MONO ABS: 1.5 10*3/uL — AB (ref 0.1–1.0)
Monocytes Relative: 19 %
Neutro Abs: 5.7 10*3/uL (ref 1.7–7.7)
Neutrophils Relative %: 70 %
PLATELETS: 33 10*3/uL — AB (ref 150–400)
RBC: 3.16 MIL/uL — ABNORMAL LOW (ref 4.22–5.81)
RDW: 15 % (ref 11.5–15.5)
WBC: 8.1 10*3/uL (ref 4.0–10.5)

## 2014-10-17 LAB — RETICULOCYTES
RBC.: 3.16 MIL/uL — ABNORMAL LOW (ref 4.22–5.81)
Retic Ct Pct: <0.4 % — ABNORMAL LOW (ref 0.4–3.1)

## 2014-10-17 MED ORDER — THIAMINE HCL 100 MG PO TABS: 100.0000 mg | ORAL_TABLET | Freq: Every day | ORAL | Status: DC

## 2014-10-17 MED ORDER — ONDANSETRON 4 MG PO TBDP: 4.0000 mg | ORAL_TABLET | Freq: Three times a day (TID) | ORAL | Status: DC | PRN

## 2014-10-17 MED ORDER — ENSURE ENLIVE PO LIQD: 237.0000 mL | Freq: Two times a day (BID) | ORAL | Status: DC

## 2014-10-17 MED ORDER — LEVOFLOXACIN 500 MG PO TABS: 500.0000 mg | ORAL_TABLET | Freq: Every day | ORAL | Status: DC

## 2014-10-17 MED ORDER — DIPHENHYDRAMINE HCL 25 MG PO CAPS: 25.0000 mg | ORAL_CAPSULE | Freq: Four times a day (QID) | ORAL | Status: AC | PRN

## 2014-10-17 MED ORDER — PREDNISONE 20 MG PO TABS: 10.0000 mg | ORAL_TABLET | Freq: Every day | ORAL | Status: DC | PRN

## 2014-10-17 MED ORDER — OXYCODONE-ACETAMINOPHEN 7.5-325 MG PO TABS: 1.0000 | ORAL_TABLET | ORAL | Status: DC | PRN

## 2014-10-17 NOTE — Progress Notes (Signed)
PT Cancellation Note  Patient Details Name: [name] MRN: [id number] DOB: 1945/08/25   Cancelled Treatment:    Reason Eval/Treat Not Completed: Patient at procedure or test/unavailable. With IV team and will be on bedrest for 30 minutes. Will see after this for training with RW and stair training (3 steps to enter, no rails).   Lajoya Dombek 10/17/2014, 11:06 AM  Pager [phone]

## 2014-10-17 NOTE — Progress Notes (Signed)
Nursing note  Pt and wife given discharge instruction medication list follow up appointments, and paper prescriptions given. All questions answered will discharge home as ordered. Sonna Lipsky, Bettina Gavia RN

## 2014-10-17 NOTE — Progress Notes (Signed)
Per MD order, central line removed. IV cathter intact. Vaseline pressure gauze to site, pressure held x 5 min, no bleeding to site. Pt instructed not to get out of bed for 30 min after the removal of the central line. Instucted to keep dressing CDI x 24hours, if bleeding occurs hold pressure, if bleeding does not stop contact MD or go to the ED. Pt verbalized understanding and did not have any questions. [name] M  

## 2014-10-17 NOTE — Care Management Note (Signed)
Case Management Note  Patient Details  Name: [name] MRN: [id number] Date of Birth: April 23, 1945  Subjective/Objective:    Pt admitted with spontaneous pnemothorax                Action/Plan:  Pt is independent from home with wife.     Expected Discharge Date:                  Expected Discharge Plan:  Home/Self Care  In-House Referral:     Discharge planning Services  CM Consult  Post Acute Care Choice:    Choice offered to:     DME Arranged:   RW DME Agency:   [facility]  HH Arranged:   PT [place] Agency:   [facility]  Status of Service: Complete, will sign off  Medicare Important Message Given:  Yes-third notification given Date Medicare IM Given:    Medicare IM give by:    Date Additional Medicare IM Given:    Additional Medicare Important Message give by:     If discussed at [place] of Stay Meetings, dates discussed:    Additional Comments: CM assessed pt.  CM offered choice, pt and wife chose [facility] for both HH and DME, CM contacted agency and referral was accepted.   Maryclare Labrador, RN 10/17/2014, 2:27 PM

## 2014-10-17 NOTE — Progress Notes (Addendum)
      [place] [address] [address]       [employer] [address]             [phone]       6 Days Post-Op Procedure(s) (LRB): VIDEO ASSISTED THORACOSCOPY (Left) STAPLING OF BLEBS (Left)  Subjective: Patient with some incisional pain this am  Objective: Vital signs in last 24 hours: Temp:  [98.1 F (36.7 C)-100 F (37.8 C)] 99.6 F (37.6 C) (10/03 0559) Pulse Rate:  [61-87] 84 (10/03 0445) Cardiac Rhythm:  [-] Normal sinus rhythm (10/02 1906) Resp:  [12-20] 20 (10/03 0445) BP: (106-149)/(58-88) 125/72 mmHg (10/03 0445) SpO2:  [90 %-100 %] 95 % (10/03 0445) Weight:  [175 lb 14.4 oz (79.788 kg)] 175 lb 14.4 oz (79.788 kg) (10/03 0500)    Intake/Output from previous day: 10/02 0701 - 10/03 0700 In: 855 [P.O.:120; I.V.:13; IV Piggyback:500] Out: 2405 [Urine:2375]   Physical Exam:  Cardiovascular: RRR Pulmonary: Slightly diminished at bases; no rales, wheezes, or rhonchi. Abdomen: Soft, non tender, bowel sounds present. Extremities: No lower extremity edema. Wounds: Clean and dry.  No erythema or signs of infection.   Lab Results: CBC: Recent Labs  10/15/14 0340 10/16/14 0735  WBC 3.5* 6.4  HGB 8.5* 8.4*  HCT 25.0* 24.3*  PLT 31* 35*   BMET:  Recent Labs  10/16/14 0612 10/16/14 0735  NA 136 132*  K 3.4* 3.1*  CL 99* 101  CO2 29 23  GLUCOSE 192* 143*  BUN 10 19  CREATININE 1.30* 1.39*  CALCIUM 8.0* 7.8*    PT/INR: No results for input(s): LABPROT, INR in the last 72 hours. ABG:  INR: Will add last result for INR, ABG once components are confirmed Will add last 4 CBG results once components are confirmed  Assessment/Plan:  1. CV - SR in the 90's. 2.  Pulmonary - On room air.CXR this am appears to show no pneumothorax. Encourage incentive spirometer 3. Remove central line after infusion done 4. Await official interpretation of CXR. Possibly home later today or in am  Amontae Ng MPA-C 10/17/2014,7:30 AM

## 2014-10-17 NOTE — Care Management Important Message (Signed)
Important Message  Patient Details  Name: [name] MRN: [id number] Date of Birth: 1945-03-31   Medicare Important Message Given:  Yes-third notification given    Delorse Lek 10/17/2014, 1:00 PM

## 2014-10-17 NOTE — Progress Notes (Signed)
Physical Therapy Treatment Patient Details Name: [name] MRN: [id number] DOB: 09/14/1945 Today's Date: 10/17/2014    History of Present Illness 69 y.o. male admitted  for left tension pneumothorax (admitted for same 09/14/14); Lt VATS 9/27; onset resting tremor (extremities and head) 9/29 ? due to drug side effect.  PMHx -LE DVT, s/p IVC filter 09/18/14, HTN, AAA, tobacco abuse (recently quit "cold [place]", normocytic anemia, MDS (myelodysplastic syndrome), MI, depression, cervical fusion, s/p chemotherapy.     PT Comments    Patient's tremors have diminished, however still present to the point he does not feel safe/ready to try walking without RW. Discussed options re: HHPT vs OPPT and agreed that HHPT makes sense with using a new device (RW) and completing home safety evaluation. Discussed he may still need PT for balance training to help him progress to walking independently. On return to room, wife unsure she wanted pt to have a RW for home. Reiterated above issues and PATIENT feels he needs the RW currently. Discussed HHPT and she was concerned re: copay, but agreed when I told her that Case Manager (or [place]) will know if he has a co-pay and will tell them prior to 1st visit.   *Note-RN requested I return to room as pt/wife were now telling Case Manager they don't want HHPT. Joined conversation and had the same conversation again with them ultimately agreeing to have HHPT for at least a safety evaluation.    Follow Up Recommendations  Home health PT     Equipment Recommendations  Rolling walker with 5" wheels    Recommendations for Other Services       Precautions / Restrictions Precautions Precautions: Fall Restrictions Weight Bearing Restrictions: No    Mobility  Bed Mobility Overal bed mobility: Modified Independent Bed Mobility: Supine to Sit;Sit to Supine     Supine to sit: Modified independent (Device/Increase time) Sit to supine: Independent   General bed  mobility comments: HOB at 20, no use of rail; used legs to generate momentum to come to sit  Transfers Overall transfer level: Needs assistance Equipment used: Rolling walker (2 wheeled) Transfers: Sit to/from Stand Sit to Stand: Min guard         General transfer comment: pulled on RW despite cues to push off surface to incr safety; no physical assist required  Ambulation/Gait Ambulation/Gait assistance: Min guard Ambulation Distance (Feet): 350 Feet Assistive device: Rolling walker (2 wheeled) Gait Pattern/deviations: Step-through pattern;Trunk flexed Gait velocity: decreased   General Gait Details: tremors have improved considerably; multiple turns negotiated without LOB   Stairs Stairs: Yes Stairs assistance: Min guard Stair Management: One rail Right;Step to pattern;Forwards Number of Stairs: 1 (x 6) General stair comments: step up/down single step with pt barely touching Rt rail and Lt HHA  Wheelchair Mobility    Modified Rankin (Stroke Patients Only)       Balance     Sitting balance-Leahy Scale: Fair       Standing balance-Leahy Scale: Fair Standing balance comment: pt can maintain static ctance without support but unsafe with dynamic activity without support, decreased balance reactions and proprioception                    Cognition Arousal/Alertness: Awake/alert Behavior During Therapy: Flat affect Overall Cognitive Status: Within Functional Limits for tasks assessed                      Exercises      General Comments  Pertinent Vitals/Pain Pain Assessment: 0-10 Pain Score: 4  Pain Location: incision Pain Descriptors / Indicators: Operative site guarding Pain Intervention(s): Limited activity within patient's tolerance;Monitored during session;Repositioned    Home Living                      Prior Function            PT Goals (current goals can now be found in the care plan section) Acute Rehab PT  Goals Patient Stated Goal: stop tremors and go home Time For Goal Achievement: 10/19/14 Progress towards PT goals: Progressing toward goals    Frequency  Min 3X/week    PT Plan Discharge plan needs to be updated    Co-evaluation             End of Session Equipment Utilized During Treatment: Gait belt Activity Tolerance: Patient tolerated treatment well Patient left: in bed;with call bell/phone within reach;with family/visitor present     Time: 3419-3790 PT Time Calculation (min) (ACUTE ONLY): 27 min  Charges:  $Gait Training: 23-37 mins                    G Codes:      Vickie Ponds 10/23/2014, 12:27 PM Pager [phone]

## 2014-10-17 NOTE — Progress Notes (Signed)
[name] looks quite good. His chest tubes are out. He is quite happy about this. Hopefully, he will be able to go home today. He still has the triple-lumen catheter in his left neck. Hopefully this can be removed. He has a Port-A-Cath that can certainly be utilized if necessary.  His blood counts are holding pretty steady. Results are not yet back. However, over the weekend, he is single and was a 0.4 and pleasant count 35,000. White cell count 6.4.  He is out of bed. He sees me eating a low bit better.  His physical exam shows stable vital signs. Temperature 99.6. Blood pressure 125/72. Head and neck exam shows no ocular or oral lesions. He has no obvious adenopathy in the neck. There is the catheter and the left neck. Lungs are clear. Cardiac exam regular rate and rhythm with no murmurs, rubs or bruits. Abdomen is soft. Bowel sounds are present. He has no fluid wave. There is no palpable liver or spleen tip. Extremities shows no clubbing, cyanosis or edema. Skin exam shows no rashes, ecchymosis or petechia.  Again, hopefully he will be able to go home today.  We will start his chemotherapy next week.  I appreciate all the great care that he is received up [address]..  [name] E.  Rodman Key 14:27

## 2014-10-18 ENCOUNTER — Ambulatory Visit: Payer: Medicare Other

## 2014-10-18 ENCOUNTER — Other Ambulatory Visit: Payer: Self-pay | Admitting: *Deleted

## 2014-10-18 DIAGNOSIS — D46Z Other myelodysplastic syndromes: Secondary | ICD-10-CM

## 2014-10-18 MED ORDER — ONDANSETRON HCL 8 MG PO TABS: 8.0000 mg | ORAL_TABLET | Freq: Three times a day (TID) | ORAL | Status: DC | PRN

## 2014-10-19 ENCOUNTER — Ambulatory Visit: Payer: Medicare Other

## 2014-10-19 ENCOUNTER — Ambulatory Visit (HOSPITAL_BASED_OUTPATIENT_CLINIC_OR_DEPARTMENT_OTHER): Payer: Medicare Other | Admitting: Hematology & Oncology

## 2014-10-19 ENCOUNTER — Other Ambulatory Visit (HOSPITAL_COMMUNITY)
Admission: RE | Admit: 2014-10-19 | Discharge: 2014-10-19 | Disposition: A | Discharge status: Home / Self Care | Payer: Medicare Other | Source: Ambulatory Visit | Attending: Hematology & Oncology | Admitting: Hematology & Oncology

## 2014-10-19 ENCOUNTER — Other Ambulatory Visit: Payer: Medicare Other | Admitting: Family

## 2014-10-19 ENCOUNTER — Other Ambulatory Visit: Payer: Medicare Other

## 2014-10-19 ENCOUNTER — Other Ambulatory Visit: Payer: Self-pay | Admitting: *Deleted

## 2014-10-19 ENCOUNTER — Encounter: Payer: Self-pay | Admitting: Hematology & Oncology

## 2014-10-19 ENCOUNTER — Ambulatory Visit (HOSPITAL_BASED_OUTPATIENT_CLINIC_OR_DEPARTMENT_OTHER): Payer: Medicare Other

## 2014-10-19 ENCOUNTER — Other Ambulatory Visit (HOSPITAL_BASED_OUTPATIENT_CLINIC_OR_DEPARTMENT_OTHER): Payer: Medicare Other

## 2014-10-19 VITALS — BP 139/70 | HR 100 | Temp 99.3°F | Resp 18

## 2014-10-19 DIAGNOSIS — D469 Myelodysplastic syndrome, unspecified: Secondary | ICD-10-CM | POA: Diagnosis present

## 2014-10-19 DIAGNOSIS — Z86718 Personal history of other venous thrombosis and embolism: Secondary | ICD-10-CM | POA: Diagnosis not present

## 2014-10-19 DIAGNOSIS — B029 Zoster without complications: Secondary | ICD-10-CM

## 2014-10-19 DIAGNOSIS — E291 Testicular hypofunction: Secondary | ICD-10-CM | POA: Diagnosis not present

## 2014-10-19 DIAGNOSIS — B370 Candidal stomatitis: Secondary | ICD-10-CM

## 2014-10-19 DIAGNOSIS — D46Z Other myelodysplastic syndromes: Secondary | ICD-10-CM

## 2014-10-19 DIAGNOSIS — J939 Pneumothorax, unspecified: Secondary | ICD-10-CM | POA: Diagnosis not present

## 2014-10-19 DIAGNOSIS — D61818 Other pancytopenia: Secondary | ICD-10-CM | POA: Diagnosis present

## 2014-10-19 DIAGNOSIS — R630 Anorexia: Secondary | ICD-10-CM

## 2014-10-19 LAB — COMPREHENSIVE METABOLIC PANEL
ALBUMIN: 2.9 g/dL — AB (ref 3.5–5.0)
ALT: 27 U/L (ref 17–63)
AST: 28 U/L (ref 15–41)
Alkaline Phosphatase: 77 U/L (ref 38–126)
Anion gap: 8 (ref 5–15)
BILIRUBIN TOTAL: 0.8 mg/dL (ref 0.3–1.2)
BUN: 32 mg/dL — AB (ref 6–20)
CO2: 21 mmol/L — ABNORMAL LOW (ref 22–32)
CREATININE: 1.6 mg/dL — AB (ref 0.61–1.24)
Calcium: 7.8 mg/dL — ABNORMAL LOW (ref 8.9–10.3)
Chloride: 104 mmol/L (ref 101–111)
GFR calc Af Amer: 50 mL/min — ABNORMAL LOW (ref >60)
GFR calc non Af Amer: 43 mL/min — ABNORMAL LOW (ref >60)
GLUCOSE: 173 mg/dL — AB (ref 65–99)
POTASSIUM: 2.6 mmol/L — AB (ref 3.5–5.1)
Sodium: 133 mmol/L — ABNORMAL LOW (ref 135–145)
TOTAL PROTEIN: 7.2 g/dL (ref 6.5–8.1)

## 2014-10-19 LAB — MANUAL DIFFERENTIAL (CHCC SATELLITE)
ALC: 1 10*3/uL (ref 0.9–3.3)
ANC (CHCC HP manual diff): 8.4 10*3/uL — ABNORMAL HIGH (ref 1.5–6.5)
BAND NEUTROPHILS: 3 % (ref 0–10)
LYMPH: 10 % — ABNORMAL LOW (ref 14–48)
MONO: 4 % (ref 0–13)
PLT EST ~~LOC~~: DECREASED
SEG: 83 % — AB (ref 40–75)

## 2014-10-19 LAB — CBC WITH DIFFERENTIAL (CANCER CENTER ONLY)
HEMATOCRIT: 27.6 % — AB (ref 38.7–49.9)
HGB: 9.4 g/dL — ABNORMAL LOW (ref 13.0–17.1)
MCH: 28.6 pg (ref 28.0–33.4)
MCHC: 34.1 g/dL (ref 32.0–35.9)
MCV: 84 fL (ref 82–98)
Platelets: 46 10*3/uL — ABNORMAL LOW (ref 145–400)
RBC: 3.29 10*6/uL — AB (ref 4.20–5.70)
RDW: 14.7 % (ref 11.1–15.7)
WBC: 9.7 10*3/uL (ref 4.0–10.0)

## 2014-10-19 LAB — HOLD TUBE, BLOOD BANK - CHCC SATELLITE

## 2014-10-19 MED ORDER — SODIUM CHLORIDE 0.9 % IV SOLN
Freq: Once | INTRAVENOUS | Status: AC
  Administered 2014-10-19: 16:00:00 via INTRAVENOUS
  Filled 2014-10-19: qty 4

## 2014-10-19 MED ORDER — FAMCICLOVIR 500 MG PO TABS: 500.0000 mg | ORAL_TABLET | Freq: Three times a day (TID) | ORAL | Status: DC

## 2014-10-19 MED ORDER — PROMETHAZINE HCL 25 MG/ML IJ SOLN: 12.5000 mg | Freq: Three times a day (TID) | INTRAMUSCULAR | Status: DC | PRN

## 2014-10-19 MED ORDER — PROCHLORPERAZINE EDISYLATE 5 MG/ML IJ SOLN: 5.0000 mg | Freq: Once | INTRAMUSCULAR | Status: DC

## 2014-10-19 MED ORDER — FLUCONAZOLE 100 MG PO TABS: 100.0000 mg | ORAL_TABLET | Freq: Every day | ORAL | Status: DC

## 2014-10-19 MED ORDER — POTASSIUM CHLORIDE 40 MEQ/15ML (20%) PO SOLN: 40.0000 meq | Freq: Two times a day (BID) | ORAL | Status: DC

## 2014-10-19 NOTE — Progress Notes (Signed)
Hematology and Oncology Follow Up Visit  [name] [id number] November 12, 1945 69 y.o. 10/19/2014   Principle Diagnosis:   RAEB-2 (normal cytogenetics with mutated ASXL-1, TET2, U2AF1 genes)  Herpes zoster of the right T10 dermatome  Recurrent spontaneous left pneumothorax-status post VATS repair  Hypotestosteronemia  Current Therapy:    Dose intense Vidaza-7 day cycles. To start today  Aranesp 500 g subcutaneous every week needed for hemoglobin less than 10     Interim History:  [name] is back for an unscheduled visit. He was just discharged from the hospital a few days ago. He had a second spontaneous pneumothorax on the left side. He required surgical intervention with a VATS procedure. He was in the hospital for about a week or so.  His wife says that he is just not doing well at home. He's not eating much. He has some tremors.  She sent a picture of his skin. It was obvious that he had shingles.  He came in. He has the herpetic rash in the right T10 dermatome.  He's not eating much. He just does not feel that well. He refuses to take Marinol. I cannot give him Megace because of his past history of thromboembolic disease.  His potassium came back at 2.6.  We gave him IV fluids in the office.  I told his wife that the shingles are only contagious if you've never had chickenpox. She thinks that she has had chickenpox.  He really has had a tough time lately. This second spontaneous pneumothorax really has had a bad effect on him. He just has been depressed. He really does not want anything for this either.  He is supposed to start treatment on October 10. We probably will have to push this back in another week or so. He is deciding as to whether or not he wants to take any treatment.  Overall, his performance status is ECOG 2.  Medications:  Current outpatient prescriptions:  .  acetaminophen (TYLENOL) 500 MG tablet, Take 500 mg by mouth every 6 (six) hours as  needed for fever., Disp: , Rfl:  .  ALPRAZolam (XANAX) 0.25 MG tablet, Take 1 tablet (0.25 mg total) by mouth 3 (three) times daily as needed for anxiety., Disp: 60 tablet, Rfl: 0 .  aspirin EC 81 MG tablet, Take 1 tablet (81 mg total) by mouth daily., Disp: , Rfl:  .  carboxymethylcellulose (REFRESH TEARS) 0.5 % SOLN, Place 2 drops into both eyes 3 (three) times daily as needed (For dry eyes.)., Disp: , Rfl:  .  diphenhydrAMINE (BENADRYL) 25 mg capsule, Take 1 capsule (25 mg total) by mouth every 6 (six) hours as needed for itching. May take with Percocet PRN, Disp: 30 capsule, Rfl: 0 .  famciclovir (FAMVIR) 500 MG tablet, Take 1 tablet (500 mg total) by mouth 3 (three) times daily., Disp: 30 tablet, Rfl: 0 .  feeding supplement, ENSURE ENLIVE, (ENSURE ENLIVE) LIQD, Take 237 mLs by mouth 2 (two) times daily between meals., Disp: 237 mL, Rfl: 12 .  fluconazole (DIFLUCAN) 100 MG tablet, Take 1 tablet (100 mg total) by mouth daily., Disp: 5 tablet, Rfl: 0 .  lidocaine-prilocaine (EMLA) cream, Apply 1 application topically as needed. Please dispense 2 tubes, Disp: 60 g, Rfl: 3 .  LORazepam (ATIVAN) 0.5 MG tablet, Take 1 tablet (0.5 mg total) by mouth every 6 (six) hours as needed (Nausea or vomiting)., Disp: 30 tablet, Rfl: 3 .  metoprolol tartrate (LOPRESSOR) 25 MG tablet, Take 0.5 tablets (12.5  mg total) by mouth 2 (two) times daily., Disp: 90 tablet, Rfl: 3 .  nitroGLYCERIN (NITROSTAT) 0.4 MG SL tablet, Place 1 tablet (0.4 mg total) under the tongue every 5 (five) minutes as needed for chest pain., Disp: 25 tablet, Rfl: 6 .  ondansetron (ZOFRAN ODT) 4 MG disintegrating tablet, Take 1 tablet (4 mg total) by mouth every 8 (eight) hours as needed for nausea or vomiting., Disp: 20 tablet, Rfl: 0 .  ondansetron (ZOFRAN) 8 MG tablet, Take 1 tablet (8 mg total) by mouth every 8 (eight) hours as needed for nausea or vomiting., Disp: 90 tablet, Rfl: 3 .  oxyCODONE-acetaminophen (PERCOCET) 7.5-325 MG tablet,  Take 1 tablet by mouth every 4 (four) hours as needed for severe pain., Disp: 20 tablet, Rfl: 0 .  pantoprazole (PROTONIX) 40 MG tablet, Take 40 mg by mouth at bedtime. , Disp: , Rfl:  .  Potassium Chloride 40 MEQ/15ML (20%) SOLN, Take 40 mEq by mouth 2 (two) times daily., Disp: 1 Bottle, Rfl: 2 .  simvastatin (ZOCOR) 20 MG tablet, Take 1 tablet (20 mg total) by mouth at bedtime., Disp: 30 tablet, Rfl: 5 .  temazepam (RESTORIL) 15 MG capsule, Take 15 mg by mouth at bedtime as needed for sleep., Disp: , Rfl:  .  testosterone cypionate (DEPOTESTOTERONE CYPIONATE) 100 MG/ML injection, Inject 400 mg into the muscle every 28 (twenty-eight) days. For IM use only, Disp: , Rfl:  .  thiamine 100 MG tablet, Take 1 tablet (100 mg total) by mouth daily., Disp: 30 tablet, Rfl: 0  Allergies:  Allergies  Allergen Reactions  . Albuterol-Ipratropium-Soybean Lecithin [Ipratropium-Albuterol] Anxiety  . Cephalexin Other (See Comments)    Nausea and stomach cramps  . Compazine [Prochlorperazine] Anxiety  . Percocet [Oxycodone-Acetaminophen] Itching    Can take with benadryl    . Phenergan [Promethazine Hcl] Other (See Comments)    "restless legs"  . Tramadol Other (See Comments)    "jerking limbs and talking in his sleep"  . Vicodin [Hydrocodone-Acetaminophen] Itching    Can take with benadryl   . Marinol [Dronabinol] Other (See Comments)    "Affects his eyes"  . Reglan [Metoclopramide] Other (See Comments)    Patient has significant shakes that are troubling. Would not like to receive this if there are other options available.    Past Medical History, Surgical history, Social history, and Family History were reviewed and updated.  Review of Systems: As above  Physical Exam:  oral temperature is 99.3 F (37.4 C). His blood pressure is 139/70 and his pulse is 100. His respiration is 18 and oxygen saturation is 98%.   Wt Readings from Last 3 Encounters:  10/17/14 175 lb 14.4 oz (79.788 kg)    10/04/14 181 lb (82.101 kg)  10/03/14 181 lb (82.101 kg)     Well-developed and well-nourished white gentleman in no obvious distress. Head and neck exam shows no ocular or oral lesions. There are no palpable cervical or supraclavicular lymph does. Lungs are clear. Cardiac exam regular rate and rhythm with no murmurs, rubs or bruits. Abdomen is soft. Has good bowel sounds. There is no fluid wave. There is no palpable liver or spleen tip. Back exam shows no tenderness over the spine, ribs or hips. Extremities shows no clubbing, cyanosis or edema. Skin exam shows the herpetic rash in the right T10 dermatome. He has some bleeding into the vesicles. Neurological exam is nonfocal.  Lab Results  Component Value Date   WBC 9.7 10/19/2014   HGB 9.4*  10/19/2014   HCT 27.6* 10/19/2014   MCV 84 10/19/2014   PLT 46* 10/19/2014     Chemistry      Component Value Date/Time   NA 132* 10/16/2014 0735   NA 135 10/03/2014 0855   NA 136 09/12/2014 1325   K 3.1* 10/16/2014 0735   K 3.6 10/03/2014 0855   K 3.7 09/12/2014 1325   CL 101 10/16/2014 0735   CL 102 10/03/2014 0855   CO2 23 10/16/2014 0735   CO2 25 10/03/2014 0855   CO2 21* 09/12/2014 1325   BUN 19 10/16/2014 0735   BUN 16 10/03/2014 0855   BUN 28.0* 09/12/2014 1325   CREATININE 1.39* 10/16/2014 0735   CREATININE 1.2 10/03/2014 0855   CREATININE 1.4* 09/12/2014 1325      Component Value Date/Time   CALCIUM 7.8* 10/16/2014 0735   CALCIUM 9.3 10/03/2014 0855   CALCIUM 8.9 09/12/2014 1325   ALKPHOS 48 10/16/2014 0735   ALKPHOS 72 10/03/2014 0855   ALKPHOS 79 09/12/2014 1325   AST 16 10/16/2014 0735   AST 23 10/03/2014 0855   AST 13 09/12/2014 1325   ALT 14* 10/16/2014 0735   ALT 25 10/03/2014 0855   ALT 33 09/12/2014 1325   BILITOT 0.7 10/16/2014 0735   BILITOT 0.90 10/03/2014 0855   BILITOT 0.71 09/12/2014 1325         Impression and Plan: [name] is 69 year old gentleman with myelodysplasia. He has normal  cytogenetics. We did do the NGS of his blood which showed some abnormalities. Again, these might indicate an increased risk of transformation but it is not definitive.  Again, he has had a really tough time with these pneumothoraces. He required surgery for the second one.  I believe that the shingles were caught early enough that he should not have postherpetic neuralgia. I will put on Famvir at 500 mg 3 times a day for 10 days.  I will also give him a little Diflucan. He does have a little bit of thrush. He will be on Diflucan for 5 days.  I'll put on 40 mEq twice a day of potassium.  I tried to encourage him to drink for right now. He may not be I'll add hungry. Hopefully once his potassium needs better, he will be able to have more of an appetite.  At least his blood counts were a little bit better area and some of this may be from dehydration.  He will come back on October 10. He has a regular appointment that day.   I spent a good hour with he and his wife today.   Volanda Napoleon, MD 10/5/20165:08 PM

## 2014-10-19 NOTE — Patient Instructions (Signed)
Shingles Shingles is an infection that causes a painful skin rash and fluid-filled blisters. Shingles is caused by the same virus that causes chickenpox. Shingles only develops in people who:  Have had chickenpox.  Have gotten the chickenpox vaccine. (This is rare.) The first symptoms of shingles may be itching, tingling, or pain in an area on your skin. A rash will follow in a few days or weeks. The rash is usually on one side of the body in a bandlike or beltlike pattern. Over time, the rash turns into fluid-filled blisters that break open, scab over, and dry up. Medicines may:  Help you manage pain.  Help you recover more quickly.  Help to prevent long-term problems. HOME CARE Medicines  Take medicines only as told by your doctor.  Apply an anti-itch or numbing cream to the affected area as told by your doctor. Blister and Rash Care  Take a cool bath or put cool compresses on the area of the rash or blisters as told by your doctor. This may help with pain and itching.  Keep your rash covered with a loose bandage (dressing). Wear loose-fitting clothing.  Keep your rash and blisters clean with mild soap and cool water or as told by your doctor.  Check your rash every day for signs of infection. These include redness, swelling, and pain that lasts or gets worse.  Do not pick your blisters.  Do not scratch your rash. General Instructions  Rest as told by your doctor.  Keep all follow-up visits as told by your doctor. This is important.  Until your blisters scab over, your infection can cause chickenpox in people who have never had it or been vaccinated against it. To prevent this from happening, avoid touching other people or being around other people, especially:  Babies.  Pregnant women.  Children who have eczema.  Elderly people who have transplants.  People who have chronic illnesses, such as leukemia or AIDS. GET HELP IF:  Your pain does not get better with  medicine.  Your pain does not get better after the rash heals.  Your rash looks infected. Signs of infection include:  Redness.  Swelling.  Pain that lasts or gets worse. GET HELP RIGHT AWAY IF:  The rash is on your face or nose.  You have pain in your face, pain around your eye area, or loss of feeling on one side of your face.  You have ear pain or you have ringing in your ear.  You have loss of taste.  Your condition gets worse.   This information is not intended to replace advice given to you by your health care provider. Make sure you discuss any questions you have with your health care provider.   Document Released: 06/19/2007 Document Revised: 01/21/2014 Document Reviewed: 10/12/2013 Elsevier Interactive Patient Education 2016 [employer]  

## 2014-10-20 ENCOUNTER — Telehealth: Payer: Self-pay | Admitting: *Deleted

## 2014-10-20 ENCOUNTER — Ambulatory Visit: Payer: Medicare Other

## 2014-10-20 ENCOUNTER — Encounter: Payer: Self-pay | Admitting: Hematology & Oncology

## 2014-10-20 DIAGNOSIS — D46Z Other myelodysplastic syndromes: Secondary | ICD-10-CM

## 2014-10-20 MED ORDER — DRONABINOL 2.5 MG PO CAPS: 5.0000 mg | ORAL_CAPSULE | Freq: Two times a day (BID) | ORAL | Status: DC

## 2014-10-20 MED ORDER — POTASSIUM CHLORIDE CRYS ER 20 MEQ PO TBCR: 40.0000 meq | EXTENDED_RELEASE_TABLET | Freq: Two times a day (BID) | ORAL | Status: DC

## 2014-10-20 MED ORDER — DRONABINOL 5 MG PO CAPS: 5.0000 mg | ORAL_CAPSULE | Freq: Two times a day (BID) | ORAL | Status: DC

## 2014-10-20 NOTE — Telephone Encounter (Signed)
Called patient and spoke to her to follow up with patient after yesterdays clinic visit. Patient is feeling a little better this morning. He is taking his new prescriptions as prescribed. His shingles is about the same, but the patient feels like they are less uncomfortable. Wife has no concerns or questions at this time. Will call the office if he has any other difficulties.

## 2014-10-20 NOTE — Telephone Encounter (Signed)
Patient is unable to keep down the liquid potassium. [name] would like to know if we'd send in the tablets. Spoke to Dr Marin Olp and he is fine with changing the drug form. New prescription sent to pharmacy.   Wife would also like patient to try the Marinol again. She has spoken to her pharmacy and they can't get the medication in until tomorrow. She would like a small quantity sent to another pharmacy for them to fill until the main pharmacy can get the medication in stock. Dr Marin Olp is fine with this plan. Marinol prescriptions sent to appropriate pharmacies.

## 2014-10-21 ENCOUNTER — Emergency Department (HOSPITAL_BASED_OUTPATIENT_CLINIC_OR_DEPARTMENT_OTHER)
Admission: EM | Admit: 2014-10-21 | Discharge: 2014-10-21 | Disposition: A | Discharge status: Home / Self Care | Payer: Medicare Other | Attending: Emergency Medicine | Admitting: Emergency Medicine

## 2014-10-21 ENCOUNTER — Other Ambulatory Visit: Payer: Self-pay

## 2014-10-21 ENCOUNTER — Emergency Department (HOSPITAL_BASED_OUTPATIENT_CLINIC_OR_DEPARTMENT_OTHER): Payer: Medicare Other

## 2014-10-21 ENCOUNTER — Ambulatory Visit: Payer: Medicare Other

## 2014-10-21 ENCOUNTER — Encounter (HOSPITAL_BASED_OUTPATIENT_CLINIC_OR_DEPARTMENT_OTHER): Payer: Self-pay | Admitting: *Deleted

## 2014-10-21 DIAGNOSIS — E7800 Pure hypercholesterolemia, unspecified: Secondary | ICD-10-CM | POA: Insufficient documentation

## 2014-10-21 DIAGNOSIS — R011 Cardiac murmur, unspecified: Secondary | ICD-10-CM | POA: Diagnosis not present

## 2014-10-21 DIAGNOSIS — Z87891 Personal history of nicotine dependence: Secondary | ICD-10-CM | POA: Diagnosis not present

## 2014-10-21 DIAGNOSIS — I2510 Atherosclerotic heart disease of native coronary artery without angina pectoris: Secondary | ICD-10-CM | POA: Diagnosis not present

## 2014-10-21 DIAGNOSIS — I252 Old myocardial infarction: Secondary | ICD-10-CM | POA: Insufficient documentation

## 2014-10-21 DIAGNOSIS — I10 Essential (primary) hypertension: Secondary | ICD-10-CM | POA: Diagnosis not present

## 2014-10-21 DIAGNOSIS — Z79899 Other long term (current) drug therapy: Secondary | ICD-10-CM | POA: Insufficient documentation

## 2014-10-21 DIAGNOSIS — R410 Disorientation, unspecified: Secondary | ICD-10-CM | POA: Diagnosis not present

## 2014-10-21 DIAGNOSIS — B029 Zoster without complications: Secondary | ICD-10-CM | POA: Diagnosis not present

## 2014-10-21 DIAGNOSIS — Z862 Personal history of diseases of the blood and blood-forming organs and certain disorders involving the immune mechanism: Secondary | ICD-10-CM | POA: Insufficient documentation

## 2014-10-21 DIAGNOSIS — F329 Major depressive disorder, single episode, unspecified: Secondary | ICD-10-CM | POA: Diagnosis not present

## 2014-10-21 DIAGNOSIS — Z7982 Long term (current) use of aspirin: Secondary | ICD-10-CM | POA: Diagnosis not present

## 2014-10-21 DIAGNOSIS — M1990 Unspecified osteoarthritis, unspecified site: Secondary | ICD-10-CM | POA: Insufficient documentation

## 2014-10-21 DIAGNOSIS — K219 Gastro-esophageal reflux disease without esophagitis: Secondary | ICD-10-CM | POA: Diagnosis not present

## 2014-10-21 DIAGNOSIS — R4182 Altered mental status, unspecified: Secondary | ICD-10-CM | POA: Diagnosis present

## 2014-10-21 LAB — COMPREHENSIVE METABOLIC PANEL
ALT: 32 U/L (ref 17–63)
AST: 29 U/L (ref 15–41)
Albumin: 2.8 g/dL — ABNORMAL LOW (ref 3.5–5.0)
Alkaline Phosphatase: 58 U/L (ref 38–126)
Anion gap: 9 (ref 5–15)
BUN: 27 mg/dL — AB (ref 6–20)
CHLORIDE: 107 mmol/L (ref 101–111)
CO2: 21 mmol/L — AB (ref 22–32)
CREATININE: 1.25 mg/dL — AB (ref 0.61–1.24)
Calcium: 7.9 mg/dL — ABNORMAL LOW (ref 8.9–10.3)
GFR calc Af Amer: >60 mL/min (ref >60)
GFR calc non Af Amer: 58 mL/min — ABNORMAL LOW (ref >60)
Glucose, Bld: 142 mg/dL — ABNORMAL HIGH (ref 65–99)
POTASSIUM: 3.4 mmol/L — AB (ref 3.5–5.1)
SODIUM: 137 mmol/L (ref 135–145)
Total Bilirubin: 0.7 mg/dL (ref 0.3–1.2)
Total Protein: 6.9 g/dL (ref 6.5–8.1)

## 2014-10-21 LAB — CBC WITH DIFFERENTIAL/PLATELET
BASOS ABS: 0 10*3/uL (ref 0.0–0.1)
Band Neutrophils: 1 %
Basophils Relative: 0 %
EOS ABS: 0.1 10*3/uL (ref 0.0–0.7)
Eosinophils Relative: 1 %
HCT: 24.4 % — ABNORMAL LOW (ref 39.0–52.0)
Hemoglobin: 8.3 g/dL — ABNORMAL LOW (ref 13.0–17.0)
LYMPHS ABS: 0.9 10*3/uL (ref 0.7–4.0)
Lymphocytes Relative: 13 %
MCH: 28.4 pg (ref 26.0–34.0)
MCHC: 34 g/dL (ref 30.0–36.0)
MCV: 83.6 fL (ref 78.0–100.0)
MONO ABS: 0.8 10*3/uL (ref 0.1–1.0)
Monocytes Relative: 11 %
NEUTROS PCT: 74 %
Neutro Abs: 5.2 10*3/uL (ref 1.7–7.7)
PLATELETS: 44 10*3/uL — AB (ref 150–400)
RBC: 2.92 MIL/uL — AB (ref 4.22–5.81)
RDW: 14.4 % (ref 11.5–15.5)
WBC: 7 10*3/uL (ref 4.0–10.5)

## 2014-10-21 LAB — URINALYSIS, ROUTINE W REFLEX MICROSCOPIC
Glucose, UA: NEGATIVE mg/dL
Ketones, ur: 15 mg/dL — AB
Nitrite: NEGATIVE
PROTEIN: 100 mg/dL — AB
SPECIFIC GRAVITY, URINE: 1.027 (ref 1.005–1.030)
UROBILINOGEN UA: 1 mg/dL (ref 0.0–1.0)
pH: 6 (ref 5.0–8.0)

## 2014-10-21 LAB — URINE MICROSCOPIC-ADD ON

## 2014-10-21 LAB — I-STAT CG4 LACTIC ACID, ED: Lactic Acid, Venous: 0.8 mmol/L (ref 0.5–2.0)

## 2014-10-21 LAB — CBG MONITORING, ED: GLUCOSE-CAPILLARY: 139 mg/dL — AB (ref 65–99)

## 2014-10-21 MED ORDER — SODIUM CHLORIDE 0.9 % IV BOLUS (SEPSIS)
1000.0000 mL | Freq: Once | INTRAVENOUS | Status: AC
  Administered 2014-10-21: 1000 mL via INTRAVENOUS

## 2014-10-21 NOTE — ED Provider Notes (Signed)
CSN: [id number]     Arrival date & time 10/21/14  1925 History   First MD Initiated Contact with Patient 10/21/14 1938     Chief Complaint  Patient presents with  . Altered Mental Status   Level V caveat: Altered mental status  (Consider location/radiation/quality/duration/timing/severity/associated sxs/prior Treatment) HPI [name] is a 69 y.o. male with multiple medical problems including CAD, MDS, spontaneous pneumothorax of left lung with recent bleb stapling on 10/11/14, comes in for evaluation of altered mental status. Patient is accompanied by his wife who gives history of present illness. Wife states patient recently discharged from hospital for collapsed lung. States he was discharged on a lot of medicines and yesterday started acting peculiar. She states he has been carrying on conversations but the words don't make sense. She reports on the way to the ED today he rolled down the window to flip out a cigarette today, but he has not smoked in many years. Has also been reaching for objects in the car that are not there. Patient denies any pain or discomfort now.  Wife also reports patient required shingles while in the hospital.  Past Medical History  Diagnosis Date  . Coronary atherosclerosis of native coronary artery     a. 10/2008 inf STEMI/PCI: LM 50d (IVUS-borderline lesion->med rx), LAD min irregs, LCX 28m, 70d, OM nl, RCA 190m (3.5x28 Vision BMS);  b. 11/2008 Lexiscan MV: EF 65%, no isch/scar.  . Essential hypertension, benign   . Pure hypercholesterolemia   . AAA (abdominal aortic aneurysm) ([place])     a. 05/2013 CT: 5.8 cm AAA.  Marland Kitchen Diverticulitis     a. 05/2013 CT: descending/sigmoid jxn w/o abscess.  . Osteoarthritis   . Tobacco abuse     a. ongoing - 1ppd for better part of 50 yrs.  . Normocytic anemia   . Cellulitis 06/10/2013    Right antecubital fossa at site of IV  03/12/13  . PONV (postoperative nausea and vomiting)   . Diverticulitis   . GERD (gastroesophageal  reflux disease)   . MDS (myelodysplastic syndrome), high grade ([place]) 04/13/2014  . Myocardial infarction ([place]) 2010  . Depression   . Spontaneous pneumothorax 09/14/2014    LEFT LUNG  . Hypotestosteronemia 10/04/2014   Past Surgical History  Procedure Laterality Date  . Cardiac stents  2010  . Cholecystectomy N/A 06/19/2013    Procedure: LAPAROSCOPIC CHOLECYSTECTOMY;  Surgeon: Harl Bowie, MD;  Location: [place];  Service: General;  Laterality: N/A;  . Abdominal aortic endovascular stent graft N/A 07/02/2013    Procedure: ABDOMINAL AORTIC ENDOVASCULAR STENT GRAFT;  Surgeon: Serafina Mitchell, MD;  Location: [facility] OR;  Service: Vascular;  Laterality: N/A;  . Esophagogastroduodenoscopy N/A 08/05/2013    Procedure: ESOPHAGOGASTRODUODENOSCOPY (EGD);  Surgeon: Lear Ng, MD;  Location: [facility] ENDOSCOPY;  Service: Endoscopy;  Laterality: N/A;  . Eye surgery Left     cataract surgery  . Back surgery      cervical fusion  . Colonoscopy with propofol N/A 10/15/2013    Procedure: COLONOSCOPY WITH PROPOFOL;  Surgeon: Lear Ng, MD;  Location: [place];  Service: Endoscopy;  Laterality: N/A;  . Fetal blood transfusion  March 16,17,18, 2016  . Bone marrow biopsy  April 01, 2014  . Chest tube insertion  09/14/2014  . Video assisted thoracoscopy Left 10/11/2014    Procedure: VIDEO ASSISTED THORACOSCOPY;  Surgeon: Ivin Poot, MD;  Location: [place];  Service: Thoracic;  Laterality: Left;  . Stapling of blebs Left  10/11/2014    Procedure: STAPLING OF BLEBS;  Surgeon: Ivin Poot, MD;  Location: [facility] OR;  Service: Thoracic;  Laterality: Left;   Family History  Problem Relation Age of Onset  . Heart attack Brother     31s  . Cancer Father     Lung  . Cancer Mother     Brain  . Cancer Brother     Kidney   Social History  Substance Use Topics  . Smoking status: Former Smoker -- 1.00 packs/day for 60 years    Types: Cigarettes    Start date: 04/05/1964  . Smokeless tobacco:  Never Used     Comment: 09/14/2014  . Alcohol Use: No    Review of Systems    Allergies  Albuterol-ipratropium-soybean lecithin; Cephalexin; Compazine; Percocet; Phenergan; Tramadol; Vicodin; Marinol; and Reglan  Home Medications   Prior to Admission medications   Medication Sig Start Date End Date Taking? Authorizing Provider  acetaminophen (TYLENOL) 500 MG tablet Take 500 mg by mouth every 6 (six) hours as needed for fever.    Historical Provider, MD  ALPRAZolam Duanne Moron) 0.25 MG tablet Take 1 tablet (0.25 mg total) by mouth 3 (three) times daily as needed for anxiety. 10/03/14   Volanda Napoleon, MD  aspirin EC 81 MG tablet Take 1 tablet (81 mg total) by mouth daily. 03/30/14   Sherren Mocha, MD  carboxymethylcellulose (REFRESH TEARS) 0.5 % SOLN Place 2 drops into both eyes 3 (three) times daily as needed (For dry eyes.).    Historical Provider, MD  diphenhydrAMINE (BENADRYL) 25 mg capsule Take 1 capsule (25 mg total) by mouth every 6 (six) hours as needed for itching. May take with Percocet PRN 10/17/14   Donielle Liston Alba, PA-C  dronabinol (MARINOL) 2.5 MG capsule Take 2 capsules (5 mg total) by mouth 2 (two) times daily before a meal. 10/20/14   Volanda Napoleon, MD  dronabinol (MARINOL) 5 MG capsule Take 1 capsule (5 mg total) by mouth 2 (two) times daily before lunch and supper. 10/20/14   Volanda Napoleon, MD  famciclovir (FAMVIR) 500 MG tablet Take 1 tablet (500 mg total) by mouth 3 (three) times daily. 10/19/14   Volanda Napoleon, MD  feeding supplement, ENSURE ENLIVE, (ENSURE ENLIVE) LIQD Take 237 mLs by mouth 2 (two) times daily between meals. 10/17/14   Donielle Liston Alba, PA-C  fluconazole (DIFLUCAN) 100 MG tablet Take 1 tablet (100 mg total) by mouth daily. 10/19/14   Volanda Napoleon, MD  lidocaine-prilocaine (EMLA) cream Apply 1 application topically as needed. Please dispense 2 tubes 10/12/14   Volanda Napoleon, MD  LORazepam (ATIVAN) 0.5 MG tablet Take 1 tablet (0.5 mg total) by  mouth every 6 (six) hours as needed (Nausea or vomiting). 04/11/14   Volanda Napoleon, MD  metoprolol tartrate (LOPRESSOR) 25 MG tablet Take 0.5 tablets (12.5 mg total) by mouth 2 (two) times daily. 08/03/13   Sherren Mocha, MD  nitroGLYCERIN (NITROSTAT) 0.4 MG SL tablet Place 1 tablet (0.4 mg total) under the tongue every 5 (five) minutes as needed for chest pain. 03/29/14   Sherren Mocha, MD  ondansetron (ZOFRAN ODT) 4 MG disintegrating tablet Take 1 tablet (4 mg total) by mouth every 8 (eight) hours as needed for nausea or vomiting. 10/17/14   Donielle Liston Alba, PA-C  ondansetron (ZOFRAN) 8 MG tablet Take 1 tablet (8 mg total) by mouth every 8 (eight) hours as needed for nausea or vomiting. 10/18/14   Volanda Napoleon,  MD  oxyCODONE-acetaminophen (PERCOCET) 7.5-325 MG tablet Take 1 tablet by mouth every 4 (four) hours as needed for severe pain. 10/17/14   Donielle Liston Alba, PA-C  pantoprazole (PROTONIX) 40 MG tablet Take 40 mg by mouth at bedtime.  08/06/13   Nita Sells, MD  Potassium Chloride 40 MEQ/15ML (20%) SOLN Take 40 mEq by mouth 2 (two) times daily. 10/19/14   Volanda Napoleon, MD  potassium chloride SA (K-DUR,KLOR-CON) 20 MEQ tablet Take 2 tablets (40 mEq total) by mouth 2 (two) times daily. 10/20/14   Volanda Napoleon, MD  simvastatin (ZOCOR) 20 MG tablet Take 1 tablet (20 mg total) by mouth at bedtime. 03/30/14   Sherren Mocha, MD  temazepam (RESTORIL) 15 MG capsule Take 15 mg by mouth at bedtime as needed for sleep.    Historical Provider, MD  testosterone cypionate (DEPOTESTOTERONE CYPIONATE) 100 MG/ML injection Inject 400 mg into the muscle every 28 (twenty-eight) days. For IM use only    Historical Provider, MD  thiamine 100 MG tablet Take 1 tablet (100 mg total) by mouth daily. 10/17/14   Donielle Liston Alba, PA-C   BP 104/63 mmHg  Pulse 88  Temp(Src) 100.9 F (38.3 C) (Rectal)  Resp 20  Ht _0  (1.778 m)  Wt 170 lb (77.111 kg)  BMI 24.39 kg/m2  SpO2 100% Physical Exam   Constitutional: He is oriented to person, place, and time. He appears well-developed and well-nourished.  HENT:  Head: Normocephalic and atraumatic.  Mouth/Throat: Oropharynx is clear and moist.  Eyes: Conjunctivae and EOM are normal. Pupils are equal, round, and reactive to light. Right eye exhibits no discharge. Left eye exhibits no discharge. No scleral icterus.  Neck: Normal range of motion. Neck supple.  Cardiovascular: Normal rate and regular rhythm.   S1 murmur.  Pulmonary/Chest: Effort normal and breath sounds normal. No respiratory distress. He has no wheezes. He has no rales.  Shingles rash noted to right mid back and ribs  Abdominal: Soft. There is no tenderness.  Musculoskeletal: He exhibits no tenderness.  Neurological: He is alert and oriented to person, place, and time.  Cranial Nerves II-XII grossly intact. Patient is actively tremulous. Motor strength appears to be baseline and sensation appears to be intact.  Skin: Skin is warm and dry. No rash noted.  Psychiatric: He has a normal mood and affect.  Patient is able to follow basic commands. However, does not communicate freely during the exam.  Nursing note and vitals reviewed.   ED Course  Procedures (including critical care time) Labs Review Labs Reviewed  CBC WITH DIFFERENTIAL/PLATELET - Abnormal; Notable for the following:    RBC 2.92 (*)    Hemoglobin 8.3 (*)    HCT 24.4 (*)    Platelets 44 (*)    All other components within normal limits  COMPREHENSIVE METABOLIC PANEL - Abnormal; Notable for the following:    Potassium 3.4 (*)    CO2 21 (*)    Glucose, Bld 142 (*)    BUN 27 (*)    Creatinine, Ser 1.25 (*)    Calcium 7.9 (*)    Albumin 2.8 (*)    GFR calc non Af Amer 58 (*)    All other components within normal limits  URINALYSIS, ROUTINE W REFLEX MICROSCOPIC (NOT AT [facility]) - Abnormal; Notable for the following:    Color, Urine AMBER (*)    APPearance CLOUDY (*)    Hgb urine dipstick SMALL (*)     Bilirubin Urine SMALL (*)  Ketones, ur 15 (*)    Protein, ur 100 (*)    Leukocytes, UA SMALL (*)    All other components within normal limits  CBG MONITORING, ED - Abnormal; Notable for the following:    Glucose-Capillary 139 (*)    All other components within normal limits  CULTURE, BLOOD (ROUTINE X 2)  CULTURE, BLOOD (ROUTINE X 2)  URINE MICROSCOPIC-ADD ON  I-STAT CG4 LACTIC ACID, ED    Imaging Review Dg Chest 2 View  10/21/2014   CLINICAL DATA:  Altered mental status.  EXAM: CHEST  2 VIEW  COMPARISON:  October 17, 2014.  FINDINGS: The heart size and mediastinal contours are within normal limits. Right internal jugular Port-A-Cath is stable with distal tip in expected position of SVC. Stable mild pleural thickening or loculated pleural effusion is noted in left hemithorax. Left internal jugular catheter noted on prior exam has been removed. No pneumothorax is noted. Right lung is clear. The visualized skeletal structures are unremarkable.  IMPRESSION: Stable mild pleural thickening or loculated pleural effusion is noted involving the left hemithorax. No significant changes noted compared to prior exam.   Electronically Signed   By: Marijo Conception, M.D.   On: 10/21/2014 20:29   I have personally reviewed and evaluated these images and lab results as part of my medical decision-making.   EKG Interpretation   Date/Time:  Friday October 21 2014 20:45:47 EDT Ventricular Rate:  88 PR Interval:  146 QRS Duration: 90 QT Interval:  396 QTC Calculation: 479 R Axis:   80 Text Interpretation:  Normal sinus rhythm Normal ECG No significant change  since last tracing Confirmed by Maryan Rued  MD, Loree Fee ([id number]) on 10/21/2014  10:03:22 PM     Meds given in ED:  Medications  sodium chloride 0.9 % bolus 1,000 mL (0 mLs Intravenous Stopped 10/21/14 2216)    Discharge Medication List as of 10/21/2014 10:07 PM     Filed Vitals:   10/21/14 2100 10/21/14 2150 10/21/14 2152 10/21/14 2200  BP:  114/85 111/65 111/65 104/63  Pulse: 88 86 85 88  Temp:      TempSrc:      Resp: _0 Height:      Weight:      SpO2: 98% 98% 100% 100%    MDM  AAREN ATALLAH is a 69 y.o. male with a history of MDS on chemotherapy, previously diagnosed shingles, recent admission on 9/25 for recurrent spontaneous pneumothorax with subsequent VATS, comes in for evaluation of altered mental status. Patient's wife reports since yesterday patient has been increasingly confused, carrying on word salad type conversations. Patient is also picking at objects on himself that do not exist, rolling down windows and flipping away imaginary cigarettes.  On arrival in the ED, patient is tachycardic to 107, febrile to 100.79F. Physical exam is unremarkable other than diffuse upper body resting tremors. Benign cardiopulmonary and abdominal exams Initiated SIRS workup. Obtained blood cultures. Labs are grossly unremarkable, lactic acid is 0.80. White count is 7.0, potassium is 3.4, creatinine 1.25, no evidence of UTI Patient with fever of unknown source as well as delirium. Delirium possibly secondary to medication change, shingles, immunocompromise state. Discussed with patient benefit of medical admission for further evaluation and management of fever and delirium; however, wife at bedside declines medical admission at this time and prefers to take patient home to care for him. I feel that wife is reasonable and will be able to take care of patient at home.  Has close follow-up with PCP and will be able to return to ED without difficulty for reevaluation if current status deteriorates. On discharge, patient's vital signs are much improved and patient is stable for discharge. Wife states she will continue giving Tylenol at home for fever. Discussed and reviewed this case with my attending, Dr. Maryan Rued who also saw and evaluated the patient and agrees with above plan. Final diagnoses:  Delirium        Comer Locket, PA-C 10/22/14 [place], MD 10/26/14 2034

## 2014-10-21 NOTE — ED Notes (Signed)
Recent hospitalization released Monday from 10 day visit. Pt wife states pt is hallucinating and not acting right. Pt wife states on a bunch of medication from lung surgery.

## 2014-10-21 NOTE — ED Notes (Signed)
Altered mental status since yesterday. Pill rolling. Recent lung surgery. Recent shingles. He is taking new medications.

## 2014-10-21 NOTE — ED Notes (Signed)
per wife pt is confused,  Picking clothes,  Taping fingers, seeing things that are not therethat

## 2014-10-21 NOTE — Discharge Instructions (Signed)
You were evaluated in the ED today for your symptoms and they are most consistent with delirium. Your laboratory results did not show any reasons for the delirium. It is important for you to follow-up with your doctor in 2 days for reevaluation. Return to the ED if symptoms worsen.  Delirium Delirium is a state of mental confusion. It comes on quickly and causes significant changes in a person's thinking and behavior. People with delirium usually have trouble paying attention to what is going on or knowing where they are. They may become very withdrawn or very emotional and unable to sit still. They may even see or feel things that are not there (hallucinations). Delirium is a sign of a serious underlying medical condition. CAUSES Delirium occurs when something suddenly affects the signals that the brain sends out. Brain signals can be affected by anything that puts severe stress on the body and brain and causes brain chemicals to be out of balance. The most common causes of delirium include:  Infections. These may be bacterial, viral, fungal, or protozoal.  Medicines. These include many over-the-counter and prescription medicines.  Recreational drugs.  Substance withdrawal. This occurs with sudden discontinuation of alcohol, certain medicines, or recreational drugs.  Surgery.  Sudden vascular events, such as stroke, brain hemorrhage, and severe migraine.  Other brain disorders, such as tumors, seizures, and physical head trauma.  Metabolic disorders, such as kidney or liver failure.  Low blood oxygen (anoxia). This may occur with lung disease, cardiac arrest, or carbon monoxide poisoning.  Hormone imbalances (endocrinopathies), such as an overactive thyroid (hyperthyroidism) or underactive thyroid (hypothyroidism).  Vitamin deficiencies. RISK FACTORS This condition is more likely to develop in:  Children.  Older people.  People who live alone.  People who have vision loss or  hearing loss.  People who have existing brain disease, such as dementia.  People who have long-lasting (chronic) medical conditions, such as heart disease.  People who are hospitalized for long periods of time. SYMPTOMS Delirium starts with a sudden change in a person's thinking or behavior. Symptoms come and go (fluctuate) over time, and they are often worse at the end of the day. Symptoms include:  Not being able to stay awake (drowsiness) or pay attention.  Being confused about places, time, and people.  Forgetfulness.  Having extreme energy levels. These may be low or high.  Changes in sleep patterns.  Extreme mood swings, such as anger or anxiety.  Focusing on things or ideas that are not important.  Rambling and senseless talking.  Difficulty speaking, understanding speech, or both.  Hallucinations.  Tremor or unsteady gait. DIAGNOSIS People with delirium may not realize that they have the condition. Often, a family member or health care provider is the first person to notice the changes. The health care provider will obtain a detailed history of current symptoms, medical issues, medicines, and recreational drug use. The health care provider will perform a mental status examination by:  Asking questions to check for confusion.  Watching for abnormal behavior. The health care provider may perform a physical exam and order lab tests or additional studies to determine the cause of the delirium. TREATMENT Treatment of delirium depends on the cause and severity. Delirium usually goes away within days or weeks of treating the underlying cause. In the meantime, the person should not be left alone because he or she may accidentally cause self-harm. Treatment includes supportive care, such as:  Increased light during the day and decreased light at night.  Low noise level.  Uninterrupted sleep.  A regular daily schedule.  Clocks and calendars to help with  orientation.  Familiar objects, including the person's pictures and clothing.  Frequent visits from familiar family and friends.  Healthy diet.  Exercise. In more severe cases of delirium, medicine may be prescribed to help the person to keep calm and think more clearly. HOME CARE INSTRUCTIONS  Any supportive care should be continued as told by the health care provider.  All medicines should be used as told by the health care provider. This is important.  The health care provider should be consulted before over-the-counter medicines, herbs, or supplements are used.  All follow-up visits should be kept as told by the health care provider. This is important.  Alcohol and recreational drugs should be avoided as told by the health care provider. SEEK MEDICAL CARE IF:  Symptoms do not get better or they become worse.  New symptoms of delirium develop.  Caring for the person at home does not seem safe.  Eating, drinking, or communicating stops.  There are side effects of medicines, such as changes in sleep patterns, dizziness, weight gain, restlessness, movement changes, or tremors. SEEK IMMEDIATE MEDICAL CARE IF:  Serious thoughts occur about self-harm or about hurting others.  There are serious side effects of medicine, such as:  Swelling of the face, lips, tongue, or throat.  Fever, confusion, muscle spasms, or seizures.   This information is not intended to replace advice given to you by your health care provider. Make sure you discuss any questions you have with your health care provider.   Document Released: 09/25/2011 Document Revised: 05/17/2014 Document Reviewed: 02/23/2014 Elsevier Interactive Patient Education 2016 [employer].  Confusion Confusion is the inability to think with your usual speed or clarity. Confusion may come on quickly or slowly over time. How quickly the confusion comes on depends on the cause. Confusion can be due to any number of  causes. CAUSES   Concussion, head injury, or head trauma.  Seizures.  Stroke.  Fever.  Brain tumor.  Age related decreased brain function (dementia).  Heightened emotional states like rage or terror.  Mental illness in which the person loses the ability to determine what is real and what is not (hallucinations).  Infections such as a urinary tract infection (UTI).  Toxic effects from alcohol, drugs, or prescription medicines.  Dehydration and an imbalance of salts in the body (electrolytes).  Lack of sleep.  Low blood sugar (diabetes).  Low levels of oxygen from conditions such as chronic lung disorders.  Drug interactions or other medicine side effects.  Nutritional deficiencies, especially niacin, thiamine, vitamin C, or vitamin B.  Sudden drop in body temperature (hypothermia).  Change in routine, such as when traveling or hospitalized. SIGNS AND SYMPTOMS  People often describe their thinking as cloudy or unclear when they are confused. Confusion can also include feeling disoriented. That means you are unaware of where or who you are. You may also not know what the date or time is. If confused, you may also have difficulty paying attention, remembering, and making decisions. Some people also act aggressively when they are confused.  DIAGNOSIS  The medical evaluation of confusion may include:  Blood and urine tests.  X-rays.  Brain and nervous system tests.  Analyzing your brain waves (electroencephalogram or EEG).  Magnetic resonance imaging (MRI) of your head.  Computed tomography (CT) scan of your head.  Mental status tests in which your health care provider may ask  many questions. Some of these questions may seem silly or strange, but they are a very important test to help diagnose and treat confusion. TREATMENT  An admission to the hospital may not be needed, but a person with confusion should not be left alone. Stay with a family member or friend until  the confusion clears. Avoid alcohol, pain relievers, or sedative drugs until you have fully recovered. Do not drive until directed by your health care provider. HOME CARE INSTRUCTIONS  What family and friends can do:  To find out if someone is confused, ask the person to state his or her name, age, and the date. If the person is unsure or answers incorrectly, he or she is confused.  Always introduce yourself, no matter how well the person knows you.  Often remind the person of his or her location.  Place a calendar and clock near the confused person.  Help the person with his or her medicines. You may want to use a pill box, an alarm as a reminder, or give the person each dose as prescribed.  Talk about current events and plans for the day.  Try to keep the environment calm, quiet, and peaceful.  Make sure the person keeps follow-up visits with his or her health care provider. PREVENTION  Ways to prevent confusion:  Avoid alcohol.  Eat a balanced diet.  Get enough sleep.  Take medicine only as directed by your health care provider.  Do not become isolated. Spend time with other people and make plans for your days.  Keep careful watch on your blood sugar levels if you are diabetic. SEEK IMMEDIATE MEDICAL CARE IF:   You develop severe headaches, repeated vomiting, seizures, blackouts, or slurred speech.  There is increasing confusion, weakness, numbness, restlessness, or personality changes.  You develop a loss of balance, have marked dizziness, feel uncoordinated, or fall.  You have delusions, hallucinations, or develop severe anxiety.  Your family members think you need to be rechecked.   This information is not intended to replace advice given to you by your health care provider. Make sure you discuss any questions you have with your health care provider.   Document Released: 02/08/2004 Document Revised: 01/21/2014 Document Reviewed: 02/05/2013 Elsevier Interactive  Patient Education [employer].

## 2014-10-24 ENCOUNTER — Encounter: Payer: Self-pay | Admitting: Hematology & Oncology

## 2014-10-24 ENCOUNTER — Ambulatory Visit: Payer: Medicare Other

## 2014-10-24 ENCOUNTER — Ambulatory Visit (HOSPITAL_BASED_OUTPATIENT_CLINIC_OR_DEPARTMENT_OTHER): Payer: Medicare Other | Admitting: Hematology & Oncology

## 2014-10-24 ENCOUNTER — Ambulatory Visit (HOSPITAL_BASED_OUTPATIENT_CLINIC_OR_DEPARTMENT_OTHER): Payer: Medicare Other

## 2014-10-24 ENCOUNTER — Other Ambulatory Visit (HOSPITAL_BASED_OUTPATIENT_CLINIC_OR_DEPARTMENT_OTHER): Payer: Medicare Other

## 2014-10-24 ENCOUNTER — Other Ambulatory Visit: Payer: Medicare Other

## 2014-10-24 VITALS — BP 118/64 | HR 91 | Temp 99.4°F | Resp 18 | Ht 70.0 in | Wt 172.0 lb

## 2014-10-24 DIAGNOSIS — D469 Myelodysplastic syndrome, unspecified: Secondary | ICD-10-CM

## 2014-10-24 DIAGNOSIS — D46Z Other myelodysplastic syndromes: Secondary | ICD-10-CM

## 2014-10-24 DIAGNOSIS — G8918 Other acute postprocedural pain: Secondary | ICD-10-CM

## 2014-10-24 DIAGNOSIS — E291 Testicular hypofunction: Secondary | ICD-10-CM

## 2014-10-24 DIAGNOSIS — R0789 Other chest pain: Principal | ICD-10-CM

## 2014-10-24 LAB — CBC WITH DIFFERENTIAL (CANCER CENTER ONLY)
HEMATOCRIT: 22.2 % — AB (ref 38.7–49.9)
HEMOGLOBIN: 7.3 g/dL — AB (ref 13.0–17.1)
MCH: 28.3 pg (ref 28.0–33.4)
MCHC: 32.9 g/dL (ref 32.0–35.9)
MCV: 86 fL (ref 82–98)
Platelets: 37 10*3/uL — ABNORMAL LOW (ref 145–400)
RBC: 2.58 10*6/uL — AB (ref 4.20–5.70)
RDW: 14.3 % (ref 11.1–15.7)
WBC: 3.4 10*3/uL — AB (ref 4.0–10.0)

## 2014-10-24 LAB — CMP (CANCER CENTER ONLY)
ALT(SGPT): 33 U/L (ref 10–47)
AST: 28 U/L (ref 11–38)
Albumin: 2.9 g/dL — ABNORMAL LOW (ref 3.3–5.5)
Alkaline Phosphatase: 54 U/L (ref 26–84)
BILIRUBIN TOTAL: 0.9 mg/dL (ref 0.20–1.60)
BUN, Bld: 16 mg/dL (ref 7–22)
CHLORIDE: 105 meq/L (ref 98–108)
CO2: 24 meq/L (ref 18–33)
CREATININE: 1.1 mg/dL (ref 0.6–1.2)
Calcium: 8.3 mg/dL (ref 8.0–10.3)
GLUCOSE: 138 mg/dL — AB (ref 73–118)
Potassium: 3.2 mEq/L — ABNORMAL LOW (ref 3.3–4.7)
SODIUM: 131 meq/L (ref 128–145)
Total Protein: 7.3 g/dL (ref 6.4–8.1)

## 2014-10-24 LAB — MANUAL DIFFERENTIAL (CHCC SATELLITE)
ALC: 0.6 10*3/uL — AB (ref 0.9–3.3)
ANC (CHCC HP manual diff): 2.4 10*3/uL (ref 1.5–6.5)
LYMPH: 17 % (ref 14–48)
MONO: 13 % (ref 0–13)
PLATELET MORPHOLOGY: NORMAL
PLT EST ~~LOC~~: DECREASED
SEG: 70 % (ref 40–75)

## 2014-10-24 LAB — CHCC SATELLITE - SMEAR

## 2014-10-24 LAB — RETICULOCYTES (CHCC)
ABS Retic: 8 10*3/uL — ABNORMAL LOW (ref 19.0–186.0)
RBC.: 2.66 MIL/uL — AB (ref 4.22–5.81)
Retic Ct Pct: 0.3 % — ABNORMAL LOW (ref 0.4–2.3)

## 2014-10-24 LAB — HOLD TUBE, BLOOD BANK - CHCC SATELLITE

## 2014-10-24 MED ORDER — TESTOSTERONE CYPIONATE 200 MG/ML IM SOLN
INTRAMUSCULAR | Status: AC
  Filled 2014-10-24: qty 2

## 2014-10-24 MED ORDER — TESTOSTERONE CYPIONATE 200 MG/ML IM SOLN
400.0000 mg | INTRAMUSCULAR | Status: DC
  Administered 2014-10-24: 400 mg via INTRAMUSCULAR

## 2014-10-24 MED ORDER — DARBEPOETIN ALFA 500 MCG/ML IJ SOSY
PREFILLED_SYRINGE | INTRAMUSCULAR | Status: AC
  Filled 2014-10-24: qty 1

## 2014-10-24 MED ORDER — TAPENTADOL HCL 50 MG PO TABS: ORAL_TABLET | ORAL | Status: DC

## 2014-10-24 MED ORDER — DARBEPOETIN ALFA 500 MCG/ML IJ SOSY
500.0000 ug | PREFILLED_SYRINGE | Freq: Once | INTRAMUSCULAR | Status: AC
  Administered 2014-10-24: 500 ug via SUBCUTANEOUS

## 2014-10-24 MED ORDER — SODIUM CHLORIDE 0.9 % IJ SOLN
10.0000 mL | INTRAMUSCULAR | Status: DC | PRN
  Administered 2014-10-24: 10 mL via INTRAVENOUS
  Filled 2014-10-24: qty 10

## 2014-10-24 MED ORDER — HEPARIN SOD (PORK) LOCK FLUSH 100 UNIT/ML IV SOLN
500.0000 [IU] | Freq: Once | INTRAVENOUS | Status: AC
  Administered 2014-10-24: 500 [IU] via INTRAVENOUS
  Filled 2014-10-24: qty 5

## 2014-10-24 NOTE — Progress Notes (Signed)
Hematology and Oncology Follow Up Visit  [name] [id number] 02-Jun-1945 69 y.o. 10/24/2014   Principle Diagnosis:   RAEB-2 (normal cytogenetics with mutated ASXL-1, TET2, U2AF1 genes)  Herpes zoster of the right T9 dermatome  Recurrent spontaneous left pneumothorax-status post VATS repair  Hypotestosteronemia  Current Therapy:    Dose intense Vidaza-7 day cycles. To start today  Aranesp 500 g subcutaneous every week needed for hemoglobin less than 10     Interim History:  Mr. Wedge is back for follow-up. Is on last week. He had shingles in the right T9 dermatome. I put him on Famvir.  He apparently went to the emergency room last Friday. He was told that he had a withdrawal reaction to Percocet. I just finds hard to believe. He really was not on that with Percocet for all that long.  He apparently has a lot of medication sensitivities.  Thank you, he seems to be doing okay from a respect of the pneumothorax on the left. He has surgery for this. He goes to see the thoracic surgeon next week.  He still is not eating much. He just is very depressed. It is hard to get him to understand how important it is that he needs nutrition. Having this pneumothorax occur again was incredibly distressing to him and really affected him emotionally as well as physically.  His wife is trying to do her best to help him out. It has been difficult for her.  He's had no problems with bleeding. He's had occasional bruises.  He's had no fever.  He is due for chemotherapy today. We are going to delay this 1 week until the shingles heals up better. So far, the lesions are drying up.   He really has had a tough time lately. This second spontaneous pneumothorax really has had a bad effect on him. He just has been depressed. He really does not want anything for this either.  He is supposed to start treatment on October 10. We probably will have to push this back in another week or so. He is  deciding as to whether or not he wants to take any treatment.  Overall, his performance status is ECOG 2.  Medications:  Current outpatient prescriptions:  .  acetaminophen (TYLENOL) 500 MG tablet, Take 500 mg by mouth every 6 (six) hours as needed for fever., Disp: , Rfl:  .  ALPRAZolam (XANAX) 0.25 MG tablet, Take 1 tablet (0.25 mg total) by mouth 3 (three) times daily as needed for anxiety., Disp: 60 tablet, Rfl: 0 .  aspirin EC 81 MG tablet, Take 1 tablet (81 mg total) by mouth daily., Disp: , Rfl:  .  carboxymethylcellulose (REFRESH TEARS) 0.5 % SOLN, Place 2 drops into both eyes 3 (three) times daily as needed (For dry eyes.)., Disp: , Rfl:  .  diphenhydrAMINE (BENADRYL) 25 mg capsule, Take 1 capsule (25 mg total) by mouth every 6 (six) hours as needed for itching. May take with Percocet PRN, Disp: 30 capsule, Rfl: 0 .  famciclovir (FAMVIR) 500 MG tablet, Take 1 tablet (500 mg total) by mouth 3 (three) times daily., Disp: 30 tablet, Rfl: 0 .  feeding supplement, ENSURE ENLIVE, (ENSURE ENLIVE) LIQD, Take 237 mLs by mouth 2 (two) times daily between meals., Disp: 237 mL, Rfl: 12 .  fluconazole (DIFLUCAN) 100 MG tablet, Take 1 tablet (100 mg total) by mouth daily., Disp: 5 tablet, Rfl: 0 .  levofloxacin (LEVAQUIN) 500 MG tablet, , Disp: , Rfl:  .  lidocaine-prilocaine (EMLA) cream, Apply 1 application topically as needed. Please dispense 2 tubes, Disp: 60 g, Rfl: 3 .  LORazepam (ATIVAN) 0.5 MG tablet, Take 1 tablet (0.5 mg total) by mouth every 6 (six) hours as needed (Nausea or vomiting)., Disp: 30 tablet, Rfl: 3 .  metoprolol tartrate (LOPRESSOR) 25 MG tablet, Take 0.5 tablets (12.5 mg total) by mouth 2 (two) times daily., Disp: 90 tablet, Rfl: 3 .  nitroGLYCERIN (NITROSTAT) 0.4 MG SL tablet, Place 1 tablet (0.4 mg total) under the tongue every 5 (five) minutes as needed for chest pain., Disp: 25 tablet, Rfl: 6 .  ondansetron (ZOFRAN ODT) 4 MG disintegrating tablet, Take 1 tablet (4 mg total)  by mouth every 8 (eight) hours as needed for nausea or vomiting., Disp: 20 tablet, Rfl: 0 .  ondansetron (ZOFRAN) 8 MG tablet, Take 1 tablet (8 mg total) by mouth every 8 (eight) hours as needed for nausea or vomiting., Disp: 90 tablet, Rfl: 3 .  pantoprazole (PROTONIX) 40 MG tablet, Take 40 mg by mouth at bedtime. , Disp: , Rfl:  .  Potassium Chloride 40 MEQ/15ML (20%) SOLN, Take 40 mEq by mouth 2 (two) times daily., Disp: 1 Bottle, Rfl: 2 .  potassium chloride SA (K-DUR,KLOR-CON) 20 MEQ tablet, Take 2 tablets (40 mEq total) by mouth 2 (two) times daily., Disp: 120 tablet, Rfl: 3 .  predniSONE (DELTASONE) 20 MG tablet, , Disp: , Rfl:  .  simvastatin (ZOCOR) 20 MG tablet, Take 1 tablet (20 mg total) by mouth at bedtime., Disp: 30 tablet, Rfl: 5 .  temazepam (RESTORIL) 15 MG capsule, Take 15 mg by mouth at bedtime as needed for sleep., Disp: , Rfl:  .  testosterone cypionate (DEPOTESTOTERONE CYPIONATE) 100 MG/ML injection, Inject 400 mg into the muscle every 28 (twenty-eight) days. For IM use only, Disp: , Rfl:  .  thiamine 100 MG tablet, Take 1 tablet (100 mg total) by mouth daily., Disp: 30 tablet, Rfl: 0 .  dronabinol (MARINOL) 2.5 MG capsule, Take 2 capsules (5 mg total) by mouth 2 (two) times daily before a meal. (Patient not taking: Reported on 10/24/2014), Disp: 6 capsule, Rfl: 0 .  dronabinol (MARINOL) 5 MG capsule, Take 1 capsule (5 mg total) by mouth 2 (two) times daily before lunch and supper. (Patient not taking: Reported on 10/24/2014), Disp: 60 capsule, Rfl: 3 .  oxyCODONE-acetaminophen (PERCOCET) 7.5-325 MG tablet, Take 1 tablet by mouth every 4 (four) hours as needed for severe pain. (Patient not taking: Reported on 10/24/2014), Disp: 20 tablet, Rfl: 0 .  tapentadol (NUCYNTA) 50 MG TABS tablet, Take 1-2 tablets, if needed, every 4 hours for pain., Disp: 60 tablet, Rfl: 0 No current facility-administered medications for this visit.  Facility-Administered Medications Ordered in Other  Visits:  .  sodium chloride 0.9 % injection 10 mL, 10 mL, Intravenous, PRN, Volanda Napoleon, MD, 10 mL at 10/24/14 1454 .  testosterone cypionate (DEPOTESTOSTERONE CYPIONATE) injection 400 mg, 400 mg, Intramuscular, Q28 days, Volanda Napoleon, MD, 400 mg at 10/24/14 1440  Allergies:  Allergies  Allergen Reactions  . Albuterol-Ipratropium-Soybean Lecithin [Ipratropium-Albuterol] Anxiety  . Cephalexin Other (See Comments)    Nausea and stomach cramps  . Compazine [Prochlorperazine] Anxiety  . Percocet [Oxycodone-Acetaminophen] Itching    Can take with benadryl    . Phenergan [Promethazine Hcl] Other (See Comments)    "restless legs"  . Tramadol Other (See Comments)    "jerking limbs and talking in his sleep"  . Vicodin [Hydrocodone-Acetaminophen] Itching  Can take with benadryl   . Marinol [Dronabinol] Other (See Comments)    "Affects his eyes"  . Reglan [Metoclopramide] Other (See Comments)    Patient has significant shakes that are troubling. Would not like to receive this if there are other options available.    Past Medical History, Surgical history, Social history, and Family History were reviewed and updated.  Review of Systems: As above  Physical Exam:  height is 5\' 10"  (1.778 m) and weight is 172 lb (78.019 kg). His oral temperature is 99.4 F (37.4 C). His blood pressure is 118/64 and his pulse is 91. His respiration is 18.   Wt Readings from Last 3 Encounters:  10/24/14 172 lb (78.019 kg)  10/21/14 170 lb (77.111 kg)  10/17/14 175 lb 14.4 oz (79.788 kg)     Well-developed and well-nourished white gentleman in no obvious distress. Head and neck exam shows no ocular or oral lesions. There are no palpable cervical or supraclavicular lymph does. Lungs are clear. Cardiac exam regular rate and rhythm with no murmurs, rubs or bruits. Abdomen is soft. Has good bowel sounds. There is no fluid wave. There is no palpable liver or spleen tip. Back exam shows no tenderness over  the spine, ribs or hips. Extremities shows no clubbing, cyanosis or edema. Skin exam shows the herpetic rash in the right T9 dermatome. He has some drying of the vesicles. He has some bleeding into the vesicles. Neurological exam is nonfocal.  Lab Results  Component Value Date   WBC 3.4* 10/24/2014   HGB 7.3* 10/24/2014   HCT 22.2* 10/24/2014   MCV 86 10/24/2014   PLT 37* 10/24/2014     Chemistry      Component Value Date/Time   NA 131 10/24/2014 1334   NA 137 10/21/2014 2000   NA 136 09/12/2014 1325   K 3.2* 10/24/2014 1334   K 3.4* 10/21/2014 2000   K 3.7 09/12/2014 1325   CL 105 10/24/2014 1334   CL 107 10/21/2014 2000   CO2 24 10/24/2014 1334   CO2 21* 10/21/2014 2000   CO2 21* 09/12/2014 1325   BUN 16 10/24/2014 1334   BUN 27* 10/21/2014 2000   BUN 28.0* 09/12/2014 1325   CREATININE 1.1 10/24/2014 1334   CREATININE 1.25* 10/21/2014 2000   CREATININE 1.4* 09/12/2014 1325      Component Value Date/Time   CALCIUM 8.3 10/24/2014 1334   CALCIUM 7.9* 10/21/2014 2000   CALCIUM 8.9 09/12/2014 1325   ALKPHOS 54 10/24/2014 1334   ALKPHOS 58 10/21/2014 2000   ALKPHOS 79 09/12/2014 1325   AST 28 10/24/2014 1334   AST 29 10/21/2014 2000   AST 13 09/12/2014 1325   ALT 33 10/24/2014 1334   ALT 32 10/21/2014 2000   ALT 33 09/12/2014 1325   BILITOT 0.90 10/24/2014 1334   BILITOT 0.7 10/21/2014 2000   BILITOT 0.71 09/12/2014 1325         Impression and Plan: Mr. Gortney is 69 year old gentleman with myelodysplasia. He has normal cytogenetics. We did do the NGS of his blood which showed some abnormalities. Again, these might indicate an increased risk of transformation but it is not definitive.  Again, he has had a really tough time with these pneumothoraces. He required surgery for the second one.  I think that we will be best to hold his treatment 1 week.  I told him to try the Marinol again. We just have not many options for his appetite.  I do think  we should  probably move ahead and give him testosterone and Aranesp today. He has low testosterone levels. Maybe this will help with his anemia.  We will see about having to come back in one week and hopefully start the [place].  I spent a good 45 minutes with he and his wife today.   Volanda Napoleon, MD 10/10/20166:22 PM

## 2014-10-24 NOTE — Patient Instructions (Signed)
tesDarbepoetin Alfa injection What is this medicine? DARBEPOETIN ALFA (dar be POE e tin AL fa) helps your body make more red blood cells. It is used to treat anemia caused by chronic kidney failure and chemotherapy. This medicine may be used for other purposes; ask your health care provider or pharmacist if you have questions. What should I tell my health care provider before I take this medicine? They need to know if you have any of these conditions: -blood clotting disorders or history of blood clots -cancer patient not on chemotherapy -cystic fibrosis -heart disease, such as angina, heart failure, or a history of a heart attack -hemoglobin level of 12 g/dL or greater -high blood pressure -low levels of folate, iron, or vitamin B12 -seizures -an unusual or allergic reaction to darbepoetin, erythropoietin, albumin, hamster proteins, latex, other medicines, foods, dyes, or preservatives -pregnant or trying to get pregnant -breast-feeding How should I use this medicine? This medicine is for injection into a vein or under the skin. It is usually given by a health care professional in a hospital or clinic setting. If you get this medicine at home, you will be taught how to prepare and give this medicine. Do not shake the solution before you withdraw a dose. Use exactly as directed. Take your medicine at regular intervals. Do not take your medicine more often than directed. It is important that you put your used needles and syringes in a special sharps container. Do not put them in a trash can. If you do not have a sharps container, call your pharmacist or healthcare provider to get one. Talk to your pediatrician regarding the use of this medicine in children. While this medicine may be used in children as young as 1 year for selected conditions, precautions do apply. Overdosage: If you think you have taken too much of this medicine contact a poison control center or emergency room at once. NOTE:  This medicine is only for you. Do not share this medicine with others. What if I miss a dose? If you miss a dose, take it as soon as you can. If it is almost time for your next dose, take only that dose. Do not take double or extra doses. What may interact with this medicine? Do not take this medicine with any of the following medications: -epoetin alfa This list may not describe all possible interactions. Give your health care provider a list of all the medicines, herbs, non-prescription drugs, or dietary supplements you use. Also tell them if you smoke, drink alcohol, or use illegal drugs. Some items may interact with your medicine. What should I watch for while using this medicine? Visit your prescriber or health care professional for regular checks on your progress and for the needed blood tests and blood pressure measurements. It is especially important for the doctor to make sure your hemoglobin level is in the desired range, to limit the risk of potential side effects and to give you the best benefit. Keep all appointments for any recommended tests. Check your blood pressure as directed. Ask your doctor what your blood pressure should be and when you should contact him or her. As your body makes more red blood cells, you may need to take iron, folic acid, or vitamin B supplements. Ask your doctor or health care provider which products are right for you. If you have kidney disease continue dietary restrictions, even though this medication can make you feel better. Talk with your doctor or health care professional about the  foods you eat and the vitamins that you take. What side effects may I notice from receiving this medicine? Side effects that you should report to your doctor or health care professional as soon as possible: -allergic reactions like skin rash, itching or hives, swelling of the face, lips, or tongue -breathing problems -changes in vision -chest pain -confusion, trouble speaking  or understanding -feeling faint or lightheaded, falls -high blood pressure -muscle aches or pains -pain, swelling, warmth in the leg -rapid weight gain -severe headaches -sudden numbness or weakness of the face, arm or leg -trouble walking, dizziness, loss of balance or coordination -seizures (convulsions) -swelling of the ankles, feet, hands -unusually weak or tired Side effects that usually do not require medical attention (report to your doctor or health care professional if they continue or are bothersome): -diarrhea -fever, chills (flu-like symptoms) -headaches -nausea, vomiting -redness, stinging, or swelling at site where injected This list may not describe all possible side effects. Call your doctor for medical advice about side effects. You may report side effects to FDA at 1-800-FDA-1088. Where should I keep my medicine? Keep out of the reach of children. Store in a refrigerator between 2 and 8 degrees C (36 and 46 degrees F). Do not freeze. Do not shake. Throw away any unused portion if using a single-dose vial. Throw away any unused medicine after the expiration date. NOTE: This sheet is a summary. It may not cover all possible information. If you have questions about this medicine, talk to your doctor, pharmacist, or health care provider.    2016, [employer] Standard. (2007-12-15 10:23:57) Testosterone injection What is this medicine? TESTOSTERONE (tes TOS ter one) is the main male hormone. It supports normal male development such as muscle growth, facial hair, and deep voice. It is used in males to treat low testosterone levels. This medicine may be used for other purposes; ask your health care provider or pharmacist if you have questions. What should I tell my health care provider before I take this medicine? They need to know if you have any of these conditions: -breast cancer -diabetes -heart disease -kidney disease -liver disease -lung disease -prostate  cancer, enlargement -an unusual or allergic reaction to testosterone, other medicines, foods, dyes, or preservatives -pregnant or trying to get pregnant -breast-feeding How should I use this medicine? This medicine is for injection into a muscle. It is usually given by a health care professional in a hospital or clinic setting. Contact your pediatrician regarding the use of this medicine in children. While this medicine may be prescribed for children as young as 30 years of age for selected conditions, precautions do apply. Overdosage: If you think you have taken too much of this medicine contact a poison control center or emergency room at once. NOTE: This medicine is only for you. Do not share this medicine with others. What if I miss a dose? Try not to miss a dose. Your doctor or health care professional will tell you when your next injection is due. Notify the office if you are unable to keep an appointment. What may interact with this medicine? -medicines for diabetes -medicines that treat or prevent blood clots like warfarin -oxyphenbutazone -propranolol -steroid medicines like prednisone or cortisone This list may not describe all possible interactions. Give your health care provider a list of all the medicines, herbs, non-prescription drugs, or dietary supplements you use. Also tell them if you smoke, drink alcohol, or use illegal drugs. Some items may interact with your medicine. What should  I watch for while using this medicine? Visit your doctor or health care professional for regular checks on your progress. They will need to check the level of testosterone in your blood. This medicine is only approved for use in men who have low levels of testosterone related to certain medical conditions. Heart attacks and strokes have been reported with the use of this medicine. Notify your doctor or health care professional and seek emergency treatment if you develop breathing problems; changes in  vision; confusion; chest pain or chest tightness; sudden arm pain; severe, sudden headache; trouble speaking or understanding; sudden numbness or weakness of the face, arm or leg; loss of balance or coordination. Talk to your doctor about the risks and benefits of this medicine. This medicine may affect blood sugar levels. If you have diabetes, check with your doctor or health care professional before you change your diet or the dose of your diabetic medicine. This drug is banned from use in athletes by most athletic organizations. What side effects may I notice from receiving this medicine? Side effects that you should report to your doctor or health care professional as soon as possible: -allergic reactions like skin rash, itching or hives, swelling of the face, lips, or tongue -breast enlargement -breathing problems -changes in mood, especially anger, depression, or rage -dark urine -general ill feeling or flu-like symptoms -light-colored stools -loss of appetite, nausea -nausea, vomiting -right upper belly pain -stomach pain -swelling of ankles -too frequent or persistent erections -trouble passing urine or change in the amount of urine -unusually weak or tired -yellowing of the eyes or skin Additional side effects that can occur in women include: -deep or hoarse voice -facial hair growth -irregular menstrual periods Side effects that usually do not require medical attention (report to your doctor or health care professional if they continue or are bothersome): -acne -change in sex drive or performance -hair loss -headache This list may not describe all possible side effects. Call your doctor for medical advice about side effects. You may report side effects to FDA at 1-800-FDA-1088. Where should I keep my medicine? Keep out of the reach of children. This medicine can be abused. Keep your medicine in a safe place to protect it from theft. Do not share this medicine with anyone.  Selling or giving away this medicine is dangerous and against the law. Store at room temperature between 20 and 25 degrees C (68 and 77 degrees F). Do not freeze. Protect from light. Follow the directions for the product you are prescribed. Throw away any unused medicine after the expiration date. NOTE: This sheet is a summary. It may not cover all possible information. If you have questions about this medicine, talk to your doctor, pharmacist, or health care provider.    2016, [employer]/Gold Standard. (2013-03-18 07:62:26)

## 2014-10-25 ENCOUNTER — Ambulatory Visit: Payer: Medicare Other

## 2014-10-25 ENCOUNTER — Encounter: Payer: Self-pay | Admitting: Hematology & Oncology

## 2014-10-25 ENCOUNTER — Other Ambulatory Visit: Payer: Medicare Other

## 2014-10-25 ENCOUNTER — Encounter: Payer: Self-pay | Admitting: Cardiothoracic Surgery

## 2014-10-25 LAB — IRON AND TIBC CHCC
%SAT: 52 % (ref 20–55)
IRON: 76 ug/dL (ref 42–163)
TIBC: 146 ug/dL — AB (ref 202–409)
UIBC: 70 ug/dL — AB (ref 117–376)

## 2014-10-25 LAB — FERRITIN CHCC: Ferritin: 4071 ng/ml — ABNORMAL HIGH (ref 22–316)

## 2014-10-26 ENCOUNTER — Ambulatory Visit: Payer: Medicare Other

## 2014-10-26 LAB — CULTURE, BLOOD (ROUTINE X 2)
Culture: NO GROWTH
Culture: NO GROWTH

## 2014-10-27 ENCOUNTER — Ambulatory Visit: Payer: Medicare Other

## 2014-10-27 ENCOUNTER — Other Ambulatory Visit: Payer: Self-pay | Admitting: *Deleted

## 2014-10-27 DIAGNOSIS — B029 Zoster without complications: Secondary | ICD-10-CM

## 2014-10-27 DIAGNOSIS — D46Z Other myelodysplastic syndromes: Secondary | ICD-10-CM

## 2014-10-27 MED ORDER — FAMCICLOVIR 500 MG PO TABS: 500.0000 mg | ORAL_TABLET | Freq: Every day | ORAL | Status: DC

## 2014-10-28 ENCOUNTER — Ambulatory Visit: Payer: Medicare Other

## 2014-10-28 ENCOUNTER — Other Ambulatory Visit: Payer: Self-pay | Admitting: *Deleted

## 2014-10-28 DIAGNOSIS — D46Z Other myelodysplastic syndromes: Secondary | ICD-10-CM

## 2014-10-31 ENCOUNTER — Other Ambulatory Visit: Payer: Medicare Other

## 2014-10-31 ENCOUNTER — Ambulatory Visit (HOSPITAL_COMMUNITY)
Admission: RE | Admit: 2014-10-31 | Discharge: 2014-10-31 | Disposition: A | Discharge status: Home / Self Care | Payer: Medicare Other | Source: Ambulatory Visit | Attending: Hematology & Oncology | Admitting: Hematology & Oncology

## 2014-10-31 ENCOUNTER — Other Ambulatory Visit (HOSPITAL_BASED_OUTPATIENT_CLINIC_OR_DEPARTMENT_OTHER): Payer: Medicare Other

## 2014-10-31 ENCOUNTER — Ambulatory Visit (HOSPITAL_BASED_OUTPATIENT_CLINIC_OR_DEPARTMENT_OTHER): Payer: Medicare Other

## 2014-10-31 ENCOUNTER — Ambulatory Visit: Payer: Medicare Other

## 2014-10-31 ENCOUNTER — Ambulatory Visit (HOSPITAL_BASED_OUTPATIENT_CLINIC_OR_DEPARTMENT_OTHER): Payer: Medicare Other | Admitting: Hematology & Oncology

## 2014-10-31 ENCOUNTER — Encounter: Payer: Self-pay | Admitting: Hematology & Oncology

## 2014-10-31 ENCOUNTER — Telehealth: Payer: Self-pay | Admitting: *Deleted

## 2014-10-31 ENCOUNTER — Ambulatory Visit: Payer: Medicare Other | Admitting: Hematology & Oncology

## 2014-10-31 VITALS — BP 123/69 | HR 96 | Temp 98.2°F | Resp 18

## 2014-10-31 VITALS — BP 115/62 | HR 77 | Temp 97.8°F | Resp 16 | Ht 70.0 in

## 2014-10-31 DIAGNOSIS — D469 Myelodysplastic syndrome, unspecified: Secondary | ICD-10-CM

## 2014-10-31 DIAGNOSIS — D46Z Other myelodysplastic syndromes: Secondary | ICD-10-CM

## 2014-10-31 DIAGNOSIS — D4620 Refractory anemia with excess of blasts, unspecified: Secondary | ICD-10-CM | POA: Insufficient documentation

## 2014-10-31 DIAGNOSIS — C9200 Acute myeloblastic leukemia, not having achieved remission: Secondary | ICD-10-CM | POA: Insufficient documentation

## 2014-10-31 DIAGNOSIS — Z5111 Encounter for antineoplastic chemotherapy: Secondary | ICD-10-CM | POA: Diagnosis present

## 2014-10-31 DIAGNOSIS — R0789 Other chest pain: Secondary | ICD-10-CM

## 2014-10-31 DIAGNOSIS — D649 Anemia, unspecified: Secondary | ICD-10-CM

## 2014-10-31 DIAGNOSIS — G8918 Other acute postprocedural pain: Secondary | ICD-10-CM

## 2014-10-31 DIAGNOSIS — D61818 Other pancytopenia: Secondary | ICD-10-CM

## 2014-10-31 LAB — MANUAL DIFFERENTIAL (CHCC SATELLITE)
ALC: 0.5 10*3/uL — AB (ref 0.9–3.3)
ANC (CHCC MAN DIFF): 1.2 10*3/uL — AB (ref 1.5–6.5)
LYMPH: 26 % (ref 14–48)
MONO: 6 % (ref 0–13)
PLT EST ~~LOC~~: DECREASED
RBC Comments: NORMAL
SEG: 68 % (ref 40–75)

## 2014-10-31 LAB — CMP (CANCER CENTER ONLY)
ALK PHOS: 76 U/L (ref 26–84)
ALT: 18 U/L (ref 10–47)
AST: 19 U/L (ref 11–38)
Albumin: 3 g/dL — ABNORMAL LOW (ref 3.3–5.5)
BUN: 16 mg/dL (ref 7–22)
CO2: 25 mEq/L (ref 18–33)
CREATININE: 1.1 mg/dL (ref 0.6–1.2)
Calcium: 8.5 mg/dL (ref 8.0–10.3)
Chloride: 104 mEq/L (ref 98–108)
GLUCOSE: 107 mg/dL (ref 73–118)
POTASSIUM: 3.7 meq/L (ref 3.3–4.7)
SODIUM: 134 meq/L (ref 128–145)
TOTAL PROTEIN: 7.5 g/dL (ref 6.4–8.1)
Total Bilirubin: 0.7 mg/dl (ref 0.20–1.60)

## 2014-10-31 LAB — CBC WITH DIFFERENTIAL (CANCER CENTER ONLY)
HCT: 17.8 % — ABNORMAL LOW (ref 38.7–49.9)
HGB: 5.8 g/dL — CL (ref 13.0–17.1)
MCH: 28.4 pg (ref 28.0–33.4)
MCHC: 32.6 g/dL (ref 32.0–35.9)
MCV: 87 fL (ref 82–98)
PLATELETS: 19 10*3/uL — AB (ref 145–400)
RBC: 2.04 10*6/uL — AB (ref 4.20–5.70)
RDW: 13.4 % (ref 11.1–15.7)
WBC: 1.8 10*3/uL — AB (ref 4.0–10.0)

## 2014-10-31 LAB — PREPARE RBC (CROSSMATCH)

## 2014-10-31 LAB — HOLD TUBE, BLOOD BANK - CHCC SATELLITE

## 2014-10-31 MED ORDER — SODIUM CHLORIDE 0.9 % IJ SOLN
10.0000 mL | INTRAMUSCULAR | Status: DC | PRN
  Administered 2014-10-31: 10 mL
  Filled 2014-10-31: qty 10

## 2014-10-31 MED ORDER — LACTATED RINGERS IV SOLN
95.0000 mg/m2 | Freq: Every day | INTRAVENOUS | Status: DC
  Administered 2014-10-31: 200 mg via INTRAVENOUS
  Filled 2014-10-31: qty 40

## 2014-10-31 MED ORDER — SODIUM CHLORIDE 0.9 % IV SOLN
Freq: Once | INTRAVENOUS | Status: AC
  Administered 2014-10-31: 12:00:00 via INTRAVENOUS
  Filled 2014-10-31: qty 4

## 2014-10-31 MED ORDER — FAMOTIDINE IN NACL 20-0.9 MG/50ML-% IV SOLN
INTRAVENOUS | Status: AC
  Filled 2014-10-31: qty 100

## 2014-10-31 MED ORDER — METHYLPREDNISOLONE SODIUM SUCC 125 MG IJ SOLR
INTRAMUSCULAR | Status: AC
  Filled 2014-10-31: qty 2

## 2014-10-31 MED ORDER — SODIUM CHLORIDE 0.9 % IV SOLN
Freq: Once | INTRAVENOUS | Status: AC
  Administered 2014-10-31: 11:00:00 via INTRAVENOUS

## 2014-10-31 MED ORDER — METHYLPREDNISOLONE SODIUM SUCC 125 MG IJ SOLR
60.0000 mg | Freq: Once | INTRAMUSCULAR | Status: AC
  Administered 2014-10-31: 60 mg via INTRAVENOUS

## 2014-10-31 MED ORDER — FAMOTIDINE IN NACL 20-0.9 MG/50ML-% IV SOLN
40.0000 mg | Freq: Two times a day (BID) | INTRAVENOUS | Status: DC
  Administered 2014-10-31: 40 mg via INTRAVENOUS

## 2014-10-31 MED ORDER — HEPARIN SOD (PORK) LOCK FLUSH 100 UNIT/ML IV SOLN
500.0000 [IU] | Freq: Once | INTRAVENOUS | Status: AC | PRN
  Administered 2014-10-31: 500 [IU]
  Filled 2014-10-31: qty 5

## 2014-10-31 MED ORDER — LIDOCAINE 5 % EX PTCH: 1.0000 | MEDICATED_PATCH | CUTANEOUS | Status: DC

## 2014-10-31 NOTE — Progress Notes (Signed)
Hematology and Oncology Follow Up Visit  [name] [id number] 17-Nov-1945 69 y.o. 10/31/2014   Principle Diagnosis:   RAEB-2 (normal cytogenetics with mutated ASXL-1, TET2, U2AF1 genes)  Herpes zoster of the right T9 dermatome  Recurrent spontaneous left pneumothorax-status post VATS repair  Hypotestosteronemia  Current Therapy:    Dose intense Vidaza-7 day cycles. To start today  Aranesp 500 g subcutaneous every week needed for hemoglobin less than 10     Interim History:  [name] is back for follow-up.the shingles appears to be much better. It is drying up. He is having some pain in the area. I may want to try a Lidoderm patch for him.  He says has appetite is doing better. Hopefully this will help him out.  He doesn't feel too tired. He's had no bleeding. He's had no fever.  He has had no shortness of breath. He did see the thoracic surgeon. A repeat chest x-ray did not show any recurrence of the left pneumothorax.  He's had no bleeding.  He's had no leg swelling.  His been about 2 months since she's had therapy. We have tried to get him started but the pneumothorax issue interfered and also the fact that he had the shingles.  Mentally, he is ready for treatment.  Overall, his performance status is ECOG 2.  Medications:  Current outpatient prescriptions:  .  acetaminophen (TYLENOL) 500 MG tablet, Take 500 mg by mouth every 6 (six) hours as needed for fever., Disp: , Rfl:  .  ALPRAZolam (XANAX) 0.25 MG tablet, Take 1 tablet (0.25 mg total) by mouth 3 (three) times daily as needed for anxiety., Disp: 60 tablet, Rfl: 0 .  aspirin EC 81 MG tablet, Take 1 tablet (81 mg total) by mouth daily., Disp: , Rfl:  .  carboxymethylcellulose (REFRESH TEARS) 0.5 % SOLN, Place 2 drops into both eyes 3 (three) times daily as needed (For dry eyes.)., Disp: , Rfl:  .  diphenhydrAMINE (BENADRYL) 25 mg capsule, Take 1 capsule (25 mg total) by mouth every 6 (six) hours as needed  for itching. May take with Percocet PRN, Disp: 30 capsule, Rfl: 0 .  famciclovir (FAMVIR) 500 MG tablet, Take 1 tablet (500 mg total) by mouth daily., Disp: 30 tablet, Rfl: 6 .  feeding supplement, ENSURE ENLIVE, (ENSURE ENLIVE) LIQD, Take 237 mLs by mouth 2 (two) times daily between meals., Disp: 237 mL, Rfl: 12 .  HYDROcodone-acetaminophen (NORCO/VICODIN) 5-325 MG tablet, Take 1 tablet by mouth every 6 (six) hours as needed for moderate pain., Disp: , Rfl:  .  levofloxacin (LEVAQUIN) 500 MG tablet, , Disp: , Rfl:  .  lidocaine-prilocaine (EMLA) cream, Apply 1 application topically as needed. Please dispense 2 tubes, Disp: 60 g, Rfl: 3 .  LORazepam (ATIVAN) 0.5 MG tablet, Take 1 tablet (0.5 mg total) by mouth every 6 (six) hours as needed (Nausea or vomiting)., Disp: 30 tablet, Rfl: 3 .  metoprolol tartrate (LOPRESSOR) 25 MG tablet, Take 0.5 tablets (12.5 mg total) by mouth 2 (two) times daily., Disp: 90 tablet, Rfl: 3 .  nitroGLYCERIN (NITROSTAT) 0.4 MG SL tablet, Place 1 tablet (0.4 mg total) under the tongue every 5 (five) minutes as needed for chest pain., Disp: 25 tablet, Rfl: 6 .  ondansetron (ZOFRAN ODT) 4 MG disintegrating tablet, Take 1 tablet (4 mg total) by mouth every 8 (eight) hours as needed for nausea or vomiting., Disp: 20 tablet, Rfl: 0 .  ondansetron (ZOFRAN) 8 MG tablet, Take 1 tablet (8 mg  total) by mouth every 8 (eight) hours as needed for nausea or vomiting., Disp: 90 tablet, Rfl: 3 .  pantoprazole (PROTONIX) 40 MG tablet, Take 40 mg by mouth at bedtime. , Disp: , Rfl:  .  Potassium Chloride 40 MEQ/15ML (20%) SOLN, Take 40 mEq by mouth 2 (two) times daily., Disp: 1 Bottle, Rfl: 2 .  potassium chloride SA (K-DUR,KLOR-CON) 20 MEQ tablet, Take 2 tablets (40 mEq total) by mouth 2 (two) times daily., Disp: 120 tablet, Rfl: 3 .  predniSONE (DELTASONE) 20 MG tablet, , Disp: , Rfl:  .  simvastatin (ZOCOR) 20 MG tablet, Take 1 tablet (20 mg total) by mouth at bedtime., Disp: 30 tablet,  Rfl: 5 .  temazepam (RESTORIL) 15 MG capsule, Take 15 mg by mouth at bedtime as needed for sleep., Disp: , Rfl:  .  testosterone cypionate (DEPOTESTOTERONE CYPIONATE) 100 MG/ML injection, Inject 400 mg into the muscle every 28 (twenty-eight) days. For IM use only, Disp: , Rfl:  .  thiamine 100 MG tablet, Take 1 tablet (100 mg total) by mouth daily., Disp: 30 tablet, Rfl: 0 No current facility-administered medications for this visit.  Facility-Administered Medications Ordered in Other Visits:  .  azaCITIDine (VIDAZA) 200 mg in lactated ringers 60 mL chemo infusion, 95 mg/m2 (Treatment Plan Actual), Intravenous, Daily, Volanda Napoleon, MD, Stopped at 10/31/14 1406 .  famotidine (PEPCID) IVPB 20 mg premix, 40 mg, Intravenous, Q12H, Volanda Napoleon, MD, 40 mg at 10/31/14 1147 .  sodium chloride 0.9 % injection 10 mL, 10 mL, Intracatheter, PRN, Volanda Napoleon, MD, 10 mL at 10/31/14 1652  Allergies:  Allergies  Allergen Reactions  . Albuterol-Ipratropium-Soybean Lecithin [Ipratropium-Albuterol] Anxiety  . Cephalexin Other (See Comments)    Nausea and stomach cramps  . Compazine [Prochlorperazine] Anxiety  . Percocet [Oxycodone-Acetaminophen] Itching    Can take with benadryl    . Phenergan [Promethazine Hcl] Other (See Comments)    "restless legs"  . Tramadol Other (See Comments)    "jerking limbs and talking in his sleep"  . Vicodin [Hydrocodone-Acetaminophen] Itching    Can take with benadryl   . Marinol [Dronabinol] Other (See Comments)    "Affects his eyes"  . Reglan [Metoclopramide] Other (See Comments)    Patient has significant shakes that are troubling. Would not like to receive this if there are other options available.    Past Medical History, Surgical history, Social history, and Family History were reviewed and updated.  Review of Systems: As above  Physical Exam:  height is 5\' 10"  (1.778 m). His oral temperature is 97.8 F (36.6 C). His blood pressure is 115/62 and his  pulse is 77. His respiration is 16.   Wt Readings from Last 3 Encounters:  10/24/14 172 lb (78.019 kg)  10/21/14 170 lb (77.111 kg)  10/17/14 175 lb 14.4 oz (79.788 kg)     Well-developed and well-nourished white gentleman in no obvious distress. Head and neck exam shows no ocular or oral lesions. There are no palpable cervical or supraclavicular lymph does. Lungs are clear. Cardiac exam regular rate and rhythm with no murmurs, rubs or bruits. Abdomen is soft. Has good bowel sounds. There is no fluid wave. There is no palpable liver or spleen tip. Back exam shows no tenderness over the spine, ribs or hips. Extremities shows no clubbing, cyanosis or edema. Skin exam shows the herpetic rash in the right T9 dermatome. He has drying of the vesicles. He has some bleeding into the vesicles. Neurological exam is  nonfocal.  Lab Results  Component Value Date   WBC 1.8* 10/31/2014   HGB 5.8* 10/31/2014   HCT 17.8* 10/31/2014   MCV 87 10/31/2014   PLT 19* 10/31/2014     Chemistry      Component Value Date/Time   NA 134 10/31/2014 0922   NA 137 10/21/2014 2000   NA 136 09/12/2014 1325   K 3.7 10/31/2014 0922   K 3.4* 10/21/2014 2000   K 3.7 09/12/2014 1325   CL 104 10/31/2014 0922   CL 107 10/21/2014 2000   CO2 25 10/31/2014 0922   CO2 21* 10/21/2014 2000   CO2 21* 09/12/2014 1325   BUN 16 10/31/2014 0922   BUN 27* 10/21/2014 2000   BUN 28.0* 09/12/2014 1325   CREATININE 1.1 10/31/2014 0922   CREATININE 1.25* 10/21/2014 2000   CREATININE 1.4* 09/12/2014 1325      Component Value Date/Time   CALCIUM 8.5 10/31/2014 0922   CALCIUM 7.9* 10/21/2014 2000   CALCIUM 8.9 09/12/2014 1325   ALKPHOS 76 10/31/2014 0922   ALKPHOS 58 10/21/2014 2000   ALKPHOS 79 09/12/2014 1325   AST 19 10/31/2014 0922   AST 29 10/21/2014 2000   AST 13 09/12/2014 1325   ALT 18 10/31/2014 0922   ALT 32 10/21/2014 2000   ALT 33 09/12/2014 1325   BILITOT 0.70 10/31/2014 0922   BILITOT 0.7 10/21/2014 2000    BILITOT 0.71 09/12/2014 1325         Impression and Plan: Mr. Romas is 69 year old gentleman with myelodysplasia. He has normal cytogenetics. We did do the NGS of his blood which showed some abnormalities. Again, these might indicate an increased risk of transformation but it is not definitive.  Again, he has had a really tough time with these pneumothoraces. He required surgery for the second one.  I think that we can start treatment finely. I think that this would be reasonable. He has not had treatment now proctor close to 2 months because of the pneumothoraces.  The recent shingles also was put is behind. However, his shingles appears to be much better.  We will go ahead and transfuse him with 2 units of red blood cells.  We are checking his iron studies. I suspect that he probably will need iron chelation at some point.  I spent about 45 minutes with he and his wife today.   Volanda Napoleon, MD 10/17/20165:34 PM

## 2014-10-31 NOTE — Telephone Encounter (Signed)
Hgb 5.6.  Dr. Marin Olp notified.  Orders pending.

## 2014-10-31 NOTE — Patient Instructions (Signed)

## 2014-11-01 ENCOUNTER — Ambulatory Visit (HOSPITAL_BASED_OUTPATIENT_CLINIC_OR_DEPARTMENT_OTHER): Payer: Medicare Other

## 2014-11-01 ENCOUNTER — Inpatient Hospital Stay (HOSPITAL_COMMUNITY)
Admission: EM | Admit: 2014-11-01 | Discharge: 2014-11-08 | DRG: 380 | Disposition: A | Discharge status: Home Health | Payer: Medicare Other | Attending: Internal Medicine | Admitting: Internal Medicine

## 2014-11-01 ENCOUNTER — Other Ambulatory Visit: Payer: Medicare Other

## 2014-11-01 ENCOUNTER — Ambulatory Visit: Payer: Medicare Other

## 2014-11-01 ENCOUNTER — Emergency Department (HOSPITAL_COMMUNITY): Payer: Medicare Other

## 2014-11-01 ENCOUNTER — Other Ambulatory Visit: Payer: Self-pay | Admitting: Cardiothoracic Surgery

## 2014-11-01 ENCOUNTER — Other Ambulatory Visit: Payer: Self-pay

## 2014-11-01 ENCOUNTER — Encounter (HOSPITAL_COMMUNITY): Payer: Self-pay

## 2014-11-01 ENCOUNTER — Encounter: Payer: Self-pay | Admitting: Hematology & Oncology

## 2014-11-01 ENCOUNTER — Other Ambulatory Visit: Payer: Self-pay | Admitting: *Deleted

## 2014-11-01 VITALS — BP 112/73 | HR 66 | Temp 97.9°F | Resp 18

## 2014-11-01 DIAGNOSIS — J449 Chronic obstructive pulmonary disease, unspecified: Secondary | ICD-10-CM | POA: Diagnosis present

## 2014-11-01 DIAGNOSIS — D4622 Refractory anemia with excess of blasts 2: Secondary | ICD-10-CM | POA: Diagnosis present

## 2014-11-01 DIAGNOSIS — R1013 Epigastric pain: Secondary | ICD-10-CM

## 2014-11-01 DIAGNOSIS — I2510 Atherosclerotic heart disease of native coronary artery without angina pectoris: Secondary | ICD-10-CM | POA: Diagnosis present

## 2014-11-01 DIAGNOSIS — D696 Thrombocytopenia, unspecified: Secondary | ICD-10-CM | POA: Insufficient documentation

## 2014-11-01 DIAGNOSIS — Z808 Family history of malignant neoplasm of other organs or systems: Secondary | ICD-10-CM

## 2014-11-01 DIAGNOSIS — D46Z Other myelodysplastic syndromes: Secondary | ICD-10-CM

## 2014-11-01 DIAGNOSIS — J9383 Other pneumothorax: Secondary | ICD-10-CM | POA: Diagnosis present

## 2014-11-01 DIAGNOSIS — Z8249 Family history of ischemic heart disease and other diseases of the circulatory system: Secondary | ICD-10-CM

## 2014-11-01 DIAGNOSIS — Z6824 Body mass index (BMI) 24.0-24.9, adult: Secondary | ICD-10-CM

## 2014-11-01 DIAGNOSIS — K1120 Sialoadenitis, unspecified: Secondary | ICD-10-CM | POA: Diagnosis present

## 2014-11-01 DIAGNOSIS — T447X5A Adverse effect of beta-adrenoreceptor antagonists, initial encounter: Secondary | ICD-10-CM | POA: Diagnosis present

## 2014-11-01 DIAGNOSIS — J939 Pneumothorax, unspecified: Secondary | ICD-10-CM | POA: Diagnosis present

## 2014-11-01 DIAGNOSIS — E43 Unspecified severe protein-calorie malnutrition: Secondary | ICD-10-CM | POA: Diagnosis present

## 2014-11-01 DIAGNOSIS — Z955 Presence of coronary angioplasty implant and graft: Secondary | ICD-10-CM

## 2014-11-01 DIAGNOSIS — Z79899 Other long term (current) drug therapy: Secondary | ICD-10-CM

## 2014-11-01 DIAGNOSIS — Z7982 Long term (current) use of aspirin: Secondary | ICD-10-CM

## 2014-11-01 DIAGNOSIS — D6481 Anemia due to antineoplastic chemotherapy: Secondary | ICD-10-CM | POA: Diagnosis present

## 2014-11-01 DIAGNOSIS — T451X5A Adverse effect of antineoplastic and immunosuppressive drugs, initial encounter: Secondary | ICD-10-CM | POA: Diagnosis present

## 2014-11-01 DIAGNOSIS — I252 Old myocardial infarction: Secondary | ICD-10-CM

## 2014-11-01 DIAGNOSIS — D469 Myelodysplastic syndrome, unspecified: Secondary | ICD-10-CM | POA: Diagnosis not present

## 2014-11-01 DIAGNOSIS — Z8679 Personal history of other diseases of the circulatory system: Secondary | ICD-10-CM

## 2014-11-01 DIAGNOSIS — R001 Bradycardia, unspecified: Secondary | ICD-10-CM | POA: Diagnosis present

## 2014-11-01 DIAGNOSIS — D61818 Other pancytopenia: Secondary | ICD-10-CM

## 2014-11-01 DIAGNOSIS — Z801 Family history of malignant neoplasm of trachea, bronchus and lung: Secondary | ICD-10-CM

## 2014-11-01 DIAGNOSIS — E785 Hyperlipidemia, unspecified: Secondary | ICD-10-CM | POA: Diagnosis present

## 2014-11-01 DIAGNOSIS — R1110 Vomiting, unspecified: Secondary | ICD-10-CM | POA: Insufficient documentation

## 2014-11-01 DIAGNOSIS — I10 Essential (primary) hypertension: Secondary | ICD-10-CM | POA: Diagnosis present

## 2014-11-01 DIAGNOSIS — R112 Nausea with vomiting, unspecified: Secondary | ICD-10-CM

## 2014-11-01 DIAGNOSIS — Z885 Allergy status to narcotic agent status: Secondary | ICD-10-CM

## 2014-11-01 DIAGNOSIS — F329 Major depressive disorder, single episode, unspecified: Secondary | ICD-10-CM | POA: Diagnosis present

## 2014-11-01 DIAGNOSIS — R221 Localized swelling, mass and lump, neck: Secondary | ICD-10-CM

## 2014-11-01 DIAGNOSIS — K219 Gastro-esophageal reflux disease without esophagitis: Secondary | ICD-10-CM | POA: Diagnosis present

## 2014-11-01 DIAGNOSIS — B029 Zoster without complications: Secondary | ICD-10-CM | POA: Diagnosis present

## 2014-11-01 DIAGNOSIS — K2210 Ulcer of esophagus without bleeding: Principal | ICD-10-CM | POA: Diagnosis present

## 2014-11-01 DIAGNOSIS — E876 Hypokalemia: Secondary | ICD-10-CM | POA: Diagnosis present

## 2014-11-01 DIAGNOSIS — D61810 Antineoplastic chemotherapy induced pancytopenia: Secondary | ICD-10-CM | POA: Diagnosis present

## 2014-11-01 DIAGNOSIS — Z8051 Family history of malignant neoplasm of kidney: Secondary | ICD-10-CM

## 2014-11-01 DIAGNOSIS — Z9049 Acquired absence of other specified parts of digestive tract: Secondary | ICD-10-CM

## 2014-11-01 DIAGNOSIS — M1990 Unspecified osteoarthritis, unspecified site: Secondary | ICD-10-CM | POA: Diagnosis present

## 2014-11-01 DIAGNOSIS — Z5111 Encounter for antineoplastic chemotherapy: Secondary | ICD-10-CM | POA: Diagnosis present

## 2014-11-01 DIAGNOSIS — K2970 Gastritis, unspecified, without bleeding: Secondary | ICD-10-CM | POA: Diagnosis present

## 2014-11-01 DIAGNOSIS — Z888 Allergy status to other drugs, medicaments and biological substances status: Secondary | ICD-10-CM

## 2014-11-01 DIAGNOSIS — B009 Herpesviral infection, unspecified: Secondary | ICD-10-CM | POA: Diagnosis present

## 2014-11-01 DIAGNOSIS — Z881 Allergy status to other antibiotic agents status: Secondary | ICD-10-CM

## 2014-11-01 DIAGNOSIS — B0223 Postherpetic polyneuropathy: Secondary | ICD-10-CM

## 2014-11-01 DIAGNOSIS — Z87891 Personal history of nicotine dependence: Secondary | ICD-10-CM

## 2014-11-01 DIAGNOSIS — F419 Anxiety disorder, unspecified: Secondary | ICD-10-CM | POA: Diagnosis present

## 2014-11-01 DIAGNOSIS — R1115 Cyclical vomiting syndrome unrelated to migraine: Secondary | ICD-10-CM

## 2014-11-01 DIAGNOSIS — E7800 Pure hypercholesterolemia, unspecified: Secondary | ICD-10-CM | POA: Diagnosis present

## 2014-11-01 DIAGNOSIS — G43A0 Cyclical vomiting, not intractable: Secondary | ICD-10-CM | POA: Diagnosis present

## 2014-11-01 MED ORDER — ONDANSETRON HCL 4 MG/2ML IJ SOLN
4.0000 mg | Freq: Once | INTRAMUSCULAR | Status: AC
  Administered 2014-11-01: 4 mg via INTRAVENOUS
  Filled 2014-11-01: qty 2

## 2014-11-01 MED ORDER — METHYLPREDNISOLONE SODIUM SUCC 125 MG IJ SOLR
60.0000 mg | Freq: Once | INTRAMUSCULAR | Status: AC
  Administered 2014-11-01: 60 mg via INTRAVENOUS

## 2014-11-01 MED ORDER — SODIUM CHLORIDE 0.9 % IJ SOLN
10.0000 mL | INTRAMUSCULAR | Status: DC | PRN
  Administered 2014-11-01: 10 mL
  Filled 2014-11-01: qty 10

## 2014-11-01 MED ORDER — ONDANSETRON 4 MG PO TBDP: 4.0000 mg | ORAL_TABLET | Freq: Three times a day (TID) | ORAL | Status: DC | PRN

## 2014-11-01 MED ORDER — FAMOTIDINE IN NACL 20-0.9 MG/50ML-% IV SOLN
INTRAVENOUS | Status: AC
  Filled 2014-11-01: qty 100

## 2014-11-01 MED ORDER — LIP MEDEX EX OINT
TOPICAL_OINTMENT | Freq: Once | CUTANEOUS | Status: AC
  Administered 2014-11-02: via TOPICAL
  Filled 2014-11-01: qty 7

## 2014-11-01 MED ORDER — FENTANYL CITRATE (PF) 100 MCG/2ML IJ SOLN
50.0000 ug | Freq: Once | INTRAMUSCULAR | Status: AC
  Administered 2014-11-01: 50 ug via INTRAVENOUS
  Filled 2014-11-01: qty 2

## 2014-11-01 MED ORDER — ONDANSETRON HCL 40 MG/20ML IJ SOLN
Freq: Once | INTRAMUSCULAR | Status: AC
  Administered 2014-11-01: 13:00:00 via INTRAVENOUS
  Filled 2014-11-01: qty 4

## 2014-11-01 MED ORDER — SODIUM CHLORIDE 0.9 % IV BOLUS (SEPSIS)
1000.0000 mL | Freq: Once | INTRAVENOUS | Status: AC
  Administered 2014-11-01: 1000 mL via INTRAVENOUS

## 2014-11-01 MED ORDER — SODIUM CHLORIDE 0.9 % IV SOLN
Freq: Once | INTRAVENOUS | Status: AC
  Administered 2014-11-01: 12:00:00 via INTRAVENOUS

## 2014-11-01 MED ORDER — FAMOTIDINE IN NACL 20-0.9 MG/50ML-% IV SOLN
40.0000 mg | Freq: Two times a day (BID) | INTRAVENOUS | Status: DC
  Administered 2014-11-01: 40 mg via INTRAVENOUS

## 2014-11-01 MED ORDER — LACTATED RINGERS IV SOLN
95.0000 mg/m2 | Freq: Every day | INTRAVENOUS | Status: DC
  Administered 2014-11-01: 200 mg via INTRAVENOUS
  Filled 2014-11-01: qty 40

## 2014-11-01 MED ORDER — HEPARIN SOD (PORK) LOCK FLUSH 100 UNIT/ML IV SOLN
500.0000 [IU] | Freq: Once | INTRAVENOUS | Status: AC | PRN
  Administered 2014-11-01: 500 [IU]
  Filled 2014-11-01: qty 5

## 2014-11-01 MED ORDER — LIDOCAINE-PRILOCAINE 2.5-2.5 % EX CREA
TOPICAL_CREAM | CUTANEOUS | Status: AC
  Administered 2014-11-02: via TOPICAL
  Filled 2014-11-01: qty 5

## 2014-11-01 MED ORDER — HYDROCODONE-ACETAMINOPHEN 5-325 MG PO TABS: 1.0000 | ORAL_TABLET | Freq: Four times a day (QID) | ORAL | Status: DC | PRN

## 2014-11-01 NOTE — ED Notes (Signed)
Patient arrives by EMS with complaints of severe nausea for the past 2 days.  Patient is currently being treated for bone cancer with aggressive radiation and chemo, which he received over the last 2 days.  Patient is pale and ill appearing, received Zofran, total 8 mg IV per EMS in 4 mg doses with no relief in the severe nausea.  EMS noted patient trying to induce vomiting himself in order to help relieve the nausea.  Patient has some depression due to illness, and treated for shingles 10/24/2014.  BP per EMS 156/66, CBG 183, 99% RA.  #20 angio left ACF per EMS.

## 2014-11-01 NOTE — ED Provider Notes (Signed)
CSN: [id number]     Arrival date & time 11/01/14  2301 History  By signing my name below, I, Meriel Pica, attest that this documentation has been prepared under the direction and in the presence of Naaman Curro, MD. Electronically Signed: Meriel Pica, ED Scribe. 11/01/2014. 12:19 AM.   Chief Complaint  Patient presents with  . Severe nausea   . Abdominal Pain   Patient is a 69 y.o. male presenting with vomiting. The history is provided by the EMS personnel and a relative. No language interpreter was used.  Emesis Severity:  Severe Timing:  Constant Progression:  Worsening Chronicity:  Recurrent Context: not post-tussive   Relieved by:  Nothing Exacerbated by: chemotherapy. Ineffective treatments:  Antiemetics Associated symptoms: abdominal pain   Abdominal pain:    Location:  Generalized   Severity:  Severe  HPI Comments: [name] is a 69 y.o. male, with a significant PMhx, brought in by ambulance, who presents to the Emergency Department complaining of progressively worsening, constant nausea with emesis onset 5 hours ago s/p first round of chemotherapy treatment in 2 months. Per daughter, the pt has been experiencing episodes of emesis since leaving the [facility] at 6 PM this evening where he received his first chemo treatment since August. Daughter reports pt with intermittent nausea for the past 7 months which has been worsening since a more aggressive chemotherapy treatment was started 2 months ago. There is associated generalized abdominal pain. The pt has a significant PMhx with a AAA repair 1 year ago and a current diagnosis of herpes zooster. His nausea has been unrelieved by Zofran. Daughter denies the pt to be taking any new medications except the recent treatment prescribed for shingles. Pt has also been intermittently non-verbal with AMS since becoming increasingly ill tonight; relatives report 3 episodes of AMS this evening.  He has been evaluated in the ED  in the past for AMS.   Past Medical History  Diagnosis Date  . Coronary atherosclerosis of native coronary artery     a. 10/2008 inf STEMI/PCI: LM 50d (IVUS-borderline lesion->med rx), LAD min irregs, LCX 19m, 70d, OM nl, RCA 153m (3.5x28 Vision BMS);  b. 11/2008 Lexiscan MV: EF 65%, no isch/scar.  . Essential hypertension, benign   . Pure hypercholesterolemia   . AAA (abdominal aortic aneurysm) ([place])     a. 05/2013 CT: 5.8 cm AAA.  Marland Kitchen Diverticulitis     a. 05/2013 CT: descending/sigmoid jxn w/o abscess.  . Osteoarthritis   . Tobacco abuse     a. ongoing - 1ppd for better part of 50 yrs.  . Normocytic anemia   . Cellulitis 06/10/2013    Right antecubital fossa at site of IV  03/12/13  . PONV (postoperative nausea and vomiting)   . Diverticulitis   . GERD (gastroesophageal reflux disease)   . MDS (myelodysplastic syndrome), high grade ([place]) 04/13/2014  . Myocardial infarction ([place]) 2010  . Depression   . Spontaneous pneumothorax 09/14/2014    LEFT LUNG  . Hypotestosteronemia 10/04/2014   Past Surgical History  Procedure Laterality Date  . Cardiac stents  2010  . Cholecystectomy N/A 06/19/2013    Procedure: LAPAROSCOPIC CHOLECYSTECTOMY;  Surgeon: Harl Bowie, MD;  Location: [place];  Service: General;  Laterality: N/A;  . Abdominal aortic endovascular stent graft N/A 07/02/2013    Procedure: ABDOMINAL AORTIC ENDOVASCULAR STENT GRAFT;  Surgeon: Serafina Mitchell, MD;  Location: [place];  Service: Vascular;  Laterality: N/A;  . Esophagogastroduodenoscopy N/A 08/05/2013  Procedure: ESOPHAGOGASTRODUODENOSCOPY (EGD);  Surgeon: Lear Ng, MD;  Location[facility] ENDOSCOPY;  Service: Endoscopy;  Laterality: N/A;  . Eye surgery Left     cataract surgery  . Back surgery      cervical fusion  . Colonoscopy with propofol N/A 10/15/2013    Procedure: COLONOSCOPY WITH PROPOFOL;  Surgeon: Lear Ng, MD;  Location: [place];  Service: Endoscopy;  Laterality: N/A;  . Fetal blood  transfusion  March 16,17,18, 2016  . Bone marrow biopsy  April 01, 2014  . Chest tube insertion  09/14/2014  . Video assisted thoracoscopy Left 10/11/2014    Procedure: VIDEO ASSISTED THORACOSCOPY;  Surgeon: Ivin Poot, MD;  Location: [place];  Service: Thoracic;  Laterality: Left;  . Stapling of blebs Left 10/11/2014    Procedure: STAPLING OF BLEBS;  Surgeon: Ivin Poot, MD;  Location: [facility] OR;  Service: Thoracic;  Laterality: Left;   Family History  Problem Relation Age of Onset  . Heart attack Brother     17s  . Cancer Father     Lung  . Cancer Mother     Brain  . Cancer Brother     Kidney   Social History  Substance Use Topics  . Smoking status: Former Smoker -- 1.00 packs/day for 60 years    Types: Cigarettes    Start date: 04/05/1964  . Smokeless tobacco: Never Used     Comment: 09/14/2014  . Alcohol Use: No    Review of Systems  Constitutional: Negative for fever.  Gastrointestinal: Positive for vomiting and abdominal pain.  All other systems reviewed and are negative.  Allergies  Albuterol-ipratropium-soybean lecithin; Cephalexin; Compazine; Percocet; Phenergan; Tramadol; Vicodin; Marinol; and Reglan  Home Medications   Prior to Admission medications   Medication Sig Start Date End Date Taking? Authorizing Provider  acetaminophen (TYLENOL) 500 MG tablet Take 500 mg by mouth every 6 (six) hours as needed for fever.    Historical Provider, MD  ALPRAZolam Duanne Moron) 0.25 MG tablet Take 1 tablet (0.25 mg total) by mouth 3 (three) times daily as needed for anxiety. 10/03/14   Volanda Napoleon, MD  aspirin EC 81 MG tablet Take 1 tablet (81 mg total) by mouth daily. 03/30/14   Sherren Mocha, MD  carboxymethylcellulose (REFRESH TEARS) 0.5 % SOLN Place 2 drops into both eyes 3 (three) times daily as needed (For dry eyes.).    Historical Provider, MD  diphenhydrAMINE (BENADRYL) 25 mg capsule Take 1 capsule (25 mg total) by mouth every 6 (six) hours as needed for itching. May  take with Percocet PRN 10/17/14   Donielle Liston Alba, PA-C  famciclovir (FAMVIR) 500 MG tablet Take 1 tablet (500 mg total) by mouth daily. 10/27/14   Volanda Napoleon, MD  feeding supplement, ENSURE ENLIVE, (ENSURE ENLIVE) LIQD Take 237 mLs by mouth 2 (two) times daily between meals. 10/17/14   Donielle Liston Alba, PA-C  HYDROcodone-acetaminophen (NORCO/VICODIN) 5-325 MG tablet Take 1 tablet by mouth every 6 (six) hours as needed for moderate pain. 11/01/14   Eliezer Bottom, NP  levofloxacin (LEVAQUIN) 500 MG tablet  10/17/14   Historical Provider, MD  lidocaine (LIDODERM) 5 % Place 1 patch onto the skin daily. Remove & Discard patch within 12 hours or as directed by MD 10/31/14   Volanda Napoleon, MD  lidocaine-prilocaine (EMLA) cream Apply 1 application topically as needed. Please dispense 2 tubes 10/12/14   Volanda Napoleon, MD  LORazepam (ATIVAN) 0.5 MG tablet Take 1 tablet (  0.5 mg total) by mouth every 6 (six) hours as needed (Nausea or vomiting). 04/11/14   Volanda Napoleon, MD  metoprolol tartrate (LOPRESSOR) 25 MG tablet Take 0.5 tablets (12.5 mg total) by mouth 2 (two) times daily. 08/03/13   Sherren Mocha, MD  nitroGLYCERIN (NITROSTAT) 0.4 MG SL tablet Place 1 tablet (0.4 mg total) under the tongue every 5 (five) minutes as needed for chest pain. 03/29/14   Sherren Mocha, MD  ondansetron (ZOFRAN ODT) 4 MG disintegrating tablet Take 1 tablet (4 mg total) by mouth every 8 (eight) hours as needed for nausea or vomiting. 11/01/14   Volanda Napoleon, MD  ondansetron (ZOFRAN) 8 MG tablet Take 1 tablet (8 mg total) by mouth every 8 (eight) hours as needed for nausea or vomiting. 10/18/14   Volanda Napoleon, MD  pantoprazole (PROTONIX) 40 MG tablet Take 40 mg by mouth at bedtime.  08/06/13   Nita Sells, MD  Potassium Chloride 40 MEQ/15ML (20%) SOLN Take 40 mEq by mouth 2 (two) times daily. 10/19/14   Volanda Napoleon, MD  potassium chloride SA (K-DUR,KLOR-CON) 20 MEQ tablet Take 2 tablets (40 mEq  total) by mouth 2 (two) times daily. 10/20/14   Volanda Napoleon, MD  predniSONE (DELTASONE) 20 MG tablet  10/17/14   Historical Provider, MD  simvastatin (ZOCOR) 20 MG tablet Take 1 tablet (20 mg total) by mouth at bedtime. 03/30/14   Sherren Mocha, MD  temazepam (RESTORIL) 15 MG capsule Take 15 mg by mouth at bedtime as needed for sleep.    Historical Provider, MD  testosterone cypionate (DEPOTESTOTERONE CYPIONATE) 100 MG/ML injection Inject 400 mg into the muscle every 28 (twenty-eight) days. For IM use only    Historical Provider, MD  thiamine 100 MG tablet Take 1 tablet (100 mg total) by mouth daily. 10/17/14   Donielle Liston Alba, PA-C   BP 134/74 mmHg  Pulse 64  Resp 18  SpO2 100% Physical Exam  Constitutional: He is oriented to person, place, and time. He appears well-developed and well-nourished. No distress.  HENT:  Head: Normocephalic.  Right Ear: External ear normal.  Left Ear: External ear normal.  Mouth/Throat: Oropharynx is clear and moist. No oropharyngeal exudate.  Moist mucous membranes, no exudate; trachea midline.   Eyes: Conjunctivae and EOM are normal. Pupils are equal, round, and reactive to light.  Cardiovascular: Normal rate, regular rhythm and normal heart sounds.   No murmur heard. Pulmonary/Chest: Effort normal and breath sounds normal. No respiratory distress. He has no wheezes. He has no rales.  Abdominal: Soft. Bowel sounds are normal. He exhibits no distension and no mass. There is no tenderness. There is no rebound and no guarding.  Neurological: He is alert and oriented to person, place, and time. He has normal reflexes. No cranial nerve deficit. Coordination normal.  Skin: No rash noted. No erythema. No pallor.  Psychiatric: He has a normal mood and affect.    ED Course  Procedures  DIAGNOSTIC STUDIES: Oxygen Saturation is 100% on RA, normal by my interpretation.    COORDINATION OF CARE: 11:32 PM Discussed treatment plan with pt at bedside and pt  agreed to plan.   Labs Review Labs Reviewed  I-STAT CHEM 8, ED - Abnormal; Notable for the following:    BUN 27 (*)    Glucose, Bld 149 (*)    Calcium, Ion 1.10 (*)    Hemoglobin 7.5 (*)    HCT 22.0 (*)    All other components within normal limits  CULTURE, BLOOD (ROUTINE X 2)  CULTURE, BLOOD (ROUTINE X 2)  CBC WITH DIFFERENTIAL/PLATELET  URINALYSIS, ROUTINE W REFLEX MICROSCOPIC (NOT AT [facility])  LIPASE, BLOOD  HEPATIC FUNCTION PANEL  I-STAT CG4 LACTIC ACID, ED    Imaging Review Ct Head Wo Contrast  11/02/2014  CLINICAL DATA:  Altered mental status. Episodes of verbal unresponsiveness. Severe nausea. EXAM: CT HEAD WITHOUT CONTRAST TECHNIQUE: Contiguous axial images were obtained from the base of the skull through the vertex without intravenous contrast. COMPARISON:  None. FINDINGS: Mild generalized cerebral and cerebellar atrophy.No intracranial hemorrhage, mass effect, or midline shift. No hydrocephalus. The basilar cisterns are patent. No evidence of territorial infarct. No intracranial fluid collection. Calvarium is intact. Included paranasal sinuses and mastoid air cells are well aerated. IMPRESSION: Mild atrophy.  No acute intracranial abnormality. Electronically Signed   By: Jeb Levering M.D.   On: 11/02/2014 00:50   Ct Abdomen Pelvis W Contrast  11/02/2014  CLINICAL DATA:  Acute onset of nausea and vomiting, status post recent chemotherapy. Known bone metastases. Generalized abdominal pain and neutropenia. Initial encounter. EXAM: CT ABDOMEN AND PELVIS WITH CONTRAST TECHNIQUE: Multidetector CT imaging of the abdomen and pelvis was performed using the standard protocol following bolus administration of intravenous contrast. CONTRAST:  145mL OMNIPAQUE IOHEXOL 300 MG/ML  SOLN COMPARISON:  CT of the chest, abdomen and pelvis performed 03/30/2014 FINDINGS: Minimal left basilar scarring is noted. Scattered coronary artery calcifications are seen. The liver is unremarkable in appearance.  The spleen is enlarged, measuring 15.7 cm in length. The patient is status post cholecystectomy, with clips noted at the gallbladder fossa. The pancreas and adrenal glands are unremarkable. Small bilateral renal cysts are seen, measuring up to 2.3 cm in size. The kidneys are otherwise unremarkable in appearance. There is no evidence of hydronephrosis. No renal or ureteral stones are seen. No perinephric stranding is appreciated. No free fluid is identified. The small bowel is unremarkable in appearance. The stomach is within normal limits. No acute vascular abnormalities are seen. An IVC filter is noted. The patient's aortoiliac stent graft is grossly unremarkable in appearance. The appendix is normal in caliber, without evidence of appendicitis. Scattered diverticulosis is noted along the descending and sigmoid colon, without evidence of diverticulitis. The bladder is mildly distended and grossly unremarkable. The prostate is enlarged, measuring 5.3 cm in transverse dimension. No inguinal lymphadenopathy is seen. No acute osseous abnormalities are identified. Vacuum phenomenon is noted at L5-S1, with endplate sclerotic change. IMPRESSION: 1. No acute abnormality seen to explain the patient's symptoms. 2. Scattered diverticulosis along the descending and sigmoid colon, without evidence of diverticulitis. 3. Aortoiliac stent graft is grossly unremarkable in appearance. 4. Small bilateral renal cysts seen. 5. Scattered coronary artery calcifications noted. 6. Splenomegaly noted. 7. Enlarged prostate seen. Electronically Signed   By: Garald Balding M.D.   On: 11/02/2014 02:54   Dg Abd Acute W/chest  11/02/2014  CLINICAL DATA:  Acute onset of severe nausea. Patient on radiation therapy and chemotherapy for bone cancer. Initial encounter. EXAM: DG ABDOMEN ACUTE W/ 1V CHEST COMPARISON:  Chest radiograph performed 10/21/2014, and abdominal radiograph performed 10/12/2014 FINDINGS: The lungs are well-aerated. Mild  left-sided airspace opacity could reflect a loculated effusion or mild infection. There is no evidence of pneumothorax. The cardiomediastinal silhouette is borderline enlarged. A right-sided chest port is noted ending about the distal SVC. The visualized bowel gas pattern is unremarkable. Scattered stool and air are seen within the colon; there is no evidence of small bowel dilatation to  suggest obstruction. No free intra-abdominal air is identified on the provided decubitus view. No acute osseous abnormalities are seen; the sacroiliac joints are unremarkable in appearance. An IVC filter is noted. An aortoiliac stent graft is seen. Clips are noted within the right upper quadrant, reflecting prior cholecystectomy. IMPRESSION: 1. Unremarkable bowel gas pattern; no free intra-abdominal air seen. 2. Mild left-sided airspace opacity could reflect a loculated pleural effusion or mild infection, slightly less prominent than on the recent chest radiograph. 3. Borderline cardiomegaly. Electronically Signed   By: Garald Balding M.D.   On: 11/02/2014 01:02   I have personally reviewed and evaluated these images and lab results as part of my medical decision-making.   MDM   Final diagnoses:  None     Date: 11/02/2014  Rate: 73  Rhythm: normal sinus rhythm  QRS Axis: normal  Intervals: normal  ST/T Wave abnormalities: normal  Conduction Disutrbances: none  Narrative Interpretation: unremarkable   Results for orders placed or performed during the hospital encounter of 11/01/14  CBC with Differential/Platelet  Result Value Ref Range   WBC 0.9 (LL) 4.0 - 10.5 K/uL   RBC 2.61 (L) 4.22 - 5.81 MIL/uL   Hemoglobin 7.3 (L) 13.0 - 17.0 g/dL   HCT 21.6 (L) 39.0 - 52.0 %   MCV 82.8 78.0 - 100.0 fL   MCH 28.0 26.0 - 34.0 pg   MCHC 33.8 30.0 - 36.0 g/dL   RDW 13.9 11.5 - 15.5 %   Platelets 17 (LL) 150 - 400 K/uL   Neutrophils Relative % 80 %   Neutro Abs 0.7 (L) 1.7 - 7.7 K/uL   Lymphocytes Relative 16 %    Lymphs Abs 0.2 (L) 0.7 - 4.0 K/uL   Monocytes Relative 3 %   Monocytes Absolute 0.0 (L) 0.1 - 1.0 K/uL   Eosinophils Relative 0 %   Eosinophils Absolute 0.0 0.0 - 0.7 K/uL   Basophils Relative 0 %   Basophils Absolute 0.0 0.0 - 0.1 K/uL  Urinalysis, Routine w reflex microscopic (not at [facility])  Result Value Ref Range   Color, Urine YELLOW YELLOW   APPearance CLEAR CLEAR   Specific Gravity, Urine 1.017 1.005 - 1.030   pH 6.0 5.0 - 8.0   Glucose, UA NEGATIVE NEGATIVE mg/dL   Hgb urine dipstick NEGATIVE NEGATIVE   Bilirubin Urine NEGATIVE NEGATIVE   Ketones, ur NEGATIVE NEGATIVE mg/dL   Protein, ur NEGATIVE NEGATIVE mg/dL   Urobilinogen, UA 1.0 0.0 - 1.0 mg/dL   Nitrite NEGATIVE NEGATIVE   Leukocytes, UA NEGATIVE NEGATIVE  Lipase, blood  Result Value Ref Range   Lipase 29 22 - 51 U/L  Hepatic function panel  Result Value Ref Range   Total Protein 7.8 6.5 - 8.1 g/dL   Albumin 3.1 (L) 3.5 - 5.0 g/dL   AST 22 15 - 41 U/L   ALT 15 (L) 17 - 63 U/L   Alkaline Phosphatase 78 38 - 126 U/L   Total Bilirubin 0.6 0.3 - 1.2 mg/dL   Bilirubin, Direct 0.2 0.1 - 0.5 mg/dL   Indirect Bilirubin 0.4 0.3 - 0.9 mg/dL  I-stat chem 8, ed  Result Value Ref Range   Sodium 139 135 - 145 mmol/L   Potassium 4.2 3.5 - 5.1 mmol/L   Chloride 105 101 - 111 mmol/L   BUN 27 (H) 6 - 20 mg/dL   Creatinine, Ser 1.20 0.61 - 1.24 mg/dL   Glucose, Bld 149 (H) 65 - 99 mg/dL   Calcium, Ion 1.10 (L)  1.13 - 1.30 mmol/L   TCO2 21 0 - 100 mmol/L   Hemoglobin 7.5 (L) 13.0 - 17.0 g/dL   HCT 22.0 (L) 39.0 - 52.0 %  I-Stat CG4 Lactic Acid, ED  Result Value Ref Range   Lactic Acid, Venous 1.42 0.5 - 2.0 mmol/L  I-stat troponin, ED  Result Value Ref Range   Troponin i, poc 0.01 0.00 - 0.08 ng/mL   Comment 3          I-Stat CG4 Lactic Acid, ED  Result Value Ref Range   Lactic Acid, Venous 0.40 (L) 0.5 - 2.0 mmol/L   Dg Chest 2 View  10/21/2014  CLINICAL DATA:  Altered mental status. EXAM: CHEST  2 VIEW COMPARISON:   October 17, 2014. FINDINGS: The heart size and mediastinal contours are within normal limits. Right internal jugular Port-A-Cath is stable with distal tip in expected position of SVC. Stable mild pleural thickening or loculated pleural effusion is noted in left hemithorax. Left internal jugular catheter noted on prior exam has been removed. No pneumothorax is noted. Right lung is clear. The visualized skeletal structures are unremarkable. IMPRESSION: Stable mild pleural thickening or loculated pleural effusion is noted involving the left hemithorax. No significant changes noted compared to prior exam. Electronically Signed   By: Marijo Conception, M.D.   On: 10/21/2014 20:29   Dg Chest 2 View  10/17/2014  CLINICAL DATA:  Status post thoracostomy with bleb stapling on October 11, 2014 EXAM: CHEST  2 VIEW COMPARISON:  Portable chest x-ray of October 16, 2014. FINDINGS: The lungs are well-expanded. There is persistent left apical soft tissue density. Pleural fluid or pleural thickening persists along the left lateral thoracic wall. No pneumothorax is evident. The interstitial markings of the left lung remain mildly increased. The right lung is well-expanded without focal abnormality. The heart and pulmonary vascularity are normal. There is stable tortuosity of the descending thoracic aorta. The power port appliance tip projects over junction of the middle and distal thirds of the SVC. The left internal jugular venous catheter tip projects over the junction of the right and left brachiocephalic veins. IMPRESSION: Stable appearance of the chest since the earlier study. There is no recurrent pneumothorax. Persistent left apical opacity with stable mildly increased interstitial markings predominantly on the left. Electronically Signed   By: David  Martinique M.D.   On: 10/17/2014 07:59   X-ray Chest Pa And Lateral  10/10/2014  CLINICAL DATA:  Chest tube placed last night. EXAM: CHEST  2 VIEW COMPARISON:  10/09/2014  FINDINGS: LEFT chest tube in place. Small LEFT apical pneumothorax is noted with the pleural edge 12 mm from the apical chest wall unchanged from prior. There is mild atelectasis at the LEFT lung base. RIGHT lung is clear. RIGHT power port noted. IMPRESSION: 1. Small LEFT apical pneumothorax with chest tube in place. These results will be called to the ordering clinician or representative by the Radiologist Assistant, and communication documented in the PACS or zVision Dashboard. Electronically Signed   By: Suzy Bouchard M.D.   On: 10/10/2014 09:05   Dg Chest 2 View  10/04/2014  CLINICAL DATA:  History of spontaneous left pneumothorax. EXAM: CHEST  2 VIEW COMPARISON:  09/20/2014 and previous radiographs. FINDINGS: Cardiomediastinal silhouette is normal. Mediastinal contours appear intact. There is no evidence of focal airspace consolidation, pleural effusion or pneumothorax. Subtle linear opacity overlying the left lung apex may represent subpleural scarring. Osseous structures are without acute abnormality. Soft tissues are grossly normal. IMPRESSION:  No evidence of recurrent left pneumothorax. Subtle left apical subpleural scarring. Electronically Signed   By: Fidela Salisbury M.D.   On: 10/04/2014 09:34   Ct Head Wo Contrast  11/02/2014  CLINICAL DATA:  Altered mental status. Episodes of verbal unresponsiveness. Severe nausea. EXAM: CT HEAD WITHOUT CONTRAST TECHNIQUE: Contiguous axial images were obtained from the base of the skull through the vertex without intravenous contrast. COMPARISON:  None. FINDINGS: Mild generalized cerebral and cerebellar atrophy.No intracranial hemorrhage, mass effect, or midline shift. No hydrocephalus. The basilar cisterns are patent. No evidence of territorial infarct. No intracranial fluid collection. Calvarium is intact. Included paranasal sinuses and mastoid air cells are well aerated. IMPRESSION: Mild atrophy.  No acute intracranial abnormality. Electronically  Signed   By: Jeb Levering M.D.   On: 11/02/2014 00:50   Ct Abdomen Pelvis W Contrast  11/02/2014  CLINICAL DATA:  Acute onset of nausea and vomiting, status post recent chemotherapy. Known bone metastases. Generalized abdominal pain and neutropenia. Initial encounter. EXAM: CT ABDOMEN AND PELVIS WITH CONTRAST TECHNIQUE: Multidetector CT imaging of the abdomen and pelvis was performed using the standard protocol following bolus administration of intravenous contrast. CONTRAST:  155mL OMNIPAQUE IOHEXOL 300 MG/ML  SOLN COMPARISON:  CT of the chest, abdomen and pelvis performed 03/30/2014 FINDINGS: Minimal left basilar scarring is noted. Scattered coronary artery calcifications are seen. The liver is unremarkable in appearance. The spleen is enlarged, measuring 15.7 cm in length. The patient is status post cholecystectomy, with clips noted at the gallbladder fossa. The pancreas and adrenal glands are unremarkable. Small bilateral renal cysts are seen, measuring up to 2.3 cm in size. The kidneys are otherwise unremarkable in appearance. There is no evidence of hydronephrosis. No renal or ureteral stones are seen. No perinephric stranding is appreciated. No free fluid is identified. The small bowel is unremarkable in appearance. The stomach is within normal limits. No acute vascular abnormalities are seen. An IVC filter is noted. The patient's aortoiliac stent graft is grossly unremarkable in appearance. The appendix is normal in caliber, without evidence of appendicitis. Scattered diverticulosis is noted along the descending and sigmoid colon, without evidence of diverticulitis. The bladder is mildly distended and grossly unremarkable. The prostate is enlarged, measuring 5.3 cm in transverse dimension. No inguinal lymphadenopathy is seen. No acute osseous abnormalities are identified. Vacuum phenomenon is noted at L5-S1, with endplate sclerotic change. IMPRESSION: 1. No acute abnormality seen to explain the  patient's symptoms. 2. Scattered diverticulosis along the descending and sigmoid colon, without evidence of diverticulitis. 3. Aortoiliac stent graft is grossly unremarkable in appearance. 4. Small bilateral renal cysts seen. 5. Scattered coronary artery calcifications noted. 6. Splenomegaly noted. 7. Enlarged prostate seen. Electronically Signed   By: Garald Balding M.D.   On: 11/02/2014 02:54   Ir Fluoro Guide Cv Line Right  10/06/2014  CLINICAL DATA:  69 year old with myelodysplastic syndrome and needs Port-A-Cath for treatment access. EXAM: FLUOROSCOPIC AND ULTRASOUND GUIDED PLACEMENT OF A SUBCUTANEOUS PORT. Physician: Stephan Minister. Anselm Pancoast, MD FLUOROSCOPY TIME:  12 seconds, 3 mGy MEDICATIONS AND MEDICAL HISTORY: 2 g Ancef. As antibiotic prophylaxis, Ancef was ordered pre-procedure and administered intravenously within one hour of incision. 5 mg Versed, 100 mcg fentanyl. A radiology nurse monitored the patient for moderate sedation. ANESTHESIA/SEDATION: Moderate sedation time: 36 minutes PROCEDURE: The patient is anemic with a very low white count related to his myelodysplasia. I discussed the lab findings with the patient and Dr. Marin Olp. We discussed placing a Port-A-Cath versus a PICC line. Patient was  very adamant about having a Port-A-Cath and understood the risks with his anemia and low white blood cell count. Informed consent was obtained. Patient was placed supine on the interventional table. Ultrasound confirmed a patent right internal jugular vein. The right chest and neck were cleaned with a skin antiseptic and a sterile drape was placed. Maximal barrier sterile technique was utilized including caps, mask, sterile gowns, sterile gloves, sterile drape, hand hygiene and skin antiseptic. The right neck was anesthetized with 1% lidocaine. Small incision was made in the right neck with a blade. Micropuncture set was placed in the right internal jugular vein with ultrasound guidance. The micropuncture wire was  used for measurement purposes. The right chest was anesthetized with 1% lidocaine with epinephrine. #15 blade was used to make an incision and a subcutaneous port pocket was formed. [place] was assembled. Subcutaneous tunnel was formed with a stiff tunneling device. The port catheter was brought through the subcutaneous tunnel. The port was placed in the subcutaneous pocket. The micropuncture set was exchanged for a peel-away sheath. The catheter was placed through the peel-away sheath and the tip was positioned at the superior cavoatrial junction. Catheter placement was confirmed with fluoroscopy. The port was accessed and flushed with heparinized saline. The port pocket was closed using two layers of absorbable sutures and Dermabond. The vein skin site was closed using a single layer of absorbable suture and Dermabond. Sterile dressings were applied. Patient tolerated the procedure well without an immediate complication. Ultrasound and fluoroscopic images were taken and saved for this procedure. Estimated blood loss: Minimal COMPLICATIONS: None IMPRESSION: Placement of a subcutaneous port device. Catheter tip at the superior cavoatrial junction and ready to be used. Electronically Signed   By: Markus Daft M.D.   On: 10/06/2014 17:57   Ir US Guide Vasc Access Right  10/06/2014  CLINICAL DATA:  69 year old with myelodysplastic syndrome and needs Port-A-Cath for treatment access. EXAM: FLUOROSCOPIC AND ULTRASOUND GUIDED PLACEMENT OF A SUBCUTANEOUS PORT. Physician: Stephan Minister. Anselm Pancoast, MD FLUOROSCOPY TIME:  12 seconds, 3 mGy MEDICATIONS AND MEDICAL HISTORY: 2 g Ancef. As antibiotic prophylaxis, Ancef was ordered pre-procedure and administered intravenously within one hour of incision. 5 mg Versed, 100 mcg fentanyl. A radiology nurse monitored the patient for moderate sedation. ANESTHESIA/SEDATION: Moderate sedation time: 36 minutes PROCEDURE: The patient is anemic with a very low white count related to his  myelodysplasia. I discussed the lab findings with the patient and Dr. Marin Olp. We discussed placing a Port-A-Cath versus a PICC line. Patient was very adamant about having a Port-A-Cath and understood the risks with his anemia and low white blood cell count. Informed consent was obtained. Patient was placed supine on the interventional table. Ultrasound confirmed a patent right internal jugular vein. The right chest and neck were cleaned with a skin antiseptic and a sterile drape was placed. Maximal barrier sterile technique was utilized including caps, mask, sterile gowns, sterile gloves, sterile drape, hand hygiene and skin antiseptic. The right neck was anesthetized with 1% lidocaine. Small incision was made in the right neck with a blade. Micropuncture set was placed in the right internal jugular vein with ultrasound guidance. The micropuncture wire was used for measurement purposes. The right chest was anesthetized with 1% lidocaine with epinephrine. #15 blade was used to make an incision and a subcutaneous port pocket was formed. [place] was assembled. Subcutaneous tunnel was formed with a stiff tunneling device. The port catheter was brought through the subcutaneous tunnel. The  port was placed in the subcutaneous pocket. The micropuncture set was exchanged for a peel-away sheath. The catheter was placed through the peel-away sheath and the tip was positioned at the superior cavoatrial junction. Catheter placement was confirmed with fluoroscopy. The port was accessed and flushed with heparinized saline. The port pocket was closed using two layers of absorbable sutures and Dermabond. The vein skin site was closed using a single layer of absorbable suture and Dermabond. Sterile dressings were applied. Patient tolerated the procedure well without an immediate complication. Ultrasound and fluoroscopic images were taken and saved for this procedure. Estimated blood loss: Minimal COMPLICATIONS: None  IMPRESSION: Placement of a subcutaneous port device. Catheter tip at the superior cavoatrial junction and ready to be used. Electronically Signed   By: Markus Daft M.D.   On: 10/06/2014 17:57   Dg Chest Port 1 View  10/16/2014  CLINICAL DATA:  Status post left VATS and blebs stapling. EXAM: PORTABLE CHEST 1 VIEW COMPARISON:  10/16/2014 and prior studies FINDINGS: Mild cardiomegaly present. A right IJ Port-A-Cath with tip overlying the lower SVC and left IJ central venous catheter with tip overlying the mid SVC/ azygos region again noted. There is no evidence of pneumothorax. Ill-defined density overlying the left upper lung is unchanged. Surgical sutures/staples overlying the left lung apex again noted. No significant changes identified. IMPRESSION: Little significant change from the prior study except for no definite pneumothorax now identified. Support apparatus and left apical opacity again noted. Electronically Signed   By: Margarette Canada M.D.   On: 10/16/2014 14:28   Dg Chest Port 1 View  10/16/2014  CLINICAL DATA:  Atelectasis.  Follow-up pneumothorax EXAM: PORTABLE CHEST 1 VIEW COMPARISON:  10/15/2014 FINDINGS: Probable small left apical pneumothorax is unchanged. Left chest tube remains in place. Small left effusion is unchanged. Left upper lobe density may represent pleural fluid following left pleurodesis. Right jugular Port-A-Cath tip in the SVC unchanged. Left jugular central venous catheter tip in the SVC unchanged. Right lung remains clear. IMPRESSION: Small left apical pneumothorax unchanged. Small left effusion. Left upper lobe density unchanged consistent with pleurodesis. Electronically Signed   By: Franchot Gallo M.D.   On: 10/16/2014 07:41   Dg Chest Port 1 View  10/15/2014  CLINICAL DATA:  Short of breath EXAM: PORTABLE CHEST 1 VIEW COMPARISON:  Radiograph 10/14/2014 FINDINGS: Interval removal of 1 of 2 LEFT chest tubes. Small LEFT apical pneumothorax is noted less than 10% volume. Small  anterior pneumothorax noted. Subpulmonic pneumothorax is noted at the LEFT lung base. There is edema within the LEFT upper lobe. LEFT central venous line and RIGHT power port unchanged. RIGHT lung is clear. IMPRESSION: 1. Small RIGHT apical, anterior and subpulmonic pneumothorax noted following LEFT upper chest tube removal. 2. Edema within the upper lobe. 3. Lower LEFT chest tube remains in place. 4. Small effusion. Findings conveyed toICU nurse Mickel Baas  on 10/15/2014  at07:56. Electronically Signed   By: Suzy Bouchard M.D.   On: 10/15/2014 08:00   Dg Chest Port 1 View  10/14/2014  CLINICAL DATA:  Pneumothorax EXAM: PORTABLE CHEST 1 VIEW COMPARISON:  10/13/2014 FINDINGS: Right-sided Port-A-Cath remains in place with tip over the distal SVC. Left IJ approach central line tip terminates over the brachiocephalic/ SVC junction. Two left-sided chest tubes remain in place. Hazy left apical opacity and small left pleural effusion persist. Persistent presumed scarring at the lung bases versus atelectasis. No pneumothorax identified. IMPRESSION: No significant change in small left pneumothorax with hazy left apical pulmonary  opacity. Electronically Signed   By: Christiana Pellant M.D.   On: 10/14/2014 08:01   Dg Chest Port 1 View  10/13/2014  CLINICAL DATA:  Follow-up pneumothorax, status post bleb stapling on the left. EXAM: PORTABLE CHEST 1 VIEW COMPARISON:  Portable chest x-ray of October 12, 2014 FINDINGS: The lungs are well-expanded. No pneumothorax on the left is evident. The 2 chest tubes are unchanged. There is some pleural fluid low layering laterally along the thoracic wall which is stable. The interstitial markings of the left lung remain increased diffusely. There is no significant mediastinal shift. The right lung is well expanded and grossly clear. The cardiac silhouette is top-normal in size. The pulmonary vascularity is normal. The Port-A-Cath appliance tip projects over the midportion of the SVC. The  left internal jugular venous catheter tip projects over the proximal SVC. IMPRESSION: Slight interval improvement in aeration of the left lung. There is no pneumothorax. Pulmonary interstitial edema or infiltrate on the left persists. The support tubes are in reasonable position. Electronically Signed   By: David  [place] M.D.   On: 10/13/2014 07:22   Dg Chest Port 1 View  10/12/2014  CLINICAL DATA:  Post VATS.  Left pneumothorax, chest tubes. EXAM: PORTABLE CHEST 1 VIEW COMPARISON:  10/11/2014 FINDINGS: Interval removal of endotracheal tube. Remainder of the support devices are stable. No pneumothorax on the left. Stable postoperative changes on the left with pleural thickening or effusion. Cardiomegaly. Improved aeration in the right lung with continued diffuse left lung airspace disease. IMPRESSION: Improving aeration on the right. Stable of postoperative appearance on the left. Interval extubation Electronically Signed   By: Charlett Nose M.D.   On: 10/12/2014 07:39   Dg Chest Portable 1 View  10/11/2014  CLINICAL DATA:  Status post chest tube placement. Status post stapling of 3 the left upper lobe blebs and talc pleurodesis on the left today. EXAM: PORTABLE CHEST 1 VIEW COMPARISON:  PA and lateral chest 10/10/2014. FINDINGS: 2 left chest tubes are identified there is no pneumothorax. Right IJ catheter is again seen. Left IJ catheter tip projects just within the superior vena cava. Endotracheal tube is identified with the tip in the left mainstem bronchus. Review of the chart indicates this is a double lumen tube. Increased density in the left upper chest is consistent with changes related to pleurodesis. Surgical staples are also seen in the left upper chest consistent with bleb stapling. No pneumothorax is identified. Heart size is mildly enlarged. IMPRESSION: Negative for pneumothorax with 2 left chest tubes in place. Findings consistent with bleb stapling and left pleurodesis are identified. Double  lumen endotracheal tube is in place with the lumen within the left mainstem bronchus. Left IJ approach central venous catheter tip projects in the upper superior vena cava. Electronically Signed   By: Drusilla Kanner M.D.   On: 10/11/2014 11:16   Dg Chest Portable 1 View  10/09/2014  CLINICAL DATA:  Chest tube placement EXAM: PORTABLE CHEST 1 VIEW COMPARISON:  Earlier today FINDINGS: New left-sided chest tube with tip at the apex. No residual pneumothorax is seen. Paraseptal emphysema at the apices based on previous chest CT. No pleural fluid or collapse. Diffuse interstitial coarsening. Normal heart size and stable aortic tortuosity when accounting for differences in technique. Right IJ porta catheter, tip stable at the lower SVC. IMPRESSION: 1. Evacuated pneumothorax after left chest tube placement. 2. Paraseptal emphysema at the apices. Electronically Signed   By: Marnee Spring M.D.   On: 10/09/2014 21:48  Dg Chest Port 1 View  10/09/2014  CLINICAL DATA:  Shortness of breath. History of right Port-A-Cath placement on Thursday. EXAM: PORTABLE CHEST 1 VIEW COMPARISON:  PA and lateral chest 10/04/2014. FINDINGS: The patient has a very large left pneumothorax. Right IJ approach Port-A-Cath is in place with the tip projecting at the superior cavoatrial junction. The right lung is expanded and clear. Heart size is normal. There is no mediastinal shift. No focal bony abnormality. IMPRESSION: Very large left pneumothorax. Critical Value/emergent results were called by telephone at the time of interpretation on 10/09/2014 at 7:16 pm to [facility] who verbally acknowledged these results. Electronically Signed   By: Inge Rise M.D.   On: 10/09/2014 19:17   Dg Abd Acute W/chest  11/02/2014  CLINICAL DATA:  Acute onset of severe nausea. Patient on radiation therapy and chemotherapy for bone cancer. Initial encounter. EXAM: DG ABDOMEN ACUTE W/ 1V CHEST COMPARISON:  Chest radiograph  performed 10/21/2014, and abdominal radiograph performed 10/12/2014 FINDINGS: The lungs are well-aerated. Mild left-sided airspace opacity could reflect a loculated effusion or mild infection. There is no evidence of pneumothorax. The cardiomediastinal silhouette is borderline enlarged. A right-sided chest port is noted ending about the distal SVC. The visualized bowel gas pattern is unremarkable. Scattered stool and air are seen within the colon; there is no evidence of small bowel dilatation to suggest obstruction. No free intra-abdominal air is identified on the provided decubitus view. No acute osseous abnormalities are seen; the sacroiliac joints are unremarkable in appearance. An IVC filter is noted. An aortoiliac stent graft is seen. Clips are noted within the right upper quadrant, reflecting prior cholecystectomy. IMPRESSION: 1. Unremarkable bowel gas pattern; no free intra-abdominal air seen. 2. Mild left-sided airspace opacity could reflect a loculated pleural effusion or mild infection, slightly less prominent than on the recent chest radiograph. 3. Borderline cardiomegaly. Electronically Signed   By: Garald Balding M.D.   On: 11/02/2014 01:02   Dg Abd Portable 1v  10/12/2014  CLINICAL DATA:  Abdominal distension. EXAM: PORTABLE ABDOMEN - 1 VIEW COMPARISON:  Radiographs 06/10/2014 FINDINGS: Moderate air distended small bowel and colon suggesting a diffuse ileus. No free air. The soft tissue shadows are grossly maintained. The bony structures are intact. Stable aortoiliac stent graft. An IVC filter is noted. IMPRESSION: Ileus appearing bowel gas pattern.  No free air. Electronically Signed   By: Marijo Sanes M.D.   On: 10/12/2014 11:42   Medications  fentaNYL (SUBLIMAZE) injection 50 mcg (50 mcg Intravenous Given 11/01/14 2346)  ondansetron (ZOFRAN) injection 4 mg (4 mg Intravenous Given by Other 11/01/14 2346)  sodium chloride 0.9 % bolus 1,000 mL (1,000 mLs Intravenous New Bag/Given 11/01/14  2346)  lidocaine-prilocaine (EMLA) cream ( Topical Given 11/02/14 0010)  lip balm (CARMEX) ointment ( Topical Given 11/02/14 0009)  ondansetron (ZOFRAN) injection 4 mg (4 mg Intravenous Given 11/02/14 0118)  LORazepam (ATIVAN) injection 0.5 mg (0.5 mg Intravenous Given 11/02/14 0159)  iohexol (OMNIPAQUE) 300 MG/ML solution 100 mL (100 mLs Intravenous Contrast Given 11/02/14 0213)    Still no relief with multiple rounds of zofran.  Ativan given for refractory nausea and vomiting.    Will admit for ongoing care.    I, Sapphire Tygart-RASCH,Evaleen Sant K, personally performed the services described in this documentation. All medical record entries made by the scribe were at my direction and in my presence.  I have reviewed the chart and discharge instructions and agree that the record reflects my personal performance and is accurate and  complete. Tennyson Kallen-RASCH,Jabari Swoveland K.  11/02/2014. 3:51 AM.      British Moyd, MD 11/02/14 [phone]

## 2014-11-01 NOTE — ED Notes (Signed)
Wife states patient is not undergoing radiation, just chemo

## 2014-11-01 NOTE — Patient Instructions (Signed)
[facility] Discharge Instructions for Patients Receiving Chemotherapy  Today you received the following chemotherapy agents Vidaza  To help prevent nausea and vomiting after your treatment, we encourage you to take your nausea medication   If you develop nausea and vomiting that is not controlled by your nausea medication, call the clinic.   BELOW ARE SYMPTOMS THAT SHOULD BE REPORTED IMMEDIATELY:  *FEVER GREATER THAN 100.5 F  *CHILLS WITH OR WITHOUT FEVER  NAUSEA AND VOMITING THAT IS NOT CONTROLLED WITH YOUR NAUSEA MEDICATION  *UNUSUAL SHORTNESS OF BREATH  *UNUSUAL BRUISING OR BLEEDING  TENDERNESS IN MOUTH AND THROAT WITH OR WITHOUT PRESENCE OF ULCERS  *URINARY PROBLEMS  *BOWEL PROBLEMS  UNUSUAL RASH Items with * indicate a potential emergency and should be followed up as soon as possible.  Feel free to call the clinic you have any questions or concerns. The clinic phone number is [phone].  Please show the CHEMO ALERT CARD at check-in to the Emergency Department and triage nurse.   

## 2014-11-01 NOTE — ED Notes (Signed)
Patient is ill appearing with tachypnea, respirations grunting, patient is moaning with nausea and abdominal pain.  Patient answers with one word to questions asked, states "sick".  Family is very attentive at bedside and is able to answer questions at this time.  MP SR with rate 60's, stable BP at this time with RR 30's.  500 cc fluid bolus infused.

## 2014-11-02 ENCOUNTER — Ambulatory Visit: Payer: Medicare Other | Admitting: Cardiothoracic Surgery

## 2014-11-02 ENCOUNTER — Ambulatory Visit: Payer: Medicare Other

## 2014-11-02 ENCOUNTER — Emergency Department (HOSPITAL_COMMUNITY): Payer: Medicare Other

## 2014-11-02 ENCOUNTER — Other Ambulatory Visit (HOSPITAL_BASED_OUTPATIENT_CLINIC_OR_DEPARTMENT_OTHER): Payer: Medicare Other

## 2014-11-02 ENCOUNTER — Encounter: Payer: Self-pay | Admitting: Hematology & Oncology

## 2014-11-02 ENCOUNTER — Ambulatory Visit: Payer: Self-pay | Admitting: Cardiothoracic Surgery

## 2014-11-02 ENCOUNTER — Telehealth: Payer: Self-pay | Admitting: Hematology & Oncology

## 2014-11-02 ENCOUNTER — Encounter (HOSPITAL_COMMUNITY): Payer: Self-pay

## 2014-11-02 DIAGNOSIS — R1013 Epigastric pain: Secondary | ICD-10-CM

## 2014-11-02 DIAGNOSIS — I10 Essential (primary) hypertension: Secondary | ICD-10-CM | POA: Diagnosis present

## 2014-11-02 DIAGNOSIS — R112 Nausea with vomiting, unspecified: Secondary | ICD-10-CM | POA: Diagnosis present

## 2014-11-02 DIAGNOSIS — B009 Herpesviral infection, unspecified: Secondary | ICD-10-CM | POA: Diagnosis present

## 2014-11-02 DIAGNOSIS — Z955 Presence of coronary angioplasty implant and graft: Secondary | ICD-10-CM | POA: Diagnosis not present

## 2014-11-02 DIAGNOSIS — Z8051 Family history of malignant neoplasm of kidney: Secondary | ICD-10-CM | POA: Diagnosis not present

## 2014-11-02 DIAGNOSIS — R001 Bradycardia, unspecified: Secondary | ICD-10-CM | POA: Diagnosis present

## 2014-11-02 DIAGNOSIS — D61810 Antineoplastic chemotherapy induced pancytopenia: Secondary | ICD-10-CM | POA: Diagnosis present

## 2014-11-02 DIAGNOSIS — R1084 Generalized abdominal pain: Secondary | ICD-10-CM | POA: Diagnosis not present

## 2014-11-02 DIAGNOSIS — K219 Gastro-esophageal reflux disease without esophagitis: Secondary | ICD-10-CM | POA: Diagnosis present

## 2014-11-02 DIAGNOSIS — Z9049 Acquired absence of other specified parts of digestive tract: Secondary | ICD-10-CM | POA: Diagnosis not present

## 2014-11-02 DIAGNOSIS — Z8679 Personal history of other diseases of the circulatory system: Secondary | ICD-10-CM | POA: Diagnosis not present

## 2014-11-02 DIAGNOSIS — Z888 Allergy status to other drugs, medicaments and biological substances status: Secondary | ICD-10-CM | POA: Diagnosis not present

## 2014-11-02 DIAGNOSIS — G43A0 Cyclical vomiting, not intractable: Secondary | ICD-10-CM | POA: Diagnosis present

## 2014-11-02 DIAGNOSIS — F419 Anxiety disorder, unspecified: Secondary | ICD-10-CM | POA: Diagnosis present

## 2014-11-02 DIAGNOSIS — K1120 Sialoadenitis, unspecified: Secondary | ICD-10-CM | POA: Diagnosis present

## 2014-11-02 DIAGNOSIS — D4622 Refractory anemia with excess of blasts 2: Secondary | ICD-10-CM | POA: Diagnosis present

## 2014-11-02 DIAGNOSIS — E785 Hyperlipidemia, unspecified: Secondary | ICD-10-CM | POA: Diagnosis present

## 2014-11-02 DIAGNOSIS — R1033 Periumbilical pain: Secondary | ICD-10-CM | POA: Diagnosis not present

## 2014-11-02 DIAGNOSIS — Z808 Family history of malignant neoplasm of other organs or systems: Secondary | ICD-10-CM | POA: Diagnosis not present

## 2014-11-02 DIAGNOSIS — I252 Old myocardial infarction: Secondary | ICD-10-CM | POA: Diagnosis not present

## 2014-11-02 DIAGNOSIS — D469 Myelodysplastic syndrome, unspecified: Secondary | ICD-10-CM | POA: Diagnosis not present

## 2014-11-02 DIAGNOSIS — Z7982 Long term (current) use of aspirin: Secondary | ICD-10-CM | POA: Diagnosis not present

## 2014-11-02 DIAGNOSIS — Z881 Allergy status to other antibiotic agents status: Secondary | ICD-10-CM | POA: Diagnosis not present

## 2014-11-02 DIAGNOSIS — T451X5A Adverse effect of antineoplastic and immunosuppressive drugs, initial encounter: Secondary | ICD-10-CM | POA: Diagnosis present

## 2014-11-02 DIAGNOSIS — R221 Localized swelling, mass and lump, neck: Secondary | ICD-10-CM | POA: Diagnosis present

## 2014-11-02 DIAGNOSIS — E7800 Pure hypercholesterolemia, unspecified: Secondary | ICD-10-CM | POA: Diagnosis present

## 2014-11-02 DIAGNOSIS — D696 Thrombocytopenia, unspecified: Secondary | ICD-10-CM | POA: Diagnosis not present

## 2014-11-02 DIAGNOSIS — R1110 Vomiting, unspecified: Secondary | ICD-10-CM

## 2014-11-02 DIAGNOSIS — E43 Unspecified severe protein-calorie malnutrition: Secondary | ICD-10-CM | POA: Diagnosis present

## 2014-11-02 DIAGNOSIS — D6481 Anemia due to antineoplastic chemotherapy: Secondary | ICD-10-CM | POA: Diagnosis not present

## 2014-11-02 DIAGNOSIS — Z87891 Personal history of nicotine dependence: Secondary | ICD-10-CM | POA: Diagnosis not present

## 2014-11-02 DIAGNOSIS — Z6824 Body mass index (BMI) 24.0-24.9, adult: Secondary | ICD-10-CM | POA: Diagnosis not present

## 2014-11-02 DIAGNOSIS — B029 Zoster without complications: Secondary | ICD-10-CM | POA: Diagnosis present

## 2014-11-02 DIAGNOSIS — E876 Hypokalemia: Secondary | ICD-10-CM | POA: Diagnosis present

## 2014-11-02 DIAGNOSIS — Z885 Allergy status to narcotic agent status: Secondary | ICD-10-CM | POA: Diagnosis not present

## 2014-11-02 DIAGNOSIS — R109 Unspecified abdominal pain: Secondary | ICD-10-CM | POA: Diagnosis not present

## 2014-11-02 DIAGNOSIS — T447X5A Adverse effect of beta-adrenoreceptor antagonists, initial encounter: Secondary | ICD-10-CM | POA: Diagnosis present

## 2014-11-02 DIAGNOSIS — D4620 Refractory anemia with excess of blasts, unspecified: Secondary | ICD-10-CM | POA: Diagnosis not present

## 2014-11-02 DIAGNOSIS — J9383 Other pneumothorax: Secondary | ICD-10-CM | POA: Diagnosis present

## 2014-11-02 DIAGNOSIS — Z801 Family history of malignant neoplasm of trachea, bronchus and lung: Secondary | ICD-10-CM | POA: Diagnosis not present

## 2014-11-02 DIAGNOSIS — K2210 Ulcer of esophagus without bleeding: Secondary | ICD-10-CM | POA: Diagnosis present

## 2014-11-02 DIAGNOSIS — D61818 Other pancytopenia: Secondary | ICD-10-CM

## 2014-11-02 DIAGNOSIS — G43A1 Cyclical vomiting, intractable: Secondary | ICD-10-CM | POA: Diagnosis not present

## 2014-11-02 DIAGNOSIS — M1990 Unspecified osteoarthritis, unspecified site: Secondary | ICD-10-CM | POA: Diagnosis present

## 2014-11-02 DIAGNOSIS — K2970 Gastritis, unspecified, without bleeding: Secondary | ICD-10-CM | POA: Diagnosis present

## 2014-11-02 DIAGNOSIS — J449 Chronic obstructive pulmonary disease, unspecified: Secondary | ICD-10-CM | POA: Diagnosis not present

## 2014-11-02 DIAGNOSIS — I2510 Atherosclerotic heart disease of native coronary artery without angina pectoris: Secondary | ICD-10-CM | POA: Diagnosis present

## 2014-11-02 DIAGNOSIS — F329 Major depressive disorder, single episode, unspecified: Secondary | ICD-10-CM | POA: Diagnosis present

## 2014-11-02 DIAGNOSIS — Z8249 Family history of ischemic heart disease and other diseases of the circulatory system: Secondary | ICD-10-CM | POA: Diagnosis not present

## 2014-11-02 DIAGNOSIS — Z79899 Other long term (current) drug therapy: Secondary | ICD-10-CM | POA: Diagnosis not present

## 2014-11-02 LAB — CBC
HEMATOCRIT: 18.9 % — AB (ref 39.0–52.0)
Hemoglobin: 6.4 g/dL — CL (ref 13.0–17.0)
MCH: 28.6 pg (ref 26.0–34.0)
MCHC: 33.9 g/dL (ref 30.0–36.0)
MCV: 84.4 fL (ref 78.0–100.0)
Platelets: 13 10*3/uL — CL (ref 150–400)
RBC: 2.24 MIL/uL — AB (ref 4.22–5.81)
RDW: 14.2 % (ref 11.5–15.5)
WBC: 0.8 10*3/uL — AB (ref 4.0–10.5)

## 2014-11-02 LAB — PROTIME-INR
INR: 1.39 (ref 0.00–1.49)
PROTHROMBIN TIME: 17.2 s — AB (ref 11.6–15.2)

## 2014-11-02 LAB — CBC WITH DIFFERENTIAL/PLATELET
Basophils Absolute: 0 10*3/uL (ref 0.0–0.1)
Basophils Relative: 0 %
EOS ABS: 0 10*3/uL (ref 0.0–0.7)
EOS PCT: 0 %
HCT: 21.6 % — ABNORMAL LOW (ref 39.0–52.0)
Hemoglobin: 7.3 g/dL — ABNORMAL LOW (ref 13.0–17.0)
LYMPHS ABS: 0.2 10*3/uL — AB (ref 0.7–4.0)
LYMPHS PCT: 16 %
MCH: 28 pg (ref 26.0–34.0)
MCHC: 33.8 g/dL (ref 30.0–36.0)
MCV: 82.8 fL (ref 78.0–100.0)
MONO ABS: 0 10*3/uL — AB (ref 0.1–1.0)
MONOS PCT: 3 %
Neutro Abs: 0.7 10*3/uL — ABNORMAL LOW (ref 1.7–7.7)
Neutrophils Relative %: 80 %
PLATELETS: 17 10*3/uL — AB (ref 150–400)
RBC: 2.61 MIL/uL — AB (ref 4.22–5.81)
RDW: 13.9 % (ref 11.5–15.5)
WBC: 0.9 10*3/uL — AB (ref 4.0–10.5)

## 2014-11-02 LAB — URINALYSIS, ROUTINE W REFLEX MICROSCOPIC
BILIRUBIN URINE: NEGATIVE
GLUCOSE, UA: NEGATIVE mg/dL
HGB URINE DIPSTICK: NEGATIVE
Ketones, ur: NEGATIVE mg/dL
Leukocytes, UA: NEGATIVE
Nitrite: NEGATIVE
PROTEIN: NEGATIVE mg/dL
Specific Gravity, Urine: 1.017 (ref 1.005–1.030)
UROBILINOGEN UA: 1 mg/dL (ref 0.0–1.0)
pH: 6 (ref 5.0–8.0)

## 2014-11-02 LAB — HEPATIC FUNCTION PANEL
ALBUMIN: 3.1 g/dL — AB (ref 3.5–5.0)
ALK PHOS: 78 U/L (ref 38–126)
ALT: 15 U/L — AB (ref 17–63)
AST: 22 U/L (ref 15–41)
BILIRUBIN TOTAL: 0.6 mg/dL (ref 0.3–1.2)
Bilirubin, Direct: 0.2 mg/dL (ref 0.1–0.5)
Indirect Bilirubin: 0.4 mg/dL (ref 0.3–0.9)
TOTAL PROTEIN: 7.8 g/dL (ref 6.5–8.1)

## 2014-11-02 LAB — I-STAT CHEM 8, ED
BUN: 27 mg/dL — ABNORMAL HIGH (ref 6–20)
CALCIUM ION: 1.1 mmol/L — AB (ref 1.13–1.30)
CHLORIDE: 105 mmol/L (ref 101–111)
Creatinine, Ser: 1.2 mg/dL (ref 0.61–1.24)
GLUCOSE: 149 mg/dL — AB (ref 65–99)
HCT: 22 % — ABNORMAL LOW (ref 39.0–52.0)
Hemoglobin: 7.5 g/dL — ABNORMAL LOW (ref 13.0–17.0)
Potassium: 4.2 mmol/L (ref 3.5–5.1)
SODIUM: 139 mmol/L (ref 135–145)
TCO2: 21 mmol/L (ref 0–100)

## 2014-11-02 LAB — COMPREHENSIVE METABOLIC PANEL
ALT: 14 U/L — ABNORMAL LOW (ref 17–63)
ANION GAP: 7 (ref 5–15)
AST: 15 U/L (ref 15–41)
Albumin: 2.7 g/dL — ABNORMAL LOW (ref 3.5–5.0)
Alkaline Phosphatase: 69 U/L (ref 38–126)
BILIRUBIN TOTAL: 0.8 mg/dL (ref 0.3–1.2)
BUN: 21 mg/dL — ABNORMAL HIGH (ref 6–20)
CHLORIDE: 108 mmol/L (ref 101–111)
CO2: 22 mmol/L (ref 22–32)
Calcium: 7.7 mg/dL — ABNORMAL LOW (ref 8.9–10.3)
Creatinine, Ser: 1.02 mg/dL (ref 0.61–1.24)
Glucose, Bld: 106 mg/dL — ABNORMAL HIGH (ref 65–99)
POTASSIUM: 3.6 mmol/L (ref 3.5–5.1)
Sodium: 137 mmol/L (ref 135–145)
TOTAL PROTEIN: 6.6 g/dL (ref 6.5–8.1)

## 2014-11-02 LAB — TYPE AND SCREEN
ABO/RH(D): A POS
Antibody Screen: NEGATIVE
UNIT DIVISION: 0
UNIT DIVISION: 0
Unit division: 0

## 2014-11-02 LAB — LIPASE, BLOOD: Lipase: 29 U/L (ref 22–51)

## 2014-11-02 LAB — I-STAT CG4 LACTIC ACID, ED
Lactic Acid, Venous: 0.4 mmol/L — ABNORMAL LOW (ref 0.5–2.0)
Lactic Acid, Venous: 1.42 mmol/L (ref 0.5–2.0)

## 2014-11-02 LAB — PREPARE RBC (CROSSMATCH)

## 2014-11-02 LAB — GLUCOSE, CAPILLARY: GLUCOSE-CAPILLARY: 92 mg/dL (ref 65–99)

## 2014-11-02 LAB — APTT: APTT: 36 s (ref 24–37)

## 2014-11-02 LAB — I-STAT TROPONIN, ED: Troponin i, poc: 0.01 ng/mL (ref 0.00–0.08)

## 2014-11-02 MED ORDER — SIMVASTATIN 20 MG PO TABS
20.0000 mg | ORAL_TABLET | Freq: Every day | ORAL | Status: DC
  Administered 2014-11-02 – 2014-11-04 (×3): 20 mg via ORAL
  Filled 2014-11-02 (×3): qty 1

## 2014-11-02 MED ORDER — POLYVINYL ALCOHOL 1.4 % OP SOLN
2.0000 [drp] | Freq: Three times a day (TID) | OPHTHALMIC | Status: DC | PRN
  Filled 2014-11-02: qty 15

## 2014-11-02 MED ORDER — HYDROMORPHONE HCL 1 MG/ML IJ SOLN
1.0000 mg | INTRAMUSCULAR | Status: DC | PRN
  Administered 2014-11-02 – 2014-11-03 (×2): 1 mg via INTRAVENOUS
  Filled 2014-11-02 (×2): qty 1

## 2014-11-02 MED ORDER — ASPIRIN EC 81 MG PO TBEC
81.0000 mg | DELAYED_RELEASE_TABLET | Freq: Every day | ORAL | Status: DC
  Administered 2014-11-02: 81 mg via ORAL
  Filled 2014-11-02: qty 1

## 2014-11-02 MED ORDER — FAMOTIDINE 10 MG PO TABS
10.0000 mg | ORAL_TABLET | Freq: Two times a day (BID) | ORAL | Status: DC
  Administered 2014-11-02: 10 mg via ORAL
  Filled 2014-11-02 (×2): qty 1

## 2014-11-02 MED ORDER — LIDOCAINE-PRILOCAINE 2.5-2.5 % EX CREA
1.0000 "application " | TOPICAL_CREAM | CUTANEOUS | Status: DC | PRN
  Filled 2014-11-02: qty 5

## 2014-11-02 MED ORDER — ONDANSETRON HCL 4 MG/2ML IJ SOLN
4.0000 mg | Freq: Three times a day (TID) | INTRAMUSCULAR | Status: DC | PRN
Start: 2014-11-02 — End: 2014-11-02

## 2014-11-02 MED ORDER — ACETAMINOPHEN 500 MG PO TABS
500.0000 mg | ORAL_TABLET | Freq: Four times a day (QID) | ORAL | Status: DC | PRN
  Filled 2014-11-02: qty 1

## 2014-11-02 MED ORDER — BOOST / RESOURCE BREEZE PO LIQD
1.0000 | Freq: Two times a day (BID) | ORAL | Status: DC
  Administered 2014-11-02 – 2014-11-07 (×3): 1 via ORAL

## 2014-11-02 MED ORDER — NITROGLYCERIN 0.4 MG SL SUBL: 0.4000 mg | SUBLINGUAL_TABLET | SUBLINGUAL | Status: DC | PRN

## 2014-11-02 MED ORDER — DIPHENHYDRAMINE HCL 25 MG PO CAPS
25.0000 mg | ORAL_CAPSULE | Freq: Four times a day (QID) | ORAL | Status: DC | PRN
  Administered 2014-11-06 – 2014-11-08 (×3): 25 mg via ORAL
  Filled 2014-11-02 (×3): qty 1

## 2014-11-02 MED ORDER — SODIUM CHLORIDE 0.9 % IV SOLN
INTRAVENOUS | Status: DC
  Administered 2014-11-02 – 2014-11-03 (×4): via INTRAVENOUS

## 2014-11-02 MED ORDER — SODIUM CHLORIDE 0.9 % IJ SOLN
3.0000 mL | Freq: Two times a day (BID) | INTRAMUSCULAR | Status: DC
  Administered 2014-11-04 – 2014-11-08 (×5): 3 mL via INTRAVENOUS

## 2014-11-02 MED ORDER — SODIUM CHLORIDE 0.9 % IV SOLN: Freq: Once | INTRAVENOUS | Status: DC

## 2014-11-02 MED ORDER — ENSURE ENLIVE PO LIQD
237.0000 mL | Freq: Two times a day (BID) | ORAL | Status: DC
  Administered 2014-11-06 – 2014-11-08 (×3): 237 mL via ORAL

## 2014-11-02 MED ORDER — PANTOPRAZOLE SODIUM 40 MG IV SOLR
40.0000 mg | Freq: Two times a day (BID) | INTRAVENOUS | Status: DC
  Administered 2014-11-02 – 2014-11-04 (×5): 40 mg via INTRAVENOUS
  Filled 2014-11-02 (×6): qty 40

## 2014-11-02 MED ORDER — LORAZEPAM 2 MG/ML IJ SOLN
0.5000 mg | Freq: Once | INTRAMUSCULAR | Status: AC
  Administered 2014-11-02: 0.5 mg via INTRAVENOUS
  Filled 2014-11-02: qty 1

## 2014-11-02 MED ORDER — ONDANSETRON HCL 4 MG/2ML IJ SOLN
4.0000 mg | Freq: Once | INTRAMUSCULAR | Status: AC
  Administered 2014-11-02: 4 mg via INTRAVENOUS
  Filled 2014-11-02: qty 2

## 2014-11-02 MED ORDER — LIDOCAINE 5 % EX PTCH
1.0000 | MEDICATED_PATCH | CUTANEOUS | Status: DC
  Administered 2014-11-02 – 2014-11-08 (×7): 1 via TRANSDERMAL
  Filled 2014-11-02 (×10): qty 1

## 2014-11-02 MED ORDER — ONDANSETRON HCL 4 MG/2ML IJ SOLN
4.0000 mg | INTRAMUSCULAR | Status: DC | PRN
  Administered 2014-11-03 – 2014-11-05 (×7): 4 mg via INTRAVENOUS
  Filled 2014-11-02 (×6): qty 2

## 2014-11-02 MED ORDER — TEMAZEPAM 15 MG PO CAPS
15.0000 mg | ORAL_CAPSULE | Freq: Every evening | ORAL | Status: DC | PRN
  Administered 2014-11-02 – 2014-11-05 (×2): 15 mg via ORAL
  Filled 2014-11-02 (×2): qty 1

## 2014-11-02 MED ORDER — METOPROLOL TARTRATE 25 MG PO TABS
12.5000 mg | ORAL_TABLET | Freq: Two times a day (BID) | ORAL | Status: DC
  Administered 2014-11-02: 12.5 mg via ORAL
  Filled 2014-11-02: qty 1

## 2014-11-02 MED ORDER — ALPRAZOLAM 0.25 MG PO TABS: 0.2500 mg | ORAL_TABLET | Freq: Three times a day (TID) | ORAL | Status: DC | PRN

## 2014-11-02 MED ORDER — SODIUM CHLORIDE 0.9 % IV SOLN
Freq: Once | INTRAVENOUS | Status: AC
  Administered 2014-11-02: 08:00:00 via INTRAVENOUS

## 2014-11-02 MED ORDER — IOHEXOL 300 MG/ML  SOLN
100.0000 mL | Freq: Once | INTRAMUSCULAR | Status: AC | PRN
  Administered 2014-11-02: 100 mL via INTRAVENOUS

## 2014-11-02 MED ORDER — IOHEXOL 300 MG/ML  SOLN: 25.0000 mL | Freq: Once | INTRAMUSCULAR | Status: DC | PRN

## 2014-11-02 MED ORDER — HYDROCODONE-ACETAMINOPHEN 5-325 MG PO TABS
1.0000 | ORAL_TABLET | Freq: Four times a day (QID) | ORAL | Status: DC | PRN
  Administered 2014-11-03: 1 via ORAL
  Filled 2014-11-02: qty 1

## 2014-11-02 MED ORDER — FAMCICLOVIR 500 MG PO TABS
500.0000 mg | ORAL_TABLET | Freq: Every day | ORAL | Status: DC
  Administered 2014-11-02: 500 mg via ORAL
  Filled 2014-11-02 (×2): qty 1

## 2014-11-02 NOTE — ED Notes (Signed)
Dr. Althea Grimmer at bedside.

## 2014-11-02 NOTE — ED Notes (Signed)
Pt is altered by pulling at the covers, family states he has been petting a horse while in bed. Pt is not attempting to get out of bed. Possible side effect from Ativan. Informed Dr. Curlene Labrum. No new orders.

## 2014-11-02 NOTE — Progress Notes (Signed)
Initial Nutrition Assessment  DOCUMENTATION CODES:   Severe malnutrition in context of acute illness/injury  INTERVENTION:  - Will order Boost Breeze BD, each supplement provides 250 kcal and 9 grams of protein - Continue Ensure Enlive BID, each supplement provides 350 kcal and 20 grams of protein for when diet advanced - RD will continue to monitor for needs  NUTRITION DIAGNOSIS:   Increased nutrient needs related to catabolic illness, cancer and cancer related treatments as evidenced by estimated needs.  GOAL:   Patient will meet greater than or equal to 90% of their needs  MONITOR:   PO intake, Supplement acceptance, Weight trends, Labs, Skin, I & O's  REASON FOR ASSESSMENT:   Malnutrition Screening Tool  ASSESSMENT:   69 y.o. male with PMH of MDS on chemotherapy currently, hypertension, hyperlipidemia, GERD, depression, anxiety, CAD (as/PE of stent placement), diverticulitis, hypotestosteronism, DVT, COPD, shingles, who presented with nausea, vomiting and abdominal pain.  Pt seen for MST. BMI indicates normal weight status. Pt currently on CLD but did not have breakfast this AM; ordered apple juice and cherry Jello for pt at time of visit per his preference. Pt's wife in the room provides most of information as RN is working with pt and hooking up blood for transfusion. Unable to complete physical assessment at this time related to this.   Yesterday pt had severe nausea and ongoing vomiting x5 hours. Prior to yesterday pt had issues with the same which wife relates to reflux. She states pt would have nausea and vomiting due to reflux even without PO intake. Because of this, pt was eating less the past few weeks and was no experiencing hunger. She states pt has lost 40 lbs in the past 7 weeks. Per chart review, pt has lost 13 lbs (7% body weight) in the past 1 month which is significant for time frame.  Pt was drinking chocolate Ensure at home but wife had recently purchased  strawberry after she was informed that chocolate can exacerbate reflux. Pt interested in having Ensure during admission and also willing to try Boost Breeze while on CLD.   Not meeting needs. Medications reviewed. Labs reviewed; BUN elevated, Ca: 7.7 mg/dL.   Diet Order:  Diet clear liquid Room service appropriate?: Yes; Fluid consistency:: Thin  Skin:  Wound (see comment) (L chest incision (10/11/14))  Last BM:  10/18  Height:   Ht Readings from Last 1 Encounters:  11/02/14 5\' 10"  (1.778 m)    Weight:   Wt Readings from Last 1 Encounters:  11/02/14 168 lb 3.4 oz (76.3 kg)    Ideal Body Weight:  75.45 kg (kg)  BMI:  Body mass index is 24.14 kg/(m^2).  Estimated Nutritional Needs:   Kcal:  6644-0347  Protein:  80-90 grams  Fluid:  2-2.2 L/day  EDUCATION NEEDS:   No education needs identified at this time     Jarome Matin, RD, LDN Inpatient Clinical Dietitian Pager # [phone] After hours/weekend pager # [phone]

## 2014-11-02 NOTE — ED Notes (Signed)
Patient reminded we are in need of a urine sample as soon as he is able to provide one. Urinal at bedside.

## 2014-11-02 NOTE — ED Notes (Signed)
Patient transported to X-ray 

## 2014-11-02 NOTE — ED Notes (Signed)
Cool cloth to forehead, repositioned in bed for comfort

## 2014-11-02 NOTE — Progress Notes (Signed)
PT Cancellation Note  Patient Details Name: [name] MRN: [id number] DOB: 03-Oct-1945   Cancelled Treatment:    Reason Eval/Treat Not Completed: Medical issues which prohibited therapy (hgb is 6.4)   Claretha Cooper 11/02/2014, 11:19 AM Tresa Endo PT [phone]

## 2014-11-02 NOTE — Telephone Encounter (Signed)
[employer] has APPROVED the LIDOCAINE & ONDANSETRON and its good til 11/02/2015.      P: [phone]      COPY SCANNED

## 2014-11-02 NOTE — ED Notes (Signed)
Patient has had 2 episodes of brief verbal unresponsiveness, lasting < 5 seconds, no change in vital signs, patient just stares off and does not respond verbally to questioning or his name.  After the brief episode, patient will resume brief responses to questions.

## 2014-11-02 NOTE — H&P (Signed)
[facility] History and Physical  [name] MBT:[id number] DOB: [birth year] DOA: 11/01/2014  Referring physician: ED physician PCP: Shirline Frees, MD  Specialists:   Chief Complaint: Nausea, vomiting, abdominal pain  HPI: [name] is a 69 y.o. male with PMH of MDS on chemotherapy currently, hypertension, hyperlipidemia, GERD, depression, anxiety, CAD (as/PE of stent placement), diverticulitis, hypotestosteronism, DVT, COPD, shingles, who presented with nausea, vomiting and abdominal pain.  Patient is currently receiving chemotherapy for MDS and last dose was two days ago. He states that he has been having severe nausea, dry heave, vomiting and abdominal pain. His abdominal pain is located in the periumbilical area, constant, 10 out of 10 in severity, nonradiating. He states that his abdominal pain is related to dry heave. His abdominal pain subsides when he is dry heaving is controlled. Patient does not have fever, chills, diarrhea. He states that he had loose stool several days ago, which has already resolved. He had only one bowel movement yesterday with soft stool. He states that he has acid reflux. He does not have cough, shortness of breath, chest pain, fever or chills. No symptoms of UTI, negative edema or unilateral weakness. He has shingle rashes over his right abdomen which are scabbed.  In ED, patient was found to have lactic acid 1.42-->0.40, negative troponin, negative urinalysis, negative lipase, pancytopenia with WBC 0.9, hemoglobin 7.3, platelet 17, creatinine 1.2, BUN 27, potassium normal.  CT-head is negative. CT-abd/pelvis showed no acute abnormality to explain the patient's symptoms. X-ray of chest/abd showed unremarkable bowel gas pattern; no free intra-abdominal air seen; mild left-sided airspace opacity could reflect a loculated pleural effusion or mild infection, slightly less prominent than on the recent chest radiograph on 10/21/14.  Where does patient live?    At home    Can patient participate in ADLs?  Some   Review of Systems:   General: no fevers, chills, no changes in body weight, has poor appetite, has fatigue HEENT: no blurry vision, hearing changes or sore throat Pulm: no dyspnea, coughing, wheezing CV: no chest pain, palpitations Abd: has nausea, vomiting, abdominal pain, no diarrhea, constipation GU: no dysuria, burning on urination, increased urinary frequency, hematuria  Ext: no leg edema Neuro: no unilateral weakness, numbness, or tingling, no vision change or hearing loss Skin: no rash MSK: No muscle spasm, no deformity, no limitation of range of movement in spin Heme: No easy bruising.  Travel history: No recent long distant travel.  Allergy:  Allergies  Allergen Reactions  . Albuterol-Ipratropium-Soybean Lecithin [Ipratropium-Albuterol] Anxiety  . Cephalexin Other (See Comments)    Nausea and stomach cramps  . Compazine [Prochlorperazine] Anxiety  . Percocet [Oxycodone-Acetaminophen] Itching    Can take with benadryl    . Phenergan [Promethazine Hcl] Other (See Comments)    "restless legs"  . Tramadol Other (See Comments)    "jerking limbs and talking in his sleep"  . Vicodin [Hydrocodone-Acetaminophen] Itching    Can take with benadryl   . Marinol [Dronabinol] Other (See Comments)    "Affects his eyes"  . Reglan [Metoclopramide] Other (See Comments)    Patient has significant shakes that are troubling. Would not like to receive this if there are other options available.    Past Medical History  Diagnosis Date  . Coronary atherosclerosis of native coronary artery     a. 10/2008 inf STEMI/PCI: LM 50d (IVUS-borderline lesion->med rx), LAD min irregs, LCX 36m, 70d, OM nl, RCA 151m (3.5x28 Vision BMS);  b. 11/2008 Lexiscan MV: EF 65%,  no isch/scar.  . Essential hypertension, benign   . Pure hypercholesterolemia   . AAA (abdominal aortic aneurysm) ([place])     a. 05/2013 CT: 5.8 cm AAA.  Marland Kitchen Diverticulitis     a.  05/2013 CT: descending/sigmoid jxn w/o abscess.  . Osteoarthritis   . Tobacco abuse     a. ongoing - 1ppd for better part of 50 yrs.  . Normocytic anemia   . Cellulitis 06/10/2013    Right antecubital fossa at site of IV  03/12/13  . PONV (postoperative nausea and vomiting)   . Diverticulitis   . GERD (gastroesophageal reflux disease)   . MDS (myelodysplastic syndrome), high grade ([place]) 04/13/2014  . Myocardial infarction ([place]) 2010  . Depression   . Spontaneous pneumothorax 09/14/2014    LEFT LUNG  . Hypotestosteronemia 10/04/2014    Past Surgical History  Procedure Laterality Date  . Cardiac stents  2010  . Cholecystectomy N/A 06/19/2013    Procedure: LAPAROSCOPIC CHOLECYSTECTOMY;  Surgeon: Harl Bowie, MD;  Location: [facility];  Service: General;  Laterality: N/A;  . Abdominal aortic endovascular stent graft N/A 07/02/2013    Procedure: ABDOMINAL AORTIC ENDOVASCULAR STENT GRAFT;  Surgeon: Serafina Mitchell, MD;  Location[facility];  Service: Vascular;  Laterality: N/A;  . Esophagogastroduodenoscopy N/A 08/05/2013    Procedure: ESOPHAGOGASTRODUODENOSCOPY (EGD);  Surgeon: Lear Ng, MD;  Location: [facility] ENDOSCOPY;  Service: Endoscopy;  Laterality: N/A;  . Eye surgery Left     cataract surgery  . Back surgery      cervical fusion  . Colonoscopy with propofol N/A 10/15/2013    Procedure: COLONOSCOPY WITH PROPOFOL;  Surgeon: Lear Ng, MD;  Location: [facility];  Service: Endoscopy;  Laterality: N/A;  . Fetal blood transfusion  March 16,17,18, 2016  . Bone marrow biopsy  April 01, 2014  . Chest tube insertion  09/14/2014  . Video assisted thoracoscopy Left 10/11/2014    Procedure: VIDEO ASSISTED THORACOSCOPY;  Surgeon: Ivin Poot, MD;  Location: [facility];  Service: Thoracic;  Laterality: Left;  . Stapling of blebs Left 10/11/2014    Procedure: STAPLING OF BLEBS;  Surgeon: Ivin Poot, MD;  Location: [facility];  Service: Thoracic;  Laterality: Left;    Social History:   reports that he has quit smoking. His smoking use included Cigarettes. He started smoking about 50 years ago. He has a 60 pack-year smoking history. He has never used smokeless tobacco. He reports that he does not drink alcohol or use illicit drugs.  Family History:  Family History  Problem Relation Age of Onset  . Heart attack Brother     61s  . Cancer Father     Lung  . Cancer Mother     Brain  . Cancer Brother     Kidney     Prior to Admission medications   Medication Sig Start Date End Date Taking? Authorizing Provider  acetaminophen (TYLENOL) 500 MG tablet Take 500 mg by mouth every 6 (six) hours as needed for fever.   Yes Historical Provider, MD  ALPRAZolam (XANAX) 0.25 MG tablet Take 1 tablet (0.25 mg total) by mouth 3 (three) times daily as needed for anxiety. 10/03/14  Yes Volanda Napoleon, MD  aspirin EC 81 MG tablet Take 1 tablet (81 mg total) by mouth daily. 03/30/14  Yes Sherren Mocha, MD  carboxymethylcellulose (REFRESH TEARS) 0.5 % SOLN Place 2 drops into both eyes 3 (three) times daily as needed (For dry eyes.).   Yes Historical  Provider, MD  diphenhydrAMINE (BENADRYL) 25 mg capsule Take 1 capsule (25 mg total) by mouth every 6 (six) hours as needed for itching. May take with Percocet PRN 10/17/14  Yes Donielle Liston Alba, PA-C  famciclovir (FAMVIR) 500 MG tablet Take 1 tablet (500 mg total) by mouth daily. 10/27/14  Yes Volanda Napoleon, MD  famotidine (PEPCID AC) 10 MG chewable tablet Chew 10 mg by mouth 2 (two) times daily.   Yes Historical Provider, MD  feeding supplement, ENSURE ENLIVE, (ENSURE ENLIVE) LIQD Take 237 mLs by mouth 2 (two) times daily between meals. 10/17/14  Yes Donielle Liston Alba, PA-C  HYDROcodone-acetaminophen (NORCO/VICODIN) 5-325 MG tablet Take 1 tablet by mouth every 6 (six) hours as needed for moderate pain. 11/01/14  Yes Eliezer Bottom, NP  lidocaine-prilocaine (EMLA) cream Apply 1 application topically as needed. Please dispense 2 tubes 10/12/14   Yes Volanda Napoleon, MD  LORazepam (ATIVAN) 0.5 MG tablet Take 1 tablet (0.5 mg total) by mouth every 6 (six) hours as needed (Nausea or vomiting). 04/11/14  Yes Volanda Napoleon, MD  metoprolol tartrate (LOPRESSOR) 25 MG tablet Take 0.5 tablets (12.5 mg total) by mouth 2 (two) times daily. 08/03/13  Yes Sherren Mocha, MD  nitroGLYCERIN (NITROSTAT) 0.4 MG SL tablet Place 1 tablet (0.4 mg total) under the tongue every 5 (five) minutes as needed for chest pain. 03/29/14  Yes Sherren Mocha, MD  ondansetron (ZOFRAN ODT) 4 MG disintegrating tablet Take 1 tablet (4 mg total) by mouth every 8 (eight) hours as needed for nausea or vomiting. 11/01/14  Yes Volanda Napoleon, MD  Potassium Chloride 40 MEQ/15ML (20%) SOLN Take 40 mEq by mouth 2 (two) times daily. Patient taking differently: Take 40 mEq by mouth every other day.  10/19/14  Yes Volanda Napoleon, MD  simvastatin (ZOCOR) 20 MG tablet Take 1 tablet (20 mg total) by mouth at bedtime. 03/30/14  Yes Sherren Mocha, MD  temazepam (RESTORIL) 15 MG capsule Take 15 mg by mouth at bedtime as needed for sleep.   Yes Historical Provider, MD  testosterone cypionate (DEPOTESTOTERONE CYPIONATE) 100 MG/ML injection Inject 400 mg into the muscle every 28 (twenty-eight) days. For IM use only   Yes Historical Provider, MD  lidocaine (LIDODERM) 5 % Place 1 patch onto the skin daily. Remove & Discard patch within 12 hours or as directed by MD 10/31/14   Volanda Napoleon, MD  potassium chloride SA (K-DUR,KLOR-CON) 20 MEQ tablet Take 2 tablets (40 mEq total) by mouth 2 (two) times daily. Patient not taking: Reported on 11/01/2014 10/20/14   Volanda Napoleon, MD  thiamine 100 MG tablet Take 1 tablet (100 mg total) by mouth daily. Patient not taking: Reported on 11/01/2014 10/17/14   Nani Skillern, PA-C    Physical Exam: Filed Vitals:   11/02/14 0201 11/02/14 0300 11/02/14 0330 11/02/14 0351  BP: 131/68 111/68 111/85 111/85  Pulse: 70   67  Temp: 98 F (36.7 C)   97.9  F (36.6 C)  TempSrc: Oral   Oral  Resp: $Remo'17 13 16 14  'tQwhI$ SpO2: 99%   99%   General: Not in acute distress HEENT:       Eyes: PERRL, EOMI, no scleral icterus.       ENT: No discharge from the ears and nose, no pharynx injection, no tonsillar enlargement.        Neck: No JVD, no bruit, no mass felt. Heme: No neck lymph node enlargement. Cardiac: S1/S2, RRR, No murmurs,  No gallops or rubs. Pulm: No rales, wheezing, rhonchi or rubs. There is small chest tube surgical site over left lower chest wall, healing well. Abd: Soft, nondistended, tenderness over central abdomen, no rebound pain, no organomegaly, BS present. Ext: No pitting leg edema bilaterally. 2+DP/PT pulse bilaterally. Musculoskeletal: No joint deformities, No joint redness or warmth, no limitation of ROM in spin. Skin: has scabbed shingles over R abdomen. Neuro: Alert, oriented X3, cranial nerves II-XII grossly intact, muscle strength 5/5 in all extremities, sensation to light touch intact.  Psych: Patient is not psychotic, no suicidal or hemocidal ideation.  Labs on Admission:  Basic Metabolic Panel:  Recent Labs Lab 10/31/14 0922 11/02/14 0002  NA 134 139  K 3.7 4.2  CL 104 105  CO2 25  --   GLUCOSE 107 149*  BUN 16 27*  CREATININE 1.1 1.20  CALCIUM 8.5  --    Liver Function Tests:  Recent Labs Lab 10/31/14 0922 11/01/14 2351  AST 19 22  ALT 18 15*  ALKPHOS 76 78  BILITOT 0.70 0.6  PROT 7.5 7.8  ALBUMIN 3.0* 3.1*    Recent Labs Lab 11/01/14 2351  LIPASE 29   No results for input(s): AMMONIA in the last 168 hours. CBC:  Recent Labs Lab 10/31/14 0921 11/01/14 2351 11/02/14 0002  WBC 1.8* 0.9*  --   NEUTROABS  --  0.7*  --   HGB 5.8* 7.3* 7.5*  HCT 17.8* 21.6* 22.0*  MCV 87 82.8  --   PLT 19* 17*  --    Cardiac Enzymes: No results for input(s): CKTOTAL, CKMB, CKMBINDEX, TROPONINI in the last 168 hours.  BNP (last 3 results) No results for input(s): BNP in the last 8760 hours.  ProBNP  (last 3 results) No results for input(s): PROBNP in the last 8760 hours.  CBG: No results for input(s): GLUCAP in the last 168 hours.  Radiological Exams on Admission: Ct Head Wo Contrast  11/02/2014  CLINICAL DATA:  Altered mental status. Episodes of verbal unresponsiveness. Severe nausea. EXAM: CT HEAD WITHOUT CONTRAST TECHNIQUE: Contiguous axial images were obtained from the base of the skull through the vertex without intravenous contrast. COMPARISON:  None. FINDINGS: Mild generalized cerebral and cerebellar atrophy.No intracranial hemorrhage, mass effect, or midline shift. No hydrocephalus. The basilar cisterns are patent. No evidence of territorial infarct. No intracranial fluid collection. Calvarium is intact. Included paranasal sinuses and mastoid air cells are well aerated. IMPRESSION: Mild atrophy.  No acute intracranial abnormality. Electronically Signed   By: Jeb Levering M.D.   On: 11/02/2014 00:50   Ct Abdomen Pelvis W Contrast  11/02/2014  CLINICAL DATA:  Acute onset of nausea and vomiting, status post recent chemotherapy. Known bone metastases. Generalized abdominal pain and neutropenia. Initial encounter. EXAM: CT ABDOMEN AND PELVIS WITH CONTRAST TECHNIQUE: Multidetector CT imaging of the abdomen and pelvis was performed using the standard protocol following bolus administration of intravenous contrast. CONTRAST:  186mL OMNIPAQUE IOHEXOL 300 MG/ML  SOLN COMPARISON:  CT of the chest, abdomen and pelvis performed 03/30/2014 FINDINGS: Minimal left basilar scarring is noted. Scattered coronary artery calcifications are seen. The liver is unremarkable in appearance. The spleen is enlarged, measuring 15.7 cm in length. The patient is status post cholecystectomy, with clips noted at the gallbladder fossa. The pancreas and adrenal glands are unremarkable. Small bilateral renal cysts are seen, measuring up to 2.3 cm in size. The kidneys are otherwise unremarkable in appearance. There is no  evidence of hydronephrosis. No renal or ureteral stones  are seen. No perinephric stranding is appreciated. No free fluid is identified. The small bowel is unremarkable in appearance. The stomach is within normal limits. No acute vascular abnormalities are seen. An IVC filter is noted. The patient's aortoiliac stent graft is grossly unremarkable in appearance. The appendix is normal in caliber, without evidence of appendicitis. Scattered diverticulosis is noted along the descending and sigmoid colon, without evidence of diverticulitis. The bladder is mildly distended and grossly unremarkable. The prostate is enlarged, measuring 5.3 cm in transverse dimension. No inguinal lymphadenopathy is seen. No acute osseous abnormalities are identified. Vacuum phenomenon is noted at L5-S1, with endplate sclerotic change. IMPRESSION: 1. No acute abnormality seen to explain the patient's symptoms. 2. Scattered diverticulosis along the descending and sigmoid colon, without evidence of diverticulitis. 3. Aortoiliac stent graft is grossly unremarkable in appearance. 4. Small bilateral renal cysts seen. 5. Scattered coronary artery calcifications noted. 6. Splenomegaly noted. 7. Enlarged prostate seen. Electronically Signed   By: Garald Balding M.D.   On: 11/02/2014 02:54   Dg Abd Acute W/chest  11/02/2014  CLINICAL DATA:  Acute onset of severe nausea. Patient on radiation therapy and chemotherapy for bone cancer. Initial encounter. EXAM: DG ABDOMEN ACUTE W/ 1V CHEST COMPARISON:  Chest radiograph performed 10/21/2014, and abdominal radiograph performed 10/12/2014 FINDINGS: The lungs are well-aerated. Mild left-sided airspace opacity could reflect a loculated effusion or mild infection. There is no evidence of pneumothorax. The cardiomediastinal silhouette is borderline enlarged. A right-sided chest port is noted ending about the distal SVC. The visualized bowel gas pattern is unremarkable. Scattered stool and air are seen within  the colon; there is no evidence of small bowel dilatation to suggest obstruction. No free intra-abdominal air is identified on the provided decubitus view. No acute osseous abnormalities are seen; the sacroiliac joints are unremarkable in appearance. An IVC filter is noted. An aortoiliac stent graft is seen. Clips are noted within the right upper quadrant, reflecting prior cholecystectomy. IMPRESSION: 1. Unremarkable bowel gas pattern; no free intra-abdominal air seen. 2. Mild left-sided airspace opacity could reflect a loculated pleural effusion or mild infection, slightly less prominent than on the recent chest radiograph. 3. Borderline cardiomegaly. Electronically Signed   By: Garald Balding M.D.   On: 11/02/2014 01:02    EKG: Independently reviewed.  Abnormal findings:  QTC 462, U wave in V2-V4   Assessment/Plan Principal Problem:   Nausea & vomiting Active Problems:   Essential hypertension   MYOCARDIAL INFARCTION, HX OF   Coronary atherosclerosis of native coronary artery   Malnutrition of moderate degree (HCC)   Pancytopenia (HCC)   MDS (myelodysplastic syndrome), high grade ([facility])   Spontaneous pneumothorax   Hypotestosteronemia   Pneumothorax, left   Acid reflux   Abdominal pain  Nausea & vomiting and AP: Etiology is not clear. Lipase is negative. CT abdomen/pelvis did not show acute abnormalities to explain his symptoms. Patient's lactate is normal and AG is 13, indicating less likely to have mesenteric ischemia. The nausea and vomiting are most likely caused by chemotherapy. Patient states that his abdominal pain is related to dry heave, indicating possible abdominal muscle pain. His shingles may have also contributed partially though the location dose not match his AP. X-ray of chest/abd showed mild left-sided airspace opacity could reflect loculated pleural effusion or mild infection, but this is slightly less prominent than on the recent chest radiograph on 10/21/14. Patient does not  have fever and chills. No cough, chest pain or shortness of breath. His lactate is normal,  no signs of sepsis. We'll hold antibiotics now.  -Will admit to tele bed -When necessary Zofran for nausea, Dilaudid for pain -Continue home Norco -IVF: 1L NS and then 100cc/h  MDS (myelodysplastic syndrome), high grade ([place]): has been treated with chemotherapy by Dr. Marin Olp. Last dose was on 10/171/6. -follow up with Dr. Marin Olp  CAD: s/p stent 2010. No chest pain -ASA and metoprolol -prn NTG  HLD: Last LDL was 30 on 05/26/14 -Continue home medications: Zocor  HTN: -Metoprolol  Malnutrition of moderate degree ([place]): -Ensure  Pancytopenia ([place]): Most likely due to chemotherapy. Patient was transfused with 2 units of blood on 10/31/14 for hemoglobin of 5.8. -follow up by cbc  GERD: -Pepcid  Spontaneous pneumothorax: s/p of chest tube removal. No new issues now   DVT ppx: SCD  Code Status: Full code Family Communication: None at bed side.    Disposition Plan: Admit to inpatient   Date of Service 11/02/2014    Ivor Costa [facility] Pager [phone]  If 7PM-7AM, please contact night-coverage [email or web address] Password [facility] 11/02/2014, 4:38 AM

## 2014-11-02 NOTE — Progress Notes (Signed)
OT Cancellation Note  Patient Details Name: [name] MRN: [id number] DOB: 1945/06/02   Cancelled Treatment:    Reason Eval/Treat Not Completed: Other (comment) Pt to receive blood and note Hgb 6.4. Will try back later today or next date.  [place], [place] 11/02/2014, 8:34 AM

## 2014-11-02 NOTE — Progress Notes (Signed)
Pt HR sustaining in the low 50's, MD aware.  Will monitor closely.

## 2014-11-02 NOTE — ED Notes (Signed)
Patient with decreased moaning after Fentanyl IV, continues to complain of nausea

## 2014-11-02 NOTE — Progress Notes (Addendum)
Patient ID: [name], [name]   DOB: 01/23/1945, 70 y.o.   MRN: [id number]  [facility] PROGRESS NOTE  [name] [id number] DOB: 03-29-1945 DOA: 11/01/2014 PCP: Darrell Frees, MD   Brief narrative:     69 y.o. [name] with PMH of MDS on chemotherapy currently, last dose was two days PTA, hypertension, hyperlipidemia, GERD, depression, anxiety, CAD (as/PE of stent placement), diverticulitis, hypotestosteronism, DVT, COPD, shingles, who presented with nausea, vomiting and periumbilical abdominal pain.  In ED, patient was found to have lactic acid 1.42-->0.40, negative troponin, negative urinalysis, negative lipase, pancytopenia with WBC 0.9, hemoglobin 7.3, platelet 17, creatinine 1.2, BUN 27, potassium normal. CT-head is negative. CT-abd/pelvis showed no acute abnormality to explain the patient's symptoms. X-ray of chest/abd showed unremarkable bowel gas pattern; no free intra-abdominal air seen; mild left-sided airspace opacity could reflect a loculated pleural effusion or mild infection, slightly less prominent than on the recent chest radiograph on 10/21/14.  Assessment/Plan:    Principal Problem:   Nausea & vomiting, abd pain  - Etiology is not clear. Lipase is negative - CT abdomen/pelvis did not show acute abnormalities to explain his symptoms.  - N/V likely related to chemo - review of records indicate pt has known history of mild gastritis and duodenitis, so ? If this is contributing - will place on PPI IV BID, consider GI input if symptoms not better - advance diet to clear liquids, continue analgesia and antiemetics as needed   Active Problems:   Pancytopenia ([place])  - related to chemotherapy  - transfused two units of PRBC in caner center, 1 U PRBC today - also transfuse one U of Plt  - d/w oncologist about neupogen as well  - CBC with diff in AM    MDS (myelodysplastic syndrome), high grade ([place]) - per oncology team    Pneumothorax, left, recent history -  respiratory status is stable this AM    Acid reflux - place on PPI Protonix IV BID     Protein-calorie malnutrition, severe ([place]) - appreciate nutritionist consultation     Essential hypertension - reasonable inpatient control     Coronary atherosclerosis of native coronary artery - continue metoprolol - hold aspirin due to pancytopenia     COPD (chronic obstructive pulmonary disease) ([place]) - stable respiratory status this AM  DVT prophylaxis - SCD  Code Status: Full.  Family Communication:  plan of care discussed with the patient Disposition Plan: Home when stable.   IV access:  Peripheral IV  Procedures and diagnostic studies:     Ct Head Wo Contrast 2014/11/05  Mild atrophy.  No acute intracranial abnormality.   Dg Abd Acute W/chest Nov 05, 2014 Unremarkable bowel gas pattern; no free intra-abdominal air seen. 2. Mild left-sided airspace opacity could reflect a loculated pleural effusion or mild infection, slightly less prominent than on the recent chest radiograph. 3. Borderline cardiomegaly.  Medical Consultants:  Oncology    Other Consultants:  None  IAnti-Infectives:   None  Faye Ramsay, MD  [facility] Pager [phone]  If 7PM-7AM, please contact night-coverage [email or web address] Password [email or web address] 11/05/14, 12:40 PM   LOS: 0 days   HPI/Subjective: No events overnight. Better but still with epigastric discomfort.   Objective: Filed Vitals:   November 05, 2014 0400 11/05/2014 0513 Nov 05, 2014 1045 Nov 05, 2014 1115  BP: 115/68 128/67 100/64 113/62  Pulse:  81 60 55  Temp:  98.3 F (36.8 C) 98.2 F (36.8 C) 97.7 F (36.5 C)  TempSrc:  Oral Oral Oral  Resp: 14 16 18  20  Height:  5\' 10"  (1.778 m)    Weight:  76.3 kg (168 lb 3.4 oz)    SpO2:  99% 100% 100%    Intake/Output Summary (Last 24 hours) at 11/02/14 1240 Last data filed at 11/02/14 1115  Gross per 24 hour  Intake    530 ml  Output    240 ml  Net    290 ml    Exam:   General:  Pt is alert, follows  commands appropriately, not in acute distress  Cardiovascular: Regular rhythm, bradycardic, S1/S2, no rubs, no gallops  Respiratory: Clear to auscultation bilaterally, no wheezing, diminished breath sounds at bases   Abdomen: Soft, tender in epigastric area, non distended, bowel sounds present, no guarding  Extremities: pulses DP and PT palpable bilaterally   Data Reviewed: Basic Metabolic Panel:  Recent Labs Lab 10/31/14 0922 11/02/14 0002 11/02/14 0443  NA 134 139 137  K 3.7 4.2 3.6  CL 104 105 108  CO2 25  --  22  GLUCOSE 107 149* 106*  BUN 16 27* 21*  CREATININE 1.1 1.20 1.02  CALCIUM 8.5  --  7.7*   Liver Function Tests:  Recent Labs Lab 10/31/14 0922 11/01/14 2351 11/02/14 0443  AST 19 22 15   ALT 18 15* 14*  ALKPHOS 76 78 69  BILITOT 0.70 0.6 0.8  PROT 7.5 7.8 6.6  ALBUMIN 3.0* 3.1* 2.7*    Recent Labs Lab 11/01/14 2351  LIPASE 29   CBC:  Recent Labs Lab 10/31/14 0921 11/01/14 2351 11/02/14 0002 11/02/14 0443  WBC 1.8* 0.9*  --  0.8*  NEUTROABS  --  0.7*  --   --   HGB 5.8* 7.3* 7.5* 6.4*  HCT 17.8* 21.6* 22.0* 18.9*  MCV 87 82.8  --  84.4  PLT 19* 17*  --  13*   CBG:  Recent Labs Lab 11/02/14 0732  GLUCAP 92   Scheduled Meds: . sodium chloride   Intravenous Once  . aspirin EC  81 mg Oral Daily  . famciclovir  500 mg Oral Daily  . famotidine  10 mg Oral BID  . feeding supplement  1 Container Oral BID BM  . feeding supplement (ENSURE ENLIVE)  237 mL Oral BID BM  . lidocaine  1 patch Transdermal Q24H  . metoprolol tartrate  12.5 mg Oral BID  . pantoprazole (PROTONIX) IV  40 mg Intravenous Q12H  . simvastatin  20 mg Oral QHS  . sodium chloride  3 mL Intravenous Q12H   Continuous Infusions: . sodium chloride 100 mL/hr at 11/02/14 [phone]

## 2014-11-02 NOTE — Progress Notes (Signed)
CRITICAL VALUE ALERT  Critical value received:  hgb 6.4  Date of notification:  11/02/2014    Time of notification:  5:56 AM   Critical value read back:Yes.    Nurse who received alert:  Jeanie Sewer   MD notified (1st page)NP K [facility]  Time of first page:  5:56 AM   MD notified (2nd page):  Time of second page:  Responding MD:    Time MD responded:

## 2014-11-03 ENCOUNTER — Ambulatory Visit: Payer: Medicare Other

## 2014-11-03 ENCOUNTER — Encounter (HOSPITAL_COMMUNITY): Payer: Self-pay | Admitting: Radiology

## 2014-11-03 ENCOUNTER — Inpatient Hospital Stay (HOSPITAL_COMMUNITY): Payer: Medicare Other

## 2014-11-03 DIAGNOSIS — R109 Unspecified abdominal pain: Secondary | ICD-10-CM

## 2014-11-03 DIAGNOSIS — N400 Enlarged prostate without lower urinary tract symptoms: Secondary | ICD-10-CM

## 2014-11-03 DIAGNOSIS — D469 Myelodysplastic syndrome, unspecified: Secondary | ICD-10-CM

## 2014-11-03 DIAGNOSIS — B029 Zoster without complications: Secondary | ICD-10-CM

## 2014-11-03 DIAGNOSIS — R161 Splenomegaly, not elsewhere classified: Secondary | ICD-10-CM

## 2014-11-03 LAB — CBC WITH DIFFERENTIAL/PLATELET
BASOS PCT: 0 %
Basophils Absolute: 0 10*3/uL (ref 0.0–0.1)
EOS PCT: 0 %
Eosinophils Absolute: 0 10*3/uL (ref 0.0–0.7)
HEMATOCRIT: 20.9 % — AB (ref 39.0–52.0)
Hemoglobin: 7.1 g/dL — ABNORMAL LOW (ref 13.0–17.0)
LYMPHS ABS: 0.6 10*3/uL — AB (ref 0.7–4.0)
Lymphocytes Relative: 48 %
MCH: 28.9 pg (ref 26.0–34.0)
MCHC: 34 g/dL (ref 30.0–36.0)
MCV: 85 fL (ref 78.0–100.0)
MONO ABS: 0.1 10*3/uL (ref 0.1–1.0)
Monocytes Relative: 5 %
NEUTROS PCT: 47 %
Neutro Abs: 0.5 10*3/uL — ABNORMAL LOW (ref 1.7–7.7)
PLATELETS: 32 10*3/uL — AB (ref 150–400)
RBC: 2.46 MIL/uL — ABNORMAL LOW (ref 4.22–5.81)
RDW: 14.3 % (ref 11.5–15.5)
WBC: 1.2 10*3/uL — CL (ref 4.0–10.5)

## 2014-11-03 LAB — GLUCOSE, CAPILLARY: Glucose-Capillary: 93 mg/dL (ref 65–99)

## 2014-11-03 LAB — PREPARE PLATELET PHERESIS: UNIT DIVISION: 0

## 2014-11-03 LAB — BASIC METABOLIC PANEL
Anion gap: 7 (ref 5–15)
BUN: 15 mg/dL (ref 6–20)
CHLORIDE: 107 mmol/L (ref 101–111)
CO2: 25 mmol/L (ref 22–32)
CREATININE: 1.02 mg/dL (ref 0.61–1.24)
Calcium: 8 mg/dL — ABNORMAL LOW (ref 8.9–10.3)
GFR calc Af Amer: >60 mL/min (ref >60)
GFR calc non Af Amer: >60 mL/min (ref >60)
Glucose, Bld: 82 mg/dL (ref 65–99)
Potassium: 3.4 mmol/L — ABNORMAL LOW (ref 3.5–5.1)
Sodium: 139 mmol/L (ref 135–145)

## 2014-11-03 LAB — TYPE AND SCREEN
ABO/RH(D): A POS
ANTIBODY SCREEN: NEGATIVE
Unit division: 0

## 2014-11-03 MED ORDER — IOHEXOL 300 MG/ML  SOLN
75.0000 mL | Freq: Once | INTRAMUSCULAR | Status: AC | PRN
  Administered 2014-11-03: 75 mL via INTRAVENOUS

## 2014-11-03 MED ORDER — HYDROMORPHONE HCL 1 MG/ML IJ SOLN
1.0000 mg | INTRAMUSCULAR | Status: DC | PRN
  Administered 2014-11-03 – 2014-11-08 (×33): 1 mg via INTRAVENOUS
  Filled 2014-11-03 (×33): qty 1

## 2014-11-03 MED ORDER — OLANZAPINE 5 MG PO TABS
5.0000 mg | ORAL_TABLET | Freq: Every day | ORAL | Status: DC
  Administered 2014-11-03 – 2014-11-08 (×5): 5 mg via ORAL
  Filled 2014-11-03 (×8): qty 1

## 2014-11-03 MED ORDER — PREDNISONE 50 MG PO TABS
50.0000 mg | ORAL_TABLET | Freq: Every day | ORAL | Status: DC
  Administered 2014-11-03: 50 mg via ORAL
  Filled 2014-11-03 (×3): qty 1

## 2014-11-03 MED ORDER — DOXYCYCLINE HYCLATE 100 MG PO TABS
100.0000 mg | ORAL_TABLET | Freq: Two times a day (BID) | ORAL | Status: DC
  Administered 2014-11-03 – 2014-11-04 (×3): 100 mg via ORAL
  Filled 2014-11-03 (×5): qty 1

## 2014-11-03 MED ORDER — DIPHENHYDRAMINE HCL 50 MG PO CAPS
50.0000 mg | ORAL_CAPSULE | Freq: Once | ORAL | Status: AC
  Administered 2014-11-03: 50 mg via ORAL
  Filled 2014-11-03: qty 1

## 2014-11-03 MED ORDER — SODIUM CHLORIDE 0.9 % IV SOLN
INTRAVENOUS | Status: AC
  Administered 2014-11-03: 16:00:00 via INTRAVENOUS

## 2014-11-03 MED ORDER — FILGRASTIM 480 MCG/1.6ML IJ SOLN
480.0000 ug | Freq: Every day | INTRAMUSCULAR | Status: DC
  Administered 2014-11-03 – 2014-11-06 (×4): 480 ug via SUBCUTANEOUS
  Filled 2014-11-03 (×4): qty 1.6

## 2014-11-03 NOTE — Progress Notes (Signed)
Patient ID: [name], male   DOB: 08-Nov-1945, 69 y.o.   MRN: [id number]  [facility] PROGRESS NOTE  [name] [id number] DOB: 12/05/1945 DOA: 11/01/2014 PCP: Shirline Frees, MD   Brief narrative:     68 y.o. male with PMH of MDS on chemotherapy currently, last dose was two days PTA, hypertension, hyperlipidemia, GERD, depression, anxiety, CAD (as/PE of stent placement), diverticulitis, hypotestosteronism, DVT, COPD, shingles, who presented with nausea, vomiting and periumbilical abdominal pain.  In ED, patient was found to have lactic acid 1.42-->0.40, negative troponin, negative urinalysis, negative lipase, pancytopenia with WBC 0.9, hemoglobin 7.3, platelet 17, creatinine 1.2, BUN 27, potassium normal. CT-head is negative. CT-abd/pelvis showed no acute abnormality to explain the patient's symptoms. X-ray of chest/abd showed unremarkable bowel gas pattern; no free intra-abdominal air seen; mild left-sided airspace opacity could reflect a loculated pleural effusion or mild infection, slightly less prominent than on the recent chest radiograph on 10/21/14.  Assessment/Plan:    Principal Problem:   Nausea & vomiting, abd pain  - Etiology is not clear. Lipase is negative - CT abdomen/pelvis did not show acute abnormalities to explain his symptoms.  - N/V possibly related to chemo but abd pain etiology still unclear  - review of records indicate pt has known history of mild gastritis and duodenitis, so ? If this is contributing - continue PPI BID, GI team consulted for assistance  - currently on clear liquids, will on same diet until seen by GI team  Active Problems:   Pancytopenia ([place]) - related to chemotherapy  - transfused two units of PRBC in [facility]er center, 1 U PRBC November 06, 2022 - also transfused one U of Plt 11/06/2022 - neupogen will be given today  - blood counts overall improving  - CBC with diff in AM    Bradycardia - stopped metoprolol until HR stabilizes     MDS  (myelodysplastic syndrome), high grade ([place]) - per oncology team    Pneumothorax, left, recent history - respiratory status is stable this AM    Acid reflux - continue PPI Protonix IV BID     Protein-calorie malnutrition, severe ([place]) - appreciate nutritionist consultation     Essential hypertension - reasonable inpatient control     Coronary atherosclerosis of native coronary artery - continue metoprolol - hold aspirin due to pancytopenia     COPD (chronic obstructive pulmonary disease) ([place]) - stable respiratory status this AM  DVT prophylaxis - SCD  Code Status: Full.  Family Communication:  plan of care discussed with the patient Disposition Plan: Home when stable.   IV access:  Peripheral IV  Procedures and diagnostic studies:     Ct Head Wo Contrast 11-06-2014  Mild atrophy.  No acute intracranial abnormality.   Dg Abd Acute W/chest 11-06-14 Unremarkable bowel gas pattern; no free intra-abdominal air seen. 2. Mild left-sided airspace opacity could reflect a loculated pleural effusion or mild infection, slightly less prominent than on the recent chest radiograph. 3. Borderline cardiomegaly.  Medical Consultants:  Oncology   GI  Other Consultants:  None  IAnti-Infectives:   None  Faye Ramsay, MD  [facility] Pager [phone]  If 7PM-7AM, please contact night-coverage [email or web address] Password [email or web address] 11/03/2014, 6:40 AM   LOS: 1 day   HPI/Subjective: No events overnight. Better but still with epigastric discomfort.   Objective: Filed Vitals:   Nov 06, 2014 1505 2014-11-06 1540 2014/11/06 1758 2014-11-06 2156  BP: 97/57 96/55 99/55  98/58  Pulse: 53 54 58 59  Temp: 97.9 F (36.6 C) 98  F (36.7 C) 97.9 F (36.6 C) 97.8 F (36.6 C)  TempSrc: Oral Oral Oral Oral  Resp: 18 18 18 18   Height:      Weight:      SpO2: 99% 99% 99% 100%    Intake/Output Summary (Last 24 hours) at 11/03/14 0640 Last data filed at 11/03/14 2993  Gross per 24 hour  Intake  2993.33 ml  Output   1775 ml  Net 1218.33 ml    Exam:   General:  Pt is alert, follows commands appropriately, not in acute distress  Cardiovascular: Regular rhythm, bradycardic, S1/S2, no rubs, no gallops  Respiratory: Clear to auscultation bilaterally, no wheezing, diminished breath sounds at bases   Abdomen: Soft, tender in epigastric area, non distended, bowel sounds present, no guarding  Extremities: pulses DP and PT palpable bilaterally   Data Reviewed: Basic Metabolic Panel:  Recent Labs Lab 10/31/14 0922 11/02/14 0002 11/02/14 0443 11/03/14 0520  NA 134 139 137 PENDING  K 3.7 4.2 3.6 PENDING  CL 104 105 108 PENDING  CO2 25  --  22 PENDING  GLUCOSE 107 149* 106* 82  BUN 16 27* 21* 15  CREATININE 1.1 1.20 1.02 1.02  CALCIUM 8.5  --  7.7* PENDING   Liver Function Tests:  Recent Labs Lab 10/31/14 0922 11/01/14 2351 11/02/14 0443  AST 19 22 15   ALT 18 15* 14*  ALKPHOS 76 78 69  BILITOT 0.70 0.6 0.8  PROT 7.5 7.8 6.6  ALBUMIN 3.0* 3.1* 2.7*    Recent Labs Lab 11/01/14 2351  LIPASE 29   CBC:  Recent Labs Lab 10/31/14 0921 11/01/14 2351 11/02/14 0002 11/02/14 0443 11/03/14 0520  WBC 1.8* 0.9*  --  0.8* 1.2*  NEUTROABS  --  0.7*  --   --  0.5*  HGB 5.8* 7.3* 7.5* 6.4* 7.1*  HCT 17.8* 21.6* 22.0* 18.9* 20.9*  MCV 87 82.8  --  84.4 85.0  PLT 19* 17*  --  13* 32*   CBG:  Recent Labs Lab 11/02/14 0732  GLUCAP 92   Scheduled Meds: . sodium chloride   Intravenous Once  . sodium chloride   Intravenous Once  . famciclovir  500 mg Oral Daily  . feeding supplement  1 Container Oral BID BM  . feeding supplement (ENSURE ENLIVE)  237 mL Oral BID BM  . lidocaine  1 patch Transdermal Q24H  . pantoprazole (PROTONIX) IV  40 mg Intravenous Q12H  . simvastatin  20 mg Oral QHS  . sodium chloride  3 mL Intravenous Q12H   Continuous Infusions: . sodium chloride 100 mL/hr at 11/03/14 0307

## 2014-11-03 NOTE — Consult Note (Signed)
Referral MD  Reason for Referral: Myelodysplasia. Nausea and vomiting. Recent herpes zoster infection of the right T9 dermatome   Chief Complaint  Patient presents with  . Severe nausea   . Abdominal Pain  : I'm having a lot of nausea.  HPI: Mr. Victory is well-known to me. He is a 69 year old white gentleman. He and 14 is had numerous issues since we started seeing him. he has myelodysplasia area and so far, he has not had much of a response.  He was found to have shingles in the right T9 dermatome about 2 weeks ago. He was started on Famvir. The lesions have dried up nicely. It is hard to say if he has any type of postherpetic neuralgia. He does have a lidocaine patch.  He started high-dose Vidaza on Monday. Apparently, he began to have a lot of nausea starting on Tuesday. He was subsequently admitted.  He has been in the hospital more than his been out of the hospital lately. He was in the hospital for a couple weeks for a recurrent left spontaneous pneumothorax.  When he was admitted, his white cell count is 1.8. Hemoglobin 5.8. Platelet count 19K. He has gotten some blood and platelets.  He did have a CT scan done. This was done on the 19th. It showed some diverticulosis. He had some coronary artery calcifications. He had mild sputum megaly. He had an enlarged prostate. There is no adenopathy.  He had blood cultures done. These were negative.  He still feels nauseated. He has some abdominal discomfort. I do not know if this might be from the shingles and that has some post herpetic neuralgia. I might sure the Famvir might be causing some of the nausea. It certainly would not be a problem to stop it.  He's had no bleeding. He's had no fever. He's had no diarrhea.  He's had no cough. He has some slight left-sided chest wall discomfort from where he had surgery for the pneumothorax.  His seizures if he keeps having problems. We just have not been able to treat the myelodysplasia for a  while.     Past Medical History  Diagnosis Date  . Coronary atherosclerosis of native coronary artery     a. 10/2008 inf STEMI/PCI: LM 50d (IVUS-borderline lesion->med rx), LAD min irregs, LCX 100m, 70d, OM nl, RCA 130m (3.5x28 Vision BMS);  b. 11/2008 Lexiscan MV: EF 65%, no isch/scar.  . Essential hypertension, benign   . Pure hypercholesterolemia   . AAA (abdominal aortic aneurysm) ([place])     a. 05/2013 CT: 5.8 cm AAA.  Marland Kitchen Diverticulitis     a. 05/2013 CT: descending/sigmoid jxn w/o abscess.  . Osteoarthritis   . Tobacco abuse     a. ongoing - 1ppd for better part of 50 yrs.  . Normocytic anemia   . Cellulitis 06/10/2013    Right antecubital fossa at site of IV  03/12/13  . PONV (postoperative nausea and vomiting)   . Diverticulitis   . GERD (gastroesophageal reflux disease)   . MDS (myelodysplastic syndrome), high grade ([place]) 04/13/2014  . Myocardial infarction ([place]) 2010  . Depression   . Spontaneous pneumothorax 09/14/2014    LEFT LUNG  . Hypotestosteronemia 10/04/2014  :  Past Surgical History  Procedure Laterality Date  . Cardiac stents  2010  . Cholecystectomy N/A 06/19/2013    Procedure: LAPAROSCOPIC CHOLECYSTECTOMY;  Surgeon: Harl Bowie, MD;  Location: [place];  Service: General;  Laterality: N/A;  . Abdominal aortic endovascular stent  graft N/A 07/02/2013    Procedure: ABDOMINAL AORTIC ENDOVASCULAR STENT GRAFT;  Surgeon: Serafina Mitchell, MD;  Location: [facility] OR;  Service: Vascular;  Laterality: N/A;  . Esophagogastroduodenoscopy N/A 08/05/2013    Procedure: ESOPHAGOGASTRODUODENOSCOPY (EGD);  Surgeon: Lear Ng, MD;  Location: [facility] ENDOSCOPY;  Service: Endoscopy;  Laterality: N/A;  . Eye surgery Left     cataract surgery  . Back surgery      cervical fusion  . Colonoscopy with propofol N/A 10/15/2013    Procedure: COLONOSCOPY WITH PROPOFOL;  Surgeon: Lear Ng, MD;  Location: [place];  Service: Endoscopy;  Laterality: N/A;  . Fetal blood  transfusion  March 16,17,18, 2016  . Bone marrow biopsy  April 01, 2014  . Chest tube insertion  09/14/2014  . Video assisted thoracoscopy Left 10/11/2014    Procedure: VIDEO ASSISTED THORACOSCOPY;  Surgeon: Ivin Poot, MD;  Location: [place];  Service: Thoracic;  Laterality: Left;  . Stapling of blebs Left 10/11/2014    Procedure: STAPLING OF BLEBS;  Surgeon: Ivin Poot, MD;  Location: [facility] OR;  Service: Thoracic;  Laterality: Left;  :   Current facility-administered medications:  .  0.9 %  sodium chloride infusion, , Intravenous, Continuous, Ivor Costa, MD, Last Rate: 100 mL/hr at 11/03/14 0307 .  0.9 %  sodium chloride infusion, , Intravenous, Once, [employer], NP .  0.9 %  sodium chloride infusion, , Intravenous, Once, Theodis Blaze, MD .  acetaminophen (TYLENOL) tablet 500 mg, 500 mg, Oral, Q6H PRN, Ivor Costa, MD .  ALPRAZolam Duanne Moron) tablet 0.25 mg, 0.25 mg, Oral, TID PRN, Ivor Costa, MD .  diphenhydrAMINE (BENADRYL) capsule 25 mg, 25 mg, Oral, Q6H PRN, Ivor Costa, MD .  feeding supplement (BOOST / RESOURCE BREEZE) liquid 1 Container, 1 Container, Oral, BID BM, Rosezetta Schlatter, RD, 1 Container at 11/02/14 1400 .  feeding supplement (ENSURE ENLIVE) (ENSURE ENLIVE) liquid 237 mL, 237 mL, Oral, BID BM, Ivor Costa, MD, 237 mL at 11/02/14 1000 .  HYDROmorphone (DILAUDID) injection 1 mg, 1 mg, Intravenous, Q4H PRN, Ivor Costa, MD, 1 mg at 11/02/14 0619 .  lidocaine (LIDODERM) 5 % 1 patch, 1 patch, Transdermal, Q24H, Ivor Costa, MD, 1 patch at 11/02/14 0850 .  lidocaine-prilocaine (EMLA) cream 1 application, 1 application, Topical, PRN, Ivor Costa, MD .  nitroGLYCERIN (NITROSTAT) SL tablet 0.4 mg, 0.4 mg, Sublingual, Q5 min PRN, Ivor Costa, MD .  OLANZapine (ZYPREXA) tablet 5 mg, 5 mg, Oral, Daily, Volanda Napoleon, MD .  ondansetron [facility]) injection 4 mg, 4 mg, Intravenous, Q4H PRN, Theodis Blaze, MD, 4 mg at 11/03/14 0701 .  pantoprazole (PROTONIX) injection 40 mg, 40 mg,  Intravenous, Q12H, Theodis Blaze, MD, 40 mg at 11/02/14 1112 .  polyvinyl alcohol (LIQUIFILM TEARS) 1.4 % ophthalmic solution 2 drop, 2 drop, Both Eyes, TID PRN, Ivor Costa, MD .  simvastatin (ZOCOR) tablet 20 mg, 20 mg, Oral, QHS, Ivor Costa, MD, 20 mg at 11/02/14 2154 .  sodium chloride 0.9 % injection 3 mL, 3 mL, Intravenous, Q12H, Ivor Costa, MD, 3 mL at 11/02/14 1000 .  temazepam (RESTORIL) capsule 15 mg, 15 mg, Oral, QHS PRN, Ivor Costa, MD, 15 mg at 11/02/14 2159:  . sodium chloride   Intravenous Once  . sodium chloride   Intravenous Once  . feeding supplement  1 Container Oral BID BM  . feeding supplement (ENSURE ENLIVE)  237 mL Oral BID BM  . lidocaine  1 patch Transdermal  Q24H  . OLANZapine  5 mg Oral Daily  . pantoprazole (PROTONIX) IV  40 mg Intravenous Q12H  . simvastatin  20 mg Oral QHS  . sodium chloride  3 mL Intravenous Q12H  :  Allergies  Allergen Reactions  . Albuterol-Ipratropium-Soybean Lecithin [Ipratropium-Albuterol] Anxiety  . Cephalexin Other (See Comments)    Nausea and stomach cramps  . Compazine [Prochlorperazine] Anxiety  . Percocet [Oxycodone-Acetaminophen] Itching    Can take with benadryl    . Phenergan [Promethazine Hcl] Other (See Comments)    "restless legs"  . Tramadol Other (See Comments)    "jerking limbs and talking in his sleep"  . Vicodin [Hydrocodone-Acetaminophen] Itching    Can take with benadryl   . Marinol [Dronabinol] Other (See Comments)    "Affects his eyes"  . Reglan [Metoclopramide] Other (See Comments)    Patient has significant shakes that are troubling. Would not like to receive this if there are other options available.  :  Family History  Problem Relation Age of Onset  . Heart attack Brother     60s  . Cancer Father     Lung  . Cancer Mother     Brain  . Cancer Brother     Kidney  :  Social History   Social History  . Marital Status: Married    Spouse Name: N/A  . Number of Children: N/A  . Years of Education:  N/A   Occupational History  . Not on file.   Social History Main Topics  . Smoking status: Former Smoker -- 1.00 packs/day for 60 years    Types: Cigarettes    Start date: 04/05/1964  . Smokeless tobacco: Never Used     Comment: 09/14/2014  . Alcohol Use: No  . Drug Use: No  . Sexual Activity: Not on file   Other Topics Concern  . Not on file   Social History Narrative   Lives in [place] with wife.  Retired Furniture conservator/restorer.  Works out in the yard often - limited by claudication.  Does not routinely exercise.  :  Pertinent items are noted in HPI.  Exam: Patient Vitals for the past 24 hrs:  BP Temp Temp src Pulse Resp SpO2  11/03/14 0703 134/74 mmHg 97.7 F (36.5 C) Oral 68 20 100 %  11/02/14 2156 (!) 98/58 mmHg 97.8 F (36.6 C) Oral (!) 59 18 100 %  11/02/14 1758 (!) 99/55 mmHg 97.9 F (36.6 C) Oral (!) 58 18 99 %  11/02/14 1540 (!) 96/55 mmHg 98 F (36.7 C) Oral (!) 54 18 99 %  11/02/14 1505 (!) 97/57 mmHg 97.9 F (36.6 C) Oral (!) 53 18 99 %  11/02/14 1430 (!) 100/58 mmHg 98 F (36.7 C) Oral (!) 56 18 100 %  11/02/14 1324 (!) 101/54 mmHg 97.9 F (36.6 C) Oral 60 20 100 %  11/02/14 1115 113/62 mmHg 97.7 F (36.5 C) Oral (!) 55 20 100 %  11/02/14 1045 100/64 mmHg 98.2 F (36.8 C) Oral 60 18 100 %    well-developed and well-nourished white gentleman . He is slightly pale. He has no adenopathy in the neck. There is no scleral icterus. He has no oral lesions. He has good breath sounds bilaterally. Cardiac exam regular rate and rhythm with no murmurs, rubs or bruits. Abdomen is soft. He has tolerated decreased bowel sounds. He has the shingles rash in the right T9 dermatome. He has no obvious fluid wave. He has no palpable liver or spleen tip. Extremity  shows no clubbing, cyanosis or edema. Skin exam shows the shingles rash in the right T9 dermatome that seems to be dried up nicely. Neurological exam is nonfocal.   Recent Labs  11/02/14 0443 11/03/14 0520  WBC 0.8* 1.2*  HGB  6.4* 7.1*  HCT 18.9* 20.9*  PLT 13* 32*    Recent Labs  11/02/14 0443 11/03/14 0520  NA 137 139  K 3.6 3.4*  CL 108 107  CO2 22 25  GLUCOSE 106* 82  BUN 21* 15  CREATININE 1.02 1.02  CALCIUM 7.7* 8.0*    Blood smear review:  none   Pathology: None     Assessment and Plan    Mr. Faulkner is a 69 year old white male. He has myelo dysplasia. This has been incredibly difficult to treat and so far, his response has been very minimal.  I cannot explain why he has this nausea. I suppose it might be the Vidaza. It is unusual for Vidaza to cause issues with nausea and vomiting like this. However, it is possible.  I just wonder if somehow the shingles might be involved.  I don't see any problem. The Famvir for right now.  I think it would be reasonable to check his urine make sure is no urinary tract infection.  I appreciate everybody's help.  I will try months some Zyprexa. He has a ton of allergies. I watch her very really has these allergies but his wife feels that any reaction he has would be considered an allergy.  With his white cell count being on the lower side, I will go ahead and give him some Neupogen. I think this would be reasonable.  I appreciate everybody's help. He has been through quite a lot.  We will follow along.  [name] E.  Romans 5:3-5  n:

## 2014-11-03 NOTE — Consult Note (Signed)
   [facility] CM Inpatient Consult   11/03/2014  ARVO EALY 04/30/1945 [id number]   Patient evaluated for [place] Management services. Went to bedside to speak with patient and wife about [facility] Care Management. They pleasantly declined [place] Management services. Left brochure with contact information to call if needed. Made inpatient RNCM aware.  Marthenia Rolling, MSN-Ed, RN,BSN [facility] Liaison [phone]

## 2014-11-03 NOTE — Progress Notes (Signed)
OT Cancellation Note  Patient Details Name: [name] MRN: [id number] DOB: 1945-12-21   Cancelled Treatment:    Reason Eval/Treat Not Completed: Medical issues which prohibited therapy - Pt with complaint of nausea and feeling poorly, requests to defer OT at this time.  Will reattempt.  [name], OTR/L [phone]  11/03/2014, 8:03 AM

## 2014-11-03 NOTE — Consult Note (Signed)
[facility] CONSULT Reason for consult: Nausea and Vomiting Referring Physician: [facility] hospital[facility]. PCP: Dr. Kenton Kingfisher. Oncologists: Dr. Dr Marin Olp. G.I.: Dr. Venetia Constable is an 69 y.o. male.  HPI: He has myelodysplasia and is receiving chemotherapy by Dr. Dr Marin Olp. He apparently is not been responding very well to this. He has had persistent nausea with occasional vomiting that has been going on for some time. According to his wife, he has had this for over a year and it never really has improved. He was in the hospital last year and had a cholecystectomy and they report that this did not help his nausea and vomiting at all. At that time he had EGD subsequent colonoscopy by Dr. Michail Sermon. The EGD was fairly non-remarkable colonoscopy did reveal multiple polyps that were removed or adenomatous. The patient has had severe GERD symptoms and had been taking Protonix but reports that that quit working and he was placed on Komodo Dane which did not work any better. Due to his persistent nausea he's lost 40 pounds. He often will eat severely nauseatingly without vomiting but sometimes will vomit without eating. CT scan on admission did not show any obvious cause of the symptoms. Unfortunately his Thereasa Distance has not really responded to treatment and he was admitted with anemia, neutropenia, and thrombocytopenia with platelet count 17. He did receive some platelets that is gone up now to 33.  Past Medical History  Diagnosis Date  . Coronary atherosclerosis of native coronary artery     a. 10/2008 inf STEMI/PCI: LM 50d (IVUS-borderline lesion->med rx), LAD min irregs, LCX 71m 70d, OM nl, RCA [id number] Vision BMS);  b. 11/2008 Lexiscan MV: EF 65%, no isch/scar.  . Essential hypertension, benign   . Pure hypercholesterolemia   . AAA (abdominal aortic aneurysm) ([facility] [place]    a. 05/2013 CT: 5.8 cm AAA.  . Marland Kitcheniverticulitis     a. 05/2013 CT: descending/sigmoid jxn w/o abscess.  .  Osteoarthritis   . Tobacco abuse     a. ongoing - 1ppd for better part of 50 yrs.  . Normocytic anemia   . Cellulitis 06/10/2013    Right antecubital fossa at site of IV  03/12/13  . PONV (postoperative nausea and vomiting)   . Diverticulitis   . GERD (gastroesophageal reflux disease)   . MDS (myelodysplastic syndrome), high grade ([facility]3/30/2016  . Myocardial infarction ([facility]  . Depression   . Spontaneous pneumothorax 09/14/2014    LEFT LUNG  . Hypotestosteronemia 10/04/2014    Past Surgical History  Procedure Laterality Date  . Cardiac stents  2010  . Cholecystectomy N/A 06/19/2013    Procedure: LAPAROSCOPIC CHOLECYSTECTOMY;  Surgeon: DoHarl BowieMD;  [facility]: General;  Laterality: N/A;  . Abdominal aortic endovascular stent graft N/A 07/02/2013    Procedure: ABDOMINAL AORTIC ENDOVASCULAR STENT GRAFT;  Surgeon: VaSerafina MitchellMD;  [facility];  Service: Vascular;  Laterality: N/A;  . Esophagogastroduodenoscopy N/A 08/05/2013    Procedure: ESOPHAGOGASTRODUODENOSCOPY (EGD);  Surgeon: ViLear NgMD;  [facility];  Service: Endoscopy;  Laterality: N/A;  . Eye surgery Left     cataract surgery  . Back surgery      cervical fusion  . Colonoscopy with propofol N/A 10/15/2013    Procedure: COLONOSCOPY WITH PROPOFOL;  Surgeon: ViLear NgMD;  [facility]: [facility];  Laterality: N/A;  . Fetal blood transfusion  March 16,17,18, 2016  . Bone marrow biopsy  April 01, 2014  . Chest tube insertion  09/14/2014  . Video assisted thoracoscopy Left 10/11/2014    Procedure: VIDEO ASSISTED THORACOSCOPY;  Surgeon: Ivin Poot, MD;  Location: [facility];  Service: Thoracic;  Laterality: Left;  . Stapling of blebs Left 10/11/2014    Procedure: STAPLING OF BLEBS;  Surgeon: Ivin Poot, MD;  Location: [facility] OR;  Service: Thoracic;  Laterality: Left;    Family History  Problem Relation Age of Onset  . Heart attack  Brother     53s  . Cancer Father     Lung  . Cancer Mother     Brain  . Cancer Brother     Kidney    Social History:  reports that he has quit smoking. His smoking use included Cigarettes. He started smoking about 50 years ago. He has a 60 pack-year smoking history. He has never used smokeless tobacco. He reports that he does not drink alcohol or use illicit drugs.  Allergies:  Allergies  Allergen Reactions  . Albuterol-Ipratropium-Soybean Lecithin [Ipratropium-Albuterol] Anxiety  . Cephalexin Other (See Comments)    Nausea and stomach cramps  . Compazine [Prochlorperazine] Anxiety  . Percocet [Oxycodone-Acetaminophen] Itching    Can take with benadryl    . Phenergan [Promethazine Hcl] Other (See Comments)    "restless legs"  . Tramadol Other (See Comments)    "jerking limbs and talking in his sleep"  . Vicodin [Hydrocodone-Acetaminophen] Itching    Can take with benadryl   . Marinol [Dronabinol] Other (See Comments)    "Affects his eyes"  . Reglan [Metoclopramide] Other (See Comments)    Patient has significant shakes that are troubling. Would not like to receive this if there are other options available.    Medications; Prior to Admission medications   Medication Sig Start Date End Date Taking? Authorizing Provider  acetaminophen (TYLENOL) 500 MG tablet Take 500 mg by mouth every 6 (six) hours as needed for fever.   Yes Historical Provider, MD  ALPRAZolam (XANAX) 0.25 MG tablet Take 1 tablet (0.25 mg total) by mouth 3 (three) times daily as needed for anxiety. 10/03/14  Yes Volanda Napoleon, MD  aspirin EC 81 MG tablet Take 1 tablet (81 mg total) by mouth daily. 03/30/14  Yes Sherren Mocha, MD  carboxymethylcellulose (REFRESH TEARS) 0.5 % SOLN Place 2 drops into both eyes 3 (three) times daily as needed (For dry eyes.).   Yes Historical Provider, MD  diphenhydrAMINE (BENADRYL) 25 mg capsule Take 1 capsule (25 mg total) by mouth every 6 (six) hours as needed for itching. May  take with Percocet PRN 10/17/14  Yes Donielle Liston Alba, PA-C  famciclovir (FAMVIR) 500 MG tablet Take 1 tablet (500 mg total) by mouth daily. 10/27/14  Yes Volanda Napoleon, MD  famotidine (PEPCID AC) 10 MG chewable tablet Chew 10 mg by mouth 2 (two) times daily.   Yes Historical Provider, MD  feeding supplement, ENSURE ENLIVE, (ENSURE ENLIVE) LIQD Take 237 mLs by mouth 2 (two) times daily between meals. 10/17/14  Yes Donielle Liston Alba, PA-C  HYDROcodone-acetaminophen (NORCO/VICODIN) 5-325 MG tablet Take 1 tablet by mouth every 6 (six) hours as needed for moderate pain. 11/01/14  Yes Eliezer Bottom, NP  lidocaine-prilocaine (EMLA) cream Apply 1 application topically as needed. Please dispense 2 tubes 10/12/14  Yes Volanda Napoleon, MD  LORazepam (ATIVAN) 0.5 MG tablet Take 1 tablet (0.5 mg total) by mouth every 6 (six) hours as needed (Nausea or vomiting). 04/11/14  Yes Collier Salina  Oletha Cruel, MD  metoprolol tartrate (LOPRESSOR) 25 MG tablet Take 0.5 tablets (12.5 mg total) by mouth 2 (two) times daily. 08/03/13  Yes Sherren Mocha, MD  nitroGLYCERIN (NITROSTAT) 0.4 MG SL tablet Place 1 tablet (0.4 mg total) under the tongue every 5 (five) minutes as needed for chest pain. 03/29/14  Yes Sherren Mocha, MD  ondansetron (ZOFRAN ODT) 4 MG disintegrating tablet Take 1 tablet (4 mg total) by mouth every 8 (eight) hours as needed for nausea or vomiting. 11/01/14  Yes Volanda Napoleon, MD  Potassium Chloride 40 MEQ/15ML (20%) SOLN Take 40 mEq by mouth 2 (two) times daily. Patient taking differently: Take 40 mEq by mouth every other day.  10/19/14  Yes Volanda Napoleon, MD  simvastatin (ZOCOR) 20 MG tablet Take 1 tablet (20 mg total) by mouth at bedtime. 03/30/14  Yes Sherren Mocha, MD  temazepam (RESTORIL) 15 MG capsule Take 15 mg by mouth at bedtime as needed for sleep.   Yes Historical Provider, MD  testosterone cypionate (DEPOTESTOTERONE CYPIONATE) 100 MG/ML injection Inject 400 mg into the muscle every 28  (twenty-eight) days. For IM use only   Yes Historical Provider, MD  lidocaine (LIDODERM) 5 % Place 1 patch onto the skin daily. Remove & Discard patch within 12 hours or as directed by MD 10/31/14   Volanda Napoleon, MD  potassium chloride SA (K-DUR,KLOR-CON) 20 MEQ tablet Take 2 tablets (40 mEq total) by mouth 2 (two) times daily. Patient not taking: Reported on 11/01/2014 10/20/14   Volanda Napoleon, MD  thiamine 100 MG tablet Take 1 tablet (100 mg total) by mouth daily. Patient not taking: Reported on 11/01/2014 10/17/14   Nani Skillern, PA-C   . sodium chloride   Intravenous Once  . sodium chloride   Intravenous Once  . feeding supplement  1 Container Oral BID BM  . feeding supplement (ENSURE ENLIVE)  237 mL Oral BID BM  . filgrastim  480 mcg Subcutaneous Daily  . lidocaine  1 patch Transdermal Q24H  . OLANZapine  5 mg Oral Daily  . pantoprazole (PROTONIX) IV  40 mg Intravenous Q12H  . simvastatin  20 mg Oral QHS  . sodium chloride  3 mL Intravenous Q12H   PRN Meds acetaminophen, ALPRAZolam, diphenhydrAMINE, HYDROmorphone (DILAUDID) injection, lidocaine-prilocaine, nitroGLYCERIN, ondansetron (ZOFRAN) IV, polyvinyl alcohol, temazepam Results for orders placed or performed during the hospital encounter of 11/01/14 (from the past 48 hour(s))  CBC with Differential/Platelet     Status: Abnormal   Collection Time: 11/01/14 11:51 PM  Result Value Ref Range   WBC 0.9 (LL) 4.0 - 10.5 K/uL    Comment: REPEATED TO VERIFY WHITE COUNT CONFIRMED ON SMEAR CRITICAL RESULT CALLED TO, READ BACK BY AND VERIFIED WITH: T DOSTER RN 0030 11/02/14 A NAVARRO    RBC 2.61 (L) 4.22 - 5.81 MIL/uL   Hemoglobin 7.3 (L) 13.0 - 17.0 g/dL   HCT 21.6 (L) 39.0 - 52.0 %   MCV 82.8 78.0 - 100.0 fL   MCH 28.0 26.0 - 34.0 pg   MCHC 33.8 30.0 - 36.0 g/dL   RDW 13.9 11.5 - 15.5 %   Platelets 17 (LL) 150 - 400 K/uL    Comment: REPEATED TO VERIFY SPECIMEN CHECKED FOR CLOTS PLATELET COUNT CONFIRMED BY  SMEAR CRITICAL RESULT CALLED TO, READ BACK BY AND VERIFIED WITH: T DOSTER RN 0030 11/02/14 A NAVARRO    Neutrophils Relative % 80 %   Neutro Abs 0.7 (L) 1.7 - 7.7 K/uL  Lymphocytes Relative 16 %   Lymphs Abs 0.2 (L) 0.7 - 4.0 K/uL   Monocytes Relative 3 %   Monocytes Absolute 0.0 (L) 0.1 - 1.0 K/uL   Eosinophils Relative 0 %   Eosinophils Absolute 0.0 0.0 - 0.7 K/uL   Basophils Relative 0 %   Basophils Absolute 0.0 0.0 - 0.1 K/uL  Lipase, blood     Status: None   Collection Time: 11/01/14 11:51 PM  Result Value Ref Range   Lipase 29 22 - 51 U/L  Hepatic function panel     Status: Abnormal   Collection Time: 11/01/14 11:51 PM  Result Value Ref Range   Total Protein 7.8 6.5 - 8.1 g/dL   Albumin 3.1 (L) 3.5 - 5.0 g/dL   AST 22 15 - 41 U/L   ALT 15 (L) 17 - 63 U/L   Alkaline Phosphatase 78 38 - 126 U/L   Total Bilirubin 0.6 0.3 - 1.2 mg/dL   Bilirubin, Direct 0.2 0.1 - 0.5 mg/dL   Indirect Bilirubin 0.4 0.3 - 0.9 mg/dL  I-stat chem 8, ed     Status: Abnormal   Collection Time: 11/02/14 12:02 AM  Result Value Ref Range   Sodium 139 135 - 145 mmol/L   Potassium 4.2 3.5 - 5.1 mmol/L   Chloride 105 101 - 111 mmol/L   BUN 27 (H) 6 - 20 mg/dL   Creatinine, Ser 1.20 0.61 - 1.24 mg/dL   Glucose, Bld 149 (H) 65 - 99 mg/dL   Calcium, Ion 1.10 (L) 1.13 - 1.30 mmol/L   TCO2 21 0 - 100 mmol/L   Hemoglobin 7.5 (L) 13.0 - 17.0 g/dL   HCT 22.0 (L) 39.0 - 52.0 %  I-Stat CG4 Lactic Acid, ED     Status: None   Collection Time: 11/02/14 12:03 AM  Result Value Ref Range   Lactic Acid, Venous 1.42 0.5 - 2.0 mmol/L  I-stat troponin, ED     Status: None   Collection Time: 11/02/14  1:29 AM  Result Value Ref Range   Troponin i, poc 0.01 0.00 - 0.08 ng/mL   Comment 3            Comment: Due to the release kinetics of cTnI, a negative result within the first hours of the onset of symptoms does not rule out myocardial infarction with certainty. If myocardial infarction is still  suspected, repeat the test at appropriate intervals.   Urinalysis, Routine w reflex microscopic (not at [facility])     Status: None   Collection Time: 11/02/14  2:02 AM  Result Value Ref Range   Color, Urine YELLOW YELLOW   APPearance CLEAR CLEAR   Specific Gravity, Urine 1.017 1.005 - 1.030   pH 6.0 5.0 - 8.0   Glucose, UA NEGATIVE NEGATIVE mg/dL   Hgb urine dipstick NEGATIVE NEGATIVE   Bilirubin Urine NEGATIVE NEGATIVE   Ketones, ur NEGATIVE NEGATIVE mg/dL   Protein, ur NEGATIVE NEGATIVE mg/dL   Urobilinogen, UA 1.0 0.0 - 1.0 mg/dL   Nitrite NEGATIVE NEGATIVE   Leukocytes, UA NEGATIVE NEGATIVE    Comment: MICROSCOPIC NOT DONE ON URINES WITH NEGATIVE PROTEIN, BLOOD, LEUKOCYTES, NITRITE, OR GLUCOSE <1000 mg/dL.  I-Stat CG4 Lactic Acid, ED     Status: Abnormal   Collection Time: 11/02/14  3:08 AM  Result Value Ref Range   Lactic Acid, Venous 0.40 (L) 0.5 - 2.0 mmol/L  Protime-INR     Status: Abnormal   Collection Time: 11/02/14  4:43 AM  Result Value  Ref Range   Prothrombin Time 17.2 (H) 11.6 - 15.2 seconds   INR 1.39 0.00 - 1.49  APTT     Status: None   Collection Time: 11/02/14  4:43 AM  Result Value Ref Range   aPTT 36 24 - 37 seconds  Comprehensive metabolic panel     Status: Abnormal   Collection Time: 11/02/14  4:43 AM  Result Value Ref Range   Sodium 137 135 - 145 mmol/L   Potassium 3.6 3.5 - 5.1 mmol/L   Chloride 108 101 - 111 mmol/L   CO2 22 22 - 32 mmol/L   Glucose, Bld 106 (H) 65 - 99 mg/dL   BUN 21 (H) 6 - 20 mg/dL   Creatinine, Ser 1.02 0.61 - 1.24 mg/dL   Calcium 7.7 (L) 8.9 - 10.3 mg/dL   Total Protein 6.6 6.5 - 8.1 g/dL   Albumin 2.7 (L) 3.5 - 5.0 g/dL   AST 15 15 - 41 U/L   ALT 14 (L) 17 - 63 U/L   Alkaline Phosphatase 69 38 - 126 U/L   Total Bilirubin 0.8 0.3 - 1.2 mg/dL   GFR calc non Af Amer >60 >60 mL/min   GFR calc Af Amer >60 >60 mL/min    Comment: (NOTE) The eGFR has been calculated using the CKD EPI equation. This calculation has not been  validated in all clinical situations. eGFR's persistently <60 mL/min signify possible Chronic Kidney Disease.    Anion gap 7 5 - 15  CBC     Status: Abnormal   Collection Time: 11/02/14  4:43 AM  Result Value Ref Range   WBC 0.8 (LL) 4.0 - 10.5 K/uL    Comment: REPEATED TO VERIFY CRITICAL VALUE NOTED.  VALUE IS CONSISTENT WITH PREVIOUSLY REPORTED AND CALLED VALUE.    RBC 2.24 (L) 4.22 - 5.81 MIL/uL   Hemoglobin 6.4 (LL) 13.0 - 17.0 g/dL    Comment: REPEATED TO VERIFY CRITICAL RESULT CALLED TO, READ BACK BY AND VERIFIED WITH: [facility] RN 0515 11/02/14 A NAVARRO    HCT 18.9 (L) 39.0 - 52.0 %   MCV 84.4 78.0 - 100.0 fL   MCH 28.6 26.0 - 34.0 pg   MCHC 33.9 30.0 - 36.0 g/dL   RDW 14.2 11.5 - 15.5 %   Platelets 13 (LL) 150 - 400 K/uL    Comment: REPEATED TO VERIFY SPECIMEN CHECKED FOR CLOTS CRITICAL VALUE NOTED.  VALUE IS CONSISTENT WITH PREVIOUSLY REPORTED AND CALLED VALUE. CONSISTENT WITH PREVIOUS RESULT   Prepare RBC     Status: None   Collection Time: 11/02/14  6:30 AM  Result Value Ref Range   Order Confirmation ORDER PROCESSED BY BLOOD BANK   Glucose, capillary     Status: None   Collection Time: 11/02/14  7:32 AM  Result Value Ref Range   Glucose-Capillary 92 65 - 99 mg/dL  Type and screen Cucumber     Status: None   Collection Time: 11/02/14  9:00 AM  Result Value Ref Range   ABO/RH(D) A POS    Antibody Screen NEG    Sample Expiration 11/05/2014    Unit Number [id number]    Blood Component Type RBC, LR IRR    Unit division 00    Status of Unit ISSUED,FINAL    Transfusion Status OK TO TRANSFUSE    Crossmatch Result Compatible   Prepare Pheresed Platelets     Status: None   Collection Time: 11/02/14  1:00 PM  Result Value Ref  Range   Unit Number [id number]    Blood Component Type PLTPHER LI2    Unit division 00    Status of Unit ISSUED,FINAL    Transfusion Status OK TO TRANSFUSE   Basic metabolic panel     Status: Abnormal    Collection Time: 11/03/14  5:20 AM  Result Value Ref Range   Sodium 139 135 - 145 mmol/L   Potassium 3.4 (L) 3.5 - 5.1 mmol/L   Chloride 107 101 - 111 mmol/L   CO2 25 22 - 32 mmol/L   Glucose, Bld 82 65 - 99 mg/dL   BUN 15 6 - 20 mg/dL   Creatinine, Ser 1.02 0.61 - 1.24 mg/dL   Calcium 8.0 (L) 8.9 - 10.3 mg/dL   GFR calc non Af Amer >60 >60 mL/min   GFR calc Af Amer >60 >60 mL/min    Comment: (NOTE) The eGFR has been calculated using the CKD EPI equation. This calculation has not been validated in all clinical situations. eGFR's persistently <60 mL/min signify possible Chronic Kidney Disease.    Anion gap 7 5 - 15  CBC with Differential/Platelet     Status: Abnormal   Collection Time: 11/03/14  5:20 AM  Result Value Ref Range   WBC 1.2 (LL) 4.0 - 10.5 K/uL    Comment: CRITICAL VALUE NOTED.  VALUE IS CONSISTENT WITH PREVIOUSLY REPORTED AND CALLED VALUE.   RBC 2.46 (L) 4.22 - 5.81 MIL/uL   Hemoglobin 7.1 (L) 13.0 - 17.0 g/dL   HCT 20.9 (L) 39.0 - 52.0 %   MCV 85.0 78.0 - 100.0 fL   MCH 28.9 26.0 - 34.0 pg   MCHC 34.0 30.0 - 36.0 g/dL   RDW 14.3 11.5 - 15.5 %   Platelets 32 (L) 150 - 400 K/uL    Comment: PLATELET COUNT CONFIRMED BY SMEAR   Neutrophils Relative % 47 %   Lymphocytes Relative 48 %   Monocytes Relative 5 %   Eosinophils Relative 0 %   Basophils Relative 0 %   Neutro Abs 0.5 (L) 1.7 - 7.7 K/uL   Lymphs Abs 0.6 (L) 0.7 - 4.0 K/uL   Monocytes Absolute 0.1 0.1 - 1.0 K/uL   Eosinophils Absolute 0.0 0.0 - 0.7 K/uL   Basophils Absolute 0.0 0.0 - 0.1 K/uL   WBC Morphology WHITE COUNT CONFIRMED ON SMEAR   Glucose, capillary     Status: None   Collection Time: 11/03/14  7:54 AM  Result Value Ref Range   Glucose-Capillary 93 65 - 99 mg/dL    Ct Head Wo Contrast  11/02/2014  CLINICAL DATA:  Altered mental status. Episodes of verbal unresponsiveness. Severe nausea. EXAM: CT HEAD WITHOUT CONTRAST TECHNIQUE: Contiguous axial images were obtained from the base of the  skull through the vertex without intravenous contrast. COMPARISON:  None. FINDINGS: Mild generalized cerebral and cerebellar atrophy.No intracranial hemorrhage, mass effect, or midline shift. No hydrocephalus. The basilar cisterns are patent. No evidence of territorial infarct. No intracranial fluid collection. Calvarium is intact. Included paranasal sinuses and mastoid air cells are well aerated. IMPRESSION: Mild atrophy.  No acute intracranial abnormality. Electronically Signed   By: Jeb Levering M.D.   On: 11/02/2014 00:50   Ct Abdomen Pelvis W Contrast  11/02/2014  CLINICAL DATA:  Acute onset of nausea and vomiting, status post recent chemotherapy. Known bone metastases. Generalized abdominal pain and neutropenia. Initial encounter. EXAM: CT ABDOMEN AND PELVIS WITH CONTRAST TECHNIQUE: Multidetector CT imaging of the abdomen and pelvis was performed using  the standard protocol following bolus administration of intravenous contrast. CONTRAST:  162mL OMNIPAQUE IOHEXOL 300 MG/ML  SOLN COMPARISON:  CT of the chest, abdomen and pelvis performed 03/30/2014 FINDINGS: Minimal left basilar scarring is noted. Scattered coronary artery calcifications are seen. The liver is unremarkable in appearance. The spleen is enlarged, measuring 15.7 cm in length. The patient is status post cholecystectomy, with clips noted at the gallbladder fossa. The pancreas and adrenal glands are unremarkable. Small bilateral renal cysts are seen, measuring up to 2.3 cm in size. The kidneys are otherwise unremarkable in appearance. There is no evidence of hydronephrosis. No renal or ureteral stones are seen. No perinephric stranding is appreciated. No free fluid is identified. The small bowel is unremarkable in appearance. The stomach is within normal limits. No acute vascular abnormalities are seen. An IVC filter is noted. The patient's aortoiliac stent graft is grossly unremarkable in appearance. The appendix is normal in caliber,  without evidence of appendicitis. Scattered diverticulosis is noted along the descending and sigmoid colon, without evidence of diverticulitis. The bladder is mildly distended and grossly unremarkable. The prostate is enlarged, measuring 5.3 cm in transverse dimension. No inguinal lymphadenopathy is seen. No acute osseous abnormalities are identified. Vacuum phenomenon is noted at L5-S1, with endplate sclerotic change. IMPRESSION: 1. No acute abnormality seen to explain the patient's symptoms. 2. Scattered diverticulosis along the descending and sigmoid colon, without evidence of diverticulitis. 3. Aortoiliac stent graft is grossly unremarkable in appearance. 4. Small bilateral renal cysts seen. 5. Scattered coronary artery calcifications noted. 6. Splenomegaly noted. 7. Enlarged prostate seen. Electronically Signed   By: Garald Balding M.D.   On: 11/02/2014 02:54   Dg Abd Acute W/chest  11/02/2014  CLINICAL DATA:  Acute onset of severe nausea. Patient on radiation therapy and chemotherapy for bone cancer. Initial encounter. EXAM: DG ABDOMEN ACUTE W/ 1V CHEST COMPARISON:  Chest radiograph performed 10/21/2014, and abdominal radiograph performed 10/12/2014 FINDINGS: The lungs are well-aerated. Mild left-sided airspace opacity could reflect a loculated effusion or mild infection. There is no evidence of pneumothorax. The cardiomediastinal silhouette is borderline enlarged. A right-sided chest port is noted ending about the distal SVC. The visualized bowel gas pattern is unremarkable. Scattered stool and air are seen within the colon; there is no evidence of small bowel dilatation to suggest obstruction. No free intra-abdominal air is identified on the provided decubitus view. No acute osseous abnormalities are seen; the sacroiliac joints are unremarkable in appearance. An IVC filter is noted. An aortoiliac stent graft is seen. Clips are noted within the right upper quadrant, reflecting prior cholecystectomy.  IMPRESSION: 1. Unremarkable bowel gas pattern; no free intra-abdominal air seen. 2. Mild left-sided airspace opacity could reflect a loculated pleural effusion or mild infection, slightly less prominent than on the recent chest radiograph. 3. Borderline cardiomegaly. Electronically Signed   By: Garald Balding M.D.   On: 11/02/2014 01:02               Blood pressure 134/74, pulse 68, temperature 97.7 F (36.5 C), temperature source Oral, resp. rate 20, height $RemoveBe'5\' 10"'[id number]$  (1.778 m), weight 76.3 kg (168 lb 3.4 oz), SpO2 100 %.  Physical exam:   General-- pleasant white male in the acute distress ENT-- nonicteric Neck-- no lymphadenopathy Heart-- regular rate and rhythm without murmurs or gallops Lungs-- clear Abdomen-- soft and nontender with normal bowel sounds Psych-- alert and oriented times 3   Assessment: 1. Nausea and Vomiting. This is been going on for some time and he  has been evaluated last year and even had cholecystectomy none of this is really helped his symptoms. With severe thrombocytopenia neutropenia I'm very hasn't to perform EGD and less absolutely needed. 2. Myelodysplasia. This is been unresponsive to therapy and he is severely neutropenia , and thrombocytopenia. 3. Multiple other problems as noted above.  Plan: agree with aggressive acid producing therapy with IV Protonix will obtain upper G.I. series at this point in time rather than EGD due to his bone marrow suppression.   Allyah Heather JR,Tyronica Truxillo L 11/03/2014, 9:30 AM   Pager: [phone] If no answer or after hours call [phone]

## 2014-11-03 NOTE — Progress Notes (Signed)
PT Cancellation Note  Patient Details Name: [name] MRN: [id number] DOB: 1945/06/30   Cancelled Treatment:    Reason Eval/Treat Not Completed: Fatigue/lethargy limiting ability to participate Pt reports feeling too fatigued to mobilize today.  Would like PT to check back tomorrow.   Caprisha Bridgett,KATHrine E 11/03/2014, 1:49 PM Carmelia Bake, PT, DPT 11/03/2014 Pager: [phone]

## 2014-11-04 ENCOUNTER — Ambulatory Visit: Payer: Medicare Other

## 2014-11-04 ENCOUNTER — Encounter (HOSPITAL_COMMUNITY): Payer: Self-pay | Admitting: *Deleted

## 2014-11-04 ENCOUNTER — Encounter (HOSPITAL_COMMUNITY)
Admission: EM | Disposition: A | Discharge status: Home Health | Payer: Self-pay | Source: Home / Self Care | Attending: Internal Medicine

## 2014-11-04 HISTORY — PX: ESOPHAGOGASTRODUODENOSCOPY: SHX5428

## 2014-11-04 LAB — CBC
HEMATOCRIT: 23.1 % — AB (ref 39.0–52.0)
Hemoglobin: 7.5 g/dL — ABNORMAL LOW (ref 13.0–17.0)
MCH: 27.4 pg (ref 26.0–34.0)
MCHC: 32.5 g/dL (ref 30.0–36.0)
MCV: 84.3 fL (ref 78.0–100.0)
PLATELETS: 35 10*3/uL — AB (ref 150–400)
RBC: 2.74 MIL/uL — ABNORMAL LOW (ref 4.22–5.81)
RDW: 13.8 % (ref 11.5–15.5)
WBC: 4 10*3/uL (ref 4.0–10.5)

## 2014-11-04 LAB — BASIC METABOLIC PANEL
ANION GAP: 9 (ref 5–15)
BUN: 13 mg/dL (ref 6–20)
CO2: 26 mmol/L (ref 22–32)
Calcium: 8.3 mg/dL — ABNORMAL LOW (ref 8.9–10.3)
Chloride: 106 mmol/L (ref 101–111)
Creatinine, Ser: 0.97 mg/dL (ref 0.61–1.24)
GFR calc Af Amer: >60 mL/min (ref >60)
GLUCOSE: 124 mg/dL — AB (ref 65–99)
POTASSIUM: 3.6 mmol/L (ref 3.5–5.1)
Sodium: 141 mmol/L (ref 135–145)

## 2014-11-04 LAB — GLUCOSE, CAPILLARY: Glucose-Capillary: 107 mg/dL — ABNORMAL HIGH (ref 65–99)

## 2014-11-04 SURGERY — EGD (ESOPHAGOGASTRODUODENOSCOPY)
Anesthesia: Moderate Sedation

## 2014-11-04 MED ORDER — SODIUM CHLORIDE 0.9 % IV SOLN
Freq: Once | INTRAVENOUS | Status: AC
  Administered 2014-11-04: 14:00:00 via INTRAVENOUS

## 2014-11-04 MED ORDER — LORAZEPAM 2 MG/ML IJ SOLN
0.5000 mg | Freq: Once | INTRAMUSCULAR | Status: AC
  Administered 2014-11-04: 0.5 mg via INTRAVENOUS
  Filled 2014-11-04: qty 1

## 2014-11-04 MED ORDER — PANTOPRAZOLE SODIUM 40 MG IV SOLR
40.0000 mg | Freq: Once | INTRAVENOUS | Status: AC
  Administered 2014-11-04: 40 mg via INTRAVENOUS
  Filled 2014-11-04: qty 40

## 2014-11-04 MED ORDER — FENTANYL CITRATE (PF) 100 MCG/2ML IJ SOLN
INTRAMUSCULAR | Status: AC
  Filled 2014-11-04: qty 2

## 2014-11-04 MED ORDER — BUTAMBEN-TETRACAINE-BENZOCAINE 2-2-14 % EX AERO
INHALATION_SPRAY | CUTANEOUS | Status: DC | PRN
  Administered 2014-11-04: 2 via TOPICAL

## 2014-11-04 MED ORDER — PHENOL 1.4 % MT LIQD
1.0000 | OROMUCOSAL | Status: DC | PRN
  Filled 2014-11-04: qty 177

## 2014-11-04 MED ORDER — DIPHENHYDRAMINE HCL 50 MG/ML IJ SOLN
INTRAMUSCULAR | Status: DC | PRN
  Administered 2014-11-04: 25 mg via INTRAVENOUS

## 2014-11-04 MED ORDER — ONDANSETRON HCL 4 MG/2ML IJ SOLN
INTRAMUSCULAR | Status: AC
  Filled 2014-11-04: qty 2

## 2014-11-04 MED ORDER — MIDAZOLAM HCL 10 MG/2ML IJ SOLN
INTRAMUSCULAR | Status: DC | PRN
  Administered 2014-11-04 (×3): 2 mg via INTRAVENOUS

## 2014-11-04 MED ORDER — GI COCKTAIL ~~LOC~~
30.0000 mL | Freq: Once | ORAL | Status: DC
  Filled 2014-11-04: qty 30

## 2014-11-04 MED ORDER — MIDAZOLAM HCL 5 MG/ML IJ SOLN
INTRAMUSCULAR | Status: AC
  Filled 2014-11-04: qty 2

## 2014-11-04 MED ORDER — SUCRALFATE 1 GM/10ML PO SUSP
1.0000 g | Freq: Three times a day (TID) | ORAL | Status: DC
  Administered 2014-11-04 – 2014-11-08 (×15): 1 g via ORAL
  Filled 2014-11-04 (×16): qty 10

## 2014-11-04 MED ORDER — FENTANYL CITRATE (PF) 100 MCG/2ML IJ SOLN
INTRAMUSCULAR | Status: DC | PRN
  Administered 2014-11-04 (×2): 25 ug via INTRAVENOUS

## 2014-11-04 MED ORDER — DIPHENHYDRAMINE HCL 50 MG/ML IJ SOLN
INTRAMUSCULAR | Status: AC
  Filled 2014-11-04: qty 1

## 2014-11-04 NOTE — Progress Notes (Addendum)
[facility] PROGRESS NOTE Subjective Rough night. Dry heaves w/o much vomitus. No abd pain  Objective: Vital signs in last 24 hours: Temp:  [97.4 F (36.3 C)-98.3 F (36.8 C)] 97.4 F (36.3 C) (10/21 0657) Pulse Rate:  [60-112] 112 (10/21 0657) Resp:  [18-22] 22 (10/21 0657) BP: (113-153)/(63-86) 153/86 mmHg (10/21 0657) SpO2:  [98 %-100 %] 99 % (10/21 0657) Last BM Date: 11/01/14  Intake/Output from previous day: 10/20 0701 - 10/21 0700 In: 200 [I.V.:200] Out: 2975 [Urine:2975] Intake/Output this shift:    PE: General--vomiting clear material   Lab Results:  Recent Labs  11/01/14 2351 11/02/14 0002 11/02/14 0443 11/03/14 0520 11/04/14 0510  WBC 0.9*  --  0.8* 1.2* 4.0  HGB 7.3* 7.5* 6.4* 7.1* 7.5*  HCT 21.6* 22.0* 18.9* 20.9* 23.1*  PLT 17*  --  13* 32* 35*   BMET  Recent Labs  11/02/14 0002 11/02/14 0443 11/03/14 0520 11/04/14 0510  NA 139 137 139 141  K 4.2 3.6 3.4* 3.6  CL 105 108 107 106  CO2  --  22 25 26   CREATININE 1.20 1.02 1.02 0.97   LFT  Recent Labs  11/01/14 2351 11/02/14 0443  PROT 7.8 6.6  AST 22 15  ALT 15* 14*  ALKPHOS 78 69  BILITOT 0.6 0.8  BILIDIR 0.2  --   IBILI 0.4  --    PT/INR  Recent Labs  11/02/14 0443  LABPROT 17.2*  INR 1.39   PANCREAS  Recent Labs  11/01/14 2351  LIPASE 29         Studies/Results: Ct Soft Tissue Neck W Contrast  11/03/2014  CLINICAL DATA:  Bilateral neck swelling. Myeloma. Ongoing chemotherapy. EXAM: CT NECK WITH CONTRAST TECHNIQUE: Multidetector CT imaging of the neck was performed using the standard protocol following the bolus administration of intravenous contrast. CONTRAST:  67mL OMNIPAQUE IOHEXOL 300 MG/ML  SOLN COMPARISON:  None. FINDINGS: Pharynx and larynx: No focal mucosal or submucosal lesions are present. Vocal cords are midline and symmetric. The tongue base is within normal limits. Salivary glands: There is a heterogeneous appearance of the submandibular  glands bilaterally. Mild intraglandular ductal dilation is present. There no definite stones. The parotid glands are within normal limits bilaterally. There is thickening of the mylohyoid muscle and subcutaneous tissues. No discrete abscess is present. Thyroid: Negative Lymph nodes: No significant adenopathy is present in the neck. A right periaortic node measures 6.5 mm. Sub cm nodes are evident at the level of the aortic arch. Vascular: Atherosclerotic calcifications are present at the aortic arch in bilateral carotid bifurcations without significant stenosis. Limited intracranial: Within normal limits Visualized orbits: Negative Mastoids and visualized paranasal sinuses: The paranasal sinuses and mastoid air cells are clear. Skeleton: Chronic endplate degenerative change in uncovertebral spurring is most evident at C6-7 with moderate foraminal narrowing on the left. A laminectomy defect is present on the right. Upper chest: A right IJ Port-A-Cath is in place. Apical blebs are noted bilaterally. A high-density pleural collection is present on the left. There are suture materials and calcification within this collection. There is some rim enhancement. This is likely related to talc pleurodesis. The area is incompletely imaged. IMPRESSION: 1. Inflammatory changes and heterogeneous enhancement of the submandibular glands bilaterally. There is mild intra ductal dilation without evidence for ductal obstruction. This is compatible with bilateral sialoadenitis. 2. Secondary inflammatory changes within subcutaneous soft tissues and mylohyoid muscle adjacent to the submandibular glands. 3. No significant cervical adenopathy. 4. Sub cm of mediastinal nodes.  5. High density peripherally enhancing pleural collection on the left is incompletely imaged. This is likely related to the recent talc pleurodesis. 6. Right-sided Port-A-Cath. Electronically Signed   By: San Morelle M.D.   On: 11/03/2014 12:32     Medications: I have reviewed the patient's current medications.  Assessment/Plan: 1. N+V. Doubt due to ulcer or H pylori. He is nauseated and vomits/retches whether he eats or not and is on aggressive acid suppression. I suspect due to chemotherapy. UGI today. Fortunately, her PLTs and WBC seem to be coming up and if anything seen on UGI that would require biopsy, we could probably do it. Would treat nausea symptomatically for now.   Lula Kolton JR,Marilyn Nihiser L 11/04/2014, 7:32 AM  Pager: [phone] If no answer or after hours call [phone]  Discussed with Dr. Dr Marin Olp. Since patients WBC and platelet count is much better today he would like to go ahead and get an EGD I think this is reasonable. We will try to get that added on later today. Discussed with patient's wife.

## 2014-11-04 NOTE — Op Note (Signed)
[facility] [place], [address]   ENDOSCOPY PROCEDURE REPORT  PATIENT: [name], [name]  MR#: [id number] BIRTHDATE: [birth year] , 29  yrs. old GENDER: male ENDOSCOPIST:Kristofer Schaffert, MD REFERRED BY:  Dr. Dr Marin Olp PROCEDURE DATE:  11/04/2014 PROCEDURE:    EGD and biopsy ASA CLASS:     class IV INDICATIONS:  severe nausea and vomiting gentlemen on chemotherapy for severe myelodysplasia MEDICATION:  Benadryl 25 mg, fentanyl 50 g , 1st set 6 mg IV  TOPICAL ANESTHETIC:   Cetacaine Spray  DESCRIPTION OF PROCEDURE:   After the risks and benefits of the procedure were explained, informed consent was obtained.  The Pentax Gastroscope [id number]  endoscope was introduced through the mouth  and advanced to the second portion of the duodenum .  was then advanced to the 3rd portion of the duodenum to the full extent of the scope. The instrument was slowly withdrawn as the mucosa was fully examined. Estimated blood loss is zero unless otherwise noted in this procedure report.  the mucosa of the duodenum was normal other than some submucosal hemorrhages from the scope due to his low platelets. no gross lesions were seen in the 3rd, 2nd duodenum or the duodenal bulb. the antrum is grossly normal in a biopsy was taken for H. pylori. they were hemorrhages from scope pressure. retro flex view was grossly normal. as the scope was withdrawn, there was ulceration just above the GE junction in the esophagus. 2 or 3 biopsies at the edges were taken. the proximal esophagus was normal.    The scope was then withdrawn from the patient and the procedure completed.  COMPLICATIONS: There were no immediate complications.  ENDOSCOPIC IMPRESSION: 1.  nausea and vomiting. No clear explanation on EGD 2.  Esophageal ulceration. This could be secondary to vomiting or primary. Biopsies were obtained RECOMMENDATIONS: [Reco.mmendations] would continue IV proton pump inhibitor,  consider IV Ativan or other sedative. Check biopsy results.  _______________________________ Lorrin MaisLaurence Spates, MD 11/04/2014 12:32 PM     cc:  Dr. Dr Marin Olp  CPT CODES: ICD CODES:  The ICD and CPT codes recommended by this software are interpretations from the data that the clinical staff has captured with the software.  The verification of the translation of this report to the ICD and CPT codes and modifiers is the sole responsibility of the health care institution and practicing physician where this report was generated.  [employer]. will not be held responsible for the validity of the ICD and CPT codes included on this report.  AMA assumes no liability for data contained or not contained herein. CPT is a Designer, television/film set of the [employer].  PATIENT NAME:  [name], [name] MR#: [id number]

## 2014-11-04 NOTE — Care Management Important Message (Signed)
Important Message  Patient Details  Name: KADYN GUILD MRN: [id number] Date of Birth: 1946-01-06   Medicare Important Message Given:  Yes-second notification given    Camillo Flaming 11/04/2014, 10:05 AMImportant Message  Patient Details  Name: JAVAUGHN OPDAHL MRN: [id number] Date of Birth: [birth year]   Medicare Important Message Given:  Yes-second notification given    Camillo Flaming 11/04/2014, 10:05 AM

## 2014-11-04 NOTE — Progress Notes (Signed)
Patient ID: [name], male   DOB: 02-02-1945, 69 y.o.   MRN: [id number]  [facility] PROGRESS NOTE  [name] WUJ:[id number] DOB: 11-Aug-1945 DOA: 11/01/2014 PCP: Shirline Frees, MD   Brief narrative:     69 y.o. male with PMH of MDS on chemotherapy currently, last dose was two days PTA, hypertension, hyperlipidemia, GERD, depression, anxiety, CAD (as/PE of stent placement), diverticulitis, hypotestosteronism, DVT, COPD, shingles, who presented with nausea, vomiting and periumbilical abdominal pain.  In ED, patient was found to have lactic acid 1.42-->0.40, negative troponin, negative urinalysis, negative lipase, pancytopenia with WBC 0.9, hemoglobin 7.3, platelet 17, creatinine 1.2, BUN 27, potassium normal. CT-head is negative. CT-abd/pelvis showed no acute abnormality to explain the patient's symptoms. X-ray of chest/abd showed unremarkable bowel gas pattern; no free intra-abdominal air seen; mild left-sided airspace opacity could reflect a loculated pleural effusion or mild infection, slightly less prominent than on the recent chest radiograph on 10/21/14.  Barriers to discharge: pt with persistent dry heaving, nausea and vomiting, plan for upper GI study today  Assessment/Plan:    Principal Problem:   Nausea & vomiting, abd pain  - Etiology is not clear. Lipase is negative - CT abdomen/pelvis did not show acute abnormalities to explain his symptoms.  - review of records indicate pt has known history of mild gastritis and duodenitis, so ? If this is contributing - continue PPI BID, GI team consulted for assistance  - plan for upper GI study, may need EGD as well but plt still low  - will transfuse additional unit of Plt today, d/w Dr. Jonette Eva   Active Problems:   Pancytopenia ([place]) - related to chemotherapy  - transfused two units of PRBC in [facility], 1 U PRBC 11/18/22 - transfused one U of Plt November 18, 2022, will transfuse additional unit today to see if this will help  bring Plt up so pt can have EGD - neupogen given 10/20 and WBC up to 4 this AM - blood counts overall improving  - CBC with diff in AM    Bradycardia - stopped metoprolol and HR has been WNL so far    MDS (myelodysplastic syndrome), high grade ([place]) - per oncology team    Pneumothorax, left, recent history - respiratory status is stable this AM    Acid reflux - continue PPI Protonix IV BID     Protein-calorie malnutrition, severe ([place]) - appreciate nutritionist consultation     Accelerated hypertension - exacerbated by N/V, dry heaving  - add Hydralazine as needed for SBP > 155    Coronary atherosclerosis of native coronary artery - continue metoprolol - hold aspirin due to pancytopenia     COPD (chronic obstructive pulmonary disease) ([place]) - stable respiratory status this AM  DVT prophylaxis - SCD  Code Status: Full.  Family Communication:  plan of care discussed with the patient Disposition Plan: Home when stable.   IV access:  Peripheral IV  Procedures and diagnostic studies:     Ct Head Wo Contrast 18-Nov-2014  Mild atrophy.  No acute intracranial abnormality.   Dg Abd Acute W/chest 2014-11-18 Unremarkable bowel gas pattern; no free intra-abdominal air seen. 2. Mild left-sided airspace opacity could reflect a loculated pleural effusion or mild infection, slightly less prominent than on the recent chest radiograph. 3. Borderline cardiomegaly.  Medical Consultants:  Oncology   GI  Other Consultants:  None  IAnti-Infectives:   None  Faye Ramsay, MD  [facility] Pager [phone]  If 7PM-7AM, please contact night-coverage [email or web address] Password  [email or web address] 11/04/2014, 6:46 AM   LOS: 2 days   HPI/Subjective: No events overnight. Worse this AM, with N/V, dry heaving   Objective: Filed Vitals:   11/02/14 2156 11/03/14 0703 11/03/14 1302 11/03/14 2033  BP: 98/58 134/74 113/63 120/65  Pulse: 59 68 60 69  Temp: 97.8 F (36.6 C) 97.7 F (36.5 C) 98.3 F  (36.8 C) 98.3 F (36.8 C)  TempSrc: Oral Oral Oral Oral  Resp: 18 20 20 18   Height:      Weight:      SpO2: 100% 100% 100% 98%    Intake/Output Summary (Last 24 hours) at 11/04/14 0646 Last data filed at 11/04/14 0400  Gross per 24 hour  Intake    200 ml  Output   2975 ml  Net  -2775 ml    Exam:   General:  Pt is alert, in mild distress due to vomiting   Cardiovascular: Regular rhythm, bradycardic, S1/S2, no rubs, no gallops  Respiratory: Clear to auscultation bilaterally, no wheezing, diminished breath sounds at bases   Abdomen: Soft, tender in epigastric area, non distended, bowel sounds present, no guarding  Extremities: pulses DP and PT palpable bilaterally   Data Reviewed: Basic Metabolic Panel:  Recent Labs Lab 10/31/14 0922 11/02/14 0002 11/02/14 0443 11/03/14 0520 11/04/14 0510  NA 134 139 137 139 141  K 3.7 4.2 3.6 3.4* 3.6  CL 104 105 108 107 106  CO2 25  --  22 25 26   GLUCOSE 107 149* 106* 82 124*  BUN 16 27* 21* 15 13  CREATININE 1.1 1.20 1.02 1.02 0.97  CALCIUM 8.5  --  7.7* 8.0* 8.3*   Liver Function Tests:  Recent Labs Lab 10/31/14 0922 11/01/14 2351 11/02/14 0443  AST 19 22 15   ALT 18 15* 14*  ALKPHOS 76 78 69  BILITOT 0.70 0.6 0.8  PROT 7.5 7.8 6.6  ALBUMIN 3.0* 3.1* 2.7*    Recent Labs Lab 11/01/14 2351  LIPASE 29   CBC:  Recent Labs Lab 10/31/14 0921 11/01/14 2351 11/02/14 0002 11/02/14 0443 11/03/14 0520 11/04/14 0510  WBC 1.8* 0.9*  --  0.8* 1.2* 4.0  NEUTROABS  --  0.7*  --   --  0.5*  --   HGB 5.8* 7.3* 7.5* 6.4* 7.1* 7.5*  HCT 17.8* 21.6* 22.0* 18.9* 20.9* 23.1*  MCV 87 82.8  --  84.4 85.0 84.3  PLT 19* 17*  --  13* 32* 35*   CBG:  Recent Labs Lab 11/02/14 0732 11/03/14 0754  GLUCAP 92 93   Scheduled Meds: . sodium chloride   Intravenous Once  . sodium chloride   Intravenous Once  . doxycycline  100 mg Oral Q12H  . feeding supplement  1 Container Oral BID BM  . feeding supplement (ENSURE  ENLIVE)  237 mL Oral BID BM  . filgrastim  480 mcg Subcutaneous Daily  . lidocaine  1 patch Transdermal Q24H  . OLANZapine  5 mg Oral Daily  . pantoprazole (PROTONIX) IV  40 mg Intravenous Q12H  . predniSONE  50 mg Oral Q breakfast  . simvastatin  20 mg Oral QHS  . sodium chloride  3 mL Intravenous Q12H   Continuous Infusions:

## 2014-11-04 NOTE — Progress Notes (Signed)
Telephone order received to remove sutures that patient has in place from previous chest tube removals. Pt had three incisions, with one suture per incisions in place, on the left side of his chest. All three sutures removed. Sites intact and no signs of infection. Placed steri strips over two sites at they had the potential to become unattached. Patient tolerated procedure well.   Othella Boyer [facility] 11/04/2014 7:05 PM

## 2014-11-04 NOTE — Progress Notes (Signed)
PT Cancellation Note  Patient Details Name: [name] MRN: [id number] DOB: [birth year]   Cancelled Treatment:    Reason Eval/Treat Not Completed: Other (comment) (nausea, RN requests to check back at a later time)   Syndey Jaskolski,KATHrine E 11/04/2014, 9:43 AM Carmelia Bake, PT, DPT 11/04/2014 Pager: [phone]

## 2014-11-04 NOTE — Progress Notes (Signed)
OT Cancellation Note  Patient Details Name: BURNETT SPRAY MRN: [id number] DOB: 08-29-1945   Cancelled Treatment:    Reason Eval/Treat Not Completed: Patient at procedure or test/ unavailable Will try back later time.  Jules Schick  [phone] 11/04/2014, 11:23 AM

## 2014-11-04 NOTE — Interval H&P Note (Signed)
History and Physical Interval Note:  11/04/2014 11:51 AM  [name]  has presented today for surgery, with the diagnosis of N+V  The various methods of treatment have been discussed with the patient and family. After consideration of risks, benefits and other options for treatment, the patient has consented to  Procedure(s): ESOPHAGOGASTRODUODENOSCOPY (EGD) (N/A) as a surgical intervention .  The patient's history has been reviewed, patient examined, no change in status, stable for surgery.  I have reviewed the patient's chart and labs.  Questions were answered to the patient's satisfaction.     Sequita Wise JR,Xochil Shanker L

## 2014-11-04 NOTE — Progress Notes (Addendum)
[name] will be gone for upper endoscopy today. He still has a lot of reflux and dyspepsia.  He did have a CT of the neck yesterday. This was pretty much unremarkable.  I wonder if he might not have Helicobacter infection does cause nausea is discomfort. I totally cannot rule out the effects of chemotherapy. However, he says his complaints his pain has been going on for several weeks. He has only had 2 doses of chemotherapy. It was a higher dose of chemotherapy however. As such, I cannot totally rule out the fact that he just has a difficult time tolerating treatment.  I put him on some Neupogen yesterday. His white cell count today is 4.  His hemoglobin is 7.5. His platelet count is 35,000.  With his thrombocytopenia, I still feel it is safe for an upper endoscopy. I think if the biopsy had to be done, the risk of bleeding would be minimal.  Because of all of his "allergies" it is difficult to get him on medicine that might help with the dyspepsia. Reglan certainly might be worthwhile, but according to his wife, he has had side effects from the Reglan.  Hopefully, the upper endoscopy will be done and Helicobacter will be found so this can be treated relatively easily.  He has not complained much of posthepatic neuralgia in the right T9 dermatome.  He's had no fever. He's had no bleeding. He's had no diarrhea.  He's had no rashes.  On his physical exam, his temperature is 97.4. Pulse 112. Blood pressure 153/86. Head and neck exam shows no ocular or oral lesions. There is no mucositis. There is no adenopathy in the neck. Lungs are clear. He has no wheezes or rhonchi. Cardiac exam regular rate and rhythm with no murmurs, rubs or bruits. Abdomen is soft. Bowel sounds are present. There is no epigastric tenderness to palpation. There is no distention. There is no fluid wave. There is no palpable hepatosplenomegaly. Extremities shows no clubbing, cyanosis or edema. His herpetic rash in the right  T9 dermatome is healing nicely.  We will see what the upper endoscopy shows.  I don't have much else to add right now.  I appreciate all the great care that he is getting from everybody up on [place]  1 Peter 3:8

## 2014-11-04 NOTE — H&P (View-Only) (Signed)
[name] Reason for [name]: Nausea and Vomiting Referring Physician: [facility] hospital[facility]. PCP: Dr. Kenton Kingfisher. Oncologists: Dr. Dr Marin Olp. G.I.: Dr. Venetia Constable is an 69 y.o. male.  HPI: He has myelodysplasia and is receiving chemotherapy by Dr. Dr Marin Olp. He apparently is not been responding very well to this. He has had persistent nausea with occasional vomiting that has been going on for some time. According to his wife, he has had this for over a year and it never really has improved. He was in the hospital last year and had a cholecystectomy and they report that this did not help his nausea and vomiting at all. At that time he had EGD subsequent colonoscopy by Dr. Michail Sermon. The EGD was fairly non-remarkable colonoscopy did reveal multiple polyps that were removed or adenomatous. The patient has had severe GERD symptoms and had been taking Protonix but reports that that quit working and he was placed on Komodo Dane which did not work any better. Due to his persistent nausea he's lost 40 pounds. He often will eat severely nauseatingly without vomiting but sometimes will vomit without eating. CT scan on admission did not show any obvious cause of the symptoms. Unfortunately his Thereasa Distance has not really responded to treatment and he was admitted with anemia, neutropenia, and thrombocytopenia with platelet count 17. He did receive some platelets that is gone up now to 33.  Past Medical History  Diagnosis Date  . Coronary atherosclerosis of native coronary artery     a. 10/2008 inf STEMI/PCI: LM 50d (IVUS-borderline lesion->med rx), LAD min irregs, LCX 71m 70d, OM nl, RCA [id number] Vision BMS);  b. 11/2008 Lexiscan MV: EF 65%, no isch/scar.  . Essential hypertension, benign   . Pure hypercholesterolemia   . AAA (abdominal aortic aneurysm) ([facility] [place]    a. 05/2013 CT: 5.8 cm AAA.  . Marland Kitcheniverticulitis     a. 05/2013 CT: descending/sigmoid jxn w/o abscess.  .  Osteoarthritis   . Tobacco abuse     a. ongoing - 1ppd for better part of 50 yrs.  . Normocytic anemia   . Cellulitis 06/10/2013    Right antecubital fossa at site of IV  03/12/13  . PONV (postoperative nausea and vomiting)   . Diverticulitis   . GERD (gastroesophageal reflux disease)   . MDS (myelodysplastic syndrome), high grade ([facility]3/30/2016  . Myocardial infarction ([facility]  . Depression   . Spontaneous pneumothorax 09/14/2014    LEFT LUNG  . Hypotestosteronemia 10/04/2014    Past Surgical History  Procedure Laterality Date  . Cardiac stents  2010  . Cholecystectomy N/A 06/19/2013    Procedure: LAPAROSCOPIC CHOLECYSTECTOMY;  Surgeon: DoHarl BowieMD;  [facility]: General;  Laterality: N/A;  . Abdominal aortic endovascular stent graft N/A 07/02/2013    Procedure: ABDOMINAL AORTIC ENDOVASCULAR STENT GRAFT;  Surgeon: VaSerafina MitchellMD;  [facility];  Service: Vascular;  Laterality: N/A;  . Esophagogastroduodenoscopy N/A 08/05/2013    Procedure: ESOPHAGOGASTRODUODENOSCOPY (EGD);  Surgeon: ViLear NgMD;  [facility];  Service: Endoscopy;  Laterality: N/A;  . Eye surgery Left     cataract surgery  . Back surgery      cervical fusion  . Colonoscopy with propofol N/A 10/15/2013    Procedure: COLONOSCOPY WITH PROPOFOL;  Surgeon: ViLear NgMD;  [facility]: [facility];  Laterality: N/A;  . Fetal blood transfusion  March 16,17,18, 2016  . Bone marrow biopsy  April 01, 2014  . Chest tube insertion  09/14/2014  . Video assisted thoracoscopy Left 10/11/2014    Procedure: VIDEO ASSISTED THORACOSCOPY;  Surgeon: Ivin Poot, MD;  Location: [place];  Service: Thoracic;  Laterality: Left;  . Stapling of blebs Left 10/11/2014    Procedure: STAPLING OF BLEBS;  Surgeon: Ivin Poot, MD;  Location: [facility] OR;  Service: Thoracic;  Laterality: Left;    Family History  Problem Relation Age of Onset  . Heart attack  Brother     53s  . Cancer Father     Lung  . Cancer Mother     Brain  . Cancer Brother     Kidney    Social History:  reports that he has quit smoking. His smoking use included Cigarettes. He started smoking about 50 years ago. He has a 60 pack-year smoking history. He has never used smokeless tobacco. He reports that he does not drink alcohol or use illicit drugs.  Allergies:  Allergies  Allergen Reactions  . Albuterol-Ipratropium-Soybean Lecithin [Ipratropium-Albuterol] Anxiety  . Cephalexin Other (See Comments)    Nausea and stomach cramps  . Compazine [Prochlorperazine] Anxiety  . Percocet [Oxycodone-Acetaminophen] Itching    Can take with benadryl    . Phenergan [Promethazine Hcl] Other (See Comments)    "restless legs"  . Tramadol Other (See Comments)    "jerking limbs and talking in his sleep"  . Vicodin [Hydrocodone-Acetaminophen] Itching    Can take with benadryl   . Marinol [Dronabinol] Other (See Comments)    "Affects his eyes"  . Reglan [Metoclopramide] Other (See Comments)    Patient has significant shakes that are troubling. Would not like to receive this if there are other options available.    Medications; Prior to Admission medications   Medication Sig Start Date End Date Taking? Authorizing Provider  acetaminophen (TYLENOL) 500 MG tablet Take 500 mg by mouth every 6 (six) hours as needed for fever.   Yes Historical Provider, MD  ALPRAZolam (XANAX) 0.25 MG tablet Take 1 tablet (0.25 mg total) by mouth 3 (three) times daily as needed for anxiety. 10/03/14  Yes Volanda Napoleon, MD  aspirin EC 81 MG tablet Take 1 tablet (81 mg total) by mouth daily. 03/30/14  Yes Sherren Mocha, MD  carboxymethylcellulose (REFRESH TEARS) 0.5 % SOLN Place 2 drops into both eyes 3 (three) times daily as needed (For dry eyes.).   Yes Historical Provider, MD  diphenhydrAMINE (BENADRYL) 25 mg capsule Take 1 capsule (25 mg total) by mouth every 6 (six) hours as needed for itching. May  take with Percocet PRN 10/17/14  Yes Donielle Liston Alba, PA-C  famciclovir (FAMVIR) 500 MG tablet Take 1 tablet (500 mg total) by mouth daily. 10/27/14  Yes Volanda Napoleon, MD  famotidine (PEPCID AC) 10 MG chewable tablet Chew 10 mg by mouth 2 (two) times daily.   Yes Historical Provider, MD  feeding supplement, ENSURE ENLIVE, (ENSURE ENLIVE) LIQD Take 237 mLs by mouth 2 (two) times daily between meals. 10/17/14  Yes Donielle Liston Alba, PA-C  HYDROcodone-acetaminophen (NORCO/VICODIN) 5-325 MG tablet Take 1 tablet by mouth every 6 (six) hours as needed for moderate pain. 11/01/14  Yes Eliezer Bottom, NP  lidocaine-prilocaine (EMLA) cream Apply 1 application topically as needed. Please dispense 2 tubes 10/12/14  Yes Volanda Napoleon, MD  LORazepam (ATIVAN) 0.5 MG tablet Take 1 tablet (0.5 mg total) by mouth every 6 (six) hours as needed (Nausea or vomiting). 04/11/14  Yes Collier Salina  Oletha Cruel, MD  metoprolol tartrate (LOPRESSOR) 25 MG tablet Take 0.5 tablets (12.5 mg total) by mouth 2 (two) times daily. 08/03/13  Yes Sherren Mocha, MD  nitroGLYCERIN (NITROSTAT) 0.4 MG SL tablet Place 1 tablet (0.4 mg total) under the tongue every 5 (five) minutes as needed for chest pain. 03/29/14  Yes Sherren Mocha, MD  ondansetron (ZOFRAN ODT) 4 MG disintegrating tablet Take 1 tablet (4 mg total) by mouth every 8 (eight) hours as needed for nausea or vomiting. 11/01/14  Yes Volanda Napoleon, MD  Potassium Chloride 40 MEQ/15ML (20%) SOLN Take 40 mEq by mouth 2 (two) times daily. Patient taking differently: Take 40 mEq by mouth every other day.  10/19/14  Yes Volanda Napoleon, MD  simvastatin (ZOCOR) 20 MG tablet Take 1 tablet (20 mg total) by mouth at bedtime. 03/30/14  Yes Sherren Mocha, MD  temazepam (RESTORIL) 15 MG capsule Take 15 mg by mouth at bedtime as needed for sleep.   Yes Historical Provider, MD  testosterone cypionate (DEPOTESTOTERONE CYPIONATE) 100 MG/ML injection Inject 400 mg into the muscle every 28  (twenty-eight) days. For IM use only   Yes Historical Provider, MD  lidocaine (LIDODERM) 5 % Place 1 patch onto the skin daily. Remove & Discard patch within 12 hours or as directed by MD 10/31/14   Volanda Napoleon, MD  potassium chloride SA (K-DUR,KLOR-CON) 20 MEQ tablet Take 2 tablets (40 mEq total) by mouth 2 (two) times daily. Patient not taking: Reported on 11/01/2014 10/20/14   Volanda Napoleon, MD  thiamine 100 MG tablet Take 1 tablet (100 mg total) by mouth daily. Patient not taking: Reported on 11/01/2014 10/17/14   Nani Skillern, PA-C   . sodium chloride   Intravenous Once  . sodium chloride   Intravenous Once  . feeding supplement  1 Container Oral BID BM  . feeding supplement (ENSURE ENLIVE)  237 mL Oral BID BM  . filgrastim  480 mcg Subcutaneous Daily  . lidocaine  1 patch Transdermal Q24H  . OLANZapine  5 mg Oral Daily  . pantoprazole (PROTONIX) IV  40 mg Intravenous Q12H  . simvastatin  20 mg Oral QHS  . sodium chloride  3 mL Intravenous Q12H   PRN Meds acetaminophen, ALPRAZolam, diphenhydrAMINE, HYDROmorphone (DILAUDID) injection, lidocaine-prilocaine, nitroGLYCERIN, ondansetron (ZOFRAN) IV, polyvinyl alcohol, temazepam Results for orders placed or performed during the hospital encounter of 11/01/14 (from the past 48 hour(s))  CBC with Differential/Platelet     Status: Abnormal   Collection Time: 11/01/14 11:51 PM  Result Value Ref Range   WBC 0.9 (LL) 4.0 - 10.5 K/uL    Comment: REPEATED TO VERIFY WHITE COUNT CONFIRMED ON SMEAR CRITICAL RESULT CALLED TO, READ BACK BY AND VERIFIED WITH: T DOSTER RN 0030 11/02/14 A NAVARRO    RBC 2.61 (L) 4.22 - 5.81 MIL/uL   Hemoglobin 7.3 (L) 13.0 - 17.0 g/dL   HCT 21.6 (L) 39.0 - 52.0 %   MCV 82.8 78.0 - 100.0 fL   MCH 28.0 26.0 - 34.0 pg   MCHC 33.8 30.0 - 36.0 g/dL   RDW 13.9 11.5 - 15.5 %   Platelets 17 (LL) 150 - 400 K/uL    Comment: REPEATED TO VERIFY SPECIMEN CHECKED FOR CLOTS PLATELET COUNT CONFIRMED BY  SMEAR CRITICAL RESULT CALLED TO, READ BACK BY AND VERIFIED WITH: T DOSTER RN 0030 11/02/14 A NAVARRO    Neutrophils Relative % 80 %   Neutro Abs 0.7 (L) 1.7 - 7.7 K/uL  Lymphocytes Relative 16 %   Lymphs Abs 0.2 (L) 0.7 - 4.0 K/uL   Monocytes Relative 3 %   Monocytes Absolute 0.0 (L) 0.1 - 1.0 K/uL   Eosinophils Relative 0 %   Eosinophils Absolute 0.0 0.0 - 0.7 K/uL   Basophils Relative 0 %   Basophils Absolute 0.0 0.0 - 0.1 K/uL  Lipase, blood     Status: None   Collection Time: 11/01/14 11:51 PM  Result Value Ref Range   Lipase 29 22 - 51 U/L  Hepatic function panel     Status: Abnormal   Collection Time: 11/01/14 11:51 PM  Result Value Ref Range   Total Protein 7.8 6.5 - 8.1 g/dL   Albumin 3.1 (L) 3.5 - 5.0 g/dL   AST 22 15 - 41 U/L   ALT 15 (L) 17 - 63 U/L   Alkaline Phosphatase 78 38 - 126 U/L   Total Bilirubin 0.6 0.3 - 1.2 mg/dL   Bilirubin, Direct 0.2 0.1 - 0.5 mg/dL   Indirect Bilirubin 0.4 0.3 - 0.9 mg/dL  I-stat chem 8, ed     Status: Abnormal   Collection Time: 11/02/14 12:02 AM  Result Value Ref Range   Sodium 139 135 - 145 mmol/L   Potassium 4.2 3.5 - 5.1 mmol/L   Chloride 105 101 - 111 mmol/L   BUN 27 (H) 6 - 20 mg/dL   Creatinine, Ser 1.20 0.61 - 1.24 mg/dL   Glucose, Bld 149 (H) 65 - 99 mg/dL   Calcium, Ion 1.10 (L) 1.13 - 1.30 mmol/L   TCO2 21 0 - 100 mmol/L   Hemoglobin 7.5 (L) 13.0 - 17.0 g/dL   HCT 22.0 (L) 39.0 - 52.0 %  I-Stat CG4 Lactic Acid, ED     Status: None   Collection Time: 11/02/14 12:03 AM  Result Value Ref Range   Lactic Acid, Venous 1.42 0.5 - 2.0 mmol/L  I-stat troponin, ED     Status: None   Collection Time: 11/02/14  1:29 AM  Result Value Ref Range   Troponin i, poc 0.01 0.00 - 0.08 ng/mL   Comment 3            Comment: Due to the release kinetics of cTnI, a negative result within the first hours of the onset of symptoms does not rule out myocardial infarction with certainty. If myocardial infarction is still  suspected, repeat the test at appropriate intervals.   Urinalysis, Routine w reflex microscopic (not at [facility])     Status: None   Collection Time: 11/02/14  2:02 AM  Result Value Ref Range   Color, Urine YELLOW YELLOW   APPearance CLEAR CLEAR   Specific Gravity, Urine 1.017 1.005 - 1.030   pH 6.0 5.0 - 8.0   Glucose, UA NEGATIVE NEGATIVE mg/dL   Hgb urine dipstick NEGATIVE NEGATIVE   Bilirubin Urine NEGATIVE NEGATIVE   Ketones, ur NEGATIVE NEGATIVE mg/dL   Protein, ur NEGATIVE NEGATIVE mg/dL   Urobilinogen, UA 1.0 0.0 - 1.0 mg/dL   Nitrite NEGATIVE NEGATIVE   Leukocytes, UA NEGATIVE NEGATIVE    Comment: MICROSCOPIC NOT DONE ON URINES WITH NEGATIVE PROTEIN, BLOOD, LEUKOCYTES, NITRITE, OR GLUCOSE <1000 mg/dL.  I-Stat CG4 Lactic Acid, ED     Status: Abnormal   Collection Time: 11/02/14  3:08 AM  Result Value Ref Range   Lactic Acid, Venous 0.40 (L) 0.5 - 2.0 mmol/L  Protime-INR     Status: Abnormal   Collection Time: 11/02/14  4:43 AM  Result Value  Ref Range   Prothrombin Time 17.2 (H) 11.6 - 15.2 seconds   INR 1.39 0.00 - 1.49  APTT     Status: None   Collection Time: 11/02/14  4:43 AM  Result Value Ref Range   aPTT 36 24 - 37 seconds  Comprehensive metabolic panel     Status: Abnormal   Collection Time: 11/02/14  4:43 AM  Result Value Ref Range   Sodium 137 135 - 145 mmol/L   Potassium 3.6 3.5 - 5.1 mmol/L   Chloride 108 101 - 111 mmol/L   CO2 22 22 - 32 mmol/L   Glucose, Bld 106 (H) 65 - 99 mg/dL   BUN 21 (H) 6 - 20 mg/dL   Creatinine, Ser 1.02 0.61 - 1.24 mg/dL   Calcium 7.7 (L) 8.9 - 10.3 mg/dL   Total Protein 6.6 6.5 - 8.1 g/dL   Albumin 2.7 (L) 3.5 - 5.0 g/dL   AST 15 15 - 41 U/L   ALT 14 (L) 17 - 63 U/L   Alkaline Phosphatase 69 38 - 126 U/L   Total Bilirubin 0.8 0.3 - 1.2 mg/dL   GFR calc non Af Amer >60 >60 mL/min   GFR calc Af Amer >60 >60 mL/min    Comment: (NOTE) The eGFR has been calculated using the CKD EPI equation. This calculation has not been  validated in all clinical situations. eGFR's persistently <60 mL/min signify possible Chronic Kidney Disease.    Anion gap 7 5 - 15  CBC     Status: Abnormal   Collection Time: 11/02/14  4:43 AM  Result Value Ref Range   WBC 0.8 (LL) 4.0 - 10.5 K/uL    Comment: REPEATED TO VERIFY CRITICAL VALUE NOTED.  VALUE IS CONSISTENT WITH PREVIOUSLY REPORTED AND CALLED VALUE.    RBC 2.24 (L) 4.22 - 5.81 MIL/uL   Hemoglobin 6.4 (LL) 13.0 - 17.0 g/dL    Comment: REPEATED TO VERIFY CRITICAL RESULT CALLED TO, READ BACK BY AND VERIFIED WITH: [facility] RN 0515 11/02/14 A NAVARRO    HCT 18.9 (L) 39.0 - 52.0 %   MCV 84.4 78.0 - 100.0 fL   MCH 28.6 26.0 - 34.0 pg   MCHC 33.9 30.0 - 36.0 g/dL   RDW 14.2 11.5 - 15.5 %   Platelets 13 (LL) 150 - 400 K/uL    Comment: REPEATED TO VERIFY SPECIMEN CHECKED FOR CLOTS CRITICAL VALUE NOTED.  VALUE IS CONSISTENT WITH PREVIOUSLY REPORTED AND CALLED VALUE. CONSISTENT WITH PREVIOUS RESULT   Prepare RBC     Status: None   Collection Time: 11/02/14  6:30 AM  Result Value Ref Range   Order Confirmation ORDER PROCESSED BY BLOOD BANK   Glucose, capillary     Status: None   Collection Time: 11/02/14  7:32 AM  Result Value Ref Range   Glucose-Capillary 92 65 - 99 mg/dL  Type and screen Cucumber     Status: None   Collection Time: 11/02/14  9:00 AM  Result Value Ref Range   ABO/RH(D) A POS    Antibody Screen NEG    Sample Expiration 11/05/2014    Unit Number [id number]    Blood Component Type RBC, LR IRR    Unit division 00    Status of Unit ISSUED,FINAL    Transfusion Status OK TO TRANSFUSE    Crossmatch Result Compatible   Prepare Pheresed Platelets     Status: None   Collection Time: 11/02/14  1:00 PM  Result Value Ref  Range   Unit Number [id number]    Blood Component Type PLTPHER LI2    Unit division 00    Status of Unit ISSUED,FINAL    Transfusion Status OK TO TRANSFUSE   Basic metabolic panel     Status: Abnormal    Collection Time: 11/03/14  5:20 AM  Result Value Ref Range   Sodium 139 135 - 145 mmol/L   Potassium 3.4 (L) 3.5 - 5.1 mmol/L   Chloride 107 101 - 111 mmol/L   CO2 25 22 - 32 mmol/L   Glucose, Bld 82 65 - 99 mg/dL   BUN 15 6 - 20 mg/dL   Creatinine, Ser 1.02 0.61 - 1.24 mg/dL   Calcium 8.0 (L) 8.9 - 10.3 mg/dL   GFR calc non Af Amer >60 >60 mL/min   GFR calc Af Amer >60 >60 mL/min    Comment: (NOTE) The eGFR has been calculated using the CKD EPI equation. This calculation has not been validated in all clinical situations. eGFR's persistently <60 mL/min signify possible Chronic Kidney Disease.    Anion gap 7 5 - 15  CBC with Differential/Platelet     Status: Abnormal   Collection Time: 11/03/14  5:20 AM  Result Value Ref Range   WBC 1.2 (LL) 4.0 - 10.5 K/uL    Comment: CRITICAL VALUE NOTED.  VALUE IS CONSISTENT WITH PREVIOUSLY REPORTED AND CALLED VALUE.   RBC 2.46 (L) 4.22 - 5.81 MIL/uL   Hemoglobin 7.1 (L) 13.0 - 17.0 g/dL   HCT 20.9 (L) 39.0 - 52.0 %   MCV 85.0 78.0 - 100.0 fL   MCH 28.9 26.0 - 34.0 pg   MCHC 34.0 30.0 - 36.0 g/dL   RDW 14.3 11.5 - 15.5 %   Platelets 32 (L) 150 - 400 K/uL    Comment: PLATELET COUNT CONFIRMED BY SMEAR   Neutrophils Relative % 47 %   Lymphocytes Relative 48 %   Monocytes Relative 5 %   Eosinophils Relative 0 %   Basophils Relative 0 %   Neutro Abs 0.5 (L) 1.7 - 7.7 K/uL   Lymphs Abs 0.6 (L) 0.7 - 4.0 K/uL   Monocytes Absolute 0.1 0.1 - 1.0 K/uL   Eosinophils Absolute 0.0 0.0 - 0.7 K/uL   Basophils Absolute 0.0 0.0 - 0.1 K/uL   WBC Morphology WHITE COUNT CONFIRMED ON SMEAR   Glucose, capillary     Status: None   Collection Time: 11/03/14  7:54 AM  Result Value Ref Range   Glucose-Capillary 93 65 - 99 mg/dL    Ct Head Wo Contrast  11/02/2014  CLINICAL DATA:  Altered mental status. Episodes of verbal unresponsiveness. Severe nausea. EXAM: CT HEAD WITHOUT CONTRAST TECHNIQUE: Contiguous axial images were obtained from the base of the  skull through the vertex without intravenous contrast. COMPARISON:  None. FINDINGS: Mild generalized cerebral and cerebellar atrophy.No intracranial hemorrhage, mass effect, or midline shift. No hydrocephalus. The basilar cisterns are patent. No evidence of territorial infarct. No intracranial fluid collection. Calvarium is intact. Included paranasal sinuses and mastoid air cells are well aerated. IMPRESSION: Mild atrophy.  No acute intracranial abnormality. Electronically Signed   By: Jeb Levering M.D.   On: 11/02/2014 00:50   Ct Abdomen Pelvis W Contrast  11/02/2014  CLINICAL DATA:  Acute onset of nausea and vomiting, status post recent chemotherapy. Known bone metastases. Generalized abdominal pain and neutropenia. Initial encounter. EXAM: CT ABDOMEN AND PELVIS WITH CONTRAST TECHNIQUE: Multidetector CT imaging of the abdomen and pelvis was performed using  the standard protocol following bolus administration of intravenous contrast. CONTRAST:  173mL OMNIPAQUE IOHEXOL 300 MG/ML  SOLN COMPARISON:  CT of the chest, abdomen and pelvis performed 03/30/2014 FINDINGS: Minimal left basilar scarring is noted. Scattered coronary artery calcifications are seen. The liver is unremarkable in appearance. The spleen is enlarged, measuring 15.7 cm in length. The patient is status post cholecystectomy, with clips noted at the gallbladder fossa. The pancreas and adrenal glands are unremarkable. Small bilateral renal cysts are seen, measuring up to 2.3 cm in size. The kidneys are otherwise unremarkable in appearance. There is no evidence of hydronephrosis. No renal or ureteral stones are seen. No perinephric stranding is appreciated. No free fluid is identified. The small bowel is unremarkable in appearance. The stomach is within normal limits. No acute vascular abnormalities are seen. An IVC filter is noted. The patient's aortoiliac stent graft is grossly unremarkable in appearance. The appendix is normal in caliber,  without evidence of appendicitis. Scattered diverticulosis is noted along the descending and sigmoid colon, without evidence of diverticulitis. The bladder is mildly distended and grossly unremarkable. The prostate is enlarged, measuring 5.3 cm in transverse dimension. No inguinal lymphadenopathy is seen. No acute osseous abnormalities are identified. Vacuum phenomenon is noted at L5-S1, with endplate sclerotic change. IMPRESSION: 1. No acute abnormality seen to explain the patient's symptoms. 2. Scattered diverticulosis along the descending and sigmoid colon, without evidence of diverticulitis. 3. Aortoiliac stent graft is grossly unremarkable in appearance. 4. Small bilateral renal cysts seen. 5. Scattered coronary artery calcifications noted. 6. Splenomegaly noted. 7. Enlarged prostate seen. Electronically Signed   By: Garald Balding M.D.   On: 11/02/2014 02:54   Dg Abd Acute W/chest  11/02/2014  CLINICAL DATA:  Acute onset of severe nausea. Patient on radiation therapy and chemotherapy for bone cancer. Initial encounter. EXAM: DG ABDOMEN ACUTE W/ 1V CHEST COMPARISON:  Chest radiograph performed 10/21/2014, and abdominal radiograph performed 10/12/2014 FINDINGS: The lungs are well-aerated. Mild left-sided airspace opacity could reflect a loculated effusion or mild infection. There is no evidence of pneumothorax. The cardiomediastinal silhouette is borderline enlarged. A right-sided chest port is noted ending about the distal SVC. The visualized bowel gas pattern is unremarkable. Scattered stool and air are seen within the colon; there is no evidence of small bowel dilatation to suggest obstruction. No free intra-abdominal air is identified on the provided decubitus view. No acute osseous abnormalities are seen; the sacroiliac joints are unremarkable in appearance. An IVC filter is noted. An aortoiliac stent graft is seen. Clips are noted within the right upper quadrant, reflecting prior cholecystectomy.  IMPRESSION: 1. Unremarkable bowel gas pattern; no free intra-abdominal air seen. 2. Mild left-sided airspace opacity could reflect a loculated pleural effusion or mild infection, slightly less prominent than on the recent chest radiograph. 3. Borderline cardiomegaly. Electronically Signed   By: Garald Balding M.D.   On: 11/02/2014 01:02               Blood pressure 134/74, pulse 68, temperature 97.7 F (36.5 C), temperature source Oral, resp. rate 20, height $RemoveBe'5\' 10"'GHlVcCHkV$  (1.778 m), weight 76.3 kg (168 lb 3.4 oz), SpO2 100 %.  Physical exam:   General-- pleasant white male in the acute distress ENT-- nonicteric Neck-- no lymphadenopathy Heart-- regular rate and rhythm without murmurs or gallops Lungs-- clear Abdomen-- soft and nontender with normal bowel sounds Psych-- alert and oriented times 3   Assessment: 1. Nausea and Vomiting. This is been going on for some time and he  has been evaluated last year and even had cholecystectomy none of this is really helped his symptoms. With severe thrombocytopenia neutropenia I'm very hasn't to perform EGD and less absolutely needed. 2. Myelodysplasia. This is been unresponsive to therapy and he is severely neutropenia , and thrombocytopenia. 3. Multiple other problems as noted above.  Plan: agree with aggressive acid producing therapy with IV Protonix will obtain upper G.I. series at this point in time rather than EGD due to his bone marrow suppression.   Khushboo Chuck JR,Jamael Hoffmann L 11/03/2014, 9:30 AM   Pager: [phone] If no answer or after hours call [phone]

## 2014-11-05 DIAGNOSIS — R221 Localized swelling, mass and lump, neck: Secondary | ICD-10-CM

## 2014-11-05 DIAGNOSIS — K2210 Ulcer of esophagus without bleeding: Principal | ICD-10-CM

## 2014-11-05 LAB — BASIC METABOLIC PANEL
Anion gap: 6 (ref 5–15)
BUN: 13 mg/dL (ref 6–20)
CALCIUM: 8.3 mg/dL — AB (ref 8.9–10.3)
CO2: 29 mmol/L (ref 22–32)
CREATININE: 1.08 mg/dL (ref 0.61–1.24)
Chloride: 104 mmol/L (ref 101–111)
GFR calc Af Amer: >60 mL/min (ref >60)
GFR calc non Af Amer: >60 mL/min (ref >60)
GLUCOSE: 80 mg/dL (ref 65–99)
Potassium: 2.9 mmol/L — ABNORMAL LOW (ref 3.5–5.1)
Sodium: 139 mmol/L (ref 135–145)

## 2014-11-05 LAB — CBC
HEMATOCRIT: 21.9 % — AB (ref 39.0–52.0)
Hemoglobin: 7.4 g/dL — ABNORMAL LOW (ref 13.0–17.0)
MCH: 29.1 pg (ref 26.0–34.0)
MCHC: 33.8 g/dL (ref 30.0–36.0)
MCV: 86.2 fL (ref 78.0–100.0)
Platelets: 50 10*3/uL — ABNORMAL LOW (ref 150–400)
RBC: 2.54 MIL/uL — ABNORMAL LOW (ref 4.22–5.81)
RDW: 14.1 % (ref 11.5–15.5)
WBC: 4 10*3/uL (ref 4.0–10.5)

## 2014-11-05 LAB — GLUCOSE, CAPILLARY: Glucose-Capillary: 78 mg/dL (ref 65–99)

## 2014-11-05 MED ORDER — POLYETHYLENE GLYCOL 3350 17 G PO PACK
17.0000 g | PACK | Freq: Every day | ORAL | Status: DC
  Administered 2014-11-05 – 2014-11-08 (×4): 17 g via ORAL
  Filled 2014-11-05 (×4): qty 1

## 2014-11-05 MED ORDER — ALUM & MAG HYDROXIDE-SIMETH 200-200-20 MG/5ML PO SUSP
15.0000 mL | ORAL | Status: DC | PRN
  Administered 2014-11-05 – 2014-11-06 (×3): 15 mL via ORAL
  Filled 2014-11-05 (×3): qty 30

## 2014-11-05 MED ORDER — POTASSIUM CHLORIDE CRYS ER 20 MEQ PO TBCR
40.0000 meq | EXTENDED_RELEASE_TABLET | Freq: Once | ORAL | Status: AC
Start: 2014-11-05 — End: 2014-11-05
  Administered 2014-11-05: 40 meq via ORAL
  Filled 2014-11-05: qty 2

## 2014-11-05 MED ORDER — SODIUM CHLORIDE 0.9 % IJ SOLN
10.0000 mL | INTRAMUSCULAR | Status: DC | PRN
  Administered 2014-11-06 – 2014-11-08 (×2): 10 mL
  Filled 2014-11-05 (×2): qty 40

## 2014-11-05 MED ORDER — SODIUM CHLORIDE 0.9 % IV SOLN
80.0000 mg | Freq: Two times a day (BID) | INTRAVENOUS | Status: DC
  Administered 2014-11-05 – 2014-11-08 (×6): 80 mg via INTRAVENOUS
  Filled 2014-11-05 (×10): qty 80

## 2014-11-05 MED ORDER — SENNOSIDES-DOCUSATE SODIUM 8.6-50 MG PO TABS
1.0000 | ORAL_TABLET | Freq: Two times a day (BID) | ORAL | Status: DC
  Administered 2014-11-05 – 2014-11-08 (×7): 1 via ORAL
  Filled 2014-11-05 (×7): qty 1

## 2014-11-05 MED ORDER — PREDNISONE 20 MG PO TABS
20.0000 mg | ORAL_TABLET | Freq: Once | ORAL | Status: AC
  Administered 2014-11-05: 20 mg via ORAL
  Filled 2014-11-05: qty 1

## 2014-11-05 MED ORDER — PREDNISONE 5 MG PO TABS
10.0000 mg | ORAL_TABLET | Freq: Once | ORAL | Status: AC
  Administered 2014-11-06: 10 mg via ORAL
  Filled 2014-11-05: qty 2

## 2014-11-05 MED ORDER — ONDANSETRON HCL 4 MG/2ML IJ SOLN
4.0000 mg | INTRAMUSCULAR | Status: DC
  Administered 2014-11-05 – 2014-11-08 (×19): 4 mg via INTRAVENOUS
  Filled 2014-11-05 (×19): qty 2

## 2014-11-05 NOTE — Progress Notes (Signed)
OT Cancellation Note  Patient Details Name: [name] MRN: [id number] DOB: 05-May-1945   Cancelled Treatment:    Reason Eval/Treat Not Completed: Other (comment).  Attempted eval twice:  Pt sleepy on first attempt and nauseous on second.  Will reattempt tomorrow, if schedule permits.  Otherwise, will see pt Monday.  Jewelianna Pancoast 11/05/2014, 3:31 PM  Lesle Chris, OTR/L [phone] 11/05/2014

## 2014-11-05 NOTE — Progress Notes (Signed)
Pt has decreased urine out put. Pt unable to void and states do not have the urgency to do so. Will continue monitor. MD notified.  SRP, RN

## 2014-11-05 NOTE — Progress Notes (Signed)
Pt c/o of heart burn and reflux no active nausea. Will cont to monitor. SRP, RN

## 2014-11-05 NOTE — Progress Notes (Signed)
[name] looks a lot better this morning. It is possible that the Zofran that he is getting around-the-clock is helping. I taught him about the possibility of using a Zofran patch. Unfortunately, the hospital does not carry this for inpatients.  His upper endoscopy showed esophageal ulcer. Biopsies were taken.  He's had a good night. He is able to rest.  He thinks that he needs to be on as few medicines possible. We'll see about making some adjustments.  His shingles is resolving nicely.  He wants to try to have regular food now. I think this should be very reasonable.  He's had no bleeding.  Vital see any issues with iron overload. I think the ferritin is an acute phase reactant. His iron saturation is only 52%. His total serum iron is only 76. As such, he does not need to be on any type of iron chelating agent.  His liver were today shows wise account before. Hemoglobin 7.4 and plan to count 50,000. He did get a unit of platelets yesterday.  He still feels weak. Hope, he'll be oh to walk around a little bit today.  On his physical exam, his vital signs are pretty stable. His temperature 98.6. Pulse 76. Blood pressure 129/67. Head and neck exam shows no ocular or oral lesions. He has no adenopathy. Lungs are clear. Cardiac exam regular rate and rhythm with no murmurs, rubs or bruits. Abdomen is soft. He has decent bowel sounds. There is no guarding or rebound tenderness. There is no palpable liver or spleen. Extremities shows some trace edema in his legs.  I'm just thankful that he feels and looks better. Hopefully, he will be able to eat. Hopefully, his "reflux" will improve. Maybe the Zofran will help.  Again, cutting back his medications I think will help.  He is getting great care. He and his wife are very happy with a wonderful care he's got it from everybody up on [place] 55:22

## 2014-11-05 NOTE — Progress Notes (Signed)
Pt has decreased urine out put, NP notified I/O cath order received, will discuss with patient. SRP, RN

## 2014-11-05 NOTE — Progress Notes (Signed)
Patient bladder scanned = 470cc. Patient then voided 100cc. MD notified and orders obtained for I&O cath prn. Patient then voided 450cc prior to cath, therefore I&O cath not needed.    Barbee Shropshire. Brigitte Pulse, RN

## 2014-11-05 NOTE — Progress Notes (Addendum)
Patient ID: [name], male   DOB: 03/08/1945, 69 y.o.   MRN: [id number]  [facility] PROGRESS NOTE  KHYLER ESCHMANN GYJ:[id number] DOB: 11-26-1945 DOA: 11/01/2014 PCP: Shirline Frees, MD   Brief narrative:     69 y.o. male with PMH of MDS on chemotherapy currently, last dose was two days PTA, hypertension, hyperlipidemia, GERD, depression, anxiety, CAD (as/PE of stent placement), diverticulitis, hypotestosteronism, DVT, COPD, shingles, who presented with nausea, vomiting and periumbilical abdominal pain.  In ED, patient was found to have lactic acid 1.42-->0.40, negative troponin, negative urinalysis, negative lipase, pancytopenia with WBC 0.9, hemoglobin 7.3, platelet 17, creatinine 1.2, BUN 27, potassium normal. CT-head is negative. CT-abd/pelvis showed no acute abnormality to explain the patient's symptoms. X-ray of chest/abd showed unremarkable bowel gas pattern; no free intra-abdominal air seen; mild left-sided airspace opacity could reflect a loculated pleural effusion or mild infection, slightly less prominent than on the recent chest radiograph on 10/21/14.  Assessment/Plan:    Principal Problem:   Nausea & vomiting, abd pain  - per EGD done on 10/21, esophageal ulcer was noted, biopsies taken - pt looks much better this AM  - CT abdomen/pelvis did not show acute abnormalities to explain his symptoms.  - continue PPI BID, appreciate GI team assistance  - advance diet to regular today   Active Problems:   Pancytopenia ([place]) - related to chemotherapy  - transfused two units of PRBC in [facility], 1 U PRBC 12-02-2022 - transfused one U of Plt December 02, 2022, 1 U tx 10/21 - neupogen given 10/20 - blood counts overall improving  - CBC with diff in AM    Sialadenitis - d/w ENT Dr. Simeon Craft, advised to continue Prednisone with tapering - will place on Prednisone 20 mg today, 10 mg tomorrow and then stop  - ok to stop doxy today     Hypokalemia - supplement and repeat BMP in AM     Bradycardia - stopped metoprolol and HR has been WNL so far    MDS (myelodysplastic syndrome), high grade ([place]) - per oncology team    Pneumothorax, left, recent history - respiratory status is stable this AM    Acid reflux - continue PPI Protonix IV BID     Protein-calorie malnutrition, severe ([place]) - appreciate nutritionist consultation     Accelerated hypertension - exacerbated by N/V, dry heaving  - added Hydralazine as needed for SBP > 155 - BP is more stable this AM     Coronary atherosclerosis of native coronary artery - continue metoprolol - hold aspirin due to pancytopenia     COPD (chronic obstructive pulmonary disease) (HCC) - stable respiratory status this AM  DVT prophylaxis - SCD  Code Status: Full.  Family Communication:  plan of care discussed with the patient and wife at bedside, son over the phone, spent 45 minutes to discuss further plan of care and current test results Disposition Plan: Home by 10/24 if blood counts stable and pt tolerating diet well   IV access:  Peripheral IV  Procedures and diagnostic studies:     Ct Head Wo Contrast 12-02-14  Mild atrophy.  No acute intracranial abnormality.   Dg Abd Acute W/chest 2014-12-02 Unremarkable bowel gas pattern; no free intra-abdominal air seen. 2. Mild left-sided airspace opacity could reflect a loculated pleural effusion or mild infection, slightly less prominent than on the recent chest radiograph. 3. Borderline cardiomegaly.  Medical Consultants:  Oncology   GI  Other Consultants:  None  IAnti-Infectives:   None  Faye Ramsay, MD  [facility] Pager [phone]  If 7PM-7AM, please contact night-coverage [email or web address] Password [email or web address] 11/05/2014, 2:18 PM   LOS: 3 days   HPI/Subjective: No events overnight. Less nausea this AM, wants to eat.   Objective: Filed Vitals:   11/04/14 1504 11/04/14 2118 11/05/14 0546 11/05/14 1403  BP: 139/85 120/65 129/67 139/68  Pulse: 88 69 76 72   Temp: 97.6 F (36.4 C) 98.4 F (36.9 C) 98.6 F (37 C) 98.2 F (36.8 C)  TempSrc: Axillary Oral Oral Oral  Resp: 16 16 16 16   Height:      Weight:      SpO2: 97% 98% 96% 97%    Intake/Output Summary (Last 24 hours) at 11/05/14 1418 Last data filed at 11/05/14 0547  Gross per 24 hour  Intake    260 ml  Output    750 ml  Net   -490 ml    Exam:   General:  Pt is alert, NAD  Cardiovascular: Regular rhythm, S1/S2, no rubs, no gallops  Respiratory: Clear to auscultation bilaterally, no wheezing, diminished breath sounds at bases   Abdomen: Soft, non tender, non distended, bowel sounds present, no guarding  Extremities: pulses DP and PT palpable bilaterally   Data Reviewed: Basic Metabolic Panel:  Recent Labs Lab 10/31/14 0922 11/02/14 0002 11/02/14 0443 11/03/14 0520 11/04/14 0510 11/05/14 0520  NA 134 139 137 139 141 139  K 3.7 4.2 3.6 3.4* 3.6 2.9*  CL 104 105 108 107 106 104  CO2 25  --  22 25 26 29   GLUCOSE 107 149* 106* 82 124* 80  BUN 16 27* 21* 15 13 13   CREATININE 1.1 1.20 1.02 1.02 0.97 1.08  CALCIUM 8.5  --  7.7* 8.0* 8.3* 8.3*   Liver Function Tests:  Recent Labs Lab 10/31/14 0922 11/01/14 2351 11/02/14 0443  AST 19 22 15   ALT 18 15* 14*  ALKPHOS 76 78 69  BILITOT 0.70 0.6 0.8  PROT 7.5 7.8 6.6  ALBUMIN 3.0* 3.1* 2.7*    Recent Labs Lab 11/01/14 2351  LIPASE 29   CBC:  Recent Labs Lab 11/01/14 2351 11/02/14 0002 11/02/14 0443 11/03/14 0520 11/04/14 0510 11/05/14 0520  WBC 0.9*  --  0.8* 1.2* 4.0 4.0  NEUTROABS 0.7*  --   --  0.5*  --   --   HGB 7.3* 7.5* 6.4* 7.1* 7.5* 7.4*  HCT 21.6* 22.0* 18.9* 20.9* 23.1* 21.9*  MCV 82.8  --  84.4 85.0 84.3 86.2  PLT 17*  --  13* 32* 35* 50*   CBG:  Recent Labs Lab 11/02/14 0732 11/03/14 0754 11/04/14 0749 11/05/14 0810  GLUCAP 92 93 107* 78   Scheduled Meds: . sodium chloride   Intravenous Once  . sodium chloride   Intravenous Once  . feeding supplement  1 Container Oral  BID BM  . feeding supplement (ENSURE ENLIVE)  237 mL Oral BID BM  . filgrastim  480 mcg Subcutaneous Daily  . lidocaine  1 patch Transdermal Q24H  . OLANZapine  5 mg Oral Daily  . ondansetron (ZOFRAN) IV  4 mg Intravenous Q4H  . pantoprazole (PROTONIX) IV  80 mg Intravenous Q12H  . sodium chloride  3 mL Intravenous Q12H  . sucralfate  1 g Oral TID WC & HS   Continuous Infusions:

## 2014-11-05 NOTE — Progress Notes (Signed)
Patient rested well throughout the night. Pt and RN discussed plan of care and ways to managed nausea based on the MD med orders. Patient received IV Zofran "around the clock" every 4 hour intervals to prevent nausea. Patient received Zofran prior to oral doxcycline and patient did not experinced nausea, only mild reflux during the night. Pt HOB remain elevated @ 30 degree and pt was given gingerale and small bites of saltine crackers. Pt remains settled decrease nausea throughout the night. Pt has request to stay abreast of the nausea with this treatment plan based on the MD order of Zofran. Report will be passed on to day RN. SRP, RN

## 2014-11-05 NOTE — Progress Notes (Signed)
Pt refused to be I/O cath at  This time. Asked to wait. Told pt I will followup and monitor. SRP, RN

## 2014-11-05 NOTE — Progress Notes (Signed)
PT Cancellation Note  Patient Details Name: [name] MRN: [id number] DOB: 05-Mar-1945   Cancelled Treatment:     PT eval attempted x 4 but deferred 2* 1) pt conferring with physician, 2) arrival of breakfast, 3) pt refused stating he was too tired after pain meds, and 4) pt with increasing nausea - RN aware and providing meds.  Will follow and proceed with eval as tolerated by pt.   Semaja Lymon 11/05/2014, 3:39 PM

## 2014-11-06 DIAGNOSIS — D6481 Anemia due to antineoplastic chemotherapy: Secondary | ICD-10-CM

## 2014-11-06 DIAGNOSIS — K1120 Sialoadenitis, unspecified: Secondary | ICD-10-CM

## 2014-11-06 DIAGNOSIS — R001 Bradycardia, unspecified: Secondary | ICD-10-CM

## 2014-11-06 DIAGNOSIS — D4620 Refractory anemia with excess of blasts, unspecified: Secondary | ICD-10-CM

## 2014-11-06 DIAGNOSIS — I2510 Atherosclerotic heart disease of native coronary artery without angina pectoris: Secondary | ICD-10-CM

## 2014-11-06 DIAGNOSIS — K219 Gastro-esophageal reflux disease without esophagitis: Secondary | ICD-10-CM

## 2014-11-06 DIAGNOSIS — R1084 Generalized abdominal pain: Secondary | ICD-10-CM

## 2014-11-06 DIAGNOSIS — J449 Chronic obstructive pulmonary disease, unspecified: Secondary | ICD-10-CM

## 2014-11-06 DIAGNOSIS — J939 Pneumothorax, unspecified: Secondary | ICD-10-CM

## 2014-11-06 DIAGNOSIS — E43 Unspecified severe protein-calorie malnutrition: Secondary | ICD-10-CM

## 2014-11-06 DIAGNOSIS — E876 Hypokalemia: Secondary | ICD-10-CM

## 2014-11-06 DIAGNOSIS — T451X5A Adverse effect of antineoplastic and immunosuppressive drugs, initial encounter: Secondary | ICD-10-CM

## 2014-11-06 DIAGNOSIS — D696 Thrombocytopenia, unspecified: Secondary | ICD-10-CM

## 2014-11-06 LAB — CBC WITH DIFFERENTIAL/PLATELET
BASOS ABS: 0 10*3/uL (ref 0.0–0.1)
BASOS PCT: 0 %
Eosinophils Absolute: 0 10*3/uL (ref 0.0–0.7)
Eosinophils Relative: 0 %
HEMATOCRIT: 22.5 % — AB (ref 39.0–52.0)
HEMOGLOBIN: 7.4 g/dL — AB (ref 13.0–17.0)
Lymphocytes Relative: 20 %
Lymphs Abs: 1 10*3/uL (ref 0.7–4.0)
MCH: 28.6 pg (ref 26.0–34.0)
MCHC: 32.9 g/dL (ref 30.0–36.0)
MCV: 86.9 fL (ref 78.0–100.0)
Monocytes Absolute: 0.3 10*3/uL (ref 0.1–1.0)
Monocytes Relative: 7 %
NEUTROS ABS: 3.7 10*3/uL (ref 1.7–7.7)
NEUTROS PCT: 74 %
Platelets: 44 10*3/uL — ABNORMAL LOW (ref 150–400)
RBC: 2.59 MIL/uL — ABNORMAL LOW (ref 4.22–5.81)
RDW: 14.2 % (ref 11.5–15.5)
WBC: 5 10*3/uL (ref 4.0–10.5)

## 2014-11-06 LAB — BASIC METABOLIC PANEL
ANION GAP: 6 (ref 5–15)
BUN: 20 mg/dL (ref 6–20)
CALCIUM: 8.5 mg/dL — AB (ref 8.9–10.3)
CO2: 28 mmol/L (ref 22–32)
Chloride: 104 mmol/L (ref 101–111)
Creatinine, Ser: 1.15 mg/dL (ref 0.61–1.24)
Glucose, Bld: 92 mg/dL (ref 65–99)
Potassium: 3.2 mmol/L — ABNORMAL LOW (ref 3.5–5.1)
Sodium: 138 mmol/L (ref 135–145)

## 2014-11-06 LAB — PREPARE PLATELET PHERESIS: UNIT DIVISION: 0

## 2014-11-06 LAB — GLUCOSE, CAPILLARY: Glucose-Capillary: 73 mg/dL (ref 65–99)

## 2014-11-06 MED ORDER — POTASSIUM CHLORIDE CRYS ER 20 MEQ PO TBCR
40.0000 meq | EXTENDED_RELEASE_TABLET | Freq: Once | ORAL | Status: AC
  Administered 2014-11-06: 40 meq via ORAL
  Filled 2014-11-06: qty 2

## 2014-11-06 NOTE — Plan of Care (Signed)
Problem: Phase II Progression Outcomes Goal: Progress activity as tolerated unless otherwise ordered Outcome: Completed/Met Date Met:  11/06/14 oob with PT, pt walked 4 west

## 2014-11-06 NOTE — Evaluation (Signed)
Physical Therapy Evaluation Patient Details Name: [name] MRN: [id number] DOB: Nov 26, 1945 Today's Date: 11/06/2014   History of Present Illness  pt was admitted for intractible cyclical vomiting with nausea; he has a PMH of myodysplastic syndrome and has had 2 admissions over the past few months.    Clinical Impression  Patient  Reports  That he feels better. Gait was  Mildly unsteady so encouraged patient to use his RW until improved. Patient  Will benefit from PT to address the problems listed below.     Follow Up Recommendations Home health PT;Supervision/Assistance - 24 hour    Equipment Recommendations  None recommended by PT    Recommendations for Other Services       Precautions / Restrictions Precautions Precautions: Fall Restrictions Weight Bearing Restrictions: No      Mobility  Bed Mobility Overal bed mobility: Modified Independent             General bed mobility comments: HOB raised  Transfers Overall transfer level: Needs assistance Equipment used: None Transfers: Sit to/from Stand Sit to Stand: Min guard         General transfer comment: pt unsteady.  Took a few steps without RW then used it for increased stability  Ambulation/Gait Ambulation/Gait assistance: Museum/gallery curator (Feet): 420 Feet   Gait Pattern/deviations: Step-through pattern;Staggering left;Decreased stride length Gait velocity: decreased Gait velocity interpretation: Below normal speed for age/gender    Stairs            Wheelchair Mobility    Modified Rankin (Stroke Patients Only)       Balance Overall balance assessment: Needs assistance         Standing balance support: No upper extremity supported Standing balance-Leahy Scale: Poor Standing balance comment: listing to the left                             Pertinent Vitals/Pain Pain Assessment: No/denies pain Faces Pain Scale: Hurts a little bit Pain Location: back,  shingles Pain Intervention(s): Premedicated before session    Home Living Family/patient expects to be discharged to:: Private residence Living Arrangements: Spouse/significant other Available Help at Discharge: Family;Available 24 hours/day Type of Home: House Home Access: Stairs to enter Entrance Stairs-Rails: None Entrance Stairs-Number of Steps: 3 Home Layout: One level Home Equipment: Environmental consultant - 2 wheels      Prior Function Level of Independence: Independent         Comments: pt did not need RW prior to this admission     Hand Dominance        Extremity/Trunk Assessment   Upper Extremity Assessment: Defer to OT evaluation           Lower Extremity Assessment: Generalized weakness         Communication   Communication: No difficulties  Cognition Arousal/Alertness: Awake/alert Behavior During Therapy: WFL for tasks assessed/performed Overall Cognitive Status: Within Functional Limits for tasks assessed                      General Comments      Exercises        Assessment/Plan    PT Assessment Patient needs continued PT services  PT Diagnosis Difficulty walking;Generalized weakness   PT Problem List Decreased activity tolerance;Decreased balance;Decreased mobility;Decreased coordination;Decreased knowledge of use of DME;Decreased knowledge of precautions;Pain  PT Treatment Interventions DME instruction;Gait training;Stair training;Functional mobility training;Therapeutic exercise;Therapeutic activities;Balance training;Patient/family education   PT  Goals (Current goals can be found in the Care Plan section) Acute Rehab PT Goals Patient Stated Goal: get strength back; continue to feel better PT Goal Formulation: With patient/family Time For Goal Achievement: 11/20/14 Potential to Achieve Goals: Good    Frequency Min 3X/week   Barriers to discharge        Co-evaluation PT/OT/SLP Co-Evaluation/Treatment: Yes Reason for Co-Treatment:  For patient/therapist safety PT goals addressed during session: Mobility/safety with mobility OT goals addressed during session: ADL's and self-care       End of Session Equipment Utilized During Treatment: Gait belt Activity Tolerance: Patient tolerated treatment well Patient left: in chair;with call bell/phone within reach;with family/visitor present Nurse Communication: Mobility status         Time: 1025-1039 PT Time Calculation (min) (ACUTE ONLY): 14 min   Charges:   PT Evaluation $Initial PT Evaluation Tier I: 1 Procedure     PT G CodesClaretha Cooper 11/06/2014, 12:51 PM Tresa Endo PT [phone]

## 2014-11-06 NOTE — Progress Notes (Signed)
Patient ID: [name], male   DOB: [birth year], 69 y.o.   MRN: [id number] [facility] PROGRESS NOTE  [name] JEH:[id number] DOB: [birth year] DOA: 11/01/2014 PCP: Shirline Frees, MD  Brief narrative:    69 y.o. male with past medical history of MDS, currently on chemotherapy, last dose was two days PTA, history of hypertension, hyperlipidemia, GERD, depression, anxiety, CAD, diverticulitis, hypotestosteronism, DVT, COPD, shingles who presented with nausea, vomiting and periumbilical abdominal pain.  In ED, patient was found to have lactic acid 1.42-->0.40, WBC count of 0.9, hemoglobin 7.3, platelet 17, creatinine 1.2, BUN 27. CT-abd/pelvis showed no acute abnormality to explain the patient's symptoms. X-ray of chest/abd showed unremarkable bowel gas pattern. Pt underwent EGD 10/21 with findings of esophageal ulcer, biopsy results still pending.  Pt additionally found to have sialadenitis and was placed on tapering prednisone.   Anticipated discharge: 10/24.  Assessment/Plan:    Principal Problem: Nausea & vomiting, abd pain  - CT abdomen/pelvis on admission did not show acute abnormalities to explain his symptoms.  - S/P EGD on 10/21, esophageal ulcer noted, biopsies taken and results pending  - Per GI continue protonix and carafate; plan to gastric emptying study - Tolerates regular diet   Active Problems: Antineoplastic chemotherapy induced anemia ([place]) - Has received 1 U PRBC in cancer center, 1 U PRBC 10/19 - Hemoglobin stable at 7.4  Thrombocytopenia - Secondary to chemotherapy - Has received 1 unit of platelets 10/19 and 1 Unit platelets 10/21 - Continue to monitor platelets   Bilateral sialadenitis - Per ENT Dr. Simeon Craft, advised to continue Prednisone with tapering - Pt has received prednisone 20 mg daily and he will get prednisone 10 mg today and then stop today - Completed appropriate course of doxycycline   Hypokalemia - Due to GI losses - Supplemented    Bradycardia - Due to metoprolol - Stopped metoprolol and HR has been WNL so far  MDS (myelodysplastic syndrome), high grade ([place]) - Per oncology team  Pneumothorax, left, recent history / COPD (chronic obstructive pulmonary disease) (HCC) - Stable respiratory status    Acid reflux - Continue PPI therapy   Accelerated hypertension - Stabilized and now 117/66  Coronary atherosclerosis of native coronary artery - Stable - Not on asa due to anemia, thrombocytopenia   Depression - Continue Zyprexa    Protein-calorie malnutrition, severe (HCC) - In the context of chronic illness - Continue nutritional supplementation   DVT Prophylaxis  - SCD's bilaterally   Code Status: Full.  Family Communication:  plan of care discussed with the patient Disposition Plan: Home likely 10/24.  IV access:  Peripheral IV  Procedures and diagnostic studies:    Ct Soft Tissue Neck W Contrast 11/03/2014  1. Inflammatory changes and heterogeneous enhancement of the submandibular glands bilaterally. There is mild intra ductal dilation without evidence for ductal obstruction. This is compatible with bilateral sialoadenitis. 2. Secondary inflammatory changes within subcutaneous soft tissues and mylohyoid muscle adjacent to the submandibular glands. 3. No significant cervical adenopathy. 4. Sub cm of mediastinal nodes. 5. High density peripherally enhancing pleural collection on the left is incompletely imaged. This is likely related to the recent talc pleurodesis. 6. Right-sided Port-A-Cath. Electronically Signed   By: San Morelle M.D.   On: 11/03/2014 12:32   Ct Head Wo Contrast 11/02/2014 Mild atrophy. No acute intracranial abnormality.   Dg Abd Acute W/chest 11/02/2014 Unremarkable bowel gas pattern; no free intra-abdominal air seen. 2. Mild left-sided airspace opacity could reflect a loculated pleural effusion  or mild infection, slightly less prominent than on the recent chest  radiograph. 3. Borderline cardiomegaly.  Medical Consultants:  Oncology  GI  Other Consultants:  None   IAnti-Infectives:   None   Leisa Lenz, MD  [facility] Pager [phone]  Time spent in minutes: 25 minutes  If 7PM-7AM, please contact night-coverage [email or web address] Password [email or web address] 11/06/2014, 12:36 PM   LOS: 4 days    HPI/Subjective: No acute overnight events. Patient reports feeling better.   Objective: Filed Vitals:   11/05/14 0546 11/05/14 1403 11/05/14 2307 11/06/14 0523  BP: 129/67 139/68 122/61 117/66  Pulse: 76 72 69 65  Temp: 98.6 F (37 C) 98.2 F (36.8 C) 98.7 F (37.1 C) 98.8 F (37.1 C)  TempSrc: Oral Oral Oral Oral  Resp: 16 16 16 18   Height:      Weight:    77.5 kg (170 lb 13.7 oz)  SpO2: 96% 97% 98% 97%    Intake/Output Summary (Last 24 hours) at 11/06/14 1236 Last data filed at 11/06/14 1100  Gross per 24 hour  Intake    460 ml  Output   1800 ml  Net  -1340 ml    Exam:   General:  Pt is alert, follows commands appropriately, not in acute distress  Cardiovascular: Regular rate and rhythm, S1/S2 (+)  Respiratory: Clear to auscultation bilaterally, no wheezing, no crackles, no rhonchi  Abdomen: Soft, non tender, non distended, bowel sounds present  Extremities: No edema, pulses DP and PT palpable bilaterally  Neuro: Grossly nonfocal  Data Reviewed: Basic Metabolic Panel:  Recent Labs Lab 11/02/14 0443 11/03/14 0520 11/04/14 0510 11/05/14 0520 11/06/14 0448  NA 137 139 141 139 138  K 3.6 3.4* 3.6 2.9* 3.2*  CL 108 107 106 104 104  CO2 22 25 26 29 28   GLUCOSE 106* 82 124* 80 92  BUN 21* 15 13 13 20   CREATININE 1.02 1.02 0.97 1.08 1.15  CALCIUM 7.7* 8.0* 8.3* 8.3* 8.5*   Liver Function Tests:  Recent Labs Lab 10/31/14 0922 11/01/14 2351 11/02/14 0443  AST 19 22 15   ALT 18 15* 14*  ALKPHOS 76 78 69  BILITOT 0.70 0.6 0.8  PROT 7.5 7.8 6.6  ALBUMIN 3.0* 3.1* 2.7*    Recent Labs Lab  11/01/14 2351  LIPASE 29   No results for input(s): AMMONIA in the last 168 hours. CBC:  Recent Labs Lab 11/01/14 2351  11/02/14 0443 11/03/14 0520 11/04/14 0510 11/05/14 0520 11/06/14 0448  WBC 0.9*  --  0.8* 1.2* 4.0 4.0 5.0  NEUTROABS 0.7*  --   --  0.5*  --   --  3.7  HGB 7.3*  < > 6.4* 7.1* 7.5* 7.4* 7.4*  HCT 21.6*  < > 18.9* 20.9* 23.1* 21.9* 22.5*  MCV 82.8  --  84.4 85.0 84.3 86.2 86.9  PLT 17*  --  13* 32* 35* 50* 44*  < > = values in this interval not displayed. Cardiac Enzymes: No results for input(s): CKTOTAL, CKMB, CKMBINDEX, TROPONINI in the last 168 hours. BNP: Invalid input(s): POCBNP CBG:  Recent Labs Lab 11/02/14 0732 11/03/14 0754 11/04/14 0749 11/05/14 0810 11/06/14 0722  GLUCAP 92 93 107* 78 73    Recent Results (from the past 240 hour(s))  Blood culture (routine x 2)     Status: None (Preliminary result)   Collection Time: 11/01/14 11:51 PM  Result Value Ref Range Status   Specimen Description BLOOD RIGHT ANTECUBITAL  Final   Special Requests BOTTLES DRAWN  AEROBIC AND ANAEROBIC 5ML  Final   Culture   Final    NO GROWTH 4 DAYS Performed at [facility]    Report Status PENDING  Incomplete  Blood culture (routine x 2)     Status: None (Preliminary result)   Collection Time: 11/02/14 12:51 AM  Result Value Ref Range Status   Specimen Description BLOOD RIGHT CHEST  Final   Special Requests BOTTLES DRAWN AEROBIC AND ANAEROBIC 5ML  Final   Culture   Final    NO GROWTH 4 DAYS Performed at [facility]    Report Status PENDING  Incomplete     Scheduled Meds: . sodium chloride   Intravenous Once  . sodium chloride   Intravenous Once  . feeding supplement  1 Container Oral BID BM  . feeding supplement (ENSURE ENLIVE)  237 mL Oral BID BM  . lidocaine  1 patch Transdermal Q24H  . OLANZapine  5 mg Oral Daily  . ondansetron (ZOFRAN) IV  4 mg Intravenous Q4H  . pantoprazole (PROTONIX) IV  80 mg Intravenous Q12H  .  polyethylene glycol  17 g Oral Daily  . senna-docusate  1 tablet Oral BID  . sodium chloride  3 mL Intravenous Q12H  . sucralfate  1 g Oral TID WC & HS   Continuous Infusions:

## 2014-11-06 NOTE — Progress Notes (Signed)
[facility] PROGRESS NOTE Subjective patient feels much better and is having no further nausea or vomiting for unclear reasons. Path is still pending. He and his wife notes that he feels nauseatingly in full frequently.  Objective: Vital signs in last 24 hours: Temp:  [98.2 F (36.8 C)-98.8 F (37.1 C)] 98.8 F (37.1 C) (10/23 0523) Pulse Rate:  [65-72] 65 (10/23 0523) Resp:  [16-18] 18 (10/23 0523) BP: (117-139)/(61-68) 117/66 mmHg (10/23 0523) SpO2:  [97 %-98 %] 97 % (10/23 0523) Weight:  [77.5 kg (170 lb 13.7 oz)] 77.5 kg (170 lb 13.7 oz) (10/23 0523) Last BM Date: 11/01/14  Intake/Output from previous day: 10/22 0701 - 10/23 0700 In: 100 [IV Piggyback:100] Out: 1100 [Urine:1100] Intake/Output this shift:    PE:  Abdomen-- nontender  Lab Results:  Recent Labs  11/04/14 0510 11/05/14 0520 11/06/14 0448  WBC 4.0 4.0 5.0  HGB 7.5* 7.4* 7.4*  HCT 23.1* 21.9* 22.5*  PLT 35* 50* 44*   BMET  Recent Labs  11/04/14 0510 11/05/14 0520 11/06/14 0448  NA 141 139 138  K 3.6 2.9* 3.2*  CL 106 104 104  CO2 26 29 28   CREATININE 0.97 1.08 1.15   LFT No results for input(s): PROT, AST, ALT, ALKPHOS, BILITOT, BILIDIR, IBILI in the last 72 hours. PT/INR No results for input(s): LABPROT, INR in the last 72 hours. PANCREAS No results for input(s): LIPASE in the last 72 hours.       Studies/Results: No results found.  Medications: I have reviewed the patient's current medications.  Assessment/Plan: 1. Nausea and vomiting. This is severe and apparently has been going on for a while. Would wonder if he may have some element of gastroparesis. 2. Esophageal ulceration. This could be malignant but also more likely a result of chronic vomiting.  We will now continue carafate liquid, Protonix, and Zofran. Will order gastric emptying scan. If he has significant delay in gastric emptying I think a trial of metoclopramide may help his symptoms.   Cameryn Chrisley  JR,Ninel Abdella L 11/06/2014, 9:33 AM  Pager: [phone] If no answer or after hours call [phone]

## 2014-11-06 NOTE — Evaluation (Signed)
Occupational Therapy Evaluation Patient Details Name: [name] MRN: [id number] DOB: [birth year] Today's Date: 11/06/2014    History of Present Illness pt was admitted for intractible cyclical vomiting with nausea; he has a PMH of myodysplastic syndrome and has had 2 admissions over the past few months.     Clinical Impression   This 69 year old man was admitted for the above. At baseline, he is independent with adls. Will follow in acute setting with supervision level goals; he currently needs min guard to min A for ADLs/toilet transfers    Follow Up Recommendations  No OT follow up;Supervision/Assistance - 24 hour    Equipment Recommendations   (likely none)    Recommendations for Other Services       Precautions / Restrictions Precautions Precautions: Fall Restrictions Weight Bearing Restrictions: No      Mobility Bed Mobility Overal bed mobility: Modified Independent             General bed mobility comments: HOB raised  Transfers   Equipment used: None Transfers: Sit to/from Stand Sit to Stand: Min guard         General transfer comment: pt unsteady.  Took a few steps without RW then used it for increased stability    Balance                                            ADL Overall ADL's : Needs assistance/impaired                         Toilet Transfer: Minimal assistance;Ambulation;BSC (without AD)             General ADL Comments: Pt needs min guard for balance, sit to stand and set up for ADLs.  He has a sink next to commode.  Rose from comfort height without arms.  Spoke to wife about shower seat vs. sponge bathing until pt gets strength and balance back.  She will assist with sponge bathing.     Vision     Perception     Praxis      Pertinent Vitals/Pain Pain Assessment: No/denies pain     Hand Dominance     Extremity/Trunk Assessment Upper Extremity Assessment Upper Extremity Assessment:  Overall WFL for tasks assessed           Communication Communication Communication: No difficulties   Cognition Arousal/Alertness: Awake/alert Behavior During Therapy: WFL for tasks assessed/performed Overall Cognitive Status: Within Functional Limits for tasks assessed                     General Comments       Exercises       Shoulder Instructions      Home Living Family/patient expects to be discharged to:: Private residence Living Arrangements: Spouse/significant other Available Help at Discharge: Family;Available 24 hours/day               Bathroom Shower/Tub: Occupational psychologist: Standard     Home Equipment: Environmental consultant - 2 wheels          Prior Functioning/Environment Level of Independence: Independent        Comments: pt did not need RW prior to this admission    OT Diagnosis: Generalized weakness   OT Problem List: Decreased strength;Impaired balance (sitting and/or standing)   OT  Treatment/Interventions: Self-care/ADL training;DME and/or AE instruction;Patient/family education;Balance training    OT Goals(Current goals can be found in the care plan section) Acute Rehab OT Goals Patient Stated Goal: get strength back; continue to feel better OT Goal Formulation: With patient Time For Goal Achievement: 11/13/14 Potential to Achieve Goals: Good ADL Goals Pt Will Perform Grooming: with supervision;standing Pt Will Transfer to Toilet: with supervision;ambulating;regular height toilet Pt Will Perform Toileting - Clothing Manipulation and hygiene: with supervision;sit to/from stand  OT Frequency: Min 2X/week   Barriers to D/C:            Co-evaluation PT/OT/SLP Co-Evaluation/Treatment: Yes   PT goals addressed during session: Mobility/safety with mobility OT goals addressed during session: ADL's and self-care      End of Session Nurse Communication:  (alarm not under pt:  wife in room)  Activity Tolerance: Patient  tolerated treatment well Patient left: in chair;with call bell/phone within reach   Time: 4008-6761 OT Time Calculation (min): 14 min Charges:  OT General Charges $OT Visit: 1 Procedure OT Evaluation $Initial OT Evaluation Tier I: 1 Procedure G-Codes:    [name] 15-Nov-2014, 11:29 AM   Lesle Chris, OTR/L [phone] 11/15/2014

## 2014-11-07 ENCOUNTER — Ambulatory Visit: Payer: Medicare Other

## 2014-11-07 ENCOUNTER — Ambulatory Visit: Payer: Medicare Other | Admitting: Family

## 2014-11-07 ENCOUNTER — Inpatient Hospital Stay (HOSPITAL_COMMUNITY): Payer: Medicare Other

## 2014-11-07 ENCOUNTER — Other Ambulatory Visit: Payer: Medicare Other

## 2014-11-07 ENCOUNTER — Encounter (HOSPITAL_COMMUNITY): Payer: Self-pay | Admitting: Gastroenterology

## 2014-11-07 DIAGNOSIS — J9383 Other pneumothorax: Secondary | ICD-10-CM

## 2014-11-07 DIAGNOSIS — R112 Nausea with vomiting, unspecified: Secondary | ICD-10-CM

## 2014-11-07 DIAGNOSIS — R1110 Vomiting, unspecified: Secondary | ICD-10-CM | POA: Insufficient documentation

## 2014-11-07 DIAGNOSIS — D696 Thrombocytopenia, unspecified: Secondary | ICD-10-CM | POA: Insufficient documentation

## 2014-11-07 DIAGNOSIS — I10 Essential (primary) hypertension: Secondary | ICD-10-CM

## 2014-11-07 LAB — CULTURE, BLOOD (ROUTINE X 2)
Culture: NO GROWTH
Culture: NO GROWTH

## 2014-11-07 LAB — GLUCOSE, CAPILLARY: Glucose-Capillary: 91 mg/dL (ref 65–99)

## 2014-11-07 MED ORDER — TRAMADOL HCL 50 MG PO TABS: 50.0000 mg | ORAL_TABLET | Freq: Four times a day (QID) | ORAL | Status: DC | PRN

## 2014-11-07 MED ORDER — TECHNETIUM TC 99M SULFUR COLLOID
2.2000 | Freq: Once | INTRAVENOUS | Status: DC | PRN
  Administered 2014-11-07: 2.2 via INTRAVENOUS
  Filled 2014-11-07: qty 2.2

## 2014-11-07 MED ORDER — ONDANSETRON 4 MG PO TBDP: 4.0000 mg | ORAL_TABLET | Freq: Three times a day (TID) | ORAL | Status: DC | PRN

## 2014-11-07 NOTE — Care Management Important Message (Signed)
Important Message  Patient Details  Name: JOVAUGHN WOJTASZEK MRN: [id number] Date of Birth: [birth year]   Medicare Important Message Given:  Yes-third notification given    Camillo Flaming 11/07/2014, 12:25 [place] Message  Patient Details  Name: VALERIO PINARD MRN: [id number] Date of Birth: [birth year]   Medicare Important Message Given:  Yes-third notification given    Camillo Flaming 11/07/2014, 12:25 PM

## 2014-11-07 NOTE — Progress Notes (Signed)
OT Cancellation Note  Patient Details Name: [name] MRN: [id number] DOB: [birth year]   Cancelled Treatment:    Reason Eval/Treat Not Completed: Patient at procedure or test/ unavailable  Will recheck on pt later in day or next day.  Mickel Baas [place], [place] 11/07/2014, 11:16 AM

## 2014-11-07 NOTE — Progress Notes (Signed)
Nutrition Follow-up  DOCUMENTATION CODES:   Severe malnutrition in context of acute illness/injury  INTERVENTION:  - Continue Ensure Enlive BID - Will d/c Boost Breeze - RD will continue to monitor for needs  NUTRITION DIAGNOSIS:   Increased nutrient needs related to catabolic illness, cancer and cancer related treatments as evidenced by estimated needs. -ongoing  GOAL:   Patient will meet greater than or equal to 90% of their needs -variably met  MONITOR:   PO intake, Supplement acceptance, Weight trends, Labs, Skin, I & O's  ASSESSMENT:   69 y.o. male with PMH of MDS on chemotherapy currently, hypertension, hyperlipidemia, GERD, depression, anxiety, CAD (as/PE of stent placement), diverticulitis, hypotestosteronism, DVT, COPD, shingles, who presented with nausea, vomiting and abdominal pain.  10/24 Per chart review, pt ate 100% lunch and dinner yesterday. Wife reports that 10/22 for dinner pt had [place] with stuffing and mashed potatoes. She states last night a family member brought mashed potatoes and chicken pot pie from [employer] for pt and that he ate most of this. Pt reports heightened sense of smell and taste while on chemo (gave the example of being able to smell ingredients such as flour in foods). He states that the past few days his appetite has greatly improved and that he has been drinking Ensure and feels his strength is beginning to return. He and wife state no BM since last Tuesday (10/18) although pt being given Miralax and stool softeners.   Gastric emptying study done this AM and pending results.  Beginning to meet needs. Medications reviewed. Labs reviewed; CBGs: 73-107 mg/dL, K: 3.2 mmol/L, Ca: 8.5 mg/dL.    10/19 - Pt currently on CLD but did not have breakfast this AM; ordered apple juice and cherry Jello for pt at time of visit per his preference.  - Yesterday pt had severe nausea and ongoing vomiting x5 hours.  - Prior to yesterday pt had issues with the  same which wife relates to reflux.  - She states pt would have nausea and vomiting due to reflux even without PO intake. Because of this, pt was eating less the past few weeks and was no experiencing hunger.  - She states pt has lost 40 lbs in the past 7 weeks.  - Per chart review, pt has lost 13 lbs (7% body weight) in the past 1 month which is significant for time frame. - Pt was drinking chocolate Ensure at home but wife had recently purchased strawberry after she was informed that chocolate can exacerbate reflux. Pt interested in having Ensure during admission and also willing to try Boost Breeze while on CLD.    Diet Order:  Diet regular Room service appropriate?: Yes; Fluid consistency:: Thin  Skin:  Wound (see comment) (L chest incision (10/11/14))  Last BM:  10/18  Height:   Ht Readings from Last 1 Encounters:  11/04/14 $RemoveB'5\' 10"'[id number]$  (1.778 m)    Weight:   Wt Readings from Last 1 Encounters:  11/06/14 170 lb 13.7 oz (77.5 kg)    Ideal Body Weight:  75.45 kg (kg)  BMI:  Body mass index is 24.52 kg/(m^2).  Estimated Nutritional Needs:   Kcal:  4098-1191  Protein:  80-90 grams  Fluid:  2-2.2 L/day  EDUCATION NEEDS:   No education needs identified at this time     Jarome Matin, RD, LDN Inpatient Clinical Dietitian Pager # [phone] After hours/weekend pager # [phone]

## 2014-11-07 NOTE — Progress Notes (Addendum)
Patient ID: SAFWAN TOMEI, male   DOB: [birth year], 69 y.o.   MRN: [id number] [facility] PROGRESS NOTE  TRAYVEON BECKFORD JOA:[id number] DOB: 1945/08/26 DOA: 11/01/2014 PCP: Shirline Frees, MD  Brief narrative:    69 y.o. male with past medical history of MDS, currently on chemotherapy, last dose was two days PTA, history of hypertension, hyperlipidemia, GERD, depression, anxiety, CAD, diverticulitis, hypotestosteronism, DVT, COPD, shingles who presented with nausea, vomiting and periumbilical abdominal pain.  In ED, patient was found to have lactic acid 1.42-->0.40, WBC count of 0.9, hemoglobin 7.3, platelet 17, creatinine 1.2, BUN 27. CT-abd/pelvis showed no acute abnormality to explain the patient's symptoms. X-ray of chest/abd showed unremarkable bowel gas pattern. Pt underwent EGD 10/21 with findings of esophageal ulcer, biopsy results still pending.  Pt additionally found to have sialadenitis and was placed on tapering prednisone.   Anticipated discharge: Gastric emptying planned for today .  Assessment/Plan:    Principal Problem: Nausea & vomiting, abd pain  - CT abdomen/pelvis on admission demonstrated no acute abnormalities to explain the symptoms.  - Pt S/P EGD on 10/21, esophageal ulcer noted, biopsies taken and results pending  - Continue protonix and carafate - Per GI, plan is for gastric emptying study today  - Tolerates regular diet   Active Problems: Antineoplastic chemotherapy induced anemia (HCC) - Pt has received 1 U PRBC in cancer center, 1 U PRBC 10/19 - Hemoglobin remains stable at 7.4  Thrombocytopenia - Secondary to sequela of chemotherapy  - Pt has received 1 unit of platelets 10/19 and 1 Unit platelets 10/21  Bilateral sialadenitis - Per ENT Dr. Simeon Craft, advised to continue Prednisone with tapering - Pt has received prednisone, stopped / tapered off 10/24. - Completed appropriate course of doxycycline   Hypokalemia - Due to GI losses - Supplemented    Bradycardia - Resolved with stopping metoprolol   MDS (myelodysplastic syndrome), high grade (HCC) - Per oncology   Pneumothorax, left, recent history / COPD (chronic obstructive pulmonary disease) (HCC) - Stable    Acid reflux - Continue protonix 80 mg IV Q 12 hours and Zofran 4 mg IV Q 6 hours     Accelerated hypertension - BP now stable, 121/60  Coronary atherosclerosis of native coronary artery - Not on asa due to anemia, thrombocytopenia   Depression - Stable  - Continue Zyprexa    Protein-calorie malnutrition, severe (HCC) - In the context of chronic illness - Continue nutritional supplementation   DVT Prophylaxis  - SCD's bilaterally in hospital   Code Status: Full.  Family Communication:  plan of care discussed with the patient Disposition Plan: Gastric emptying study today   IV access:  Peripheral IV  Procedures and diagnostic studies:    Ct Soft Tissue Neck W Contrast 11/03/2014  1. Inflammatory changes and heterogeneous enhancement of the submandibular glands bilaterally. There is mild intra ductal dilation without evidence for ductal obstruction. This is compatible with bilateral sialoadenitis. 2. Secondary inflammatory changes within subcutaneous soft tissues and mylohyoid muscle adjacent to the submandibular glands. 3. No significant cervical adenopathy. 4. Sub cm of mediastinal nodes. 5. High density peripherally enhancing pleural collection on the left is incompletely imaged. This is likely related to the recent talc pleurodesis. 6. Right-sided Port-A-Cath. Electronically Signed   By: San Morelle M.D.   On: 11/03/2014 12:32   Ct Head Wo Contrast 11/02/2014 Mild atrophy. No acute intracranial abnormality.   Dg Abd Acute W/chest 11/02/2014 Unremarkable bowel gas pattern; no free intra-abdominal air seen. 2. Mild  left-sided airspace opacity could reflect a loculated pleural effusion or mild infection, slightly less prominent than on the recent  chest radiograph. 3. Borderline cardiomegaly.  Medical Consultants:  Oncology  GI  Other Consultants:  None   IAnti-Infectives:   None   Leisa Lenz, MD  [facility] Pager [phone]  Time spent in minutes: 15 minutes  If 7PM-7AM, please contact night-coverage [email or web address] Password [email or web address] 11/07/2014, 12:40 PM   LOS: 5 days    HPI/Subjective: No acute overnight events. Patient reports mild nausea but no vomiting.   Objective: Filed Vitals:   11/06/14 0523 11/06/14 1450 11/06/14 2138 11/07/14 0557  BP: 117/66 125/60 118/59 121/60  Pulse: 65 76 70 85  Temp: 98.8 F (37.1 C) 97.9 F (36.6 C) 98.4 F (36.9 C) 99.2 F (37.3 C)  TempSrc: Oral Oral Oral Oral  Resp: 18 18 18 18   Height:      Weight: 77.5 kg (170 lb 13.7 oz)     SpO2: 97% 98% 99% 94%    Intake/Output Summary (Last 24 hours) at 11/07/14 1240 Last data filed at 11/06/14 2217  Gross per 24 hour  Intake    360 ml  Output   1000 ml  Net   -640 ml    Exam:   General:  Pt is alert, not in acute distress  Cardiovascular: RRR, (+) S1, S2   Respiratory: No wheezing, no crackles, no rhonchi  Abdomen: (+) BS, non tender   Extremities: No leg swelling, pulses palpable  Neuro: Nonfocal  Data Reviewed: Basic Metabolic Panel:  Recent Labs Lab 11/02/14 0443 11/03/14 0520 11/04/14 0510 11/05/14 0520 11/06/14 0448  NA 137 139 141 139 138  K 3.6 3.4* 3.6 2.9* 3.2*  CL 108 107 106 104 104  CO2 22 25 26 29 28   GLUCOSE 106* 82 124* 80 92  BUN 21* 15 13 13 20   CREATININE 1.02 1.02 0.97 1.08 1.15  CALCIUM 7.7* 8.0* 8.3* 8.3* 8.5*   Liver Function Tests:  Recent Labs Lab 11/01/14 2351 11/02/14 0443  AST 22 15  ALT 15* 14*  ALKPHOS 78 69  BILITOT 0.6 0.8  PROT 7.8 6.6  ALBUMIN 3.1* 2.7*    Recent Labs Lab 11/01/14 2351  LIPASE 29   No results for input(s): AMMONIA in the last 168 hours. CBC:  Recent Labs Lab 11/01/14 2351  11/02/14 0443 11/03/14 0520  11/04/14 0510 11/05/14 0520 11/06/14 0448  WBC 0.9*  --  0.8* 1.2* 4.0 4.0 5.0  NEUTROABS 0.7*  --   --  0.5*  --   --  3.7  HGB 7.3*  < > 6.4* 7.1* 7.5* 7.4* 7.4*  HCT 21.6*  < > 18.9* 20.9* 23.1* 21.9* 22.5*  MCV 82.8  --  84.4 85.0 84.3 86.2 86.9  PLT 17*  --  13* 32* 35* 50* 44*  < > = values in this interval not displayed. Cardiac Enzymes: No results for input(s): CKTOTAL, CKMB, CKMBINDEX, TROPONINI in the last 168 hours. BNP: Invalid input(s): POCBNP CBG:  Recent Labs Lab 11/03/14 0754 11/04/14 0749 11/05/14 0810 11/06/14 0722 11/07/14 0758  GLUCAP 93 107* 78 73 91    Recent Results (from the past 240 hour(s))  Blood culture (routine x 2)     Status: None (Preliminary result)   Collection Time: 11/01/14 11:51 PM  Result Value Ref Range Status   Specimen Description BLOOD RIGHT ANTECUBITAL  Final   Special Requests BOTTLES DRAWN AEROBIC AND ANAEROBIC 5ML  Final   Culture  Final    NO GROWTH 4 DAYS Performed at [facility]    Report Status PENDING  Incomplete  Blood culture (routine x 2)     Status: None (Preliminary result)   Collection Time: 11/02/14 12:51 AM  Result Value Ref Range Status   Specimen Description BLOOD RIGHT CHEST  Final   Special Requests BOTTLES DRAWN AEROBIC AND ANAEROBIC 5ML  Final   Culture   Final    NO GROWTH 4 DAYS Performed at [facility]    Report Status PENDING  Incomplete     Scheduled Meds: . sodium chloride   Intravenous Once  . sodium chloride   Intravenous Once  . feeding supplement  1 Container Oral BID BM  . feeding supplement (ENSURE ENLIVE)  237 mL Oral BID BM  . lidocaine  1 patch Transdermal Q24H  . OLANZapine  5 mg Oral Daily  . ondansetron (ZOFRAN) IV  4 mg Intravenous Q4H  . pantoprazole (PROTONIX) IV  80 mg Intravenous Q12H  . polyethylene glycol  17 g Oral Daily  . senna-docusate  1 tablet Oral BID  . sodium chloride  3 mL Intravenous Q12H  . sucralfate  1 g Oral TID WC & HS   Continuous  Infusions:

## 2014-11-08 ENCOUNTER — Ambulatory Visit: Payer: Medicare Other

## 2014-11-08 DIAGNOSIS — K2970 Gastritis, unspecified, without bleeding: Secondary | ICD-10-CM

## 2014-11-08 LAB — GLUCOSE, CAPILLARY: GLUCOSE-CAPILLARY: 105 mg/dL — AB (ref 65–99)

## 2014-11-08 MED ORDER — PANTOPRAZOLE SODIUM 40 MG PO TBEC: 20.0000 mg | DELAYED_RELEASE_TABLET | Freq: Two times a day (BID) | ORAL | Status: DC

## 2014-11-08 MED ORDER — SUCRALFATE 1 GM/10ML PO SUSP: 1.0000 g | Freq: Three times a day (TID) | ORAL | Status: AC

## 2014-11-08 MED ORDER — HEPARIN SOD (PORK) LOCK FLUSH 100 UNIT/ML IV SOLN
500.0000 [IU] | INTRAVENOUS | Status: AC | PRN
  Administered 2014-11-08: 500 [IU]

## 2014-11-08 MED ORDER — SENNOSIDES-DOCUSATE SODIUM 8.6-50 MG PO TABS: 1.0000 | ORAL_TABLET | Freq: Every day | ORAL | Status: DC

## 2014-11-08 MED ORDER — POLYETHYLENE GLYCOL 3350 17 G PO PACK: 17.0000 g | PACK | Freq: Every day | ORAL | Status: DC

## 2014-11-08 MED ORDER — POTASSIUM CHLORIDE CRYS ER 20 MEQ PO TBCR: 20.0000 meq | EXTENDED_RELEASE_TABLET | Freq: Every day | ORAL | Status: DC

## 2014-11-08 MED ORDER — OLANZAPINE 5 MG PO TABS: 5.0000 mg | ORAL_TABLET | Freq: Every day | ORAL | Status: DC

## 2014-11-08 NOTE — Discharge Instructions (Signed)
Gastritis, Adult Gastritis is soreness and swelling (inflammation) of the lining of the stomach. Gastritis can develop as a sudden onset (acute) or long-term (chronic) condition. If gastritis is not treated, it can lead to stomach bleeding and ulcers. CAUSES  Gastritis occurs when the stomach lining is weak or damaged. Digestive juices from the stomach then inflame the weakened stomach lining. The stomach lining may be weak or damaged due to viral or bacterial infections. One common bacterial infection is the Helicobacter pylori infection. Gastritis can also result from excessive alcohol consumption, taking certain medicines, or having too much acid in the stomach.  SYMPTOMS  In some cases, there are no symptoms. When symptoms are present, they may include:  Pain or a burning sensation in the upper abdomen.  Nausea.  Vomiting.  An uncomfortable feeling of fullness after eating. DIAGNOSIS  Your caregiver may suspect you have gastritis based on your symptoms and a physical exam. To determine the cause of your gastritis, your caregiver may perform the following:  Blood or stool tests to check for the H pylori bacterium.  Gastroscopy. A thin, flexible tube (endoscope) is passed down the esophagus and into the stomach. The endoscope has a light and camera on the end. Your caregiver uses the endoscope to view the inside of the stomach.  Taking a tissue sample (biopsy) from the stomach to examine under a microscope. TREATMENT  Depending on the cause of your gastritis, medicines may be prescribed. If you have a bacterial infection, such as an H pylori infection, antibiotics may be given. If your gastritis is caused by too much acid in the stomach, H2 blockers or antacids may be given. Your caregiver may recommend that you stop taking aspirin, ibuprofen, or other nonsteroidal anti-inflammatory drugs (NSAIDs). HOME CARE INSTRUCTIONS  Only take over-the-counter or prescription medicines as directed by  your caregiver.  If you were given antibiotic medicines, take them as directed. Finish them even if you start to feel better.  Drink enough fluids to keep your urine clear or pale yellow.  Avoid foods and drinks that make your symptoms worse, such as:  Caffeine or alcoholic drinks.  Chocolate.  Peppermint or mint flavorings.  Garlic and onions.  Spicy foods.  Citrus fruits, such as oranges, lemons, or limes.  Tomato-based foods such as sauce, chili, salsa, and pizza.  Fried and fatty foods.  Eat small, frequent meals instead of large meals. SEEK IMMEDIATE MEDICAL CARE IF:   You have black or dark red stools.  You vomit blood or material that looks like coffee grounds.  You are unable to keep fluids down.  Your abdominal pain gets worse.  You have a fever.  You do not feel better after 1 week.  You have any other questions or concerns. MAKE SURE YOU:  Understand these instructions.  Will watch your condition.  Will get help right away if you are not doing well or get worse.   This information is not intended to replace advice given to you by your health care provider. Make sure you discuss any questions you have with your health care provider.   Document Released: 12/25/2000 Document Revised: 07/02/2011 Document Reviewed: 02/13/2011 Elsevier Interactive Patient Education 2016 [employer] Pantoprazole tablets What is this medicine? PANTOPRAZOLE (pan TOE pra zole) prevents the production of acid in the stomach. It is used to treat gastroesophageal reflux disease (GERD), inflammation of the esophagus, and Zollinger-Ellison syndrome. This medicine may be used for other purposes; ask your health care provider or pharmacist  if you have questions. What should I tell my health care provider before I take this medicine? They need to know if you have any of these conditions: -liver disease -low levels of magnesium in the blood -an unusual or allergic reaction to  omeprazole, lansoprazole, pantoprazole, rabeprazole, other medicines, foods, dyes, or preservatives -pregnant or trying to get pregnant -breast-feeding How should I use this medicine? Take this medicine by mouth. Swallow the tablets whole with a drink of water. Follow the directions on the prescription label. Do not crush, break, or chew. Take your medicine at regular intervals. Do not take your medicine more often than directed. Talk to your pediatrician regarding the use of this medicine in children. While this drug may be prescribed for children as young as 5 years for selected conditions, precautions do apply. Overdosage: If you think you have taken too much of this medicine contact a poison control center or emergency room at once. NOTE: This medicine is only for you. Do not share this medicine with others. What if I miss a dose? If you miss a dose, take it as soon as you can. If it is almost time for your next dose, take only that dose. Do not take double or extra doses. What may interact with this medicine? Do not take this medicine with any of the following medications: -atazanavir -nelfinavir This medicine may also interact with the following medications: -ampicillin -delavirdine -erlotinib -iron salts -medicines for fungal infections like ketoconazole, itraconazole and voriconazole -methotrexate -mycophenolate mofetil -warfarin This list may not describe all possible interactions. Give your health care provider a list of all the medicines, herbs, non-prescription drugs, or dietary supplements you use. Also tell them if you smoke, drink alcohol, or use illegal drugs. Some items may interact with your medicine. What should I watch for while using this medicine? It can take several days before your stomach pain gets better. Check with your doctor or health care professional if your condition does not start to get better, or if it gets worse. You may need blood work done while you are  taking this medicine. What side effects may I notice from receiving this medicine? Side effects that you should report to your doctor or health care professional as soon as possible: -allergic reactions like skin rash, itching or hives, swelling of the face, lips, or tongue -bone, muscle or joint pain -breathing problems -chest pain or chest tightness -dark yellow or brown urine -dizziness -fast, irregular heartbeat -feeling faint or lightheaded -fever or sore throat -muscle spasm -palpitations -redness, blistering, peeling or loosening of the skin, including inside the mouth -seizures -tremors -unusual bleeding or bruising -unusually weak or tired -yellowing of the eyes or skin Side effects that usually do not require medical attention (Report these to your doctor or health care professional if they continue or are bothersome.): -constipation -diarrhea -dry mouth -headache -nausea This list may not describe all possible side effects. Call your doctor for medical advice about side effects. You may report side effects to FDA at 1-800-FDA-1088. Where should I keep my medicine? Keep out of the reach of children. Store at room temperature between 15 and 30 degrees C (59 and 86 degrees F). Protect from light and moisture. Throw away any unused medicine after the expiration date. NOTE: This sheet is a summary. It may not cover all possible information. If you have questions about this medicine, talk to your doctor, pharmacist, or health care provider.    2016, Elsevier/Gold Standard. (2014-02-18 14:45:56) Sucralfate  tablets What is this medicine? SUCRALFATE (SOO kral fate) helps to treat ulcers of the intestine. This medicine may be used for other purposes; ask your health care provider or pharmacist if you have questions. What should I tell my health care provider before I take this medicine? They need to know if you have any of these conditions: -kidney disease -an unusual or  allergic reaction to sucralfate, other medicines, foods, dyes, or preservatives -pregnant or trying to get pregnant -breast-feeding How should I use this medicine? Take this medicine by mouth with a glass of water. Follow the directions on the prescription label. This medicine works best if you take it on an empty stomach, 1 hour before meals. Take your doses at regular intervals. Do not take your medicine more often than directed. Do not stop taking except on your doctor's advice. Talk to your pediatrician regarding the use of this medicine in children. Special care may be needed. Overdosage: If you think you have taken too much of this medicine contact a poison control center or emergency room at once. NOTE: This medicine is only for you. Do not share this medicine with others. What if I miss a dose? If you miss a dose, take it as soon as you can. If it is almost time for your next dose, take only that dose. Do not take double or extra doses. What may interact with this medicine? -antacid -cimetidine -digoxin -ketoconazole -phenytoin -quinidine -ranitidine -some antibiotics like ciprofloxacin, norfloxacin, and ofloxacin -theophylline -thyroid hormones -warfarin This list may not describe all possible interactions. Give your health care provider a list of all the medicines, herbs, non-prescription drugs, or dietary supplements you use. Also tell them if you smoke, drink alcohol, or use illegal drugs. Some items may interact with your medicine. What should I watch for while using this medicine? Visit your doctor or health care professional for regular check ups. Let your doctor know if your symptoms do not improve or if you feel worse. Antacids should not be taken within one half hour before or after this medicine. What side effects may I notice from receiving this medicine? Side effects that you should report to your doctor or health care professional as soon as possible: -allergic  reactions like skin rash, itching or hives, swelling of the face, lips, or tongue -difficulty breathing Side effects that usually do not require medical attention (report to your doctor or health care professional if they continue or are bothersome): -back pain -constipation -drowsy, dizzy -dry mouth -headache -stomach upset, gas -trouble sleeping This list may not describe all possible side effects. Call your doctor for medical advice about side effects. You may report side effects to FDA at 1-800-FDA-1088. Where should I keep my medicine? Keep out of the reach of children. Store at room temperature between 15 and 30 degrees C (59 and 86 degrees F). Keep container tightly closed. Throw away any unused medicine after the expiration date. NOTE: This sheet is a summary. It may not cover all possible information. If you have questions about this medicine, talk to your doctor, pharmacist, or health care provider.    2016, Elsevier/Gold Standard. (2007-09-02 15:46:20)

## 2014-11-08 NOTE — Progress Notes (Signed)
Patient ID: [name], male   DOB: Nov 06, 1945, 69 y.o.   MRN: [id number] [facility] Progress Note  [name] 69 y.o. 1945-08-01   Subjective: Feels ok. Tolerating solid food without nausea and vomiting. No more dry heaves. Complains of pain at area of his shingles but otherwise denies abdominal pain.  Objective: Vital signs in last 24 hours: Filed Vitals:   11/08/14 0538  BP: 107/53  Pulse: 84  Temp: 98.7 F (37.1 C)  Resp: 18    Physical Exam: Gen: alert, no acute distress HEENT: anicteric CV: RRR Chest: CTA B Abd: soft, nontender, nondistended, +BS Skin: vesicular rash on right side of abdomen in dermatomal pattern  Lab Results:  Recent Labs  11/06/14 0448  NA 138  K 3.2*  CL 104  CO2 28  GLUCOSE 92  BUN 20  CREATININE 1.15  CALCIUM 8.5*   No results for input(s): AST, ALT, ALKPHOS, BILITOT, PROT, ALBUMIN in the last 72 hours.  Recent Labs  11/06/14 0448  WBC 5.0  NEUTROABS 3.7  HGB 7.4*  HCT 22.5*  MCV 86.9  PLT 44*   No results for input(s): LABPROT, INR in the last 72 hours.    Assessment/Plan: Recurrent N/V resolved - likely multifactorial from chemo and also esophageal ulcer with GERD. EGD Biopsies show benign inflammation without infection although additional staining by pathology to be done (f/u as outpt). Suspect acid reflux source of ulcer and needs PPI PO BID when discharged. Ok to go home from a GI standpoint. F/U with Dr. Oletta Lamas in 3-4 weeks.   Linda C. 11/08/2014, 8:41 AM  Pager [phone]  If no answer or after 5 PM call [phone]

## 2014-11-08 NOTE — Progress Notes (Signed)
Spoke with pt concerning home health. Pt states that he will not need HHPT/RN at present time.

## 2014-11-08 NOTE — Plan of Care (Signed)
Problem: Phase II Progression Outcomes Goal: IV changed to normal saline lock Outcome: Completed/Met Date Met:  11/08/14 Port accessed awaiting IV rn

## 2014-11-08 NOTE — Discharge Summary (Signed)
Physician Discharge Summary  [name] WVP:[id number] DOB: 03-13-1945 DOA: 11/01/2014  PCP: Shirline Frees, MD  Admit date: 11/01/2014 Discharge date: 11/08/2014  Recommendations for Outpatient Follow-up:  Endoscopy biopsy result showed gastritis. Gastric emptying study is normal. Continue sucralfate and protonix as prescribed. Hold aspirin due to risk of bleed.  Discharge Diagnoses:  Principal Problem:   Intractable cyclical vomiting with nausea Active Problems:   Antineoplastic chemotherapy induced anemia   Essential hypertension   Coronary atherosclerosis of native coronary artery   Pancytopenia (HCC)   MDS (myelodysplastic syndrome), high grade (HCC)   COPD (chronic obstructive pulmonary disease) (HCC)   Pneumothorax, left   Acid reflux   Abdominal pain   Protein-calorie malnutrition, severe   Uncontrollable vomiting   Neck swelling   Emesis   Thrombocytopenia ([place])    Discharge Condition: stable   Diet recommendation: as tolerated   History of present illness:  69 y.o. male with past medical history of MDS, currently on chemotherapy, last dose was two days PTA, history of hypertension, hyperlipidemia, GERD, depression, anxiety, CAD, diverticulitis, hypotestosteronism, DVT, COPD, shingles who presented with nausea, vomiting and periumbilical abdominal pain.  In ED, patient was found to have lactic acid 1.42-->0.40, WBC count of 0.9, hemoglobin 7.3, platelet 17, creatinine 1.2, BUN 27. CT-abd/pelvis showed no acute abnormality to explain the patient's symptoms. X-ray of chest/abd showed unremarkable bowel gas pattern. Pt underwent EGD 10/21 with findings of esophageal ulcer, biopsy results showed gastritis but no H.Pylori.  Pt additionally found to have sialadenitis and was placed on tapering prednisone.   Has had gastric emptying study which was normal.   Hospital Course:   Assessment/Plan:    Principal Problem: Nausea & vomiting, abd pain / GERD /  Gastritis  - CT abdomen/pelvis on admission demonstrated no acute abnormalities to explain the symptoms.  - EGD done on 10/21 showed esophageal ulcer, biopsies taken and results demonstrated gastritis but no H.Pylori  - GES is normal  - Continue protonix and Carafate on discharge  - Tolerates regular diet   Active Problems: Antineoplastic chemotherapy induced anemia (HCC) - During this hospital stay pt received 1 U PRBC in cancer center, 1 U PRBC 10/19 - Hemoglobin stable at 7.4 - Advised to avoid aspirin on D/C due to risk of bleed   Thrombocytopenia - Secondary to malignancy and sequela of chemotherapy  - Has received 1 unit of platelets 10/19 and 1 Unit on 10/21 - Platelets 44  Bilateral sialadenitis - Per ENT Dr. Simeon Craft, advised to continue Prednisone with tapering - Pt has received prednisone, stopped / tapered off 10/24. - Completed appropriate course of doxycycline   Hypokalemia - Secondary to GI losses - Supplemented and will continue to supplement daily on discharge low dose 20 meq daily.   Bradycardia - Resolved with stopping metoprolol. HR now in 80's and good blood pressure so low dose metoprolol may be resumed on discharge.   MDS (myelodysplastic syndrome), high grade (HCC) - Management per oncology   Pneumothorax, left, recent history / COPD (chronic obstructive pulmonary disease) (HCC) - Stablerespiratory status   Essential hypertension - Resume metoprolol on discharge   Coronary atherosclerosis of native coronary artery - Not on asa due to anemia, thrombocytopenia   Depression - Stable  - Continue Zyprexa    Protein-calorie malnutrition, severe (HCC) - In the context of chronic illness - Seen by dietician during this hospital stay   DVT Prophylaxis  - SCD's bilaterally in hospital   Code Status: Full.  Family Communication:  plan of care discussed with the patient   IV access:  Peripheral IV  Procedures and diagnostic studies:    Ct Soft Tissue Neck W Contrast 11/03/2014 1. Inflammatory changes and heterogeneous enhancement of the submandibular glands bilaterally. There is mild intra ductal dilation without evidence for ductal obstruction. This is compatible with bilateral sialoadenitis. 2. Secondary inflammatory changes within subcutaneous soft tissues and mylohyoid muscle adjacent to the submandibular glands. 3. No significant cervical adenopathy. 4. Sub cm of mediastinal nodes. 5. High density peripherally enhancing pleural collection on the left is incompletely imaged. This is likely related to the recent talc pleurodesis. 6. Right-sided Port-A-Cath. Electronically Signed By: San Morelle M.D. On: 11/03/2014 12:32   Ct Head Wo Contrast 11/02/2014 Mild atrophy. No acute intracranial abnormality.   Dg Abd Acute W/chest 11/02/2014 Unremarkable bowel gas pattern; no free intra-abdominal air seen. 2. Mild left-sided airspace opacity could reflect a loculated pleural effusion or mild infection, slightly less prominent than on the recent chest radiograph. 3. Borderline cardiomegaly.  Medical Consultants:  Oncology  GI  Other Consultants:  None   IAnti-Infectives:   None    Signed:  Leisa Lenz, MD  [facility] 11/08/2014, 8:25 AM  Pager #: [phone]  Time spent in minutes: more than 30 minutes   Discharge Exam: Filed Vitals:   11/08/14 0538  BP: 107/53  Pulse: 84  Temp: 98.7 F (37.1 C)  Resp: 18   Filed Vitals:   11/07/14 0557 11/07/14 1500 11/07/14 2044 11/08/14 0538  BP: 121/60 113/52 112/47 107/53  Pulse: 85 82 79 84  Temp: 99.2 F (37.3 C) 99.6 F (37.6 C) 98.7 F (37.1 C) 98.7 F (37.1 C)  TempSrc: Oral Oral Oral Oral  Resp: 18 18 18 18   Height:      Weight:      SpO2: 94% 96% 95% 94%    General: Pt is alert, follows commands appropriately, not in acute distress Cardiovascular: Regular rate and rhythm, S1/S2 +, no murmurs Respiratory: Clear  to auscultation bilaterally, no wheezing, no crackles, no rhonchi Abdominal: Soft, non tender, non distended, bowel sounds +, no guarding Extremities: no edema, no cyanosis, pulses palpable bilaterally DP and PT Neuro: Grossly nonfocal  Discharge Instructions  Discharge Instructions    Call MD for:  difficulty breathing, headache or visual disturbances    Complete by:  As directed      Call MD for:  persistant nausea and vomiting    Complete by:  As directed      Call MD for:  severe uncontrolled pain    Complete by:  As directed      Diet - low sodium heart healthy    Complete by:  As directed      Discharge instructions    Complete by:  As directed   Endoscopy biopsy result showed gastritis. Gastric emptying study is normal. Continue sucralfate and protonix as prescribed. Hold aspirin due to risk of bleed.     Increase activity slowly    Complete by:  As directed             Medication List    STOP taking these medications        aspirin EC 81 MG tablet     Potassium Chloride 40 MEQ/15ML (20%) Soln      TAKE these medications        acetaminophen 500 MG tablet  Commonly known as:  TYLENOL  Take 500 mg by mouth every 6 (six) hours as needed for  fever.     ALPRAZolam 0.25 MG tablet  Commonly known as:  XANAX  Take 1 tablet (0.25 mg total) by mouth 3 (three) times daily as needed for anxiety.     diphenhydrAMINE 25 mg capsule  Commonly known as:  BENADRYL  Take 1 capsule (25 mg total) by mouth every 6 (six) hours as needed for itching. May take with Percocet PRN     famciclovir 500 MG tablet  Commonly known as:  FAMVIR  Take 1 tablet (500 mg total) by mouth daily.     famotidine 10 MG chewable tablet  Commonly known as:  PEPCID AC  Chew 10 mg by mouth 2 (two) times daily.     feeding supplement (ENSURE ENLIVE) Liqd  Take 237 mLs by mouth 2 (two) times daily between meals.     HYDROcodone-acetaminophen 5-325 MG tablet  Commonly known as:  NORCO/VICODIN   Take 1 tablet by mouth every 6 (six) hours as needed for moderate pain.     lidocaine 5 %  Commonly known as:  LIDODERM  Place 1 patch onto the skin daily. Remove & Discard patch within 12 hours or as directed by MD     lidocaine-prilocaine cream  Commonly known as:  EMLA  Apply 1 application topically as needed. Please dispense 2 tubes     LORazepam 0.5 MG tablet  Commonly known as:  ATIVAN  Take 1 tablet (0.5 mg total) by mouth every 6 (six) hours as needed (Nausea or vomiting).     metoprolol tartrate 25 MG tablet  Commonly known as:  LOPRESSOR  Take 0.5 tablets (12.5 mg total) by mouth 2 (two) times daily.     nitroGLYCERIN 0.4 MG SL tablet  Commonly known as:  NITROSTAT  Place 1 tablet (0.4 mg total) under the tongue every 5 (five) minutes as needed for chest pain.     OLANZapine 5 MG tablet  Commonly known as:  ZYPREXA  Take 1 tablet (5 mg total) by mouth daily.     ondansetron 4 MG disintegrating tablet  Commonly known as:  ZOFRAN ODT  Take 1 tablet (4 mg total) by mouth every 8 (eight) hours as needed for nausea or vomiting.     pantoprazole 40 MG tablet  Commonly known as:  PROTONIX  Take 1 tablet (40 mg total) by mouth 2 (two) times daily.     polyethylene glycol packet  Commonly known as:  MIRALAX / GLYCOLAX  Take 17 g by mouth daily.     potassium chloride SA 20 MEQ tablet  Commonly known as:  K-DUR,KLOR-CON  Take 1 tablet (20 mEq total) by mouth daily.     REFRESH TEARS 0.5 % Soln  Generic drug:  carboxymethylcellulose  Place 2 drops into both eyes 3 (three) times daily as needed (For dry eyes.).     senna-docusate 8.6-50 MG tablet  Commonly known as:  Senokot-S  Take 1 tablet by mouth at bedtime.     simvastatin 20 MG tablet  Commonly known as:  ZOCOR  Take 1 tablet (20 mg total) by mouth at bedtime.     sucralfate 1 GM/10ML suspension  Commonly known as:  CARAFATE  Take 10 mLs (1 g total) by mouth 4 (four) times daily -  with meals and at  bedtime.     temazepam 15 MG capsule  Commonly known as:  RESTORIL  Take 15 mg by mouth at bedtime as needed for sleep.     testosterone cypionate 100 MG/ML injection  Commonly known as:  DEPOTESTOTERONE CYPIONATE  Inject 400 mg into the muscle every 28 (twenty-eight) days. For IM use only     thiamine 100 MG tablet  Take 1 tablet (100 mg total) by mouth daily.     traMADol 50 MG tablet  Commonly known as:  ULTRAM  Take 1 tablet (50 mg total) by mouth every 6 (six) hours as needed.           Follow-up Information    Follow up with Shirline Frees, MD. Schedule an appointment as soon as possible for a visit in 1 week.   Specialty:  Family Medicine   Why:  Follow up appt after recent hospitalization   Contact information:   [address] [place] [id number] [phone]        The results of significant diagnostics from this hospitalization (including imaging, microbiology, ancillary and laboratory) are listed below for reference.    Significant Diagnostic Studies: Dg Chest 2 View  10/21/2014  CLINICAL DATA:  Altered mental status. EXAM: CHEST  2 VIEW COMPARISON:  October 17, 2014. FINDINGS: The heart size and mediastinal contours are within normal limits. Right internal jugular Port-A-Cath is stable with distal tip in expected position of SVC. Stable mild pleural thickening or loculated pleural effusion is noted in left hemithorax. Left internal jugular catheter noted on prior exam has been removed. No pneumothorax is noted. Right lung is clear. The visualized skeletal structures are unremarkable. IMPRESSION: Stable mild pleural thickening or loculated pleural effusion is noted involving the left hemithorax. No significant changes noted compared to prior exam. Electronically Signed   By: Marijo Conception, M.D.   On: 10/21/2014 20:29   Dg Chest 2 View  10/17/2014  CLINICAL DATA:  Status post thoracostomy with bleb stapling on October 11, 2014 EXAM: CHEST  2  VIEW COMPARISON:  Portable chest x-ray of October 16, 2014. FINDINGS: The lungs are well-expanded. There is persistent left apical soft tissue density. Pleural fluid or pleural thickening persists along the left lateral thoracic wall. No pneumothorax is evident. The interstitial markings of the left lung remain mildly increased. The right lung is well-expanded without focal abnormality. The heart and pulmonary vascularity are normal. There is stable tortuosity of the descending thoracic aorta. The power port appliance tip projects over junction of the middle and distal thirds of the SVC. The left internal jugular venous catheter tip projects over the junction of the right and left brachiocephalic veins. IMPRESSION: Stable appearance of the chest since the earlier study. There is no recurrent pneumothorax. Persistent left apical opacity with stable mildly increased interstitial markings predominantly on the left. Electronically Signed   By: David  Martinique M.D.   On: 10/17/2014 07:59   X-ray Chest Pa And Lateral  10/10/2014  CLINICAL DATA:  Chest tube placed last night. EXAM: CHEST  2 VIEW COMPARISON:  10/09/2014 FINDINGS: LEFT chest tube in place. Small LEFT apical pneumothorax is noted with the pleural edge 12 mm from the apical chest wall unchanged from prior. There is mild atelectasis at the LEFT lung base. RIGHT lung is clear. RIGHT power port noted. IMPRESSION: 1. Small LEFT apical pneumothorax with chest tube in place. These results will be called to the ordering clinician or representative by the Radiologist Assistant, and communication documented in the PACS or zVision Dashboard. Electronically Signed   By: Suzy Bouchard M.D.   On: 10/10/2014 09:05   Ct Head Wo Contrast  11/02/2014  CLINICAL DATA:  Altered mental  status. Episodes of verbal unresponsiveness. Severe nausea. EXAM: CT HEAD WITHOUT CONTRAST TECHNIQUE: Contiguous axial images were obtained from the base of the skull through the vertex  without intravenous contrast. COMPARISON:  None. FINDINGS: Mild generalized cerebral and cerebellar atrophy.No intracranial hemorrhage, mass effect, or midline shift. No hydrocephalus. The basilar cisterns are patent. No evidence of territorial infarct. No intracranial fluid collection. Calvarium is intact. Included paranasal sinuses and mastoid air cells are well aerated. IMPRESSION: Mild atrophy.  No acute intracranial abnormality. Electronically Signed   By: Jeb Levering M.D.   On: 11/02/2014 00:50   Ct Soft Tissue Neck W Contrast  11/03/2014  CLINICAL DATA:  Bilateral neck swelling. Myeloma. Ongoing chemotherapy. EXAM: CT NECK WITH CONTRAST TECHNIQUE: Multidetector CT imaging of the neck was performed using the standard protocol following the bolus administration of intravenous contrast. CONTRAST:  89mL OMNIPAQUE IOHEXOL 300 MG/ML  SOLN COMPARISON:  None. FINDINGS: Pharynx and larynx: No focal mucosal or submucosal lesions are present. Vocal cords are midline and symmetric. The tongue base is within normal limits. Salivary glands: There is a heterogeneous appearance of the submandibular glands bilaterally. Mild intraglandular ductal dilation is present. There no definite stones. The parotid glands are within normal limits bilaterally. There is thickening of the mylohyoid muscle and subcutaneous tissues. No discrete abscess is present. Thyroid: Negative Lymph nodes: No significant adenopathy is present in the neck. A right periaortic node measures 6.5 mm. Sub cm nodes are evident at the level of the aortic arch. Vascular: Atherosclerotic calcifications are present at the aortic arch in bilateral carotid bifurcations without significant stenosis. Limited intracranial: Within normal limits Visualized orbits: Negative Mastoids and visualized paranasal sinuses: The paranasal sinuses and mastoid air cells are clear. Skeleton: Chronic endplate degenerative change in uncovertebral spurring is most evident at  C6-7 with moderate foraminal narrowing on the left. A laminectomy defect is present on the right. Upper chest: A right IJ Port-A-Cath is in place. Apical blebs are noted bilaterally. A high-density pleural collection is present on the left. There are suture materials and calcification within this collection. There is some rim enhancement. This is likely related to talc pleurodesis. The area is incompletely imaged. IMPRESSION: 1. Inflammatory changes and heterogeneous enhancement of the submandibular glands bilaterally. There is mild intra ductal dilation without evidence for ductal obstruction. This is compatible with bilateral sialoadenitis. 2. Secondary inflammatory changes within subcutaneous soft tissues and mylohyoid muscle adjacent to the submandibular glands. 3. No significant cervical adenopathy. 4. Sub cm of mediastinal nodes. 5. High density peripherally enhancing pleural collection on the left is incompletely imaged. This is likely related to the recent talc pleurodesis. 6. Right-sided Port-A-Cath. Electronically Signed   By: San Morelle M.D.   On: 11/03/2014 12:32   Nm Gastric Emptying  11/07/2014  CLINICAL DATA:  Patient complaining of nausea, vomiting and sharp mid abdominal pain for 3-4 months. History of a cholecystectomy. EXAM: NUCLEAR MEDICINE GASTRIC EMPTYING SCAN TECHNIQUE: After oral ingestion of radiolabeled meal, sequential abdominal images were obtained for 120 minutes. Residual percentage of activity remaining within the stomach was calculated at 60 and 120 minutes. RADIOPHARMACEUTICALS:  2.2 mCi Tc-50m MDP labeled sulfur colloid orally COMPARISON:  CT, 11/02/2014 FINDINGS: Expected location of the stomach in the left upper quadrant. Ingested meal empties the stomach gradually over the course of the study with 12.5% retention at 60 min and 2.5% retention at 120 min (normal retention less than 30% at a 120 min). IMPRESSION: Normal gastric emptying study. Electronically Signed  By: Lajean Manes M.D.   On: 11/07/2014 12:52   Ct Abdomen Pelvis W Contrast  11/02/2014  CLINICAL DATA:  Acute onset of nausea and vomiting, status post recent chemotherapy. Known bone metastases. Generalized abdominal pain and neutropenia. Initial encounter. EXAM: CT ABDOMEN AND PELVIS WITH CONTRAST TECHNIQUE: Multidetector CT imaging of the abdomen and pelvis was performed using the standard protocol following bolus administration of intravenous contrast. CONTRAST:  156mL OMNIPAQUE IOHEXOL 300 MG/ML  SOLN COMPARISON:  CT of the chest, abdomen and pelvis performed 03/30/2014 FINDINGS: Minimal left basilar scarring is noted. Scattered coronary artery calcifications are seen. The liver is unremarkable in appearance. The spleen is enlarged, measuring 15.7 cm in length. The patient is status post cholecystectomy, with clips noted at the gallbladder fossa. The pancreas and adrenal glands are unremarkable. Small bilateral renal cysts are seen, measuring up to 2.3 cm in size. The kidneys are otherwise unremarkable in appearance. There is no evidence of hydronephrosis. No renal or ureteral stones are seen. No perinephric stranding is appreciated. No free fluid is identified. The small bowel is unremarkable in appearance. The stomach is within normal limits. No acute vascular abnormalities are seen. An IVC filter is noted. The patient's aortoiliac stent graft is grossly unremarkable in appearance. The appendix is normal in caliber, without evidence of appendicitis. Scattered diverticulosis is noted along the descending and sigmoid colon, without evidence of diverticulitis. The bladder is mildly distended and grossly unremarkable. The prostate is enlarged, measuring 5.3 cm in transverse dimension. No inguinal lymphadenopathy is seen. No acute osseous abnormalities are identified. Vacuum phenomenon is noted at L5-S1, with endplate sclerotic change. IMPRESSION: 1. No acute abnormality seen to explain the patient's  symptoms. 2. Scattered diverticulosis along the descending and sigmoid colon, without evidence of diverticulitis. 3. Aortoiliac stent graft is grossly unremarkable in appearance. 4. Small bilateral renal cysts seen. 5. Scattered coronary artery calcifications noted. 6. Splenomegaly noted. 7. Enlarged prostate seen. Electronically Signed   By: Garald Balding M.D.   On: 11/02/2014 02:54   Dg Chest Port 1 View  10/16/2014  CLINICAL DATA:  Status post left VATS and blebs stapling. EXAM: PORTABLE CHEST 1 VIEW COMPARISON:  10/16/2014 and prior studies FINDINGS: Mild cardiomegaly present. A right IJ Port-A-Cath with tip overlying the lower SVC and left IJ central venous catheter with tip overlying the mid SVC/ azygos region again noted. There is no evidence of pneumothorax. Ill-defined density overlying the left upper lung is unchanged. Surgical sutures/staples overlying the left lung apex again noted. No significant changes identified. IMPRESSION: Little significant change from the prior study except for no definite pneumothorax now identified. Support apparatus and left apical opacity again noted. Electronically Signed   By: Margarette Canada M.D.   On: 10/16/2014 14:28   Dg Chest Port 1 View  10/16/2014  CLINICAL DATA:  Atelectasis.  Follow-up pneumothorax EXAM: PORTABLE CHEST 1 VIEW COMPARISON:  10/15/2014 FINDINGS: Probable small left apical pneumothorax is unchanged. Left chest tube remains in place. Small left effusion is unchanged. Left upper lobe density may represent pleural fluid following left pleurodesis. Right jugular Port-A-Cath tip in the SVC unchanged. Left jugular central venous catheter tip in the SVC unchanged. Right lung remains clear. IMPRESSION: Small left apical pneumothorax unchanged. Small left effusion. Left upper lobe density unchanged consistent with pleurodesis. Electronically Signed   By: Franchot Gallo M.D.   On: 10/16/2014 07:41   Dg Chest Port 1 View  10/15/2014  CLINICAL DATA:  Short  of breath EXAM: PORTABLE CHEST  1 VIEW COMPARISON:  Radiograph 10/14/2014 FINDINGS: Interval removal of 1 of 2 LEFT chest tubes. Small LEFT apical pneumothorax is noted less than 10% volume. Small anterior pneumothorax noted. Subpulmonic pneumothorax is noted at the LEFT lung base. There is edema within the LEFT upper lobe. LEFT central venous line and RIGHT power port unchanged. RIGHT lung is clear. IMPRESSION: 1. Small RIGHT apical, anterior and subpulmonic pneumothorax noted following LEFT upper chest tube removal. 2. Edema within the upper lobe. 3. Lower LEFT chest tube remains in place. 4. Small effusion. Findings conveyed toICU nurse Mickel Baas  on 10/15/2014  at07:56. Electronically Signed   By: Suzy Bouchard M.D.   On: 10/15/2014 08:00   Dg Chest Port 1 View  10/14/2014  CLINICAL DATA:  Pneumothorax EXAM: PORTABLE CHEST 1 VIEW COMPARISON:  10/13/2014 FINDINGS: Right-sided Port-A-Cath remains in place with tip over the distal SVC. Left IJ approach central line tip terminates over the brachiocephalic/ SVC junction. Two left-sided chest tubes remain in place. Hazy left apical opacity and small left pleural effusion persist. Persistent presumed scarring at the lung bases versus atelectasis. No pneumothorax identified. IMPRESSION: No significant change in small left pneumothorax with hazy left apical pulmonary opacity. Electronically Signed   By: Conchita Paris M.D.   On: 10/14/2014 08:01   Dg Chest Port 1 View  10/13/2014  CLINICAL DATA:  Follow-up pneumothorax, status post bleb stapling on the left. EXAM: PORTABLE CHEST 1 VIEW COMPARISON:  Portable chest x-ray of October 12, 2014 FINDINGS: The lungs are well-expanded. No pneumothorax on the left is evident. The 2 chest tubes are unchanged. There is some pleural fluid low layering laterally along the thoracic wall which is stable. The interstitial markings of the left lung remain increased diffusely. There is no significant mediastinal shift. The right lung  is well expanded and grossly clear. The cardiac silhouette is top-normal in size. The pulmonary vascularity is normal. The Port-A-Cath appliance tip projects over the midportion of the SVC. The left internal jugular venous catheter tip projects over the proximal SVC. IMPRESSION: Slight interval improvement in aeration of the left lung. There is no pneumothorax. Pulmonary interstitial edema or infiltrate on the left persists. The support tubes are in reasonable position. Electronically Signed   By: David  Martinique M.D.   On: 10/13/2014 07:22   Dg Chest Port 1 View  10/12/2014  CLINICAL DATA:  Post VATS.  Left pneumothorax, chest tubes. EXAM: PORTABLE CHEST 1 VIEW COMPARISON:  10/11/2014 FINDINGS: Interval removal of endotracheal tube. Remainder of the support devices are stable. No pneumothorax on the left. Stable postoperative changes on the left with pleural thickening or effusion. Cardiomegaly. Improved aeration in the right lung with continued diffuse left lung airspace disease. IMPRESSION: Improving aeration on the right. Stable of postoperative appearance on the left. Interval extubation Electronically Signed   By: Rolm Baptise M.D.   On: 10/12/2014 07:39   Dg Chest Portable 1 View  10/11/2014  CLINICAL DATA:  Status post chest tube placement. Status post stapling of 3 the left upper lobe blebs and talc pleurodesis on the left today. EXAM: PORTABLE CHEST 1 VIEW COMPARISON:  PA and lateral chest 10/10/2014. FINDINGS: 2 left chest tubes are identified there is no pneumothorax. Right IJ catheter is again seen. Left IJ catheter tip projects just within the superior vena cava. Endotracheal tube is identified with the tip in the left mainstem bronchus. Review of the chart indicates this is a double lumen tube. Increased density in the left upper chest is  consistent with changes related to pleurodesis. Surgical staples are also seen in the left upper chest consistent with bleb stapling. No pneumothorax is  identified. Heart size is mildly enlarged. IMPRESSION: Negative for pneumothorax with 2 left chest tubes in place. Findings consistent with bleb stapling and left pleurodesis are identified. Double lumen endotracheal tube is in place with the lumen within the left mainstem bronchus. Left IJ approach central venous catheter tip projects in the upper superior vena cava. Electronically Signed   By: Inge Rise M.D.   On: 10/11/2014 11:16   Dg Chest Portable 1 View  10/09/2014  CLINICAL DATA:  Chest tube placement EXAM: PORTABLE CHEST 1 VIEW COMPARISON:  Earlier today FINDINGS: New left-sided chest tube with tip at the apex. No residual pneumothorax is seen. Paraseptal emphysema at the apices based on previous chest CT. No pleural fluid or collapse. Diffuse interstitial coarsening. Normal heart size and stable aortic tortuosity when accounting for differences in technique. Right IJ porta catheter, tip stable at the lower SVC. IMPRESSION: 1. Evacuated pneumothorax after left chest tube placement. 2. Paraseptal emphysema at the apices. Electronically Signed   By: Monte Fantasia M.D.   On: 10/09/2014 21:48   Dg Chest Port 1 View  10/09/2014  CLINICAL DATA:  Shortness of breath. History of right Port-A-Cath placement on Thursday. EXAM: PORTABLE CHEST 1 VIEW COMPARISON:  PA and lateral chest 10/04/2014. FINDINGS: The patient has a very large left pneumothorax. Right IJ approach Port-A-Cath is in place with the tip projecting at the superior cavoatrial junction. The right lung is expanded and clear. Heart size is normal. There is no mediastinal shift. No focal bony abnormality. IMPRESSION: Very large left pneumothorax. Critical Value/emergent results were called by telephone at the time of interpretation on 10/09/2014 at 7:16 pm to [facility] CAMPRU[facility]-SOM[facility]. P.A., who verbally acknowledged these results. Electronically Signed   By: Inge Rise M.D.   On: 10/09/2014 19:17   Dg Abd Acute W/chest  11/02/2014   CLINICAL DATA:  Acute onset of severe nausea. Patient on radiation therapy and chemotherapy for bone cancer. Initial encounter. EXAM: DG ABDOMEN ACUTE W/ 1V CHEST COMPARISON:  Chest radiograph performed 10/21/2014, and abdominal radiograph performed 10/12/2014 FINDINGS: The lungs are well-aerated. Mild left-sided airspace opacity could reflect a loculated effusion or mild infection. There is no evidence of pneumothorax. The cardiomediastinal silhouette is borderline enlarged. A right-sided chest port is noted ending about the distal SVC. The visualized bowel gas pattern is unremarkable. Scattered stool and air are seen within the colon; there is no evidence of small bowel dilatation to suggest obstruction. No free intra-abdominal air is identified on the provided decubitus view. No acute osseous abnormalities are seen; the sacroiliac joints are unremarkable in appearance. An IVC filter is noted. An aortoiliac stent graft is seen. Clips are noted within the right upper quadrant, reflecting prior cholecystectomy. IMPRESSION: 1. Unremarkable bowel gas pattern; no free intra-abdominal air seen. 2. Mild left-sided airspace opacity could reflect a loculated pleural effusion or mild infection, slightly less prominent than on the recent chest radiograph. 3. Borderline cardiomegaly. Electronically Signed   By: Garald Balding M.D.   On: 11/02/2014 01:02   Dg Abd Portable 1v  10/12/2014  CLINICAL DATA:  Abdominal distension. EXAM: PORTABLE ABDOMEN - 1 VIEW COMPARISON:  Radiographs 06/10/2014 FINDINGS: Moderate air distended small bowel and colon suggesting a diffuse ileus. No free air. The soft tissue shadows are grossly maintained. The bony structures are intact. Stable aortoiliac stent graft. An IVC filter is noted.  IMPRESSION: Ileus appearing bowel gas pattern.  No free air. Electronically Signed   By: Marijo Sanes M.D.   On: 10/12/2014 11:42    Microbiology: Recent Results (from the past 240 hour(s))  Blood  culture (routine x 2)     Status: None   Collection Time: 11/01/14 11:51 PM  Result Value Ref Range Status   Specimen Description BLOOD RIGHT ANTECUBITAL  Final   Special Requests BOTTLES DRAWN AEROBIC AND ANAEROBIC 5ML  Final   Culture   Final    NO GROWTH 5 DAYS Performed at [facility]    Report Status 11/07/2014 FINAL  Final  Blood culture (routine x 2)     Status: None   Collection Time: 11/02/14 12:51 AM  Result Value Ref Range Status   Specimen Description BLOOD RIGHT CHEST  Final   Special Requests BOTTLES DRAWN AEROBIC AND ANAEROBIC 5ML  Final   Culture   Final    NO GROWTH 5 DAYS Performed at [facility]    Report Status 11/07/2014 FINAL  Final     Labs: Basic Metabolic Panel:  Recent Labs Lab 11/02/14 0443 11/03/14 0520 11/04/14 0510 11/05/14 0520 11/06/14 0448  NA 137 139 141 139 138  K 3.6 3.4* 3.6 2.9* 3.2*  CL 108 107 106 104 104  CO2 22 25 26 29 28   GLUCOSE 106* 82 124* 80 92  BUN 21* 15 13 13 20   CREATININE 1.02 1.02 0.97 1.08 1.15  CALCIUM 7.7* 8.0* 8.3* 8.3* 8.5*   Liver Function Tests:  Recent Labs Lab 11/01/14 2351 11/02/14 0443  AST 22 15  ALT 15* 14*  ALKPHOS 78 69  BILITOT 0.6 0.8  PROT 7.8 6.6  ALBUMIN 3.1* 2.7*    Recent Labs Lab 11/01/14 2351  LIPASE 29   No results for input(s): AMMONIA in the last 168 hours. CBC:  Recent Labs Lab 11/01/14 2351  11/02/14 0443 11/03/14 0520 11/04/14 0510 11/05/14 0520 11/06/14 0448  WBC 0.9*  --  0.8* 1.2* 4.0 4.0 5.0  NEUTROABS 0.7*  --   --  0.5*  --   --  3.7  HGB 7.3*  < > 6.4* 7.1* 7.5* 7.4* 7.4*  HCT 21.6*  < > 18.9* 20.9* 23.1* 21.9* 22.5*  MCV 82.8  --  84.4 85.0 84.3 86.2 86.9  PLT 17*  --  13* 32* 35* 50* 44*  < > = values in this interval not displayed. Cardiac Enzymes: No results for input(s): CKTOTAL, CKMB, CKMBINDEX, TROPONINI in the last 168 hours. BNP: BNP (last 3 results) No results for input(s): BNP in the last 8760 hours.  ProBNP (last 3  results) No results for input(s): PROBNP in the last 8760 hours.  CBG:  Recent Labs Lab 11/04/14 0749 11/05/14 0810 11/06/14 0722 11/07/14 0758 11/08/14 0535  GLUCAP 107* 78 73 91 105*

## 2014-11-09 ENCOUNTER — Ambulatory Visit: Payer: Medicare Other

## 2014-11-10 ENCOUNTER — Ambulatory Visit: Payer: Medicare Other

## 2014-11-11 ENCOUNTER — Ambulatory Visit: Payer: Medicare Other

## 2014-11-13 ENCOUNTER — Encounter: Payer: Self-pay | Admitting: Hematology & Oncology

## 2014-11-14 ENCOUNTER — Ambulatory Visit (HOSPITAL_BASED_OUTPATIENT_CLINIC_OR_DEPARTMENT_OTHER): Payer: Medicare Other

## 2014-11-14 ENCOUNTER — Other Ambulatory Visit: Payer: Medicare Other

## 2014-11-14 ENCOUNTER — Ambulatory Visit: Payer: Medicare Other

## 2014-11-14 ENCOUNTER — Other Ambulatory Visit: Payer: Self-pay | Admitting: Interventional Radiology

## 2014-11-14 ENCOUNTER — Other Ambulatory Visit: Payer: Self-pay | Admitting: Family

## 2014-11-14 ENCOUNTER — Telehealth: Payer: Self-pay | Admitting: *Deleted

## 2014-11-14 ENCOUNTER — Other Ambulatory Visit (HOSPITAL_BASED_OUTPATIENT_CLINIC_OR_DEPARTMENT_OTHER): Payer: Medicare Other

## 2014-11-14 VITALS — BP 105/54 | HR 62 | Temp 98.5°F | Resp 18

## 2014-11-14 DIAGNOSIS — D46Z Other myelodysplastic syndromes: Secondary | ICD-10-CM

## 2014-11-14 DIAGNOSIS — D469 Myelodysplastic syndrome, unspecified: Secondary | ICD-10-CM

## 2014-11-14 DIAGNOSIS — I82439 Acute embolism and thrombosis of unspecified popliteal vein: Secondary | ICD-10-CM

## 2014-11-14 DIAGNOSIS — D4620 Refractory anemia with excess of blasts, unspecified: Secondary | ICD-10-CM | POA: Diagnosis present

## 2014-11-14 DIAGNOSIS — Z452 Encounter for adjustment and management of vascular access device: Secondary | ICD-10-CM | POA: Diagnosis present

## 2014-11-14 DIAGNOSIS — C9200 Acute myeloblastic leukemia, not having achieved remission: Secondary | ICD-10-CM | POA: Diagnosis present

## 2014-11-14 DIAGNOSIS — D649 Anemia, unspecified: Secondary | ICD-10-CM

## 2014-11-14 LAB — CMP (CANCER CENTER ONLY)
ALBUMIN: 2.9 g/dL — AB (ref 3.3–5.5)
ALT(SGPT): 20 U/L (ref 10–47)
AST: 17 U/L (ref 11–38)
Alkaline Phosphatase: 71 U/L (ref 26–84)
BUN, Bld: 18 mg/dL (ref 7–22)
CALCIUM: 8.4 mg/dL (ref 8.0–10.3)
CHLORIDE: 106 meq/L (ref 98–108)
CO2: 22 mEq/L (ref 18–33)
CREATININE: 1.1 mg/dL (ref 0.6–1.2)
Glucose, Bld: 133 mg/dL — ABNORMAL HIGH (ref 73–118)
POTASSIUM: 3.3 meq/L (ref 3.3–4.7)
Sodium: 140 mEq/L (ref 128–145)
TOTAL PROTEIN: 7.3 g/dL (ref 6.4–8.1)
Total Bilirubin: 0.6 mg/dl (ref 0.20–1.60)

## 2014-11-14 LAB — CBC WITH DIFFERENTIAL (CANCER CENTER ONLY)
HEMATOCRIT: 19.9 % — AB (ref 38.7–49.9)
HEMOGLOBIN: 6.4 g/dL — AB (ref 13.0–17.1)
MCH: 28.4 pg (ref 28.0–33.4)
MCHC: 32.2 g/dL (ref 32.0–35.9)
MCV: 88 fL (ref 82–98)
Platelets: 26 10*3/uL — ABNORMAL LOW (ref 145–400)
RBC: 2.25 10*6/uL — AB (ref 4.20–5.70)
RDW: 13.5 % (ref 11.1–15.7)
WBC: 1.8 10*3/uL — ABNORMAL LOW (ref 4.0–10.0)

## 2014-11-14 LAB — MANUAL DIFFERENTIAL (CHCC SATELLITE)
ALC: 0.5 10*3/uL — AB (ref 0.9–3.3)
ANC (CHCC MAN DIFF): 1 10*3/uL — AB (ref 1.5–6.5)
BAND NEUTROPHILS: 2 % (ref 0–10)
LYMPH: 31 % (ref 14–48)
MONO: 13 % (ref 0–13)
PLT EST ~~LOC~~: DECREASED
SEG: 54 % (ref 40–75)

## 2014-11-14 LAB — HOLD TUBE, BLOOD BANK - CHCC SATELLITE

## 2014-11-14 MED ORDER — HEPARIN SOD (PORK) LOCK FLUSH 100 UNIT/ML IV SOLN
500.0000 [IU] | Freq: Once | INTRAVENOUS | Status: AC
  Administered 2014-11-14: 500 [IU] via INTRAVENOUS
  Filled 2014-11-14: qty 5

## 2014-11-14 MED ORDER — ALTEPLASE 2 MG IJ SOLR
2.0000 mg | Freq: Once | INTRAMUSCULAR | Status: AC
  Administered 2014-11-14: 2 mg
  Filled 2014-11-14: qty 2

## 2014-11-14 MED ORDER — ALTEPLASE 2 MG IJ SOLR
INTRAMUSCULAR | Status: AC
  Filled 2014-11-14: qty 2

## 2014-11-14 MED ORDER — SODIUM CHLORIDE 0.9 % IJ SOLN
10.0000 mL | INTRAMUSCULAR | Status: DC | PRN
  Administered 2014-11-14: 10 mL via INTRAVENOUS
  Filled 2014-11-14: qty 10

## 2014-11-14 NOTE — Telephone Encounter (Signed)
Critical Value HGB 6.4 Dr Marin Olp notified, orders will be placed.

## 2014-11-14 NOTE — Patient Instructions (Signed)

## 2014-11-15 ENCOUNTER — Ambulatory Visit: Payer: Medicare Other

## 2014-11-15 ENCOUNTER — Ambulatory Visit (HOSPITAL_COMMUNITY)
Admission: RE | Admit: 2014-11-15 | Discharge: 2014-11-15 | Disposition: A | Discharge status: Home / Self Care | Payer: Medicare Other | Source: Ambulatory Visit | Attending: Hematology & Oncology | Admitting: Hematology & Oncology

## 2014-11-15 ENCOUNTER — Other Ambulatory Visit: Payer: Medicare Other

## 2014-11-15 ENCOUNTER — Ambulatory Visit (HOSPITAL_BASED_OUTPATIENT_CLINIC_OR_DEPARTMENT_OTHER): Payer: Medicare Other

## 2014-11-15 VITALS — BP 110/63 | HR 62 | Temp 98.2°F | Resp 18

## 2014-11-15 DIAGNOSIS — D696 Thrombocytopenia, unspecified: Secondary | ICD-10-CM | POA: Diagnosis not present

## 2014-11-15 DIAGNOSIS — D4620 Refractory anemia with excess of blasts, unspecified: Secondary | ICD-10-CM | POA: Diagnosis present

## 2014-11-15 DIAGNOSIS — D46Z Other myelodysplastic syndromes: Secondary | ICD-10-CM

## 2014-11-15 DIAGNOSIS — D649 Anemia, unspecified: Secondary | ICD-10-CM

## 2014-11-15 DIAGNOSIS — D4622 Refractory anemia with excess of blasts 2: Secondary | ICD-10-CM | POA: Insufficient documentation

## 2014-11-15 LAB — PREPARE RBC (CROSSMATCH)

## 2014-11-15 MED ORDER — SODIUM CHLORIDE 0.9 % IV SOLN
250.0000 mL | Freq: Once | INTRAVENOUS | Status: AC
  Administered 2014-11-15: 250 mL via INTRAVENOUS

## 2014-11-15 MED ORDER — HEPARIN SOD (PORK) LOCK FLUSH 100 UNIT/ML IV SOLN
250.0000 [IU] | INTRAVENOUS | Status: AC | PRN
  Administered 2014-11-15: 250 [IU]
  Filled 2014-11-15: qty 5

## 2014-11-15 MED ORDER — ACETAMINOPHEN 325 MG PO TABS
ORAL_TABLET | ORAL | Status: AC
  Filled 2014-11-15: qty 2

## 2014-11-15 MED ORDER — FUROSEMIDE 10 MG/ML IJ SOLN: 20.0000 mg | Freq: Once | INTRAMUSCULAR | Status: DC

## 2014-11-15 MED ORDER — ACETAMINOPHEN 325 MG PO TABS
650.0000 mg | ORAL_TABLET | Freq: Once | ORAL | Status: AC
  Administered 2014-11-15: 650 mg via ORAL

## 2014-11-15 MED ORDER — DIPHENHYDRAMINE HCL 25 MG PO CAPS
ORAL_CAPSULE | ORAL | Status: AC
  Filled 2014-11-15: qty 1

## 2014-11-15 MED ORDER — DIPHENHYDRAMINE HCL 25 MG PO CAPS
25.0000 mg | ORAL_CAPSULE | Freq: Once | ORAL | Status: AC
  Administered 2014-11-15: 25 mg via ORAL

## 2014-11-15 MED ORDER — SODIUM CHLORIDE 0.9 % IJ SOLN
3.0000 mL | INTRAMUSCULAR | Status: AC | PRN
  Administered 2014-11-15: 10 mL
  Filled 2014-11-15: qty 10

## 2014-11-15 NOTE — Patient Instructions (Signed)

## 2014-11-16 ENCOUNTER — Other Ambulatory Visit: Payer: Self-pay

## 2014-11-16 ENCOUNTER — Encounter (HOSPITAL_COMMUNITY): Payer: Self-pay

## 2014-11-16 ENCOUNTER — Emergency Department (HOSPITAL_COMMUNITY): Payer: Medicare Other

## 2014-11-16 ENCOUNTER — Emergency Department (HOSPITAL_COMMUNITY)
Admission: EM | Admit: 2014-11-16 | Discharge: 2014-11-17 | Disposition: A | Discharge status: Home / Self Care | Payer: Medicare Other | Attending: Emergency Medicine | Admitting: Emergency Medicine

## 2014-11-16 DIAGNOSIS — R509 Fever, unspecified: Secondary | ICD-10-CM | POA: Insufficient documentation

## 2014-11-16 DIAGNOSIS — I252 Old myocardial infarction: Secondary | ICD-10-CM | POA: Diagnosis not present

## 2014-11-16 DIAGNOSIS — Z872 Personal history of diseases of the skin and subcutaneous tissue: Secondary | ICD-10-CM | POA: Insufficient documentation

## 2014-11-16 DIAGNOSIS — R109 Unspecified abdominal pain: Secondary | ICD-10-CM | POA: Insufficient documentation

## 2014-11-16 DIAGNOSIS — Z79899 Other long term (current) drug therapy: Secondary | ICD-10-CM | POA: Diagnosis not present

## 2014-11-16 DIAGNOSIS — I10 Essential (primary) hypertension: Secondary | ICD-10-CM | POA: Insufficient documentation

## 2014-11-16 DIAGNOSIS — Z862 Personal history of diseases of the blood and blood-forming organs and certain disorders involving the immune mechanism: Secondary | ICD-10-CM | POA: Insufficient documentation

## 2014-11-16 DIAGNOSIS — F329 Major depressive disorder, single episode, unspecified: Secondary | ICD-10-CM | POA: Insufficient documentation

## 2014-11-16 DIAGNOSIS — Z8709 Personal history of other diseases of the respiratory system: Secondary | ICD-10-CM | POA: Diagnosis not present

## 2014-11-16 DIAGNOSIS — Z9861 Coronary angioplasty status: Secondary | ICD-10-CM | POA: Insufficient documentation

## 2014-11-16 DIAGNOSIS — I2510 Atherosclerotic heart disease of native coronary artery without angina pectoris: Secondary | ICD-10-CM | POA: Diagnosis not present

## 2014-11-16 DIAGNOSIS — Z9049 Acquired absence of other specified parts of digestive tract: Secondary | ICD-10-CM | POA: Diagnosis not present

## 2014-11-16 DIAGNOSIS — Z87891 Personal history of nicotine dependence: Secondary | ICD-10-CM | POA: Insufficient documentation

## 2014-11-16 DIAGNOSIS — E7800 Pure hypercholesterolemia, unspecified: Secondary | ICD-10-CM | POA: Diagnosis not present

## 2014-11-16 DIAGNOSIS — K219 Gastro-esophageal reflux disease without esophagitis: Secondary | ICD-10-CM | POA: Insufficient documentation

## 2014-11-16 DIAGNOSIS — R112 Nausea with vomiting, unspecified: Secondary | ICD-10-CM | POA: Diagnosis present

## 2014-11-16 DIAGNOSIS — R1115 Cyclical vomiting syndrome unrelated to migraine: Secondary | ICD-10-CM

## 2014-11-16 HISTORY — DX: Ulcer of esophagus without bleeding: K22.10

## 2014-11-16 LAB — CBC WITH DIFFERENTIAL/PLATELET
BASOS PCT: 0 %
Basophils Absolute: 0 10*3/uL (ref 0.0–0.1)
EOS PCT: 0 %
Eosinophils Absolute: 0 10*3/uL (ref 0.0–0.7)
HEMATOCRIT: 21.3 % — AB (ref 39.0–52.0)
HEMOGLOBIN: 7.2 g/dL — AB (ref 13.0–17.0)
LYMPHS PCT: 19 %
Lymphs Abs: 0.5 10*3/uL — ABNORMAL LOW (ref 0.7–4.0)
MCH: 29.1 pg (ref 26.0–34.0)
MCHC: 33.8 g/dL (ref 30.0–36.0)
MCV: 86.2 fL (ref 78.0–100.0)
Monocytes Absolute: 0.4 10*3/uL (ref 0.1–1.0)
Monocytes Relative: 16 %
NEUTROS PCT: 65 %
Neutro Abs: 1.6 10*3/uL — ABNORMAL LOW (ref 1.7–7.7)
Platelets: 30 10*3/uL — ABNORMAL LOW (ref 150–400)
RBC: 2.47 MIL/uL — ABNORMAL LOW (ref 4.22–5.81)
RDW: 14.2 % (ref 11.5–15.5)
WBC: 2.5 10*3/uL — ABNORMAL LOW (ref 4.0–10.5)

## 2014-11-16 LAB — TYPE AND SCREEN
ABO/RH(D): A POS
ANTIBODY SCREEN: NEGATIVE
UNIT DIVISION: 0
UNIT DIVISION: 0

## 2014-11-16 LAB — COMPREHENSIVE METABOLIC PANEL
ALK PHOS: 80 U/L (ref 38–126)
ALT: 16 U/L — AB (ref 17–63)
AST: 13 U/L — ABNORMAL LOW (ref 15–41)
Albumin: 3.7 g/dL (ref 3.5–5.0)
Anion gap: 8 (ref 5–15)
BILIRUBIN TOTAL: 0.9 mg/dL (ref 0.3–1.2)
BUN: 20 mg/dL (ref 6–20)
CALCIUM: 9 mg/dL (ref 8.9–10.3)
CO2: 23 mmol/L (ref 22–32)
CREATININE: 1.18 mg/dL (ref 0.61–1.24)
Chloride: 109 mmol/L (ref 101–111)
Glucose, Bld: 127 mg/dL — ABNORMAL HIGH (ref 65–99)
Potassium: 3.4 mmol/L — ABNORMAL LOW (ref 3.5–5.1)
Sodium: 140 mmol/L (ref 135–145)
TOTAL PROTEIN: 7.7 g/dL (ref 6.5–8.1)

## 2014-11-16 LAB — LIPASE, BLOOD: Lipase: 34 U/L (ref 11–51)

## 2014-11-16 MED ORDER — SODIUM CHLORIDE 0.9 % IV SOLN
INTRAVENOUS | Status: DC
  Administered 2014-11-16: 22:00:00 via INTRAVENOUS

## 2014-11-16 MED ORDER — SODIUM CHLORIDE 0.9 % IV BOLUS (SEPSIS): 250.0000 mL | Freq: Once | INTRAVENOUS | Status: DC

## 2014-11-16 MED ORDER — SODIUM CHLORIDE 0.9 % IV SOLN: INTRAVENOUS | Status: DC

## 2014-11-16 MED ORDER — SODIUM CHLORIDE 0.9 % IV BOLUS (SEPSIS)
500.0000 mL | Freq: Once | INTRAVENOUS | Status: AC
  Administered 2014-11-16: 500 mL via INTRAVENOUS

## 2014-11-16 MED ORDER — HYDROMORPHONE HCL 1 MG/ML IJ SOLN
1.0000 mg | Freq: Once | INTRAMUSCULAR | Status: AC
  Administered 2014-11-16: 1 mg via INTRAVENOUS
  Filled 2014-11-16: qty 1

## 2014-11-16 MED ORDER — PANTOPRAZOLE SODIUM 40 MG IV SOLR
40.0000 mg | Freq: Once | INTRAVENOUS | Status: AC
  Administered 2014-11-16: 40 mg via INTRAVENOUS
  Filled 2014-11-16: qty 40

## 2014-11-16 MED ORDER — HEPARIN SOD (PORK) LOCK FLUSH 100 UNIT/ML IV SOLN
500.0000 [IU] | Freq: Once | INTRAVENOUS | Status: AC
  Administered 2014-11-16: 500 [IU]
  Filled 2014-11-16: qty 5

## 2014-11-16 MED ORDER — ONDANSETRON HCL 4 MG/2ML IJ SOLN
4.0000 mg | Freq: Once | INTRAMUSCULAR | Status: AC
  Administered 2014-11-16: 4 mg via INTRAVENOUS
  Filled 2014-11-16: qty 2

## 2014-11-16 NOTE — ED Notes (Signed)
Bed: MV67 Expected date:  Expected time:  Means of arrival:  Comments: Ems- cancer pt

## 2014-11-16 NOTE — ED Notes (Signed)
Patient given sprite.

## 2014-11-16 NOTE — ED Notes (Signed)
Pt requesting blood draw with access of port-a-cath

## 2014-11-16 NOTE — ED Notes (Signed)
Patient's wife states that the patient was having chest pain at home prior to coming to the ED.

## 2014-11-16 NOTE — ED Notes (Signed)
Patient and wife repeatedly requesting nausea medication. Advised patient has received all the medications we can give until the physician sees him.

## 2014-11-16 NOTE — ED Provider Notes (Signed)
CSN: [id number]     Arrival date & time 11/16/14  1847 History   First MD Initiated Contact with Patient 11/16/14 1919     Chief Complaint  Patient presents with  . Abdominal Pain  . Emesis     (Consider location/radiation/quality/duration/timing/severity/associated sxs/prior Treatment) The history is provided by the patient.   69 year old male with a history of recurrent abdominal pain and sickle cell: Vomiting syndrome. Patient's had a workup that ruled out gastric emptying problem. Patient's followed by hematology oncology for blood cancer. Patient also followed by GI medicine. Patient recently had admission with discharge home on October 25. Patient had blood transfusion yesterday.  Patient felt fine until 9:00 this morning then developed the abdominal pain. Patient started with a sickle vomiting at 11:00 this morning. Has not been able to stop. GI medicine has not been able to cone with specific diagnosis. Patient does have Protonix at home.   Past Medical History  Diagnosis Date  . Coronary atherosclerosis of native coronary artery     a. 10/2008 inf STEMI/PCI: LM 50d (IVUS-borderline lesion->med rx), LAD min irregs, LCX 7m 70d, OM nl, RCA [id number] Vision BMS);  b. 11/2008 Lexiscan MV: EF 65%, no isch/scar.  . Essential hypertension, benign   . Pure hypercholesterolemia   . AAA (abdominal aortic aneurysm) ([facility]    a. 05/2013 CT: 5.8 cm AAA.  . Marland Kitcheniverticulitis     a. 05/2013 CT: descending/sigmoid jxn w/o abscess.  . Osteoarthritis   . Tobacco abuse     a. ongoing - 1ppd for better part of 50 yrs.  . Normocytic anemia   . Cellulitis 06/10/2013    Right antecubital fossa at site of IV  03/12/13  . PONV (postoperative nausea and vomiting)   . Diverticulitis   . GERD (gastroesophageal reflux disease)   . MDS (myelodysplastic syndrome), high grade ([facility]/2016  . Myocardial infarction ([facility]2010  . Depression   . Spontaneous pneumothorax 09/14/2014    LEFT LUNG  .  Hypotestosteronemia 10/04/2014  . Esophageal ulcer    Past Surgical History  Procedure Laterality Date  . Cardiac stents  2010  . Cholecystectomy N/A 06/19/2013    Procedure: LAPAROSCOPIC CHOLECYSTECTOMY;  Surgeon: DoHarl BowieMD;  Location: [facility] Service: General;  Laterality: N/A;  . Abdominal aortic endovascular stent graft N/A 07/02/2013    Procedure: ABDOMINAL AORTIC ENDOVASCULAR STENT GRAFT;  Surgeon: VaSerafina MitchellMD;  [facility];  Service: Vascular;  Laterality: N/A;  . Esophagogastroduodenoscopy N/A 08/05/2013    Procedure: ESOPHAGOGASTRODUODENOSCOPY (EGD);  Surgeon: ViLear NgMD;  [facility];  Service: Endoscopy;  Laterality: N/A;  . Eye surgery Left     cataract surgery  . Back surgery      cervical fusion  . Colonoscopy with propofol N/A 10/15/2013    Procedure: COLONOSCOPY WITH PROPOFOL;  Surgeon: ViLear NgMD;  [facility]: End[facility];  Laterality: N/A;  . Fetal blood transfusion  March 16,17,18, 2016  . Bone marrow biopsy  April 01, 2014  . Chest tube insertion  09/14/2014  . Video assisted thoracoscopy Left 10/11/2014    Procedure: VIDEO ASSISTED THORACOSCOPY;  Surgeon: PeIvin PootMD;  [place]: [facility] Service: Thoracic;  Laterality: Left;  . Stapling of blebs Left 10/11/2014    Procedure: STAPLING OF BLEBS;  Surgeon: PeIvin PootMD;  [facility];  Service: Thoracic;  Laterality: Left;  . Esophagogastroduodenoscopy N/A 11/04/2014    Procedure:  ESOPHAGOGASTRODUODENOSCOPY (EGD);  Surgeon: Laurence Spates, MD;  Location: [facility];  Service: Endoscopy;  Laterality: N/A;   Family History  Problem Relation Age of Onset  . Heart attack Brother     5s  . Cancer Father     Lung  . Cancer Mother     Brain  . Cancer Brother     Kidney   Social History  Substance Use Topics  . Smoking status: Former Smoker -- 1.00 packs/day for 60 years    Types: Cigarettes    Start date: 04/05/1964  .  Smokeless tobacco: Never Used     Comment: 09/14/2014  . Alcohol Use: No    Review of Systems  Constitutional: Negative for fever.  HENT: Negative for congestion.   Eyes: Negative for visual disturbance.  Respiratory: Negative for shortness of breath.   Cardiovascular: Negative for chest pain.  Gastrointestinal: Positive for nausea, vomiting and abdominal pain. Negative for diarrhea.  Genitourinary: Negative for dysuria.  Musculoskeletal: Negative for back pain.  Skin: Negative for rash.  Neurological: Negative for headaches.  Hematological: Bruises/bleeds easily.  Psychiatric/Behavioral: Negative for confusion.      Allergies  Albuterol-ipratropium-soybean lecithin; Cephalexin; Compazine; Percocet; Phenergan; Tramadol; Vicodin; Ativan; Marinol; and Reglan  Home Medications   Prior to Admission medications   Medication Sig Start Date End Date Taking? Authorizing Provider  acetaminophen (TYLENOL) 500 MG tablet Take 500 mg by mouth every 6 (six) hours as needed for fever.   Yes Historical Provider, MD  ALPRAZolam (XANAX) 0.25 MG tablet Take 1 tablet (0.25 mg total) by mouth 3 (three) times daily as needed for anxiety. 10/03/14  Yes Volanda Napoleon, MD  carboxymethylcellulose (REFRESH TEARS) 0.5 % SOLN Place 2 drops into both eyes 3 (three) times daily as needed (For dry eyes.).   Yes Historical Provider, MD  diphenhydrAMINE (BENADRYL) 25 mg capsule Take 1 capsule (25 mg total) by mouth every 6 (six) hours as needed for itching. May take with Percocet PRN 10/17/14  Yes Donielle Liston Alba, PA-C  feeding supplement, ENSURE ENLIVE, (ENSURE ENLIVE) LIQD Take 237 mLs by mouth 2 (two) times daily between meals. 10/17/14  Yes Donielle Liston Alba, PA-C  HYDROcodone-acetaminophen (NORCO/VICODIN) 5-325 MG tablet Take 1 tablet by mouth every 6 (six) hours as needed for moderate pain. 11/01/14  Yes Eliezer Bottom, NP  hydrocortisone cream 1 % Apply 1 application topically 2 (two) times daily.    Yes Historical Provider, MD  lidocaine-prilocaine (EMLA) cream Apply 1 application topically as needed. Please dispense 2 tubes 10/12/14  Yes Volanda Napoleon, MD  metoprolol tartrate (LOPRESSOR) 25 MG tablet Take 0.5 tablets (12.5 mg total) by mouth 2 (two) times daily. 08/03/13  Yes Sherren Mocha, MD  ondansetron (ZOFRAN ODT) 4 MG disintegrating tablet Take 1 tablet (4 mg total) by mouth every 8 (eight) hours as needed for nausea or vomiting. 11/07/14  Yes Robbie Lis, MD  pantoprazole (PROTONIX) 40 MG tablet Take 1 tablet (40 mg total) by mouth 2 (two) times daily. 11/08/14  Yes Robbie Lis, MD  polyethylene glycol [facility] / Floria Raveling) packet Take 17 g by mouth daily. 11/08/14  Yes Robbie Lis, MD  potassium chloride SA (K-DUR,KLOR-CON) 20 MEQ tablet Take 1 tablet (20 mEq total) by mouth daily. 11/08/14  Yes Robbie Lis, MD  simvastatin (ZOCOR) 20 MG tablet Take 1 tablet (20 mg total) by mouth at bedtime. 03/30/14  Yes Sherren Mocha, MD  sucralfate (CARAFATE) 1 GM/10ML suspension Take 10 mLs (1  g total) by mouth 4 (four) times daily -  with meals and at bedtime. 11/08/14  Yes Robbie Lis, MD  temazepam (RESTORIL) 15 MG capsule Take 15 mg by mouth at bedtime as needed for sleep.   Yes Historical Provider, MD  famciclovir (FAMVIR) 500 MG tablet Take 1 tablet (500 mg total) by mouth daily. Patient not taking: Reported on 11/16/2014 10/27/14   Volanda Napoleon, MD  lidocaine (LIDODERM) 5 % Place 1 patch onto the skin daily. Remove & Discard patch within 12 hours or as directed by MD 10/31/14   Volanda Napoleon, MD  LORazepam (ATIVAN) 0.5 MG tablet Take 1 tablet (0.5 mg total) by mouth every 6 (six) hours as needed (Nausea or vomiting). 04/11/14   Volanda Napoleon, MD  nitroGLYCERIN (NITROSTAT) 0.4 MG SL tablet Place 1 tablet (0.4 mg total) under the tongue every 5 (five) minutes as needed for chest pain. 03/29/14   Sherren Mocha, MD  OLANZapine (ZYPREXA) 5 MG tablet Take 1 tablet (5 mg total) by  mouth daily. Patient not taking: Reported on 11/16/2014 11/08/14   Robbie Lis, MD  senna-docusate (SENOKOT-S) 8.6-50 MG tablet Take 1 tablet by mouth at bedtime. Patient not taking: Reported on 11/16/2014 11/08/14   Robbie Lis, MD  testosterone cypionate (DEPOTESTOTERONE CYPIONATE) 100 MG/ML injection Inject 400 mg into the muscle every 28 (twenty-eight) days. For IM use only    Historical Provider, MD  thiamine 100 MG tablet Take 1 tablet (100 mg total) by mouth daily. Patient not taking: Reported on 11/01/2014 10/17/14   Donielle Liston Alba, PA-C  traMADol (ULTRAM) 50 MG tablet Take 1 tablet (50 mg total) by mouth every 6 (six) hours as needed. Patient not taking: Reported on 11/16/2014 11/07/14   Robbie Lis, MD   BP 133/72 mmHg  Pulse 72  Temp(Src) 98.7 F (37.1 C) (Oral)  Resp 20  SpO2 99% Physical Exam  Constitutional: He is oriented to person, place, and time. He appears well-developed and well-nourished. He appears distressed.  HENT:  Head: Normocephalic and atraumatic.  Mouth/Throat: Oropharynx is clear and moist.  Eyes: Conjunctivae and EOM are normal. Pupils are equal, round, and reactive to light.  Neck: Normal range of motion.  Cardiovascular: Normal rate, regular rhythm and normal heart sounds.   No murmur heard. Pulmonary/Chest: Effort normal and breath sounds normal. No respiratory distress.  Abdominal: Soft. Bowel sounds are normal. There is no tenderness.  Musculoskeletal: Normal range of motion.  Neurological: He is alert and oriented to person, place, and time. No cranial nerve deficit. He exhibits normal muscle tone. Coordination normal.  Skin: Skin is warm. No rash noted.  Nursing note and vitals reviewed.   ED Course  Procedures (including critical care time) Labs Review Labs Reviewed  CBC WITH DIFFERENTIAL/PLATELET - Abnormal; Notable for the following:    WBC 2.5 (*)    RBC 2.47 (*)    Hemoglobin 7.2 (*)    HCT 21.3 (*)    Platelets 30 (*)     Neutro Abs 1.6 (*)    Lymphs Abs 0.5 (*)    All other components within normal limits  COMPREHENSIVE METABOLIC PANEL - Abnormal; Notable for the following:    Potassium 3.4 (*)    Glucose, Bld 127 (*)    AST 13 (*)    ALT 16 (*)    All other components within normal limits  LIPASE, BLOOD  PATHOLOGIST SMEAR REVIEW   Results for orders placed or performed  during the hospital encounter of 11/16/14  CBC with Differential/Platelet  Result Value Ref Range   WBC 2.5 (L) 4.0 - 10.5 K/uL   RBC 2.47 (L) 4.22 - 5.81 MIL/uL   Hemoglobin 7.2 (L) 13.0 - 17.0 g/dL   HCT 21.3 (L) 39.0 - 52.0 %   MCV 86.2 78.0 - 100.0 fL   MCH 29.1 26.0 - 34.0 pg   MCHC 33.8 30.0 - 36.0 g/dL   RDW 14.2 11.5 - 15.5 %   Platelets 30 (L) 150 - 400 K/uL   Neutrophils Relative % 65 %   Lymphocytes Relative 19 %   Monocytes Relative 16 %   Eosinophils Relative 0 %   Basophils Relative 0 %   Neutro Abs 1.6 (L) 1.7 - 7.7 K/uL   Lymphs Abs 0.5 (L) 0.7 - 4.0 K/uL   Monocytes Absolute 0.4 0.1 - 1.0 K/uL   Eosinophils Absolute 0.0 0.0 - 0.7 K/uL   Basophils Absolute 0.0 0.0 - 0.1 K/uL   WBC Morphology MILD LEFT SHIFT (1-5% METAS, OCC MYELO, OCC BANDS)   Comprehensive metabolic panel  Result Value Ref Range   Sodium 140 135 - 145 mmol/L   Potassium 3.4 (L) 3.5 - 5.1 mmol/L   Chloride 109 101 - 111 mmol/L   CO2 23 22 - 32 mmol/L   Glucose, Bld 127 (H) 65 - 99 mg/dL   BUN 20 6 - 20 mg/dL   Creatinine, Ser 1.18 0.61 - 1.24 mg/dL   Calcium 9.0 8.9 - 10.3 mg/dL   Total Protein 7.7 6.5 - 8.1 g/dL   Albumin 3.7 3.5 - 5.0 g/dL   AST 13 (L) 15 - 41 U/L   ALT 16 (L) 17 - 63 U/L   Alkaline Phosphatase 80 38 - 126 U/L   Total Bilirubin 0.9 0.3 - 1.2 mg/dL   GFR calc non Af Amer >60 >60 mL/min   GFR calc Af Amer >60 >60 mL/min   Anion gap 8 5 - 15  Lipase, blood  Result Value Ref Range   Lipase 34 11 - 51 U/L     Imaging Review Dg Abd Acute W/chest  11/16/2014  CLINICAL DATA:  Abdominal pain, nausea, and vomiting  for 8 hours. EXAM: DG ABDOMEN ACUTE W/ 1V CHEST COMPARISON:  11/02/2014 FINDINGS: Upper normal heart size. Pleural thickening over the left lung apex is stable. Right jugular Port-A-Cath tip in the lower SVC is stable. Right lung is clear. No pneumothorax. There is no free intraperitoneal gas. No disproportionate dilatation of bowel. IVC filter it aorto bi-iliac stent graft are in place. Splenomegaly is suspected. IMPRESSION: Stable pleural thickening at the left lung apex. Loculated pleural fluid or pleural mass are considerations. Nonobstructive bowel gas pattern in the abdomen. There is no free intraperitoneal gas. Splenomegaly. Electronically Signed   By: Marybelle Killings M.D.   On: 11/16/2014 21:04   I have personally reviewed and evaluated these images and lab results as part of my medical decision-making.   EKG Interpretation None      EKG would not cross over into MUSE  ED ECG REPORT   Date: 11/16/2014  Rate: 74  Rhythm: normal sinus rhythm  QRS Axis: normal  Intervals: normal  ST/T Wave abnormalities: normal  Conduction Disutrbances:none  Narrative Interpretation:   Old EKG Reviewed: none available  I have personally reviewed the EKG tracing and agree with the computerized printout as noted.    MDM   Final diagnoses:  Intractable cyclical vomiting with nausea  Patient with a recurrent episode of cyclical vomiting that started at 11:00 this morning. Here with Zofran and hydromorphone significant improvement in quick improvement. Acute abdominal series without any acute findings. Labs are pre-much baseline for patient. Patient was transfused just yesterday. For his blood cancer. Patient is being followed by GI medicine. Spouse will call in the morning 11 no how he did. Electrolytes are without significant abnormalities. Patient is improved enough that should be able to be discharged home will return for any recurrence. Patient redosed with hydromorphone and Zofran)  discharge.  As stated patient was significant improvement.    Fredia Sorrow, MD 11/16/14 [phone]

## 2014-11-16 NOTE — ED Notes (Signed)
Per EMS- Patient reports increasing GI issues for the past 3 months. Today, the patient has had N/V and abdominal pain since 1100 today. Patient reports that he was diagnosed with an esophageal ulcer yesterday. Patient was given a total of Zofran 8 mg Iv with no relief. EMS gave NS 400 ml prior to arrival to the ED.

## 2014-11-16 NOTE — Discharge Instructions (Signed)
Return for any new or worse symptoms. Follow-up with your GI doctor. Lab results provided.

## 2014-11-17 ENCOUNTER — Telehealth: Payer: Self-pay | Admitting: *Deleted

## 2014-11-17 ENCOUNTER — Emergency Department (HOSPITAL_COMMUNITY)
Admission: EM | Admit: 2014-11-17 | Discharge: 2014-11-17 | Disposition: A | Discharge status: Home / Self Care | Payer: Medicare Other | Source: Home / Self Care | Attending: Emergency Medicine | Admitting: Emergency Medicine

## 2014-11-17 ENCOUNTER — Emergency Department (HOSPITAL_COMMUNITY): Payer: Medicare Other

## 2014-11-17 ENCOUNTER — Encounter (HOSPITAL_COMMUNITY): Payer: Self-pay | Admitting: Emergency Medicine

## 2014-11-17 DIAGNOSIS — R112 Nausea with vomiting, unspecified: Secondary | ICD-10-CM

## 2014-11-17 DIAGNOSIS — D46Z Other myelodysplastic syndromes: Secondary | ICD-10-CM

## 2014-11-17 DIAGNOSIS — K219 Gastro-esophageal reflux disease without esophagitis: Secondary | ICD-10-CM

## 2014-11-17 LAB — COMPREHENSIVE METABOLIC PANEL
ALK PHOS: 79 U/L (ref 38–126)
ALT: 16 U/L — ABNORMAL LOW (ref 17–63)
ANION GAP: 9 (ref 5–15)
AST: 16 U/L (ref 15–41)
Albumin: 3.7 g/dL (ref 3.5–5.0)
BUN: 17 mg/dL (ref 6–20)
CALCIUM: 8.9 mg/dL (ref 8.9–10.3)
CO2: 22 mmol/L (ref 22–32)
Chloride: 108 mmol/L (ref 101–111)
Creatinine, Ser: 1.17 mg/dL (ref 0.61–1.24)
GFR calc non Af Amer: >60 mL/min (ref >60)
Glucose, Bld: 114 mg/dL — ABNORMAL HIGH (ref 65–99)
Potassium: 3.2 mmol/L — ABNORMAL LOW (ref 3.5–5.1)
SODIUM: 139 mmol/L (ref 135–145)
TOTAL PROTEIN: 7.5 g/dL (ref 6.5–8.1)
Total Bilirubin: 0.9 mg/dL (ref 0.3–1.2)

## 2014-11-17 LAB — URINE MICROSCOPIC-ADD ON

## 2014-11-17 LAB — URINALYSIS, ROUTINE W REFLEX MICROSCOPIC
GLUCOSE, UA: NEGATIVE mg/dL
HGB URINE DIPSTICK: NEGATIVE
Ketones, ur: NEGATIVE mg/dL
Leukocytes, UA: NEGATIVE
Nitrite: NEGATIVE
PH: 6 (ref 5.0–8.0)
Protein, ur: 30 mg/dL — AB
SPECIFIC GRAVITY, URINE: 1.025 (ref 1.005–1.030)
Urobilinogen, UA: 1 mg/dL (ref 0.0–1.0)

## 2014-11-17 LAB — CBC WITH DIFFERENTIAL/PLATELET
Basophils Absolute: 0 10*3/uL (ref 0.0–0.1)
Basophils Relative: 1 %
EOS ABS: 0 10*3/uL (ref 0.0–0.7)
EOS PCT: 0 %
HCT: 23.8 % — ABNORMAL LOW (ref 39.0–52.0)
Hemoglobin: 7.9 g/dL — ABNORMAL LOW (ref 13.0–17.0)
LYMPHS ABS: 0.5 10*3/uL — AB (ref 0.7–4.0)
Lymphocytes Relative: 26 %
MCH: 28.4 pg (ref 26.0–34.0)
MCHC: 33.2 g/dL (ref 30.0–36.0)
MCV: 85.6 fL (ref 78.0–100.0)
MONO ABS: 0.3 10*3/uL (ref 0.1–1.0)
MONOS PCT: 17 %
NEUTROS PCT: 57 %
Neutro Abs: 1.1 10*3/uL — ABNORMAL LOW (ref 1.7–7.7)
Platelets: 28 10*3/uL — CL (ref 150–400)
RBC: 2.78 MIL/uL — ABNORMAL LOW (ref 4.22–5.81)
RDW: 14.1 % (ref 11.5–15.5)
WBC: 1.9 10*3/uL — ABNORMAL LOW (ref 4.0–10.5)

## 2014-11-17 LAB — LIPASE, BLOOD: Lipase: 31 U/L (ref 11–51)

## 2014-11-17 LAB — PATHOLOGIST SMEAR REVIEW

## 2014-11-17 MED ORDER — HEPARIN SOD (PORK) LOCK FLUSH 100 UNIT/ML IV SOLN
500.0000 [IU] | Freq: Once | INTRAVENOUS | Status: AC
  Administered 2014-11-17: 500 [IU]
  Filled 2014-11-17: qty 5

## 2014-11-17 MED ORDER — HYDROMORPHONE HCL 1 MG/ML IJ SOLN
1.0000 mg | Freq: Once | INTRAMUSCULAR | Status: AC
  Administered 2014-11-17: 1 mg via INTRAVENOUS
  Filled 2014-11-17: qty 1

## 2014-11-17 MED ORDER — OXYCODONE HCL 5 MG PO TABS: 5.0000 mg | ORAL_TABLET | ORAL | Status: DC | PRN

## 2014-11-17 MED ORDER — POTASSIUM CHLORIDE CRYS ER 20 MEQ PO TBCR
40.0000 meq | EXTENDED_RELEASE_TABLET | Freq: Once | ORAL | Status: AC
  Administered 2014-11-17: 40 meq via ORAL
  Filled 2014-11-17: qty 2

## 2014-11-17 MED ORDER — PANCRELIPASE (LIP-PROT-AMYL) 36000-114000 UNITS PO CPEP: 36000.0000 [IU] | ORAL_CAPSULE | Freq: Three times a day (TID) | ORAL | Status: DC

## 2014-11-17 MED ORDER — POTASSIUM CHLORIDE 10 MEQ/100ML IV SOLN
10.0000 meq | Freq: Once | INTRAVENOUS | Status: AC
  Administered 2014-11-17: 10 meq via INTRAVENOUS
  Filled 2014-11-17: qty 100

## 2014-11-17 MED ORDER — POTASSIUM CHLORIDE CRYS ER 20 MEQ PO TBCR: 40.0000 meq | EXTENDED_RELEASE_TABLET | Freq: Once | ORAL | Status: DC

## 2014-11-17 MED ORDER — ONDANSETRON HCL 4 MG/2ML IJ SOLN
4.0000 mg | Freq: Once | INTRAMUSCULAR | Status: AC
  Administered 2014-11-17: 4 mg via INTRAVENOUS
  Filled 2014-11-17: qty 2

## 2014-11-17 MED ORDER — SODIUM CHLORIDE 0.9 % IV BOLUS (SEPSIS)
1000.0000 mL | Freq: Once | INTRAVENOUS | Status: AC
  Administered 2014-11-17: 1000 mL via INTRAVENOUS

## 2014-11-17 MED ORDER — GI COCKTAIL ~~LOC~~
30.0000 mL | Freq: Once | ORAL | Status: AC
  Administered 2014-11-17: 30 mL via ORAL
  Filled 2014-11-17: qty 30

## 2014-11-17 MED ORDER — IOHEXOL 300 MG/ML  SOLN
100.0000 mL | Freq: Once | INTRAMUSCULAR | Status: AC | PRN
  Administered 2014-11-17: 100 mL via INTRAVENOUS

## 2014-11-17 MED ORDER — LIDOCAINE VISCOUS 2 % MT SOLN: 20.0000 mL | Freq: Four times a day (QID) | OROMUCOSAL | Status: DC | PRN

## 2014-11-17 NOTE — Telephone Encounter (Signed)
Wife stating that she brought patient to the ED yesterday after he had a difficult episode of acid reflux. In the ED he got Dilaudid, Zofran and Protonix IV. He felt much better upon his discharge from the ED. This morning he woke up, once again with severe acid reflux. Wife is wondering what else can be prescribed to help with this. Spoke to Dr Marin Olp who would like to try the patient on Creon. Prescription sent and wife notified.

## 2014-11-17 NOTE — ED Notes (Signed)
Pt wants blood taken from port.  

## 2014-11-17 NOTE — ED Notes (Signed)
Pt was seen yesterday for NV and abd pain.  States he got better when he left but started vomiting again this morning.  Gen abd pain, sharp stabbing pains.

## 2014-11-17 NOTE — ED Notes (Signed)
Clarification for Lidocaine solution, [place], directed to have patient gargle and spit out solution as directed on prescription.

## 2014-11-17 NOTE — ED Provider Notes (Signed)
CSN: [id number]     Arrival date & time 11/17/14  1644 History   First MD Initiated Contact with Patient 11/17/14 1710     Chief Complaint  Patient presents with  . Nausea  . Emesis     (Consider location/radiation/quality/duration/timing/severity/associated sxs/prior Treatment) Patient is a 69 y.o. male presenting with vomiting.  Emesis Severity:  Mild Timing:  Constant Number of daily episodes:  Many Quality:  Stomach contents Chronicity:  Recurrent Recent urination:  Normal Worsened by:  Nothing tried Ineffective treatments:  None tried Associated symptoms: abdominal pain, chills and fever   Associated symptoms: no diarrhea, no myalgias and no sore throat   Risk factors: no alcohol use     Past Medical History  Diagnosis Date  . Coronary atherosclerosis of native coronary artery     a. 10/2008 inf STEMI/PCI: LM 50d (IVUS-borderline lesion->med rx), LAD min irregs, LCX 10m 70d, OM nl, RCA 1065m3.5x28 [facility] BMS);  b. 11/2008 Lexiscan MV: EF 65%, no isch/scar.  . Essential hypertension, benign   . Pure hypercholesterolemia   . [place]    a. 05[place]2013 CT: 5.8 cm AAA.  . Marland Kitcheniverticulitis     a. 05/2013 CT: descending/sigmoid jxn w/o abscess.  . Osteoarthritis   . Tobacco abuse     a. ongoing - 1ppd for better part of 50 yrs.  . Normocytic anemia   . Cellulitis 06/10/2013    Right antecubital fossa at site of IV  03/12/13  . PONV (postoperative nausea and vomiting)   . Diverticulitis   . GERD (gastroesophageal reflux disease)   . MDS (myelodysplastic syndrome), high grade ([facility]3/30/2016  . Myocardial infarction ([place]2010  . Depression   . Spontaneous pneumothorax 09/14/2014    LEFT LUNG  . Hypotestosteronemia 10/04/2014  . Esophageal ulcer    Past Surgical History  Procedure Laterality Date  . Cardiac stents  2010  . Cholecystectomy N/A 06/19/2013    Procedure: LAPAROSCOPIC CHOLECYSTECTOMY;  Surgeon: DoHarl BowieMD;  [facility];  Laterality: N/A;  . Abdominal aortic endovascular stent graft N/A 07/02/2013    Procedure: ABDOMINAL AORTIC ENDOVASCULAR STENT GRAFT;  Surgeon: VaSerafina MitchellMD;  [facility];  Service: Vascular;  Laterality: N/A;  . Esophagogastroduodenoscopy N/A 08/05/2013    Procedure: ESOPHAGOGASTRODUODENOSCOPY (EGD);  Surgeon: ViLear NgMD;  [facility];  Service: Endoscopy;  Laterality: N/A;  . Eye surgery Left     cataract surgery  . Back surgery      cervical fusion  . Colonoscopy with propofol N/A 10/15/2013    Procedure: COLONOSCOPY WITH PROPOFOL;  Surgeon: ViLear NgMD;  [facility]: [place];  Laterality: N/A;  . Fetal blood transfusion  March 16,17,18, 2016  . Bone marrow biopsy  April 01, 2014  . Chest tube insertion  09/14/2014  . Video assisted thoracoscopy Left 10/11/2014    Procedure: VIDEO ASSISTED THORACOSCOPY;  Surgeon: PeIvin PootMD;  [place] [facility] Service: Thoracic;  Laterality: Left;  . Stapling of blebs Left 10/11/2014    Procedure: STAPLING OF BLEBS;  Surgeon: PeIvin PootMD;  [facility];  Service: Thoracic;  Laterality: Left;  . Esophagogastroduodenoscopy N/A 11/04/2014    Procedure: ESOPHAGOGASTRODUODENOSCOPY (EGD);  Surgeon: JaLaurence SpatesMD;  [facility];  Service: Endoscopy;  Laterality: N/A;   Family History  Problem Relation Age of Onset  . Heart attack Brother     6069s.  Cancer Father     Lung  . Cancer Mother     Brain  . Cancer Brother     Kidney   Social History  Substance Use Topics  . Smoking status: Former Smoker -- 1.00 packs/day for 60 years    Types: Cigarettes    Start date: 04/05/1964  . Smokeless tobacco: Never Used     Comment: 09/14/2014  . Alcohol Use: No    Review of Systems  Constitutional: Positive for chills.  HENT: Negative for sore throat.   Gastrointestinal: Positive for vomiting and abdominal pain. Negative for diarrhea.   Musculoskeletal: Negative for myalgias.  All other systems reviewed and are negative.     Allergies  Albuterol-ipratropium-soybean lecithin; Cephalexin; Compazine; Percocet; Phenergan; Tramadol; Vicodin; Ativan; Marinol; and Reglan  Home Medications   Prior to Admission medications   Medication Sig Start Date End Date Taking? Authorizing Provider  acetaminophen (TYLENOL) 500 MG tablet Take 500 mg by mouth every 6 (six) hours as needed for fever.   Yes Historical Provider, MD  ALPRAZolam (XANAX) 0.25 MG tablet Take 1 tablet (0.25 mg total) by mouth 3 (three) times daily as needed for anxiety. 10/03/14  Yes Volanda Napoleon, MD  Benzocaine (BOIL EASE MAXIMUM STRENGTH) 20 % OINT Apply 1 application topically as needed (shingles pain).   Yes Historical Provider, MD  carboxymethylcellulose (REFRESH TEARS) 0.5 % SOLN Place 2 drops into both eyes 3 (three) times daily as needed (For dry eyes.).   Yes Historical Provider, MD  diphenhydrAMINE (BENADRYL) 25 mg capsule Take 1 capsule (25 mg total) by mouth every 6 (six) hours as needed for itching. May take with Percocet PRN 10/17/14  Yes Donielle Liston Alba, PA-C  feeding supplement, ENSURE ENLIVE, (ENSURE ENLIVE) LIQD Take 237 mLs by mouth 2 (two) times daily between meals. 10/17/14  Yes Donielle Liston Alba, PA-C  HYDROcodone-acetaminophen (NORCO/VICODIN) 5-325 MG tablet Take 1 tablet by mouth every 6 (six) hours as needed for moderate pain. 11/01/14  Yes Eliezer Bottom, NP  lidocaine (LIDODERM) 5 % Place 1 patch onto the skin daily. Remove & Discard patch within 12 hours or as directed by MD 10/31/14  Yes Volanda Napoleon, MD  lidocaine-prilocaine (EMLA) cream Apply 1 application topically as needed. Please dispense 2 tubes 10/12/14  Yes Volanda Napoleon, MD  lipase/protease/amylase (CREON) 36000 UNITS CPEP capsule Take 1 capsule (36,000 Units total) by mouth 3 (three) times daily before meals. 11/17/14  Yes Volanda Napoleon, MD  LORazepam (ATIVAN) 0.5  MG tablet Take 1 tablet (0.5 mg total) by mouth every 6 (six) hours as needed (Nausea or vomiting). 04/11/14  Yes Volanda Napoleon, MD  metoprolol tartrate (LOPRESSOR) 25 MG tablet Take 0.5 tablets (12.5 mg total) by mouth 2 (two) times daily. 08/03/13  Yes Sherren Mocha, MD  nitroGLYCERIN (NITROSTAT) 0.4 MG SL tablet Place 1 tablet (0.4 mg total) under the tongue every 5 (five) minutes as needed for chest pain. 03/29/14  Yes Sherren Mocha, MD  OLANZapine (ZYPREXA) 5 MG tablet Take 1 tablet (5 mg total) by mouth daily. 11/08/14  Yes Robbie Lis, MD  ondansetron (ZOFRAN ODT) 4 MG disintegrating tablet Take 1 tablet (4 mg total) by mouth every 8 (eight) hours as needed for nausea or vomiting. 11/07/14  Yes Robbie Lis, MD  pantoprazole (PROTONIX) 40 MG tablet Take 1 tablet (40 mg total) by mouth 2 (two) times daily. 11/08/14  Yes Robbie Lis, MD  polyethylene glycol [facility] / Floria Raveling) packet  Take 17 g by mouth daily. Patient taking differently: Take 17 g by mouth daily as needed for moderate constipation.  11/08/14  Yes Robbie Lis, MD  potassium chloride SA (K-DUR,KLOR-CON) 20 MEQ tablet Take 1 tablet (20 mEq total) by mouth daily. 11/08/14  Yes Robbie Lis, MD  senna-docusate (SENOKOT-S) 8.6-50 MG tablet Take 1 tablet by mouth at bedtime. 11/08/14  Yes Robbie Lis, MD  simvastatin (ZOCOR) 20 MG tablet Take 1 tablet (20 mg total) by mouth at bedtime. 03/30/14  Yes Sherren Mocha, MD  sucralfate (CARAFATE) 1 GM/10ML suspension Take 10 mLs (1 g total) by mouth 4 (four) times daily -  with meals and at bedtime. 11/08/14  Yes Robbie Lis, MD  temazepam (RESTORIL) 15 MG capsule Take 15 mg by mouth at bedtime as needed for sleep.   Yes Historical Provider, MD  testosterone cypionate (DEPOTESTOTERONE CYPIONATE) 100 MG/ML injection Inject 400 mg into the muscle every 28 (twenty-eight) days. For IM use only   Yes Historical Provider, MD  famciclovir (FAMVIR) 500 MG tablet Take 1 tablet (500 mg  total) by mouth daily. Patient not taking: Reported on 11/16/2014 10/27/14   Volanda Napoleon, MD  thiamine 100 MG tablet Take 1 tablet (100 mg total) by mouth daily. Patient not taking: Reported on 11/01/2014 10/17/14   Donielle Liston Alba, PA-C  traMADol (ULTRAM) 50 MG tablet Take 1 tablet (50 mg total) by mouth every 6 (six) hours as needed. Patient not taking: Reported on 11/16/2014 11/07/14   Robbie Lis, MD   BP 121/63 mmHg  Pulse 68  Temp(Src) 97.9 F (36.6 C) (Oral)  Resp 16  SpO2 100% Physical Exam  Constitutional: He appears well-developed and well-nourished.  HENT:  Head: Normocephalic and atraumatic.  Neck: Normal range of motion.  Cardiovascular: Normal rate.   Pulmonary/Chest: Effort normal. No respiratory distress.  Abdominal: He exhibits no distension.  Musculoskeletal: Normal range of motion.  Neurological: He is alert.  Nursing note and vitals reviewed.   ED Course  Procedures (including critical care time) Labs Review Labs Reviewed - No data to display  Imaging Review Dg Abd Acute W/chest  11/16/2014  CLINICAL DATA:  Abdominal pain, nausea, and vomiting for 8 hours. EXAM: DG ABDOMEN ACUTE W/ 1V CHEST COMPARISON:  11/02/2014 FINDINGS: Upper normal heart size. Pleural thickening over the left lung apex is stable. Right jugular Port-A-Cath tip in the lower SVC is stable. Right lung is clear. No pneumothorax. There is no free intraperitoneal gas. No disproportionate dilatation of bowel. IVC filter it aorto bi-iliac stent graft are in place. Splenomegaly is suspected. IMPRESSION: Stable pleural thickening at the left lung apex. Loculated pleural fluid or pleural mass are considerations. Nonobstructive bowel gas pattern in the abdomen. There is no free intraperitoneal gas. Splenomegaly. Electronically Signed   By: Marybelle Killings M.D.   On: 11/16/2014 21:04   I have personally reviewed and evaluated these images and lab results as part of my medical decision-making.    EKG Interpretation None      MDM   Final diagnoses:  Non-intractable vomiting with nausea, vomiting of unspecified type   N/v, similar to previous, seen day prior for same. Abdomen benign. VS reassurring. Symptoms resolve quickly after IV anti-emetics and narcotics. Unsure of cause. Possibly opioid dependence related? Stable while in ED.  SCan negative for acute abnromalities or obstruction. Tolerating PO. Will dc to fu w/ GI (already has been in contact) for possible manometry or other testing.  I have personally and contemperaneously reviewed labs and imaging and used in my decision making as above.   A medical screening exam was performed and I feel the patient has had an appropriate workup for their chief complaint at this time and likelihood of emergent condition existing is low. They have been counseled on decision, discharge, follow up and which symptoms necessitate immediate return to the emergency department. They or their family verbally stated understanding and agreement with plan and discharged in stable condition.      Merrily Pew, MD 11/22/14 [phone]

## 2014-11-18 ENCOUNTER — Other Ambulatory Visit: Payer: Self-pay

## 2014-11-18 ENCOUNTER — Encounter: Payer: Self-pay | Admitting: Hematology & Oncology

## 2014-11-18 DIAGNOSIS — D46Z Other myelodysplastic syndromes: Secondary | ICD-10-CM

## 2014-11-20 ENCOUNTER — Encounter: Payer: Self-pay | Admitting: Hematology & Oncology

## 2014-11-20 ENCOUNTER — Emergency Department (HOSPITAL_COMMUNITY)
Admission: EM | Admit: 2014-11-20 | Discharge: 2014-11-20 | Disposition: A | Discharge status: Home / Self Care | Payer: Medicare Other | Attending: Physician Assistant | Admitting: Physician Assistant

## 2014-11-20 ENCOUNTER — Encounter (HOSPITAL_COMMUNITY): Payer: Self-pay | Admitting: Emergency Medicine

## 2014-11-20 DIAGNOSIS — Z8709 Personal history of other diseases of the respiratory system: Secondary | ICD-10-CM | POA: Insufficient documentation

## 2014-11-20 DIAGNOSIS — M1990 Unspecified osteoarthritis, unspecified site: Secondary | ICD-10-CM | POA: Insufficient documentation

## 2014-11-20 DIAGNOSIS — R1012 Left upper quadrant pain: Secondary | ICD-10-CM | POA: Insufficient documentation

## 2014-11-20 DIAGNOSIS — R1011 Right upper quadrant pain: Secondary | ICD-10-CM | POA: Diagnosis not present

## 2014-11-20 DIAGNOSIS — R1013 Epigastric pain: Secondary | ICD-10-CM | POA: Diagnosis not present

## 2014-11-20 DIAGNOSIS — F329 Major depressive disorder, single episode, unspecified: Secondary | ICD-10-CM | POA: Insufficient documentation

## 2014-11-20 DIAGNOSIS — Z87891 Personal history of nicotine dependence: Secondary | ICD-10-CM | POA: Insufficient documentation

## 2014-11-20 DIAGNOSIS — I252 Old myocardial infarction: Secondary | ICD-10-CM | POA: Insufficient documentation

## 2014-11-20 DIAGNOSIS — Z872 Personal history of diseases of the skin and subcutaneous tissue: Secondary | ICD-10-CM | POA: Insufficient documentation

## 2014-11-20 DIAGNOSIS — R231 Pallor: Secondary | ICD-10-CM | POA: Insufficient documentation

## 2014-11-20 DIAGNOSIS — R61 Generalized hyperhidrosis: Secondary | ICD-10-CM | POA: Insufficient documentation

## 2014-11-20 DIAGNOSIS — E7800 Pure hypercholesterolemia, unspecified: Secondary | ICD-10-CM | POA: Insufficient documentation

## 2014-11-20 DIAGNOSIS — Z79899 Other long term (current) drug therapy: Secondary | ICD-10-CM | POA: Diagnosis not present

## 2014-11-20 DIAGNOSIS — K219 Gastro-esophageal reflux disease without esophagitis: Secondary | ICD-10-CM | POA: Diagnosis not present

## 2014-11-20 DIAGNOSIS — R112 Nausea with vomiting, unspecified: Secondary | ICD-10-CM | POA: Diagnosis not present

## 2014-11-20 DIAGNOSIS — I2510 Atherosclerotic heart disease of native coronary artery without angina pectoris: Secondary | ICD-10-CM | POA: Insufficient documentation

## 2014-11-20 DIAGNOSIS — Z862 Personal history of diseases of the blood and blood-forming organs and certain disorders involving the immune mechanism: Secondary | ICD-10-CM | POA: Diagnosis not present

## 2014-11-20 DIAGNOSIS — Z9861 Coronary angioplasty status: Secondary | ICD-10-CM | POA: Insufficient documentation

## 2014-11-20 DIAGNOSIS — R1110 Vomiting, unspecified: Secondary | ICD-10-CM

## 2014-11-20 DIAGNOSIS — I10 Essential (primary) hypertension: Secondary | ICD-10-CM | POA: Diagnosis not present

## 2014-11-20 LAB — COMPREHENSIVE METABOLIC PANEL
ALK PHOS: 100 U/L (ref 38–126)
ALT: 19 U/L (ref 17–63)
ANION GAP: 10 (ref 5–15)
AST: 17 U/L (ref 15–41)
Albumin: 4 g/dL (ref 3.5–5.0)
BUN: 18 mg/dL (ref 6–20)
CALCIUM: 9.1 mg/dL (ref 8.9–10.3)
CO2: 23 mmol/L (ref 22–32)
CREATININE: 1.19 mg/dL (ref 0.61–1.24)
Chloride: 108 mmol/L (ref 101–111)
Glucose, Bld: 135 mg/dL — ABNORMAL HIGH (ref 65–99)
Potassium: 3.3 mmol/L — ABNORMAL LOW (ref 3.5–5.1)
SODIUM: 141 mmol/L (ref 135–145)
Total Bilirubin: 0.8 mg/dL (ref 0.3–1.2)
Total Protein: 7.8 g/dL (ref 6.5–8.1)

## 2014-11-20 LAB — CBC
HCT: 25 % — ABNORMAL LOW (ref 39.0–52.0)
HEMOGLOBIN: 8.4 g/dL — AB (ref 13.0–17.0)
MCH: 28.8 pg (ref 26.0–34.0)
MCHC: 33.6 g/dL (ref 30.0–36.0)
MCV: 85.6 fL (ref 78.0–100.0)
PLATELETS: 42 10*3/uL — AB (ref 150–400)
RBC: 2.92 MIL/uL — AB (ref 4.22–5.81)
RDW: 14.3 % (ref 11.5–15.5)
WBC: 1.7 10*3/uL — AB (ref 4.0–10.5)

## 2014-11-20 LAB — LIPASE, BLOOD: LIPASE: 30 U/L (ref 11–51)

## 2014-11-20 MED ORDER — SODIUM CHLORIDE 0.9 % IV SOLN
8.0000 mg | Freq: Once | INTRAVENOUS | Status: AC
  Administered 2014-11-20: 8 mg via INTRAVENOUS
  Filled 2014-11-20: qty 4

## 2014-11-20 MED ORDER — GI COCKTAIL ~~LOC~~
30.0000 mL | Freq: Once | ORAL | Status: AC
  Administered 2014-11-20: 30 mL via ORAL
  Filled 2014-11-20: qty 30

## 2014-11-20 MED ORDER — FAMOTIDINE IN NACL 20-0.9 MG/50ML-% IV SOLN
20.0000 mg | Freq: Once | INTRAVENOUS | Status: AC
  Administered 2014-11-20: 20 mg via INTRAVENOUS
  Filled 2014-11-20: qty 50

## 2014-11-20 MED ORDER — ONDANSETRON HCL 4 MG/2ML IJ SOLN
4.0000 mg | Freq: Once | INTRAMUSCULAR | Status: AC | PRN
  Administered 2014-11-20: 4 mg via INTRAVENOUS
  Filled 2014-11-20: qty 2

## 2014-11-20 MED ORDER — SODIUM CHLORIDE 0.9 % IV BOLUS (SEPSIS)
1000.0000 mL | Freq: Once | INTRAVENOUS | Status: AC
  Administered 2014-11-20: 1000 mL via INTRAVENOUS

## 2014-11-20 MED ORDER — LORAZEPAM 2 MG/ML IJ SOLN
1.0000 mg | Freq: Once | INTRAMUSCULAR | Status: AC
  Administered 2014-11-20: 1 mg via INTRAVENOUS
  Filled 2014-11-20: qty 1

## 2014-11-20 MED ORDER — HEPARIN SOD (PORK) LOCK FLUSH 100 UNIT/ML IV SOLN
500.0000 [IU] | Freq: Once | INTRAVENOUS | Status: AC
  Administered 2014-11-20: 500 [IU]
  Filled 2014-11-20: qty 5

## 2014-11-20 MED ORDER — LORAZEPAM 1 MG PO TABS: 1.0000 mg | ORAL_TABLET | Freq: Two times a day (BID) | ORAL | Status: DC

## 2014-11-20 NOTE — ED Notes (Signed)
Patient was alert, oriented and stable upon discharge. RN went over AVS and patient had no further questions.  

## 2014-11-20 NOTE — ED Notes (Signed)
MD and PA at bedside going over lab results and plan of care.

## 2014-11-20 NOTE — Discharge Instructions (Signed)
Please follow-up with your care regular physician and GI physician.  Abdominal Pain, Adult Many things can cause abdominal pain. Usually, abdominal pain is not caused by a disease and will improve without treatment. It can often be observed and treated at home. Your health care provider will do a physical exam and possibly order blood tests and X-rays to help determine the seriousness of your pain. However, in many cases, more time must pass before a clear cause of the pain can be found. Before that point, your health care provider may not know if you need more testing or further treatment. HOME CARE INSTRUCTIONS Monitor your abdominal pain for any changes. The following actions may help to alleviate any discomfort you are experiencing:  Only take over-the-counter or prescription medicines as directed by your health care provider.  Do not take laxatives unless directed to do so by your health care provider.  Try a clear liquid diet (broth, tea, or water) as directed by your health care provider. Slowly move to a bland diet as tolerated. SEEK MEDICAL CARE IF:  You have unexplained abdominal pain.  You have abdominal pain associated with nausea or diarrhea.  You have pain when you urinate or have a bowel movement.  You experience abdominal pain that wakes you in the night.  You have abdominal pain that is worsened or improved by eating food.  You have abdominal pain that is worsened with eating fatty foods.  You have a fever. SEEK IMMEDIATE MEDICAL CARE IF:  Your pain does not go away within 2 hours.  You keep throwing up (vomiting).  Your pain is felt only in portions of the abdomen, such as the right side or the left lower portion of the abdomen.  You pass bloody or black tarry stools. MAKE SURE YOU:  Understand these instructions.  Will watch your condition.  Will get help right away if you are not doing well or get worse.   This information is not intended to replace  advice given to you by your health care provider. Make sure you discuss any questions you have with your health care provider.   Document Released: 10/10/2004 Document Revised: 09/21/2014 Document Reviewed: 09/09/2012 Elsevier Interactive Patient Education [employer].

## 2014-11-20 NOTE — ED Notes (Signed)
Bed: AS34 Expected date:  Expected time:  Means of arrival:  Comments: [id number]

## 2014-11-20 NOTE — ED Provider Notes (Signed)
CSN: [id number]     Arrival date & time 11/20/14  1240 History   First MD Initiated Contact with Patient 11/20/14 1429     Chief Complaint  Patient presents with  . Emesis     (Consider location/radiation/quality/duration/timing/severity/associated sxs/prior Treatment) HPI   [name] is a 69 y.o. male, with a history of cholecystectomy, MDS, and MI, presenting to the ED with N/V beginning this past Wednesday (5 days) accompanied by epigastric abdominal pain. Pt has been seen at the ED twice since then, given pain medication and zofran, and sent home. Pt states he begins vomiting as soon as he leaves the ED.  Pt was receiving treatment for his MDS, but his last treatment was Oct 18. Pt also complains of a burning in his throat. Pain in abdomen is sharp, 10/10, radiating to his RUQ. Denies blood or stool in his vomit. Denies diarrhea, fever/chills, chest pain, shortness of breath, LOC, or any other complaints. Pt is accompanied by his wife at the bedside.    Past Medical History  Diagnosis Date  . Coronary atherosclerosis of native coronary artery     a. 10/2008 inf STEMI/PCI: LM 50d (IVUS-borderline lesion->med rx), LAD min irregs, LCX 34m, 70d, OM nl, RCA 146m (3.5x28 Vision BMS);  b. 11/2008 Lexiscan MV: EF 65%, no isch/scar.  . Essential hypertension, benign   . Pure hypercholesterolemia   . AAA (abdominal aortic aneurysm) ([place])     a. 05/2013 CT: 5.8 cm AAA.  Marland Kitchen Diverticulitis     a. 05/2013 CT: descending/sigmoid jxn w/o abscess.  . Osteoarthritis   . Tobacco abuse     a. ongoing - 1ppd for better part of 50 yrs.  . Normocytic anemia   . Cellulitis 06/10/2013    Right antecubital fossa at site of IV  03/12/13  . PONV (postoperative nausea and vomiting)   . Diverticulitis   . GERD (gastroesophageal reflux disease)   . MDS (myelodysplastic syndrome), high grade ([place]) 04/13/2014  . Myocardial infarction ([place]) 2010  . Depression   . Spontaneous pneumothorax 09/14/2014    LEFT  LUNG  . Hypotestosteronemia 10/04/2014  . Esophageal ulcer    Past Surgical History  Procedure Laterality Date  . Cardiac stents  2010  . Cholecystectomy N/A 06/19/2013    Procedure: LAPAROSCOPIC CHOLECYSTECTOMY;  Surgeon: Harl Bowie, MD;  Location: [facility];  Service: General;  Laterality: N/A;  . Abdominal aortic endovascular stent graft N/A 07/02/2013    Procedure: ABDOMINAL AORTIC ENDOVASCULAR STENT GRAFT;  Surgeon: Serafina Mitchell, MD;  Location: [facility] OR;  Service: Vascular;  Laterality: N/A;  . Esophagogastroduodenoscopy N/A 08/05/2013    Procedure: ESOPHAGOGASTRODUODENOSCOPY (EGD);  Surgeon: Lear Ng, MD;  Location: [facility] ENDOSCOPY;  Service: Endoscopy;  Laterality: N/A;  . Eye surgery Left     cataract surgery  . Back surgery      cervical fusion  . Colonoscopy with propofol N/A 10/15/2013    Procedure: COLONOSCOPY WITH PROPOFOL;  Surgeon: Lear Ng, MD;  Location: [place];  Service: Endoscopy;  Laterality: N/A;  . Fetal blood transfusion  March 16,17,18, 2016  . Bone marrow biopsy  April 01, 2014  . Chest tube insertion  09/14/2014  . Video assisted thoracoscopy Left 10/11/2014    Procedure: VIDEO ASSISTED THORACOSCOPY;  Surgeon: Ivin Poot, MD;  Location: [place];  Service: Thoracic;  Laterality: Left;  . Stapling of blebs Left 10/11/2014    Procedure: STAPLING OF BLEBS;  Surgeon: Ivin Poot, MD;  Location: [facility] OR;  Service: Thoracic;  Laterality: Left;  . Esophagogastroduodenoscopy N/A 11/04/2014    Procedure: ESOPHAGOGASTRODUODENOSCOPY (EGD);  Surgeon: Laurence Spates, MD;  Location: [facility] ENDOSCOPY;  Service: Endoscopy;  Laterality: N/A;   Family History  Problem Relation Age of Onset  . Heart attack Brother     71s  . Cancer Father     Lung  . Cancer Mother     Brain  . Cancer Brother     Kidney   Social History  Substance Use Topics  . Smoking status: Former Smoker -- 1.00 packs/day for 60 years    Types: Cigarettes    Start date:  04/05/1964  . Smokeless tobacco: Never Used     Comment: 09/14/2014  . Alcohol Use: No    Review of Systems  Constitutional: Negative for fever, chills, diaphoresis and unexpected weight change.  Respiratory: Negative for cough, chest tightness and shortness of breath.   Cardiovascular: Negative for chest pain, palpitations and leg swelling.  Gastrointestinal: Positive for nausea and vomiting. Negative for abdominal pain, diarrhea and constipation.  Genitourinary: Negative for dysuria and flank pain.  Musculoskeletal: Negative for back pain.  Skin: Negative for color change and pallor.  Neurological: Negative for dizziness, syncope, weakness and light-headedness.  All other systems reviewed and are negative.     Allergies  Albuterol-ipratropium-soybean lecithin; Cephalexin; Compazine; Percocet; Phenergan; Tramadol; Vicodin; Ativan; Marinol; and Reglan  Home Medications   Prior to Admission medications   Medication Sig Start Date End Date Taking? Authorizing Provider  acetaminophen (TYLENOL) 500 MG tablet Take 500 mg by mouth every 6 (six) hours as needed for fever.   Yes Historical Provider, MD  ALPRAZolam (XANAX) 0.25 MG tablet Take 1 tablet (0.25 mg total) by mouth 3 (three) times daily as needed for anxiety. 10/03/14  Yes Volanda Napoleon, MD  Benzocaine (BOIL EASE MAXIMUM STRENGTH) 20 % OINT Apply 1 application topically as needed (shingles pain).   Yes Historical Provider, MD  carboxymethylcellulose (REFRESH TEARS) 0.5 % SOLN Place 2 drops into both eyes 3 (three) times daily as needed (For dry eyes.).   Yes Historical Provider, MD  DEXILANT 60 MG capsule Take 60 mg by mouth daily. 11/20/14  Yes Historical Provider, MD  diphenhydrAMINE (BENADRYL) 25 mg capsule Take 1 capsule (25 mg total) by mouth every 6 (six) hours as needed for itching. May take with Percocet PRN 10/17/14  Yes Donielle Liston Alba, PA-C  feeding supplement, ENSURE ENLIVE, (ENSURE ENLIVE) LIQD Take 237 mLs by  mouth 2 (two) times daily between meals. 10/17/14  Yes Donielle Liston Alba, PA-C  Ginger, Zingiber officinalis, (GINGER ROOT PO) Take 1 capsule by mouth once.   Yes Historical Provider, MD  HYDROcodone-acetaminophen (NORCO/VICODIN) 5-325 MG tablet Take 1 tablet by mouth every 6 (six) hours as needed for moderate pain. 11/01/14  Yes Eliezer Bottom, NP  lidocaine (LIDODERM) 5 % Place 1 patch onto the skin daily. Remove & Discard patch within 12 hours or as directed by MD 10/31/14  Yes Volanda Napoleon, MD  lidocaine (XYLOCAINE) 2 % solution Use as directed 20 mLs in the mouth or throat every 6 (six) hours as needed for mouth pain. 11/17/14  Yes Merrily Pew, MD  lidocaine-prilocaine (EMLA) cream Apply 1 application topically as needed. Please dispense 2 tubes 10/12/14  Yes Volanda Napoleon, MD  lipase/protease/amylase (CREON) 36000 UNITS CPEP capsule Take 1 capsule (36,000 Units total) by mouth 3 (three) times daily before meals. 11/17/14  Yes Volanda Napoleon,  MD  LORazepam (ATIVAN) 0.5 MG tablet Take 1 tablet (0.5 mg total) by mouth every 6 (six) hours as needed (Nausea or vomiting). 04/11/14  Yes Josph Macho, MD  metoprolol tartrate (LOPRESSOR) 25 MG tablet Take 0.5 tablets (12.5 mg total) by mouth 2 (two) times daily. 08/03/13  Yes Tonny Bollman, MD  nitroGLYCERIN (NITROSTAT) 0.4 MG SL tablet Place 1 tablet (0.4 mg total) under the tongue every 5 (five) minutes as needed for chest pain. 03/29/14  Yes Tonny Bollman, MD  OLANZapine (ZYPREXA) 5 MG tablet Take 1 tablet (5 mg total) by mouth daily. 11/08/14  Yes Alison Murray, MD  ondansetron (ZOFRAN ODT) 4 MG disintegrating tablet Take 1 tablet (4 mg total) by mouth every 8 (eight) hours as needed for nausea or vomiting. 11/07/14  Yes Alison Murray, MD  oxyCODONE (ROXICODONE) 5 MG immediate release tablet Take 1-2 tablets (5-10 mg total) by mouth every 4 (four) hours as needed for severe pain. 11/17/14  Yes Marily Memos, MD  oxyCODONE-acetaminophen  (PERCOCET) 7.5-325 MG tablet Take 1 tablet by mouth every 4 (four) hours as needed for moderate pain or severe pain.   Yes Historical Provider, MD  polyethylene glycol (MIRALAX / GLYCOLAX) packet Take 17 g by mouth daily. Patient taking differently: Take 17 g by mouth daily as needed for moderate constipation.  11/08/14  Yes Alison Murray, MD  potassium chloride SA (K-DUR,KLOR-CON) 20 MEQ tablet Take 2 tablets (40 mEq total) by mouth once. Patient taking differently: Take 20 mEq by mouth 2 (two) times daily.  11/17/14  Yes Marily Memos, MD  ranitidine (ZANTAC) 150 MG tablet Take 150 mg by mouth 2 (two) times daily.   Yes Historical Provider, MD  senna-docusate (SENOKOT-S) 8.6-50 MG tablet Take 1 tablet by mouth at bedtime. 11/08/14  Yes Alison Murray, MD  simvastatin (ZOCOR) 20 MG tablet Take 1 tablet (20 mg total) by mouth at bedtime. 03/30/14  Yes Tonny Bollman, MD  sucralfate (CARAFATE) 1 GM/10ML suspension Take 10 mLs (1 g total) by mouth 4 (four) times daily -  with meals and at bedtime. 11/08/14  Yes Alison Murray, MD  temazepam (RESTORIL) 15 MG capsule Take 15 mg by mouth at bedtime as needed for sleep.   Yes Historical Provider, MD  testosterone cypionate (DEPOTESTOTERONE CYPIONATE) 100 MG/ML injection Inject 400 mg into the muscle every 28 (twenty-eight) days. For IM use only   Yes Historical Provider, MD  pantoprazole (PROTONIX) 40 MG tablet Take 1 tablet (40 mg total) by mouth 2 (two) times daily. Patient not taking: Reported on 11/20/2014 11/08/14   Alison Murray, MD   BP 147/66 mmHg  Pulse 67  Temp(Src) 97.4 F (36.3 C) (Oral)  Resp 17  SpO2 100% Physical Exam  Constitutional: He is oriented to person, place, and time. He appears well-developed and well-nourished. No distress.  HENT:  Head: Normocephalic and atraumatic.  Mouth/Throat: Oropharynx is clear and moist.  Eyes: Conjunctivae are normal. Pupils are equal, round, and reactive to light.  Neck: Normal range of motion.   Cardiovascular: Normal rate, regular rhythm and normal heart sounds.   Pulmonary/Chest: Effort normal and breath sounds normal. No respiratory distress.  Abdominal: Soft. Bowel sounds are normal.  Abdomen is exquisitely tender with guarding of the Epigastric, RUQ, and LUQ regions.   Musculoskeletal: He exhibits no edema or tenderness.  Lymphadenopathy:    He has no cervical adenopathy.  Neurological: He is alert and oriented to person, place, and time.  Skin: Skin is warm. No rash noted. He is diaphoretic. There is pallor.  Nursing note and vitals reviewed.   ED Course  Procedures (including critical care time) Labs Review Labs Reviewed  COMPREHENSIVE METABOLIC PANEL - Abnormal; Notable for the following:    Potassium 3.3 (*)    Glucose, Bld 135 (*)    All other components within normal limits  CBC - Abnormal; Notable for the following:    WBC 1.7 (*)    RBC 2.92 (*)    Hemoglobin 8.4 (*)    HCT 25.0 (*)    Platelets 42 (*)    All other components within normal limits  LIPASE, BLOOD  URINALYSIS, ROUTINE W REFLEX MICROSCOPIC (NOT AT [facility])    Imaging Review No results found. I have personally reviewed and evaluated these images and lab results as part of my medical decision-making.   EKG Interpretation None      MDM   Final diagnoses:  Intractable vomiting with nausea, vomiting of unspecified type  Epigastric pain    JORDYNN PERRIER presents with N/V for the last 5 days.   Findings and plan of care discussed with Zenovia Jarred, MD.  Pt has previous workups documented and has had a complete GI workup multiple times to include an abdominal CT, endoscopy gastric, emptying study, and evaluation by GI specialists. Patient does have a current diagnosis of MDS and is followed by an oncologist for this problem. Patient does have a history of cholecystectomy last year with no complications. Chart review reveals this patient has recurrent abdominal pain, epigastric pain,  and multiple incidences of coming to the ED, his PCP, or his oncologist for continuous vomiting. Plan to treat patient symptomatically and refer back to his GI specialist as soon as possible. No current indications for narcotic pain management. No indications for imaging at this time. CMP does not give evidence of long-term intractable vomiting. GI consult ordered due to patient's long history of recurrent nausea and vomiting. It was explained to the patient and his wife by both myself and Dr. Thomasene Lot that we've reached the end of what we are capable of here in the ED, given his previous imaging and medical therapies. Pt is on home Zyprexa and Ativan for chronic N/V. Pt does not currently meet criteria for admission at this time due patient's lab values. Patient and patient's wife voiced their frustration about [place] not admitting this patient, but it was explained to them there is nothing the hospital can do during an admission that does not require consulting GI. Do not believe that abdominal imaging is appropriate at this time and this was explained to the patient and his wife. 4:24 PM End of shift patient care handoff report given to Courteney Julio Alm, MD.  Lorayne Bender, PA-C 11/20/14 [place], MD 11/20/14 2214

## 2014-11-20 NOTE — ED Provider Notes (Signed)
Patient is a 69 year old male presenting with intractable nausea vomiting. Patient has been to the ED multiple times with this in the last couple weeks. Since 1018 he's had intractable nausea vomiting. Patient is followed by GI and his oncologist. Patient is on home Zyprexa and home Ativan for his condition. Of note patient is requesting pain medications. I'm concerned that it is his pain medications that is helping the vomiting when he comes into the emergency department. It is possible that he is withdrawing from these pain medications which is what causing the vomiting outside hospital.  Thus I chose Ativan as a possible adjunct today. IV ativan abdomen is the thing that helps the patient the most. He feels much improved and is ready to go home. He is taking by mouth. We will send home with additional course of Ativan and have him follow-up this week with his physicians.  Lambros Cerro Julio Alm, MD 11/20/14 1845

## 2014-11-20 NOTE — ED Notes (Signed)
Pt reported acid reflux and emesis. Pt has been seen here Wednesday/Thursday for same problem but continues to have GI problems.

## 2014-11-21 ENCOUNTER — Ambulatory Visit: Payer: Medicare Other | Admitting: Family

## 2014-11-21 ENCOUNTER — Other Ambulatory Visit: Payer: Medicare Other

## 2014-11-21 ENCOUNTER — Telehealth: Payer: Self-pay

## 2014-11-21 ENCOUNTER — Ambulatory Visit: Payer: Medicare Other

## 2014-11-21 NOTE — Telephone Encounter (Signed)
Spoke with pt's wife re: recent ED visits and email messages. Dr Marin Olp has reviewed labs and ED summary. In agreement to attempt to hold pain medications to see if N/V resolve. Agree with pt taking Zyprexa and Ativan. [name] concerned about Ativan being habit forming, but encouraged that this is not usually the case. Questions if pt can take ginger supplements. OK to try per PRE. OK to cancel lab and flush appts today. Aware to contact our office should problems arise before next Monday appts. dph

## 2014-11-22 ENCOUNTER — Other Ambulatory Visit: Payer: Medicare Other

## 2014-11-22 ENCOUNTER — Ambulatory Visit: Payer: Medicare Other

## 2014-11-23 ENCOUNTER — Ambulatory Visit: Payer: Medicare Other

## 2014-11-24 ENCOUNTER — Ambulatory Visit: Payer: Medicare Other

## 2014-11-24 ENCOUNTER — Telehealth: Payer: Self-pay | Admitting: *Deleted

## 2014-11-24 ENCOUNTER — Encounter: Payer: Self-pay | Admitting: Hematology & Oncology

## 2014-11-24 NOTE — Telephone Encounter (Signed)
Called and spoke to patient's wife. Checking in on patient to see how he has been doing this week.  He continues to have nausea with poor po intake. She states he no longer c/o pain, but has constant nausea in his throat. His weight has continued to drop, now at 158 lbs. He continues to be depressed. His wife however says that today was a better day. He ate two small meals and got out of bed and went outside. She states this is a big improvement for him.   At this time she doesn't feel like he needs to come in sooner than the already scheduled Monday appointment. Instructed her to call the office if she has any needs.

## 2014-11-25 ENCOUNTER — Ambulatory Visit: Payer: Medicare Other

## 2014-11-28 ENCOUNTER — Telehealth: Payer: Self-pay | Admitting: *Deleted

## 2014-11-28 ENCOUNTER — Encounter: Payer: Self-pay | Admitting: Family

## 2014-11-28 ENCOUNTER — Ambulatory Visit: Payer: Medicare Other

## 2014-11-28 ENCOUNTER — Ambulatory Visit (HOSPITAL_BASED_OUTPATIENT_CLINIC_OR_DEPARTMENT_OTHER): Payer: Medicare Other | Admitting: Family

## 2014-11-28 ENCOUNTER — Other Ambulatory Visit (HOSPITAL_BASED_OUTPATIENT_CLINIC_OR_DEPARTMENT_OTHER): Payer: Medicare Other

## 2014-11-28 VITALS — BP 113/54 | HR 63 | Temp 97.9°F | Resp 16 | Wt 161.8 lb

## 2014-11-28 DIAGNOSIS — D4620 Refractory anemia with excess of blasts, unspecified: Secondary | ICD-10-CM

## 2014-11-28 DIAGNOSIS — B0223 Postherpetic polyneuropathy: Secondary | ICD-10-CM

## 2014-11-28 DIAGNOSIS — D46Z Other myelodysplastic syndromes: Secondary | ICD-10-CM

## 2014-11-28 LAB — CHCC SATELLITE - SMEAR

## 2014-11-28 LAB — CBC WITH DIFFERENTIAL (CANCER CENTER ONLY)
HCT: 21.4 % — ABNORMAL LOW (ref 38.7–49.9)
HGB: 6.9 g/dL — CL (ref 13.0–17.1)
MCH: 27.9 pg — AB (ref 28.0–33.4)
MCHC: 32.2 g/dL (ref 32.0–35.9)
MCV: 87 fL (ref 82–98)
PLATELETS: 74 10*3/uL — AB (ref 145–400)
RBC: 2.47 10*6/uL — AB (ref 4.20–5.70)
RDW: 14.4 % (ref 11.1–15.7)
WBC: 1.9 10*3/uL — ABNORMAL LOW (ref 4.0–10.0)

## 2014-11-28 LAB — MANUAL DIFFERENTIAL (CHCC SATELLITE)
ALC: 0.5 10*3/uL — ABNORMAL LOW (ref 0.9–3.3)
ANC (CHCC HP manual diff): 1.3 10*3/uL — ABNORMAL LOW (ref 1.5–6.5)
BAND NEUTROPHILS: 3 % (ref 0–10)
LYMPH: 27 % (ref 14–48)
MONO: 5 % (ref 0–13)
PLT EST ~~LOC~~: DECREASED
SEG: 65 % (ref 40–75)

## 2014-11-28 LAB — CMP (CANCER CENTER ONLY)
ALBUMIN: 3.6 g/dL (ref 3.3–5.5)
ALT: 17 U/L (ref 10–47)
AST: 14 U/L (ref 11–38)
Alkaline Phosphatase: 79 U/L (ref 26–84)
BUN: 14 mg/dL (ref 7–22)
CALCIUM: 8.7 mg/dL (ref 8.0–10.3)
CO2: 24 meq/L (ref 18–33)
Chloride: 104 mEq/L (ref 98–108)
Creat: 0.9 mg/dl (ref 0.6–1.2)
GLUCOSE: 108 mg/dL (ref 73–118)
Potassium: 2.7 mEq/L — CL (ref 3.3–4.7)
SODIUM: 140 meq/L (ref 128–145)
TOTAL PROTEIN: 7 g/dL (ref 6.4–8.1)
Total Bilirubin: 0.8 mg/dl (ref 0.20–1.60)

## 2014-11-28 LAB — LACTATE DEHYDROGENASE (CC13): LDH: 184 U/L (ref 125–245)

## 2014-11-28 LAB — HOLD TUBE, BLOOD BANK

## 2014-11-28 NOTE — Progress Notes (Signed)
Hematology and Oncology Follow Up Visit  JAHKAI CLARKIN SZ:[id number] 10/31/1945 69 y.o. 11/28/2014   Principle Diagnosis:  RAEB-2 (normal cytogenetics with mutated ASXL-1, TET2, U2AF1 genes) Herpes zoster of the right T9 dermatome Recurrent spontaneous left pneumothorax-status post VATS repair Hypotestosteronemia  Current Therapy:   Dose intense Vidaza 7 day cycles - had 1 cycle on 11/01/14 Aranesp 500 g subcutaneous every week needed for hemoglobin less than 10    Interim History: Mr. Alvardo is here today with his wife for a follow-up. He was in the ED again last week for intractable nausea and vomiting. He states that this was felt to be due to his body withdrawing from the Dilaudid he received around the clock while in the hospital in October. He is feeling better today but is still weak.  He has not had any more hallucinations. He still has some blurred vision which he attributes to the [facility] and has not been taking. He plans to make an appointment with his eye doctor to have this assessed.  He is eating lighter foods such as chicken soup and mashed potatoes. He has not vomited for 3 days. He has had some nausea in the evenings. He is drinking fluids. His weight is down 9 lbs since his last visit in October. With his appetite improving he is hoping to put this back on. He tried drinking an ensure but it did not taste good so he is holding off on that for now.  He has had no fever, chills, dizziness, SOB, chest pain, palpitations or changes in bowel or bladder habits.  He has had no episodes of bleeding, bruising or petechiae.  His shingles have crusted over and are drying up. He is not having pain from it at this time.  With the nausea he has not been taking his KDUR. His potassium today is 2.7. He does not wich to have potassium replacement IV and would prefer to take the KDUR pill at home. I did change his prescription to the fruit punch KDUR powder packets to see if this will be easier  on his stomach. This will be available at our pharmacy for his wife to pick up tomorrow.  He has no swelling, tenderness, numbness or tingling in his extremities at this time.   Medications:    Medication List       This list is accurate as of: 11/28/14  1:42 PM.  Always use your most recent med list.               acetaminophen 500 MG tablet  Commonly known as:  TYLENOL  Take 500 mg by mouth every 6 (six) hours as needed for fever.     diphenhydrAMINE 25 mg capsule  Commonly known as:  BENADRYL  Take 1 capsule (25 mg total) by mouth every 6 (six) hours as needed for itching. May take with Percocet PRN     lidocaine-prilocaine cream  Commonly known as:  EMLA  Apply 1 application topically as needed. Please dispense 2 tubes     metoprolol tartrate 25 MG tablet  Commonly known as:  LOPRESSOR  Take 0.5 tablets (12.5 mg total) by mouth 2 (two) times daily.     nitroGLYCERIN 0.4 MG SL tablet  Commonly known as:  NITROSTAT  Place 1 tablet (0.4 mg total) under the tongue every 5 (five) minutes as needed for chest pain.     pantoprazole 40 MG tablet  Commonly known as:  PROTONIX  Take 1 tablet (40  mg total) by mouth 2 (two) times daily.     potassium chloride SA 20 MEQ tablet  Commonly known as:  K-DUR,KLOR-CON  Take 2 tablets (40 mEq total) by mouth once.     simvastatin 20 MG tablet  Commonly known as:  ZOCOR  Take 1 tablet (20 mg total) by mouth at bedtime.     sucralfate 1 GM/10ML suspension  Commonly known as:  CARAFATE  Take 10 mLs (1 g total) by mouth 4 (four) times daily -  with meals and at bedtime.     temazepam 15 MG capsule  Commonly known as:  RESTORIL  Take 15 mg by mouth at bedtime as needed for sleep.        Allergies:  Allergies  Allergen Reactions  . Albuterol-Ipratropium-Soybean Lecithin [Ipratropium-Albuterol] Anxiety  . Cephalexin Other (See Comments)    Nausea and stomach cramps  . Compazine [Prochlorperazine] Anxiety  . Percocet  [Oxycodone-Acetaminophen] Itching    Can take with benadryl    . Phenergan [Promethazine Hcl] Other (See Comments)    "restless legs"  . Tramadol Other (See Comments)    "jerking limbs and talking in his sleep"  . Vicodin [Hydrocodone-Acetaminophen] Itching    Can take with benadryl   . Ativan [Lorazepam] Other (See Comments)    Shaking and hallucinations  . Marinol [Dronabinol] Other (See Comments)    "Affects his eyes"  . Reglan [Metoclopramide] Other (See Comments)    Patient has significant shakes that are troubling. Would not like to receive this if there are other options available.    Past Medical History, Surgical history, Social history, and Family History were reviewed and updated.  Review of Systems: All other 10 point review of systems is negative.   Physical Exam:  weight is 161 lb 12 oz (73.369 kg). His oral temperature is 97.9 F (36.6 C). His blood pressure is 113/54 and his pulse is 63. His respiration is 16.   Wt Readings from Last 3 Encounters:  11/28/14 161 lb 12 oz (73.369 kg)  11/06/14 170 lb 13.7 oz (77.5 kg)  10/24/14 172 lb (78.019 kg)    Ocular: Sclerae unicteric, pupils equal, round and reactive to light Ear-nose-throat: Oropharynx clear, dentition fair Lymphatic: No cervical or supraclavicular adenopathy Lungs no rales or rhonchi, good excursion bilaterally Heart regular rate and rhythm, no murmur appreciated Abd soft, nontender, positive bowel sounds MSK no focal spinal tenderness, no joint edema Neuro: non-focal, well-oriented, appropriate affect Breasts: Deferred  Lab Results  Component Value Date   WBC 1.9* 11/28/2014   HGB 6.9* 11/28/2014   HCT 21.4* 11/28/2014   MCV 87 11/28/2014   PLT 74* 11/28/2014   Lab Results  Component Value Date   FERRITIN 4,071* 10/24/2014   IRON 76 10/24/2014   TIBC 146* 10/24/2014   UIBC 70* 10/24/2014   IRONPCTSAT 52 10/24/2014   Lab Results  Component Value Date   RETICCTPCT 0.3* 10/24/2014    RBC 2.47* 11/28/2014   RETICCTABS 8.0* 10/24/2014   No results found for: KPAFRELGTCHN, LAMBDASER, KAPLAMBRATIO No results found for: Kandis Cocking, IGMSERUM Lab Results  Component Value Date   TOTALPROTELP 5.9* 06/12/2013   ALBUMINELP 51.7* 06/12/2013   A1GS 7.8* 06/12/2013   A2GS 13.3* 06/12/2013   BETS 6.8 06/12/2013   BETA2SER 4.7 06/12/2013   GAMS 15.7 06/12/2013   MSPIKE NOT DETECTED 06/12/2013   SPEI (NOTE) 06/12/2013     Chemistry      Component Value Date/Time   NA 140 11/28/2014 1044  NA 141 11/20/2014 1330   NA 136 09/12/2014 1325   K 2.7* 11/28/2014 1044   K 3.3* 11/20/2014 1330   K 3.7 09/12/2014 1325   CL 104 11/28/2014 1044   CL 108 11/20/2014 1330   CO2 24 11/28/2014 1044   CO2 23 11/20/2014 1330   CO2 21* 09/12/2014 1325   BUN 14 11/28/2014 1044   BUN 18 11/20/2014 1330   BUN 28.0* 09/12/2014 1325   CREATININE 0.9 11/28/2014 1044   CREATININE 1.19 11/20/2014 1330   CREATININE 1.4* 09/12/2014 1325      Component Value Date/Time   CALCIUM 8.7 11/28/2014 1044   CALCIUM 9.1 11/20/2014 1330   CALCIUM 8.9 09/12/2014 1325   ALKPHOS 79 11/28/2014 1044   ALKPHOS 100 11/20/2014 1330   ALKPHOS 79 09/12/2014 1325   AST 14 11/28/2014 1044   AST 17 11/20/2014 1330   AST 13 09/12/2014 1325   ALT 17 11/28/2014 1044   ALT 19 11/20/2014 1330   ALT 33 09/12/2014 1325   BILITOT 0.80 11/28/2014 1044   BILITOT 0.8 11/20/2014 1330   BILITOT 0.71 09/12/2014 1325     Impression and Plan: Mr. Timmermann is 69 year old gentleman with myelodysplasia. Cytogenetics in July were normal. NGS of his blood showed some abnormalities. He received 1 cycle of Vidaza in October but since then has had several physical set backs and has been unable to continue with treatment. He is recuperating from intractable n/v which was felt to be due to his withdrawing from the dilaudid he received around the clock during his hospitalization in October. He is starting to feel better and his  appetite is improving. He is drinking fluids. He has not vomited in 3 days.  He plans to slowly introduce Ensure back into his diet to help with weight gain.  He stopped taking the marinol because he states it was causing him to have double vision.  His shingles continue to fade. He denies having any pain from this and no new vesicles. We are still holding off on treating him for now.  His Hgb today is 6.9. After speaking with Dr. Marin Olp it was decided to just monitor this for now. His last transfusion was on November 1st.   We will follow up with him in one week and get repeat labs at that time.  He does not IV potassium replacement today and plans to take the [facility] pills he has at home. We did change his prescription to the fruit punch powder packets to see if these are easier for him to tolerate. They will be available for his wife to pick up tomorrow.  Both he and his wife will contact us with any questions or concerns. We can certainly see him sooner if need be.   Eliezer Bottom, NP 11/14/20161:42 PM

## 2014-11-28 NOTE — Patient Instructions (Signed)

## 2014-11-28 NOTE — Telephone Encounter (Signed)
Critical Value HGB 6.9  Potassium 2.7 Laverna Peace NP notified. She will place orders.

## 2014-11-29 ENCOUNTER — Other Ambulatory Visit: Payer: Medicare Other

## 2014-11-29 ENCOUNTER — Ambulatory Visit: Payer: Medicare Other

## 2014-11-29 LAB — IRON AND TIBC CHCC
%SAT: 40 % (ref 20–55)
Iron: 79 ug/dL (ref 42–163)
TIBC: 198 ug/dL — AB (ref 202–409)
UIBC: 118 ug/dL (ref 117–376)

## 2014-11-29 LAB — FERRITIN CHCC

## 2014-11-30 ENCOUNTER — Ambulatory Visit: Payer: Medicare Other

## 2014-12-01 ENCOUNTER — Other Ambulatory Visit: Payer: Medicare Other

## 2014-12-01 ENCOUNTER — Ambulatory Visit: Payer: Medicare Other

## 2014-12-02 ENCOUNTER — Ambulatory Visit: Payer: Medicare Other

## 2014-12-05 ENCOUNTER — Ambulatory Visit: Payer: Medicare Other

## 2014-12-05 ENCOUNTER — Ambulatory Visit (HOSPITAL_BASED_OUTPATIENT_CLINIC_OR_DEPARTMENT_OTHER): Payer: Medicare Other

## 2014-12-05 ENCOUNTER — Other Ambulatory Visit: Payer: Self-pay | Admitting: Family

## 2014-12-05 ENCOUNTER — Telehealth: Payer: Self-pay | Admitting: *Deleted

## 2014-12-05 ENCOUNTER — Other Ambulatory Visit (HOSPITAL_BASED_OUTPATIENT_CLINIC_OR_DEPARTMENT_OTHER): Payer: Medicare Other

## 2014-12-05 VITALS — BP 145/65 | HR 86 | Temp 98.0°F | Resp 20 | Wt 161.0 lb

## 2014-12-05 DIAGNOSIS — D696 Thrombocytopenia, unspecified: Secondary | ICD-10-CM

## 2014-12-05 DIAGNOSIS — D4620 Refractory anemia with excess of blasts, unspecified: Secondary | ICD-10-CM

## 2014-12-05 DIAGNOSIS — D46Z Other myelodysplastic syndromes: Secondary | ICD-10-CM

## 2014-12-05 LAB — HOLD TUBE, BLOOD BANK - CHCC SATELLITE

## 2014-12-05 LAB — CHCC SATELLITE - SMEAR

## 2014-12-05 LAB — CBC WITH DIFFERENTIAL (CANCER CENTER ONLY)
HCT: 19.2 % — ABNORMAL LOW (ref 38.7–49.9)
HGB: 6.2 g/dL — CL (ref 13.0–17.1)
MCH: 27.6 pg — ABNORMAL LOW (ref 28.0–33.4)
MCHC: 32.3 g/dL (ref 32.0–35.9)
MCV: 85 fL (ref 82–98)
PLATELETS: 65 10*3/uL — AB (ref 145–400)
RBC: 2.25 10*6/uL — AB (ref 4.20–5.70)
RDW: 14.7 % (ref 11.1–15.7)
WBC: 1.2 10*3/uL — AB (ref 4.0–10.0)

## 2014-12-05 LAB — MANUAL DIFFERENTIAL (CHCC SATELLITE)
ALC: 0.4 10*3/uL — AB (ref 0.9–3.3)
ANC (CHCC MAN DIFF): 0.6 10*3/uL — AB (ref 1.5–6.5)
LYMPH: 37 % (ref 14–48)
MONO: 11 % (ref 0–13)
NRBC: 3 % — AB (ref 0–0)
PLT EST ~~LOC~~: DECREASED
Platelet Morphology: NORMAL
SEG: 52 % (ref 40–75)

## 2014-12-05 MED ORDER — SODIUM CHLORIDE 0.9 % IJ SOLN
10.0000 mL | INTRAMUSCULAR | Status: DC | PRN
  Administered 2014-12-05: 10 mL via INTRAVENOUS
  Filled 2014-12-05: qty 10

## 2014-12-05 MED ORDER — HEPARIN SOD (PORK) LOCK FLUSH 100 UNIT/ML IV SOLN
500.0000 [IU] | Freq: Once | INTRAVENOUS | Status: AC
  Administered 2014-12-05: 500 [IU] via INTRAVENOUS
  Filled 2014-12-05: qty 5

## 2014-12-05 NOTE — Telephone Encounter (Signed)
Critical Value HGB 6.2 Dr Marin Olp notified. Orders received.  Patient notified and appointment made.

## 2014-12-05 NOTE — Progress Notes (Signed)
11:33 AM Pt requests phone call regarding labs and if he needs transfusion.

## 2014-12-05 NOTE — Patient Instructions (Signed)

## 2014-12-06 ENCOUNTER — Ambulatory Visit (HOSPITAL_BASED_OUTPATIENT_CLINIC_OR_DEPARTMENT_OTHER): Payer: Medicare Other

## 2014-12-06 ENCOUNTER — Ambulatory Visit: Payer: Medicare Other

## 2014-12-06 VITALS — BP 112/62 | HR 55 | Temp 98.1°F | Resp 18

## 2014-12-06 DIAGNOSIS — D649 Anemia, unspecified: Secondary | ICD-10-CM | POA: Diagnosis not present

## 2014-12-06 DIAGNOSIS — D4620 Refractory anemia with excess of blasts, unspecified: Secondary | ICD-10-CM

## 2014-12-06 DIAGNOSIS — D696 Thrombocytopenia, unspecified: Secondary | ICD-10-CM

## 2014-12-06 DIAGNOSIS — D46Z Other myelodysplastic syndromes: Secondary | ICD-10-CM

## 2014-12-06 LAB — PREALBUMIN: PREALBUMIN: 24 mg/dL (ref 21–43)

## 2014-12-06 LAB — RETICULOCYTES (CHCC)
ABS Retic: 18.6 10*3/uL — ABNORMAL LOW (ref 19.0–186.0)
RBC.: 2.32 MIL/uL — AB (ref 4.22–5.81)
Retic Ct Pct: 0.8 % (ref 0.4–2.3)

## 2014-12-06 LAB — PREPARE RBC (CROSSMATCH)

## 2014-12-06 MED ORDER — SODIUM CHLORIDE 0.9 % IV SOLN
INTRAVENOUS | Status: DC
  Administered 2014-12-06: 09:00:00 via INTRAVENOUS

## 2014-12-06 MED ORDER — DIPHENHYDRAMINE HCL 25 MG PO CAPS
25.0000 mg | ORAL_CAPSULE | Freq: Once | ORAL | Status: AC
  Administered 2014-12-06: 25 mg via ORAL

## 2014-12-06 MED ORDER — ACETAMINOPHEN 325 MG PO TABS
ORAL_TABLET | ORAL | Status: AC
  Filled 2014-12-06: qty 2

## 2014-12-06 MED ORDER — SODIUM CHLORIDE 0.9 % IJ SOLN
10.0000 mL | INTRAMUSCULAR | Status: AC | PRN
  Administered 2014-12-06: 10 mL
  Filled 2014-12-06: qty 10

## 2014-12-06 MED ORDER — FUROSEMIDE 10 MG/ML IJ SOLN: 20.0000 mg | Freq: Once | INTRAMUSCULAR | Status: DC

## 2014-12-06 MED ORDER — HEPARIN SOD (PORK) LOCK FLUSH 100 UNIT/ML IV SOLN
500.0000 [IU] | Freq: Every day | INTRAVENOUS | Status: AC | PRN
  Administered 2014-12-06: 500 [IU]
  Filled 2014-12-06: qty 5

## 2014-12-06 MED ORDER — ACETAMINOPHEN 325 MG PO TABS
650.0000 mg | ORAL_TABLET | Freq: Once | ORAL | Status: AC
  Administered 2014-12-06: 650 mg via ORAL

## 2014-12-06 MED ORDER — DIPHENHYDRAMINE HCL 25 MG PO CAPS
ORAL_CAPSULE | ORAL | Status: AC
  Filled 2014-12-06: qty 1

## 2014-12-06 NOTE — Patient Instructions (Signed)

## 2014-12-07 LAB — TYPE AND SCREEN
ABO/RH(D): A POS
Antibody Screen: NEGATIVE
UNIT DIVISION: 0
Unit division: 0

## 2014-12-12 ENCOUNTER — Ambulatory Visit (HOSPITAL_BASED_OUTPATIENT_CLINIC_OR_DEPARTMENT_OTHER): Payer: Medicare Other

## 2014-12-12 ENCOUNTER — Other Ambulatory Visit (HOSPITAL_BASED_OUTPATIENT_CLINIC_OR_DEPARTMENT_OTHER): Payer: Medicare Other

## 2014-12-12 VITALS — BP 118/63 | HR 65 | Temp 98.1°F | Resp 18

## 2014-12-12 DIAGNOSIS — D4620 Refractory anemia with excess of blasts, unspecified: Secondary | ICD-10-CM | POA: Diagnosis present

## 2014-12-12 DIAGNOSIS — D469 Myelodysplastic syndrome, unspecified: Secondary | ICD-10-CM

## 2014-12-12 DIAGNOSIS — D46Z Other myelodysplastic syndromes: Secondary | ICD-10-CM

## 2014-12-12 LAB — CBC WITH DIFFERENTIAL (CANCER CENTER ONLY)
HCT: 25.5 % — ABNORMAL LOW (ref 38.7–49.9)
HEMOGLOBIN: 8.2 g/dL — AB (ref 13.0–17.1)
MCH: 27.4 pg — AB (ref 28.0–33.4)
MCHC: 32.2 g/dL (ref 32.0–35.9)
MCV: 85 fL (ref 82–98)
Platelets: 20 10*3/uL — ABNORMAL LOW (ref 145–400)
RBC: 2.99 10*6/uL — AB (ref 4.20–5.70)
RDW: 14.4 % (ref 11.1–15.7)
WBC: 1.3 10*3/uL — AB (ref 4.0–10.0)

## 2014-12-12 LAB — MANUAL DIFFERENTIAL (CHCC SATELLITE)
ALC: 0.5 10*3/uL — ABNORMAL LOW (ref 0.9–3.3)
ANC (CHCC MAN DIFF): 0.7 10*3/uL — AB (ref 1.5–6.5)
BAND NEUTROPHILS: 3 % (ref 0–10)
LYMPH: 35 % (ref 14–48)
MONO: 9 % (ref 0–13)
NRBC: 1 % — AB (ref 0–0)
PLT EST ~~LOC~~: DECREASED
SEG: 53 % (ref 40–75)

## 2014-12-12 LAB — HOLD TUBE, BLOOD BANK - CHCC SATELLITE

## 2014-12-12 MED ORDER — HEPARIN SOD (PORK) LOCK FLUSH 100 UNIT/ML IV SOLN
500.0000 [IU] | Freq: Once | INTRAVENOUS | Status: AC
  Administered 2014-12-12: 500 [IU] via INTRAVENOUS
  Filled 2014-12-12: qty 5

## 2014-12-12 MED ORDER — SODIUM CHLORIDE 0.9 % IJ SOLN
10.0000 mL | INTRAMUSCULAR | Status: DC | PRN
  Administered 2014-12-12: 10 mL via INTRAVENOUS
  Filled 2014-12-12: qty 10

## 2014-12-12 NOTE — Patient Instructions (Signed)

## 2014-12-16 ENCOUNTER — Other Ambulatory Visit: Payer: Self-pay | Admitting: *Deleted

## 2014-12-16 DIAGNOSIS — D46Z Other myelodysplastic syndromes: Secondary | ICD-10-CM

## 2014-12-19 ENCOUNTER — Ambulatory Visit: Payer: Medicare Other

## 2014-12-19 ENCOUNTER — Other Ambulatory Visit: Payer: Self-pay | Admitting: Hematology & Oncology

## 2014-12-19 ENCOUNTER — Ambulatory Visit: Payer: Medicare Other | Admitting: Hematology & Oncology

## 2014-12-19 ENCOUNTER — Other Ambulatory Visit (HOSPITAL_BASED_OUTPATIENT_CLINIC_OR_DEPARTMENT_OTHER): Payer: Medicare Other

## 2014-12-19 DIAGNOSIS — D4620 Refractory anemia with excess of blasts, unspecified: Secondary | ICD-10-CM | POA: Diagnosis not present

## 2014-12-19 DIAGNOSIS — D46Z Other myelodysplastic syndromes: Secondary | ICD-10-CM

## 2014-12-19 LAB — MANUAL DIFFERENTIAL (CHCC SATELLITE)
ALC: 0.5 10*3/uL — ABNORMAL LOW (ref 0.9–3.3)
ANC (CHCC HP manual diff): 0.7 10*3/uL — ABNORMAL LOW (ref 1.5–6.5)
LYMPH: 41 % (ref 14–48)
MONO: 7 % (ref 0–13)
PLT EST ~~LOC~~: DECREASED
Platelet Morphology: NORMAL
SEG: 52 % (ref 40–75)

## 2014-12-19 LAB — CBC WITH DIFFERENTIAL (CANCER CENTER ONLY)
HEMATOCRIT: 22.7 % — AB (ref 38.7–49.9)
HEMOGLOBIN: 7.2 g/dL — AB (ref 13.0–17.1)
MCH: 26.7 pg — ABNORMAL LOW (ref 28.0–33.4)
MCHC: 31.7 g/dL — AB (ref 32.0–35.9)
MCV: 84 fL (ref 82–98)
Platelets: 45 10*3/uL — ABNORMAL LOW (ref 145–400)
RBC: 2.7 10*6/uL — ABNORMAL LOW (ref 4.20–5.70)
RDW: 14.7 % (ref 11.1–15.7)
WBC: 1.3 10*3/uL — AB (ref 4.0–10.0)

## 2014-12-19 LAB — COMPREHENSIVE METABOLIC PANEL
ALBUMIN: 3.8 g/dL (ref 3.5–5.0)
ALK PHOS: 87 U/L (ref 40–150)
AST: 9 U/L (ref 5–34)
Anion Gap: 9 mEq/L (ref 3–11)
BILIRUBIN TOTAL: 0.6 mg/dL (ref 0.20–1.20)
BUN: 19 mg/dL (ref 7.0–26.0)
CALCIUM: 9.2 mg/dL (ref 8.4–10.4)
CO2: 22 mEq/L (ref 22–29)
CREATININE: 1.1 mg/dL (ref 0.7–1.3)
Chloride: 110 mEq/L — ABNORMAL HIGH (ref 98–109)
EGFR: 68 mL/min/{1.73_m2} — ABNORMAL LOW (ref >90)
Glucose: 99 mg/dl (ref 70–140)
Potassium: 3.8 mEq/L (ref 3.5–5.1)
Sodium: 140 mEq/L (ref 136–145)
TOTAL PROTEIN: 7.4 g/dL (ref 6.4–8.3)

## 2014-12-19 LAB — HOLD TUBE, BLOOD BANK - CHCC SATELLITE

## 2014-12-19 NOTE — Progress Notes (Signed)
After reviewing pt labs, Dr Marin Olp recommends pt to receive testosterone injection and/or Aranesp. Pt has refused these in the past. MD does not wish to transfuse patient at this time.   Pt wishes to receive no treatment at this time. DR Marin Olp aware. dph

## 2014-12-19 NOTE — Patient Instructions (Signed)
Anemia, Nonspecific Anemia is a condition in which the concentration of red blood cells or hemoglobin in the blood is below normal. Hemoglobin is a substance in red blood cells that carries oxygen to the tissues of the body. Anemia results in not enough oxygen reaching these tissues.  CAUSES  Common causes of anemia include:   Excessive bleeding. Bleeding may be internal or external. This includes excessive bleeding from periods (in women) or from the intestine.   Poor nutrition.   Chronic kidney, thyroid, and liver disease.  Bone marrow disorders that decrease red blood cell production.  Cancer and treatments for cancer.  HIV, AIDS, and their treatments.  Spleen problems that increase red blood cell destruction.  Blood disorders.  Excess destruction of red blood cells due to infection, medicines, and autoimmune disorders. SIGNS AND SYMPTOMS   Minor weakness.   Dizziness.   Headache.  Palpitations.   Shortness of breath, especially with exercise.   Paleness.  Cold sensitivity.  Indigestion.  Nausea.  Difficulty sleeping.  Difficulty concentrating. Symptoms may occur suddenly or they may develop slowly.  DIAGNOSIS  Additional blood tests are often needed. These help your health care provider determine the best treatment. Your health care provider will check your stool for blood and look for other causes of blood loss.  TREATMENT  Treatment varies depending on the cause of the anemia. Treatment can include:   Supplements of iron, vitamin B12, or folic acid.   Hormone medicines.   A blood transfusion. This may be needed if blood loss is severe.   Hospitalization. This may be needed if there is significant continual blood loss.   Dietary changes.  Spleen removal. HOME CARE INSTRUCTIONS Keep all follow-up appointments. It often takes many weeks to correct anemia, and having your health care provider check on your condition and your response to  treatment is very important. SEEK IMMEDIATE MEDICAL CARE IF:   You develop extreme weakness, shortness of breath, or chest pain.   You become dizzy or have trouble concentrating.  You develop heavy vaginal bleeding.   You develop a rash.   You have bloody or black, tarry stools.   You faint.   You vomit up blood.   You vomit repeatedly.   You have abdominal pain.  You have a fever or persistent symptoms for more than 2-3 days.   You have a fever and your symptoms suddenly get worse.   You are dehydrated.  MAKE SURE YOU:  Understand these instructions.  Will watch your condition.  Will get help right away if you are not doing well or get worse.   This information is not intended to replace advice given to you by your health care provider. Make sure you discuss any questions you have with your health care provider.   Document Released: 02/08/2004 Document Revised: 09/02/2012 Document Reviewed: 06/26/2012 Elsevier Interactive Patient Education 2016 [employer]  

## 2014-12-20 ENCOUNTER — Ambulatory Visit: Payer: Medicare Other

## 2014-12-21 ENCOUNTER — Ambulatory Visit: Payer: Medicare Other

## 2014-12-22 ENCOUNTER — Ambulatory Visit: Payer: Medicare Other

## 2014-12-23 ENCOUNTER — Other Ambulatory Visit: Payer: Self-pay | Admitting: *Deleted

## 2014-12-23 ENCOUNTER — Encounter: Payer: Self-pay | Admitting: Hematology & Oncology

## 2014-12-23 ENCOUNTER — Ambulatory Visit: Payer: Medicare Other

## 2014-12-23 ENCOUNTER — Other Ambulatory Visit: Payer: Self-pay | Admitting: Hematology & Oncology

## 2014-12-23 DIAGNOSIS — D46Z Other myelodysplastic syndromes: Secondary | ICD-10-CM

## 2014-12-23 DIAGNOSIS — K589 Irritable bowel syndrome without diarrhea: Secondary | ICD-10-CM

## 2014-12-23 MED ORDER — PB-HYOSCY-ATROPINE-SCOPOLAMINE 16.2 MG PO TABS: 1.0000 | ORAL_TABLET | Freq: Three times a day (TID) | ORAL | Status: AC | PRN

## 2014-12-26 ENCOUNTER — Encounter: Payer: Self-pay | Admitting: Hematology & Oncology

## 2014-12-26 ENCOUNTER — Ambulatory Visit: Payer: Medicare Other

## 2014-12-26 ENCOUNTER — Other Ambulatory Visit: Payer: Self-pay | Admitting: Family

## 2014-12-26 ENCOUNTER — Ambulatory Visit (HOSPITAL_COMMUNITY)
Admission: RE | Admit: 2014-12-26 | Discharge: 2014-12-26 | Disposition: A | Discharge status: Home / Self Care | Payer: Medicare Other | Source: Ambulatory Visit | Attending: Hematology & Oncology | Admitting: Hematology & Oncology

## 2014-12-26 ENCOUNTER — Telehealth: Payer: Self-pay | Admitting: *Deleted

## 2014-12-26 ENCOUNTER — Ambulatory Visit (HOSPITAL_BASED_OUTPATIENT_CLINIC_OR_DEPARTMENT_OTHER): Payer: Medicare Other | Admitting: Hematology & Oncology

## 2014-12-26 ENCOUNTER — Ambulatory Visit (HOSPITAL_BASED_OUTPATIENT_CLINIC_OR_DEPARTMENT_OTHER): Payer: Medicare Other

## 2014-12-26 ENCOUNTER — Other Ambulatory Visit (HOSPITAL_BASED_OUTPATIENT_CLINIC_OR_DEPARTMENT_OTHER): Payer: Medicare Other

## 2014-12-26 ENCOUNTER — Other Ambulatory Visit: Payer: Medicare Other

## 2014-12-26 VITALS — BP 115/60 | HR 75 | Temp 98.4°F | Resp 16 | Ht 70.0 in | Wt 163.0 lb

## 2014-12-26 DIAGNOSIS — D4622 Refractory anemia with excess of blasts 2: Secondary | ICD-10-CM | POA: Insufficient documentation

## 2014-12-26 DIAGNOSIS — D46Z Other myelodysplastic syndromes: Secondary | ICD-10-CM

## 2014-12-26 DIAGNOSIS — D4620 Refractory anemia with excess of blasts, unspecified: Secondary | ICD-10-CM | POA: Diagnosis present

## 2014-12-26 LAB — CBC WITH DIFFERENTIAL (CANCER CENTER ONLY)
HEMATOCRIT: 19.2 % — AB (ref 38.7–49.9)
HEMOGLOBIN: 6.1 g/dL — AB (ref 13.0–17.1)
MCH: 26.4 pg — ABNORMAL LOW (ref 28.0–33.4)
MCHC: 31.8 g/dL — AB (ref 32.0–35.9)
MCV: 83 fL (ref 82–98)
Platelets: 38 10*3/uL — ABNORMAL LOW (ref 145–400)
RBC: 2.31 10*6/uL — ABNORMAL LOW (ref 4.20–5.70)
RDW: 14.9 % (ref 11.1–15.7)
WBC: 1.2 10*3/uL — AB (ref 4.0–10.0)

## 2014-12-26 LAB — MANUAL DIFFERENTIAL (CHCC SATELLITE)
ALC: 0.4 10*3/uL — AB (ref 0.9–3.3)
ANC (CHCC HP manual diff): 0.5 10*3/uL — ABNORMAL LOW (ref 1.5–6.5)
BAND NEUTROPHILS: 1 % (ref 0–10)
LYMPH: 36 % (ref 14–48)
MONO: 21 % — ABNORMAL HIGH (ref 0–13)
PLT EST ~~LOC~~: DECREASED
SEG: 42 % (ref 40–75)

## 2014-12-26 LAB — COMPREHENSIVE METABOLIC PANEL
ALT: <9 U/L (ref 0–55)
AST: 8 U/L (ref 5–34)
Albumin: 3.8 g/dL (ref 3.5–5.0)
Alkaline Phosphatase: 78 U/L (ref 40–150)
Anion Gap: 8 mEq/L (ref 3–11)
BUN: 17.8 mg/dL (ref 7.0–26.0)
CO2: 21 meq/L — AB (ref 22–29)
Calcium: 8.8 mg/dL (ref 8.4–10.4)
Chloride: 109 mEq/L (ref 98–109)
Creatinine: 1.2 mg/dL (ref 0.7–1.3)
EGFR: 63 mL/min/{1.73_m2} — AB (ref >90)
GLUCOSE: 109 mg/dL (ref 70–140)
POTASSIUM: 3.5 meq/L (ref 3.5–5.1)
SODIUM: 139 meq/L (ref 136–145)
Total Bilirubin: 0.57 mg/dL (ref 0.20–1.20)
Total Protein: 7.2 g/dL (ref 6.4–8.3)

## 2014-12-26 LAB — HOLD TUBE, BLOOD BANK - CHCC SATELLITE

## 2014-12-26 MED ORDER — SODIUM CHLORIDE 0.9 % IJ SOLN
10.0000 mL | INTRAMUSCULAR | Status: DC | PRN
  Administered 2014-12-26: 10 mL via INTRAVENOUS
  Filled 2014-12-26: qty 10

## 2014-12-26 MED ORDER — HEPARIN SOD (PORK) LOCK FLUSH 100 UNIT/ML IV SOLN
500.0000 [IU] | Freq: Once | INTRAVENOUS | Status: AC
  Administered 2014-12-26: 500 [IU] via INTRAVENOUS
  Filled 2014-12-26: qty 5

## 2014-12-26 NOTE — Patient Instructions (Signed)

## 2014-12-26 NOTE — Telephone Encounter (Signed)
Critical Value HGB 6.1 Dr Marin Olp notified. Orders will be placed

## 2014-12-26 NOTE — Progress Notes (Signed)
Hematology and Oncology Follow Up Visit  [name] [id number] 08-Apr-1945 69 y.o. 12/26/2014   Principle Diagnosis:   RAEB-2 (normal cytogenetics with mutated ASXL-1, TET2, U2AF1 genes)  Herpes zoster of the right T9 dermatome  Recurrent spontaneous left pneumothorax-status post VATS repair  Hypotestosteronemia  Current Therapy:    Dose intense Vidaza-7 day cycles. To start today  Aranesp 500 g subcutaneous every week needed for hemoglobin less than 10     Interim History:  [name] is back for follow-up. He says he feels okay. He comes by himself today. His wife isn't at home.  He was last transfused probably about 3 weeks ago.  He really has not responded to Aranesp. We have tried Aranesp. I'm surprised he has not responded because his erythropoietin level is not that high.  His last iron studies done back in November showed a ferritin of 2000. His iron saturation was only 40%. His total iron was only 79. It is apparent that the irritation is mostly an acute phase reactant.  He says appetite is doing better. He does not have as much abdominal pain. For some reason, Donnatal seems to help him. I gave him a prescription for this.  He's had no problems of cough or shortness of breath. He had a spontaneous left pneumothorax back in the summer that finally required surgery.   He's had no obvious bleeding.   He still has some burning where he had the herpetic shingles rash in the right T9 dermatome. He has some lidocaine patches at home that told him that he can try to help with some of the burning.  Overall, his performance status is ECOG 2.  Medications:  Current outpatient prescriptions:  .  acetaminophen (TYLENOL) 500 MG tablet, Take 500 mg by mouth every 6 (six) hours as needed for fever., Disp: , Rfl:  .  belladona alk-PHENObarbital (DONNATAL) 16.2 MG tablet, Take 1 tablet by mouth every 8 (eight) hours as needed., Disp: 120 tablet, Rfl: 2 .  diphenhydrAMINE  (BENADRYL) 25 mg capsule, Take 1 capsule (25 mg total) by mouth every 6 (six) hours as needed for itching. May take with Percocet PRN, Disp: 30 capsule, Rfl: 0 .  lidocaine-prilocaine (EMLA) cream, Apply 1 application topically as needed. Please dispense 2 tubes, Disp: 60 g, Rfl: 3 .  metoprolol tartrate (LOPRESSOR) 25 MG tablet, Take 0.5 tablets (12.5 mg total) by mouth 2 (two) times daily., Disp: 90 tablet, Rfl: 3 .  nitroGLYCERIN (NITROSTAT) 0.4 MG SL tablet, Place 1 tablet (0.4 mg total) under the tongue every 5 (five) minutes as needed for chest pain., Disp: 25 tablet, Rfl: 6 .  pantoprazole (PROTONIX) 40 MG tablet, Take 1 tablet (40 mg total) by mouth 2 (two) times daily., Disp: 60 tablet, Rfl: 0 .  potassium chloride SA (K-DUR,KLOR-CON) 20 MEQ tablet, Take 2 tablets (40 mEq total) by mouth once., Disp: 14 tablet, Rfl: 0 .  simvastatin (ZOCOR) 20 MG tablet, Take 1 tablet (20 mg total) by mouth at bedtime., Disp: 30 tablet, Rfl: 5 .  sucralfate (CARAFATE) 1 GM/10ML suspension, Take 10 mLs (1 g total) by mouth 4 (four) times daily -  with meals and at bedtime., Disp: 420 mL, Rfl: 0 .  temazepam (RESTORIL) 15 MG capsule, Take 15 mg by mouth at bedtime as needed for sleep., Disp: , Rfl:   Allergies:  Allergies  Allergen Reactions  . Albuterol-Ipratropium-Soybean Lecithin [Ipratropium-Albuterol] Anxiety  . Cephalexin Other (See Comments)    Nausea and stomach cramps  .  Compazine [Prochlorperazine] Anxiety  . Percocet [Oxycodone-Acetaminophen] Itching    Can take with benadryl    . Phenergan [Promethazine Hcl] Other (See Comments)    "restless legs"  . Tramadol Other (See Comments)    "jerking limbs and talking in his sleep"  . Vicodin [Hydrocodone-Acetaminophen] Itching    Can take with benadryl   . Ativan [Lorazepam] Other (See Comments)    Shaking and hallucinations  . Marinol [Dronabinol] Other (See Comments)    "Affects his eyes"  . Reglan [Metoclopramide] Other (See Comments)     Patient has significant shakes that are troubling. Would not like to receive this if there are other options available.    Past Medical History, Surgical history, Social history, and Family History were reviewed and updated.  Review of Systems: As above  Physical Exam:  height is $RemoveB'5\' 10"'ChvFYWwU$  (1.778 m) and weight is 163 lb (73.936 kg). His oral temperature is 98.4 F (36.9 C). His blood pressure is 115/60 and his pulse is 75. His respiration is 16 and oxygen saturation is 99%.   Wt Readings from Last 3 Encounters:  12/26/14 163 lb (73.936 kg)  12/05/14 161 lb (73.029 kg)  11/28/14 161 lb 12 oz (73.369 kg)     Well-developed and well-nourished white gentleman in no obvious distress. Head and neck exam shows no ocular or oral lesions. There are no palpable cervical or supraclavicular lymph does. Lungs are clear. Cardiac exam regular rate and rhythm with no murmurs, rubs or bruits. Abdomen is soft. Has good bowel sounds. There is no fluid wave. There is no palpable liver or spleen tip. Back exam shows no tenderness over the spine, ribs or hips. Extremities shows no clubbing, cyanosis or edema. Skin exam shows the healed  herpetic rash in the right T9 dermatome. He has drying of the vesicles. He has some bleeding into the vesicles. Neurological exam is nonfocal.  Lab Results  Component Value Date   WBC 1.2* 12/26/2014   HGB 6.1* 12/26/2014   HCT 19.2* 12/26/2014   MCV 83 12/26/2014   PLT 38* 12/26/2014     Chemistry      Component Value Date/Time   NA 140 12/19/2014 1047   NA 140 11/28/2014 1044   NA 141 11/20/2014 1330   K 3.8 12/19/2014 1047   K 2.7* 11/28/2014 1044   K 3.3* 11/20/2014 1330   CL 104 11/28/2014 1044   CL 108 11/20/2014 1330   CO2 22 12/19/2014 1047   CO2 24 11/28/2014 1044   CO2 23 11/20/2014 1330   BUN 19.0 12/19/2014 1047   BUN 14 11/28/2014 1044   BUN 18 11/20/2014 1330   CREATININE 1.1 12/19/2014 1047   CREATININE 0.9 11/28/2014 1044   CREATININE 1.19  11/20/2014 1330      Component Value Date/Time   CALCIUM 9.2 12/19/2014 1047   CALCIUM 8.7 11/28/2014 1044   CALCIUM 9.1 11/20/2014 1330   ALKPHOS 87 12/19/2014 1047   ALKPHOS 79 11/28/2014 1044   ALKPHOS 100 11/20/2014 1330   AST 9 12/19/2014 1047   AST 14 11/28/2014 1044   AST 17 11/20/2014 1330   ALT <9 12/19/2014 1047   ALT 17 11/28/2014 1044   ALT 19 11/20/2014 1330   BILITOT 0.60 12/19/2014 1047   BILITOT 0.80 11/28/2014 1044   BILITOT 0.8 11/20/2014 1330         Impression and Plan: Mr. Sarr is 69 year old gentleman with myelodysplasia. He has normal cytogenetics. We did do the NGS of his  blood which showed some abnormalities. Again, these might indicate an increased risk of transformation but it is not definitive.  He really does not want any more therapy for the myelodysplasia. We are just support him with transfusions.  We are watching his iron studies.  It is apparent that the Aranesp this is not working. I don't see any reason that we have to give him this.  We will transfuse him tomorrow.  He gets blood work weekly.  I'll plan to see him back in about 3-4 weeks.   I spent about 25 minutes with him today. Volanda Napoleon, MD 12/12/201610:12 AM

## 2014-12-27 ENCOUNTER — Other Ambulatory Visit: Payer: Medicare Other

## 2014-12-27 ENCOUNTER — Encounter: Payer: Self-pay | Admitting: Hematology & Oncology

## 2014-12-27 ENCOUNTER — Ambulatory Visit: Payer: Medicare Other

## 2014-12-27 LAB — PREPARE RBC (CROSSMATCH)

## 2014-12-28 ENCOUNTER — Ambulatory Visit: Payer: Medicare Other

## 2014-12-28 ENCOUNTER — Ambulatory Visit (HOSPITAL_BASED_OUTPATIENT_CLINIC_OR_DEPARTMENT_OTHER): Payer: Medicare Other

## 2014-12-28 VITALS — BP 116/63 | HR 63 | Temp 98.2°F | Resp 20

## 2014-12-28 DIAGNOSIS — D4620 Refractory anemia with excess of blasts, unspecified: Secondary | ICD-10-CM | POA: Diagnosis present

## 2014-12-28 DIAGNOSIS — D4622 Refractory anemia with excess of blasts 2: Secondary | ICD-10-CM | POA: Diagnosis not present

## 2014-12-28 DIAGNOSIS — D46Z Other myelodysplastic syndromes: Secondary | ICD-10-CM

## 2014-12-28 DIAGNOSIS — D696 Thrombocytopenia, unspecified: Secondary | ICD-10-CM

## 2014-12-28 MED ORDER — SODIUM CHLORIDE 0.9 % IJ SOLN
10.0000 mL | INTRAMUSCULAR | Status: DC | PRN
  Administered 2014-12-28: 10 mL via INTRAVENOUS
  Filled 2014-12-28: qty 10

## 2014-12-28 MED ORDER — SODIUM CHLORIDE 0.9 % IV SOLN
250.0000 mL | Freq: Once | INTRAVENOUS | Status: AC
  Administered 2014-12-28: 250 mL via INTRAVENOUS

## 2014-12-28 MED ORDER — HEPARIN SOD (PORK) LOCK FLUSH 100 UNIT/ML IV SOLN
250.0000 [IU] | INTRAVENOUS | Status: DC | PRN
  Filled 2014-12-28: qty 5

## 2014-12-28 MED ORDER — FUROSEMIDE 10 MG/ML IJ SOLN: 20.0000 mg | Freq: Once | INTRAMUSCULAR | Status: DC

## 2014-12-28 MED ORDER — ACETAMINOPHEN 325 MG PO TABS
ORAL_TABLET | ORAL | Status: AC
  Filled 2014-12-28: qty 2

## 2014-12-28 MED ORDER — FUROSEMIDE 10 MG/ML IJ SOLN
INTRAMUSCULAR | Status: AC
  Filled 2014-12-28: qty 4

## 2014-12-28 MED ORDER — DIPHENHYDRAMINE HCL 25 MG PO CAPS
ORAL_CAPSULE | ORAL | Status: AC
  Filled 2014-12-28: qty 1

## 2014-12-28 MED ORDER — ACETAMINOPHEN 325 MG PO TABS
650.0000 mg | ORAL_TABLET | Freq: Once | ORAL | Status: AC
  Administered 2014-12-28: 650 mg via ORAL

## 2014-12-28 MED ORDER — DIPHENHYDRAMINE HCL 25 MG PO CAPS
25.0000 mg | ORAL_CAPSULE | Freq: Once | ORAL | Status: AC
  Administered 2014-12-28: 25 mg via ORAL

## 2014-12-28 MED ORDER — SODIUM CHLORIDE 0.9 % IJ SOLN
3.0000 mL | INTRAMUSCULAR | Status: DC | PRN
  Filled 2014-12-28: qty 10

## 2014-12-28 MED ORDER — HEPARIN SOD (PORK) LOCK FLUSH 100 UNIT/ML IV SOLN
500.0000 [IU] | Freq: Once | INTRAVENOUS | Status: AC
  Administered 2014-12-28: 500 [IU] via INTRAVENOUS
  Filled 2014-12-28: qty 5

## 2014-12-28 NOTE — Patient Instructions (Signed)

## 2014-12-29 ENCOUNTER — Ambulatory Visit: Payer: Medicare Other

## 2014-12-29 LAB — TYPE AND SCREEN
ABO/RH(D): A POS
ANTIBODY SCREEN: NEGATIVE
Unit division: 0
Unit division: 0

## 2014-12-30 ENCOUNTER — Ambulatory Visit: Payer: Medicare Other

## 2014-12-30 ENCOUNTER — Other Ambulatory Visit: Payer: Self-pay | Admitting: *Deleted

## 2014-12-30 DIAGNOSIS — D46Z Other myelodysplastic syndromes: Secondary | ICD-10-CM

## 2015-01-02 ENCOUNTER — Other Ambulatory Visit (HOSPITAL_BASED_OUTPATIENT_CLINIC_OR_DEPARTMENT_OTHER): Payer: Medicare Other

## 2015-01-02 ENCOUNTER — Ambulatory Visit (HOSPITAL_BASED_OUTPATIENT_CLINIC_OR_DEPARTMENT_OTHER): Payer: Medicare Other

## 2015-01-02 ENCOUNTER — Ambulatory Visit: Payer: Medicare Other

## 2015-01-02 VITALS — BP 114/60 | HR 69 | Temp 98.4°F | Resp 18

## 2015-01-02 DIAGNOSIS — D46Z Other myelodysplastic syndromes: Secondary | ICD-10-CM

## 2015-01-02 DIAGNOSIS — D4620 Refractory anemia with excess of blasts, unspecified: Secondary | ICD-10-CM

## 2015-01-02 LAB — MANUAL DIFFERENTIAL (CHCC SATELLITE)
ALC: 0.4 10*3/uL — ABNORMAL LOW (ref 0.9–3.3)
ANC (CHCC MAN DIFF): 0.6 10*3/uL — AB (ref 1.5–6.5)
Band Neutrophils: 3 % (ref 0–10)
LYMPH: 34 % (ref 14–48)
MONO: 14 % — ABNORMAL HIGH (ref 0–13)
PLT EST ~~LOC~~: DECREASED
SEG: 49 % (ref 40–75)

## 2015-01-02 LAB — COMPREHENSIVE METABOLIC PANEL
ALBUMIN: 3.7 g/dL (ref 3.5–5.0)
ALK PHOS: 80 U/L (ref 40–150)
AST: 9 U/L (ref 5–34)
Anion Gap: 9 mEq/L (ref 3–11)
BUN: 14 mg/dL (ref 7.0–26.0)
CALCIUM: 8.6 mg/dL (ref 8.4–10.4)
CO2: 24 mEq/L (ref 22–29)
CREATININE: 1 mg/dL (ref 0.7–1.3)
Chloride: 107 mEq/L (ref 98–109)
EGFR: 77 mL/min/{1.73_m2} — ABNORMAL LOW (ref >90)
GLUCOSE: 99 mg/dL (ref 70–140)
Potassium: 3.6 mEq/L (ref 3.5–5.1)
SODIUM: 140 meq/L (ref 136–145)
Total Bilirubin: 0.53 mg/dL (ref 0.20–1.20)
Total Protein: 7 g/dL (ref 6.4–8.3)

## 2015-01-02 LAB — CBC WITH DIFFERENTIAL (CANCER CENTER ONLY)
HEMATOCRIT: 22 % — AB (ref 38.7–49.9)
HEMOGLOBIN: 7.1 g/dL — AB (ref 13.0–17.1)
MCH: 26.9 pg — ABNORMAL LOW (ref 28.0–33.4)
MCHC: 32.3 g/dL (ref 32.0–35.9)
MCV: 83 fL (ref 82–98)
Platelets: 27 10*3/uL — ABNORMAL LOW (ref 145–400)
RBC: 2.64 10*6/uL — AB (ref 4.20–5.70)
RDW: 15.3 % (ref 11.1–15.7)
WBC: 1.2 10*3/uL — AB (ref 4.0–10.0)

## 2015-01-02 LAB — HOLD TUBE, BLOOD BANK - CHCC SATELLITE

## 2015-01-02 MED ORDER — HEPARIN SOD (PORK) LOCK FLUSH 100 UNIT/ML IV SOLN
500.0000 [IU] | Freq: Once | INTRAVENOUS | Status: AC
  Administered 2015-01-02: 500 [IU] via INTRAVENOUS
  Filled 2015-01-02: qty 5

## 2015-01-02 MED ORDER — SODIUM CHLORIDE 0.9 % IJ SOLN
10.0000 mL | INTRAMUSCULAR | Status: DC | PRN
  Administered 2015-01-02: 10 mL via INTRAVENOUS
  Filled 2015-01-02: qty 10

## 2015-01-02 NOTE — Patient Instructions (Signed)

## 2015-01-02 NOTE — Progress Notes (Signed)
Bonnita Hollow presented for Portacath access and flush. Proper placement of portacath confirmed by CXR. Portacath located in the right chest wall accessed with  H 20 needle. Clean, Dry and Intact Good blood return present. Portacath flushed with 37ml NS and 500U/83ml Heparin per protocol and needle removed intact. Procedure without incident. Patient tolerated procedure well.

## 2015-01-03 ENCOUNTER — Ambulatory Visit: Payer: Medicare Other

## 2015-01-06 ENCOUNTER — Encounter: Payer: Self-pay | Admitting: Hematology & Oncology

## 2015-01-06 ENCOUNTER — Other Ambulatory Visit: Payer: Self-pay | Admitting: *Deleted

## 2015-01-06 DIAGNOSIS — D46Z Other myelodysplastic syndromes: Secondary | ICD-10-CM

## 2015-01-10 ENCOUNTER — Other Ambulatory Visit: Payer: Self-pay

## 2015-01-10 ENCOUNTER — Telehealth: Payer: Self-pay

## 2015-01-10 ENCOUNTER — Other Ambulatory Visit: Payer: Self-pay | Admitting: Family

## 2015-01-10 ENCOUNTER — Other Ambulatory Visit (HOSPITAL_BASED_OUTPATIENT_CLINIC_OR_DEPARTMENT_OTHER): Payer: Medicare Other

## 2015-01-10 ENCOUNTER — Ambulatory Visit (HOSPITAL_BASED_OUTPATIENT_CLINIC_OR_DEPARTMENT_OTHER): Payer: Medicare Other

## 2015-01-10 VITALS — BP 117/60 | HR 77 | Temp 99.0°F | Resp 20

## 2015-01-10 DIAGNOSIS — Z452 Encounter for adjustment and management of vascular access device: Secondary | ICD-10-CM | POA: Diagnosis not present

## 2015-01-10 DIAGNOSIS — D4620 Refractory anemia with excess of blasts, unspecified: Secondary | ICD-10-CM

## 2015-01-10 DIAGNOSIS — D46Z Other myelodysplastic syndromes: Secondary | ICD-10-CM

## 2015-01-10 DIAGNOSIS — J0110 Acute frontal sinusitis, unspecified: Secondary | ICD-10-CM

## 2015-01-10 DIAGNOSIS — D4622 Refractory anemia with excess of blasts 2: Secondary | ICD-10-CM | POA: Diagnosis not present

## 2015-01-10 LAB — MANUAL DIFFERENTIAL (CHCC SATELLITE)
ALC: 0.7 10*3/uL — ABNORMAL LOW (ref 0.9–3.3)
ANC (CHCC MAN DIFF): 0.8 10*3/uL — AB (ref 1.5–6.5)
BAND NEUTROPHILS: 1 % (ref 0–10)
LYMPH: 41 % (ref 14–48)
MONO: 11 % (ref 0–13)
Metamyelocytes: 1 % — ABNORMAL HIGH (ref 0–0)
Myelocytes: 1 % — ABNORMAL HIGH (ref 0–0)
NRBC: 1 % — AB (ref 0–0)
PLT EST ~~LOC~~: DECREASED
SEG: 45 % (ref 40–75)

## 2015-01-10 LAB — COMPREHENSIVE METABOLIC PANEL
ALT: 9 U/L (ref 0–55)
AST: 9 U/L (ref 5–34)
Albumin: 3.8 g/dL (ref 3.5–5.0)
Alkaline Phosphatase: 82 U/L (ref 40–150)
Anion Gap: 10 mEq/L (ref 3–11)
BILIRUBIN TOTAL: 0.77 mg/dL (ref 0.20–1.20)
BUN: 18.5 mg/dL (ref 7.0–26.0)
CO2: 22 meq/L (ref 22–29)
CREATININE: 1 mg/dL (ref 0.7–1.3)
Calcium: 8.6 mg/dL (ref 8.4–10.4)
Chloride: 107 mEq/L (ref 98–109)
EGFR: 79 mL/min/{1.73_m2} — AB (ref >90)
GLUCOSE: 96 mg/dL (ref 70–140)
Potassium: 3.5 mEq/L (ref 3.5–5.1)
SODIUM: 139 meq/L (ref 136–145)
TOTAL PROTEIN: 7.2 g/dL (ref 6.4–8.3)

## 2015-01-10 LAB — CBC WITH DIFFERENTIAL (CANCER CENTER ONLY)
HCT: 19.8 % — ABNORMAL LOW (ref 38.7–49.9)
HEMOGLOBIN: 6.4 g/dL — AB (ref 13.0–17.1)
MCH: 26.9 pg — ABNORMAL LOW (ref 28.0–33.4)
MCHC: 32.3 g/dL (ref 32.0–35.9)
MCV: 83 fL (ref 82–98)
PLATELETS: 39 10*3/uL — AB (ref 145–400)
RBC: 2.38 10*6/uL — AB (ref 4.20–5.70)
RDW: 15.7 % (ref 11.1–15.7)
WBC: 1.7 10*3/uL — AB (ref 4.0–10.0)

## 2015-01-10 MED ORDER — HEPARIN SOD (PORK) LOCK FLUSH 100 UNIT/ML IV SOLN
500.0000 [IU] | Freq: Once | INTRAVENOUS | Status: AC
  Administered 2015-01-10: 500 [IU] via INTRAVENOUS
  Filled 2015-01-10: qty 5

## 2015-01-10 MED ORDER — SODIUM CHLORIDE 0.9 % IJ SOLN
10.0000 mL | INTRAMUSCULAR | Status: DC | PRN
  Administered 2015-01-10: 10 mL via INTRAVENOUS
  Filled 2015-01-10: qty 10

## 2015-01-10 MED ORDER — AZITHROMYCIN 250 MG PO TABS
ORAL_TABLET | ORAL | Status: DC
Start: 2015-01-10 — End: 2015-01-12

## 2015-01-10 NOTE — Telephone Encounter (Signed)
Dr Marin Olp aware of critical low hgb. Pt scheduled for blood transfusion tomorrow am. Pt's wife notified by phone. dph

## 2015-01-10 NOTE — Patient Instructions (Signed)

## 2015-01-10 NOTE — Progress Notes (Signed)
Pt presents with swollen right eye, states his sinuses are bothering him.  [place] notified.

## 2015-01-11 ENCOUNTER — Ambulatory Visit (HOSPITAL_BASED_OUTPATIENT_CLINIC_OR_DEPARTMENT_OTHER): Payer: Medicare Other

## 2015-01-11 VITALS — BP 107/58 | HR 71 | Temp 98.2°F | Resp 20

## 2015-01-11 DIAGNOSIS — D4622 Refractory anemia with excess of blasts 2: Secondary | ICD-10-CM | POA: Diagnosis not present

## 2015-01-11 DIAGNOSIS — D696 Thrombocytopenia, unspecified: Secondary | ICD-10-CM

## 2015-01-11 DIAGNOSIS — D4620 Refractory anemia with excess of blasts, unspecified: Secondary | ICD-10-CM

## 2015-01-11 DIAGNOSIS — D46Z Other myelodysplastic syndromes: Secondary | ICD-10-CM

## 2015-01-11 DIAGNOSIS — D649 Anemia, unspecified: Secondary | ICD-10-CM

## 2015-01-11 LAB — PREPARE RBC (CROSSMATCH)

## 2015-01-11 LAB — HOLD TUBE, BLOOD BANK - CHCC SATELLITE

## 2015-01-11 MED ORDER — DIPHENHYDRAMINE HCL 25 MG PO CAPS
ORAL_CAPSULE | ORAL | Status: AC
  Filled 2015-01-11: qty 1

## 2015-01-11 MED ORDER — SODIUM CHLORIDE 0.9 % IV SOLN
250.0000 mL | Freq: Once | INTRAVENOUS | Status: AC
  Administered 2015-01-11: 250 mL via INTRAVENOUS

## 2015-01-11 MED ORDER — ACETAMINOPHEN 325 MG PO TABS
ORAL_TABLET | ORAL | Status: AC
  Filled 2015-01-11: qty 2

## 2015-01-11 MED ORDER — ACETAMINOPHEN 325 MG PO TABS
650.0000 mg | ORAL_TABLET | Freq: Once | ORAL | Status: AC
  Administered 2015-01-11: 650 mg via ORAL

## 2015-01-11 MED ORDER — DIPHENHYDRAMINE HCL 25 MG PO CAPS
25.0000 mg | ORAL_CAPSULE | Freq: Once | ORAL | Status: AC
  Administered 2015-01-11: 25 mg via ORAL

## 2015-01-11 MED ORDER — FUROSEMIDE 10 MG/ML IJ SOLN: 20.0000 mg | Freq: Once | INTRAMUSCULAR | Status: DC

## 2015-01-11 MED ORDER — HEPARIN SOD (PORK) LOCK FLUSH 100 UNIT/ML IV SOLN
500.0000 [IU] | Freq: Every day | INTRAVENOUS | Status: AC | PRN
  Administered 2015-01-11: 500 [IU]
  Filled 2015-01-11: qty 5

## 2015-01-11 MED ORDER — SODIUM CHLORIDE 0.9 % IJ SOLN
10.0000 mL | INTRAMUSCULAR | Status: AC | PRN
  Administered 2015-01-11: 10 mL
  Filled 2015-01-11: qty 10

## 2015-01-11 NOTE — Patient Instructions (Signed)

## 2015-01-12 ENCOUNTER — Other Ambulatory Visit: Payer: Self-pay | Admitting: Family

## 2015-01-12 DIAGNOSIS — J0110 Acute frontal sinusitis, unspecified: Secondary | ICD-10-CM

## 2015-01-12 LAB — TYPE AND SCREEN
ABO/RH(D): A POS
Antibody Screen: NEGATIVE
UNIT DIVISION: 0
Unit division: 0

## 2015-01-12 MED ORDER — AMOXICILLIN-POT CLAVULANATE 250-62.5 MG/5ML PO SUSR: 500.0000 mg | Freq: Two times a day (BID) | ORAL | Status: DC

## 2015-01-16 ENCOUNTER — Encounter: Payer: Self-pay | Admitting: Hematology & Oncology

## 2015-01-18 ENCOUNTER — Ambulatory Visit
Admission: RE | Admit: 2015-01-18 | Discharge: 2015-01-18 | Disposition: A | Discharge status: Home / Self Care | Payer: Medicare Other | Source: Ambulatory Visit | Attending: Interventional Radiology | Admitting: Interventional Radiology

## 2015-01-18 ENCOUNTER — Other Ambulatory Visit (HOSPITAL_BASED_OUTPATIENT_CLINIC_OR_DEPARTMENT_OTHER): Payer: Medicare Other

## 2015-01-18 ENCOUNTER — Other Ambulatory Visit: Payer: Self-pay | Admitting: *Deleted

## 2015-01-18 ENCOUNTER — Ambulatory Visit (HOSPITAL_BASED_OUTPATIENT_CLINIC_OR_DEPARTMENT_OTHER): Payer: Medicare Other

## 2015-01-18 VITALS — BP 120/72 | HR 71 | Temp 98.2°F | Resp 16

## 2015-01-18 DIAGNOSIS — D4620 Refractory anemia with excess of blasts, unspecified: Secondary | ICD-10-CM | POA: Diagnosis present

## 2015-01-18 DIAGNOSIS — D46Z Other myelodysplastic syndromes: Secondary | ICD-10-CM

## 2015-01-18 DIAGNOSIS — I82439 Acute embolism and thrombosis of unspecified popliteal vein: Secondary | ICD-10-CM

## 2015-01-18 LAB — CBC WITH DIFFERENTIAL (CANCER CENTER ONLY)
HCT: 21.2 % — ABNORMAL LOW (ref 38.7–49.9)
HGB: 7 g/dL — ABNORMAL LOW (ref 13.0–17.1)
MCH: 27.1 pg — AB (ref 28.0–33.4)
MCHC: 33 g/dL (ref 32.0–35.9)
MCV: 82 fL (ref 82–98)
PLATELETS: 32 10*3/uL — AB (ref 145–400)
RBC: 2.58 10*6/uL — AB (ref 4.20–5.70)
RDW: 15.3 % (ref 11.1–15.7)
WBC: 1.5 10*3/uL — ABNORMAL LOW (ref 4.0–10.0)

## 2015-01-18 LAB — COMPREHENSIVE METABOLIC PANEL
ALT: 9 U/L (ref 0–55)
ANION GAP: 9 meq/L (ref 3–11)
AST: 8 U/L (ref 5–34)
Albumin: 3.7 g/dL (ref 3.5–5.0)
Alkaline Phosphatase: 77 U/L (ref 40–150)
BILIRUBIN TOTAL: 0.58 mg/dL (ref 0.20–1.20)
BUN: 19 mg/dL (ref 7.0–26.0)
CO2: 23 meq/L (ref 22–29)
Calcium: 8.6 mg/dL (ref 8.4–10.4)
Chloride: 109 mEq/L (ref 98–109)
Creatinine: 0.9 mg/dL (ref 0.7–1.3)
EGFR: 85 mL/min/{1.73_m2} — ABNORMAL LOW (ref >90)
Glucose: 97 mg/dl (ref 70–140)
POTASSIUM: 3.3 meq/L — AB (ref 3.5–5.1)
Sodium: 141 mEq/L (ref 136–145)
TOTAL PROTEIN: 7.2 g/dL (ref 6.4–8.3)

## 2015-01-18 LAB — MANUAL DIFFERENTIAL (CHCC SATELLITE)
ALC: 0.5 10*3/uL — ABNORMAL LOW (ref 0.9–3.3)
ANC (CHCC HP manual diff): 0.7 10*3/uL — ABNORMAL LOW (ref 1.5–6.5)
LYMPH: 35 % (ref 14–48)
MONO: 18 % — ABNORMAL HIGH (ref 0–13)
Metamyelocytes: 1 % — ABNORMAL HIGH (ref 0–0)
NRBC: 1 % — AB (ref 0–0)
PLT EST ~~LOC~~: DECREASED
SEG: 46 % (ref 40–75)

## 2015-01-18 MED ORDER — HEPARIN SOD (PORK) LOCK FLUSH 100 UNIT/ML IV SOLN
500.0000 [IU] | Freq: Once | INTRAVENOUS | Status: AC
  Administered 2015-01-18: 500 [IU] via INTRAVENOUS
  Filled 2015-01-18: qty 5

## 2015-01-18 MED ORDER — SODIUM CHLORIDE 0.9 % IJ SOLN
10.0000 mL | INTRAMUSCULAR | Status: DC | PRN
  Administered 2015-01-18: 10 mL via INTRAVENOUS
  Filled 2015-01-18: qty 10

## 2015-01-18 NOTE — Patient Instructions (Signed)

## 2015-01-18 NOTE — Progress Notes (Signed)
Chief Complaint: Patient was seen in consultation today for No chief complaint on file.  at the request of [place]  Referring Physician(s): McCullough,Heath  History of Present Illness: [name] is a 70 y.o. male who underwent placement of a potentially retrievable IVC filter on 09/18/2014 for acute left popliteal artery thrombus in the setting of myelodysplasia and severe thrombocytopenia. Due to thrombocytopenia he was not considered a suitable candidate for anticoagulation at the time.  He has since done well. He denies lower extremity pain or swelling. He has no abdominal or back pain or discomfort. He is currently asymptomatic.   Past Medical History  Diagnosis Date  . Coronary atherosclerosis of native coronary artery     a. 10/2008 inf STEMI/PCI: LM 50d (IVUS-borderline lesion->med rx), LAD min irregs, LCX 47m 70d, OM nl, RCA 1063m3.5x28 Vision BMS);  b. 11/2008 Lexiscan MV: EF 65%, no isch/scar.  . Essential hypertension, benign   . Pure hypercholesterolemia   . AAA (abdominal aortic aneurysm) (HCIsmay    a. 05/2013 CT: 5.8 cm AAA.  . Marland Kitcheniverticulitis     a. 05/2013 CT: descending/sigmoid jxn w/o abscess.  . Osteoarthritis   . Tobacco abuse     a. ongoing - 1ppd for better part of 50 yrs.  . Normocytic anemia   . Cellulitis 06/10/2013    Right antecubital fossa at site of IV  03/12/13  . PONV (postoperative nausea and vomiting)   . Diverticulitis   . GERD (gastroesophageal reflux disease)   . MDS (myelodysplastic syndrome), high grade (HCBethel3/30/2016  . Myocardial infarction (HCWarsaw2010  . Depression   . Spontaneous pneumothorax 09/14/2014    LEFT LUNG  . Hypotestosteronemia 10/04/2014  . Esophageal ulcer     Past Surgical History  Procedure Laterality Date  . Cardiac stents  2010  . Cholecystectomy N/A 06/19/2013    Procedure: LAPAROSCOPIC CHOLECYSTECTOMY;  Surgeon: DoHarl BowieMD;  [facility] Service: General;  Laterality: N/A;  .  Abdominal aortic endovascular stent graft N/A 07/02/2013    Procedure: ABDOMINAL AORTIC ENDOVASCULAR STENT GRAFT;  Surgeon: VaSerafina MitchellMD;  [facility];  Service: Vascular;  Laterality: N/A;  . Esophagogastroduodenoscopy N/A 08/05/2013    Procedure: ESOPHAGOGASTRODUODENOSCOPY (EGD);  Surgeon: ViLear NgMD;  [facility];  Service: Endoscopy;  Laterality: N/A;  . Eye surgery Left     cataract surgery  . Back surgery      cervical fusion  . Colonoscopy with propofol N/A 10/15/2013    Procedure: COLONOSCOPY WITH PROPOFOL;  Surgeon: ViLear NgMD;  [facility] Service: Endoscopy;  Laterality: N/A;  . Fetal blood transfusion  March 16,17,18, 2016  . Bone marrow biopsy  April 01, 2014  . Chest tube insertion  09/14/2014  . Video assisted thoracoscopy Left 10/11/2014    Procedure: VIDEO ASSISTED THORACOSCOPY;  Surgeon: PeIvin PootMD;  [facility] Service: Thoracic;  Laterality: Left;  . Stapling of blebs Left 10/11/2014    Procedure: STAPLING OF BLEBS;  Surgeon: PeIvin PootMD;  [facility];  Service: Thoracic;  Laterality: Left;  . Esophagogastroduodenoscopy N/A 11/04/2014    Procedure: ESOPHAGOGASTRODUODENOSCOPY (EGD);  Surgeon: JaLaurence SpatesMD;  [facility];  Service: Endoscopy;  Laterality: N/A;    Allergies: Albuterol-ipratropium-soybean lecithin; Cephalexin; Compazine; Percocet; Phenergan; Tramadol; Vicodin; Ativan; Marinol; and Reglan  Medications: Prior to Admission medications   Medication Sig Start Date End Date Taking? Authorizing Provider  acetaminophen (TYLENOL) 500 MG tablet Take 500 mg by mouth every 6 (six) hours as needed for fever.    Historical Provider, MD  amoxicillin-clavulanate (AUGMENTIN) 250-62.5 MG/5ML suspension Take 10 mLs (500 mg total) by mouth 2 (two) times daily. 01/12/15   Eliezer Bottom, NP  [place] alk-PHENObarbital (DONNATAL) 16.2 MG tablet Take 1 tablet by mouth every 8  (eight) hours as needed. 12/23/14   Volanda Napoleon, MD  diphenhydrAMINE (BENADRYL) 25 mg capsule Take 1 capsule (25 mg total) by mouth every 6 (six) hours as needed for itching. May take with Percocet PRN 10/17/14   Donielle Liston Alba, PA-C  lidocaine-prilocaine (EMLA) cream Apply 1 application topically as needed. Please dispense 2 tubes 10/12/14   Volanda Napoleon, MD  metoprolol tartrate (LOPRESSOR) 25 MG tablet Take 0.5 tablets (12.5 mg total) by mouth 2 (two) times daily. 08/03/13   Sherren Mocha, MD  nitroGLYCERIN (NITROSTAT) 0.4 MG SL tablet Place 1 tablet (0.4 mg total) under the tongue every 5 (five) minutes as needed for chest pain. 03/29/14   Sherren Mocha, MD  pantoprazole (PROTONIX) 40 MG tablet Take 1 tablet (40 mg total) by mouth 2 (two) times daily. 11/08/14   Robbie Lis, MD  potassium chloride SA (K-DUR,KLOR-CON) 20 MEQ tablet Take 2 tablets (40 mEq total) by mouth once. 11/17/14   Merrily Pew, MD  simvastatin (ZOCOR) 20 MG tablet Take 1 tablet (20 mg total) by mouth at bedtime. 03/30/14   Sherren Mocha, MD  sucralfate (CARAFATE) 1 GM/10ML suspension Take 10 mLs (1 g total) by mouth 4 (four) times daily -  with meals and at bedtime. 11/08/14   Robbie Lis, MD  temazepam (RESTORIL) 15 MG capsule Take 15 mg by mouth at bedtime as needed for sleep.    Historical Provider, MD     Family History  Problem Relation Age of Onset  . Heart attack Brother     5s  . Cancer Father     Lung  . Cancer Mother     Brain  . Cancer Brother     Kidney    Social History   Social History  . Marital Status: Married    Spouse Name: N/A  . Number of Children: N/A  . Years of Education: N/A   Social History Main Topics  . Smoking status: Former Smoker -- 1.00 packs/day for 60 years    Types: Cigarettes    Start date: 04/05/1964  . Smokeless tobacco: Never Used     Comment: 09/14/2014  . Alcohol Use: No  . Drug Use: No  . Sexual Activity: Not on file   Other Topics Concern  .  Not on file   Social History Narrative   Lives in [place] with wife.  Retired Furniture conservator/restorer.  Works out in the yard often - limited by claudication.  Does not routinely exercise.    Review of Systems: A 12 point ROS discussed and pertinent positives are indicated in the HPI above.  All other systems are negative.  Review of Systems  Vital Signs: There were no vitals taken for this visit.  Physical Exam  Constitutional: He is oriented to person, place, and time. He appears well-developed and well-nourished. No distress.  HENT:  Head: Normocephalic and atraumatic.  Eyes: No scleral icterus.  Pulmonary/Chest: Effort normal. No respiratory distress.  Neurological: He is alert and oriented to person, place, and time.  Skin: Skin is warm and dry.  Psychiatric: He has a normal mood and affect.  His behavior is normal.     Imaging: No results found.  Labs:  CBC:  Recent Labs  12/26/14 0903 01/02/15 1052 01/10/15 1041 01/18/15 0927  WBC 1.2* 1.2* 1.7* 1.5*  HGB 6.1* 7.1* 6.4* 7.0*  HCT 19.2* 22.0* 19.8* 21.2*  PLT 38* 27* 39* 32*    COAGS:  Recent Labs  10/10/14 0540 10/11/14 0552 10/11/14 1300 11/02/14 0443  INR 1.40 1.24 1.32 1.39  APTT 37 29 27 36    BMP:  Recent Labs  11/06/14 0448  11/16/14 2142 11/17/14 1818 11/20/14 1330 11/28/14 1044  12/26/14 0904 01/02/15 1053 01/10/15 1042 01/18/15 0927  NA 138  < > 140 139 141 140  < > 139 140 139 141  K 3.2*  < > 3.4* 3.2* 3.3* 2.7*  < > 3.5 3.6 3.5 3.3*  CL 104  < > 109 108 108 104  --   --   --   --   --   CO2 28  < > '23 22 23 24  '$ < > 21* '24 22 23  '$ GLUCOSE 92  < > 127* 114* 135* 108  < > 109 99 96 97  BUN 20  < > '20 17 18 14  '$ < > 17.8 14.0 18.5 19.0  CALCIUM 8.5*  < > 9.0 8.9 9.1 8.7  < > 8.8 8.6 8.6 8.6  CREATININE 1.15  < > 1.18 1.17 1.19 0.9  < > 1.2 1.0 1.0 0.9  GFRNONAA >60  --  >60 >60 >60  --   --   --   --   --   --   GFRAA >60  --  >60 >60 >60  --   --   --   --   --   --   < > = values in this  interval not displayed.  LIVER FUNCTION TESTS:  Recent Labs  12/26/14 0904 01/02/15 1053 01/10/15 1042 01/18/15 0927  BILITOT 0.57 0.53 0.77 0.58  AST '8 9 9 8  '$ ALT <9 '9 9 9  '$ ALKPHOS 78 80 82 77  PROT 7.2 7.0 7.2 7.2  ALBUMIN 3.8 3.7 3.8 3.7    TUMOR MARKERS: No results for input(s): AFPTM, CEA, CA199, CHROMGRNA in the last 8760 hours.  Assessment and Plan:  Mr. Zapien left popliteal DVT has completely resolved without anticoagulation. He is currently asymptomatic regarding his IVC filter.  We had a thorough discussion today regarding the risks and benefits of leaving the IVC filter in place versus removing it. Although he does not have a true malignancy per se, he does have myelodysplastic syndrome which is at risk for converting to malignancy in the future. Additionally, he has likely going to have multiple transfusions in the future and will remain chronically thrombocytopenic. If he ever were to develop a DVT again, he would likely require replacement of the filter should it be removed.  At the end of our conversation Mr. Matton made the informed decision to leave the IVC filter in place. I respect his decision and do not think that it is unreasonable.  We will not actively pursue further follow-up of his IVC filter for retrievable at this time. If he ever develops symptoms in the future or changes his mind we are always here for him and we'll be happy to see him again in consultation.  SignedJacqulynn Cadet 01/18/2015, 2:32 PM   I spent a total of  10 Minutes in face to face in clinical consultation, greater than 50%  of which was counseling/coordinating care for IVC filter.

## 2015-01-18 NOTE — Progress Notes (Signed)
[name] presented for Portacath access and flush. Proper placement of portacath confirmed by CXR. Portacath located in the right chest wall accessed with  H 20 needle. Clean, Dry and Intact Good blood return present. Portacath flushed with 46ml NS and 500U/23ml Heparin per protocol and needle removed intact. Procedure without incident. Patient tolerated procedure well.

## 2015-01-24 ENCOUNTER — Other Ambulatory Visit: Payer: Medicare Other

## 2015-01-25 ENCOUNTER — Ambulatory Visit (INDEPENDENT_AMBULATORY_CARE_PROVIDER_SITE_OTHER): Payer: Medicare Other | Admitting: Nurse Practitioner

## 2015-01-25 ENCOUNTER — Encounter: Payer: Self-pay | Admitting: Nurse Practitioner

## 2015-01-25 VITALS — BP 130/64 | HR 64 | Ht 70.0 in | Wt 165.8 lb

## 2015-01-25 DIAGNOSIS — I10 Essential (primary) hypertension: Secondary | ICD-10-CM | POA: Diagnosis not present

## 2015-01-25 DIAGNOSIS — E785 Hyperlipidemia, unspecified: Secondary | ICD-10-CM | POA: Diagnosis not present

## 2015-01-25 DIAGNOSIS — I259 Chronic ischemic heart disease, unspecified: Secondary | ICD-10-CM | POA: Diagnosis not present

## 2015-01-25 LAB — HEPATIC FUNCTION PANEL
ALT: 8 U/L — ABNORMAL LOW (ref 9–46)
AST: 11 U/L (ref 10–35)
Albumin: 4.1 g/dL (ref 3.6–5.1)
Alkaline Phosphatase: 72 U/L (ref 40–115)
Bilirubin, Direct: 0.1 mg/dL (ref <=0.2)
Indirect Bilirubin: 0.6 mg/dL (ref 0.2–1.2)
Total Bilirubin: 0.7 mg/dL (ref 0.2–1.2)
Total Protein: 7 g/dL (ref 6.1–8.1)

## 2015-01-25 LAB — LIPID PANEL
Cholesterol: 72 mg/dL — ABNORMAL LOW (ref 125–200)
HDL: 28 mg/dL — ABNORMAL LOW (ref >=40)
LDL Cholesterol: 22 mg/dL (ref <130)
Total CHOL/HDL Ratio: 2.6 Ratio (ref <=5.0)
Triglycerides: 111 mg/dL (ref <150)
VLDL: 22 mg/dL (ref <30)

## 2015-01-25 NOTE — Progress Notes (Signed)
CARDIOLOGY OFFICE NOTE  Date:  01/25/2015    Bonnita Hollow Date of Birth: 1945-02-20 Medical Record #[id number]  PCP:  Shirline Frees, MD  Cardiologist:  Burt Knack    Chief Complaint  Patient presents with  . Coronary Artery Disease    Follow up visit - seen for Dr. Burt Knack  . Hyperlipidemia  . Hypertension    History of Present Illness: [name] is a 70 y.o. male who presents today for a 10 month check. Seen for Dr. Burt Knack.   He initially presented with an inferior wall MI in 2010. The patient was treated with primary PCI of the right coronary artery. He has moderate distal left main stem stenosis and moderate left circumflex stenosis. He's been treated medically. Followup intravascular ultrasound was performed. The patient underwent a stress nuclear study that showed no significant ischemia. His LV function has been normal. In 2015 he underwent EVAR for a AAA by Dr Trula Slade. Follow-up CTA imaging has documented a good result.  Last seen here last March - having leg pain and chest pain. Referred for repeat cardiac cath but pre op lab showed marked anemia - study was cancelled. He was referred on to the ER. Looks like this turned out to be myelodysplasia. Followed by hematology. Has had a spontaneous left pneumothorax as well - required VATS repair.   Comes back today. Here with his wife. Has had shingles as well as macular degeneration since he was last seen here. Says he is "coming back". Fatigues easily. No chest pain. BP low. Finished chemo back in August - now getting periodical transfusions. Blood is checked every week but no recent lipids. No metoprolol taken in over 2 months. He has lost considerable weight since last here. They have gotten a new puppy which has brought them both much happiness.   Past Medical History  Diagnosis Date  . Coronary atherosclerosis of native coronary artery     a. 10/2008 inf STEMI/PCI: LM 50d (IVUS-borderline lesion->med rx), LAD min  irregs, LCX 60m 70d, OM nl, RCA 1036m3.5x28 Vision BMS);  b. 11/2008 Lexiscan MV: EF 65%, no isch/scar.  . Essential hypertension, benign   . Pure hypercholesterolemia   . AAA (abdominal aortic aneurysm) (HCLuz[place]    a. 05/2013 CT: 5.8 cm AAA.  . Marland Kitcheniverticulitis     a. 05/2013 CT: descending/sigmoid jxn w/o abscess.  . Osteoarthritis   . Tobacco abuse     a. ongoing - 1ppd for better part of 50 yrs.  . Normocytic anemia   . Cellulitis 06/10/2013    Right antecubital fossa at site of IV  03/12/13  . PONV (postoperative nausea and vomiting)   . Diverticulitis   . GERD (gastroesophageal reflux disease)   . MDS (myelodysplastic syndrome), high grade (HCFolcroft3/30/2016  . Myocardial infarction (HCGobles2010  . Depression   . Spontaneous pneumothorax 09/14/2014    LEFT LUNG  . Hypotestosteronemia 10/04/2014  . Esophageal ulcer     Past Surgical History  Procedure Laterality Date  . Cardiac stents  2010  . Cholecystectomy N/A 06/19/2013    Procedure: LAPAROSCOPIC CHOLECYSTECTOMY;  Surgeon: DoHarl BowieMD;  [facility]: [facility];  Laterality: N/A;  . Abdominal aortic endovascular stent graft N/A 07/02/2013    Procedure: ABDOMINAL AORTIC ENDOVASCULAR STENT GRAFT;  Surgeon: VaSerafina MitchellMD;  [facility];  Service: Vascular;  Laterality: N/A;  . Esophagogastroduodenoscopy N/A 08/05/2013    Procedure: ESOPHAGOGASTRODUODENOSCOPY (EGD);  Surgeon: ViEvette Doffing  Aloha Gell, MD;  Location: [facility];  Service: Endoscopy;  Laterality: N/A;  . Eye surgery Left     cataract surgery  . Back surgery      cervical fusion  . Colonoscopy with propofol N/A 10/15/2013    Procedure: COLONOSCOPY WITH PROPOFOL;  Surgeon: Lear Ng, MD;  Location: [facility];  Service: Endoscopy;  Laterality: N/A;  . Fetal blood transfusion  March 16,17,18, 2016  . Bone marrow biopsy  April 01, 2014  . Chest tube insertion  09/14/2014  . Video assisted thoracoscopy Left 10/11/2014    Procedure: VIDEO  ASSISTED THORACOSCOPY;  Surgeon: Ivin Poot, MD;  Location: [place];  Service: Thoracic;  Laterality: Left;  . Stapling of blebs Left 10/11/2014    Procedure: STAPLING OF BLEBS;  Surgeon: Ivin Poot, MD;  [facility];  Service: Thoracic;  Laterality: Left;  . Esophagogastroduodenoscopy N/A 11/04/2014    Procedure: ESOPHAGOGASTRODUODENOSCOPY (EGD);  Surgeon: Laurence Spates, MD;  [facility];  Service: Endoscopy;  Laterality: N/A;     Medications: Current Outpatient Prescriptions  Medication Sig Dispense Refill  . acetaminophen (TYLENOL) 500 MG tablet Take 500 mg by mouth every 6 (six) hours as needed for fever.    [place] alk-PHENObarbital (DONNATAL) 16.2 MG tablet Take 1 tablet by mouth every 8 (eight) hours as needed. 120 tablet 2  . diphenhydrAMINE (BENADRYL) 25 mg capsule Take 1 capsule (25 mg total) by mouth every 6 (six) hours as needed for itching. May take with Percocet PRN 30 capsule 0  . lidocaine-prilocaine (EMLA) cream Apply 1 application topically as needed. Please dispense 2 tubes 60 g 3  . Multiple Vitamins-Minerals (PRESERVISION AREDS 2 PO) Take by mouth 2 (two) times daily.    . nitroGLYCERIN (NITROSTAT) 0.4 MG SL tablet Place 1 tablet (0.4 mg total) under the tongue every 5 (five) minutes as needed for chest pain. 25 tablet 6  . pantoprazole (PROTONIX) 40 MG tablet Take 40 mg by mouth at bedtime.    . potassium chloride SA (K-DUR,KLOR-CON) 20 MEQ tablet Take 20 mEq by mouth as needed.    . predniSONE (DELTASONE) 20 MG tablet Take 20 mg by mouth as needed.    . simvastatin (ZOCOR) 20 MG tablet Take 1 tablet (20 mg total) by mouth at bedtime. 30 tablet 5  . sucralfate (CARAFATE) 1 GM/10ML suspension Take 10 mLs (1 g total) by mouth 4 (four) times daily -  with meals and at bedtime. (Patient taking differently: Take 1 g by mouth 4 (four) times daily -  with meals and at bedtime. Takes as needed) 420 mL 0  . temazepam (RESTORIL) 15 MG capsule Take 15 mg by  mouth at bedtime as needed for sleep.    . metoprolol tartrate (LOPRESSOR) 25 MG tablet Take 0.5 tablets (12.5 mg total) by mouth 2 (two) times daily. (Patient not taking: Reported on 01/25/2015) 90 tablet 3   No current facility-administered medications for this visit.    Allergies: Allergies  Allergen Reactions  . Albuterol-Ipratropium-Soybean Lecithin [Ipratropium-Albuterol] Anxiety  . Cephalexin Other (See Comments)    Nausea and stomach cramps  . Compazine [Prochlorperazine] Anxiety  . Percocet [Oxycodone-Acetaminophen] Itching    Can take with benadryl    . Phenergan [Promethazine Hcl] Other (See Comments)    "restless legs"  . Tramadol Other (See Comments)    "jerking limbs and talking in his sleep"  . Vicodin [Hydrocodone-Acetaminophen] Itching    Can take with benadryl   . Ativan [  Lorazepam] Other (See Comments)    Shaking and hallucinations  . Marinol [Dronabinol] Other (See Comments)    "Affects his eyes"  . Reglan [Metoclopramide] Other (See Comments)    Patient has significant shakes that are troubling. Would not like to receive this if there are other options available.    Social History: The patient  reports that he has quit smoking. His smoking use included Cigarettes. He started smoking about 50 years ago. He has a 60 pack-year smoking history. He has never used smokeless tobacco. He reports that he does not drink alcohol or use illicit drugs.   Family History: The patient's family history includes Cancer in his brother, father, and mother; Heart attack in his brother.   Review of Systems: Please see the history of present illness.   Otherwise, the review of systems is positive for none.   All other systems are reviewed and negative.   Physical Exam: VS:  BP 130/64 mmHg  Pulse 64  Ht '5\' 10"'$  (1.778 m)  Wt 165 lb 12.8 oz (75.206 kg)  BMI 23.79 kg/m2 .  BMI Body mass index is 23.79 kg/(m^2).  Wt Readings from Last 3 Encounters:  01/25/15 165 lb 12.8 oz  (75.206 kg)  12/26/14 163 lb (73.936 kg)  12/05/14 161 lb (73.029 kg)   He weighed 200 pounds at his last visit here with Dr. Burt Knack  General: Pleasant. Looks chronically ill but in no acute distress.  Neck: Supple, no JVD, carotid bruits, or masses noted.  Cardiac: Regular rate and rhythm. Soft outflow murmur noted. No edema.  Respiratory:  Lungs are clear to auscultation bilaterally with normal work of breathing.  MS: No deformity or atrophy. Gait and ROM intact. Skin: Warm and dry. Color is sallow Neuro:  Strength and sensation are intact and no gross focal deficits noted.  Psych: Alert, appropriate and with normal affect.   LABORATORY DATA:  EKG:  EKG is not ordered today.  Lab Results  Component Value Date   WBC 1.5* 01/18/2015   HGB 7.0* 01/18/2015   HCT 21.2* 01/18/2015   PLT 32* 01/18/2015   GLUCOSE 97 01/18/2015   CHOL 93 05/26/2014   TRIG 127.0 05/26/2014   HDL 28.80* 05/26/2014   LDLDIRECT 55.2 10/28/2012   LDLCALC 39 05/26/2014   ALT 9 01/18/2015   AST 8 01/18/2015   NA 141 01/18/2015   K 3.3* 01/18/2015   CL 104 11/28/2014   CREATININE 0.9 01/18/2015   BUN 19.0 01/18/2015   CO2 23 01/18/2015   TSH 1.550 06/05/2013   INR 1.39 11/02/2014   HGBA1C 5.6 06/05/2013    BNP (last 3 results) No results for input(s): BNP in the last 8760 hours.  ProBNP (last 3 results) No results for input(s): PROBNP in the last 8760 hours.   Other Studies Reviewed Today:  Cardiac Studies Reviewed: Myoview scan 06/10/2013: FINDINGS: SPECT: Inferior wall attenuation artifact. No perfusion defects. No stress-induced ischemia.  Wall motion: Normal motion.  Ejection fraction: 63%. End-diastolic volume 517 cc. End systolic volume 47 cc.  IMPRESSION: Applying for a inferior wall attenuation artifact, there are no perfusion defects and there is no evidence of stress-induced ischemia.  ASSESSMENT AND PLAN: 1. CAD - no active chest pain - managed medically. Has  not taken his metoprolol in several months due to low BP - will remove from his list. His cardiac status is stable.   2. HTN: off medicines.  3. AAA - s/p EVAR. followed by Dr Trula Slade.  4. Hyperlipidemia -  on statin therapy - needs lipids checked today  5. Prior tobacco abuse  6. Myelodysplasia - followed closely by hematology/oncology.   Current medicines are reviewed with the patient today.  The patient does not have concerns regarding medicines other than what has been noted above.  The following changes have been made:  See above.  Labs/ tests ordered today include:   No orders of the defined types were placed in this encounter.     Disposition:   FU with Dr. Burt Knack in 9 months.   Patient is agreeable to this plan and will call if any problems develop in the interim.   Signed: Burtis Junes, RN, ANP-C 01/25/2015 10:54 AM  [facility] [address] [place], [place]  [address] Phone: [phone] Fax: [phone]

## 2015-01-25 NOTE — Patient Instructions (Addendum)
We will be checking the following labs today - lipids and LFTs   Medication Instructions:    Continue with your current medicines.   I have taken the metoprolol off your med list - would not restart    Testing/Procedures To Be Arranged:  N/A  Follow-Up:   See Dr. Burt Knack in 9 months.     Other Special Instructions:   N/A    If you need a refill on your cardiac medications before your next appointment, please call your pharmacy.   Call the [place] office at [phone] if you have any questions, problems or concerns.

## 2015-01-26 ENCOUNTER — Ambulatory Visit (HOSPITAL_COMMUNITY)
Admission: RE | Admit: 2015-01-26 | Discharge: 2015-01-26 | Disposition: A | Discharge status: Home / Self Care | Payer: Medicare Other | Source: Ambulatory Visit | Attending: Hematology & Oncology | Admitting: Hematology & Oncology

## 2015-01-26 ENCOUNTER — Other Ambulatory Visit: Payer: Self-pay | Admitting: Family

## 2015-01-26 ENCOUNTER — Telehealth: Payer: Self-pay | Admitting: *Deleted

## 2015-01-26 ENCOUNTER — Other Ambulatory Visit: Payer: Self-pay | Admitting: *Deleted

## 2015-01-26 ENCOUNTER — Ambulatory Visit (HOSPITAL_BASED_OUTPATIENT_CLINIC_OR_DEPARTMENT_OTHER): Payer: Medicare Other

## 2015-01-26 ENCOUNTER — Other Ambulatory Visit (HOSPITAL_BASED_OUTPATIENT_CLINIC_OR_DEPARTMENT_OTHER): Payer: Medicare Other

## 2015-01-26 VITALS — BP 113/47 | HR 80 | Temp 97.4°F

## 2015-01-26 DIAGNOSIS — D46Z Other myelodysplastic syndromes: Secondary | ICD-10-CM

## 2015-01-26 DIAGNOSIS — Z452 Encounter for adjustment and management of vascular access device: Secondary | ICD-10-CM | POA: Diagnosis present

## 2015-01-26 DIAGNOSIS — D4620 Refractory anemia with excess of blasts, unspecified: Secondary | ICD-10-CM

## 2015-01-26 DIAGNOSIS — D4622 Refractory anemia with excess of blasts 2: Secondary | ICD-10-CM | POA: Insufficient documentation

## 2015-01-26 LAB — MANUAL DIFFERENTIAL (CHCC SATELLITE)
ALC: 0.3 10*3/uL — ABNORMAL LOW (ref 0.9–3.3)
ANC (CHCC HP manual diff): 1.2 10*3/uL — ABNORMAL LOW (ref 1.5–6.5)
BAND NEUTROPHILS: 2 % (ref 0–10)
LYMPH: 19 % (ref 14–48)
METAMYELOCYTES PCT: 3 % — AB (ref 0–0)
MONO: 6 % (ref 0–13)
PLT EST ~~LOC~~: DECREASED
SEG: 70 % (ref 40–75)
nRBC: 1 % — ABNORMAL HIGH (ref 0–0)

## 2015-01-26 LAB — COMPREHENSIVE METABOLIC PANEL
ALBUMIN: 3.7 g/dL (ref 3.5–5.0)
ALK PHOS: 76 U/L (ref 40–150)
ALT: 11 U/L (ref 0–55)
AST: 8 U/L (ref 5–34)
Anion Gap: 8 mEq/L (ref 3–11)
BUN: 21.1 mg/dL (ref 7.0–26.0)
CALCIUM: 8.7 mg/dL (ref 8.4–10.4)
CO2: 23 mEq/L (ref 22–29)
Chloride: 110 mEq/L — ABNORMAL HIGH (ref 98–109)
Creatinine: 1 mg/dL (ref 0.7–1.3)
EGFR: 75 mL/min/{1.73_m2} — ABNORMAL LOW (ref >90)
Glucose: 131 mg/dl (ref 70–140)
POTASSIUM: 4.1 meq/L (ref 3.5–5.1)
Sodium: 140 mEq/L (ref 136–145)
Total Bilirubin: 0.54 mg/dL (ref 0.20–1.20)
Total Protein: 7 g/dL (ref 6.4–8.3)

## 2015-01-26 LAB — CBC WITH DIFFERENTIAL (CANCER CENTER ONLY)
HEMATOCRIT: 19.7 % — AB (ref 38.7–49.9)
HEMOGLOBIN: 6.2 g/dL — AB (ref 13.0–17.1)
MCH: 26.2 pg — AB (ref 28.0–33.4)
MCHC: 31.5 g/dL — AB (ref 32.0–35.9)
MCV: 83 fL (ref 82–98)
Platelets: 47 10*3/uL — ABNORMAL LOW (ref 145–400)
RBC: 2.37 10*6/uL — ABNORMAL LOW (ref 4.20–5.70)
RDW: 15.9 % — ABNORMAL HIGH (ref 11.1–15.7)
WBC: 1.7 10*3/uL — ABNORMAL LOW (ref 4.0–10.0)

## 2015-01-26 LAB — CHCC SATELLITE - SMEAR

## 2015-01-26 MED ORDER — HEPARIN SOD (PORK) LOCK FLUSH 100 UNIT/ML IV SOLN
500.0000 [IU] | Freq: Once | INTRAVENOUS | Status: AC
  Administered 2015-01-26: 500 [IU] via INTRAVENOUS
  Filled 2015-01-26: qty 5

## 2015-01-26 MED ORDER — SODIUM CHLORIDE 0.9 % IJ SOLN
10.0000 mL | INTRAMUSCULAR | Status: DC | PRN
  Administered 2015-01-26: 10 mL via INTRAVENOUS
  Filled 2015-01-26: qty 10

## 2015-01-26 NOTE — Telephone Encounter (Signed)
Critical Value HGB 6.2 Dr Marin Olp notified. Orders received. Patient scheduled at [facility]  for transfusion on Saturday.

## 2015-01-26 NOTE — Patient Instructions (Signed)
Implanted Port Insertion, Care After °Refer to this sheet in the next few weeks. These instructions provide you with information on caring for yourself after your procedure. Your health care provider may also give you more specific instructions. Your treatment has been planned according to current medical practices, but problems sometimes occur. Call your health care provider if you have any problems or questions after your procedure. °WHAT TO EXPECT AFTER THE PROCEDURE °After your procedure, it is typical to have the following:  °· Discomfort at the port insertion site. Ice packs to the area will help. °· Bruising on the skin over the port. This will subside in 3-4 days. °HOME CARE INSTRUCTIONS °· After your port is placed, you will get a manufacturer's information card. The card has information about your port. Keep this card with you at all times.   °· Know what kind of port you have. There are many types of ports available.   °· Wear a medical alert bracelet in case of an emergency. This can help alert health care workers that you have a port.   °· The port can stay in for as long as your health care provider believes it is necessary.   °· A home health care nurse may give medicines and take care of the port.   °· You or a family member can get special training and directions for giving medicine and taking care of the port at home.   °SEEK MEDICAL CARE IF:  °· Your port does not flush or you are unable to get a blood return.   °· You have a fever or chills. °SEEK IMMEDIATE MEDICAL CARE IF: °· You have new fluid or pus coming from your incision.   °· You notice a bad smell coming from your incision site.   °· You have swelling, pain, or more redness at the incision or port site.   °· You have chest pain or shortness of breath. °  °This information is not intended to replace advice given to you by your health care provider. Make sure you discuss any questions you have with your health care provider. °  °Document  Released: 10/21/2012 Document Revised: 01/05/2013 Document Reviewed: 10/21/2012 °Elsevier Interactive Patient Education ©2016 [employer] ° °

## 2015-01-27 ENCOUNTER — Encounter: Payer: Self-pay | Admitting: Cardiovascular Disease

## 2015-01-27 ENCOUNTER — Ambulatory Visit: Payer: Medicare Other | Admitting: Hematology & Oncology

## 2015-01-27 ENCOUNTER — Encounter: Payer: Self-pay | Admitting: Hematology & Oncology

## 2015-01-27 ENCOUNTER — Telehealth: Payer: Self-pay | Admitting: *Deleted

## 2015-01-27 ENCOUNTER — Other Ambulatory Visit: Payer: Medicare Other

## 2015-01-27 DIAGNOSIS — D4622 Refractory anemia with excess of blasts 2: Secondary | ICD-10-CM | POA: Diagnosis not present

## 2015-01-27 LAB — IRON AND TIBC
%SAT: 23 % (ref 20–55)
Iron: 47 ug/dL (ref 42–163)
TIBC: 206 ug/dL (ref 202–409)
UIBC: 159 ug/dL (ref 117–376)

## 2015-01-27 LAB — RETICULOCYTES: Reticulocyte Count: 0.9 % (ref 0.6–2.6)

## 2015-01-27 LAB — FERRITIN: Ferritin: 1068 ng/ml — ABNORMAL HIGH (ref 22–316)

## 2015-01-27 NOTE — Telephone Encounter (Signed)
I s/w wife who states they just filled out a DPR at Dr. Antonieta Pert office so it should be getting scanned in soon. Wife aware of lab results by phone with verbal understanding.

## 2015-01-28 ENCOUNTER — Encounter: Payer: Self-pay | Admitting: Hematology & Oncology

## 2015-01-28 ENCOUNTER — Ambulatory Visit (HOSPITAL_BASED_OUTPATIENT_CLINIC_OR_DEPARTMENT_OTHER): Payer: Medicare Other

## 2015-01-28 VITALS — BP 103/57 | HR 66 | Temp 98.7°F | Resp 18

## 2015-01-28 DIAGNOSIS — D46Z Other myelodysplastic syndromes: Secondary | ICD-10-CM

## 2015-01-28 DIAGNOSIS — D4620 Refractory anemia with excess of blasts, unspecified: Secondary | ICD-10-CM | POA: Diagnosis present

## 2015-01-28 DIAGNOSIS — D4622 Refractory anemia with excess of blasts 2: Secondary | ICD-10-CM | POA: Diagnosis not present

## 2015-01-28 DIAGNOSIS — Z95828 Presence of other vascular implants and grafts: Secondary | ICD-10-CM

## 2015-01-28 LAB — PREPARE RBC (CROSSMATCH)

## 2015-01-28 MED ORDER — SODIUM CHLORIDE 0.9 % IV SOLN
250.0000 mL | Freq: Once | INTRAVENOUS | Status: AC
  Administered 2015-01-28: 250 mL via INTRAVENOUS

## 2015-01-28 MED ORDER — SODIUM CHLORIDE 0.9 % IJ SOLN
10.0000 mL | INTRAMUSCULAR | Status: DC | PRN
Start: 2015-01-28 — End: 2015-01-28
  Administered 2015-01-28: 10 mL via INTRAVENOUS
  Filled 2015-01-28: qty 10

## 2015-01-28 MED ORDER — DIPHENHYDRAMINE HCL 25 MG PO CAPS
25.0000 mg | ORAL_CAPSULE | Freq: Once | ORAL | Status: AC
  Administered 2015-01-28: 25 mg via ORAL

## 2015-01-28 MED ORDER — FUROSEMIDE 10 MG/ML IJ SOLN
INTRAMUSCULAR | Status: AC
  Filled 2015-01-28: qty 2

## 2015-01-28 MED ORDER — FUROSEMIDE 10 MG/ML IJ SOLN
20.0000 mg | Freq: Once | INTRAMUSCULAR | Status: AC
  Administered 2015-01-28: 20 mg via INTRAVENOUS

## 2015-01-28 MED ORDER — ACETAMINOPHEN 325 MG PO TABS
650.0000 mg | ORAL_TABLET | Freq: Once | ORAL | Status: AC
  Administered 2015-01-28: 650 mg via ORAL

## 2015-01-28 MED ORDER — DIPHENHYDRAMINE HCL 25 MG PO CAPS
ORAL_CAPSULE | ORAL | Status: AC
  Filled 2015-01-28: qty 1

## 2015-01-28 MED ORDER — ACETAMINOPHEN 325 MG PO TABS
ORAL_TABLET | ORAL | Status: AC
  Filled 2015-01-28: qty 2

## 2015-01-28 MED ORDER — FUROSEMIDE 20 MG PO TABS
ORAL_TABLET | ORAL | Status: AC
  Filled 2015-01-28: qty 1

## 2015-01-28 MED ORDER — HEPARIN SOD (PORK) LOCK FLUSH 100 UNIT/ML IV SOLN
500.0000 [IU] | Freq: Once | INTRAVENOUS | Status: AC
Start: 2015-01-28 — End: 2015-01-28
  Administered 2015-01-28: 500 [IU] via INTRAVENOUS
  Filled 2015-01-28: qty 5

## 2015-01-28 NOTE — Patient Instructions (Signed)

## 2015-01-30 LAB — TYPE AND SCREEN
ABO/RH(D): A POS
Antibody Screen: NEGATIVE
Unit division: 0

## 2015-01-31 ENCOUNTER — Other Ambulatory Visit: Payer: Self-pay | Admitting: *Deleted

## 2015-01-31 ENCOUNTER — Other Ambulatory Visit: Payer: Self-pay

## 2015-01-31 DIAGNOSIS — D46Z Other myelodysplastic syndromes: Secondary | ICD-10-CM

## 2015-02-01 ENCOUNTER — Ambulatory Visit (HOSPITAL_BASED_OUTPATIENT_CLINIC_OR_DEPARTMENT_OTHER): Payer: Medicare Other | Admitting: Hematology & Oncology

## 2015-02-01 ENCOUNTER — Encounter: Payer: Self-pay | Admitting: Hematology & Oncology

## 2015-02-01 ENCOUNTER — Other Ambulatory Visit (HOSPITAL_BASED_OUTPATIENT_CLINIC_OR_DEPARTMENT_OTHER): Payer: Medicare Other

## 2015-02-01 ENCOUNTER — Ambulatory Visit (HOSPITAL_BASED_OUTPATIENT_CLINIC_OR_DEPARTMENT_OTHER): Payer: Medicare Other

## 2015-02-01 ENCOUNTER — Other Ambulatory Visit: Payer: Medicare Other

## 2015-02-01 ENCOUNTER — Other Ambulatory Visit: Payer: Self-pay | Admitting: *Deleted

## 2015-02-01 ENCOUNTER — Telehealth: Payer: Self-pay | Admitting: *Deleted

## 2015-02-01 VITALS — BP 116/58 | HR 81 | Temp 98.4°F | Resp 18 | Ht 70.0 in | Wt 166.0 lb

## 2015-02-01 DIAGNOSIS — Z452 Encounter for adjustment and management of vascular access device: Secondary | ICD-10-CM

## 2015-02-01 DIAGNOSIS — D46Z Other myelodysplastic syndromes: Secondary | ICD-10-CM

## 2015-02-01 DIAGNOSIS — D4622 Refractory anemia with excess of blasts 2: Secondary | ICD-10-CM

## 2015-02-01 DIAGNOSIS — L0211 Cutaneous abscess of neck: Secondary | ICD-10-CM

## 2015-02-01 LAB — MANUAL DIFFERENTIAL (CHCC SATELLITE)
ALC: 0.4 10*3/uL — AB (ref 0.9–3.3)
ANC (CHCC HP manual diff): 1.6 10*3/uL (ref 1.5–6.5)
Band Neutrophils: 5 % (ref 0–10)
Blasts: 2 % — ABNORMAL HIGH (ref 0–0)
LYMPH: 14 % (ref 14–48)
MONO: 23 % — ABNORMAL HIGH (ref 0–13)
MYELOCYTES: 1 % — AB (ref 0–0)
PLT EST ~~LOC~~: DECREASED
SEG: 55 % (ref 40–75)

## 2015-02-01 LAB — CBC WITH DIFFERENTIAL (CANCER CENTER ONLY)
HCT: 21.3 % — ABNORMAL LOW (ref 38.7–49.9)
HGB: 6.7 g/dL — CL (ref 13.0–17.1)
MCH: 26.6 pg — AB (ref 28.0–33.4)
MCHC: 31.5 g/dL — AB (ref 32.0–35.9)
MCV: 85 fL (ref 82–98)
PLATELETS: 41 10*3/uL — AB (ref 145–400)
RBC: 2.52 10*6/uL — ABNORMAL LOW (ref 4.20–5.70)
RDW: 16.6 % — ABNORMAL HIGH (ref 11.1–15.7)
WBC: 2.7 10*3/uL — ABNORMAL LOW (ref 4.0–10.0)

## 2015-02-01 MED ORDER — SODIUM CHLORIDE 0.9 % IJ SOLN
10.0000 mL | INTRAMUSCULAR | Status: DC | PRN
  Administered 2015-02-01: 10 mL via INTRAVENOUS
  Filled 2015-02-01: qty 10

## 2015-02-01 MED ORDER — HEPARIN SOD (PORK) LOCK FLUSH 100 UNIT/ML IV SOLN
500.0000 [IU] | Freq: Once | INTRAVENOUS | Status: AC
  Administered 2015-02-01: 500 [IU] via INTRAVENOUS
  Filled 2015-02-01: qty 5

## 2015-02-01 NOTE — Telephone Encounter (Signed)
Critical Value HGB 6.7 Dr Marin Olp notified. Orders will be placed.

## 2015-02-01 NOTE — Patient Instructions (Signed)

## 2015-02-01 NOTE — Progress Notes (Signed)
Hematology and Oncology Follow Up Visit  [name] [id number] 1945/02/20 70 y.o. 02/01/2015   Principle Diagnosis:   RAEB-2 (normal cytogenetics with mutated ASXL-1, TET2, U2AF1 genes)  Post-Herpetic neuralgia of the right T9 dermatome  Recurrent spontaneous left pneumothorax-status post VATS repair  Hypotestosteronemia  Current Therapy:       Interim History:  [name] is back for follow-up. He really looks good. He says he feels good. He's not having any abdominal pain. He does have the postherpetic neuralgia in the right T9 dermatome but uses a Lidoderm patch for this.    His wife is to undergo cardiac cath tomorrow. She had a positive stress test. Hopefully, she will be able to have any blockages stented.  He is not having any cough. The numbness in the left side of his chest is doing a lot better now.  He's had no bleeding. He's had no change in bowel or bladder habits. He's had no leg swelling.  His appetite is doing better. He is eating better. He's not losing weight. Donnatal really helps him out.  He was last transfused a week ago. This seemed to make him feel a little bit better.  He does not want any further therapy for his myelodysplasia. He says he just feels terrible when he takes treatment and his quality of life is what is important.   Overall, his performance status is ECOG 1.  Medications:  Current outpatient prescriptions:  .  acetaminophen (TYLENOL) 500 MG tablet, Take 500 mg by mouth every 6 (six) hours as needed for fever., Disp: , Rfl:  .  belladona alk-PHENObarbital (DONNATAL) 16.2 MG tablet, Take 1 tablet by mouth every 8 (eight) hours as needed., Disp: 120 tablet, Rfl: 2 .  diphenhydrAMINE (BENADRYL) 25 mg capsule, Take 1 capsule (25 mg total) by mouth every 6 (six) hours as needed for itching. May take with Percocet PRN, Disp: 30 capsule, Rfl: 0 .  lidocaine-prilocaine (EMLA) cream, Apply 1 application topically as needed. Please dispense 2  tubes, Disp: 60 g, Rfl: 3 .  Multiple Vitamins-Minerals (PRESERVISION AREDS 2 PO), Take by mouth 2 (two) times daily., Disp: , Rfl:  .  nitroGLYCERIN (NITROSTAT) 0.4 MG SL tablet, Place 1 tablet (0.4 mg total) under the tongue every 5 (five) minutes as needed for chest pain., Disp: 25 tablet, Rfl: 6 .  pantoprazole (PROTONIX) 40 MG tablet, Take 1 tablet (40 mg total) by mouth 2 (two) times daily., Disp: , Rfl:  .  potassium chloride SA (K-DUR,KLOR-CON) 20 MEQ tablet, Take 20 mEq by mouth as needed., Disp: , Rfl:  .  predniSONE (DELTASONE) 20 MG tablet, Take 20 mg by mouth as needed., Disp: , Rfl:  .  simvastatin (ZOCOR) 20 MG tablet, Take 1 tablet (20 mg total) by mouth at bedtime., Disp: 30 tablet, Rfl: 5 .  sucralfate (CARAFATE) 1 GM/10ML suspension, Take 10 mLs (1 g total) by mouth 4 (four) times daily -  with meals and at bedtime. (Patient taking differently: Take 1 g by mouth 4 (four) times daily -  with meals and at bedtime. Takes as needed), Disp: 420 mL, Rfl: 0 .  temazepam (RESTORIL) 15 MG capsule, Take 15 mg by mouth at bedtime as needed for sleep., Disp: , Rfl:  No current facility-administered medications for this visit.  Facility-Administered Medications Ordered in Other Visits:  .  sodium chloride 0.9 % injection 10 mL, 10 mL, Intravenous, PRN, Volanda Napoleon, MD, 10 mL at 02/01/15 1500  Allergies:  Allergies  Allergen Reactions  . Albuterol-Ipratropium-Soybean Lecithin [Ipratropium-Albuterol] Anxiety  . Cephalexin Other (See Comments)    Nausea and stomach cramps  . Compazine [Prochlorperazine] Anxiety  . Percocet [Oxycodone-Acetaminophen] Itching    Can take with benadryl    . Phenergan [Promethazine Hcl] Other (See Comments)    "restless legs"  . Tramadol Other (See Comments)    "jerking limbs and talking in his sleep"  . Vicodin [Hydrocodone-Acetaminophen] Itching    Can take with benadryl   . Ativan [Lorazepam] Other (See Comments)    Shaking and hallucinations  .  Marinol [Dronabinol] Other (See Comments)    "Affects his eyes"  . Reglan [Metoclopramide] Other (See Comments)    Patient has significant shakes that are troubling. Would not like to receive this if there are other options available.    Past Medical History, Surgical history, Social history, and Family History were reviewed and updated.  Review of Systems: As above  Physical Exam:  height is '5\' 10"'$  (1.778 m) and weight is 166 lb (75.297 kg). His oral temperature is 98.4 F (36.9 C). His blood pressure is 116/58 and his pulse is 81. His respiration is 18.   Wt Readings from Last 3 Encounters:  02/01/15 166 lb (75.297 kg)  01/25/15 165 lb 12.8 oz (75.206 kg)  12/26/14 163 lb (73.936 kg)     Well-developed and well-nourished white gentleman in no obvious distress. Head and neck exam shows no ocular or oral lesions. There are no palpable cervical or supraclavicular lymph does. Lungs are clear. Cardiac exam regular rate and rhythm with no murmurs, rubs or bruits. Abdomen is soft. Has good bowel sounds. There is no fluid wave. There is no palpable liver or spleen tip. Back exam shows no tenderness over the spine, ribs or hips. Extremities shows no clubbing, cyanosis or edema. Skin exam shows the healed  herpetic rash in the right T9 dermatome. He has drying of the vesicles. He has some bleeding into the vesicles. Neurological exam is nonfocal.  Lab Results  Component Value Date   WBC 2.7* 02/01/2015   HGB 6.7* 02/01/2015   HCT 21.3* 02/01/2015   MCV 85 02/01/2015   PLT 41* 02/01/2015     Chemistry      Component Value Date/Time   NA 140 01/26/2015 1345   NA 140 11/28/2014 1044   NA 141 11/20/2014 1330   K 4.1 01/26/2015 1345   K 2.7* 11/28/2014 1044   K 3.3* 11/20/2014 1330   CL 104 11/28/2014 1044   CL 108 11/20/2014 1330   CO2 23 01/26/2015 1345   CO2 24 11/28/2014 1044   CO2 23 11/20/2014 1330   BUN 21.1 01/26/2015 1345   BUN 14 11/28/2014 1044   BUN 18 11/20/2014 1330     CREATININE 1.0 01/26/2015 1345   CREATININE 0.9 11/28/2014 1044   CREATININE 1.19 11/20/2014 1330      Component Value Date/Time   CALCIUM 8.7 01/26/2015 1345   CALCIUM 8.7 11/28/2014 1044   CALCIUM 9.1 11/20/2014 1330   ALKPHOS 76 01/26/2015 1345   ALKPHOS 72 01/25/2015 1119   ALKPHOS 79 11/28/2014 1044   AST 8 01/26/2015 1345   AST 11 01/25/2015 1119   AST 14 11/28/2014 1044   ALT 11 01/26/2015 1345   ALT 8* 01/25/2015 1119   ALT 17 11/28/2014 1044   BILITOT 0.54 01/26/2015 1345   BILITOT 0.7 01/25/2015 1119   BILITOT 0.80 11/28/2014 1044  Impression and Plan: Mr. Mcclintock is 70 year old gentleman with myelodysplasia. He has normal cytogenetics. We did do the NGS of his blood which showed some abnormalities. Again, these might indicate an increased risk of transformation but it is not definitive.  I'm glad that his quality of life is doing better. He looks better.  He thinks that he may benefit from some blood this weekend.  We are watching his iron studies. So far, iron overload has not been an issue for him. His last iron saturation was only 23%. This was a week ago. The ferritin was elevated at thousand but this may be an acute phase reactant.  For now, we will just let him come back when he needs to. He will S now when he thinks he needs to have a blood transfusion.  I probably will see him back in about 6-8 weeks myself.  I spent about 25 minutes with him today. Volanda Napoleon, MD 1/18/20175:11 PM

## 2015-02-01 NOTE — Progress Notes (Signed)
[name] presented for Portacath access and flush. Proper placement of portacath confirmed by CXR. Portacath located in the right chest wall accessed with  H 20 needle. Clean, Dry and Intact Good blood return present. Portacath flushed with 74ml NS and 500U/39ml Heparin per protocol and needle removed intact. Procedure without incident. Patient tolerated procedure well.

## 2015-02-01 NOTE — Telephone Encounter (Signed)
This RN received call at 430 pm stating need to schedule blood transfusion this Saturday.   Due to caregivers need to have surgical procedure tomorrow with recovery on Friday this RN scheduled appointment for appropriate communication.

## 2015-02-02 ENCOUNTER — Other Ambulatory Visit: Payer: Self-pay | Admitting: *Deleted

## 2015-02-02 LAB — COMPREHENSIVE METABOLIC PANEL (CC13)
ALBUMIN: 4.1 g/dL (ref 3.6–4.8)
ALK PHOS: 74 IU/L (ref 39–117)
ALT: 9 IU/L (ref 0–44)
AST: 8 IU/L (ref 0–40)
Albumin/Globulin Ratio: 1.4 (ref 1.1–2.5)
BILIRUBIN TOTAL: 0.7 mg/dL (ref 0.0–1.2)
BUN / CREAT RATIO: 21 (ref 10–22)
BUN: 20 mg/dL (ref 8–27)
CHLORIDE: 103 mmol/L (ref 96–106)
Calcium, Ser: 8.3 mg/dL — ABNORMAL LOW (ref 8.6–10.2)
Carbon Dioxide, Total: 23 mmol/L (ref 18–29)
Creatinine, Ser: 0.97 mg/dL (ref 0.76–1.27)
GFR calc Af Amer: 92 mL/min/{1.73_m2} (ref >59)
GFR calc non Af Amer: 80 mL/min/{1.73_m2} (ref >59)
GLOBULIN, TOTAL: 3 g/dL (ref 1.5–4.5)
Glucose: 134 mg/dL — ABNORMAL HIGH (ref 65–99)
POTASSIUM: 4.1 mmol/L (ref 3.5–5.2)
SODIUM: 133 mmol/L — AB (ref 134–144)
Total Protein: 7.1 g/dL (ref 6.0–8.5)

## 2015-02-02 MED ORDER — MUPIROCIN 2 % EX OINT: TOPICAL_OINTMENT | CUTANEOUS | Status: AC

## 2015-02-02 MED ORDER — AMOXICILLIN-POT CLAVULANATE 875-125 MG PO TABS: 1.0000 | ORAL_TABLET | Freq: Two times a day (BID) | ORAL | Status: DC

## 2015-02-02 MED ORDER — DOXYCYCLINE HYCLATE 100 MG PO TABS: 100.0000 mg | ORAL_TABLET | Freq: Two times a day (BID) | ORAL | Status: DC

## 2015-02-02 NOTE — Addendum Note (Signed)
Addended by: Burney Gauze R on: 02/02/2015 06:20 AM   Modules accepted: Orders

## 2015-02-04 ENCOUNTER — Ambulatory Visit (HOSPITAL_BASED_OUTPATIENT_CLINIC_OR_DEPARTMENT_OTHER): Payer: Medicare Other

## 2015-02-04 VITALS — BP 120/63 | HR 69 | Temp 99.4°F | Resp 18

## 2015-02-04 DIAGNOSIS — D46Z Other myelodysplastic syndromes: Secondary | ICD-10-CM

## 2015-02-04 DIAGNOSIS — D649 Anemia, unspecified: Secondary | ICD-10-CM | POA: Diagnosis not present

## 2015-02-04 DIAGNOSIS — D696 Thrombocytopenia, unspecified: Secondary | ICD-10-CM

## 2015-02-04 DIAGNOSIS — D4620 Refractory anemia with excess of blasts, unspecified: Secondary | ICD-10-CM | POA: Diagnosis present

## 2015-02-04 DIAGNOSIS — D4622 Refractory anemia with excess of blasts 2: Secondary | ICD-10-CM | POA: Diagnosis not present

## 2015-02-04 LAB — PREPARE RBC (CROSSMATCH)

## 2015-02-04 MED ORDER — ACETAMINOPHEN 325 MG PO TABS
ORAL_TABLET | ORAL | Status: AC
  Filled 2015-02-04: qty 2

## 2015-02-04 MED ORDER — HEPARIN SOD (PORK) LOCK FLUSH 100 UNIT/ML IV SOLN
500.0000 [IU] | Freq: Every day | INTRAVENOUS | Status: AC | PRN
  Administered 2015-02-04: 500 [IU]
  Filled 2015-02-04: qty 5

## 2015-02-04 MED ORDER — ACETAMINOPHEN 325 MG PO TABS
650.0000 mg | ORAL_TABLET | Freq: Once | ORAL | Status: AC
  Administered 2015-02-04: 650 mg via ORAL

## 2015-02-04 MED ORDER — FUROSEMIDE 10 MG/ML IJ SOLN
20.0000 mg | Freq: Once | INTRAMUSCULAR | Status: DC
Start: 2015-02-04 — End: 2015-02-04

## 2015-02-04 MED ORDER — SODIUM CHLORIDE 0.9 % IV SOLN
250.0000 mL | Freq: Once | INTRAVENOUS | Status: AC
  Administered 2015-02-04: 250 mL via INTRAVENOUS

## 2015-02-04 MED ORDER — SODIUM CHLORIDE 0.9 % IJ SOLN
10.0000 mL | INTRAMUSCULAR | Status: AC | PRN
  Administered 2015-02-04: 10 mL
  Filled 2015-02-04: qty 10

## 2015-02-04 NOTE — Patient Instructions (Signed)
Blood Transfusion   A blood transfusion is a procedure that gives you donated blood through an IV tube. You may need blood because of illness, surgery, or injury. The blood may come from a donor. The blood may also be your own blood that you donated earlier.  The blood you get is made up of different types of cells. You may get:    Red blood cells. These carry oxygen and replace lost blood.    Platelets. These control bleeding.    Plasma. This helps blood to clot.  If you have a clotting disorder, you may also get other types of blood products.   BEFORE THE PROCEDURE   You may have a blood test. This finds out what type of blood you have. It also finds out what kind of blood your body will accept.    If you are going to have a planned surgery, you may donate your own blood. This is done in case you need to have a transfusion.    If you have had an allergic transfusion reaction before, you may be given medicine to help prevent a reaction. Take this medicine only as told by your doctor.   You will have your temperature, blood pressure, and pulse checked.  PROCEDURE    An IV will be started in your hand or arm.    The bag of donated blood will be attached to your IV and run into your vein.    A doctor will regularly check your temperature, blood pressure, and pulse during the procedure. This is done to find any early signs of a transfusion reaction.   If you have any signs or symptoms of a reaction, the procedure may be stopped and you may be given medicine.    When the transfusion is over, your IV will be removed.    Pressure may be applied to the IV site for a few minutes.    A bandage (dressing) will be applied.   The procedure may vary among doctors and hospitals.   AFTER THE PROCEDURE   Your blood pressure, temperature, and pulse will be checked regularly.     This information is not intended to replace advice given to you by your health care provider. Make sure you discuss any questions  you have with your health care provider.     Document Released: 03/29/2008 Document Revised: 01/21/2014 Document Reviewed: 11/10/2013  Elsevier Interactive Patient Education 2016 [employer]

## 2015-02-06 ENCOUNTER — Other Ambulatory Visit (HOSPITAL_BASED_OUTPATIENT_CLINIC_OR_DEPARTMENT_OTHER): Payer: Medicare Other

## 2015-02-06 ENCOUNTER — Ambulatory Visit (HOSPITAL_BASED_OUTPATIENT_CLINIC_OR_DEPARTMENT_OTHER): Payer: Medicare Other

## 2015-02-06 VITALS — BP 136/99 | HR 72 | Temp 98.2°F | Resp 18

## 2015-02-06 DIAGNOSIS — D469 Myelodysplastic syndrome, unspecified: Secondary | ICD-10-CM

## 2015-02-06 DIAGNOSIS — D4620 Refractory anemia with excess of blasts, unspecified: Secondary | ICD-10-CM

## 2015-02-06 DIAGNOSIS — L0211 Cutaneous abscess of neck: Secondary | ICD-10-CM

## 2015-02-06 DIAGNOSIS — D46Z Other myelodysplastic syndromes: Secondary | ICD-10-CM

## 2015-02-06 LAB — CMP (CANCER CENTER ONLY)
ALBUMIN: 3.7 g/dL (ref 3.3–5.5)
ALT(SGPT): 14 U/L (ref 10–47)
AST: 17 U/L (ref 11–38)
Alkaline Phosphatase: 61 U/L (ref 26–84)
BILIRUBIN TOTAL: 1.1 mg/dL (ref 0.20–1.60)
BUN, Bld: 17 mg/dL (ref 7–22)
CO2: 25 mEq/L (ref 18–33)
CREATININE: 1 mg/dL (ref 0.6–1.2)
Calcium: 8.7 mg/dL (ref 8.0–10.3)
Chloride: 103 mEq/L (ref 98–108)
Glucose, Bld: 99 mg/dL (ref 73–118)
Potassium: 3.6 mEq/L (ref 3.3–4.7)
SODIUM: 135 meq/L (ref 128–145)
TOTAL PROTEIN: 7.1 g/dL (ref 6.4–8.1)

## 2015-02-06 LAB — MANUAL DIFFERENTIAL (CHCC SATELLITE)
ALC: 0.9 10*3/uL (ref 0.9–3.3)
ANC (CHCC MAN DIFF): 1.5 10*3/uL (ref 1.5–6.5)
Band Neutrophils: 1 % (ref 0–10)
Blasts: 7 % — ABNORMAL HIGH (ref 0–0)
LYMPH: 27 % (ref 14–48)
MONO: 22 % — ABNORMAL HIGH (ref 0–13)
NRBC: 1 % — AB (ref 0–0)
PLT EST ~~LOC~~: DECREASED
SEG: 43 % (ref 40–75)

## 2015-02-06 LAB — TYPE AND SCREEN
ABO/RH(D): A POS
ANTIBODY SCREEN: NEGATIVE
UNIT DIVISION: 0
Unit division: 0

## 2015-02-06 LAB — CBC WITH DIFFERENTIAL (CANCER CENTER ONLY)
HCT: 26.6 % — ABNORMAL LOW (ref 38.7–49.9)
HEMOGLOBIN: 8.8 g/dL — AB (ref 13.0–17.1)
MCH: 27.8 pg — AB (ref 28.0–33.4)
MCHC: 33.1 g/dL (ref 32.0–35.9)
MCV: 84 fL (ref 82–98)
Platelets: 26 10*3/uL — ABNORMAL LOW (ref 145–400)
RBC: 3.17 10*6/uL — AB (ref 4.20–5.70)
RDW: 16.4 % — ABNORMAL HIGH (ref 11.1–15.7)
WBC: 3.4 10*3/uL — ABNORMAL LOW (ref 4.0–10.0)

## 2015-02-06 MED ORDER — HEPARIN SOD (PORK) LOCK FLUSH 100 UNIT/ML IV SOLN
500.0000 [IU] | Freq: Once | INTRAVENOUS | Status: AC
  Administered 2015-02-06: 500 [IU] via INTRAVENOUS
  Filled 2015-02-06: qty 5

## 2015-02-06 MED ORDER — SODIUM CHLORIDE 0.9 % IJ SOLN
10.0000 mL | INTRAMUSCULAR | Status: DC | PRN
  Administered 2015-02-06: 10 mL via INTRAVENOUS
  Filled 2015-02-06: qty 10

## 2015-02-06 NOTE — Patient Instructions (Signed)

## 2015-02-10 ENCOUNTER — Other Ambulatory Visit: Payer: Self-pay | Admitting: *Deleted

## 2015-02-10 DIAGNOSIS — D46Z Other myelodysplastic syndromes: Secondary | ICD-10-CM

## 2015-02-12 ENCOUNTER — Encounter: Payer: Self-pay | Admitting: Hematology & Oncology

## 2015-02-13 ENCOUNTER — Ambulatory Visit: Payer: Medicare Other

## 2015-02-13 ENCOUNTER — Other Ambulatory Visit (HOSPITAL_BASED_OUTPATIENT_CLINIC_OR_DEPARTMENT_OTHER): Payer: Medicare Other

## 2015-02-13 ENCOUNTER — Ambulatory Visit (HOSPITAL_BASED_OUTPATIENT_CLINIC_OR_DEPARTMENT_OTHER)
Admission: RE | Admit: 2015-02-13 | Discharge: 2015-02-13 | Disposition: A | Discharge status: Home / Self Care | Payer: Medicare Other | Source: Ambulatory Visit | Attending: Hematology & Oncology | Admitting: Hematology & Oncology

## 2015-02-13 ENCOUNTER — Other Ambulatory Visit: Payer: Self-pay | Admitting: Hematology & Oncology

## 2015-02-13 ENCOUNTER — Ambulatory Visit (HOSPITAL_BASED_OUTPATIENT_CLINIC_OR_DEPARTMENT_OTHER): Payer: Medicare Other | Admitting: Hematology & Oncology

## 2015-02-13 DIAGNOSIS — D469 Myelodysplastic syndrome, unspecified: Secondary | ICD-10-CM | POA: Diagnosis present

## 2015-02-13 DIAGNOSIS — C9200 Acute myeloblastic leukemia, not having achieved remission: Secondary | ICD-10-CM | POA: Diagnosis not present

## 2015-02-13 DIAGNOSIS — R1012 Left upper quadrant pain: Secondary | ICD-10-CM | POA: Insufficient documentation

## 2015-02-13 DIAGNOSIS — R161 Splenomegaly, not elsewhere classified: Secondary | ICD-10-CM

## 2015-02-13 DIAGNOSIS — D46Z Other myelodysplastic syndromes: Secondary | ICD-10-CM

## 2015-02-13 LAB — COMPREHENSIVE METABOLIC PANEL
ALT: 10 U/L (ref 0–55)
ANION GAP: 12 meq/L — AB (ref 3–11)
AST: 8 U/L (ref 5–34)
Albumin: 3.8 g/dL (ref 3.5–5.0)
Alkaline Phosphatase: 67 U/L (ref 40–150)
BUN: 23 mg/dL (ref 7.0–26.0)
CHLORIDE: 109 meq/L (ref 98–109)
CO2: 20 meq/L — AB (ref 22–29)
Calcium: 8.8 mg/dL (ref 8.4–10.4)
Creatinine: 1.2 mg/dL (ref 0.7–1.3)
EGFR: 65 mL/min/{1.73_m2} — AB (ref >90)
Glucose: 94 mg/dl (ref 70–140)
POTASSIUM: 3.8 meq/L (ref 3.5–5.1)
Sodium: 141 mEq/L (ref 136–145)
Total Bilirubin: 0.57 mg/dL (ref 0.20–1.20)
Total Protein: 7.1 g/dL (ref 6.4–8.3)

## 2015-02-13 LAB — MANUAL DIFFERENTIAL (CHCC SATELLITE)
ALC: 1.3 10*3/uL (ref 0.9–3.3)
ANC (CHCC MAN DIFF): 1 10*3/uL — AB (ref 1.5–6.5)
Blasts: 6 % — ABNORMAL HIGH (ref 0–0)
LYMPH: 41 % (ref 14–48)
MONO: 20 % — ABNORMAL HIGH (ref 0–13)
Myelocytes: 1 % — ABNORMAL HIGH (ref 0–0)
PLT EST ~~LOC~~: DECREASED
SEG: 32 % — AB (ref 40–75)

## 2015-02-13 LAB — CBC WITH DIFFERENTIAL (CANCER CENTER ONLY)
HCT: 23.2 % — ABNORMAL LOW (ref 38.7–49.9)
HGB: 7.4 g/dL — ABNORMAL LOW (ref 13.0–17.1)
MCH: 27 pg — ABNORMAL LOW (ref 28.0–33.4)
MCHC: 31.9 g/dL — ABNORMAL LOW (ref 32.0–35.9)
MCV: 85 fL (ref 82–98)
Platelets: 23 10*3/uL — ABNORMAL LOW (ref 145–400)
RBC: 2.74 10*6/uL — AB (ref 4.20–5.70)
RDW: 17.2 % — ABNORMAL HIGH (ref 11.1–15.7)
WBC: 3.1 10*3/uL — AB (ref 4.0–10.0)

## 2015-02-13 MED ORDER — SODIUM CHLORIDE 0.9% FLUSH
10.0000 mL | INTRAVENOUS | Status: DC | PRN
  Administered 2015-02-13: 10 mL via INTRAVENOUS
  Filled 2015-02-13: qty 10

## 2015-02-13 MED ORDER — HEPARIN SOD (PORK) LOCK FLUSH 100 UNIT/ML IV SOLN
500.0000 [IU] | Freq: Once | INTRAVENOUS | Status: AC
  Administered 2015-02-13: 500 [IU] via INTRAVENOUS
  Filled 2015-02-13: qty 5

## 2015-02-13 NOTE — Progress Notes (Signed)
Hematology and Oncology Follow Up Visit  [name] [id number] 1945-10-10 70 y.o. 02/13/2015   Principle Diagnosis:   RAEB-2 (normal cytogenetics with mutated ASXL-1, TET2, U2AF1 genes)  Post-Herpetic neuralgia of the right T9 dermatome  Recurrent spontaneous left pneumothorax-status post VATS repair  Hypotestosteronemia  Current Therapy:       Interim History:  [name] is back for an unscheduled visit. He has been complaining of pain in the left side. This was going on for about a week or so.  He's had no obvious bleeding. He's had no change in bowel bladder habits. There's not been any nausea area  He had a blood transfusion I think of weeks ago. We saw him recently and a single low was looking pretty good.  He's had no cough or shortness of breath. He's had no leg swelling. He's had no abdominal discomfort also that with the left side.  I did go ahead and get a ultrasound of his abdomen. Surprising enough, this now shows new splenomegaly. The spleen measures 18.6 x 18.1 x 9.6 cm. This is a volume of 1691 ML. This is new.  I were by the fact that he might be transforming over to acute leukemia. Another possibility might be the traverse into some kind of myeloproliferative syndrome.  He clearly needs to have a bone marrow biopsy done. Last was done back in July 2016.  His appetite seems be doing pretty well. He is I'll let hungry. His weight is down a little bit.  I talked to his wife today by the phone. She is worried that he is becoming more feeble.   Overall, his performance status is ECOG 1.   Medications:  Current outpatient prescriptions:  .  acetaminophen (TYLENOL) 500 MG tablet, Take 500 mg by mouth every 6 (six) hours as needed for fever., Disp: , Rfl:  .  belladona alk-PHENObarbital (DONNATAL) 16.2 MG tablet, Take 1 tablet by mouth every 8 (eight) hours as needed., Disp: 120 tablet, Rfl: 2 .  diphenhydrAMINE (BENADRYL) 25 mg capsule, Take 1 capsule (25 mg  total) by mouth every 6 (six) hours as needed for itching. May take with Percocet PRN, Disp: 30 capsule, Rfl: 0 .  doxycycline (VIBRA-TABS) 100 MG tablet, Take 1 tablet (100 mg total) by mouth 2 (two) times daily. For 7 days., Disp: 14 tablet, Rfl: 0 .  lidocaine-prilocaine (EMLA) cream, Apply 1 application topically as needed. Please dispense 2 tubes, Disp: 60 g, Rfl: 3 .  Multiple Vitamins-Minerals (PRESERVISION AREDS 2 PO), Take by mouth 2 (two) times daily., Disp: , Rfl:  .  mupirocin ointment (BACTROBAN) 2 %, Apply 3 times a day to affected area under RIGHT ear., Disp: 22 g, Rfl: 0 .  nitroGLYCERIN (NITROSTAT) 0.4 MG SL tablet, Place 1 tablet (0.4 mg total) under the tongue every 5 (five) minutes as needed for chest pain., Disp: 25 tablet, Rfl: 6 .  pantoprazole (PROTONIX) 40 MG tablet, Take 1 tablet (40 mg total) by mouth 2 (two) times daily., Disp: , Rfl:  .  potassium chloride SA (K-DUR,KLOR-CON) 20 MEQ tablet, Take 20 mEq by mouth as needed., Disp: , Rfl:  .  predniSONE (DELTASONE) 20 MG tablet, Take 20 mg by mouth as needed., Disp: , Rfl:  .  simvastatin (ZOCOR) 20 MG tablet, Take 1 tablet (20 mg total) by mouth at bedtime., Disp: 30 tablet, Rfl: 5 .  sucralfate (CARAFATE) 1 GM/10ML suspension, Take 10 mLs (1 g total) by mouth 4 (four) times daily -  with meals and at bedtime. (Patient taking differently: Take 1 g by mouth 4 (four) times daily -  with meals and at bedtime. Takes as needed), Disp: 420 mL, Rfl: 0 .  temazepam (RESTORIL) 15 MG capsule, Take 15 mg by mouth at bedtime as needed for sleep., Disp: , Rfl:   Allergies:  Allergies  Allergen Reactions  . Albuterol-Ipratropium-Soybean Lecithin [Ipratropium-Albuterol] Anxiety  . Cephalexin Other (See Comments)    Nausea and stomach cramps  . Compazine [Prochlorperazine] Anxiety  . Percocet [Oxycodone-Acetaminophen] Itching    Can take with benadryl    . Phenergan [Promethazine Hcl] Other (See Comments)    "restless legs"  .  Tramadol Other (See Comments)    "jerking limbs and talking in his sleep"  . Vicodin [Hydrocodone-Acetaminophen] Itching    Can take with benadryl   . Ativan [Lorazepam] Other (See Comments)    Shaking and hallucinations  . Marinol [Dronabinol] Other (See Comments)    "Affects his eyes"  . Reglan [Metoclopramide] Other (See Comments)    Patient has significant shakes that are troubling. Would not like to receive this if there are other options available.    Past Medical History, Surgical history, Social history, and Family History were reviewed and updated.  Review of Systems: As above  Physical Exam:  vitals were not taken for this visit.  Wt Readings from Last 3 Encounters:  02/01/15 166 lb (75.297 kg)  01/25/15 165 lb 12.8 oz (75.206 kg)  12/26/14 163 lb (73.936 kg)     Well-developed and well-nourished white gentleman in no obvious distress. Head and neck exam shows no ocular or oral lesions. There are no palpable cervical or supraclavicular lymph does. Lungs are clear. Cardiac exam regular rate and rhythm with no murmurs, rubs or bruits. Abdomen is soft. Has good bowel sounds. There is no fluid wave. There is no palpable liver or spleen tip. Back exam shows no tenderness over the spine, ribs or hips. Extremities shows no clubbing, cyanosis or edema. Skin exam shows the healed  herpetic rash in the right T9 dermatome. He has drying of the vesicles. He has some bleeding into the vesicles. Neurological exam is nonfocal.  Lab Results  Component Value Date   WBC 3.1* 02/13/2015   HGB 7.4* 02/13/2015   HCT 23.2* 02/13/2015   MCV 85 02/13/2015   PLT 23* 02/13/2015     Chemistry      Component Value Date/Time   NA 141 02/13/2015 0915   NA 135 02/06/2015 0923   NA 133* 02/01/2015 1442   NA 141 11/20/2014 1330   K 3.8 02/13/2015 0915   K 3.6 02/06/2015 0923   K 4.1 02/01/2015 1442   K 3.3* 11/20/2014 1330   CL 103 02/06/2015 0923   CL 103 02/01/2015 1442   CL 108  11/20/2014 1330   CO2 20* 02/13/2015 0915   CO2 25 02/06/2015 0923   CO2 23 02/01/2015 1442   CO2 23 11/20/2014 1330   BUN 23.0 02/13/2015 0915   BUN 17 02/06/2015 0923   BUN 20 02/01/2015 1442   BUN 18 11/20/2014 1330   CREATININE 1.2 02/13/2015 0915   CREATININE 1.0 02/06/2015 0923   CREATININE 0.97 02/01/2015 1442   CREATININE 1.19 11/20/2014 1330      Component Value Date/Time   CALCIUM 8.8 02/13/2015 0915   CALCIUM 8.7 02/06/2015 0923   CALCIUM 8.3* 02/01/2015 1442   CALCIUM 9.1 11/20/2014 1330   ALKPHOS 67 02/13/2015 0915   ALKPHOS 61 02/06/2015  0923   ALKPHOS 74 02/01/2015 1442   ALKPHOS 72 01/25/2015 1119   AST 8 02/13/2015 0915   AST 17 02/06/2015 0923   AST 8 02/01/2015 1442   AST 11 01/25/2015 1119   ALT 10 02/13/2015 0915   ALT 14 02/06/2015 0923   ALT 9 02/01/2015 1442   ALT 8* 01/25/2015 1119   BILITOT 0.57 02/13/2015 0915   BILITOT 1.10 02/06/2015 0923   BILITOT 0.7 02/01/2015 1442   BILITOT 0.7 01/25/2015 1119         Impression and Plan: Mr. Zollner is 70 year old gentleman with myelodysplasia. He has normal cytogenetics. We did do the NGS of his blood which showed some abnormalities. Again, these might indicate an increased risk of transformation but it is not definitive.  I am worried about the splenomegaly. This Liliane Channel troubles me.  I looked at his blood smear. He may have a slightly increased number of blasts.  I think with the bone marrow test, cytogenetics will be critical.  We will plan to set him up for next Tuesday-for voice seventh. I think this will tell us what is going on.  I spent about 30 minutes with him. I then spent about 20 minutes on the phone with his wife  Volanda Napoleon, MD 1/30/20175:50 PM

## 2015-02-13 NOTE — Patient Instructions (Signed)
Implanted Port Insertion, Care After °Refer to this sheet in the next few weeks. These instructions provide you with information on caring for yourself after your procedure. Your health care provider may also give you more specific instructions. Your treatment has been planned according to current medical practices, but problems sometimes occur. Call your health care provider if you have any problems or questions after your procedure. °WHAT TO EXPECT AFTER THE PROCEDURE °After your procedure, it is typical to have the following:  °· Discomfort at the port insertion site. Ice packs to the area will help. °· Bruising on the skin over the port. This will subside in 3-4 days. °HOME CARE INSTRUCTIONS °· After your port is placed, you will get a manufacturer's information card. The card has information about your port. Keep this card with you at all times.   °· Know what kind of port you have. There are many types of ports available.   °· Wear a medical alert bracelet in case of an emergency. This can help alert health care workers that you have a port.   °· The port can stay in for as long as your health care provider believes it is necessary.   °· A home health care nurse may give medicines and take care of the port.   °· You or a family member can get special training and directions for giving medicine and taking care of the port at home.   °SEEK MEDICAL CARE IF:  °· Your port does not flush or you are unable to get a blood return.   °· You have a fever or chills. °SEEK IMMEDIATE MEDICAL CARE IF: °· You have new fluid or pus coming from your incision.   °· You notice a bad smell coming from your incision site.   °· You have swelling, pain, or more redness at the incision or port site.   °· You have chest pain or shortness of breath. °  °This information is not intended to replace advice given to you by your health care provider. Make sure you discuss any questions you have with your health care provider. °  °Document  Released: 10/21/2012 Document Revised: 01/05/2013 Document Reviewed: 10/21/2012 °Elsevier Interactive Patient Education ©2016 [employer] ° °

## 2015-02-13 NOTE — Progress Notes (Signed)
Patient having progressive pain on right side of his abdomen.  Dr. Marin Olp notified.  Dr. Marin Olp examined patient in a room. Ultrasound ordered.

## 2015-02-14 ENCOUNTER — Encounter: Payer: Self-pay | Admitting: Hematology & Oncology

## 2015-02-15 ENCOUNTER — Telehealth: Payer: Self-pay | Admitting: *Deleted

## 2015-02-15 ENCOUNTER — Encounter: Payer: Self-pay | Admitting: *Deleted

## 2015-02-15 ENCOUNTER — Encounter: Payer: Self-pay | Admitting: Hematology & Oncology

## 2015-02-15 ENCOUNTER — Other Ambulatory Visit: Payer: Self-pay | Admitting: *Deleted

## 2015-02-15 MED ORDER — OXYCODONE-ACETAMINOPHEN 7.5-325 MG PO TABS: 1.0000 | ORAL_TABLET | ORAL | Status: AC | PRN

## 2015-02-15 MED FILL — OXYCODONE-APAP 7.5/325MG: 7.5-325 | 20 days supply | Qty: 240 | Fill #0

## 2015-02-15 NOTE — Telephone Encounter (Signed)
Patient states that his pain to his left side is getting worse. He is concerned due to his splenomegaly. He is currently taking percocet, one tab, q4h prn with out significant relief. He wants to know if he should go to the emergency room.  Spoke to Dr Marin Olp. He states patient can take 1-2 tablets every 4hprn pain. If this doesn't help, patient can be seen at the ED for more powerful pain control options. New prescription will be ready for pick up today.  Patient aware. He will try the new dose of pain medication and if unsuccessful, will go to the ED. Wife will pick up new prescription later today.

## 2015-02-16 ENCOUNTER — Inpatient Hospital Stay (HOSPITAL_COMMUNITY)
Admission: EM | Admit: 2015-02-16 | Discharge: 2015-03-15 | DRG: 820 | Disposition: E | Payer: Medicare Other | Attending: Internal Medicine | Admitting: Internal Medicine

## 2015-02-16 ENCOUNTER — Encounter (HOSPITAL_COMMUNITY): Payer: Self-pay | Admitting: Emergency Medicine

## 2015-02-16 ENCOUNTER — Emergency Department (HOSPITAL_COMMUNITY): Payer: Medicare Other

## 2015-02-16 DIAGNOSIS — Z01810 Encounter for preprocedural cardiovascular examination: Secondary | ICD-10-CM | POA: Diagnosis not present

## 2015-02-16 DIAGNOSIS — Z86718 Personal history of other venous thrombosis and embolism: Secondary | ICD-10-CM | POA: Diagnosis not present

## 2015-02-16 DIAGNOSIS — I252 Old myocardial infarction: Secondary | ICD-10-CM

## 2015-02-16 DIAGNOSIS — Z23 Encounter for immunization: Secondary | ICD-10-CM

## 2015-02-16 DIAGNOSIS — L8990 Pressure ulcer of unspecified site, unspecified stage: Secondary | ICD-10-CM | POA: Diagnosis present

## 2015-02-16 DIAGNOSIS — D684 Acquired coagulation factor deficiency: Secondary | ICD-10-CM | POA: Diagnosis present

## 2015-02-16 DIAGNOSIS — E43 Unspecified severe protein-calorie malnutrition: Secondary | ICD-10-CM | POA: Diagnosis present

## 2015-02-16 DIAGNOSIS — R6520 Severe sepsis without septic shock: Secondary | ICD-10-CM | POA: Diagnosis not present

## 2015-02-16 DIAGNOSIS — R011 Cardiac murmur, unspecified: Secondary | ICD-10-CM | POA: Diagnosis present

## 2015-02-16 DIAGNOSIS — R140 Abdominal distension (gaseous): Secondary | ICD-10-CM | POA: Diagnosis not present

## 2015-02-16 DIAGNOSIS — R0989 Other specified symptoms and signs involving the circulatory and respiratory systems: Secondary | ICD-10-CM | POA: Diagnosis not present

## 2015-02-16 DIAGNOSIS — R1012 Left upper quadrant pain: Secondary | ICD-10-CM

## 2015-02-16 DIAGNOSIS — F329 Major depressive disorder, single episode, unspecified: Secondary | ICD-10-CM | POA: Diagnosis present

## 2015-02-16 DIAGNOSIS — K567 Ileus, unspecified: Secondary | ICD-10-CM | POA: Diagnosis not present

## 2015-02-16 DIAGNOSIS — D6959 Other secondary thrombocytopenia: Secondary | ICD-10-CM | POA: Diagnosis present

## 2015-02-16 DIAGNOSIS — R109 Unspecified abdominal pain: Secondary | ICD-10-CM | POA: Diagnosis not present

## 2015-02-16 DIAGNOSIS — R410 Disorientation, unspecified: Secondary | ICD-10-CM | POA: Diagnosis not present

## 2015-02-16 DIAGNOSIS — J449 Chronic obstructive pulmonary disease, unspecified: Secondary | ICD-10-CM | POA: Diagnosis present

## 2015-02-16 DIAGNOSIS — K219 Gastro-esophageal reflux disease without esophagitis: Secondary | ICD-10-CM | POA: Diagnosis present

## 2015-02-16 DIAGNOSIS — K2210 Ulcer of esophagus without bleeding: Secondary | ICD-10-CM | POA: Diagnosis present

## 2015-02-16 DIAGNOSIS — I714 Abdominal aortic aneurysm, without rupture: Secondary | ICD-10-CM | POA: Diagnosis present

## 2015-02-16 DIAGNOSIS — I517 Cardiomegaly: Secondary | ICD-10-CM | POA: Diagnosis present

## 2015-02-16 DIAGNOSIS — Z885 Allergy status to narcotic agent status: Secondary | ICD-10-CM | POA: Diagnosis not present

## 2015-02-16 DIAGNOSIS — E785 Hyperlipidemia, unspecified: Secondary | ICD-10-CM | POA: Diagnosis present

## 2015-02-16 DIAGNOSIS — T451X5A Adverse effect of antineoplastic and immunosuppressive drugs, initial encounter: Secondary | ICD-10-CM | POA: Diagnosis present

## 2015-02-16 DIAGNOSIS — K5900 Constipation, unspecified: Secondary | ICD-10-CM | POA: Diagnosis present

## 2015-02-16 DIAGNOSIS — G9340 Encephalopathy, unspecified: Secondary | ICD-10-CM | POA: Diagnosis not present

## 2015-02-16 DIAGNOSIS — R1900 Intra-abdominal and pelvic swelling, mass and lump, unspecified site: Secondary | ICD-10-CM | POA: Diagnosis present

## 2015-02-16 DIAGNOSIS — Z801 Family history of malignant neoplasm of trachea, bronchus and lung: Secondary | ICD-10-CM

## 2015-02-16 DIAGNOSIS — C92Z0 Other myeloid leukemia not having achieved remission: Secondary | ICD-10-CM | POA: Diagnosis not present

## 2015-02-16 DIAGNOSIS — R1112 Projectile vomiting: Secondary | ICD-10-CM | POA: Diagnosis present

## 2015-02-16 DIAGNOSIS — B370 Candidal stomatitis: Secondary | ICD-10-CM | POA: Diagnosis present

## 2015-02-16 DIAGNOSIS — Z7401 Bed confinement status: Secondary | ICD-10-CM | POA: Diagnosis not present

## 2015-02-16 DIAGNOSIS — I959 Hypotension, unspecified: Secondary | ICD-10-CM | POA: Diagnosis not present

## 2015-02-16 DIAGNOSIS — D696 Thrombocytopenia, unspecified: Secondary | ICD-10-CM | POA: Diagnosis not present

## 2015-02-16 DIAGNOSIS — R110 Nausea: Secondary | ICD-10-CM | POA: Diagnosis not present

## 2015-02-16 DIAGNOSIS — J811 Chronic pulmonary edema: Secondary | ICD-10-CM | POA: Diagnosis present

## 2015-02-16 DIAGNOSIS — C9200 Acute myeloblastic leukemia, not having achieved remission: Secondary | ICD-10-CM | POA: Diagnosis present

## 2015-02-16 DIAGNOSIS — R1310 Dysphagia, unspecified: Secondary | ICD-10-CM | POA: Diagnosis present

## 2015-02-16 DIAGNOSIS — Z79899 Other long term (current) drug therapy: Secondary | ICD-10-CM

## 2015-02-16 DIAGNOSIS — R0600 Dyspnea, unspecified: Secondary | ICD-10-CM

## 2015-02-16 DIAGNOSIS — D649 Anemia, unspecified: Secondary | ICD-10-CM

## 2015-02-16 DIAGNOSIS — E7800 Pure hypercholesterolemia, unspecified: Secondary | ICD-10-CM | POA: Diagnosis present

## 2015-02-16 DIAGNOSIS — Z515 Encounter for palliative care: Secondary | ICD-10-CM | POA: Diagnosis present

## 2015-02-16 DIAGNOSIS — D46Z Other myelodysplastic syndromes: Secondary | ICD-10-CM | POA: Diagnosis present

## 2015-02-16 DIAGNOSIS — Z888 Allergy status to other drugs, medicaments and biological substances status: Secondary | ICD-10-CM

## 2015-02-16 DIAGNOSIS — Z955 Presence of coronary angioplasty implant and graft: Secondary | ICD-10-CM

## 2015-02-16 DIAGNOSIS — D4620 Refractory anemia with excess of blasts, unspecified: Secondary | ICD-10-CM | POA: Diagnosis not present

## 2015-02-16 DIAGNOSIS — Z9081 Acquired absence of spleen: Secondary | ICD-10-CM

## 2015-02-16 DIAGNOSIS — I10 Essential (primary) hypertension: Secondary | ICD-10-CM | POA: Diagnosis present

## 2015-02-16 DIAGNOSIS — Z809 Family history of malignant neoplasm, unspecified: Secondary | ICD-10-CM | POA: Diagnosis not present

## 2015-02-16 DIAGNOSIS — Z720 Tobacco use: Secondary | ICD-10-CM

## 2015-02-16 DIAGNOSIS — A419 Sepsis, unspecified organism: Secondary | ICD-10-CM | POA: Diagnosis not present

## 2015-02-16 DIAGNOSIS — E46 Unspecified protein-calorie malnutrition: Secondary | ICD-10-CM | POA: Diagnosis present

## 2015-02-16 DIAGNOSIS — Z9049 Acquired absence of other specified parts of digestive tract: Secondary | ICD-10-CM | POA: Diagnosis not present

## 2015-02-16 DIAGNOSIS — I2510 Atherosclerotic heart disease of native coronary artery without angina pectoris: Secondary | ICD-10-CM | POA: Diagnosis present

## 2015-02-16 DIAGNOSIS — E872 Acidosis: Secondary | ICD-10-CM | POA: Diagnosis present

## 2015-02-16 DIAGNOSIS — R569 Unspecified convulsions: Secondary | ICD-10-CM | POA: Diagnosis present

## 2015-02-16 DIAGNOSIS — E876 Hypokalemia: Secondary | ICD-10-CM | POA: Diagnosis present

## 2015-02-16 DIAGNOSIS — D689 Coagulation defect, unspecified: Secondary | ICD-10-CM

## 2015-02-16 DIAGNOSIS — G8929 Other chronic pain: Secondary | ICD-10-CM | POA: Diagnosis present

## 2015-02-16 DIAGNOSIS — Z95828 Presence of other vascular implants and grafts: Secondary | ICD-10-CM

## 2015-02-16 DIAGNOSIS — D6481 Anemia due to antineoplastic chemotherapy: Secondary | ICD-10-CM | POA: Diagnosis present

## 2015-02-16 DIAGNOSIS — D469 Myelodysplastic syndrome, unspecified: Secondary | ICD-10-CM | POA: Diagnosis not present

## 2015-02-16 DIAGNOSIS — Z8679 Personal history of other diseases of the circulatory system: Secondary | ICD-10-CM | POA: Diagnosis not present

## 2015-02-16 DIAGNOSIS — D61818 Other pancytopenia: Secondary | ICD-10-CM | POA: Diagnosis present

## 2015-02-16 DIAGNOSIS — J9601 Acute respiratory failure with hypoxia: Secondary | ICD-10-CM | POA: Diagnosis not present

## 2015-02-16 DIAGNOSIS — N390 Urinary tract infection, site not specified: Secondary | ICD-10-CM | POA: Diagnosis present

## 2015-02-16 DIAGNOSIS — Z8249 Family history of ischemic heart disease and other diseases of the circulatory system: Secondary | ICD-10-CM

## 2015-02-16 DIAGNOSIS — R112 Nausea with vomiting, unspecified: Secondary | ICD-10-CM | POA: Diagnosis not present

## 2015-02-16 DIAGNOSIS — R52 Pain, unspecified: Secondary | ICD-10-CM | POA: Diagnosis not present

## 2015-02-16 DIAGNOSIS — K30 Functional dyspepsia: Secondary | ICD-10-CM | POA: Diagnosis present

## 2015-02-16 DIAGNOSIS — J9811 Atelectasis: Secondary | ICD-10-CM | POA: Diagnosis not present

## 2015-02-16 DIAGNOSIS — Z66 Do not resuscitate: Secondary | ICD-10-CM | POA: Diagnosis present

## 2015-02-16 DIAGNOSIS — J9690 Respiratory failure, unspecified, unspecified whether with hypoxia or hypercapnia: Secondary | ICD-10-CM

## 2015-02-16 DIAGNOSIS — N179 Acute kidney failure, unspecified: Secondary | ICD-10-CM | POA: Diagnosis not present

## 2015-02-16 DIAGNOSIS — M1990 Unspecified osteoarthritis, unspecified site: Secondary | ICD-10-CM | POA: Diagnosis present

## 2015-02-16 DIAGNOSIS — R161 Splenomegaly, not elsewhere classified: Secondary | ICD-10-CM | POA: Diagnosis present

## 2015-02-16 HISTORY — DX: Gastritis, unspecified, without bleeding: K29.70

## 2015-02-16 HISTORY — DX: Essential (primary) hypertension: I10

## 2015-02-16 HISTORY — DX: Bradycardia, unspecified: R00.1

## 2015-02-16 HISTORY — DX: Pneumothorax, unspecified: J93.9

## 2015-02-16 HISTORY — DX: Sialoadenitis, unspecified: K11.20

## 2015-02-16 HISTORY — DX: Deep phlebothrombosis in pregnancy, unspecified trimester: O22.30

## 2015-02-16 HISTORY — DX: Unspecified protein-calorie malnutrition: E46

## 2015-02-16 LAB — COMPREHENSIVE METABOLIC PANEL
ALT: 9 U/L — AB (ref 17–63)
ANION GAP: 11 (ref 5–15)
AST: 10 U/L — ABNORMAL LOW (ref 15–41)
Albumin: 4 g/dL (ref 3.5–5.0)
Alkaline Phosphatase: 68 U/L (ref 38–126)
BUN: 25 mg/dL — ABNORMAL HIGH (ref 6–20)
CHLORIDE: 107 mmol/L (ref 101–111)
CO2: 24 mmol/L (ref 22–32)
Calcium: 9 mg/dL (ref 8.9–10.3)
Creatinine, Ser: 1.15 mg/dL (ref 0.61–1.24)
GFR calc non Af Amer: >60 mL/min (ref >60)
Glucose, Bld: 116 mg/dL — ABNORMAL HIGH (ref 65–99)
POTASSIUM: 3.8 mmol/L (ref 3.5–5.1)
SODIUM: 142 mmol/L (ref 135–145)
Total Bilirubin: 1 mg/dL (ref 0.3–1.2)
Total Protein: 7.1 g/dL (ref 6.5–8.1)

## 2015-02-16 LAB — CBC WITH DIFFERENTIAL/PLATELET
BASOS ABS: 0 10*3/uL (ref 0.0–0.1)
BASOS PCT: 1 %
Blasts: 16 %
EOS ABS: 0 10*3/uL (ref 0.0–0.7)
EOS PCT: 0 %
HCT: 21.5 % — ABNORMAL LOW (ref 39.0–52.0)
HEMOGLOBIN: 7.1 g/dL — AB (ref 13.0–17.0)
LYMPHS PCT: 18 %
Lymphs Abs: 0.6 10*3/uL — ABNORMAL LOW (ref 0.7–4.0)
MCH: 27 pg (ref 26.0–34.0)
MCHC: 33 g/dL (ref 30.0–36.0)
MCV: 81.7 fL (ref 78.0–100.0)
MONOS PCT: 28 %
Monocytes Absolute: 1 10*3/uL (ref 0.1–1.0)
NEUTROS PCT: 37 %
Neutro Abs: 1.3 10*3/uL — ABNORMAL LOW (ref 1.7–7.7)
PLATELETS: 37 10*3/uL — AB (ref 150–400)
RBC: 2.63 MIL/uL — ABNORMAL LOW (ref 4.22–5.81)
RDW: 17.6 % — ABNORMAL HIGH (ref 11.5–15.5)
WBC: 3.4 10*3/uL — ABNORMAL LOW (ref 4.0–10.5)
nRBC: 3 /100 WBC — ABNORMAL HIGH

## 2015-02-16 LAB — CBC
HCT: 20.3 % — ABNORMAL LOW (ref 39.0–52.0)
Hemoglobin: 6.6 g/dL — CL (ref 13.0–17.0)
MCH: 26.6 pg (ref 26.0–34.0)
MCHC: 32.5 g/dL (ref 30.0–36.0)
MCV: 81.9 fL (ref 78.0–100.0)
PLATELETS: 36 10*3/uL — AB (ref 150–400)
RBC: 2.48 MIL/uL — AB (ref 4.22–5.81)
RDW: 17.5 % — AB (ref 11.5–15.5)
WBC: 3.4 10*3/uL — AB (ref 4.0–10.5)

## 2015-02-16 LAB — PREPARE RBC (CROSSMATCH)

## 2015-02-16 LAB — GLUCOSE, CAPILLARY: GLUCOSE-CAPILLARY: 98 mg/dL (ref 65–99)

## 2015-02-16 LAB — PATHOLOGIST SMEAR REVIEW

## 2015-02-16 MED ORDER — OXYCODONE HCL 5 MG PO TABS
5.0000 mg | ORAL_TABLET | ORAL | Status: DC | PRN
  Administered 2015-02-17: 5 mg via ORAL
  Filled 2015-02-16: qty 1

## 2015-02-16 MED ORDER — PANTOPRAZOLE SODIUM 40 MG PO TBEC
40.0000 mg | DELAYED_RELEASE_TABLET | Freq: Two times a day (BID) | ORAL | Status: DC
  Administered 2015-02-16 – 2015-02-21 (×11): 40 mg via ORAL
  Filled 2015-02-16 (×12): qty 1

## 2015-02-16 MED ORDER — ONDANSETRON HCL 4 MG/2ML IJ SOLN
4.0000 mg | Freq: Four times a day (QID) | INTRAMUSCULAR | Status: DC | PRN
  Administered 2015-02-16 – 2015-02-20 (×5): 4 mg via INTRAVENOUS
  Filled 2015-02-16 (×5): qty 2

## 2015-02-16 MED ORDER — TEMAZEPAM 15 MG PO CAPS
15.0000 mg | ORAL_CAPSULE | Freq: Every evening | ORAL | Status: DC | PRN
  Administered 2015-02-16 – 2015-02-26 (×7): 15 mg via ORAL
  Filled 2015-02-16 (×7): qty 1

## 2015-02-16 MED ORDER — PROSIGHT PO TABS
1.0000 | ORAL_TABLET | Freq: Two times a day (BID) | ORAL | Status: DC
  Administered 2015-02-16 – 2015-02-21 (×10): 1 via ORAL
  Filled 2015-02-16 (×13): qty 1

## 2015-02-16 MED ORDER — SODIUM CHLORIDE 0.9 % IV SOLN
Freq: Once | INTRAVENOUS | Status: AC
  Administered 2015-02-16: 23:00:00 via INTRAVENOUS

## 2015-02-16 MED ORDER — NITROGLYCERIN 0.4 MG SL SUBL: 0.4000 mg | SUBLINGUAL_TABLET | SUBLINGUAL | Status: DC | PRN

## 2015-02-16 MED ORDER — DIPHENHYDRAMINE HCL 25 MG PO CAPS
25.0000 mg | ORAL_CAPSULE | Freq: Four times a day (QID) | ORAL | Status: DC | PRN
  Administered 2015-02-17 – 2015-02-19 (×7): 25 mg via ORAL
  Filled 2015-02-16 (×7): qty 1

## 2015-02-16 MED ORDER — ALUM & MAG HYDROXIDE-SIMETH 200-200-20 MG/5ML PO SUSP
30.0000 mL | Freq: Four times a day (QID) | ORAL | Status: DC | PRN
  Administered 2015-02-16 – 2015-02-20 (×3): 30 mL via ORAL
  Filled 2015-02-16 (×3): qty 30

## 2015-02-16 MED ORDER — HYDROMORPHONE HCL 1 MG/ML IJ SOLN
1.0000 mg | Freq: Once | INTRAMUSCULAR | Status: AC
  Administered 2015-02-16: 1 mg via INTRAVENOUS
  Filled 2015-02-16: qty 1

## 2015-02-16 MED ORDER — IOHEXOL 300 MG/ML  SOLN
100.0000 mL | Freq: Once | INTRAMUSCULAR | Status: AC | PRN
  Administered 2015-02-16: 100 mL via INTRAVENOUS

## 2015-02-16 MED ORDER — SODIUM CHLORIDE 0.9 % IV SOLN
Freq: Once | INTRAVENOUS | Status: AC
  Administered 2015-02-22: 12:00:00 via INTRAVENOUS

## 2015-02-16 MED ORDER — MAGNESIUM HYDROXIDE 400 MG/5ML PO SUSP
30.0000 mL | Freq: Every day | ORAL | Status: DC | PRN
  Administered 2015-02-16 – 2015-02-19 (×2): 30 mL via ORAL
  Filled 2015-02-16 (×2): qty 30

## 2015-02-16 MED ORDER — PRESERVISION AREDS 2 PO CAPS: ORAL_CAPSULE | Freq: Two times a day (BID) | ORAL | Status: DC

## 2015-02-16 MED ORDER — SODIUM CHLORIDE 0.9 % IV SOLN
INTRAVENOUS | Status: AC
  Administered 2015-02-16: 19:00:00 via INTRAVENOUS

## 2015-02-16 MED ORDER — SIMVASTATIN 20 MG PO TABS
20.0000 mg | ORAL_TABLET | Freq: Every day | ORAL | Status: DC
  Administered 2015-02-16 – 2015-02-25 (×10): 20 mg via ORAL
  Filled 2015-02-16 (×2): qty 2
  Filled 2015-02-16: qty 1
  Filled 2015-02-16: qty 2
  Filled 2015-02-16: qty 1
  Filled 2015-02-16 (×2): qty 2
  Filled 2015-02-16: qty 1
  Filled 2015-02-16 (×3): qty 2

## 2015-02-16 MED ORDER — HYDROMORPHONE HCL 1 MG/ML IJ SOLN
1.0000 mg | INTRAMUSCULAR | Status: DC | PRN
Start: 2015-02-16 — End: 2015-02-22
  Administered 2015-02-16 – 2015-02-21 (×8): 1 mg via INTRAVENOUS
  Filled 2015-02-16 (×8): qty 1

## 2015-02-16 MED ORDER — PB-HYOSCY-ATROPINE-SCOPOLAMINE 16.2 MG PO TABS
1.0000 | ORAL_TABLET | Freq: Three times a day (TID) | ORAL | Status: DC | PRN
  Filled 2015-02-16: qty 1

## 2015-02-16 MED ORDER — SUCRALFATE 1 GM/10ML PO SUSP
1.0000 g | Freq: Three times a day (TID) | ORAL | Status: DC
  Administered 2015-02-16 – 2015-02-18 (×2): 1 g via ORAL
  Filled 2015-02-16 (×3): qty 10

## 2015-02-16 MED ORDER — SODIUM CHLORIDE 0.9 % IV BOLUS (SEPSIS)
1000.0000 mL | Freq: Once | INTRAVENOUS | Status: AC
  Administered 2015-02-16: 1000 mL via INTRAVENOUS

## 2015-02-16 MED ORDER — BISACODYL 5 MG PO TBEC: 5.0000 mg | DELAYED_RELEASE_TABLET | Freq: Every day | ORAL | Status: DC | PRN

## 2015-02-16 MED ORDER — HYDROMORPHONE HCL 1 MG/ML IJ SOLN: 1.0000 mg | INTRAMUSCULAR | Status: DC | PRN

## 2015-02-16 MED ORDER — ONDANSETRON HCL 4 MG PO TABS: 4.0000 mg | ORAL_TABLET | Freq: Four times a day (QID) | ORAL | Status: DC | PRN

## 2015-02-16 NOTE — ED Notes (Signed)
Pt with Hx of myelodysplastic syndrome c/o left side abdominal pain. Pt had CT on Monday which showed massive splenomegaly. Pt not taking chemo since August.

## 2015-02-16 NOTE — ED Provider Notes (Signed)
CSN: [id number]     Arrival date & time 02/19/2015  1117 History   First MD Initiated Contact with Patient 02/20/2015 1135     Chief Complaint  Patient presents with  . Abdominal Pain     (Consider location/radiation/quality/duration/timing/severity/associated sxs/prior Treatment) HPI Comments: 70 year old male with past medical history including myelodysplastic syndrome, AAA, hypertension, hyperlipidemia, CAD status post MI who presents with abdominal pain. The patient is currently followed by oncology for MDS and recently had workup for left-sided abdominal pain which showed massive splenomegaly. He has been treating his pain at home with Percocet, one tablet every 4 hours, and his pain has been severe despite taking the medication on a schedule. He discussed with his oncologist and was told to come here if his pain did not improve. He denies any vomiting or diarrhea. No hematuria or bloody stools. He does endorse constipation that he thinks is related to the pain medication. T max 100 at home.  Patient is a 70 y.o. male presenting with abdominal pain. The history is provided by the patient.  Abdominal Pain   Past Medical History  Diagnosis Date  . Coronary atherosclerosis of native coronary artery     a. 10/2008 inf STEMI/PCI: LM 50d (IVUS-borderline lesion->med rx), LAD min irregs, LCX 60m 70d, OM nl, RCA 109m3.5x28 Vision BMS);  b. 11/2008 Lexiscan MV: EF 65%, no isch/scar.  . Essential hypertension, benign   . Pure hypercholesterolemia   . AAA (abdominal aortic aneurysm) ([facility]    a. 05/2013 CT: 5.8 cm AAA.  . Marland Kitcheniverticulitis     a. 05/2013 CT: descending/sigmoid jxn w/o abscess.  . Osteoarthritis   . Tobacco abuse     a. ongoing - 1ppd for better part of 50 yrs.  . Normocytic anemia   . Cellulitis 06/10/2013    Right antecubital fossa at site of IV  03/12/13  . PONV (postoperative nausea and vomiting)   . Diverticulitis   . GERD (gastroesophageal reflux disease)   . MDS  (myelodysplastic syndrome), high grade ([facility]3/30/2016  . Myocardial infarction ([facility]2010  . Depression   . Spontaneous pneumothorax 09/14/2014    LEFT LUNG  . Hypotestosteronemia 10/04/2014  . Esophageal ulcer    Past Surgical History  Procedure Laterality Date  . Cardiac stents  2010  . Cholecystectomy N/A 06/19/2013    Procedure: LAPAROSCOPIC CHOLECYSTECTOMY;  Surgeon: DoHarl BowieMD;  [facility];  Laterality: N/A;  . Abdominal aortic endovascular stent graft N/A 07/02/2013    Procedure: ABDOMINAL AORTIC ENDOVASCULAR STENT GRAFT;  Surgeon: VaSerafina MitchellMD;  [facility];  Service: Vascular;  Laterality: N/A;  . Esophagogastroduodenoscopy N/A 08/05/2013    Procedure: ESOPHAGOGASTRODUODENOSCOPY (EGD);  Surgeon: ViLear NgMD;  [facility];  Service: Endoscopy;  Laterality: N/A;  . Eye surgery Left     cataract surgery  . Back surgery      cervical fusion  . Colonoscopy with propofol N/A 10/15/2013    Procedure: COLONOSCOPY WITH PROPOFOL;  Surgeon: ViLear NgMD;  [place] [facility]: [place];  Laterality: N/A;  . Fetal blood transfusion  March 16,17,18, 2016  . Bone marrow biopsy  April 01, 2014  . Chest tube insertion  09/14/2014  . Video assisted thoracoscopy Left 10/11/2014    Procedure: VIDEO ASSISTED THORACOSCOPY;  Surgeon: PeIvin PootMD;  [facility]: Thor[facility];  Laterality: Left;  . Stapling of blebs Left 10/11/2014    Procedure:  STAPLING OF BLEBS;  Surgeon: Ivin Poot, MD;  [facility];  Service: Thoracic;  Laterality: Left;  . Esophagogastroduodenoscopy N/A 11/04/2014    Procedure: ESOPHAGOGASTRODUODENOSCOPY (EGD);  Surgeon: Laurence Spates, MD;  [facility];  Service: Endoscopy;  Laterality: N/A;   Family History  Problem Relation Age of Onset  . Heart attack Brother     98s  . Cancer Father     Lung  . Cancer Mother     Brain  . Cancer Brother      Kidney   Social History  Substance Use Topics  . Smoking status: Former Smoker -- 1.00 packs/day for 60 years    Types: Cigarettes    Start date: 04/05/1964  . Smokeless tobacco: Never Used     Comment: 09/14/2014  . Alcohol Use: No    Review of Systems  Gastrointestinal: Positive for abdominal pain.   10 Systems reviewed and are negative for acute change except as noted in the HPI.    Allergies  Albuterol-ipratropium-soybean lecithin; Cephalexin; Compazine; Percocet; Phenergan; Tramadol; Vicodin; Marinol; and Reglan  Home Medications   Prior to Admission medications   Medication Sig Start Date End Date Taking? Authorizing Provider  acetaminophen (TYLENOL) 500 MG tablet Take 500 mg by mouth every 6 (six) hours as needed for fever.   Yes Historical Provider, MD  [place] alk-PHENObarbital (DONNATAL) 16.2 MG tablet Take 1 tablet by mouth every 8 (eight) hours as needed. 12/23/14  Yes Volanda Napoleon, MD  diphenhydrAMINE (BENADRYL) 25 mg capsule Take 1 capsule (25 mg total) by mouth every 6 (six) hours as needed for itching. May take with Percocet PRN 10/17/14  Yes Donielle Liston Alba, PA-C  doxycycline (VIBRA-TABS) 100 MG tablet Take 1 tablet (100 mg total) by mouth 2 (two) times daily. For 7 days. 02/02/15  Yes Volanda Napoleon, MD  HYDROcodone-acetaminophen (NORCO/VICODIN) 5-325 MG tablet Take 1-2 tablets by mouth every 4 (four) hours as needed for moderate pain.   Yes Historical Provider, MD  lidocaine-prilocaine (EMLA) cream Apply 1 application topically as needed. Please dispense 2 tubes 10/12/14  Yes Volanda Napoleon, MD  Multiple Vitamins-Minerals (PRESERVISION AREDS 2 PO) Take by mouth 2 (two) times daily.   Yes Historical Provider, MD  nitroGLYCERIN (NITROSTAT) 0.4 MG SL tablet Place 1 tablet (0.4 mg total) under the tongue every 5 (five) minutes as needed for chest pain. 03/29/14  Yes Sherren Mocha, MD  oxyCODONE-acetaminophen (PERCOCET) 7.5-325 MG tablet Take 1-2 tablets by  mouth every 4 (four) hours as needed for moderate pain or severe pain. 02/15/15  Yes Volanda Napoleon, MD  pantoprazole (PROTONIX) 40 MG tablet Take 1 tablet (40 mg total) by mouth 2 (two) times daily. 01/31/15  Yes Sherren Mocha, MD  predniSONE (DELTASONE) 20 MG tablet Take 20 mg by mouth as needed (chemo reaction and energy "boost.").    Yes Historical Provider, MD  simvastatin (ZOCOR) 20 MG tablet Take 1 tablet (20 mg total) by mouth at bedtime. 03/30/14  Yes Sherren Mocha, MD  sucralfate (CARAFATE) 1 GM/10ML suspension Take 10 mLs (1 g total) by mouth 4 (four) times daily -  with meals and at bedtime. Patient taking differently: Take 1 g by mouth 4 (four) times daily -  with meals and at bedtime. Takes as needed 11/08/14  Yes Robbie Lis, MD  temazepam (RESTORIL) 15 MG capsule Take 15 mg by mouth at bedtime as needed for sleep.   Yes Historical Provider, MD  mupirocin ointment (BACTROBAN) 2 %  Apply 3 times a day to affected area under RIGHT ear. Patient not taking: Reported on 02/22/2015 02/02/15   Volanda Napoleon, MD   BP 136/74 mmHg  Pulse 84  Temp(Src) 98.1 F (36.7 C) (Oral)  Resp 20  SpO2 99% Physical Exam  Constitutional: He is oriented to person, place, and time. He appears well-developed and well-nourished. No distress.  uncomfortable  HENT:  Head: Normocephalic.  Moist mucous membranes, small old ecchymosis L eyelid  Eyes: Conjunctivae are normal. Pupils are equal, round, and reactive to light.  Neck: Neck supple.  Cardiovascular: Normal rate, regular rhythm and normal heart sounds.   2/6 systolic murmur  Pulmonary/Chest: Effort normal and breath sounds normal.  Abdominal: Soft. Bowel sounds are normal. He exhibits no distension.  + splenomegaly w/ TTP along LUQ and left mid abdomen  Musculoskeletal: He exhibits no edema.  Neurological: He is alert and oriented to person, place, and time.  Fluent speech  Skin: Skin is warm and dry.  Psychiatric: He has a normal mood and  affect. Judgment normal.  Nursing note and vitals reviewed.   ED Course  Procedures (including critical care time) Labs Review Labs Reviewed  COMPREHENSIVE METABOLIC PANEL - Abnormal; Notable for the following:    Glucose, Bld 116 (*)    BUN 25 (*)    AST 10 (*)    ALT 9 (*)    All other components within normal limits  CBC WITH DIFFERENTIAL/PLATELET - Abnormal; Notable for the following:    WBC 3.4 (*)    RBC 2.63 (*)    Hemoglobin 7.1 (*)    HCT 21.5 (*)    RDW 17.6 (*)    Platelets 37 (*)    nRBC 3 (*)    Neutro Abs 1.3 (*)    Lymphs Abs 0.6 (*)    All other components within normal limits  PATHOLOGIST SMEAR REVIEW    Imaging Review Ct Abdomen Pelvis W Contrast  02/28/2015  CLINICAL DATA:  70 year old male with myelodysplastic syndrome, left side abdominal pain, splenomegaly on ultrasound. Subsequent encounter. EXAM: CT ABDOMEN AND PELVIS WITH CONTRAST TECHNIQUE: Multidetector CT imaging of the abdomen and pelvis was performed using the standard protocol following bolus administration of intravenous contrast. CONTRAST:  111m OMNIPAQUE IOHEXOL 300 MG/ML  SOLN COMPARISON:  Ultrasound 02/13/2015. CT Abdomen and Pelvis 11/17/2014 and earlier. FINDINGS: Stable lung bases.  No pericardial or pleural effusion. Chronic left ninth rib fracture appears stable. No acute osseous abnormality identified. Trace pelvic free fluid best seen on coronal image 66, nonspecific. Unremarkable urinary bladder. Negative rectum. Severe diverticulosis of the sigmoid colon, but no active inflammation. Diverticulosis continues into the descending colon which is medially displaced due to the severe splenomegaly. No left colon inflammation. Negative transverse colon, right colon, and appendix. Negative terminal ileum. No dilated small bowel. Decompressed stomach. Negative duodenum ; chronic second portion duodenum diverticulum. Severe splenomegaly, significantly progressed since November. Estimated splenic volume  2092 mL (normal splenic volume range 83 - 412 mL). This is compared to an estimated splenic volume of 1100 mL in November. There is subtle abnormal hypo enhancement along the posterior superior pole of the spleen, best seen on series 2, image 14. There is trace perisplenic fluid/stranding along the inferior pole. No abdominal free air. No other abdominal free fluid identified. Surgically absent gallbladder. Liver, pancreas, adrenal glands (stable chronic left adrenal nodule), and kidneys are stable. Portal venous system remains patent. Sequelae of infrarenal abdominal aortic aneurysm repair with bifurcated endograft. IVC filter  in place. The IVC and major pelvic veins appear patent. No lymphadenopathy. IMPRESSION: 1. Severe splenomegaly, nearly doubled in size since November. No evidence of acute splenic rupture, but there is abnormal hypoenhancement along the superior pole of the spleen and trace perisplenic inflammation or fluid which is nonspecific. Developing splenic infarct is a consideration. 2. Trace pelvic free fluid, favor reactive to #1. 3. Otherwise stable abdomen and pelvis. Electronically Signed   By: Genevie Ann M.D.   On: 03/12/2015 14:54   I have personally reviewed and evaluated these images and lab results as part of my medical decision-making.   EKG Interpretation None     Medications  HYDROmorphone (DILAUDID) injection 1 mg (not administered)     MDM   Final diagnoses:  Splenomegaly  Anemia, unspecified anemia type    Pt w/ known MDS and recent diagnosis of splenomegaly presents with worsening left-sided abdominal pain despite taking scheduled Percocet. On exam he was nontoxic with reassuring vital signs. Significant splenomegaly noted with tenderness to palpation along his left abdomen. Obtained above lab work and gave the patient Dilaudid for pain. Labs show mild leukopenia, hemoglobin 7.1, platelets 37,000. Obtain CT scan to rule out splenic rupture. CT shows severe splenomegaly  without evidence of rupture but some hypo-enhancement possibly suggesting splenic infarct. I discussed with the patient's primary oncologist, Dr.Ennever, who recommended admission based on patient's intractable pain and worsening condition. The patient will require further workup to determine etiology of his splenomegaly. I discussed admission with [facility] hospitalist, Dr. Daryll Drown, and pt admitted to medicine for further work up and pain control.  Sharlett Iles, MD 03/11/2015 1726

## 2015-02-16 NOTE — Progress Notes (Signed)
Critical lab result Hgb 6.6 Notified hospitalist on call

## 2015-02-16 NOTE — ED Notes (Signed)
RN to start IV on pt

## 2015-02-16 NOTE — H&P (Signed)
[facility] History and Physical  NAVARRO NINE DPO:[id number] DOB: [birth year] DOA: 03/14/2015  Referring physician: ED physician PCP: Shirline Frees, MD   Chief Complaint: abdominal pain, splenomegaly  HPI:  Mr. Lovings is a 70yo man with PMH of MDS, anemia, CAD, HTN, AAA with repair, GERD, Depression who presents with left flank and upper quadrant pain.  Mr. Yo has a history of MDS and a few weeks ago started developing left sided pain. He presented to his oncologist who follows his MDS with the pain and ultrasound was done showing splenomegaly.  Dr. Marin Olp at that time was concerned for transformation of his MDS into leukemia, but there was no definitive signs so BM biopsy was planned.  Mr. Tyler, reports, that he was maintained on percocet and vicoden but neither of these adequately controlled his pain so he came in.  He has severe pain with taking a deep breath, moving and sitting up. This is mainly in his left upper quadrant.  He also has abdominal swelling and hardening to the left upper quadrant.  He has had decreased PO intake.  He also notes constipation with no BM X 2 days which he attributes to the pain medication.    Blood work checked today showed progressive anemia which is not uncommon for him given his MDS.  Per report, ED spoke with Dr. Marin Olp who recommended admission for pain control and possible earlier BM biopsy which had been planned for 2/7.    Assessment and Plan: Splenomegaly and Abdominal pain - Oncology contacted  - Attempt to move up BM biopsy - Pain control with IV dilaudid and oxycodone  - Nausea control with Zofran - For constipation, patient does not want bowel regimen for fear of having diarrhea, have placed orders PRN if he should need - IVF with NS at 75cc/hr overnight - Diphenhydramine for itching - He takes donnatal at home for stomach upset, this was continued - NPO at midnight - PRN temazepam for sleep at home dose    MDS (myelodysplastic  syndrome), high grade with pancytopenia - H/H low to 7.1 on check today, repeat pending - Transfuse for < 7.0 PRBC - Plts around 30, will hold off on tranfusion - No active bleeding, monitor for such    Coronary atherosclerosis of native coronary artery - Only home med appears to be PRN nitro, this was continued  GERD - Continue protonix, sucralfate, donnatal.      DVT PPx: [employer] Held given thrombocytopenia, SCDs  Diet: Soft diet, NPO at MN for possible BM biopsy  Radiological Exams on Admission: Ct Abdomen Pelvis W Contrast  03/07/2015  CLINICAL DATA:  70 year old male with myelodysplastic syndrome, left side abdominal pain, splenomegaly on ultrasound. Subsequent encounter. EXAM: CT ABDOMEN AND PELVIS WITH CONTRAST TECHNIQUE: Multidetector CT imaging of the abdomen and pelvis was performed using the standard protocol following bolus administration of intravenous contrast. CONTRAST:  144m OMNIPAQUE IOHEXOL 300 MG/ML  SOLN COMPARISON:  Ultrasound 02/13/2015. CT Abdomen and Pelvis 11/17/2014 and earlier. FINDINGS: Stable lung bases.  No pericardial or pleural effusion. Chronic left ninth rib fracture appears stable. No acute osseous abnormality identified. Trace pelvic free fluid best seen on coronal image 66, nonspecific. Unremarkable urinary bladder. Negative rectum. Severe diverticulosis of the sigmoid colon, but no active inflammation. Diverticulosis continues into the descending colon which is medially displaced due to the severe splenomegaly. No left colon inflammation. Negative transverse colon, right colon, and appendix. Negative terminal ileum. No dilated small bowel. Decompressed stomach. Negative duodenum ;  chronic second portion duodenum diverticulum. Severe splenomegaly, significantly progressed since November. Estimated splenic volume 2092 mL (normal splenic volume range 83 - 412 mL). This is compared to an estimated splenic volume of 1100 mL in November. There is subtle abnormal hypo  enhancement along the posterior superior pole of the spleen, best seen on series 2, image 14. There is trace perisplenic fluid/stranding along the inferior pole. No abdominal free air. No other abdominal free fluid identified. Surgically absent gallbladder. Liver, pancreas, adrenal glands (stable chronic left adrenal nodule), and kidneys are stable. Portal venous system remains patent. Sequelae of infrarenal abdominal aortic aneurysm repair with bifurcated endograft. IVC filter in place. The IVC and major pelvic veins appear patent. No lymphadenopathy. IMPRESSION: 1. Severe splenomegaly, nearly doubled in size since November. No evidence of acute splenic rupture, but there is abnormal hypoenhancement along the superior pole of the spleen and trace perisplenic inflammation or fluid which is nonspecific. Developing splenic infarct is a consideration. 2. Trace pelvic free fluid, favor reactive to #1. 3. Otherwise stable abdomen and pelvis. Electronically Signed   By: Genevie Ann M.D.   On: 02/22/2015 14:54     Code Status: Full Family Communication: Pt and family at bedside Disposition Plan: Admit for further evaluation    Gilles Chiquito, MD [phone]   Review of Systems:  Constitutional: + for chills Negative for fever, malaise/fatigue. Negative for diaphoresis.  HENT: Negative for hearing loss, ear pain, nosebleeds Eyes: Negative for blurred vision, double vision, photophobia, pain Respiratory: Negative for cough, hemoptysis, sputum production, shortness of breath,  Cardiovascular:  Negative for chest pain, palpitations Gastrointestinal: + for severe abdominal pain Negative for nausea, vomiting.  Negative for heartburn, constipation, blood in stool and melena.  Genitourinary: Negative for dysuria, urgency, frequency, hematuria Musculoskeletal: Negative for myalgias, back pain, joint pain and falls.  Skin: Negative for itching and rash.  Neurological: + for some lightheadedness Negative for dizziness  and weakness.       Past Medical History  Diagnosis Date  . Coronary atherosclerosis of native coronary artery     a. 10/2008 inf STEMI/PCI: LM 50d (IVUS-borderline lesion->med rx), LAD min irregs, LCX 52m 70d, OM nl, RCA 1012m3.5x28 Vision BMS);  b. 11/2008 Lexiscan MV: EF 65%, no isch/scar.  . Essential hypertension, benign   . Pure hypercholesterolemia   . AAA (abdominal aortic aneurysm) ([facility]    a. 05/2013 CT: 5.8 cm AAA.  . Marland Kitcheniverticulitis     a. 05/2013 CT: descending/sigmoid jxn w/o abscess.  . Osteoarthritis   . Tobacco abuse     a. ongoing - 1ppd for better part of 50 yrs.  . Normocytic anemia   . Cellulitis 06/10/2013    Right antecubital fossa at site of IV  03/12/13  . PONV (postoperative nausea and vomiting)   . Diverticulitis   . GERD (gastroesophageal reflux disease)   . MDS (myelodysplastic syndrome), high grade (HCSunset3/30/2016  . Myocardial infarction (HCHayes2010  . Depression   . Spontaneous pneumothorax 09/14/2014    LEFT LUNG  . Hypotestosteronemia 10/04/2014  . Esophageal ulcer     Past Surgical History  Procedure Laterality Date  . Cardiac stents  2010  . Cholecystectomy N/A 06/19/2013    Procedure: LAPAROSCOPIC CHOLECYSTECTOMY;  Surgeon: DoHarl BowieMD;  [facility] Service: General;  Laterality: N/A;  . Abdominal aortic endovascular stent graft N/A 07/02/2013    Procedure: ABDOMINAL AORTIC ENDOVASCULAR STENT GRAFT;  Surgeon: VaSerafina MitchellMD;  [place] [facility]  Service: Vascular;  Laterality: N/A;  . Esophagogastroduodenoscopy N/A 08/05/2013    Procedure: ESOPHAGOGASTRODUODENOSCOPY (EGD);  Surgeon: Lear Ng, MD;  Location[facility] ENDOSCOPY;  Service: Endoscopy;  Laterality: N/A;  . Eye surgery Left     cataract surgery  . Back surgery      cervical fusion  . Colonoscopy with propofol N/A 10/15/2013    Procedure: COLONOSCOPY WITH PROPOFOL;  Surgeon: Lear Ng, MD;  Location: [place];  Service: Endoscopy;  Laterality:  N/A;  . Fetal blood transfusion  March 16,17,18, 2016  . Bone marrow biopsy  April 01, 2014  . Chest tube insertion  09/14/2014  . Video assisted thoracoscopy Left 10/11/2014    Procedure: VIDEO ASSISTED THORACOSCOPY;  Surgeon: Ivin Poot, MD;  Location: [place];  Service: Thoracic;  Laterality: Left;  . Stapling of blebs Left 10/11/2014    Procedure: STAPLING OF BLEBS;  Surgeon: Ivin Poot, MD;  Location: [facility] OR;  Service: Thoracic;  Laterality: Left;  . Esophagogastroduodenoscopy N/A 11/04/2014    Procedure: ESOPHAGOGASTRODUODENOSCOPY (EGD);  Surgeon: Laurence Spates, MD;  Location: [facility] ENDOSCOPY;  Service: Endoscopy;  Laterality: N/A;    Social History:  reports that he has quit smoking. His smoking use included Cigarettes. He started smoking about 50 years ago. He has a 60 pack-year smoking history. He has never used smokeless tobacco. He reports that he does not drink alcohol or use illicit drugs.  Allergies  Allergen Reactions  . Albuterol-Ipratropium-Soybean Lecithin [Ipratropium-Albuterol] Anxiety  . Cephalexin Other (See Comments)    Nausea and stomach cramps  . Compazine [Prochlorperazine] Anxiety  . Percocet [Oxycodone-Acetaminophen] Itching    Can take with benadryl    . Phenergan [Promethazine Hcl] Other (See Comments)    "restless legs"  . Tramadol Other (See Comments)    "jerking limbs and talking in his sleep"  . Vicodin [Hydrocodone-Acetaminophen] Itching    Can take with benadryl   . Marinol [Dronabinol] Other (See Comments)    "Affects his eyes"  . Reglan [Metoclopramide] Other (See Comments)    Patient has significant shakes that are troubling. Would not like to receive this if there are other options available.    Family History  Problem Relation Age of Onset  . Heart attack Brother     55s  . Cancer Father     Lung  . Cancer Mother     Brain  . Cancer Brother     Kidney    Prior to Admission medications   Medication Sig Start Date End Date  Taking? Authorizing Provider  acetaminophen (TYLENOL) 500 MG tablet Take 500 mg by mouth every 6 (six) hours as needed for fever.   Yes Historical Provider, MD  [place] alk-PHENObarbital (DONNATAL) 16.2 MG tablet Take 1 tablet by mouth every 8 (eight) hours as needed. 12/23/14  Yes Volanda Napoleon, MD  diphenhydrAMINE (BENADRYL) 25 mg capsule Take 1 capsule (25 mg total) by mouth every 6 (six) hours as needed for itching. May take with Percocet PRN 10/17/14  Yes Donielle Liston Alba, PA-C         HYDROcodone-acetaminophen (NORCO/VICODIN) 5-325 MG tablet Take 1-2 tablets by mouth every 4 (four) hours as needed for moderate pain.   Yes Historical Provider, MD  lidocaine-prilocaine (EMLA) cream Apply 1 application topically as needed. Please dispense 2 tubes 10/12/14  Yes Volanda Napoleon, MD  Multiple Vitamins-Minerals (PRESERVISION AREDS 2 PO) Take by mouth 2 (two) times daily.   Yes Historical Provider, MD  nitroGLYCERIN (NITROSTAT)  0.4 MG SL tablet Place 1 tablet (0.4 mg total) under the tongue every 5 (five) minutes as needed for chest pain. 03/29/14  Yes Sherren Mocha, MD  oxyCODONE-acetaminophen (PERCOCET) 7.5-325 MG tablet Take 1-2 tablets by mouth every 4 (four) hours as needed for moderate pain or severe pain. 02/15/15  Yes Volanda Napoleon, MD  pantoprazole (PROTONIX) 40 MG tablet Take 1 tablet (40 mg total) by mouth 2 (two) times daily. 01/31/15  Yes Sherren Mocha, MD         simvastatin (ZOCOR) 20 MG tablet Take 1 tablet (20 mg total) by mouth at bedtime. 03/30/14  Yes Sherren Mocha, MD  sucralfate (CARAFATE) 1 GM/10ML suspension Take 10 mLs (1 g total) by mouth 4 (four) times daily -  with meals and at bedtime. Patient taking differently: Take 1 g by mouth 4 (four) times daily -  with meals and at bedtime. Takes as needed 11/08/14  Yes Robbie Lis, MD  temazepam (RESTORIL) 15 MG capsule Take 15 mg by mouth at bedtime as needed for sleep.   Yes Historical Provider, MD  mupirocin ointment  (BACTROBAN) 2 % Apply 3 times a day to affected area under RIGHT ear. Patient not taking: Reported on 02/19/2015 02/02/15   Volanda Napoleon, MD    Physical Exam: Filed Vitals:   03/07/2015 1219 03/02/2015 1404 02/20/2015 1539 02/27/2015 1845  BP: 138/73 116/77 118/66 113/64  Pulse: 86 81 80 81  Temp:    98.5 F (36.9 C)  TempSrc:    Oral  Resp: '18 18 16 16  '$ Height:    '5\' 10"'$  (1.778 m)  Weight:    163 lb 2.3 oz (74 kg)  SpO2: 98% 96% 95% 99%    Physical Exam  Constitutional: Thin, in distress from pain, alert and oriented HENT: Normocephalic.  Oropharynx is clear and moist.  Eyes: Conjunctivae are pale. PERRLA, no scleral icterus.  Neck: Normal ROM. Neck supple.  CVS: Tachycardia, RR, S1/S2 +, no murmurs Pulmonary: Effort and breath sounds normal, no wheezes, rales.  Abdominal: Voluntary and involuntary guarding makes distention difficult to tell, palpable and very tender spleen present in LUQ likely down to umbilicus but thorough exam not able to be done.  + bowel sounds, though they are sluggish.  Musculoskeletal: Trace edema to ankles, no pain.  Cap refill > 2 seconds, very pale capillary beds in fingers Neuro: Alert. Normal muscle tone.  Skin: Skin is warm and dry. No rash noted.  Psychiatric: in pain  Labs on Admission:  Basic Metabolic Panel:  Recent Labs Lab 02/13/15 0915 02/23/2015 1221  NA 141 142  K 3.8 3.8  CL  --  107  CO2 20* 24  GLUCOSE 94 116*  BUN 23.0 25*  CREATININE 1.2 1.15  CALCIUM 8.8 9.0   Liver Function Tests:  Recent Labs Lab 02/13/15 0915 02/20/2015 1221  AST 8 10*  ALT 10 9*  ALKPHOS 67 68  BILITOT 0.57 1.0  PROT 7.1 7.1  ALBUMIN 3.8 4.0    CBC:  Recent Labs Lab 02/13/15 0914 02/21/2015 1221 03/07/2015 1945  WBC 3.1* 3.4* 3.4*  NEUTROABS  --  1.3*  --   HGB 7.4* 7.1* 6.6*  HCT 23.2* 21.5* 20.3*  MCV 85 81.7 81.9  PLT 23* 37* 36*      If 7PM-7AM, please contact night-coverage [email or web address] Password [email or web address] 03/04/2015, 8:16  PM

## 2015-02-17 ENCOUNTER — Inpatient Hospital Stay (HOSPITAL_COMMUNITY): Payer: Medicare Other

## 2015-02-17 ENCOUNTER — Encounter (HOSPITAL_COMMUNITY): Payer: Self-pay | Admitting: Radiology

## 2015-02-17 DIAGNOSIS — D4620 Refractory anemia with excess of blasts, unspecified: Secondary | ICD-10-CM

## 2015-02-17 DIAGNOSIS — C9200 Acute myeloblastic leukemia, not having achieved remission: Secondary | ICD-10-CM | POA: Diagnosis not present

## 2015-02-17 DIAGNOSIS — R161 Splenomegaly, not elsewhere classified: Secondary | ICD-10-CM

## 2015-02-17 LAB — CBC
HEMATOCRIT: 26.2 % — AB (ref 39.0–52.0)
HEMOGLOBIN: 8.6 g/dL — AB (ref 13.0–17.0)
MCH: 27.8 pg (ref 26.0–34.0)
MCHC: 32.8 g/dL (ref 30.0–36.0)
MCV: 84.8 fL (ref 78.0–100.0)
Platelets: 38 10*3/uL — ABNORMAL LOW (ref 150–400)
RBC: 3.09 MIL/uL — AB (ref 4.22–5.81)
RDW: 16.5 % — ABNORMAL HIGH (ref 11.5–15.5)
WBC: 3.8 10*3/uL — AB (ref 4.0–10.5)

## 2015-02-17 LAB — COMPREHENSIVE METABOLIC PANEL
ALK PHOS: 68 U/L (ref 38–126)
ALT: 9 U/L — AB (ref 17–63)
ANION GAP: 9 (ref 5–15)
AST: 10 U/L — ABNORMAL LOW (ref 15–41)
Albumin: 3.8 g/dL (ref 3.5–5.0)
BILIRUBIN TOTAL: 1.3 mg/dL — AB (ref 0.3–1.2)
BUN: 22 mg/dL — ABNORMAL HIGH (ref 6–20)
CALCIUM: 8.8 mg/dL — AB (ref 8.9–10.3)
CO2: 25 mmol/L (ref 22–32)
Chloride: 106 mmol/L (ref 101–111)
Creatinine, Ser: 1.04 mg/dL (ref 0.61–1.24)
Glucose, Bld: 116 mg/dL — ABNORMAL HIGH (ref 65–99)
Potassium: 4 mmol/L (ref 3.5–5.1)
SODIUM: 140 mmol/L (ref 135–145)
TOTAL PROTEIN: 7.1 g/dL (ref 6.5–8.1)

## 2015-02-17 LAB — PROTIME-INR
INR: 1.35 (ref 0.00–1.49)
PROTHROMBIN TIME: 16.8 s — AB (ref 11.6–15.2)

## 2015-02-17 LAB — GLUCOSE, CAPILLARY
GLUCOSE-CAPILLARY: 102 mg/dL — AB (ref 65–99)
GLUCOSE-CAPILLARY: 109 mg/dL — AB (ref 65–99)
Glucose-Capillary: 109 mg/dL — ABNORMAL HIGH (ref 65–99)

## 2015-02-17 LAB — BONE MARROW EXAM

## 2015-02-17 MED ORDER — FENTANYL CITRATE (PF) 100 MCG/2ML IJ SOLN
INTRAMUSCULAR | Status: AC
  Filled 2015-02-17: qty 4

## 2015-02-17 MED ORDER — MIDAZOLAM HCL 2 MG/2ML IJ SOLN
INTRAMUSCULAR | Status: AC | PRN
  Administered 2015-02-17: 1 mg via INTRAVENOUS

## 2015-02-17 MED ORDER — SENNOSIDES-DOCUSATE SODIUM 8.6-50 MG PO TABS
2.0000 | ORAL_TABLET | Freq: Two times a day (BID) | ORAL | Status: DC
  Administered 2015-02-17 – 2015-02-21 (×7): 2 via ORAL
  Filled 2015-02-17 (×7): qty 2

## 2015-02-17 MED ORDER — POLYETHYLENE GLYCOL 3350 17 G PO PACK
17.0000 g | PACK | Freq: Two times a day (BID) | ORAL | Status: DC
  Administered 2015-02-18 – 2015-02-21 (×6): 17 g via ORAL
  Filled 2015-02-17 (×6): qty 1

## 2015-02-17 MED ORDER — POLYETHYLENE GLYCOL 3350 17 G PO PACK
34.0000 g | PACK | Freq: Once | ORAL | Status: AC
  Administered 2015-02-17: 34 g via ORAL
  Filled 2015-02-17: qty 2

## 2015-02-17 MED ORDER — PNEUMOCOCCAL VAC POLYVALENT 25 MCG/0.5ML IJ INJ
0.5000 mL | INJECTION | Freq: Once | INTRAMUSCULAR | Status: AC
  Administered 2015-02-17: 0.5 mL via INTRAMUSCULAR
  Filled 2015-02-17: qty 0.5

## 2015-02-17 MED ORDER — MENINGOCOCCAL VAC A,C,Y,W-135 ~~LOC~~ INJ
0.5000 mL | INJECTION | Freq: Once | SUBCUTANEOUS | Status: AC
  Administered 2015-02-17: 0.5 mL via SUBCUTANEOUS
  Filled 2015-02-17: qty 0.5

## 2015-02-17 MED ORDER — FENTANYL CITRATE (PF) 100 MCG/2ML IJ SOLN
INTRAMUSCULAR | Status: AC | PRN
  Administered 2015-02-17: 50 ug via INTRAVENOUS

## 2015-02-17 MED ORDER — HYDROMORPHONE HCL 2 MG/ML IJ SOLN
2.0000 mg | INTRAMUSCULAR | Status: DC | PRN
  Administered 2015-02-17 – 2015-02-21 (×23): 2 mg via INTRAVENOUS
  Filled 2015-02-17 (×24): qty 1

## 2015-02-17 MED ORDER — POLYETHYLENE GLYCOL 3350 17 G PO PACK: 34.0000 g | PACK | Freq: Once | ORAL | Status: DC

## 2015-02-17 MED ORDER — FENTANYL 25 MCG/HR TD PT72
25.0000 ug | MEDICATED_PATCH | TRANSDERMAL | Status: DC
Start: 2015-02-17 — End: 2015-02-22
  Administered 2015-02-17 – 2015-02-22 (×3): 25 ug via TRANSDERMAL
  Filled 2015-02-17 (×3): qty 1

## 2015-02-17 MED ORDER — MIDAZOLAM HCL 2 MG/2ML IJ SOLN
INTRAMUSCULAR | Status: AC
  Filled 2015-02-17: qty 6

## 2015-02-17 MED ORDER — HAEMOPHILUS B POLYSAC CONJ VAC IM SOLR
0.5000 mL | Freq: Once | INTRAMUSCULAR | Status: AC
  Administered 2015-02-17: 0.5 mL via INTRAMUSCULAR
  Filled 2015-02-17: qty 0.5

## 2015-02-17 MED ORDER — BISACODYL 5 MG PO TBEC
10.0000 mg | DELAYED_RELEASE_TABLET | Freq: Once | ORAL | Status: AC
  Administered 2015-02-17: 10 mg via ORAL
  Filled 2015-02-17: qty 2

## 2015-02-17 NOTE — Care Management Note (Signed)
Case Management Note  Patient Details  Name: RAUNAK PIERPOINT MRN: [id number] Date of Birth: [birth year]  Subjective/Objective:      70 yo admitted with Splenomegaly              Action/Plan: From home with spouse.  Expected Discharge Date:                  Expected Discharge Plan:  Home/Self Care  In-House Referral:     Discharge planning Services  CM Consult  Post Acute Care Choice:    Choice offered to:     DME Arranged:    DME Agency:     HH Arranged:    HH Agency:     Status of Service:  In process, will continue to follow  Medicare Important Message Given:    Date Medicare IM Given:    Medicare IM give by:    Date Additional Medicare IM Given:    Additional Medicare Important Message give by:     If discussed at [place] of Stay Meetings, dates discussed:    Additional Comments: Chart reviewed and no CM needs identified or communicated at this time. CM will continue to follow. Marney Doctor RN,BSN,NCM [id number] Lynnell Catalan, RN 02/17/2015, 1:35 PM

## 2015-02-17 NOTE — Consult Note (Signed)
Referral MD  Reason for Referral: Massive splenomegaly. History of myelodysplasia   Chief Complaint  Patient presents with  . Abdominal Pain  : I have a lot of pain in my spleen.  HPI: Mr. Gammel is well-known to me. He is a 70 year old white male. He has myelodysplasia. He has would not responded to therapy. He has declined any further chemotherapy which is very reasonable. We have just given him transfusion support.  Over the past several weeks, he's had more and more pain in the left side. I saw him in the office last week. His spleen was enlarged.  Going back to the x-ray records, his spleen was normal in March. It seemed as if in October, it began to enlarge. It is enlarging quickly.  Exam or more pain. He's been on some Percocet home which is not helping. He was admitted. He did not have a splenic infarct. There is no adenopathy. There is no hepatomegaly.  He has no history of cirrhosis or liver problems.  He did have surgery last year for spontaneous pneumothorax.  He's had no fever.  He's had no obvious bleeding. Has not been any diarrhea.  He was set up for a bone marrow biopsy next Tuesday.  I feel that his spleen is going to have to come out. Again it is enlarging quickly. I'm not sure as to the etiology of this. His quality of life is very poor because of the discomfort.  Currently, his performance status is ECOG 1.                  Past Medical History  Diagnosis Date  . Coronary atherosclerosis of native coronary artery     a. 10/2008 inf STEMI/PCI: LM 50d (IVUS-borderline lesion->med rx), LAD min irregs, LCX 21m 70d, OM nl, RCA [id number] Vision BMS);  b. 11/2008 Lexiscan MV: EF 65%, no isch/scar.  . Essential hypertension, benign   . Pure hypercholesterolemia   . AAA (abdominal aortic aneurysm) (HCD[place]    a. 05/2013 CT: 5.8 cm AAA.  . Marland Kitcheniverticulitis     a. 05/2013 CT: descending/sigmoid jxn w/o abscess.  . Osteoarthritis   . Tobacco abuse    a. ongoing - 1ppd for better part of 50 yrs.  . Normocytic anemia   . Cellulitis 06/10/2013    Right antecubital fossa at site of IV  03/12/13  . PONV (postoperative nausea and vomiting)   . Diverticulitis   . GERD (gastroesophageal reflux disease)   . MDS (myelodysplastic syndrome), high grade (HCM[place]abar3/30/2016  . Myocardial infarction (H[place]2010  . Depression   . Spontaneous pneumothorax 09/14/2014    LEFT LUNG  . Hypotestosteronemia 10/04/2014  . Esophageal ulcer   :  Past Surgical History  Procedure Laterality Date  . Cardiac stents  2010  . Cholecystectomy N/A 06/19/2013    Procedure: LAPAROSCOPIC CHOLECYSTECTOMY;  Surgeon: DoHarl BowieMD;  [facility] Service: General;  Laterality: N/A;  . Abdominal aortic endovascular stent graft N/A 07/02/2013    Procedure: ABDOMINAL AORTIC ENDOVASCULAR STENT GRAFT;  Surgeon: VaSerafina MitchellMD;  [facility];  Service: Vascular;  Laterality: N/A;  . Esophagogastroduodenoscopy N/A 08/05/2013    Procedure: ESOPHAGOGASTRODUODENOSCOPY (EGD);  Surgeon: ViLear NgMD;  [facility];  Service: Endoscopy;  Laterality: N/A;  . Eye surgery Left     cataract surgery  . Back surgery      cervical fusion  . Colonoscopy with propofol N/A 10/15/2013  Procedure: COLONOSCOPY WITH PROPOFOL;  Surgeon: Lear Ng, MD;  Location: [facility] ENDOSCOPY;  Service: Endoscopy;  Laterality: N/A;  . Fetal blood transfusion  March 16,17,18, 2016  . Bone marrow biopsy  April 01, 2014  . Chest tube insertion  09/14/2014  . Video assisted thoracoscopy Left 10/11/2014    Procedure: VIDEO ASSISTED THORACOSCOPY;  Surgeon: Ivin Poot, MD;  Location: [place];  Service: Thoracic;  Laterality: Left;  . Stapling of blebs Left 10/11/2014    Procedure: STAPLING OF BLEBS;  Surgeon: Ivin Poot, MD;  Location: [facility] OR;  Service: Thoracic;  Laterality: Left;  . Esophagogastroduodenoscopy N/A 11/04/2014    Procedure: ESOPHAGOGASTRODUODENOSCOPY  (EGD);  Surgeon: Laurence Spates, MD;  Location: [facility];  Service: Endoscopy;  Laterality: N/A;  :   Current facility-administered medications:  .  0.9 %  sodium chloride infusion, , Intravenous, Once, Mickel Baas A [place], PA-C .  alum & mag hydroxide-simeth (MAALOX/MYLANTA) 200-200-20 MG/5ML suspension 30 mL, 30 mL, Oral, Q6H PRN, Sid Falcon, MD, 30 mL at 03/06/2015 2301 .  [place] alk-PHENObarbital (DONNATAL) tablet 1 tablet, 1 tablet, Oral, TID PRN, Sid Falcon, MD .  bisacodyl (DULCOLAX) EC tablet 5 mg, 5 mg, Oral, Daily PRN, Sid Falcon, MD .  diphenhydrAMINE (BENADRYL) capsule 25 mg, 25 mg, Oral, Q6H PRN, Sid Falcon, MD .  HYDROmorphone (DILAUDID) injection 1 mg, 1 mg, Intravenous, Q2H PRN, Sid Falcon, MD, 1 mg at 02/17/15 [phone] .  magnesium hydroxide (MILK OF MAGNESIA) suspension 30 mL, 30 mL, Oral, Daily PRN, Sid Falcon, MD, 30 mL at 03/12/2015 2300 .  meningococcal vaccine (MENOMUNE) injection 0.5 mL, 0.5 mL, Subcutaneous, Once, Volanda Napoleon, MD .  multivitamin (PROSIGHT) tablet 1 tablet, 1 tablet, Oral, BID, Sid Falcon, MD, 1 tablet at 02/20/2015 2134 .  nitroGLYCERIN (NITROSTAT) SL tablet 0.4 mg, 0.4 mg, Sublingual, Q5 min PRN, Sid Falcon, MD .  ondansetron [facility]) tablet 4 mg, 4 mg, Oral, Q6H PRN **OR** ondansetron (ZOFRAN) injection 4 mg, 4 mg, Intravenous, Q6H PRN, Sid Falcon, MD, 4 mg at 02/17/15 0131 .  oxyCODONE (Oxy IR/ROXICODONE) immediate release tablet 5 mg, 5 mg, Oral, Q4H PRN, Sid Falcon, MD, 5 mg at 02/17/15 0537 .  pantoprazole (PROTONIX) EC tablet 40 mg, 40 mg, Oral, BID, Sid Falcon, MD, 40 mg at 02/18/2015 2134 .  [START ON 02/18/2015] pneumococcal 23 valent vaccine (PNU-IMMUNE) injection 0.5 mL, 0.5 mL, Intramuscular, Tomorrow-1000, Volanda Napoleon, MD .  simvastatin (ZOCOR) tablet 20 mg, 20 mg, Oral, QHS, Sid Falcon, MD, 20 mg at 02/17/2015 2135 .  sucralfate (CARAFATE) 1 GM/10ML suspension 1 g, 1 g, Oral, TID WC & HS, Sid Falcon, MD, 1 g at 03/08/2015 2135 .  temazepam (RESTORIL) capsule 15 mg, 15 mg, Oral, QHS PRN, Sid Falcon, MD, 15 mg at 03/14/2015 2134:  . sodium chloride   Intravenous Once  . meningococcal vaccine  0.5 mL Subcutaneous Once  . multivitamin  1 tablet Oral BID  . pantoprazole  40 mg Oral BID  . [START ON 02/18/2015] pneumococcal 23 valent vaccine  0.5 mL Intramuscular Tomorrow-1000  . simvastatin  20 mg Oral QHS  . sucralfate  1 g Oral TID WC & HS  :  Allergies  Allergen Reactions  . Albuterol-Ipratropium-Soybean Lecithin [Ipratropium-Albuterol] Anxiety  . Cephalexin Other (See Comments)    Nausea and stomach cramps  . Compazine [Prochlorperazine] Anxiety  . Percocet [Oxycodone-Acetaminophen] Itching  Can take with benadryl    . Phenergan [Promethazine Hcl] Other (See Comments)    "restless legs"  . Tramadol Other (See Comments)    "jerking limbs and talking in his sleep"  . Vicodin [Hydrocodone-Acetaminophen] Itching    Can take with benadryl   . Marinol [Dronabinol] Other (See Comments)    "Affects his eyes"  . Reglan [Metoclopramide] Other (See Comments)    Patient has significant shakes that are troubling. Would not like to receive this if there are other options available.  :  Family History  Problem Relation Age of Onset  . Heart attack Brother     31s  . Cancer Father     Lung  . Cancer Mother     Brain  . Cancer Brother     Kidney  :  Social History   Social History  . Marital Status: Married    Spouse Name: N/A  . Number of Children: N/A  . Years of Education: N/A   Occupational History  . Not on file.   Social History Main Topics  . Smoking status: Former Smoker -- 1.00 packs/day for 60 years    Types: Cigarettes    Start date: 04/05/1964  . Smokeless tobacco: Never Used     Comment: 09/14/2014  . Alcohol Use: No  . Drug Use: No  . Sexual Activity: Not on file   Other Topics Concern  . Not on file   Social History Narrative   Lives in  [place] with wife.  Retired Furniture conservator/restorer.  Works out in the yard often - limited by claudication.  Does not routinely exercise.  :  Pertinent items are noted in HPI.  Exam: Patient Vitals for the past 24 hrs:  BP Temp Temp src Pulse Resp SpO2 Height Weight  02/17/15 0437 130/67 mmHg 98.5 F (36.9 C) Oral 76 18 96 % - -  02/17/15 0145 117/65 mmHg 98.4 F (36.9 C) Oral 80 16 95 % - -  02/17/15 0100 118/69 mmHg 98.7 F (37.1 C) Oral 80 16 95 % - -  02/18/2015 2304 119/60 mmHg 97.9 F (36.6 C) Oral 80 16 94 % - -  03/09/2015 2235 110/68 mmHg 98.4 F (36.9 C) Oral 76 16 94 % - -  02/15/2015 2115 (!) 148/67 mmHg 98.6 F (37 C) Oral 80 16 96 % - -  02/18/2015 1845 113/64 mmHg 98.5 F (36.9 C) Oral 81 16 99 % '5\' 10"'$  (1.778 m) 163 lb 2.3 oz (74 kg)  03/12/2015 1539 118/66 mmHg - - 80 16 95 % - -  02/17/2015 1404 116/77 mmHg - - 81 18 96 % - -  02/24/2015 1219 138/73 mmHg - - 86 18 98 % - -  02/22/2015 1121 136/74 mmHg 98.1 F (36.7 C) Oral 84 20 99 % - -    as above    Recent Labs  03/14/2015 1945 02/17/15 0635  WBC 3.4* 3.8*  HGB 6.6* 8.6*  HCT 20.3* 26.2*  PLT 36* 38*    Recent Labs  02/25/2015 1221  NA 142  K 3.8  CL 107  CO2 24  GLUCOSE 116*  BUN 25*  CREATININE 1.15  CALCIUM 9.0    Blood smear review:  None  Pathology: None     Assessment and Plan:  Mr. Bolender is a 70 year old white male with myelodysplasia. He has refractory anemia with excess blasts. He had been on chemotherapy. He's not had chemotherapy now for about for 5 months.  His splenomegaly is  very impressive. Again I'm not sure as to why this is enlarging quickly. However, his quality of life is suffering.  I will go ahead and administer the pneumococcal vaccine and meningococcal vaccine.  Hopefully, radiology can do a bone marrow test on him today.  I will get surgery. I'll see if they can evaluate him.  I do not see that chemotherapy is going to help the splenomegaly. He really is not a good candidate for  aggressive chemotherapy.  This is a very complex situation.  I appreciate all the help that he is getting up on 3 W.  Frederich Cha 1:5-7

## 2015-02-17 NOTE — Progress Notes (Signed)
Patient ID: [name], male   DOB: October 02, 1945, 70 y.o.   MRN: [id number]    Referring Physician(s): Ennever,P  Chief Complaint: Myelodysplasia/splenomegaly  Subjective: Pt familiar to IR service from prior right chest wall Port-A-Cath placement on 10/06/14 and IVC filter placement 09/18/14; he has a known history of myelodysplasia which has not responded well to treatment, refractory anemia and now presents with massive splenomegaly. Request received for CT-guided bone marrow biopsy. He is currently afebrile, and denies chest pain, cough, nausea/vomiting or abnormal bleeding. He does have some dyspnea secondary to splinting from abdominal pain/splenomegaly.  Allergies: Albuterol-ipratropium-soybean lecithin; Cephalexin; Compazine; Percocet; Phenergan; Tramadol; Vicodin; Marinol; and Reglan  Medications: Prior to Admission medications   Medication Sig Start Date End Date Taking? Authorizing Provider  acetaminophen (TYLENOL) 500 MG tablet Take 500 mg by mouth every 6 (six) hours as needed for fever.   Yes Historical Provider, MD  [place] alk-PHENObarbital (DONNATAL) 16.2 MG tablet Take 1 tablet by mouth every 8 (eight) hours as needed. 12/23/14  Yes Volanda Napoleon, MD  diphenhydrAMINE (BENADRYL) 25 mg capsule Take 1 capsule (25 mg total) by mouth every 6 (six) hours as needed for itching. May take with Percocet PRN 10/17/14  Yes Donielle Liston Alba, PA-C  doxycycline (VIBRA-TABS) 100 MG tablet Take 1 tablet (100 mg total) by mouth 2 (two) times daily. For 7 days. 02/02/15  Yes Volanda Napoleon, MD  HYDROcodone-acetaminophen (NORCO/VICODIN) 5-325 MG tablet Take 1-2 tablets by mouth every 4 (four) hours as needed for moderate pain.   Yes Historical Provider, MD  lidocaine-prilocaine (EMLA) cream Apply 1 application topically as needed. Please dispense 2 tubes 10/12/14  Yes Volanda Napoleon, MD  Multiple Vitamins-Minerals (PRESERVISION AREDS 2 PO) Take by mouth 2 (two) times daily.   Yes  Historical Provider, MD  nitroGLYCERIN (NITROSTAT) 0.4 MG SL tablet Place 1 tablet (0.4 mg total) under the tongue every 5 (five) minutes as needed for chest pain. 03/29/14  Yes Sherren Mocha, MD  oxyCODONE-acetaminophen (PERCOCET) 7.5-325 MG tablet Take 1-2 tablets by mouth every 4 (four) hours as needed for moderate pain or severe pain. 02/15/15  Yes Volanda Napoleon, MD  pantoprazole (PROTONIX) 40 MG tablet Take 1 tablet (40 mg total) by mouth 2 (two) times daily. 01/31/15  Yes Sherren Mocha, MD  predniSONE (DELTASONE) 20 MG tablet Take 20 mg by mouth as needed (chemo reaction and energy "boost.").    Yes Historical Provider, MD  simvastatin (ZOCOR) 20 MG tablet Take 1 tablet (20 mg total) by mouth at bedtime. 03/30/14  Yes Sherren Mocha, MD  sucralfate (CARAFATE) 1 GM/10ML suspension Take 10 mLs (1 g total) by mouth 4 (four) times daily -  with meals and at bedtime. Patient taking differently: Take 1 g by mouth 4 (four) times daily -  with meals and at bedtime. Takes as needed 11/08/14  Yes Robbie Lis, MD  temazepam (RESTORIL) 15 MG capsule Take 15 mg by mouth at bedtime as needed for sleep.   Yes Historical Provider, MD  mupirocin ointment (BACTROBAN) 2 % Apply 3 times a day to affected area under RIGHT ear. Patient not taking: Reported on 03/04/2015 02/02/15   Volanda Napoleon, MD     Vital Signs: BP 130/67 mmHg  Pulse 76  Temp(Src) 98.5 F (36.9 C) (Oral)  Resp 18  Ht '5\' 10"'$  (1.778 m)  Wt 163 lb 2.3 oz (74 kg)  BMI 23.41 kg/m2  SpO2 96%  Physical Exam patient awake, alert. Chest with  clear breath sounds bilaterally. Heart with regular rate and rhythm, ? Soft murmur ;abdomen distended, few bowel sounds, splenomegaly, tender left lateral abdominal region; extremities with no significant edema  Imaging: Ct Abdomen Pelvis W Contrast  03/11/2015  CLINICAL DATA:  70 year old male with myelodysplastic syndrome, left side abdominal pain, splenomegaly on ultrasound. Subsequent encounter. EXAM:  CT ABDOMEN AND PELVIS WITH CONTRAST TECHNIQUE: Multidetector CT imaging of the abdomen and pelvis was performed using the standard protocol following bolus administration of intravenous contrast. CONTRAST:  148m OMNIPAQUE IOHEXOL 300 MG/ML  SOLN COMPARISON:  Ultrasound 02/13/2015. CT Abdomen and Pelvis 11/17/2014 and earlier. FINDINGS: Stable lung bases.  No pericardial or pleural effusion. Chronic left ninth rib fracture appears stable. No acute osseous abnormality identified. Trace pelvic free fluid best seen on coronal image 66, nonspecific. Unremarkable urinary bladder. Negative rectum. Severe diverticulosis of the sigmoid colon, but no active inflammation. Diverticulosis continues into the descending colon which is medially displaced due to the severe splenomegaly. No left colon inflammation. Negative transverse colon, right colon, and appendix. Negative terminal ileum. No dilated small bowel. Decompressed stomach. Negative duodenum ; chronic second portion duodenum diverticulum. Severe splenomegaly, significantly progressed since November. Estimated splenic volume 2092 mL (normal splenic volume range 83 - 412 mL). This is compared to an estimated splenic volume of 1100 mL in November. There is subtle abnormal hypo enhancement along the posterior superior pole of the spleen, best seen on series 2, image 14. There is trace perisplenic fluid/stranding along the inferior pole. No abdominal free air. No other abdominal free fluid identified. Surgically absent gallbladder. Liver, pancreas, adrenal glands (stable chronic left adrenal nodule), and kidneys are stable. Portal venous system remains patent. Sequelae of infrarenal abdominal aortic aneurysm repair with bifurcated endograft. IVC filter in place. The IVC and major pelvic veins appear patent. No lymphadenopathy. IMPRESSION: 1. Severe splenomegaly, nearly doubled in size since November. No evidence of acute splenic rupture, but there is abnormal  hypoenhancement along the superior pole of the spleen and trace perisplenic inflammation or fluid which is nonspecific. Developing splenic infarct is a consideration. 2. Trace pelvic free fluid, favor reactive to #1. 3. Otherwise stable abdomen and pelvis. Electronically Signed   By: HGenevie AnnM.D.   On: 02/26/2015 14:54   [place]Abdomen Limited  02/13/2015  CLINICAL DATA:  Left upper quadrant pain x1 week, splenomegaly EXAM: LIMITED ABDOMINAL ULTRASOUND COMPARISON:  CT abdomen pelvis dated 11/17/2014 FINDINGS: Spleen is enlarged, measuring 18.6 x 18.1 x 9.6 cm (calculated volume 1691 mL). IMPRESSION: Severe splenomegaly, measuring up to 18.6 cm in maximal dimension (calculated volume 1691 mL). Electronically Signed   By: SJulian HyM.D.   On: 02/13/2015 11:22    Labs:  CBC:  Recent Labs  02/13/15 0914 02/22/2015 1221 03/11/2015 1945 02/17/15 0635  WBC 3.1* 3.4* 3.4* 3.8*  HGB 7.4* 7.1* 6.6* 8.6*  HCT 23.2* 21.5* 20.3* 26.2*  PLT 23* 37* 36* 38*    COAGS:  Recent Labs  10/10/14 0540 10/11/14 0552 10/11/14 1300 11/02/14 0443 02/17/15 0635  INR 1.40 1.24 1.32 1.39 1.35  APTT 37 29 27 36  --     BMP:  Recent Labs  11/20/14 1330  02/01/15 1442 02/06/15 0923 02/13/15 0915 03/14/2015 1221 02/17/15 0635  NA 141  < > 133* 135 141 142 140  K 3.3*  < > 4.1 3.6 3.8 3.8 4.0  CL 108  < > 103 103  --  107 106  CO2 23  < > 23 25 20*  24 25  GLUCOSE 135*  < > 134* 99 94 116* 116*  BUN 18  < > 20 17 23.0 25* 22*  CALCIUM 9.1  < > 8.3* 8.7 8.8 9.0 8.8*  CREATININE 1.19  < > 0.97 1.0 1.2 1.15 1.04  GFRNONAA >60  --  80  --   --  >60 >60  GFRAA >60  --  92  --   --  >60 >60  < > = values in this interval not displayed.  LIVER FUNCTION TESTS:  Recent Labs  02/06/15 0923 02/13/15 0915 03/14/2015 1221 02/17/15 0635  BILITOT 1.10 0.57 1.0 1.3*  AST 17 8 10* 10*  ALT 14 10 9* 9*  ALKPHOS 61 67 68 68  PROT 7.1 7.1 7.1 7.1  ALBUMIN 3.7 3.8 4.0 3.8    Assessment and Plan: Pt  with known history of myelodysplasia which has not responded well to treatment, refractory anemia and now presenting with massive splenomegaly. Request received for CT-guided bone marrow biopsy.Risks and Benefits discussed with the patient including, but not limited to bleeding, infection, damage to adjacent structures or low yield requiring additional tests.All of the patient's questions were answered, patient is agreeable to proceed.Consent signed and in chart.      Electronically Signed: D. Rowe Robert 02/17/2015, 9:04 AM   I spent a total of 20 minutes at the the patient's bedside AND on the patient's hospital floor or unit, greater than 50% of which was counseling/coordinating care for CT-guided bone marrow biopsy

## 2015-02-17 NOTE — Consult Note (Signed)
[facility] Surgery Consult Note  [name] 01-30-1945  [id number].    Requesting MD: Dr. Waldron Labs Chief Complaint/Reason for Consult: Splenomegaly  HPI:  70 y/o man with PMH of myelodysplastic syndrome high grade with pancytopenia, anemia, CAD, HTN, AAA with endovascular repair, GERD, Depression who presents with left flank and LUQ pain.  He was diagnosed with MDS, followed by Dr. Marin Olp treated with supportive transfusion, workup significant for splenomegaly, admitted for pain control and further workup.    He has had worsening in his pain for the last couple of weeks, significantly worse in the last week, and it is affecting his quality of life.  7/10 pain at its worst.  He denies N/V, but admits to distension, and constipation.  Denies CP/SOB, fever/chills, dysuria.  No other precipitating/alleviating factors, no radicular pain.  H/o laparoscopic cholecystectomy, VATS for blebs, no h/o hernias.  He just got back from IR where he had a bone marrow biopsy, results pending.  He has recently received his splenectomy vaccines - pneumococcal, meningococcal, haemophilus.  His platelets have been 36-38K/ul the last 3 days.  We were consulted regarding consideration of splenectomy.  ROS: All systems reviewed and otherwise negative except for as above  Family History  Problem Relation Age of Onset  . Heart attack Brother     25s  . Cancer Father     Lung  . Cancer Mother     Brain  . Cancer Brother     Kidney    Past Medical History  Diagnosis Date  . Coronary atherosclerosis of native coronary artery     a. 10/2008 inf STEMI/PCI: LM 50d (IVUS-borderline lesion->med rx), LAD min irregs, LCX 36m 70d, OM nl, RCA [id number] Vision BMS);  b. 11/2008 Lexiscan MV: EF 65%, no isch/scar.  . Essential hypertension, benign   . Pure hypercholesterolemia   . AAA (abdominal aortic aneurysm) ([place]    a. 05/2013 CT: 5.8 cm AAA.  . Marland Kitcheniverticulitis     a. 05/2013 CT: descending/sigmoid jxn  w/o abscess.  . Osteoarthritis   . Tobacco abuse     a. ongoing - 1ppd for better part of 50 yrs.  . Normocytic anemia   . Cellulitis 06/10/2013    Right antecubital fossa at site of IV  03/12/13  . PONV (postoperative nausea and vomiting)   . Diverticulitis   . GERD (gastroesophageal reflux disease)   . MDS (myelodysplastic syndrome), high grade (HCAlfalfa3/30/2016  . Myocardial infarction (HCGardiner2010  . Depression   . Spontaneous pneumothorax 09/14/2014    LEFT LUNG  . Hypotestosteronemia 10/04/2014  . Esophageal ulcer     Past Surgical History  Procedure Laterality Date  . Cardiac stents  2010  . Cholecystectomy N/A 06/19/2013    Procedure: LAPAROSCOPIC CHOLECYSTECTOMY;  Surgeon: DoHarl BowieMD;  [place] Service: General;  Laterality: N/A;  . Abdominal aortic endovascular stent graft N/A 07/02/2013    Procedure: ABDOMINAL AORTIC ENDOVASCULAR STENT GRAFT;  Surgeon: VaSerafina MitchellMD;  [facility];  Service: Vascular;  Laterality: N/A;  . Esophagogastroduodenoscopy N/A 08/05/2013    Procedure: ESOPHAGOGASTRODUODENOSCOPY (EGD);  Surgeon: ViLear NgMD;  [facility];  Service: Endoscopy;  Laterality: N/A;  . Eye surgery Left     cataract surgery  . Back surgery      cervical fusion  . Colonoscopy with propofol N/A 10/15/2013    Procedure: COLONOSCOPY WITH PROPOFOL;  Surgeon: ViLear NgMD;  [place] [facility]  Service: Endoscopy;  Laterality: N/A;  . Fetal blood transfusion  March 16,17,18, 2016  . Bone marrow biopsy  April 01, 2014  . Chest tube insertion  09/14/2014  . Video assisted thoracoscopy Left 10/11/2014    Procedure: VIDEO ASSISTED THORACOSCOPY;  Surgeon: Ivin Poot, MD;  Location: [place];  Service: Thoracic;  Laterality: Left;  . Stapling of blebs Left 10/11/2014    Procedure: STAPLING OF BLEBS;  Surgeon: Ivin Poot, MD;  Location: [facility] OR;  Service: Thoracic;  Laterality: Left;  . Esophagogastroduodenoscopy N/A  11/04/2014    Procedure: ESOPHAGOGASTRODUODENOSCOPY (EGD);  Surgeon: Laurence Spates, MD;  Location: [facility];  Service: Endoscopy;  Laterality: N/A;    Social History:  reports that he has quit smoking. His smoking use included Cigarettes. He started smoking about 50 years ago. He has a 60 pack-year smoking history. He has never used smokeless tobacco. He reports that he does not drink alcohol or use illicit drugs.  Allergies:  Allergies  Allergen Reactions  . Albuterol-Ipratropium-Soybean Lecithin [Ipratropium-Albuterol] Anxiety  . Cephalexin Other (See Comments)    Nausea and stomach cramps  . Compazine [Prochlorperazine] Anxiety  . Percocet [Oxycodone-Acetaminophen] Itching    Can take with benadryl    . Phenergan [Promethazine Hcl] Other (See Comments)    "restless legs"  . Tramadol Other (See Comments)    "jerking limbs and talking in his sleep"  . Vicodin [Hydrocodone-Acetaminophen] Itching    Can take with benadryl   . Marinol [Dronabinol] Other (See Comments)    "Affects his eyes"  . Reglan [Metoclopramide] Other (See Comments)    Patient has significant shakes that are troubling. Would not like to receive this if there are other options available.    Medications Prior to Admission  Medication Sig Dispense Refill  . acetaminophen (TYLENOL) 500 MG tablet Take 500 mg by mouth every 6 (six) hours as needed for fever.    [place] Rocker alk-PHENObarbital (DONNATAL) 16.2 MG tablet Take 1 tablet by mouth every 8 (eight) hours as needed. 120 tablet 2  . diphenhydrAMINE (BENADRYL) 25 mg capsule Take 1 capsule (25 mg total) by mouth every 6 (six) hours as needed for itching. May take with Percocet PRN 30 capsule 0  . doxycycline (VIBRA-TABS) 100 MG tablet Take 1 tablet (100 mg total) by mouth 2 (two) times daily. For 7 days. 14 tablet 0  . HYDROcodone-acetaminophen (NORCO/VICODIN) 5-325 MG tablet Take 1-2 tablets by mouth every 4 (four) hours as needed for moderate pain.    Marland Kitchen  lidocaine-prilocaine (EMLA) cream Apply 1 application topically as needed. Please dispense 2 tubes 60 g 3  . Multiple Vitamins-Minerals (PRESERVISION AREDS 2 PO) Take by mouth 2 (two) times daily.    . nitroGLYCERIN (NITROSTAT) 0.4 MG SL tablet Place 1 tablet (0.4 mg total) under the tongue every 5 (five) minutes as needed for chest pain. 25 tablet 6  . oxyCODONE-acetaminophen (PERCOCET) 7.5-325 MG tablet Take 1-2 tablets by mouth every 4 (four) hours as needed for moderate pain or severe pain. 240 tablet 0  . pantoprazole (PROTONIX) 40 MG tablet Take 1 tablet (40 mg total) by mouth 2 (two) times daily.    . predniSONE (DELTASONE) 20 MG tablet Take 20 mg by mouth as needed (chemo reaction and energy "boost.").     . simvastatin (ZOCOR) 20 MG tablet Take 1 tablet (20 mg total) by mouth at bedtime. 30 tablet 5  . sucralfate (CARAFATE) 1 GM/10ML suspension Take 10 mLs (1 g total) by mouth  4 (four) times daily -  with meals and at bedtime. (Patient taking differently: Take 1 g by mouth 4 (four) times daily -  with meals and at bedtime. Takes as needed) 420 mL 0  . temazepam (RESTORIL) 15 MG capsule Take 15 mg by mouth at bedtime as needed for sleep.    . mupirocin ointment (BACTROBAN) 2 % Apply 3 times a day to affected area under RIGHT ear. (Patient not taking: Reported on 02/18/2015) 22 g 0    Blood pressure 131/88, pulse 78, temperature 98.5 F (36.9 C), temperature source Oral, resp. rate 13, height '5\' 10"'$  (1.778 m), weight 74 kg (163 lb 2.3 oz), SpO2 98 %. Physical Exam: General: pleasant, WD/WN white male who is laying in bed in NAD HEENT: head is normocephalic, atraumatic.  Sclera are noninjected.  PERRL.  Ears and nose without any masses or lesions.  Mouth is pink and moist Heart: regular, rate, and rhythm.  No obvious murmurs, gallops, or rubs noted.  Palpable pedal pulses bilaterally Lungs: CTAB, no wheezes, rhonchi, or rales noted.  Respiratory effort nonlabored Abd: Rotund, firm, distended,  quite tender in LUQ and flank, liver and spleen palpated well below rib margins, spleen palpated to ASIS, +BS, no hernias, laparoscopic scars from chole, VATS scars on left posterior/lateral chest, no laparotomy scars MS: all 4 extremities are symmetrical with no cyanosis, clubbing, or edema. Skin: warm and dry with no masses, lesions, or rashes Psych: A&Ox3 with an appropriate affect.   Results for orders placed or performed during the hospital encounter of 02/20/2015 (from the past 48 hour(s))  Comprehensive metabolic panel     Status: Abnormal   Collection Time: 02/27/2015 12:21 PM  Result Value Ref Range   Sodium 142 135 - 145 mmol/L   Potassium 3.8 3.5 - 5.1 mmol/L   Chloride 107 101 - 111 mmol/L   CO2 24 22 - 32 mmol/L   Glucose, Bld 116 (H) 65 - 99 mg/dL   BUN 25 (H) 6 - 20 mg/dL   Creatinine, Ser 1.15 0.61 - 1.24 mg/dL   Calcium 9.0 8.9 - 10.3 mg/dL   Total Protein 7.1 6.5 - 8.1 g/dL   Albumin 4.0 3.5 - 5.0 g/dL   AST 10 (L) 15 - 41 U/L   ALT 9 (L) 17 - 63 U/L   Alkaline Phosphatase 68 38 - 126 U/L   Total Bilirubin 1.0 0.3 - 1.2 mg/dL   GFR calc non Af Amer >60 >60 mL/min   GFR calc Af Amer >60 >60 mL/min    Comment: (NOTE) The eGFR has been calculated using the CKD EPI equation. This calculation has not been validated in all clinical situations. eGFR's persistently <60 mL/min signify possible Chronic Kidney Disease.    Anion gap 11 5 - 15  CBC with Differential     Status: Abnormal   Collection Time: 03/10/2015 12:21 PM  Result Value Ref Range   WBC 3.4 (L) 4.0 - 10.5 K/uL   RBC 2.63 (L) 4.22 - 5.81 MIL/uL   Hemoglobin 7.1 (L) 13.0 - 17.0 g/dL   HCT 21.5 (L) 39.0 - 52.0 %   MCV 81.7 78.0 - 100.0 fL   MCH 27.0 26.0 - 34.0 pg   MCHC 33.0 30.0 - 36.0 g/dL   RDW 17.6 (H) 11.5 - 15.5 %   Platelets 37 (L) 150 - 400 K/uL    Comment: SPECIMEN CHECKED FOR CLOTS REPEATED TO VERIFY PLATELET COUNT CONFIRMED BY SMEAR LARGE PLATELETS PRESENT  Neutrophils Relative % 37 %    Lymphocytes Relative 18 %   Monocytes Relative 28 %   Eosinophils Relative 0 %   Basophils Relative 1 %   Blasts 16 %   nRBC 3 (H) 0 /100 WBC   Neutro Abs 1.3 (L) 1.7 - 7.7 K/uL   Lymphs Abs 0.6 (L) 0.7 - 4.0 K/uL   Monocytes Absolute 1.0 0.1 - 1.0 K/uL   Eosinophils Absolute 0.0 0.0 - 0.7 K/uL   Basophils Absolute 0.0 0.0 - 0.1 K/uL   RBC Morphology POLYCHROMASIA PRESENT     Comment: RARE NRBCs   Smear Review LARGE PLATELETS PRESENT   Pathologist smear review     Status: None   Collection Time: 02/19/2015  1:30 PM  Result Value Ref Range   Path Review Reviewed By Violet Baldy, M.D.     Comment: 2.2.17 PANCYTOPENIA AND CIRCULATING BLASTS.   CBC     Status: Abnormal   Collection Time: 03/11/2015  7:45 PM  Result Value Ref Range   WBC 3.4 (L) 4.0 - 10.5 K/uL   RBC 2.48 (L) 4.22 - 5.81 MIL/uL   Hemoglobin 6.6 (LL) 13.0 - 17.0 g/dL    Comment: REPEATED TO VERIFY CRITICAL RESULT CALLED TO, READ BACK BY AND VERIFIED WITH: S HAMM RN 2015 02/28/2015 A NAVARRO    HCT 20.3 (L) 39.0 - 52.0 %   MCV 81.9 78.0 - 100.0 fL   MCH 26.6 26.0 - 34.0 pg   MCHC 32.5 30.0 - 36.0 g/dL   RDW 17.5 (H) 11.5 - 15.5 %   Platelets 36 (L) 150 - 400 K/uL    Comment: REPEATED TO VERIFY SPECIMEN CHECKED FOR CLOTS CONSISTENT WITH PREVIOUS RESULT   Type and screen [place]     Status: None (Preliminary result)   Collection Time: 02/23/2015  8:40 PM  Result Value Ref Range   ABO/RH(D) A POS    Antibody Screen NEG    Sample Expiration 02/19/2015    Unit Number [id number]    Blood Component Type RBC, LR IRR    Unit division 00    Status of Unit ISSUED,FINAL    Transfusion Status OK TO TRANSFUSE    Crossmatch Result Compatible    Unit Number [id number]    Blood Component Type RBC, LR IRR    Unit division 00    Status of Unit ISSUED    Transfusion Status OK TO TRANSFUSE    Crossmatch Result Compatible    Unit Number [id number]    Blood Component Type RBC, LR IRR    Unit  division 00    Status of Unit ALLOCATED    Transfusion Status OK TO TRANSFUSE    Crossmatch Result Compatible    Unit Number [id number]    Blood Component Type RBC, LR IRR    Unit division 00    Status of Unit ALLOCATED    Transfusion Status OK TO TRANSFUSE    Crossmatch Result Compatible   Prepare RBC     Status: None   Collection Time: 02/24/2015  8:40 PM  Result Value Ref Range   Order Confirmation ORDER PROCESSED BY BLOOD BANK   Glucose, capillary     Status: None   Collection Time: 03/13/2015  9:16 PM  Result Value Ref Range   Glucose-Capillary 98 65 - 99 mg/dL  Prepare RBC     Status: None   Collection Time: 03/10/2015  9:30 PM  Result Value Ref Range   Order Confirmation ORDER  PROCESSED BY BLOOD BANK   Comprehensive metabolic panel     Status: Abnormal   Collection Time: 02/17/15  6:35 AM  Result Value Ref Range   Sodium 140 135 - 145 mmol/L   Potassium 4.0 3.5 - 5.1 mmol/L   Chloride 106 101 - 111 mmol/L   CO2 25 22 - 32 mmol/L   Glucose, Bld 116 (H) 65 - 99 mg/dL   BUN 22 (H) 6 - 20 mg/dL   Creatinine, Ser 1.04 0.61 - 1.24 mg/dL   Calcium 8.8 (L) 8.9 - 10.3 mg/dL   Total Protein 7.1 6.5 - 8.1 g/dL   Albumin 3.8 3.5 - 5.0 g/dL   AST 10 (L) 15 - 41 U/L   ALT 9 (L) 17 - 63 U/L   Alkaline Phosphatase 68 38 - 126 U/L   Total Bilirubin 1.3 (H) 0.3 - 1.2 mg/dL   GFR calc non Af Amer >60 >60 mL/min   GFR calc Af Amer >60 >60 mL/min    Comment: (NOTE) The eGFR has been calculated using the CKD EPI equation. This calculation has not been validated in all clinical situations. eGFR's persistently <60 mL/min signify possible Chronic Kidney Disease.    Anion gap 9 5 - 15  CBC     Status: Abnormal   Collection Time: 02/17/15  6:35 AM  Result Value Ref Range   WBC 3.8 (L) 4.0 - 10.5 K/uL   RBC 3.09 (L) 4.22 - 5.81 MIL/uL   Hemoglobin 8.6 (L) 13.0 - 17.0 g/dL    Comment: REPEATED TO VERIFY DELTA CHECK NOTED POST TRANSFUSION SPECIMEN    HCT 26.2 (L) 39.0 - 52.0 %   MCV  84.8 78.0 - 100.0 fL   MCH 27.8 26.0 - 34.0 pg   MCHC 32.8 30.0 - 36.0 g/dL   RDW 16.5 (H) 11.5 - 15.5 %   Platelets 38 (L) 150 - 400 K/uL  Protime-INR     Status: Abnormal   Collection Time: 02/17/15  6:35 AM  Result Value Ref Range   Prothrombin Time 16.8 (H) 11.6 - 15.2 seconds   INR 1.35 0.00 - 1.49  Glucose, capillary     Status: Abnormal   Collection Time: 02/17/15  7:38 AM  Result Value Ref Range   Glucose-Capillary 102 (H) 65 - 99 mg/dL   Comment 1 Notify RN    Comment 2 Document in Chart   Glucose, capillary     Status: Abnormal   Collection Time: 02/17/15 12:07 PM  Result Value Ref Range   Glucose-Capillary 109 (H) 65 - 99 mg/dL   Ct Abdomen Pelvis W Contrast  02/15/2015  CLINICAL DATA:  70 year old male with myelodysplastic syndrome, left side abdominal pain, splenomegaly on ultrasound. Subsequent encounter. EXAM: CT ABDOMEN AND PELVIS WITH CONTRAST TECHNIQUE: Multidetector CT imaging of the abdomen and pelvis was performed using the standard protocol following bolus administration of intravenous contrast. CONTRAST:  132m OMNIPAQUE IOHEXOL 300 MG/ML  SOLN COMPARISON:  Ultrasound 02/13/2015. CT Abdomen and Pelvis 11/17/2014 and earlier. FINDINGS: Stable lung bases.  No pericardial or pleural effusion. Chronic left ninth rib fracture appears stable. No acute osseous abnormality identified. Trace pelvic free fluid best seen on coronal image 66, nonspecific. Unremarkable urinary bladder. Negative rectum. Severe diverticulosis of the sigmoid colon, but no active inflammation. Diverticulosis continues into the descending colon which is medially displaced due to the severe splenomegaly. No left colon inflammation. Negative transverse colon, right colon, and appendix. Negative terminal ileum. No dilated small bowel. Decompressed stomach. Negative  duodenum ; chronic second portion duodenum diverticulum. Severe splenomegaly, significantly progressed since November. Estimated splenic volume  2092 mL (normal splenic volume range 83 - 412 mL). This is compared to an estimated splenic volume of 1100 mL in November. There is subtle abnormal hypo enhancement along the posterior superior pole of the spleen, best seen on series 2, image 14. There is trace perisplenic fluid/stranding along the inferior pole. No abdominal free air. No other abdominal free fluid identified. Surgically absent gallbladder. Liver, pancreas, adrenal glands (stable chronic left adrenal nodule), and kidneys are stable. Portal venous system remains patent. Sequelae of infrarenal abdominal aortic aneurysm repair with bifurcated endograft. IVC filter in place. The IVC and major pelvic veins appear patent. No lymphadenopathy. IMPRESSION: 1. Severe splenomegaly, nearly doubled in size since November. No evidence of acute splenic rupture, but there is abnormal hypoenhancement along the superior pole of the spleen and trace perisplenic inflammation or fluid which is nonspecific. Developing splenic infarct is a consideration. 2. Trace pelvic free fluid, favor reactive to #1. 3. Otherwise stable abdomen and pelvis. Electronically Signed   By: Odessa Fleming M.D.   On: 02/23/2015 14:54   Ct Biopsy  02/17/2015  CLINICAL DATA:  Myelodysplastic syndrome, splenomegaly EXAM: CT GUIDED DEEP ILIAC BONE ASPIRATION AND CORE BIOPSY TECHNIQUE: The procedure, risks (including but not limited to bleeding, infection, organ damage ), benefits, and alternatives were explained to the patient. Questions regarding the procedure were encouraged and answered. The patient understands and consents to the procedure. Patient was placed supine on the CT gantry and limited axial scans through the pelvis were obtained. Appropriate skin entry site was identified. Skin site was marked, prepped with Betadine, draped in usual sterile fashion, and infiltrated locally with 1% lidocaine. Intravenous Fentanyl and Versed were administered as conscious sedation during continuous  monitoring of the patient's level of consciousness and physiological / cardiorespiratory status by the radiology RN, with a total moderate sedation time of 10 minutes. Under CT fluoroscopic guidance an 11-gauge Cook trocar bone needle was advanced into the right iliac bone just lateral to the sacroiliac joint. Once needle tip position was confirmed, coaxial core and aspiration samples were obtained. The final sample was obtained using the guiding needle itself, which was then removed. Post procedure scans show no hematoma or fracture. Patient tolerated procedure well. COMPLICATIONS: COMPLICATIONS none IMPRESSION: 1. Technically successful CT guided right iliac bone core and aspiration biopsy. Electronically Signed   By: Corlis Leak M.D.   On: 02/17/2015 11:50      Assessment/Plan Massive splenomegaly 2L in estimated volume and abdominal pain -Discussed with Dr. Ezzard Standing, he will be the DOW next week and will plan to proceed with splenectomy.  Will leave recommendations about laparoscopic vs open up to him and oncology.   -Already received splenectomy vaccines-  pneumococcal, meningococcal, haemophilus. -Thrombocytopenia is the major issue, he will likely need transfusions leading up to surgery and intra-operative to keep him above 50K/uL.   -Appreciate Medicine and Oncology care  Myelodysplastic syndrome with high grade pancytopenia -pending bone marrow biopsy results  CAD/HTN/HLD/GERD H/o diverticulitis/diverticulosis H/o DVT s/p IVC filter (no anticoagulation [facility] to thrombocytopenia) H/o lap chole, left VATS, and endoscopic AAA repair   Nonie Hoyer, [facility] Surgery 02/17/2015, 12:47 PM Pager: [phone]  Agree with above. Dr. Myna Hidalgo has discussed this case with me.  I am our surgeon on the week from Feb 6 - Feb 10.  Will plan surgery during that week.  Ovidio Kin, MD, [facility]  [facility] Pager: [phone] Office phone:  [phone]

## 2015-02-17 NOTE — Progress Notes (Signed)
Patient Demographics  [name], is a 70 y.o. male, DOB - [birth year], LKT:[id number]  Admit date - 03/10/2015   Admitting Physician Sid Falcon, MD  Outpatient Primary MD for the patient is Shirline Frees, MD  LOS - 1   Chief Complaint  Patient presents with  . Abdominal Pain       Admission HPI/Brief narrative: 70yo man with PMH of MDS, anemia, CAD, HTN, AAA with repair, GERD, Depression who presents with left flank and upper quadrant pain, known history of MDS, followed by Dr. Anderson Malta, treated with supportive transfusion, workup significant for splenomegaly, admitted for pain control and further workup. Subjective:   [name] today has, No headache, No chest pain,No Cough - SOB, complaints of abdominal pain .  Assessment & Plan    Active Problems:   Essential hypertension   Coronary atherosclerosis of native coronary artery   Pancytopenia (HCC)   MDS (myelodysplastic syndrome), high grade (HCC)   Abdominal pain   Splenomegaly   Absolute anemia  Splenomegaly and Abdominal pain - Patient with known history of MDS, oncology consulted, unclear to change of his splenomegaly and if it is related to his MDS. - continue with pain medication on an as-needed basis. - Per oncology may eventually need splenectomy, receiving pneumococcal/meningococcal/Haemophilus vaccine, will consult surgery    MDS (myelodysplastic syndrome), high grade with pancytopenia - Oncology consult appreciated, and is meant with transfusion support. - Received 2 units PRBC overnight given hemoglobin of 6.8. - No active bleeding, no indication to transfuse platelet - Patient went for bone marrow CT-guided biopsy this morning.   Coronary atherosclerosis of native coronary artery - Only home med appears to be PRN nitro, this was continued  GERD - Continue protonix, sucralfate, donnatal.   History of  DVT - Status post IVC filter, no anticoagulation secondary to thrombocytopenia  Code Status: Full  Family Communication: none at bedside  Disposition Plan: pending further work up.   Procedures  Bone marrow CT-guided biopsy 02/17/2015 by IR   2 units PRBC transfusion 02/21/2015   Consults   Oncology Gen. surgery   Medications  Scheduled Meds: . sodium chloride   Intravenous Once  . fentaNYL  25 mcg Transdermal Q72H  . fentaNYL      . haemophilus B polysaccharide conjugate vaccine  0.5 mL Intramuscular Once  . meningococcal vaccine  0.5 mL Subcutaneous Once  . midazolam      . multivitamin  1 tablet Oral BID  . pantoprazole  40 mg Oral BID  . pneumococcal 23 valent vaccine  0.5 mL Intramuscular Once  . simvastatin  20 mg Oral QHS  . sucralfate  1 g Oral TID WC & HS   Continuous Infusions:  PRN Meds:.alum & mag hydroxide-simeth, bell[place]ona alk-PHENObarbital, bisacodyl, diphenhydrAMINE, HYDROmorphone (DILAUDID) injection, HYDROmorphone (DILAUDID) injection, magnesium hydroxide, nitroGLYCERIN, ondansetron **OR** ondansetron (ZOFRAN) IV, temazepam  DVT Prophylaxis   SCDs, has IVC filter  Lab Results  Component Value Date   PLT 38* 02/17/2015    Antibiotics    Anti-infectives    None          Objective:   Filed Vitals:   02/17/15 1101 02/17/15 1103 02/17/15 1108 02/17/15 1113  BP: 135/71 118/60 119/72 131/88  Pulse: 80 81  78 78  Temp:      TempSrc:      Resp: _0 Height:      Weight:      SpO2: 100% 100% 98% 98%    Wt Readings from Last 3 Encounters:  02/17/2015 74 kg (163 lb 2.3 oz)  02/01/15 75.297 kg (166 lb)  01/25/15 75.206 kg (165 lb 12.8 oz)     Intake/Output Summary (Last 24 hours) at 02/17/15 1144 Last data filed at 02/17/15 0412  Gross per 24 hour  Intake    360 ml  Output    420 ml  Net    -60 ml     Physical Exam  Awake Alert, Oriented X 3, No new F.N deficits, Normal affect .AT,PERRAL Supple Neck,No JVD Symmetrical  Chest wall movement, Good air movement bilaterally,  RRR,No Gallops,Rubs or new Murmurs, No Parasternal Heave +ve B.Sounds,palpable spleen, diffuse tenderness with guarding . No Cyanosis, Clubbing or edema, No new Rash or bruise    Data Review   Micro Results No results found for this or any previous visit (from the past 240 hour(s)).  Radiology Reports Ct Abdomen Pelvis W Contrast  02/24/2015  CLINICAL DATA:  70 year old male with myelodysplastic syndrome, left side abdominal pain, splenomegaly on ultrasound. Subsequent encounter. EXAM: CT ABDOMEN AND PELVIS WITH CONTRAST TECHNIQUE: Multidetector CT imaging of the abdomen and pelvis was performed using the standard protocol following bolus administration of intravenous contrast. CONTRAST:  13m OMNIPAQUE IOHEXOL 300 MG/ML  SOLN COMPARISON:  Ultrasound 02/13/2015. CT Abdomen and Pelvis 11/17/2014 and earlier. FINDINGS: Stable lung bases.  No pericardial or pleural effusion. Chronic left ninth rib fracture appears stable. No acute osseous abnormality identified. Trace pelvic free fluid best seen on coronal image 66, nonspecific. Unremarkable urinary bladder. Negative rectum. Severe diverticulosis of the sigmoid colon, but no active inflammation. Diverticulosis continues into the descending colon which is medially displaced due to the severe splenomegaly. No left colon inflammation. Negative transverse colon, right colon, and appendix. Negative terminal ileum. No dilated small bowel. Decompressed stomach. Negative duodenum ; chronic second portion duodenum diverticulum. Severe splenomegaly, significantly progressed since November. Estimated splenic volume 2092 mL (normal splenic volume range 83 - 412 mL). This is compared to an estimated splenic volume of 1100 mL in November. There is subtle abnormal hypo enhancement along the posterior superior pole of the spleen, best seen on series 2, image 14. There is trace perisplenic fluid/stranding along the  inferior pole. No abdominal free air. No other abdominal free fluid identified. Surgically absent gallbladder. Liver, pancreas, adrenal glands (stable chronic left adrenal nodule), and kidneys are stable. Portal venous system remains patent. Sequelae of infrarenal abdominal aortic aneurysm repair with bifurcated endograft. IVC filter in place. The IVC and major pelvic veins appear patent. No lymphadenopathy. IMPRESSION: 1. Severe splenomegaly, nearly doubled in size since November. No evidence of acute splenic rupture, but there is abnormal hypoenhancement along the superior pole of the spleen and trace perisplenic inflammation or fluid which is nonspecific. Developing splenic infarct is a consideration. 2. Trace pelvic free fluid, favor reactive to #1. 3. Otherwise stable abdomen and pelvis. Electronically Signed   By: HGenevie AnnM.D.   On: 03/07/2015 14:54   UKoreaAbdomen Limited  02/13/2015  CLINICAL DATA:  Left upper quadrant pain x1 week, splenomegaly EXAM: LIMITED ABDOMINAL ULTRASOUND COMPARISON:  CT abdomen pelvis dated 11/17/2014 FINDINGS: Spleen is enlarged, measuring 18.6 x 18.1 x 9.6 cm (calculated volume 1691 mL). IMPRESSION: Severe splenomegaly, measuring up  to 18.6 cm in maximal dimension (calculated volume 1691 mL). Electronically Signed   By: Julian Hy M.D.   On: 02/13/2015 11:22   US Venous Img Lower Bilateral  01/18/2015  CLINICAL DATA:  70 year old male with a history of acute left popliteal DVT for which an IVC filter was placed in September of 2016. He presents for follow-up evaluation prior to discussion regarding the possibility of a IVC filter removal. EXAM: LEFT LOWER EXTREMITY VENOUS DOPPLER ULTRASOUND TECHNIQUE: Gray-scale sonography with graded compression, as well as color Doppler and duplex ultrasound were performed to evaluate the lower extremity deep venous systems from the level of the common femoral vein and including the common femoral, femoral, profunda femoral, popliteal  and calf veins including the posterior tibial, peroneal and gastrocnemius veins when visible. The superficial great saphenous vein was also interrogated. Spectral Doppler was utilized to evaluate flow at rest and with distal augmentation maneuvers in the common femoral, femoral and popliteal veins. COMPARISON:  The report from a prior in hospital DVT ultrasound is available for review FINDINGS: Contralateral Common Femoral Vein: Respiratory phasicity is normal and symmetric with the symptomatic side. No evidence of thrombus. Normal compressibility. Common Femoral Vein: No evidence of thrombus. Normal compressibility, respiratory phasicity and response to augmentation. Saphenofemoral Junction: No evidence of thrombus. Normal compressibility and flow on color Doppler imaging. Profunda Femoral Vein: No evidence of thrombus. Normal compressibility and flow on color Doppler imaging. Femoral Vein: No evidence of thrombus. Normal compressibility, respiratory phasicity and response to augmentation. Popliteal Vein: No evidence of thrombus. Normal compressibility, respiratory phasicity and response to augmentation. Calf Veins: No evidence of thrombus. Normal compressibility and flow on color Doppler imaging. Superficial Great Saphenous Vein: No evidence of thrombus. Normal compressibility and flow on color Doppler imaging. Venous Reflux:  None. Other Findings:  None. IMPRESSION: No evidence of deep venous thrombosis. Electronically Signed   By: Jacqulynn Cadet M.D.   On: 01/18/2015 14:40     CBC  Recent Labs Lab 02/13/15 0914 03/13/2015 1221 02/15/2015 1945 02/17/15 0635  WBC 3.1* 3.4* 3.4* 3.8*  HGB 7.4* 7.1* 6.6* 8.6*  HCT 23.2* 21.5* 20.3* 26.2*  PLT 23* 37* 36* 38*  MCV 85 81.7 81.9 84.8  MCH 27.0* 27.0 26.6 27.8  MCHC 31.9* 33.0 32.5 32.8  RDW 17.2* 17.6* 17.5* 16.5*  LYMPHSABS  --  0.6*  --   --   MONOABS  --  1.0  --   --   EOSABS  --  0.0  --   --   BASOSABS  --  0.0  --   --     Chemistries    Recent Labs Lab 02/13/15 0915 02/17/2015 1221 02/17/15 0635  NA 141 142 140  K 3.8 3.8 4.0  CL  --  107 106  CO2 20* 24 25  GLUCOSE 94 116* 116*  BUN 23.0 25* 22*  CREATININE 1.2 1.15 1.04  CALCIUM 8.8 9.0 8.8*  AST 8 10* 10*  ALT 10 9* 9*  ALKPHOS 67 68 68  BILITOT 0.57 1.0 1.3*   ------------------------------------------------------------------------------------------------------------------ estimated creatinine clearance is 70.2 mL/min (by C-G formula based on Cr of 1.04). ------------------------------------------------------------------------------------------------------------------ No results for input(s): HGBA1C in the last 72 hours. ------------------------------------------------------------------------------------------------------------------ No results for input(s): CHOL, HDL, LDLCALC, TRIG, CHOLHDL, LDLDIRECT in the last 72 hours. ------------------------------------------------------------------------------------------------------------------ No results for input(s): TSH, T4TOTAL, T3FREE, THYROIDAB in the last 72 hours.  Invalid input(s): FREET3 ------------------------------------------------------------------------------------------------------------------ No results for input(s): VITAMINB12, FOLATE, FERRITIN, TIBC, IRON, RETICCTPCT in the last  72 hours.  Coagulation profile  Recent Labs Lab 02/17/15 0635  INR 1.35    No results for input(s): DDIMER in the last 72 hours.  Cardiac Enzymes No results for input(s): CKMB, TROPONINI, MYOGLOBIN in the last 168 hours.  Invalid input(s): CK ------------------------------------------------------------------------------------------------------------------ Invalid input(s): POCBNP     Time Spent in minutes   30 minutes   Dracen Reigle M.D on 02/17/2015 at 11:44 AM  Between 7am to 7pm - Pager - [phone]  After 7pm go to [email or web address] - password [facility]  [facility]   Office   [phone]

## 2015-02-17 NOTE — Procedures (Signed)
CT-guided  R iliac bone marrow aspiration and core biopsy No complication No blood loss. See complete dictation in [facility] PACS  

## 2015-02-18 ENCOUNTER — Encounter (HOSPITAL_COMMUNITY): Payer: Self-pay | Admitting: *Deleted

## 2015-02-18 LAB — CBC
HEMATOCRIT: 25.4 % — AB (ref 39.0–52.0)
HEMOGLOBIN: 8.3 g/dL — AB (ref 13.0–17.0)
MCH: 27 pg (ref 26.0–34.0)
MCHC: 32.7 g/dL (ref 30.0–36.0)
MCV: 82.7 fL (ref 78.0–100.0)
Platelets: 33 10*3/uL — ABNORMAL LOW (ref 150–400)
RBC: 3.07 MIL/uL — AB (ref 4.22–5.81)
RDW: 16.6 % — ABNORMAL HIGH (ref 11.5–15.5)
WBC: 4.2 10*3/uL (ref 4.0–10.5)

## 2015-02-18 LAB — BASIC METABOLIC PANEL
Anion gap: 9 (ref 5–15)
BUN: 23 mg/dL — ABNORMAL HIGH (ref 6–20)
CHLORIDE: 103 mmol/L (ref 101–111)
CO2: 24 mmol/L (ref 22–32)
CREATININE: 1.21 mg/dL (ref 0.61–1.24)
Calcium: 8.4 mg/dL — ABNORMAL LOW (ref 8.9–10.3)
GFR calc non Af Amer: 60 mL/min — ABNORMAL LOW (ref >60)
Glucose, Bld: 119 mg/dL — ABNORMAL HIGH (ref 65–99)
POTASSIUM: 4.1 mmol/L (ref 3.5–5.1)
SODIUM: 136 mmol/L (ref 135–145)

## 2015-02-18 MED ORDER — LORATADINE 10 MG PO TABS
10.0000 mg | ORAL_TABLET | Freq: Every day | ORAL | Status: DC
  Administered 2015-02-18 – 2015-02-26 (×8): 10 mg via ORAL
  Filled 2015-02-18 (×11): qty 1

## 2015-02-18 MED ORDER — SODIUM CHLORIDE 0.9 % IV SOLN
INTRAVENOUS | Status: DC
  Administered 2015-02-18 – 2015-02-26 (×11): via INTRAVENOUS

## 2015-02-18 MED ORDER — FLUTICASONE PROPIONATE 50 MCG/ACT NA SUSP
2.0000 | Freq: Every day | NASAL | Status: DC
  Administered 2015-02-18 – 2015-02-26 (×8): 2 via NASAL
  Filled 2015-02-18 (×2): qty 16

## 2015-02-18 NOTE — Progress Notes (Signed)
[name] had his bone marrow biopsy done yesterday. I am grateful for the incredibly  rapid care by radiology. AsMorley Kos,, they did a great job.  He's still having some pain with the spleen. His spleen is massive.  He is on a Duragesic patch now. Hopefully, this will help with some of the discomfort.  He did get his vaccines yesterday.  His labs today show his white cell count is 4.22. Hemoglobin 8.3. Platelet count 33,000.his creatinine is 1.2.  On his physical exam, his vital signs are stable. He has a low-grade temperature. His blood pressure is 134/73. On his exam,there is no change in the splenomegaly. He is tender over the left abdomen. Lungs are clear. Cardiac exam regular rate and rhythm. Extremities shows no clubbing, cyanosis or edema.  It sounds like surgery will be set up for him on Wednesday.  He will clear needed be transfused prior to surgery. His platelet count will never get 100,000. I think his as long as the platelets have been recently infused, that should provide fairly good hemostasis.  I think that his spleen should come out in 1 specimen. I realize that he will need a laparotomy for this. I just think that we can  Get a better idea as to what is going on with the spleen if we get the spleen out as one specimen.  I very much appreciate the outstanding care that he is getting from everybody up on [place]

## 2015-02-18 NOTE — Progress Notes (Signed)
Patient ID: [name], male   DOB: 05-26-1945, 70 y.o.   MRN: [id number]  [facility]  Subjective: Patient in bed, comfortable.  Objective: Vital signs in last 24 hours: Temp:  [98.4 F (36.9 C)-99.6 F (37.6 C)] 99.6 F (37.6 C) (02/04 0420) Pulse Rate:  [78-91] 91 (02/04 0420) Resp:  [12-18] 18 (02/04 0420) BP: (118-135)/(60-88) 134/73 mmHg (02/04 0420) SpO2:  [93 %-100 %] 94 % (02/04 0420) Last BM Date: 02/14/15  Intake/Output from previous day: 02/03 0701 - 02/04 0700 In: 782 [P.O.:480; I.V.:302] Out: 575 [Urine:575] Intake/Output this shift:    Physical Exam: HEENT - sclerae clear, mucous membranes moist Neck - soft Chest - clear bilaterally Cor - RRR Abdomen - soft without distension; mild tenderness left mid abdomen; palpable splenic tip at mid left costal margin Ext - no edema, non-tender Neuro - alert & oriented, no focal deficits  Lab Results:   Recent Labs  02/17/15 0635 02/18/15 0520  WBC 3.8* 4.2  HGB 8.6* 8.3*  HCT 26.2* 25.4*  PLT 38* 33*   BMET  Recent Labs  02/17/15 0635 02/18/15 0520  NA 140 136  K 4.0 4.1  CL 106 103  CO2 25 24  GLUCOSE 116* 119*  BUN 22* 23*  CREATININE 1.04 1.21  CALCIUM 8.8* 8.4*   PT/INR  Recent Labs  02/17/15 0635  LABPROT 16.8*  INR 1.35   Comprehensive Metabolic Panel:    Component Value Date/Time   NA 136 02/18/2015 0520   NA 140 02/17/2015 0635   NA 141 02/13/2015 0915   NA 135 02/06/2015 0923   NA 133* 02/01/2015 1442   NA 140 01/26/2015 1345   NA 140 11/28/2014 1044   K 4.1 02/18/2015 0520   K 4.0 02/17/2015 0635   K 3.8 02/13/2015 0915   K 3.6 02/06/2015 0923   K 4.1 02/01/2015 1442   K 4.1 01/26/2015 1345   K 2.7* 11/28/2014 1044   CL 103 02/18/2015 0520   CL 106 02/17/2015 0635   CL 103 02/06/2015 0923   CL 103 02/01/2015 1442   CL 104 11/28/2014 1044   CO2 24 02/18/2015 0520   CO2 25 02/17/2015 0635   CO2 20* 02/13/2015 0915   CO2 25  02/06/2015 0923   CO2 23 02/01/2015 1442   CO2 23 01/26/2015 1345   CO2 24 11/28/2014 1044   BUN 23* 02/18/2015 0520   BUN 22* 02/17/2015 0635   BUN 23.0 02/13/2015 0915   BUN 17 02/06/2015 0923   BUN 20 02/01/2015 1442   BUN 21.1 01/26/2015 1345   BUN 14 11/28/2014 1044   CREATININE 1.21 02/18/2015 0520   CREATININE 1.04 02/17/2015 0635   CREATININE 1.2 02/13/2015 0915   CREATININE 1.0 02/06/2015 0923   CREATININE 0.97 02/01/2015 1442   CREATININE 1.0 01/26/2015 1345   CREATININE 0.9 11/28/2014 1044   GLUCOSE 119* 02/18/2015 0520   GLUCOSE 116* 02/17/2015 0635   GLUCOSE 94 02/13/2015 0915   GLUCOSE 99 02/06/2015 0923   GLUCOSE 134* 02/01/2015 1442   GLUCOSE 131 01/26/2015 1345   GLUCOSE 108 11/28/2014 1044   CALCIUM 8.4* 02/18/2015 0520   CALCIUM 8.8* 02/17/2015 0635   CALCIUM 8.8 02/13/2015 0915   CALCIUM 8.7 02/06/2015 0923   CALCIUM 8.3* 02/01/2015 1442   CALCIUM 8.7 01/26/2015 1345   CALCIUM 8.7 11/28/2014 1044   AST 10* 02/17/2015 0635   AST 10* 03/10/2015 1221   AST 8 02/13/2015 0915  AST 17 02/06/2015 0923   AST 8 02/01/2015 1442   AST 8 01/26/2015 1345   AST 14 11/28/2014 1044   ALT 9* 02/17/2015 0635   ALT 9* 02/23/2015 1221   ALT 10 02/13/2015 0915   ALT 14 02/06/2015 0923   ALT 9 02/01/2015 1442   ALT 11 01/26/2015 1345   ALT 17 11/28/2014 1044   ALKPHOS 68 02/17/2015 0635   ALKPHOS 68 02/19/2015 1221   ALKPHOS 67 02/13/2015 0915   ALKPHOS 61 02/06/2015 0923   ALKPHOS 74 02/01/2015 1442   ALKPHOS 76 01/26/2015 1345   ALKPHOS 79 11/28/2014 1044   BILITOT 1.3* 02/17/2015 0635   BILITOT 1.0 03/14/2015 1221   BILITOT 0.57 02/13/2015 0915   BILITOT 1.10 02/06/2015 0923   BILITOT 0.7 02/01/2015 1442   BILITOT 0.54 01/26/2015 1345   BILITOT 0.80 11/28/2014 1044   PROT 7.1 02/17/2015 0635   PROT 7.1 03/14/2015 1221   PROT 7.1 02/13/2015 0915   PROT 7.1 02/06/2015 0923   PROT 7.1 02/01/2015 1442   PROT 7.0 01/26/2015 1345   PROT 7.0 11/28/2014  1044   ALBUMIN 3.8 02/17/2015 0635   ALBUMIN 4.0 02/18/2015 1221   ALBUMIN 3.8 02/13/2015 0915   ALBUMIN 3.7 02/06/2015 0923   ALBUMIN 4.1 02/01/2015 1442   ALBUMIN 3.7 01/26/2015 1345   ALBUMIN 3.6 11/28/2014 1044    Studies/Results: Ct Abdomen Pelvis W Contrast  02/17/2015  CLINICAL DATA:  70 year old male with myelodysplastic syndrome, left side abdominal pain, splenomegaly on ultrasound. Subsequent encounter. EXAM: CT ABDOMEN AND PELVIS WITH CONTRAST TECHNIQUE: Multidetector CT imaging of the abdomen and pelvis was performed using the standard protocol following bolus administration of intravenous contrast. CONTRAST:  123mL OMNIPAQUE IOHEXOL 300 MG/ML  SOLN COMPARISON:  Ultrasound 02/13/2015. CT Abdomen and Pelvis 11/17/2014 and earlier. FINDINGS: Stable lung bases.  No pericardial or pleural effusion. Chronic left ninth rib fracture appears stable. No acute osseous abnormality identified. Trace pelvic free fluid best seen on coronal image 66, nonspecific. Unremarkable urinary bladder. Negative rectum. Severe diverticulosis of the sigmoid colon, but no active inflammation. Diverticulosis continues into the descending colon which is medially displaced due to the severe splenomegaly. No left colon inflammation. Negative transverse colon, right colon, and appendix. Negative terminal ileum. No dilated small bowel. Decompressed stomach. Negative duodenum ; chronic second portion duodenum diverticulum. Severe splenomegaly, significantly progressed since November. Estimated splenic volume 2092 mL (normal splenic volume range 83 - 412 mL). This is compared to an estimated splenic volume of 1100 mL in November. There is subtle abnormal hypo enhancement along the posterior superior pole of the spleen, best seen on series 2, image 14. There is trace perisplenic fluid/stranding along the inferior pole. No abdominal free air. No other abdominal free fluid identified. Surgically absent gallbladder. Liver, pancreas,  adrenal glands (stable chronic left adrenal nodule), and kidneys are stable. Portal venous system remains patent. Sequelae of infrarenal abdominal aortic aneurysm repair with bifurcated endograft. IVC filter in place. The IVC and major pelvic veins appear patent. No lymphadenopathy. IMPRESSION: 1. Severe splenomegaly, nearly doubled in size since November. No evidence of acute splenic rupture, but there is abnormal hypoenhancement along the superior pole of the spleen and trace perisplenic inflammation or fluid which is nonspecific. Developing splenic infarct is a consideration. 2. Trace pelvic free fluid, favor reactive to #1. 3. Otherwise stable abdomen and pelvis. Electronically Signed   By: Genevie Ann M.D.   On: 02/20/2015 14:54   Ct Biopsy  02/17/2015  CLINICAL DATA:  Myelodysplastic syndrome, splenomegaly EXAM: CT GUIDED DEEP ILIAC BONE ASPIRATION AND CORE BIOPSY TECHNIQUE: The procedure, risks (including but not limited to bleeding, infection, organ damage ), benefits, and alternatives were explained to the patient. Questions regarding the procedure were encouraged and answered. The patient understands and consents to the procedure. Patient was placed supine on the CT gantry and limited axial scans through the pelvis were obtained. Appropriate skin entry site was identified. Skin site was marked, prepped with Betadine, draped in usual sterile fashion, and infiltrated locally with 1% lidocaine. Intravenous Fentanyl and Versed were administered as conscious sedation during continuous monitoring of the patient's level of consciousness and physiological / cardiorespiratory status by the radiology RN, with a total moderate sedation time of 10 minutes. Under CT fluoroscopic guidance an 11-gauge Cook trocar bone needle was advanced into the right iliac bone just lateral to the sacroiliac joint. Once needle tip position was confirmed, coaxial core and aspiration samples were obtained. The final sample was obtained  using the guiding needle itself, which was then removed. Post procedure scans show no hematoma or fracture. Patient tolerated procedure well. COMPLICATIONS: COMPLICATIONS none IMPRESSION: 1. Technically successful CT guided right iliac bone core and aspiration biopsy. Electronically Signed   By: Lucrezia Europe M.D.   On: 02/17/2015 11:50    Assessment & Plans: Myelodysplastic syndrome with hypersplenism, thrombocytopenia (33K this AM)  Planning for splenectomy this week - tentatively Tuesday  Will need to discuss with hematology - strategy for thrombocytopenia immediately pre-op - transfusion vs steroids vs IVIG?  Will need blood bank support for PRBC, platelets available  Dr. Alphonsa Overall will be our physician this coming week and will make final decision regarding timing of surgery  Discussed with patient who understands and agrees to proceed  Earnstine Regal, MD, [facility] Office: [place] 02/18/2015

## 2015-02-18 NOTE — Progress Notes (Signed)
Patient Demographics  [name], is a 70 y.o. male, DOB - 07/08/1945, VEH:[id number]  Admit date - 02/15/2015   Admitting Physician Darrell Falcon, MD  Outpatient Primary MD for the patient is Darrell Frees, MD  LOS - 2   Chief Complaint  Patient presents with  . Abdominal Pain       Admission HPI/Brief narrative: 70yo man with PMH of MDS, anemia, CAD, HTN, AAA with repair, GERD, Depression who presents with left flank and upper quadrant pain, known history of MDS, followed by [name], treated with supportive transfusion, workup significant for splenomegaly, admitted for pain control , seen by oncology service, had CT guided bone marrow biopsy 02/17/2015, seen by surgery for evaluation for splenectomy, which will be performed this Wednesday.  Subjective:   [name] today has, No headache, No chest pain,No Cough - SOB, complaints of abdominal pain .  Assessment & Plan    Active Problems:   Essential hypertension   Coronary atherosclerosis of native coronary artery   Pancytopenia (HCC)   MDS (myelodysplastic syndrome), high grade (HCC)   Abdominal pain   Splenomegaly   Absolute anemia  Splenomegaly and Abdominal pain - Patient with known history of MDS, oncology consulted, unclear if  his splenomegaly  is related to his MDS. - Abdominal pain is controlled on current regimen. - received pneumococcal/meningococcal/Haemophilus vaccine - Seen by surgery, planned for splenectomy this Wednesday.    MDS (myelodysplastic syndrome), high grade with pancytopenia - Oncology consult appreciated, managed with transfusion support. - Received 2 units PRBC 2/3 given hemoglobin of 6.8, hemoglobin remained stable posttransfusion. - No active bleeding, no indication to transfuse platelet, but will certainly need platelet transfusion preoperatively. - CT-guided bone marrow biopsy 02/17/2015 by IR,  results pending   Coronary atherosclerosis of native coronary artery - Only home med appears to be PRN nitro, this was continued  GERD - Continue protonix, sucralfate, donnatal.   History of DVT - Status post IVC filter, no anticoagulation secondary to thrombocytopenia, encourage to ambulate.  Code Status: Full  Family Communication: none at bedside  Disposition Plan: pending further work up.   Procedures  Bone marrow CT-guided biopsy 02/17/2015 by IR   2 units PRBC transfusion 03/11/2015   Consults   Oncology Gen. surgery   Medications  Scheduled Meds: . sodium chloride   Intravenous Once  . fentaNYL  25 mcg Transdermal Q72H  . multivitamin  1 tablet Oral BID  . pantoprazole  40 mg Oral BID  . polyethylene glycol  17 g Oral BID  . senna-docusate  2 tablet Oral BID  . simvastatin  20 mg Oral QHS   Continuous Infusions: . sodium chloride     PRN Meds:.alum & mag hydroxide-simeth, belladona alk-PHENObarbital, bisacodyl, diphenhydrAMINE, HYDROmorphone (DILAUDID) injection, HYDROmorphone (DILAUDID) injection, magnesium hydroxide, nitroGLYCERIN, ondansetron **OR** ondansetron (ZOFRAN) IV, temazepam  DVT Prophylaxis   SCDs, has IVC filter  Lab Results  Component Value Date   PLT 33* 02/18/2015    Antibiotics    Anti-infectives    None          Objective:   Filed Vitals:   02/17/15 1542 02/17/15 2100 02/17/15 2143 02/18/15 0420  BP: 133/67 132/76  134/73  Pulse: 87 82 88 91  Temp: 99.3 F (37.4 C) 98.4 F (36.9 C)  99.6 F (37.6 C)  TempSrc: Oral Oral  Oral  Resp: '18 16  18  '$ Height:      Weight:      SpO2: 97% 93%  94%    Wt Readings from Last 3 Encounters:  02/22/2015 74 kg (163 lb 2.3 oz)  02/01/15 75.297 kg (166 lb)  01/25/15 75.206 kg (165 lb 12.8 oz)     Intake/Output Summary (Last 24 hours) at 02/18/15 1133 Last data filed at 02/18/15 0520  Gross per 24 hour  Intake    782 ml  Output    575 ml  Net    207 ml     Physical  Exam  Awake Alert, Oriented X 3, No new F.N deficits, Normal affect .AT,PERRAL Supple Neck,No JVD Symmetrical Chest wall movement, Good air movement bilaterally,  RRR,No Gallops,Rubs or new Murmurs, No Parasternal Heave +ve B.Sounds,palpable spleen, diffuse tenderness with guarding . No Cyanosis, Clubbing or edema, No new Rash or bruise    Data Review   Micro Results No results found for this or any previous visit (from the past 240 hour(s)).  Radiology Reports Ct Abdomen Pelvis W Contrast  02/18/2015  CLINICAL DATA:  70 year old male with myelodysplastic syndrome, left side abdominal pain, splenomegaly on ultrasound. Subsequent encounter. EXAM: CT ABDOMEN AND PELVIS WITH CONTRAST TECHNIQUE: Multidetector CT imaging of the abdomen and pelvis was performed using the standard protocol following bolus administration of intravenous contrast. CONTRAST:  187m OMNIPAQUE IOHEXOL 300 MG/ML  SOLN COMPARISON:  Ultrasound 02/13/2015. CT Abdomen and Pelvis 11/17/2014 and earlier. FINDINGS: Stable lung bases.  No pericardial or pleural effusion. Chronic left ninth rib fracture appears stable. No acute osseous abnormality identified. Trace pelvic free fluid best seen on coronal image 66, nonspecific. Unremarkable urinary bladder. Negative rectum. Severe diverticulosis of the sigmoid colon, but no active inflammation. Diverticulosis continues into the descending colon which is medially displaced due to the severe splenomegaly. No left colon inflammation. Negative transverse colon, right colon, and appendix. Negative terminal ileum. No dilated small bowel. Decompressed stomach. Negative duodenum ; chronic second portion duodenum diverticulum. Severe splenomegaly, significantly progressed since November. Estimated splenic volume 2092 mL (normal splenic volume range 83 - 412 mL). This is compared to an estimated splenic volume of 1100 mL in November. There is subtle abnormal hypo enhancement along the posterior  superior pole of the spleen, best seen on series 2, image 14. There is trace perisplenic fluid/stranding along the inferior pole. No abdominal free air. No other abdominal free fluid identified. Surgically absent gallbladder. Liver, pancreas, adrenal glands (stable chronic left adrenal nodule), and kidneys are stable. Portal venous system remains patent. Sequelae of infrarenal abdominal aortic aneurysm repair with bifurcated endograft. IVC filter in place. The IVC and major pelvic veins appear patent. No lymphadenopathy. IMPRESSION: 1. Severe splenomegaly, nearly doubled in size since November. No evidence of acute splenic rupture, but there is abnormal hypoenhancement along the superior pole of the spleen and trace perisplenic inflammation or fluid which is nonspecific. Developing splenic infarct is a consideration. 2. Trace pelvic free fluid, favor reactive to #1. 3. Otherwise stable abdomen and pelvis. Electronically Signed   By: HGenevie AnnM.D.   On: 02/28/2015 14:54   [place]domen Limited  02/13/2015  CLINICAL DATA:  Left upper quadrant pain x1 week, splenomegaly EXAM: LIMITED ABDOMINAL ULTRASOUND COMPARISON:  CT abdomen pelvis dated 11/17/2014 FINDINGS: Spleen is enlarged, measuring 18.6 x 18.1 x 9.6 cm (calculated volume 1691  mL). IMPRESSION: Severe splenomegaly, measuring up to 18.6 cm in maximal dimension (calculated volume 1691 mL). Electronically Signed   By: Julian Hy M.D.   On: 02/13/2015 11:22   Ct Biopsy  02/17/2015  CLINICAL DATA:  Myelodysplastic syndrome, splenomegaly EXAM: CT GUIDED DEEP ILIAC BONE ASPIRATION AND CORE BIOPSY TECHNIQUE: The procedure, risks (including but not limited to bleeding, infection, organ damage ), benefits, and alternatives were explained to the patient. Questions regarding the procedure were encouraged and answered. The patient understands and consents to the procedure. Patient was placed supine on the CT gantry and limited axial scans through the pelvis were  obtained. Appropriate skin entry site was identified. Skin site was marked, prepped with Betadine, draped in usual sterile fashion, and infiltrated locally with 1% lidocaine. Intravenous Fentanyl and Versed were administered as conscious sedation during continuous monitoring of the patient's level of consciousness and physiological / cardiorespiratory status by the radiology RN, with a total moderate sedation time of 10 minutes. Under CT fluoroscopic guidance an 11-gauge Cook trocar bone needle was advanced into the right iliac bone just lateral to the sacroiliac joint. Once needle tip position was confirmed, coaxial core and aspiration samples were obtained. The final sample was obtained using the guiding needle itself, which was then removed. Post procedure scans show no hematoma or fracture. Patient tolerated procedure well. COMPLICATIONS: COMPLICATIONS none IMPRESSION: 1. Technically successful CT guided right iliac bone core and aspiration biopsy. Electronically Signed   By: Lucrezia Europe M.D.   On: 02/17/2015 11:50     CBC  Recent Labs Lab 02/13/15 0914 03/10/2015 1221 03/11/2015 1945 02/17/15 0635 02/18/15 0520  WBC 3.1* 3.4* 3.4* 3.8* 4.2  HGB 7.4* 7.1* 6.6* 8.6* 8.3*  HCT 23.2* 21.5* 20.3* 26.2* 25.4*  PLT 23* 37* 36* 38* 33*  MCV 85 81.7 81.9 84.8 82.7  MCH 27.0* 27.0 26.6 27.8 27.0  MCHC 31.9* 33.0 32.5 32.8 32.7  RDW 17.2* 17.6* 17.5* 16.5* 16.6*  LYMPHSABS  --  0.6*  --   --   --   MONOABS  --  1.0  --   --   --   EOSABS  --  0.0  --   --   --   BASOSABS  --  0.0  --   --   --     Chemistries   Recent Labs Lab 02/13/15 0915 02/27/2015 1221 02/17/15 0635 02/18/15 0520  NA 141 142 140 136  K 3.8 3.8 4.0 4.1  CL  --  107 106 103  CO2 20* '24 25 24  '$ GLUCOSE 94 116* 116* 119*  BUN 23.0 25* 22* 23*  CREATININE 1.2 1.15 1.04 1.21  CALCIUM 8.8 9.0 8.8* 8.4*  AST 8 10* 10*  --   ALT 10 9* 9*  --   ALKPHOS 67 68 68  --   BILITOT 0.57 1.0 1.3*  --     ------------------------------------------------------------------------------------------------------------------ estimated creatinine clearance is 60.3 mL/min (by C-G formula based on Cr of 1.21). ------------------------------------------------------------------------------------------------------------------ No results for input(s): HGBA1C in the last 72 hours. ------------------------------------------------------------------------------------------------------------------ No results for input(s): CHOL, HDL, LDLCALC, TRIG, CHOLHDL, LDLDIRECT in the last 72 hours. ------------------------------------------------------------------------------------------------------------------ No results for input(s): TSH, T4TOTAL, T3FREE, THYROIDAB in the last 72 hours.  Invalid input(s): FREET3 ------------------------------------------------------------------------------------------------------------------ No results for input(s): VITAMINB12, FOLATE, FERRITIN, TIBC, IRON, RETICCTPCT in the last 72 hours.  Coagulation profile  Recent Labs Lab 02/17/15 0635  INR 1.35    No results for input(s): DDIMER in the last 72 hours.  Cardiac Enzymes No results for input(s): CKMB, TROPONINI, MYOGLOBIN in the last 168 hours.  Invalid input(s): CK ------------------------------------------------------------------------------------------------------------------ Invalid input(s): POCBNP     Time Spent in minutes   25 minutes   [place] M.D on 02/18/2015 at 11:33 AM  Between 7am to 7pm - Pager - [phone]  After 7pm go to [email or web address] - password [email or web address]  [facility]   Office  [phone]

## 2015-02-19 DIAGNOSIS — B370 Candidal stomatitis: Secondary | ICD-10-CM

## 2015-02-19 DIAGNOSIS — R1310 Dysphagia, unspecified: Secondary | ICD-10-CM

## 2015-02-19 LAB — CBC WITH DIFFERENTIAL/PLATELET
BLASTS: 12 %
Basophils Absolute: 0 10*3/uL (ref 0.0–0.1)
Basophils Relative: 0 %
EOS ABS: 0 10*3/uL (ref 0.0–0.7)
Eosinophils Relative: 0 %
HCT: 22.4 % — ABNORMAL LOW (ref 39.0–52.0)
Hemoglobin: 7.2 g/dL — ABNORMAL LOW (ref 13.0–17.0)
LYMPHS PCT: 30 %
Lymphs Abs: 1.1 10*3/uL (ref 0.7–4.0)
MCH: 27.4 pg (ref 26.0–34.0)
MCHC: 32.1 g/dL (ref 30.0–36.0)
MCV: 85.2 fL (ref 78.0–100.0)
MONO ABS: 1.1 10*3/uL — AB (ref 0.1–1.0)
MONOS PCT: 31 %
NEUTROS PCT: 27 %
Neutro Abs: 0.9 10*3/uL — ABNORMAL LOW (ref 1.7–7.7)
PLATELETS: 27 10*3/uL — AB (ref 150–400)
RBC: 2.63 MIL/uL — AB (ref 4.22–5.81)
RDW: 17 % — AB (ref 11.5–15.5)
WBC: 3.5 10*3/uL — AB (ref 4.0–10.5)

## 2015-02-19 MED ORDER — FLUCONAZOLE 100 MG PO TABS
200.0000 mg | ORAL_TABLET | Freq: Every day | ORAL | Status: DC
  Administered 2015-02-19 – 2015-02-21 (×3): 200 mg via ORAL
  Filled 2015-02-19 (×3): qty 2

## 2015-02-19 MED ORDER — GI COCKTAIL ~~LOC~~
30.0000 mL | Freq: Three times a day (TID) | ORAL | Status: DC
  Administered 2015-02-19 – 2015-02-20 (×2): 30 mL via ORAL
  Filled 2015-02-19 (×3): qty 30

## 2015-02-19 MED ORDER — MAGIC MOUTHWASH
5.0000 mL | Freq: Four times a day (QID) | ORAL | Status: DC
  Administered 2015-02-19 – 2015-02-22 (×11): 5 mL via ORAL
  Filled 2015-02-19 (×16): qty 5

## 2015-02-19 MED ORDER — SODIUM CHLORIDE 0.9 % IV SOLN
Freq: Once | INTRAVENOUS | Status: AC
  Administered 2015-02-19: 09:00:00 via INTRAVENOUS

## 2015-02-19 MED ORDER — ENSURE ENLIVE PO LIQD
237.0000 mL | Freq: Two times a day (BID) | ORAL | Status: DC
  Administered 2015-02-19 – 2015-02-21 (×2): 237 mL via ORAL

## 2015-02-19 NOTE — Progress Notes (Signed)
Patient Demographics  [name], is a 70 y.o. male, DOB - 11-01-1945, OIT:[id number]  Admit date - 03/08/2015   Admitting Physician Sid Falcon, MD  Outpatient Primary MD for the patient is Shirline Frees, MD  LOS - 3   Chief Complaint  Patient presents with  . Abdominal Pain       Admission HPI/Brief narrative: 70yo man with PMH of MDS, anemia, CAD, HTN, AAA with repair, GERD, Depression who presents with left flank and upper quadrant pain, known history of MDS, followed by Dr. Anderson Malta, treated with supportive transfusion, workup significant for splenomegaly, admitted for pain control , seen by oncology service, had CT guided bone marrow biopsy 02/17/2015, seen by surgery for evaluation for splenectomy, which will be performed this Wednesday.  Subjective:   Courtney Paris today has, No headache, No chest pain,No Cough - SOB, abdominal pain much better controlled on current regimen  Assessment & Plan    Active Problems:   Essential hypertension   Coronary atherosclerosis of native coronary artery   Pancytopenia (HCC)   MDS (myelodysplastic syndrome), high grade (HCC)   Abdominal pain   Splenomegaly   Absolute anemia  Splenomegaly and Abdominal pain - Patient with known history of MDS, oncology consulted, unclear if  his splenomegaly  is related to his MDS. - Abdominal pain is controlled on current regimen. - received pneumococcal/meningococcal/Haemophilus vaccine - Seen by surgery, planned for splenectomy this Wednesday.    MDS (myelodysplastic syndrome), high grade with pancytopenia - Oncology consult appreciated, managed with transfusion support. - Received 2 units PRBC 2/3 given hemoglobin of 6.8, we'll transfuse another unit today as hemoglobin is 7.2. - No active bleeding, no indication to transfuse platelet, but will certainly need platelet transfusion preoperatively. -  CT-guided bone marrow biopsy 02/17/2015 by IR, results pending   Coronary atherosclerosis of native coronary artery - Only home med appears to be PRN nitro, this was continued  GERD - Continue protonix, sucralfate, donnatal.   Oral thrush - Started on Diflucan and Magic mouthwash  History of DVT - Status post IVC filter, no anticoagulation secondary to thrombocytopenia, encouraged to ambulate.  Code Status: Full  Family Communication: none at bedside  Disposition Plan: pending further work up.   Procedures  Bone marrow CT-guided biopsy 02/17/2015 by IR   2 units PRBC transfusion 02/15/2015  1 unit PRBC 02/19/2015  Consults   Oncology Gen. surgery   Medications  Scheduled Meds: . sodium chloride   Intravenous Once  . feeding supplement (ENSURE ENLIVE)  237 mL Oral BID BM  . fentaNYL  25 mcg Transdermal Q72H  . fluconazole  200 mg Oral Daily  . fluticasone  2 spray Each Nare Daily  . gi cocktail  30 mL Oral TID AC  . loratadine  10 mg Oral Daily  . magic mouthwash  5 mL Oral QID  . multivitamin  1 tablet Oral BID  . pantoprazole  40 mg Oral BID  . polyethylene glycol  17 g Oral BID  . senna-docusate  2 tablet Oral BID  . simvastatin  20 mg Oral QHS   Continuous Infusions: . sodium chloride 50 mL/hr at 02/18/15 1758   PRN Meds:.alum & mag hydroxide-simeth, belladona alk-PHENObarbital, bisacodyl, diphenhydrAMINE, HYDROmorphone (DILAUDID) injection, HYDROmorphone (  DILAUDID) injection, magnesium hydroxide, nitroGLYCERIN, ondansetron **OR** ondansetron (ZOFRAN) IV, temazepam  DVT Prophylaxis   SCDs, has IVC filter  Lab Results  Component Value Date   PLT 27* 02/19/2015    Antibiotics    Anti-infectives    Start     Dose/Rate Route Frequency Ordered Stop   02/19/15 1000  fluconazole (DIFLUCAN) tablet 200 mg     200 mg Oral Daily 02/19/15 0850 02/23/15 0959          Objective:   Filed Vitals:   02/18/15 0420 02/18/15 1404 02/18/15 2211 02/19/15 0428  BP:  134/73 129/82 116/64 105/56  Pulse: 91 88 71 83  Temp: 99.6 F (37.6 C) 98.9 F (37.2 C) 98.5 F (36.9 C) 99.7 F (37.6 C)  TempSrc: Oral Oral Oral Oral  Resp: '18 18 18 16  '$ Height:      Weight:      SpO2: 94% 96% 97% 92%    Wt Readings from Last 3 Encounters:  02/22/2015 74 kg (163 lb 2.3 oz)  02/01/15 75.297 kg (166 lb)  01/25/15 75.206 kg (165 lb 12.8 oz)     Intake/Output Summary (Last 24 hours) at 02/19/15 1142 Last data filed at 02/19/15 0356  Gross per 24 hour  Intake    240 ml  Output   1050 ml  Net   -810 ml     Physical Exam  Awake Alert, Oriented X 3, No new F.N deficits, Normal affect [place].AT,PERRAL Supple Neck,No JVD Symmetrical Chest wall movement, Good air movement bilaterally,  RRR,No Gallops,Rubs or new Murmurs, No Parasternal Heave +ve B.Sounds,palpable spleen, diffuse tenderness with guarding . No Cyanosis, Clubbing or edema, No new Rash or bruise    Data Review   Micro Results No results found for this or any previous visit (from the past 240 hour(s)).  Radiology Reports Ct Abdomen Pelvis W Contrast  02/17/2015  CLINICAL DATA:  70 year old male with myelodysplastic syndrome, left side abdominal pain, splenomegaly on ultrasound. Subsequent encounter. EXAM: CT ABDOMEN AND PELVIS WITH CONTRAST TECHNIQUE: Multidetector CT imaging of the abdomen and pelvis was performed using the standard protocol following bolus administration of intravenous contrast. CONTRAST:  182m OMNIPAQUE IOHEXOL 300 MG/ML  SOLN COMPARISON:  Ultrasound 02/13/2015. CT Abdomen and Pelvis 11/17/2014 and earlier. FINDINGS: Stable lung bases.  No pericardial or pleural effusion. Chronic left ninth rib fracture appears stable. No acute osseous abnormality identified. Trace pelvic free fluid best seen on coronal image 66, nonspecific. Unremarkable urinary bladder. Negative rectum. Severe diverticulosis of the sigmoid colon, but no active inflammation. Diverticulosis continues into the  descending colon which is medially displaced due to the severe splenomegaly. No left colon inflammation. Negative transverse colon, right colon, and appendix. Negative terminal ileum. No dilated small bowel. Decompressed stomach. Negative duodenum ; chronic second portion duodenum diverticulum. Severe splenomegaly, significantly progressed since November. Estimated splenic volume 2092 mL (normal splenic volume range 83 - 412 mL). This is compared to an estimated splenic volume of 1100 mL in November. There is subtle abnormal hypo enhancement along the posterior superior pole of the spleen, best seen on series 2, image 14. There is trace perisplenic fluid/stranding along the inferior pole. No abdominal free air. No other abdominal free fluid identified. Surgically absent gallbladder. Liver, pancreas, adrenal glands (stable chronic left adrenal nodule), and kidneys are stable. Portal venous system remains patent. Sequelae of infrarenal abdominal aortic aneurysm repair with bifurcated endograft. IVC filter in place. The IVC and major pelvic veins appear patent. No lymphadenopathy. IMPRESSION: 1.  Severe splenomegaly, nearly doubled in size since November. No evidence of acute splenic rupture, but there is abnormal hypoenhancement along the superior pole of the spleen and trace perisplenic inflammation or fluid which is nonspecific. Developing splenic infarct is a consideration. 2. Trace pelvic free fluid, favor reactive to #1. 3. Otherwise stable abdomen and pelvis. Electronically Signed   By: Genevie Ann M.D.   On: 03/06/2015 14:54   US Abdomen Limited  02/13/2015  CLINICAL DATA:  Left upper quadrant pain x1 week, splenomegaly EXAM: LIMITED ABDOMINAL ULTRASOUND COMPARISON:  CT abdomen pelvis dated 11/17/2014 FINDINGS: Spleen is enlarged, measuring 18.6 x 18.1 x 9.6 cm (calculated volume 1691 mL). IMPRESSION: Severe splenomegaly, measuring up to 18.6 cm in maximal dimension (calculated volume 1691 mL). Electronically  Signed   By: Julian Hy M.D.   On: 02/13/2015 11:22   Ct Biopsy  02/17/2015  CLINICAL DATA:  Myelodysplastic syndrome, splenomegaly EXAM: CT GUIDED DEEP ILIAC BONE ASPIRATION AND CORE BIOPSY TECHNIQUE: The procedure, risks (including but not limited to bleeding, infection, organ damage ), benefits, and alternatives were explained to the patient. Questions regarding the procedure were encouraged and answered. The patient understands and consents to the procedure. Patient was placed supine on the CT gantry and limited axial scans through the pelvis were obtained. Appropriate skin entry site was identified. Skin site was marked, prepped with Betadine, draped in usual sterile fashion, and infiltrated locally with 1% lidocaine. Intravenous Fentanyl and Versed were administered as conscious sedation during continuous monitoring of the patient's level of consciousness and physiological / cardiorespiratory status by the radiology RN, with a total moderate sedation time of 10 minutes. Under CT fluoroscopic guidance an 11-gauge Cook trocar bone needle was advanced into the right iliac bone just lateral to the sacroiliac joint. Once needle tip position was confirmed, coaxial core and aspiration samples were obtained. The final sample was obtained using the guiding needle itself, which was then removed. Post procedure scans show no hematoma or fracture. Patient tolerated procedure well. COMPLICATIONS: COMPLICATIONS none IMPRESSION: 1. Technically successful CT guided right iliac bone core and aspiration biopsy. Electronically Signed   By: Lucrezia Europe M.D.   On: 02/17/2015 11:50     CBC  Recent Labs Lab 03/08/2015 1221 03/10/2015 1945 02/17/15 0635 02/18/15 0520 02/19/15 0540  WBC 3.4* 3.4* 3.8* 4.2 3.5*  HGB 7.1* 6.6* 8.6* 8.3* 7.2*  HCT 21.5* 20.3* 26.2* 25.4* 22.4*  PLT 37* 36* 38* 33* 27*  MCV 81.7 81.9 84.8 82.7 85.2  MCH 27.0 26.6 27.8 27.0 27.4  MCHC 33.0 32.5 32.8 32.7 32.1  RDW 17.6* 17.5* 16.5*  16.6* 17.0*  LYMPHSABS 0.6*  --   --   --  1.1  MONOABS 1.0  --   --   --  1.1*  EOSABS 0.0  --   --   --  0.0  BASOSABS 0.0  --   --   --  0.0    Chemistries   Recent Labs Lab 02/13/15 0915 03/12/2015 1221 02/17/15 0635 02/18/15 0520  NA 141 142 140 136  K 3.8 3.8 4.0 4.1  CL  --  107 106 103  CO2 20* '24 25 24  '$ GLUCOSE 94 116* 116* 119*  BUN 23.0 25* 22* 23*  CREATININE 1.2 1.15 1.04 1.21  CALCIUM 8.8 9.0 8.8* 8.4*  AST 8 10* 10*  --   ALT 10 9* 9*  --   ALKPHOS 67 68 68  --   BILITOT 0.57 1.0 1.3*  --    ------------------------------------------------------------------------------------------------------------------  estimated creatinine clearance is 60.3 mL/min (by C-G formula based on Cr of 1.21). ------------------------------------------------------------------------------------------------------------------ No results for input(s): HGBA1C in the last 72 hours. ------------------------------------------------------------------------------------------------------------------ No results for input(s): CHOL, HDL, LDLCALC, TRIG, CHOLHDL, LDLDIRECT in the last 72 hours. ------------------------------------------------------------------------------------------------------------------ No results for input(s): TSH, T4TOTAL, T3FREE, THYROIDAB in the last 72 hours.  Invalid input(s): FREET3 ------------------------------------------------------------------------------------------------------------------ No results for input(s): VITAMINB12, FOLATE, FERRITIN, TIBC, IRON, RETICCTPCT in the last 72 hours.  Coagulation profile  Recent Labs Lab 02/17/15 0635  INR 1.35    No results for input(s): DDIMER in the last 72 hours.  Cardiac Enzymes No results for input(s): CKMB, TROPONINI, MYOGLOBIN in the last 168 hours.  Invalid input(s): CK ------------------------------------------------------------------------------------------------------------------ Invalid input(s):  POCBNP     Time Spent in minutes   25 minutes   Nikkita Adeyemi M.D on 02/19/2015 at 11:42 AM  Between 7am to 7pm - Pager - [phone]  After 7pm go to [email or web address] - password [facility]  [facility]   Office  [phone]

## 2015-02-19 NOTE — Progress Notes (Signed)
Mr. Zappone is complaining of dysphagia and odynophagia. He may have a little bit of thrush. I will go ahead and put him on some Diflucan. I will also give him some viscous lidocaine.  His pain seems be doing pretty well. He is on a Duragesic patch now.  He's had no bleeding. He's had no fever.  Hopefully, the bone marrow tests will come back tomorrow.  His labs shows white cell count to be 3.5. Hemoglobin is 7.2. Platelet count 27,000.  On his physical exam, his vital signs are stable. His temperature is 99.7. Pulse 83. Blood pressure is 105/56. Oral exam does show some thrush on his tongue. He does have some erythema in his pharynx. His lungs are clear. Cardiac exam regular rate and rhythm. His 1/6 systolic ejection murmur. Abdomen is slightly distended. Bowel sounds are active. He has some tenderness in the left upper quadrant. There is splenomegaly. Extremities shows no clubbing, cyanosis or edema.  It is not present as blood counts are dropping. He'll be very interesting to see what the bone marrow biopsy shows.  Hopefully, the Diflucan and viscous lidocaine will help with the odynophagia. He really needs to get his nutritional intake better if he could have surgery.  Again, I'm just grateful for the outstanding care that he is gotten from everybody up on [place] 4:4

## 2015-02-20 ENCOUNTER — Encounter (HOSPITAL_COMMUNITY): Payer: Self-pay | Admitting: Physician Assistant

## 2015-02-20 ENCOUNTER — Encounter: Payer: Self-pay | Admitting: Hematology & Oncology

## 2015-02-20 ENCOUNTER — Other Ambulatory Visit: Payer: Medicare Other

## 2015-02-20 DIAGNOSIS — F419 Anxiety disorder, unspecified: Secondary | ICD-10-CM

## 2015-02-20 DIAGNOSIS — R112 Nausea with vomiting, unspecified: Secondary | ICD-10-CM

## 2015-02-20 DIAGNOSIS — Z01810 Encounter for preprocedural cardiovascular examination: Secondary | ICD-10-CM

## 2015-02-20 LAB — CBC WITH DIFFERENTIAL/PLATELET
BASOS ABS: 0 10*3/uL (ref 0.0–0.1)
BASOS PCT: 0 %
EOS ABS: 0 10*3/uL (ref 0.0–0.7)
Eosinophils Relative: 0 %
HCT: 23.9 % — ABNORMAL LOW (ref 39.0–52.0)
Hemoglobin: 7.9 g/dL — ABNORMAL LOW (ref 13.0–17.0)
LYMPHS PCT: 39 %
Lymphs Abs: 1.4 10*3/uL (ref 0.7–4.0)
MCH: 27.4 pg (ref 26.0–34.0)
MCHC: 33.1 g/dL (ref 30.0–36.0)
MCV: 83 fL (ref 78.0–100.0)
Monocytes Absolute: 1.2 10*3/uL — ABNORMAL HIGH (ref 0.1–1.0)
Monocytes Relative: 32 %
NEUTROS PCT: 29 %
Neutro Abs: 1 10*3/uL — ABNORMAL LOW (ref 1.7–7.7)
PLATELETS: 19 10*3/uL — AB (ref 150–400)
RBC: 2.88 MIL/uL — AB (ref 4.22–5.81)
RDW: 17 % — ABNORMAL HIGH (ref 11.5–15.5)
WBC: 3.6 10*3/uL — ABNORMAL LOW (ref 4.0–10.5)

## 2015-02-20 LAB — TYPE AND SCREEN
ABO/RH(D): A POS
ANTIBODY SCREEN: NEGATIVE
UNIT DIVISION: 0
UNIT DIVISION: 0
Unit division: 0
Unit division: 0

## 2015-02-20 LAB — BASIC METABOLIC PANEL
ANION GAP: 10 (ref 5–15)
BUN: 27 mg/dL — ABNORMAL HIGH (ref 6–20)
CO2: 26 mmol/L (ref 22–32)
Calcium: 8.5 mg/dL — ABNORMAL LOW (ref 8.9–10.3)
Chloride: 106 mmol/L (ref 101–111)
Creatinine, Ser: 1.23 mg/dL (ref 0.61–1.24)
GFR, EST NON AFRICAN AMERICAN: 59 mL/min — AB (ref >60)
Glucose, Bld: 128 mg/dL — ABNORMAL HIGH (ref 65–99)
POTASSIUM: 4.1 mmol/L (ref 3.5–5.1)
SODIUM: 142 mmol/L (ref 135–145)

## 2015-02-20 MED ORDER — PB-HYOSCY-ATROPINE-SCOPOLAMINE 16.2 MG PO TABS
1.0000 | ORAL_TABLET | Freq: Three times a day (TID) | ORAL | Status: DC
  Filled 2015-02-20 (×3): qty 1

## 2015-02-20 MED ORDER — PB-HYOSCY-ATROPINE-SCOPOLAMINE 16.2 MG/5ML PO ELIX
5.0000 mL | ORAL_SOLUTION | Freq: Three times a day (TID) | ORAL | Status: DC
  Administered 2015-02-20 – 2015-02-28 (×16): 16.2 mg via ORAL
  Filled 2015-02-20 (×33): qty 5

## 2015-02-20 MED ORDER — LORAZEPAM 2 MG/ML IJ SOLN
1.0000 mg | Freq: Once | INTRAMUSCULAR | Status: AC
  Administered 2015-02-20: 1 mg via INTRAVENOUS
  Filled 2015-02-20: qty 1

## 2015-02-20 MED ORDER — SODIUM CHLORIDE 0.9 % IV SOLN
8.0000 mg | Freq: Once | INTRAVENOUS | Status: AC
  Administered 2015-02-20: 8 mg via INTRAVENOUS
  Filled 2015-02-20: qty 4

## 2015-02-20 NOTE — Care Management Important Message (Signed)
Important Message  Patient Details  Name: DRAVIN SCHELLIN MRN: [id number] Date of Birth: 05-12-1945   Medicare Important Message Given:  Yes    Camillo Flaming 02/20/2015, 9:08 AMImportant Message  Patient Details  Name: HERLEY COMTE MRN: [id number] Date of Birth: [birth year]   Medicare Important Message Given:  Yes    Camillo Flaming 02/20/2015, 9:08 AM

## 2015-02-20 NOTE — Progress Notes (Signed)
PT Cancellation Note  Patient Details Name: [name] MRN: [id number] DOB: Mar 26, 1945   Cancelled Treatment:    Reason Eval/Treat Not Completed: Patient declined, no reason specified;  Pt  Is sleepy, he awakens to adamantly state "NO"; discussed with RN, will plan to see for eval  after his procedure on Wednesday; He has declined to amb with nursing staff as well, although he does move about in the room per RN   [facility] 02/20/2015, 2:00 PM

## 2015-02-20 NOTE — Progress Notes (Signed)
[facility]:  [phone] General Surgery Progress Note   LOS: 4 days  POD -    PCP - Dr. Tomasita Crumble   Assessment/Plan: 1.  Splenomegaly  Symptomatic.  Plan splenectomy later this week.  Actual date of surgery not set yet, but Wednesday sounds good.  I spent 30 minutes going over the indications and risks of surgery.  The risks include bleeding, infection, stomach/pancreas injury, and continued hemopoietic issues.  I will aim for surgery on Wednesday.  2.  Myelodysplastic syndrome  Bone marrow biopsy pending 3a.  Thrombocytopenia  Plt count - 19,000 - 02/20/2015  Will hold transfusion of platelets to immediately before/during surgery 3b. Anemia  Hgb 7.9 - 02/20/2015  4.  CAD  Inferior MI in 2010.  Sees Dr. Burt Knack  Last Echo - 03/31/2014 - EF - 55%  I spoke with Dr. Waldron [facility] - he will ask cards to see preop 5.  History of VATS 6.  History of endovascular stent for AAA    7.  History of IVC filter for DVT 8.  DVT prophylaxis - on hold for thrombocytopenia 9.  History of "dilaudid withdrawal" when he had his chest surgery.   Active Problems:   Essential hypertension   Coronary atherosclerosis of native coronary artery   Pancytopenia (HCC)   MDS (myelodysplastic syndrome), high grade (HCC)   Abdominal pain   Splenomegaly   Absolute anemia   Subjective:  Deconditioned patient.  Has abdominal pain attributed to spleen.  He is having some nausea today.  Married and has one son.  Objective:   Filed Vitals:   02/19/15 2033 02/20/15 0424  BP: 111/66 117/64  Pulse: 81 88  Temp: 99.3 F (37.4 C) 99.5 F (37.5 C)  Resp: 18 16     Intake/Output from previous day:  02/05 0701 - 02/06 0700 In: 1272.8 [P.O.:922; I.V.:30; Blood:320.8] Out: 725 [Urine:725]  Intake/Output this shift:      Physical Exam:   General: Thin older WM who is alert and oriented.    HEENT: Normal. Pupils equal. .   Lungs: Clear, though inspiratory effort is not great.  His  IS is 1,200 cc   Cards:  3/6 systolic murmur   Abdomen: Spleen dominates the left side of his abdomen. He has some tenderness on the left side also.   Lab Results:    Recent Labs  02/19/15 0540 02/20/15 0500  WBC 3.5* 3.6*  HGB 7.2* 7.9*  HCT 22.4* 23.9*  PLT 27* 19*    BMET   Recent Labs  02/18/15 0520 02/20/15 0500  NA 136 142  K 4.1 4.1  CL 103 106  CO2 24 26  GLUCOSE 119* 128*  BUN 23* 27*  CREATININE 1.21 1.23  CALCIUM 8.4* 8.5*    PT/INR  No results for input(s): LABPROT, INR in the last 72 hours.  ABG  No results for input(s): PHART, HCO3 in the last 72 hours.  Invalid input(s): PCO2, PO2   Studies/Results:  No results found.   Anti-infectives:   Anti-infectives    Start     Dose/Rate Route Frequency Ordered Stop   02/19/15 1000  fluconazole (DIFLUCAN) tablet 200 mg     200 mg Oral Daily 02/19/15 0850 02/23/15 0959      Alphonsa Overall, MD, FACS Pager: [facility] Office: [phone] 02/20/2015

## 2015-02-20 NOTE — Progress Notes (Signed)
[name] is having some nausea and vomiting this morning. Not sure as to why he would have this. He is still having some odynophagia. I started some Diflucan on him yesterday.  The bone marrow biopsy still is not back. It should be back today.  I still think he is set up for splenectomy on Wednesday.  He's had no fever. He's had no obvious bleeding.  His CBC shows white count 3.6. Hemoglobin 7.9. Platelet count 19,000. His creatinine is 1.23. Potassium 4.1.  He is out of bed. He is not having diarrhea.  His vital signs all look stable. Blood pressure is 117/64. Temperature 99.5. Pulse is 88. Abdomen is soft. Bowel sounds are present. He still has a splenomegaly. There is no guarding or rebound tenderness. Lungs are clear. Cardiac exam regular rate and rhythm. Skin exam shows no rashes, ecchymosis or petechia.  I will give a dose of Zofran. Hopefully, adjusting his medications may help some of this vomiting. I'll there is low anxiety associated with this.  I will try some low-dose Ativan on him. This might help a little bit.  [name] 1:5-7

## 2015-02-20 NOTE — Consult Note (Signed)
Cardiology Consultation Note    Patient ID: [name], MRN: [id number], DOB/AGE: 1945-04-24 70 y.o. Admit date: 03/09/2015   Date of Consult: 02/20/2015 Primary Physician: Shirline Frees, MD Primary Cardiologist: Burt Knack  Chief Complaint: abdominal pain Reason for Consultation: pre-op clearance Requesting MD: Dr. Lucia Gaskins  HPI: [name] is a 70 y/o M with history of myelodysplastic syndrome, CAD (inferior MI 2010 s/p PCI of RCA with residual moderate LM/LCx disease), AAA s/p EVAR in 2015, DVT s/p IVC filter 09/2014, recurrent spontaneous left pneumothorax s/p VATS 09/2014, esophageal ulcer/gastritis 10/2014, HTN, HLD, diverticulitis, tobacco abuse, GERD who presented to [facility] for splenectomy. Per review of chart, his residual CAD has been treated medically. Last nuclear stress test 05/2013: "Applying for a inferior wall attenuation artifact, there are no perfusion defects and there is no evidence of stress-induced ischemia." He was going to have a cath in March 2016 but this was deferred due to severe anemia at the time. He is not on BB due to low BP and bradycardia. Aspirin was held in 10/2014 due to gastritis and esophageal ulcer. Last 2D echo 03/2014 normal LV Function, EF 55-60%, no RWMA, biatrial enlargement, mild RVE, mild MR, trace AI. He has required regular transfusions for his MDS.  He was admitted 03/08/2015 with left sided abdominal pain. He has been followed by his oncologist who has been concerned about transformation of his MDS into leukemia. He has since had progressive anemia/thrombocytopenia as well. He underwent bone marrow biopsy on 02/17/15. General surgery was consulted for splenectomy given massive splenomegaly. Most recent labs notable for Hgb 7.9, platelet count 19k, WBC 3.6. The patient just received ativan and Dilaudid and is very sleepy as a result. He consents to further discussion with his wife. She says he has not had any recent chest pain or SOB, just left shoulder pain ever  since he's been diagnosed with spleen issues. However, he is extremely sedentary therefore exertional symptoms are unknown. He has not had any syncope.   Past Medical History  Diagnosis Date  . Coronary atherosclerosis of native coronary artery     a. 10/2008 inf STEMI/PCI: LM 50d (IVUS-borderline lesion->med rx), LAD min irregs, LCX 64m 70d, OM nl, RCA [id number] Vision BMS);  b. 11/2008 Lexiscan MV: EF 65%, no isch/scar.  . Essential hypertension   . Pure hypercholesterolemia   . AAA (abdominal aortic aneurysm) ([facility]    a. s/p EVAR in 2015 by Vascular.  . Diverticulitis     a. 05/2013 CT: descending/sigmoid jxn w/o abscess.  . Osteoarthritis   . Tobacco abuse     a. ongoing - 1ppd for better part of 50 yrs.  . Normocytic anemia   . Cellulitis 06/10/2013    Right antecubital fossa at site of IV  03/12/13  . PONV (postoperative nausea and vomiting)   . Diverticulitis   . GERD (gastroesophageal reflux disease)   . MDS (myelodysplastic syndrome), high grade ([place]  . Myocardial infarction ([facility]  . Depression   . Spontaneous pneumothorax     a. recurrent spontaneous PTX s/p VATS 09/2014.  . Marland Kitchenypotestosteronemia   . Esophageal ulcer 10/2014  . DVT (deep vein thrombosis) in pregnancy     a. 09/2014: left lower extremity popliteal DV s/p IVC filter.  . Gastritis 10/2014  . Sialadenitis   . Protein calorie malnutrition ([facility]      Surgical History:  Past Surgical History  Procedure Laterality Date  . Cardiac stents  2010  .  Cholecystectomy N/A 06/19/2013    Procedure: LAPAROSCOPIC CHOLECYSTECTOMY;  Surgeon: Harl Bowie, MD;  Location: [place];  Service: General;  Laterality: N/A;  . Abdominal aortic endovascular stent graft N/A 07/02/2013    Procedure: ABDOMINAL AORTIC ENDOVASCULAR STENT GRAFT;  Surgeon: Serafina Mitchell, MD;  Location: [facility] OR;  Service: Vascular;  Laterality: N/A;  . Esophagogastroduodenoscopy N/A 08/05/2013    Procedure: ESOPHAGOGASTRODUODENOSCOPY  (EGD);  Surgeon: Lear Ng, MD;  Location[facility] ENDOSCOPY;  Service: Endoscopy;  Laterality: N/A;  . Eye surgery Left     cataract surgery  . Back surgery      cervical fusion  . Colonoscopy with propofol N/A 10/15/2013    Procedure: COLONOSCOPY WITH PROPOFOL;  Surgeon: Lear Ng, MD;  Location: [place];  Service: Endoscopy;  Laterality: N/A;  . Fetal blood transfusion  March 16,17,18, 2016  . Bone marrow biopsy  April 01, 2014  . Chest tube insertion  09/14/2014  . Video assisted thoracoscopy Left 10/11/2014    Procedure: VIDEO ASSISTED THORACOSCOPY;  Surgeon: Ivin Poot, MD;  Location: [place];  Service: Thoracic;  Laterality: Left;  . Stapling of blebs Left 10/11/2014    Procedure: STAPLING OF BLEBS;  Surgeon: Ivin Poot, MD;  Location: [facility] OR;  Service: Thoracic;  Laterality: Left;  . Esophagogastroduodenoscopy N/A 11/04/2014    Procedure: ESOPHAGOGASTRODUODENOSCOPY (EGD);  Surgeon: Laurence Spates, MD;  Location: [facility] END[facility];  Service: Endoscopy;  Laterality: N/A;     Home Meds: Prior to Admission medications   Medication Sig Start Date End Date Taking? Authorizing Provider  acetaminophen (TYLENOL) 500 MG tablet Take 500 mg by mouth every 6 (six) hours as needed for fever.   Yes Historical Provider, MD  belladona alk-PHENObarbital (DONNATAL) 16.2 MG tablet Take 1 tablet by mouth every 8 (eight) hours as needed. 12/23/14  Yes Volanda Napoleon, MD  diphenhydrAMINE (BENADRYL) 25 mg capsule Take 1 capsule (25 mg total) by mouth every 6 (six) hours as needed for itching. May take with Percocet PRN 10/17/14  Yes Donielle Liston Alba, PA-C  doxycycline (VIBRA-TABS) 100 MG tablet Take 1 tablet (100 mg total) by mouth 2 (two) times daily. For 7 days. 02/02/15  Yes Volanda Napoleon, MD  HYDROcodone-acetaminophen (NORCO/VICODIN) 5-325 MG tablet Take 1-2 tablets by mouth every 4 (four) hours as needed for moderate pain.   Yes Historical Provider, MD  lidocaine-prilocaine  (EMLA) cream Apply 1 application topically as needed. Please dispense 2 tubes 10/12/14  Yes Volanda Napoleon, MD  Multiple Vitamins-Minerals (PRESERVISION AREDS 2 PO) Take by mouth 2 (two) times daily.   Yes Historical Provider, MD  nitroGLYCERIN (NITROSTAT) 0.4 MG SL tablet Place 1 tablet (0.4 mg total) under the tongue every 5 (five) minutes as needed for chest pain. 03/29/14  Yes Sherren Mocha, MD  oxyCODONE-acetaminophen (PERCOCET) 7.5-325 MG tablet Take 1-2 tablets by mouth every 4 (four) hours as needed for moderate pain or severe pain. 02/15/15  Yes Volanda Napoleon, MD  pantoprazole (PROTONIX) 40 MG tablet Take 1 tablet (40 mg total) by mouth 2 (two) times daily. 01/31/15  Yes Sherren Mocha, MD  predniSONE (DELTASONE) 20 MG tablet Take 20 mg by mouth as needed (chemo reaction and energy "boost.").    Yes Historical Provider, MD  simvastatin (ZOCOR) 20 MG tablet Take 1 tablet (20 mg total) by mouth at bedtime. 03/30/14  Yes Sherren Mocha, MD  sucralfate (CARAFATE) 1 GM/10ML suspension Take 10 mLs (1 g total) by mouth 4 (four) times  daily -  with meals and at bedtime. Patient taking differently: Take 1 g by mouth 4 (four) times daily -  with meals and at bedtime. Takes as needed 11/08/14  Yes Robbie Lis, MD  temazepam (RESTORIL) 15 MG capsule Take 15 mg by mouth at bedtime as needed for sleep.   Yes Historical Provider, MD  mupirocin ointment (BACTROBAN) 2 % Apply 3 times a day to affected area under RIGHT ear. Patient not taking: Reported on 02/22/2015 02/02/15   Volanda Napoleon, MD    Inpatient Medications:  . sodium chloride   Intravenous Once  . belladonna-PHENObarbital  5 mL Oral TID  . feeding supplement (ENSURE ENLIVE)  237 mL Oral BID BM  . fentaNYL  25 mcg Transdermal Q72H  . fluconazole  200 mg Oral Daily  . fluticasone  2 spray Each Nare Daily  . loratadine  10 mg Oral Daily  . magic mouthwash  5 mL Oral QID  . multivitamin  1 tablet Oral BID  . pantoprazole  40 mg Oral BID  .  polyethylene glycol  17 g Oral BID  . senna-docusate  2 tablet Oral BID  . simvastatin  20 mg Oral QHS   . sodium chloride 50 mL/hr at 02/19/15 1414    Allergies:  Allergies  Allergen Reactions  . Albuterol-Ipratropium-Soybean Lecithin [Ipratropium-Albuterol] Anxiety  . Cephalexin Other (See Comments)    Nausea and stomach cramps  . Compazine [Prochlorperazine] Anxiety  . Percocet [Oxycodone-Acetaminophen] Itching    Can take with benadryl    . Phenergan [Promethazine Hcl] Other (See Comments)    "restless legs"  . Tramadol Other (See Comments)    "jerking limbs and talking in his sleep"  . Vicodin [Hydrocodone-Acetaminophen] Itching    Can take with benadryl   . Marinol [Dronabinol] Other (See Comments)    "Affects his eyes"  . Reglan [Metoclopramide] Other (See Comments)    Patient has significant shakes that are troubling. Would not like to receive this if there are other options available.    Social History   Social History  . Marital Status: Married    Spouse Name: N/A  . Number of Children: N/A  . Years of Education: N/A   Occupational History  . Not on file.   Social History Main Topics  . Smoking status: Former Smoker -- 1.00 packs/day for 60 years    Types: Cigarettes    Start date: 04/05/1964  . Smokeless tobacco: Never Used     Comment: 09/14/2014  . Alcohol Use: No  . Drug Use: No  . Sexual Activity: Not on file   Other Topics Concern  . Not on file   Social History Narrative   Lives in [place] with wife.  Retired Furniture conservator/restorer.  Works out in the yard often - limited by claudication.  Does not routinely exercise.     Family History  Problem Relation Age of Onset  . Heart attack Brother     86s  . Cancer Father     Lung  . Cancer Mother     Brain  . Cancer Brother     Kidney     Review of Systems: limited given patient's sleepiness. See above for pertinent positives.  Labs:  Lab Results  Component Value Date   WBC 3.6* 02/20/2015   HGB  7.9* 02/20/2015   HCT 23.9* 02/20/2015   MCV 83.0 02/20/2015   PLT 19* 02/20/2015    Recent Labs Lab 02/17/15 0635  02/20/15 0500  NA 140  < > 142  K 4.0  < > 4.1  CL 106  < > 106  CO2 25  < > 26  BUN 22*  < > 27*  CREATININE 1.04  < > 1.23  CALCIUM 8.8*  < > 8.5*  PROT 7.1  --   --   BILITOT 1.3*  --   --   ALKPHOS 68  --   --   ALT 9*  --   --   AST 10*  --   --   GLUCOSE 116*  < > 128*  < > = values in this interval not displayed. Lab Results  Component Value Date   CHOL 72* 01/25/2015   HDL 28* 01/25/2015   LDLCALC 22 01/25/2015   TRIG 111 01/25/2015   Radiology/Studies:  Ct Abdomen Pelvis W Contrast  03/14/2015  CLINICAL DATA:  70 year old male with myelodysplastic syndrome, left side abdominal pain, splenomegaly on ultrasound. Subsequent encounter. EXAM: CT ABDOMEN AND PELVIS WITH CONTRAST TECHNIQUE: Multidetector CT imaging of the abdomen and pelvis was performed using the standard protocol following bolus administration of intravenous contrast. CONTRAST:  183m OMNIPAQUE IOHEXOL 300 MG/ML  SOLN COMPARISON:  Ultrasound 02/13/2015. CT Abdomen and Pelvis 11/17/2014 and earlier. FINDINGS: Stable lung bases.  No pericardial or pleural effusion. Chronic left ninth rib fracture appears stable. No acute osseous abnormality identified. Trace pelvic free fluid best seen on coronal image 66, nonspecific. Unremarkable urinary bladder. Negative rectum. Severe diverticulosis of the sigmoid colon, but no active inflammation. Diverticulosis continues into the descending colon which is medially displaced due to the severe splenomegaly. No left colon inflammation. Negative transverse colon, right colon, and appendix. Negative terminal ileum. No dilated small bowel. Decompressed stomach. Negative duodenum ; chronic second portion duodenum diverticulum. Severe splenomegaly, significantly progressed since November. Estimated splenic volume 2092 mL (normal splenic volume range 83 - 412 mL). This is  compared to an estimated splenic volume of 1100 mL in November. There is subtle abnormal hypo enhancement along the posterior superior pole of the spleen, best seen on series 2, image 14. There is trace perisplenic fluid/stranding along the inferior pole. No abdominal free air. No other abdominal free fluid identified. Surgically absent gallbladder. Liver, pancreas, adrenal glands (stable chronic left adrenal nodule), and kidneys are stable. Portal venous system remains patent. Sequelae of infrarenal abdominal aortic aneurysm repair with bifurcated endograft. IVC filter in place. The IVC and major pelvic veins appear patent. No lymphadenopathy. IMPRESSION: 1. Severe splenomegaly, nearly doubled in size since November. No evidence of acute splenic rupture, but there is abnormal hypoenhancement along the superior pole of the spleen and trace perisplenic inflammation or fluid which is nonspecific. Developing splenic infarct is a consideration. 2. Trace pelvic free fluid, favor reactive to #1. 3. Otherwise stable abdomen and pelvis. Electronically Signed   By: HGenevie AnnM.D.   On: 03/03/2015 14:54   UKoreaAbdomen Limited  02/13/2015  CLINICAL DATA:  Left upper quadrant pain x1 week, splenomegaly EXAM: LIMITED ABDOMINAL ULTRASOUND COMPARISON:  CT abdomen pelvis dated 11/17/2014 FINDINGS: Spleen is enlarged, measuring 18.6 x 18.1 x 9.6 cm (calculated volume 1691 mL). IMPRESSION: Severe splenomegaly, measuring up to 18.6 cm in maximal dimension (calculated volume 1691 mL). Electronically Signed   By: SJulian HyM.D.   On: 02/13/2015 11:22   Ct Biopsy  02/17/2015  CLINICAL DATA:  Myelodysplastic syndrome, splenomegaly EXAM: CT GUIDED DEEP ILIAC BONE ASPIRATION AND CORE BIOPSY TECHNIQUE: The procedure, risks (including but not limited to bleeding,  infection, organ damage ), benefits, and alternatives were explained to the patient. Questions regarding the procedure were encouraged and answered. The patient understands  and consents to the procedure. Patient was placed supine on the CT gantry and limited axial scans through the pelvis were obtained. Appropriate skin entry site was identified. Skin site was marked, prepped with Betadine, draped in usual sterile fashion, and infiltrated locally with 1% lidocaine. Intravenous Fentanyl and Versed were administered as conscious sedation during continuous monitoring of the patient's level of consciousness and physiological / cardiorespiratory status by the radiology RN, with a total moderate sedation time of 10 minutes. Under CT fluoroscopic guidance an 11-gauge Cook trocar bone needle was advanced into the right iliac bone just lateral to the sacroiliac joint. Once needle tip position was confirmed, coaxial core and aspiration samples were obtained. The final sample was obtained using the guiding needle itself, which was then removed. Post procedure scans show no hematoma or fracture. Patient tolerated procedure well. COMPLICATIONS: COMPLICATIONS none IMPRESSION: 1. Technically successful CT guided right iliac bone core and aspiration biopsy. Electronically Signed   By: Lucrezia Europe M.D.   On: 02/17/2015 11:50    Wt Readings from Last 3 Encounters:  03/07/2015 163 lb 2.3 oz (74 kg)  02/01/15 166 lb (75.297 kg)  01/25/15 165 lb 12.8 oz (75.206 kg)   EKG: NSR 87bpm, no acute ST-T changes  Physical Exam: Blood pressure 117/64, pulse 88, temperature 99.5 F (37.5 C), temperature source Oral, resp. rate 16, height '5\' 10"'$  (1.778 m), weight 163 lb 2.3 oz (74 kg), SpO2 90 %. Body mass index is 23.41 kg/(m^2). General: Well developed pale WM, in no acute distress. Head: Normocephalic, atraumatic, sclera non-icteric, no xanthomas, nares are without discharge.  Neck: Negative for carotid bruits. JVD not elevated. Lungs: Clear bilaterally to auscultation without wheezes, rales, or rhonchi. Breathing is unlabored. Heart: RRR with S1 S2. Soft systolic ejection murmur. No rubs or gallops  appreciated. Abdomen: Rounded and enlarged with normoactive bowel sounds. Msk:  Strength and tone appear normal for age. Extremities: No clubbing or cyanosis. No edema.  Distal pedal pulses are 2+ and equal bilaterally. Neuro: Sleepy but arousable. Follows commands when awake. Psych:  Sleepy affect.    Assessment and Plan   1. MDS with anemia/thrombocytopenia and splenomegaly - per IM/onc.  2. CAD as above/pre-operative evaluation - no recent anginal symptoms. His residual CAD has been managed medically since 2010. His severe thrombocytopenia and anemia preclude the option of any further invasive workup due to not being able to use antiplatelet therapy even if lesion were found. Additionally, he was taken off aspirin due to gastritis/esophageal ulcer in 10/2014. He is not on beta blocker therapy due to h/o bradycardia and hypotension. Will discuss further evaluation with Dr. Stanford Breed.   3. Essential HTN - currently controlled.  4. H/o DVT 09/2014 - s/p IVC filter. Was unable to be anticoagulated at the time of diagnosis due to risk of bleeding.  Signed, Charlie Pitter PA-C 02/20/2015, 11:17 AM Pager: [phone] As above, patient seen and examined. Briefly he is a 70 year old male with past medical history of myelodysplastic syndrome requiring intermittent transfusions, coronary artery disease, abdominal aortic aneurysm repair, prior DVT status post IVC filter and recurrent left pneumothorax for preoperative evaluation prior to splenomegaly. Patient has had previous PCI of his right coronary artery with residual moderate coronary disease.Last nuclear study May 2015 showed no ischemia.Echocardiogram March 2016 showed normal LV function. At time of evaluation patient is sedated and  somewhat somnolent. He has a very limited functional capacity because of recurrent anemia causing fatigue. He ambulates to his mailbox without dyspnea or chest pain. Patient admitted with abdominal pain and found to have  severe splenomegaly. Plan is for splenectomy and cardiology was asked to evaluate preoperatively. Electrocardiogram shows sinus rhythm with nonspecific ST changes. Long discussion with patient's wife. He has multiple medical issues including chronic myelodysplastic syndrome which may be progressing to leukemia. His platelet count at present is 19,000. He requires intermittent transfusion. I do not think at present he is a candidate for aggressive cardiac evaluation. If he was found to have significant coronary disease he could not undergo intervention because of ongoing myelodysplastic syndrome/thrombocytopenia. He needs to have his splenectomy regardless. Given his overall medical status he will be high risk for any procedure. His wife understands his high risk and is agreeable to proceed. No further workup preoperatively. We will be available as needed. Kirk Ruths

## 2015-02-20 NOTE — Progress Notes (Addendum)
Patient Demographics  [name], is a 70 y.o. male, DOB - 13-May-1945, WCH:[id number]  Admit date - 03/07/2015   Admitting Physician Sid Falcon, MD  Outpatient Primary MD for the patient is Shirline Frees, MD  LOS - 4   Chief Complaint  Patient presents with  . Abdominal Pain       Admission HPI/Brief narrative: 70yo man with PMH of MDS, anemia, CAD, HTN, AAA with repair, GERD, Depression who presents with left flank and upper quadrant pain, known history of MDS, followed by Dr. Anderson Malta, treated with supportive transfusion, workup significant for splenomegaly, admitted for pain control , seen by oncology service, had CT guided bone marrow biopsy 02/17/2015, seen by surgery for evaluation for splenectomy, which will be performed this Wednesday. Seen by cardiology for preoperative management Subjective:   [name] today has, No headache, No chest pain,No Cough - SOB, abdominal pain much better controlled on current regimen  Assessment & Plan    Active Problems:   Essential hypertension   Coronary atherosclerosis of native coronary artery   Pancytopenia (HCC)   MDS (myelodysplastic syndrome), high grade (HCC)   Abdominal pain   Splenomegaly   Absolute anemia   Preop cardiovascular exam  Splenomegaly and Abdominal pain - Patient with known history of MDS, oncology consulted, unclear if  his splenomegaly  is related to his MDS. - Abdominal pain is controlled on current regimen. - received pneumococcal/meningococcal/Haemophilus vaccine - Seen by surgery, planned for splenectomy this Wednesday.    MDS (myelodysplastic syndrome), high grade with pancytopenia - Oncology consult appreciated, managed with transfusion support. - Received 2 units PRBC 2/3 given hemoglobin of 6.8, transfused another unit on 2/6 as Hgb 7.2, no evidence of active bleed, hemoglobin is 7.9 today. - No active  bleeding, no indication to transfuse platelet, but will certainly need platelet transfusion preoperatively. - CT-guided bone marrow biopsy 02/17/2015 by IR, results should be available today.   Coronary atherosclerosis of native coronary artery - Only home med appears to be PRN nitro, this was continued  GERD - Continue protonix, sucralfate, donnatal.   Oral thrush - Started on Diflucan and Magic mouthwash  History of DVT - Status post IVC filter, no anticoagulation secondary to thrombocytopenia, encouraged to ambulate.  Code Status: Full  Family Communication: none at bedside  Disposition Plan: pending further work up.   Procedures  Bone marrow CT-guided biopsy 02/17/2015 by IR   2 units PRBC transfusion 03/09/2015  1 unit PRBC 02/19/2015  Consults   Oncology Gen. surgery Cardiology  Medications  Scheduled Meds: . sodium chloride   Intravenous Once  . belladonna-PHENObarbital  5 mL Oral TID  . feeding supplement (ENSURE ENLIVE)  237 mL Oral BID BM  . fentaNYL  25 mcg Transdermal Q72H  . fluconazole  200 mg Oral Daily  . fluticasone  2 spray Each Nare Daily  . loratadine  10 mg Oral Daily  . magic mouthwash  5 mL Oral QID  . multivitamin  1 tablet Oral BID  . pantoprazole  40 mg Oral BID  . polyethylene glycol  17 g Oral BID  . senna-docusate  2 tablet Oral BID  . simvastatin  20 mg Oral QHS   Continuous Infusions: . sodium chloride 50  mL/hr at 02/19/15 1414   PRN Meds:.alum & mag hydroxide-simeth, bisacodyl, diphenhydrAMINE, HYDROmorphone (DILAUDID) injection, HYDROmorphone (DILAUDID) injection, magnesium hydroxide, nitroGLYCERIN, ondansetron **OR** ondansetron (ZOFRAN) IV, temazepam  DVT Prophylaxis   SCDs, has IVC filter  Lab Results  Component Value Date   PLT 19* 02/20/2015    Antibiotics    Anti-infectives    Start     Dose/Rate Route Frequency Ordered Stop   02/19/15 1000  fluconazole (DIFLUCAN) tablet 200 mg     200 mg Oral Daily 02/19/15 0850  02/23/15 0959          Objective:   Filed Vitals:   02/19/15 1549 02/19/15 1739 02/19/15 2033 02/20/15 0424  BP: 125/70 116/64 111/66 117/64  Pulse: 84 88 81 88  Temp: 99.1 F (37.3 C) 99.4 F (37.4 C) 99.3 F (37.4 C) 99.5 F (37.5 C)  TempSrc: Oral Oral Oral Oral  Resp: _0 Height:      Weight:      SpO2: 94% 94% 91% 90%    Wt Readings from Last 3 Encounters:  02/28/2015 74 kg (163 lb 2.3 oz)  02/01/15 75.297 kg (166 lb)  01/25/15 75.206 kg (165 lb 12.8 oz)     Intake/Output Summary (Last 24 hours) at 02/20/15 1257 Last data filed at 02/20/15 0424  Gross per 24 hour  Intake 1032.83 ml  Output    400 ml  Net 632.83 ml     Physical Exam  Awake Alert, Oriented X 3, No new F.N deficits, Normal affect [facility].AT,PERRAL Supple Neck,No JVD Symmetrical Chest wall movement, Good air movement bilaterally,  RRR,No Gallops,Rubs or new Murmurs, No Parasternal Heave +ve B.Sounds,palpable spleen, diffuse tenderness with guarding . No Cyanosis, Clubbing or edema, No new Rash or bruise    Data Review   Micro Results No results found for this or any previous visit (from the past 240 hour(s)).  Radiology Reports Ct Abdomen Pelvis W Contrast  02/26/2015  CLINICAL DATA:  70 year old male with myelodysplastic syndrome, left side abdominal pain, splenomegaly on ultrasound. Subsequent encounter. EXAM: CT ABDOMEN AND PELVIS WITH CONTRAST TECHNIQUE: Multidetector CT imaging of the abdomen and pelvis was performed using the standard protocol following bolus administration of intravenous contrast. CONTRAST:  155m OMNIPAQUE IOHEXOL 300 MG/ML  SOLN COMPARISON:  Ultrasound 02/13/2015. CT Abdomen and Pelvis 11/17/2014 and earlier. FINDINGS: Stable lung bases.  No pericardial or pleural effusion. Chronic left ninth rib fracture appears stable. No acute osseous abnormality identified. Trace pelvic free fluid best seen on coronal image 66, nonspecific. Unremarkable urinary bladder.  Negative rectum. Severe diverticulosis of the sigmoid colon, but no active inflammation. Diverticulosis continues into the descending colon which is medially displaced due to the severe splenomegaly. No left colon inflammation. Negative transverse colon, right colon, and appendix. Negative terminal ileum. No dilated small bowel. Decompressed stomach. Negative duodenum ; chronic second portion duodenum diverticulum. Severe splenomegaly, significantly progressed since November. Estimated splenic volume 2092 mL (normal splenic volume range 83 - 412 mL). This is compared to an estimated splenic volume of 1100 mL in November. There is subtle abnormal hypo enhancement along the posterior superior pole of the spleen, best seen on series 2, image 14. There is trace perisplenic fluid/stranding along the inferior pole. No abdominal free air. No other abdominal free fluid identified. Surgically absent gallbladder. Liver, pancreas, adrenal glands (stable chronic left adrenal nodule), and kidneys are stable. Portal venous system remains patent. Sequelae of infrarenal abdominal aortic aneurysm repair with bifurcated endograft. IVC filter in  place. The IVC and major pelvic veins appear patent. No lymphadenopathy. IMPRESSION: 1. Severe splenomegaly, nearly doubled in size since November. No evidence of acute splenic rupture, but there is abnormal hypoenhancement along the superior pole of the spleen and trace perisplenic inflammation or fluid which is nonspecific. Developing splenic infarct is a consideration. 2. Trace pelvic free fluid, favor reactive to #1. 3. Otherwise stable abdomen and pelvis. Electronically Signed   By: Genevie Ann M.D.   On: 03/11/2015 14:54   US Abdomen Limited  02/13/2015  CLINICAL DATA:  Left upper quadrant pain x1 week, splenomegaly EXAM: LIMITED ABDOMINAL ULTRASOUND COMPARISON:  CT abdomen pelvis dated 11/17/2014 FINDINGS: Spleen is enlarged, measuring 18.6 x 18.1 x 9.6 cm (calculated volume 1691 mL).  IMPRESSION: Severe splenomegaly, measuring up to 18.6 cm in maximal dimension (calculated volume 1691 mL). Electronically Signed   By: Julian Hy M.D.   On: 02/13/2015 11:22   Ct Biopsy  02/17/2015  CLINICAL DATA:  Myelodysplastic syndrome, splenomegaly EXAM: CT GUIDED DEEP ILIAC BONE ASPIRATION AND CORE BIOPSY TECHNIQUE: The procedure, risks (including but not limited to bleeding, infection, organ damage ), benefits, and alternatives were explained to the patient. Questions regarding the procedure were encouraged and answered. The patient understands and consents to the procedure. Patient was placed supine on the CT gantry and limited axial scans through the pelvis were obtained. Appropriate skin entry site was identified. Skin site was marked, prepped with Betadine, draped in usual sterile fashion, and infiltrated locally with 1% lidocaine. Intravenous Fentanyl and Versed were administered as conscious sedation during continuous monitoring of the patient's level of consciousness and physiological / cardiorespiratory status by the radiology RN, with a total moderate sedation time of 10 minutes. Under CT fluoroscopic guidance an 11-gauge Cook trocar bone needle was advanced into the right iliac bone just lateral to the sacroiliac joint. Once needle tip position was confirmed, coaxial core and aspiration samples were obtained. The final sample was obtained using the guiding needle itself, which was then removed. Post procedure scans show no hematoma or fracture. Patient tolerated procedure well. COMPLICATIONS: COMPLICATIONS none IMPRESSION: 1. Technically successful CT guided right iliac bone core and aspiration biopsy. Electronically Signed   By: Lucrezia Europe M.D.   On: 02/17/2015 11:50     CBC  Recent Labs Lab 03/07/2015 1221 02/23/2015 1945 02/17/15 0635 02/18/15 0520 02/19/15 0540 02/20/15 0500  WBC 3.4* 3.4* 3.8* 4.2 3.5* 3.6*  HGB 7.1* 6.6* 8.6* 8.3* 7.2* 7.9*  HCT 21.5* 20.3* 26.2* 25.4*  22.4* 23.9*  PLT 37* 36* 38* 33* 27* 19*  MCV 81.7 81.9 84.8 82.7 85.2 83.0  MCH 27.0 26.6 27.8 27.0 27.4 27.4  MCHC 33.0 32.5 32.8 32.7 32.1 33.1  RDW 17.6* 17.5* 16.5* 16.6* 17.0* 17.0*  LYMPHSABS 0.6*  --   --   --  1.1 1.4  MONOABS 1.0  --   --   --  1.1* 1.2*  EOSABS 0.0  --   --   --  0.0 0.0  BASOSABS 0.0  --   --   --  0.0 0.0    Chemistries   Recent Labs Lab 02/18/2015 1221 02/17/15 0635 02/18/15 0520 02/20/15 0500  NA 142 140 136 142  K 3.8 4.0 4.1 4.1  CL 107 106 103 106  CO2 _0 GLUCOSE 116* 116* 119* 128*  BUN 25* 22* 23* 27*  CREATININE 1.15 1.04 1.21 1.23  CALCIUM 9.0 8.8* 8.4* 8.5*  AST 10* 10*  --   --  ALT 9* 9*  --   --   ALKPHOS 68 68  --   --   BILITOT 1.0 1.3*  --   --    ------------------------------------------------------------------------------------------------------------------ estimated creatinine clearance is 59.3 mL/min (by C-G formula based on Cr of 1.23). ------------------------------------------------------------------------------------------------------------------ No results for input(s): HGBA1C in the last 72 hours. ------------------------------------------------------------------------------------------------------------------ No results for input(s): CHOL, HDL, LDLCALC, TRIG, CHOLHDL, LDLDIRECT in the last 72 hours. ------------------------------------------------------------------------------------------------------------------ No results for input(s): TSH, T4TOTAL, T3FREE, THYROIDAB in the last 72 hours.  Invalid input(s): FREET3 ------------------------------------------------------------------------------------------------------------------ No results for input(s): VITAMINB12, FOLATE, FERRITIN, TIBC, IRON, RETICCTPCT in the last 72 hours.  Coagulation profile  Recent Labs Lab 02/17/15 0635  INR 1.35    No results for input(s): DDIMER in the last 72 hours.  Cardiac Enzymes No results for input(s): CKMB,  TROPONINI, MYOGLOBIN in the last 168 hours.  Invalid input(s): CK ------------------------------------------------------------------------------------------------------------------ Invalid input(s): POCBNP     Time Spent in minutes   25 minutes   Kohler Pellerito M.D on 02/20/2015 at 12:57 PM  Between 7am to 7pm - Pager - [phone]  After 7pm go to [email or web address] - password [facility]  [facility]   Office  [phone]

## 2015-02-21 ENCOUNTER — Ambulatory Visit (HOSPITAL_COMMUNITY): Admission: RE | Admit: 2015-02-21 | Payer: Medicare Other | Source: Ambulatory Visit

## 2015-02-21 DIAGNOSIS — C92Z0 Other myeloid leukemia not having achieved remission: Secondary | ICD-10-CM

## 2015-02-21 LAB — CBC WITH DIFFERENTIAL/PLATELET
BASOS PCT: 0 %
Band Neutrophils: 0 %
Basophils Absolute: 0 10*3/uL (ref 0.0–0.1)
Blasts: 31 %
EOS PCT: 0 %
Eosinophils Absolute: 0 10*3/uL (ref 0.0–0.7)
HCT: 22.9 % — ABNORMAL LOW (ref 39.0–52.0)
Hemoglobin: 7.3 g/dL — ABNORMAL LOW (ref 13.0–17.0)
LYMPHS ABS: 0.9 10*3/uL (ref 0.7–4.0)
LYMPHS PCT: 19 %
MCH: 27.2 pg (ref 26.0–34.0)
MCHC: 31.9 g/dL (ref 30.0–36.0)
MCV: 85.4 fL (ref 78.0–100.0)
MONO ABS: 0.8 10*3/uL (ref 0.1–1.0)
MYELOCYTES: 0 %
Metamyelocytes Relative: 0 %
Monocytes Relative: 18 %
NRBC: 0 /100{WBCs}
Neutro Abs: 1.4 10*3/uL — ABNORMAL LOW (ref 1.7–7.7)
Neutrophils Relative %: 32 %
OTHER: 0 %
PLATELETS: 17 10*3/uL — AB (ref 150–400)
Promyelocytes Absolute: 0 %
RBC: 2.68 MIL/uL — ABNORMAL LOW (ref 4.22–5.81)
RDW: 17.4 % — AB (ref 11.5–15.5)
WBC: 4.5 10*3/uL (ref 4.0–10.5)

## 2015-02-21 LAB — BASIC METABOLIC PANEL
Anion gap: 12 (ref 5–15)
BUN: 27 mg/dL — AB (ref 6–20)
CALCIUM: 8.4 mg/dL — AB (ref 8.9–10.3)
CO2: 22 mmol/L (ref 22–32)
CREATININE: 1.52 mg/dL — AB (ref 0.61–1.24)
Chloride: 109 mmol/L (ref 101–111)
GFR calc Af Amer: 53 mL/min — ABNORMAL LOW (ref >60)
GFR, EST NON AFRICAN AMERICAN: 45 mL/min — AB (ref >60)
GLUCOSE: 126 mg/dL — AB (ref 65–99)
Potassium: 4 mmol/L (ref 3.5–5.1)
Sodium: 143 mmol/L (ref 135–145)

## 2015-02-21 LAB — PROTIME-INR
INR: 1.54 — ABNORMAL HIGH (ref 0.00–1.49)
Prothrombin Time: 18.5 seconds — ABNORMAL HIGH (ref 11.6–15.2)

## 2015-02-21 LAB — APTT: APTT: 43 s — AB (ref 24–37)

## 2015-02-21 LAB — PREPARE RBC (CROSSMATCH)

## 2015-02-21 MED ORDER — SODIUM CHLORIDE 0.9 % IV SOLN: Freq: Once | INTRAVENOUS | Status: DC

## 2015-02-21 MED ORDER — SODIUM CHLORIDE 0.9 % IV SOLN
Freq: Once | INTRAVENOUS | Status: AC
  Administered 2015-02-21: 15:00:00 via INTRAVENOUS

## 2015-02-21 MED ORDER — ACETAMINOPHEN 325 MG PO TABS
650.0000 mg | ORAL_TABLET | ORAL | Status: DC | PRN
  Administered 2015-02-21 (×2): 650 mg via ORAL
  Filled 2015-02-21 (×2): qty 2

## 2015-02-21 MED ORDER — SODIUM CHLORIDE 0.9 % IV SOLN
Freq: Once | INTRAVENOUS | Status: AC
  Administered 2015-02-22 (×2): via INTRAVENOUS

## 2015-02-21 MED ORDER — FUROSEMIDE 10 MG/ML IJ SOLN
20.0000 mg | Freq: Once | INTRAMUSCULAR | Status: AC
  Administered 2015-02-21: 20 mg via INTRAVENOUS
  Filled 2015-02-21: qty 2

## 2015-02-21 MED ORDER — DEXTROSE 5 % IV SOLN
10.0000 mg | Freq: Once | INTRAVENOUS | Status: AC
  Administered 2015-02-21: 10 mg via INTRAVENOUS
  Filled 2015-02-21: qty 1

## 2015-02-21 MED ORDER — SODIUM CHLORIDE 0.9 % IV SOLN
Freq: Once | INTRAVENOUS | Status: AC
  Administered 2015-02-21: 11:00:00 via INTRAVENOUS

## 2015-02-21 NOTE — Progress Notes (Signed)
[name] is in good spirits. He has no specific complaints this morning. He seemed to eat a little bit better.  It sounds like he is still on schedule for tomorrow for splenectomy. Dr. Stanford Breed cardiology saw him. I appreciate his input.  I received a call from the pathologist yesterday. 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 I'm sure this is why his spleen is enlarging. He probably has a lot of sequestration within the spleen. I still think given the spleen out will help him. Again it will help his pain so he will not need a lot of pain medication.  I spoke his wife about the bone marrow results yesterday. She really would not want him to know the results until after his surgery. I think this is certainly reasonable. He needs to have the spleen out. He will not want any therapy for the leukemia. Therefore, have the spleen removed will eliminate one of the major issues with him, that is pain.  I saw that physical therapy saw him. It does not sound like he was willing to participate.  His labs today show his hemoglobin is 7.3. White cell count 4.5. Platelet count 17,000. I would go ahead and give him blood and platelets today. I'm sure he will need some platelets tomorrow.  He's had no cough. He's had no bleeding. He's had no bruising.  His vital signs are all stable. Temperature is 100. Pulse is 92. Blood pressure 113/58. His oral exam shows no mucositis. There is no adenopathy in the neck. Lungs are clear. Cardiac exam regular rate and rhythm. Abdomen is slightly distended but soft. He has tenderness over the left side. He does have the splenomegaly below the left costal margin. Extremities shows no clubbing, cyanosis or edema. Skin exam shows no ecchymoses or petechia.  [name] clearly has transformed over to acute myeloid leukemia. There are some studies that I looked at splenectomies in patients with acute myeloid leukemia. There is some benefit for those who are  symptomatic.  Again, we'll go ahead and transfuse him. I encouraged him to the as much as he could today. This will help him out.  The staff on [address] as always, doing great job with him.  [name] E.  Romans 8:28

## 2015-02-21 NOTE — Progress Notes (Addendum)
Patient Demographics  [name], is a 70 y.o. male, DOB - 10/12/1945, CZY:[id number]  Admit date - 03/10/2015   Admitting Physician Sid Falcon, MD  Outpatient Primary MD for the patient is Shirline Frees, MD  LOS - 5   Chief Complaint  Patient presents with  . Abdominal Pain       Admission HPI/Brief narrative: 70 yo man with PMH of MDS, anemia, CAD, HTN, AAA with repair, GERD, Depression who presents with left flank and upper quadrant pain, known history of MDS, followed by Dr. Marin Olp, treated with supportive transfusion, workup significant for splenomegaly, admitted for pain control , had CT guided bone marrow biopsy 02/17/2015, significant for transition into AML ,seen by surgery for evaluation for splenectomy, which will be performed this Wednesday. Seen by cardiology for preoperative management. Subjective:   [name] today has, No headache, No chest pain,No Cough - SOB, abdominal pain much better controlled on current regimen  Assessment & Plan    Active Problems:   Essential hypertension   Coronary atherosclerosis of native coronary artery   Pancytopenia (HCC)   MDS (myelodysplastic syndrome), high grade (HCC)   Abdominal pain   Splenomegaly   Absolute anemia   Preop cardiovascular exam  Splenomegaly and Abdominal pain - Patient with known history of MDS, currently AML. - Abdominal pain is controlled on current regimen. - received pneumococcal/meningococcal/Haemophilus vaccine - Seen by surgery, planned for splenectomy tomorrow. - Will receive 2 units PRBC today, 1 unit platelet today, having 4 units PRBC on hold for tomorrow, 2 units platelets on hold for tomorrow, 4 units FFP's on hold for tomorrow.    MDS (myelodysplastic syndrome), recent bone marrow biopsy showing acute myeloid leukemia. - Oncology consult appreciated, managed with transfusion support as  outpatient. - Received 2 units PRBC 2/3 given hemoglobin of 6.8, transfused 1 unit on 2/5, tenderness on 12/7, 1 unit platelet on 2/7 , further incision will be managed by surgery preoperatively. - No active bleeding, but will receive 1 unit lately today for anticipated surgery in a.m., will be transfused platelet and PRBC prior to surgery, be managed by surgery/ - CT-guided bone marrow biopsy 02/17/2015 by IR, results showing acute myeloid leukemia, followed by oncology. - Patient will receive 10 units IV vitamin K today for INR 1.5 as discussed with surgery.  Coronary atherosclerosis of native coronary artery - Only home med appears to be PRN nitro, this was continued  GERD - Continue protonix, sucralfate, donnatal.   Oral thrush - Started on Diflucan and Magic mouthwash  History of DVT - Status post IVC filter, no anticoagulation secondary to thrombocytopenia, encouraged to ambulate.  Code Status: Full  Family Communication: Wife at bedside  Disposition Plan: pending further work up.   Procedures  Bone marrow CT-guided biopsy 02/17/2015 by IR   2 units PRBC transfusion 03/04/2015  1 unit PRBC 02/19/2015, 02/21/2015 1 unit platelet 02/21/2015  Consults   Oncology Gen. surgery Cardiology  Medications  Scheduled Meds: . sodium chloride   Intravenous Once  . sodium chloride   Intravenous Once  . sodium chloride   Intravenous Once  . sodium chloride   Intravenous Once  . belladonna-PHENObarbital  5 mL Oral TID  . feeding supplement (ENSURE ENLIVE)  237  mL Oral BID BM  . fentaNYL  25 mcg Transdermal Q72H  . fluconazole  200 mg Oral Daily  . fluticasone  2 spray Each Nare Daily  . loratadine  10 mg Oral Daily  . magic mouthwash  5 mL Oral QID  . multivitamin  1 tablet Oral BID  . pantoprazole  40 mg Oral BID  . phytonadione (VITAMIN K) IV  10 mg Intravenous Once  . polyethylene glycol  17 g Oral BID  . senna-docusate  2 tablet Oral BID  . simvastatin  20 mg Oral QHS    Continuous Infusions: . sodium chloride 50 mL/hr at 02/19/15 1414   PRN Meds:.acetaminophen, alum & mag hydroxide-simeth, bisacodyl, diphenhydrAMINE, HYDROmorphone (DILAUDID) injection, HYDROmorphone (DILAUDID) injection, magnesium hydroxide, nitroGLYCERIN, ondansetron **OR** ondansetron (ZOFRAN) IV, temazepam  DVT Prophylaxis   SCDs, has IVC filter  Lab Results  Component Value Date   PLT 17* 02/21/2015    Antibiotics    Anti-infectives    Start     Dose/Rate Route Frequency Ordered Stop   02/19/15 1000  fluconazole (DIFLUCAN) tablet 200 mg     200 mg Oral Daily 02/19/15 0850 02/23/15 0959          Objective:   Filed Vitals:   02/21/15 1045 02/21/15 1114 02/21/15 1329 02/21/15 1341  BP: 109/68 123/67 131/59 127/64  Pulse: 67 94 94 89  Temp: 98.6 F (37 C) 97.7 F (36.5 C) 98.5 F (36.9 C) 98.4 F (36.9 C)  TempSrc: Oral Oral Oral Oral  Resp: '18 16 17 16  '$ Height:      Weight:      SpO2: 93% 93% 95% 96%    Wt Readings from Last 3 Encounters:  02/27/2015 74 kg (163 lb 2.3 oz)  02/01/15 75.297 kg (166 lb)  01/25/15 75.206 kg (165 lb 12.8 oz)     Intake/Output Summary (Last 24 hours) at 02/21/15 1401 Last data filed at 02/21/15 1329  Gross per 24 hour  Intake    927 ml  Output    200 ml  Net    727 ml     Physical Exam  Awake Alert, Oriented X 3, No new F.N deficits, Normal affect [place].AT,PERRAL Supple Neck,No JVD Symmetrical Chest wall movement, Good air movement bilaterally,  RRR,No Gallops,Rubs or new Murmurs, No Parasternal Heave +ve B.Sounds,palpable spleen, diffuse tenderness with guarding . No Cyanosis, Clubbing or edema, No new Rash or bruise    Data Review   Micro Results No results found for this or any previous visit (from the past 240 hour(s)).  Radiology Reports Ct Abdomen Pelvis W Contrast  02/17/2015  CLINICAL DATA:  70 year old male with myelodysplastic syndrome, left side abdominal pain, splenomegaly on ultrasound. Subsequent  encounter. EXAM: CT ABDOMEN AND PELVIS WITH CONTRAST TECHNIQUE: Multidetector CT imaging of the abdomen and pelvis was performed using the standard protocol following bolus administration of intravenous contrast. CONTRAST:  149m OMNIPAQUE IOHEXOL 300 MG/ML  SOLN COMPARISON:  Ultrasound 02/13/2015. CT Abdomen and Pelvis 11/17/2014 and earlier. FINDINGS: Stable lung bases.  No pericardial or pleural effusion. Chronic left ninth rib fracture appears stable. No acute osseous abnormality identified. Trace pelvic free fluid best seen on coronal image 66, nonspecific. Unremarkable urinary bladder. Negative rectum. Severe diverticulosis of the sigmoid colon, but no active inflammation. Diverticulosis continues into the descending colon which is medially displaced due to the severe splenomegaly. No left colon inflammation. Negative transverse colon, right colon, and appendix. Negative terminal ileum. No dilated small bowel. Decompressed stomach. Negative  duodenum ; chronic second portion duodenum diverticulum. Severe splenomegaly, significantly progressed since November. Estimated splenic volume 2092 mL (normal splenic volume range 83 - 412 mL). This is compared to an estimated splenic volume of 1100 mL in November. There is subtle abnormal hypo enhancement along the posterior superior pole of the spleen, best seen on series 2, image 14. There is trace perisplenic fluid/stranding along the inferior pole. No abdominal free air. No other abdominal free fluid identified. Surgically absent gallbladder. Liver, pancreas, adrenal glands (stable chronic left adrenal nodule), and kidneys are stable. Portal venous system remains patent. Sequelae of infrarenal abdominal aortic aneurysm repair with bifurcated endograft. IVC filter in place. The IVC and major pelvic veins appear patent. No lymphadenopathy. IMPRESSION: 1. Severe splenomegaly, nearly doubled in size since November. No evidence of acute splenic rupture, but there is  abnormal hypoenhancement along the superior pole of the spleen and trace perisplenic inflammation or fluid which is nonspecific. Developing splenic infarct is a consideration. 2. Trace pelvic free fluid, favor reactive to #1. 3. Otherwise stable abdomen and pelvis. Electronically Signed   By: Genevie Ann M.D.   On: 03/13/2015 14:54   US Abdomen Limited  02/13/2015  CLINICAL DATA:  Left upper quadrant pain x1 week, splenomegaly EXAM: LIMITED ABDOMINAL ULTRASOUND COMPARISON:  CT abdomen pelvis dated 11/17/2014 FINDINGS: Spleen is enlarged, measuring 18.6 x 18.1 x 9.6 cm (calculated volume 1691 mL). IMPRESSION: Severe splenomegaly, measuring up to 18.6 cm in maximal dimension (calculated volume 1691 mL). Electronically Signed   By: Julian Hy M.D.   On: 02/13/2015 11:22   Ct Biopsy  02/17/2015  CLINICAL DATA:  Myelodysplastic syndrome, splenomegaly EXAM: CT GUIDED DEEP ILIAC BONE ASPIRATION AND CORE BIOPSY TECHNIQUE: The procedure, risks (including but not limited to bleeding, infection, organ damage ), benefits, and alternatives were explained to the patient. Questions regarding the procedure were encouraged and answered. The patient understands and consents to the procedure. Patient was placed supine on the CT gantry and limited axial scans through the pelvis were obtained. Appropriate skin entry site was identified. Skin site was marked, prepped with Betadine, draped in usual sterile fashion, and infiltrated locally with 1% lidocaine. Intravenous Fentanyl and Versed were administered as conscious sedation during continuous monitoring of the patient's level of consciousness and physiological / cardiorespiratory status by the radiology RN, with a total moderate sedation time of 10 minutes. Under CT fluoroscopic guidance an 11-gauge Cook trocar bone needle was advanced into the right iliac bone just lateral to the sacroiliac joint. Once needle tip position was confirmed, coaxial core and aspiration samples were  obtained. The final sample was obtained using the guiding needle itself, which was then removed. Post procedure scans show no hematoma or fracture. Patient tolerated procedure well. COMPLICATIONS: COMPLICATIONS none IMPRESSION: 1. Technically successful CT guided right iliac bone core and aspiration biopsy. Electronically Signed   By: Lucrezia Europe M.D.   On: 02/17/2015 11:50     CBC  Recent Labs Lab 02/15/2015 1221  02/17/15 0635 02/18/15 0520 02/19/15 0540 02/20/15 0500 02/21/15 0528  WBC 3.4*  < > 3.8* 4.2 3.5* 3.6* 4.5  HGB 7.1*  < > 8.6* 8.3* 7.2* 7.9* 7.3*  HCT 21.5*  < > 26.2* 25.4* 22.4* 23.9* 22.9*  PLT 37*  < > 38* 33* 27* 19* 17*  MCV 81.7  < > 84.8 82.7 85.2 83.0 85.4  MCH 27.0  < > 27.8 27.0 27.4 27.4 27.2  MCHC 33.0  < > 32.8 32.7 32.1 33.1 31.9  RDW 17.6*  < > 16.5* 16.6* 17.0* 17.0* 17.4*  LYMPHSABS 0.6*  --   --   --  1.1 1.4 0.9  MONOABS 1.0  --   --   --  1.1* 1.2* 0.8  EOSABS 0.0  --   --   --  0.0 0.0 0.0  BASOSABS 0.0  --   --   --  0.0 0.0 0.0  < > = values in this interval not displayed.  Chemistries   Recent Labs Lab 03/07/2015 1221 02/17/15 0635 02/18/15 0520 02/20/15 0500 02/21/15 0528  NA 142 140 136 142 143  K 3.8 4.0 4.1 4.1 4.0  CL 107 106 103 106 109  CO2 '24 25 24 26 22  '$ GLUCOSE 116* 116* 119* 128* 126*  BUN 25* 22* 23* 27* 27*  CREATININE 1.15 1.04 1.21 1.23 1.52*  CALCIUM 9.0 8.8* 8.4* 8.5* 8.4*  AST 10* 10*  --   --   --   ALT 9* 9*  --   --   --   ALKPHOS 68 68  --   --   --   BILITOT 1.0 1.3*  --   --   --    ------------------------------------------------------------------------------------------------------------------ estimated creatinine clearance is 48 mL/min (by C-G formula based on Cr of 1.52). ------------------------------------------------------------------------------------------------------------------ No results for input(s): HGBA1C in the last 72  hours. ------------------------------------------------------------------------------------------------------------------ No results for input(s): CHOL, HDL, LDLCALC, TRIG, CHOLHDL, LDLDIRECT in the last 72 hours. ------------------------------------------------------------------------------------------------------------------ No results for input(s): TSH, T4TOTAL, T3FREE, THYROIDAB in the last 72 hours.  Invalid input(s): FREET3 ------------------------------------------------------------------------------------------------------------------ No results for input(s): VITAMINB12, FOLATE, FERRITIN, TIBC, IRON, RETICCTPCT in the last 72 hours.  Coagulation profile  Recent Labs Lab 02/17/15 0635 02/21/15 0750  INR 1.35 1.54*    No results for input(s): DDIMER in the last 72 hours.  Cardiac Enzymes No results for input(s): CKMB, TROPONINI, MYOGLOBIN in the last 168 hours.  Invalid input(s): CK ------------------------------------------------------------------------------------------------------------------ Invalid input(s): POCBNP     Time Spent in minutes   25 minutes   Mickie Kozikowski M.D on 02/21/2015 at 2:01 PM  Between 7am to 7pm - Pager - [phone]  After 7pm go to [email or web address] - password [facility]  [facility]   Office  [phone]

## 2015-02-21 NOTE — Progress Notes (Signed)
[facility]:  [phone] General Surgery Progress Note   LOS: 5 days  POD -    PCP - Dr. Tomasita Crumble   Assessment/Plan: 1.  Splenomegaly  Symptomatic.  Plan splenectomy   Discussed with Dr. Marin Olp.  2.  Myelodysplastic syndrome  Bone marrow biopsy - acute myeloid leukemia  I discussed the leukemia with the patient.  His wife and son, [name], were in the room. 3a.  Thrombocytopenia  Plt count - 17,000 - 02/21/2015  Will give one pack of platelets today and have some available tomorrow 3b. Anemia  Hgb 7.3 - 02/21/2015  Transfuse 2 units today 3c.  PT/INR - 18.5/1.54 - 02/21/2015  Will have FFP available for surgery tomorrow.  4.  CAD  Inferior MI in 2010.  Sees Dr. Burt Knack  Last Echo - 03/31/2014 - EF - 55%  Appreciate Dr. Jacalyn Lefevre input 5.  History of VATS 6.  History of endovascular stent for AAA    7.  History of IVC filter for DVT 8.  DVT prophylaxis - on hold for thrombocytopenia 9.  History of "dilaudid withdrawal" when he had his chest surgery.   Active Problems:   Essential hypertension   Coronary atherosclerosis of native coronary artery   Pancytopenia (HCC)   MDS (myelodysplastic syndrome), high grade (HCC)   Abdominal pain   Splenomegaly   Absolute anemia   Preop cardiovascular exam   Subjective:  Deconditioned patient.  Has abdominal pain attributed to spleen. More alert today.  His son will try to walk him later today.  He also needs to work on his inc spriometry.  His wife and son are in the room.  Objective:   Filed Vitals:   02/21/15 1045 02/21/15 1114  BP: 109/68 123/67  Pulse: 67 94  Temp: 98.6 F (37 C) 97.7 F (36.5 C)  Resp: 18 16     Intake/Output from previous day:  02/06 0701 - 02/07 0700 In: 36 [P.O.:322] Out: 200 [Urine:200]  Intake/Output this shift:  Total I/O In: 270 [P.O.:240; Blood:30] Out: -    Physical Exam:   General: Thin older WM who is alert and oriented.    HEENT: Normal. Pupils equal. .    Lungs: Clear, though inspiratory effort is not great.  His IS is 1,200 cc   Cards:  3/6 systolic murmur   Abdomen: Spleen dominates the left side of his abdomen. He has some tenderness on the left side also.   Lab Results:     Recent Labs  02/20/15 0500 02/21/15 0528  WBC 3.6* 4.5  HGB 7.9* 7.3*  HCT 23.9* 22.9*  PLT 19* 17*    BMET    Recent Labs  02/20/15 0500 02/21/15 0528  NA 142 143  K 4.1 4.0  CL 106 109  CO2 26 22  GLUCOSE 128* 126*  BUN 27* 27*  CREATININE 1.23 1.52*  CALCIUM 8.5* 8.4*    PT/INR    Recent Labs  02/21/15 0750  LABPROT 18.5*  INR 1.54*    ABG  No results for input(s): PHART, HCO3 in the last 72 hours.  Invalid input(s): PCO2, PO2   Studies/Results:  No results found.   Anti-infectives:   Anti-infectives    Start     Dose/Rate Route Frequency Ordered Stop   02/19/15 1000  fluconazole (DIFLUCAN) tablet 200 mg     200 mg Oral Daily 02/19/15 0850 02/23/15 0959      Darrell Overall, MD, FACS Pager: [facility]: [phone]  02/21/2015   

## 2015-02-21 NOTE — Progress Notes (Signed)
Order for Platelets clarified with Earnstine Regal and he notified this nurse that platelets needed tomorrow for sugery and not to transfused today.

## 2015-02-21 NOTE — Progress Notes (Signed)
Dr. Landis Gandy notified this writer to transfuse one unit of platelet today.

## 2015-02-21 NOTE — Progress Notes (Signed)
Patient ambulated in the hallway with walker. Tolerated the activity. Incentive spirometer encouraged. Will continue to monitor.

## 2015-02-22 ENCOUNTER — Encounter (HOSPITAL_COMMUNITY): Admission: EM | Disposition: E | Payer: Self-pay | Source: Home / Self Care | Attending: Internal Medicine

## 2015-02-22 ENCOUNTER — Inpatient Hospital Stay (HOSPITAL_COMMUNITY): Payer: Medicare Other

## 2015-02-22 ENCOUNTER — Inpatient Hospital Stay (HOSPITAL_COMMUNITY): Payer: Medicare Other | Admitting: Registered Nurse

## 2015-02-22 ENCOUNTER — Encounter (HOSPITAL_COMMUNITY): Payer: Self-pay | Admitting: Anesthesiology

## 2015-02-22 DIAGNOSIS — C9200 Acute myeloblastic leukemia, not having achieved remission: Secondary | ICD-10-CM | POA: Diagnosis present

## 2015-02-22 DIAGNOSIS — L8990 Pressure ulcer of unspecified site, unspecified stage: Secondary | ICD-10-CM | POA: Diagnosis present

## 2015-02-22 DIAGNOSIS — D649 Anemia, unspecified: Secondary | ICD-10-CM

## 2015-02-22 DIAGNOSIS — Z9081 Acquired absence of spleen: Secondary | ICD-10-CM

## 2015-02-22 HISTORY — PX: LAPAROSCOPIC SPLENECTOMY: SHX409

## 2015-02-22 LAB — BASIC METABOLIC PANEL
ANION GAP: 9 (ref 5–15)
Anion gap: 10 (ref 5–15)
BUN: 27 mg/dL — AB (ref 6–20)
BUN: 18 mg/dL (ref 6–20)
CHLORIDE: 107 mmol/L (ref 101–111)
CO2: 25 mmol/L (ref 22–32)
CO2: 23 mmol/L (ref 22–32)
CREATININE: 1.18 mg/dL (ref 0.61–1.24)
Calcium: 7.9 mg/dL — ABNORMAL LOW (ref 8.9–10.3)
Calcium: 7.4 mg/dL — ABNORMAL LOW (ref 8.9–10.3)
Chloride: 108 mmol/L (ref 101–111)
Creatinine, Ser: 1.12 mg/dL (ref 0.61–1.24)
GFR calc Af Amer: >60 mL/min (ref >60)
GFR calc Af Amer: >60 mL/min (ref >60)
GFR calc non Af Amer: >60 mL/min (ref >60)
Glucose, Bld: 113 mg/dL — ABNORMAL HIGH (ref 65–99)
Glucose, Bld: 155 mg/dL — ABNORMAL HIGH (ref 65–99)
POTASSIUM: 3.7 mmol/L (ref 3.5–5.1)
Potassium: 3.9 mmol/L (ref 3.5–5.1)
SODIUM: 140 mmol/L (ref 135–145)
Sodium: 142 mmol/L (ref 135–145)

## 2015-02-22 LAB — CBC WITH DIFFERENTIAL/PLATELET
BASOS ABS: 0 10*3/uL (ref 0.0–0.1)
BLASTS: 13 %
Band Neutrophils: 0 %
Basophils Relative: 0 %
Eosinophils Absolute: 0 10*3/uL (ref 0.0–0.7)
Eosinophils Relative: 1 %
HEMATOCRIT: 23.1 % — AB (ref 39.0–52.0)
HEMOGLOBIN: 7.7 g/dL — AB (ref 13.0–17.0)
LYMPHS PCT: 26 %
Lymphs Abs: 1.1 10*3/uL (ref 0.7–4.0)
MCH: 27.8 pg (ref 26.0–34.0)
MCHC: 33.3 g/dL (ref 30.0–36.0)
MCV: 83.4 fL (ref 78.0–100.0)
METAMYELOCYTES PCT: 0 %
MONO ABS: 0.6 10*3/uL (ref 0.1–1.0)
MONOS PCT: 15 %
Myelocytes: 0 %
Neutro Abs: 1.9 10*3/uL (ref 1.7–7.7)
Neutrophils Relative %: 45 %
Other: 0 %
PROMYELOCYTES ABS: 0 %
Platelets: 15 10*3/uL — CL (ref 150–400)
RBC: 2.77 MIL/uL — AB (ref 4.22–5.81)
RDW: 16.5 % — ABNORMAL HIGH (ref 11.5–15.5)
WBC: 4.3 10*3/uL (ref 4.0–10.5)
nRBC: 0 /100 WBC

## 2015-02-22 LAB — CBC
HCT: 30.4 % — ABNORMAL LOW (ref 39.0–52.0)
Hemoglobin: 10.5 g/dL — ABNORMAL LOW (ref 13.0–17.0)
MCH: 29.5 pg (ref 26.0–34.0)
MCHC: 34.5 g/dL (ref 30.0–36.0)
MCV: 85.4 fL (ref 78.0–100.0)
PLATELETS: 38 10*3/uL — AB (ref 150–400)
RBC: 3.56 MIL/uL — AB (ref 4.22–5.81)
RDW: 15 % (ref 11.5–15.5)
WBC: 6.5 10*3/uL (ref 4.0–10.5)

## 2015-02-22 LAB — SURGICAL PCR SCREEN
MRSA, PCR: NEGATIVE
STAPHYLOCOCCUS AUREUS: NEGATIVE

## 2015-02-22 SURGERY — SPLENECTOMY, LAPAROSCOPIC
Anesthesia: General

## 2015-02-22 MED ORDER — ACETAMINOPHEN 10 MG/ML IV SOLN
INTRAVENOUS | Status: AC
  Filled 2015-02-22: qty 100

## 2015-02-22 MED ORDER — FENTANYL CITRATE (PF) 100 MCG/2ML IJ SOLN
INTRAMUSCULAR | Status: DC | PRN
  Administered 2015-02-22: 50 ug via INTRAVENOUS
  Administered 2015-02-22: 100 ug via INTRAVENOUS
  Administered 2015-02-22 (×5): 50 ug via INTRAVENOUS

## 2015-02-22 MED ORDER — ONDANSETRON HCL 4 MG/2ML IJ SOLN: 4.0000 mg | Freq: Four times a day (QID) | INTRAMUSCULAR | Status: DC | PRN

## 2015-02-22 MED ORDER — PANTOPRAZOLE SODIUM 40 MG IV SOLR: 40.0000 mg | INTRAVENOUS | Status: DC

## 2015-02-22 MED ORDER — FLUCONAZOLE IN SODIUM CHLORIDE 200-0.9 MG/100ML-% IV SOLN
200.0000 mg | INTRAVENOUS | Status: AC
  Administered 2015-02-23 – 2015-02-25 (×4): 200 mg via INTRAVENOUS
  Filled 2015-02-22 (×6): qty 100

## 2015-02-22 MED ORDER — LACTATED RINGERS IV SOLN
INTRAVENOUS | Status: DC | PRN
  Administered 2015-02-22: 1000 mL

## 2015-02-22 MED ORDER — NALOXONE HCL 0.4 MG/ML IJ SOLN: 0.4000 mg | INTRAMUSCULAR | Status: DC | PRN

## 2015-02-22 MED ORDER — LIDOCAINE-PRILOCAINE 2.5-2.5 % EX CREA: 1.0000 "application " | TOPICAL_CREAM | CUTANEOUS | Status: DC | PRN

## 2015-02-22 MED ORDER — HYDROMORPHONE HCL 1 MG/ML IJ SOLN
INTRAMUSCULAR | Status: DC | PRN
  Administered 2015-02-22 (×4): 1 mg via INTRAVENOUS

## 2015-02-22 MED ORDER — KCL IN DEXTROSE-NACL 20-5-0.45 MEQ/L-%-% IV SOLN
INTRAVENOUS | Status: DC
  Administered 2015-02-22: 18:00:00 via INTRAVENOUS
  Filled 2015-02-22: qty 1000

## 2015-02-22 MED ORDER — BISACODYL 10 MG RE SUPP: 10.0000 mg | Freq: Two times a day (BID) | RECTAL | Status: DC | PRN

## 2015-02-22 MED ORDER — ACETAMINOPHEN 10 MG/ML IV SOLN
1000.0000 mg | Freq: Four times a day (QID) | INTRAVENOUS | Status: AC
  Administered 2015-02-22 – 2015-02-23 (×3): 1000 mg via INTRAVENOUS
  Filled 2015-02-22 (×4): qty 100

## 2015-02-22 MED ORDER — MIDAZOLAM HCL 5 MG/5ML IJ SOLN
INTRAMUSCULAR | Status: DC | PRN
  Administered 2015-02-22 (×2): 0.5 mg via INTRAVENOUS

## 2015-02-22 MED ORDER — DIPHENHYDRAMINE HCL 50 MG/ML IJ SOLN: 12.5000 mg | Freq: Four times a day (QID) | INTRAMUSCULAR | Status: DC | PRN

## 2015-02-22 MED ORDER — ROCURONIUM BROMIDE 100 MG/10ML IV SOLN
INTRAVENOUS | Status: DC | PRN
  Administered 2015-02-22: 60 mg via INTRAVENOUS
  Administered 2015-02-22: 40 mg via INTRAVENOUS
  Administered 2015-02-22: 20 mg via INTRAVENOUS

## 2015-02-22 MED ORDER — MAGIC MOUTHWASH: 15.0000 mL | Freq: Four times a day (QID) | ORAL | Status: DC | PRN

## 2015-02-22 MED ORDER — SODIUM CHLORIDE 0.9 % IV SOLN
1.0000 g | Freq: Once | INTRAVENOUS | Status: DC
  Filled 2015-02-22: qty 10

## 2015-02-22 MED ORDER — PHENYLEPHRINE HCL 10 MG/ML IJ SOLN
INTRAMUSCULAR | Status: AC
  Filled 2015-02-22: qty 1

## 2015-02-22 MED ORDER — MENTHOL 3 MG MT LOZG: 1.0000 | LOZENGE | OROMUCOSAL | Status: DC | PRN

## 2015-02-22 MED ORDER — METHOCARBAMOL 1000 MG/10ML IJ SOLN
1000.0000 mg | Freq: Four times a day (QID) | INTRAMUSCULAR | Status: DC | PRN
  Filled 2015-02-22: qty 10

## 2015-02-22 MED ORDER — BUPIVACAINE LIPOSOME 1.3 % IJ SUSP
20.0000 mL | Freq: Once | INTRAMUSCULAR | Status: AC
  Administered 2015-02-22: 20 mL
  Filled 2015-02-22: qty 20

## 2015-02-22 MED ORDER — LIDOCAINE HCL (CARDIAC) 20 MG/ML IV SOLN
INTRAVENOUS | Status: AC
  Filled 2015-02-22: qty 5

## 2015-02-22 MED ORDER — PHENOL 1.4 % MT LIQD: 2.0000 | OROMUCOSAL | Status: DC | PRN

## 2015-02-22 MED ORDER — 0.9 % SODIUM CHLORIDE (POUR BTL) OPTIME
TOPICAL | Status: DC | PRN
  Administered 2015-02-22: 1000 mL

## 2015-02-22 MED ORDER — MIDAZOLAM HCL 2 MG/2ML IJ SOLN
INTRAMUSCULAR | Status: AC
  Filled 2015-02-22: qty 2

## 2015-02-22 MED ORDER — HYDROMORPHONE HCL 1 MG/ML IJ SOLN: 1.0000 mg | INTRAMUSCULAR | Status: DC | PRN

## 2015-02-22 MED ORDER — DIPHENHYDRAMINE HCL 50 MG/ML IJ SOLN
12.5000 mg | Freq: Four times a day (QID) | INTRAMUSCULAR | Status: DC | PRN
  Administered 2015-02-28: 25 mg via INTRAVENOUS
  Filled 2015-02-22: qty 1

## 2015-02-22 MED ORDER — LACTATED RINGERS IV SOLN
INTRAVENOUS | Status: DC | PRN
  Administered 2015-02-22: 12:00:00 via INTRAVENOUS

## 2015-02-22 MED ORDER — SUGAMMADEX SODIUM 200 MG/2ML IV SOLN
INTRAVENOUS | Status: DC | PRN
  Administered 2015-02-22: 200 mg via INTRAVENOUS

## 2015-02-22 MED ORDER — MAGIC MOUTHWASH
15.0000 mL | Freq: Four times a day (QID) | ORAL | Status: DC
  Administered 2015-02-23 – 2015-02-27 (×7): 15 mL via ORAL
  Filled 2015-02-22 (×25): qty 15

## 2015-02-22 MED ORDER — BUPIVACAINE-EPINEPHRINE (PF) 0.25% -1:200000 IJ SOLN
INTRAMUSCULAR | Status: AC
  Filled 2015-02-22: qty 30

## 2015-02-22 MED ORDER — HYDROMORPHONE HCL 1 MG/ML IJ SOLN
1.0000 mg | INTRAMUSCULAR | Status: DC | PRN
  Administered 2015-02-23: 2 mg via INTRAVENOUS
  Administered 2015-02-23: 1 mg via INTRAVENOUS
  Filled 2015-02-22 (×2): qty 2

## 2015-02-22 MED ORDER — ROCURONIUM BROMIDE 100 MG/10ML IV SOLN
INTRAVENOUS | Status: AC
  Filled 2015-02-22: qty 1

## 2015-02-22 MED ORDER — HYDROMORPHONE HCL 1 MG/ML IJ SOLN: 0.5000 mg | INTRAMUSCULAR | Status: DC | PRN

## 2015-02-22 MED ORDER — PANTOPRAZOLE SODIUM 40 MG PO TBEC
80.0000 mg | DELAYED_RELEASE_TABLET | Freq: Two times a day (BID) | ORAL | Status: DC
  Administered 2015-02-23 – 2015-02-26 (×8): 80 mg via ORAL
  Filled 2015-02-22 (×10): qty 2

## 2015-02-22 MED ORDER — LACTATED RINGERS IV BOLUS (SEPSIS): 1000.0000 mL | Freq: Three times a day (TID) | INTRAVENOUS | Status: AC | PRN

## 2015-02-22 MED ORDER — ACETAMINOPHEN 10 MG/ML IV SOLN
INTRAVENOUS | Status: DC | PRN
  Administered 2015-02-22: 1000 mg via INTRAVENOUS

## 2015-02-22 MED ORDER — LIP MEDEX EX OINT
1.0000 "application " | TOPICAL_OINTMENT | Freq: Two times a day (BID) | CUTANEOUS | Status: DC
  Administered 2015-02-23 – 2015-02-28 (×9): 1 via TOPICAL
  Filled 2015-02-22: qty 7

## 2015-02-22 MED ORDER — ALBUMIN HUMAN 5 % IV SOLN
INTRAVENOUS | Status: AC
  Filled 2015-02-22: qty 250

## 2015-02-22 MED ORDER — LACTATED RINGERS IV SOLN: INTRAVENOUS | Status: DC

## 2015-02-22 MED ORDER — LIDOCAINE HCL (CARDIAC) 20 MG/ML IV SOLN
INTRAVENOUS | Status: DC | PRN
  Administered 2015-02-22: 75 mg via INTRAVENOUS
  Administered 2015-02-22: 25 mg via INTRATRACHEAL

## 2015-02-22 MED ORDER — DEXTROSE 5 % IV SOLN
2.0000 g | INTRAVENOUS | Status: DC | PRN
  Administered 2015-02-22: 2 g via INTRAVENOUS

## 2015-02-22 MED ORDER — DIPHENHYDRAMINE HCL 12.5 MG/5ML PO ELIX: 12.5000 mg | ORAL_SOLUTION | Freq: Four times a day (QID) | ORAL | Status: DC | PRN

## 2015-02-22 MED ORDER — ONDANSETRON HCL 4 MG/2ML IJ SOLN
INTRAMUSCULAR | Status: AC
  Filled 2015-02-22: qty 2

## 2015-02-22 MED ORDER — ONDANSETRON HCL 4 MG/2ML IJ SOLN: 4.0000 mg | Freq: Once | INTRAMUSCULAR | Status: DC | PRN

## 2015-02-22 MED ORDER — ACETAMINOPHEN 650 MG RE SUPP: 650.0000 mg | Freq: Four times a day (QID) | RECTAL | Status: DC | PRN

## 2015-02-22 MED ORDER — SUGAMMADEX SODIUM 200 MG/2ML IV SOLN
INTRAVENOUS | Status: AC
  Filled 2015-02-22: qty 2

## 2015-02-22 MED ORDER — LACTATED RINGERS IV SOLN
INTRAVENOUS | Status: DC | PRN
  Administered 2015-02-22 (×3): via INTRAVENOUS

## 2015-02-22 MED ORDER — HYDROMORPHONE 1 MG/ML IV SOLN
INTRAVENOUS | Status: DC
  Administered 2015-02-23: 2.2 mg via INTRAVENOUS
  Administered 2015-02-23 (×2): 0.6 mg via INTRAVENOUS
  Administered 2015-02-23: 2.4 mg via INTRAVENOUS
  Administered 2015-02-23: 0.6 mg via INTRAVENOUS
  Administered 2015-02-23: 2.7 mg via INTRAVENOUS
  Administered 2015-02-24: 0.6 mg via INTRAVENOUS
  Administered 2015-02-24: 1.2 mg via INTRAVENOUS
  Administered 2015-02-24: 0.9 mg via INTRAVENOUS
  Administered 2015-02-24: 0.3 mg via INTRAVENOUS
  Administered 2015-02-24: 0.6 mg via INTRAVENOUS
  Administered 2015-02-24: 0.9 mg via INTRAVENOUS
  Administered 2015-02-25: 1.5 mg via INTRAVENOUS
  Administered 2015-02-25: 0.9 mg via INTRAVENOUS
  Filled 2015-02-22: qty 25

## 2015-02-22 MED ORDER — FENTANYL CITRATE (PF) 250 MCG/5ML IJ SOLN
INTRAMUSCULAR | Status: AC
Start: 2015-02-22 — End: 2015-02-22
  Filled 2015-02-22: qty 5

## 2015-02-22 MED ORDER — CALCIUM CHLORIDE 10 % IV SOLN
1.0000 g | Freq: Once | INTRAVENOUS | Status: AC
  Administered 2015-02-22 (×4): 25 mg via INTRAVENOUS
  Filled 2015-02-22: qty 10

## 2015-02-22 MED ORDER — SODIUM CHLORIDE 0.9 % IJ SOLN
INTRAMUSCULAR | Status: DC | PRN
  Administered 2015-02-22: 20 mL

## 2015-02-22 MED ORDER — OCUVITE PO TABS
1.0000 | ORAL_TABLET | Freq: Every day | ORAL | Status: DC
  Administered 2015-02-23 – 2015-02-26 (×4): 1 via ORAL
  Filled 2015-02-22 (×6): qty 1

## 2015-02-22 MED ORDER — CETYLPYRIDINIUM CHLORIDE 0.05 % MT LIQD
7.0000 mL | Freq: Two times a day (BID) | OROMUCOSAL | Status: DC
  Administered 2015-02-22 – 2015-02-26 (×7): 7 mL via OROMUCOSAL

## 2015-02-22 MED ORDER — FENTANYL 50 MCG/HR TD PT72: 50.0000 ug | MEDICATED_PATCH | TRANSDERMAL | Status: DC

## 2015-02-22 MED ORDER — BUPIVACAINE-EPINEPHRINE 0.25% -1:200000 IJ SOLN
INTRAMUSCULAR | Status: DC | PRN
  Administered 2015-02-22: 15 mL

## 2015-02-22 MED ORDER — PROPOFOL 10 MG/ML IV BOLUS
INTRAVENOUS | Status: DC | PRN
  Administered 2015-02-22: 100 mg via INTRAVENOUS

## 2015-02-22 MED ORDER — PHENYLEPHRINE HCL 10 MG/ML IJ SOLN
10.0000 mg | INTRAVENOUS | Status: DC | PRN
  Administered 2015-02-22: 30 ug/min via INTRAVENOUS
  Administered 2015-02-22: 40 ug/min via INTRAVENOUS
  Administered 2015-02-22: 30 ug/min via INTRAVENOUS

## 2015-02-22 MED ORDER — NYSTATIN 100000 UNIT/ML MT SUSP
5.0000 mL | Freq: Four times a day (QID) | OROMUCOSAL | Status: DC
  Administered 2015-02-22 – 2015-02-26 (×11): 500000 [IU] via ORAL
  Filled 2015-02-22 (×28): qty 5

## 2015-02-22 MED ORDER — HYDROMORPHONE HCL 1 MG/ML IJ SOLN
1.0000 mg | INTRAMUSCULAR | Status: DC | PRN
  Administered 2015-02-22: 1 mg via INTRAVENOUS
  Filled 2015-02-22: qty 1

## 2015-02-22 MED ORDER — PROPOFOL 10 MG/ML IV BOLUS
INTRAVENOUS | Status: AC
  Filled 2015-02-22: qty 20

## 2015-02-22 MED ORDER — HEMOSTATIC AGENTS (NO CHARGE) OPTIME
TOPICAL | Status: DC | PRN
  Administered 2015-02-22: 1

## 2015-02-22 MED ORDER — SODIUM CHLORIDE 0.9% FLUSH: 9.0000 mL | INTRAVENOUS | Status: DC | PRN

## 2015-02-22 MED ORDER — HYDROMORPHONE HCL 2 MG/ML IJ SOLN
INTRAMUSCULAR | Status: AC
  Filled 2015-02-22: qty 1

## 2015-02-22 MED ORDER — MORPHINE SULFATE 2 MG/ML IV SOLN: INTRAVENOUS | Status: DC

## 2015-02-22 MED ORDER — DEXAMETHASONE SODIUM PHOSPHATE 10 MG/ML IJ SOLN
INTRAMUSCULAR | Status: AC
  Filled 2015-02-22: qty 1

## 2015-02-22 MED ORDER — METOPROLOL TARTRATE 1 MG/ML IV SOLN: 5.0000 mg | Freq: Four times a day (QID) | INTRAVENOUS | Status: DC | PRN

## 2015-02-22 MED ORDER — PROMETHAZINE HCL 25 MG/ML IJ SOLN
6.2500 mg | INTRAMUSCULAR | Status: DC | PRN
Start: 2015-02-22 — End: 2015-02-23
  Filled 2015-02-22: qty 1

## 2015-02-22 MED ORDER — CEFOTETAN DISODIUM-DEXTROSE 2-2.08 GM-% IV SOLR
INTRAVENOUS | Status: AC
  Filled 2015-02-22: qty 50

## 2015-02-22 MED ORDER — FENTANYL CITRATE (PF) 250 MCG/5ML IJ SOLN
INTRAMUSCULAR | Status: AC
  Filled 2015-02-22: qty 5

## 2015-02-22 MED ORDER — ALBUMIN HUMAN 5 % IV SOLN
12.5000 g | Freq: Once | INTRAVENOUS | Status: AC
  Administered 2015-02-22: 14:00:00 via INTRAVENOUS

## 2015-02-22 MED ORDER — SODIUM CHLORIDE 0.9 % IJ SOLN
INTRAMUSCULAR | Status: AC
  Filled 2015-02-22: qty 50

## 2015-02-22 SURGICAL SUPPLY — 73 items
APL SRG 38 LTWT LNG FL B (MISCELLANEOUS) ×1
APPLICATOR ARISTA FLEXITIP XL (MISCELLANEOUS) ×3 IMPLANT
APPLIER CLIP 5 13 M/L LIGAMAX5 (MISCELLANEOUS) ×3
APPLIER CLIP ROT 10 11.4 M/L (STAPLE)
APR CLP MED LRG 11.4X10 (STAPLE)
APR CLP MED LRG 5 ANG JAW (MISCELLANEOUS) ×1
BLADE EXTENDED COATED 6.5IN (ELECTRODE) IMPLANT
CABLE HIGH FREQUENCY MONO STRZ (ELECTRODE) ×2 IMPLANT
CELLS DAT CNTRL 66122 CELL SVR (MISCELLANEOUS) ×1 IMPLANT
CLAMP ENDO BABCK 10MM (STAPLE) IMPLANT
CLIP APPLIE 5 13 M/L LIGAMAX5 (MISCELLANEOUS) IMPLANT
CLIP APPLIE ROT 10 11.4 M/L (STAPLE) IMPLANT
CONNECTOR 5 IN 1 STRAIGHT STRL (MISCELLANEOUS) ×3 IMPLANT
COVER SURGICAL LIGHT HANDLE (MISCELLANEOUS) ×6 IMPLANT
DECANTER SPIKE VIAL GLASS SM (MISCELLANEOUS) ×3 IMPLANT
DISSECTOR BLUNT TIP ENDO 5MM (MISCELLANEOUS) ×3 IMPLANT
DRAPE LAPAROSCOPIC ABDOMINAL (DRAPES) ×3 IMPLANT
DRAPE WARM FLUID 44X44 (DRAPE) ×3 IMPLANT
ELECT PENCIL ROCKER SW 15FT (MISCELLANEOUS) ×3 IMPLANT
FILTER SMOKE EVAC LAPAROSHD (FILTER) IMPLANT
GAUZE SPONGE 4X4 12PLY STRL (GAUZE/BANDAGES/DRESSINGS) ×2 IMPLANT
GAUZE SPONGE 4X4 16PLY XRAY LF (GAUZE/BANDAGES/DRESSINGS) ×2 IMPLANT
GLOVE BIOGEL PI IND STRL 7.0 (GLOVE) ×1 IMPLANT
GLOVE BIOGEL PI INDICATOR 7.0 (GLOVE) ×2
GOWN SPEC L4 XLG W/TWL (GOWN DISPOSABLE) ×3 IMPLANT
GOWN STRL REUS W/ TWL XL LVL3 (GOWN DISPOSABLE) ×3 IMPLANT
GOWN STRL REUS W/TWL LRG LVL3 (GOWN DISPOSABLE) ×3 IMPLANT
GOWN STRL REUS W/TWL XL LVL3 (GOWN DISPOSABLE) ×9
HANDLE STAPLE EGIA 4 XL (STAPLE) IMPLANT
HEMOSTAT ARISTA ABSORB 3G PWDR (MISCELLANEOUS) ×2 IMPLANT
HEMOSTAT SURGICEL 4X8 (HEMOSTASIS) ×3 IMPLANT
KIT BASIN OR (CUSTOM PROCEDURE TRAY) ×3 IMPLANT
LIQUID BAND (GAUZE/BANDAGES/DRESSINGS) ×3 IMPLANT
NS IRRIG 1000ML POUR BTL (IV SOLUTION) ×3 IMPLANT
POUCH ENDO CATCH II 15MM (MISCELLANEOUS) IMPLANT
RELOAD STAPLE 60 2.6 WHT THN (STAPLE) IMPLANT
RELOAD STAPLER WHITE 60MM (STAPLE) ×4 IMPLANT
RETRACTOR WND ALEXIS 18 MED (MISCELLANEOUS) IMPLANT
RTRCTR WOUND ALEXIS 18CM MED (MISCELLANEOUS) ×3
SCISSORS LAP 5X35 DISP (ENDOMECHANICALS) ×3 IMPLANT
SET IRRIG TUBING LAPAROSCOPIC (IRRIGATION / IRRIGATOR) ×3 IMPLANT
SHEARS HARMONIC ACE PLUS 36CM (ENDOMECHANICALS) ×3 IMPLANT
SLEEVE ADV FIXATION 5X100MM (TROCAR) ×6 IMPLANT
SOLUTION ANTI FOG 6CC (MISCELLANEOUS) ×3 IMPLANT
SPONGE LAP 18X18 X RAY DECT (DISPOSABLE) ×3 IMPLANT
STAPLE ECHEON FLEX 60 POW ENDO (STAPLE) ×2 IMPLANT
STAPLER RELOAD WHITE 60MM (STAPLE) ×12
STAPLER VISISTAT 35W (STAPLE) ×3 IMPLANT
SUCTION POOLE TIP (SUCTIONS) ×3 IMPLANT
SUT ETHILON 2 0 PS N (SUTURE) ×2 IMPLANT
SUT MNCRL AB 4-0 PS2 18 (SUTURE) ×4 IMPLANT
SUT PDS AB 1 CTX 36 (SUTURE) ×2 IMPLANT
SUT VIC AB 2-0 SH 27 (SUTURE) ×3
SUT VIC AB 2-0 SH 27X BRD (SUTURE) IMPLANT
SUT VIC AB 4-0 SH 18 (SUTURE) ×3 IMPLANT
SYR 30ML LL (SYRINGE) ×3 IMPLANT
SYS LAPSCP GELPORT 120MM (MISCELLANEOUS) ×3
SYSTEM LAPSCP GELPORT 120MM (MISCELLANEOUS) IMPLANT
TAPE CLOTH 4X10 WHT NS (GAUZE/BANDAGES/DRESSINGS) IMPLANT
TAPE CLOTH SURG 6X10 WHT LF (GAUZE/BANDAGES/DRESSINGS) ×2 IMPLANT
TOWEL OR 17X26 10 PK STRL BLUE (TOWEL DISPOSABLE) ×3 IMPLANT
TRAY FOLEY W/METER SILVER 14FR (SET/KITS/TRAYS/PACK) IMPLANT
TRAY FOLEY W/METER SILVER 16FR (SET/KITS/TRAYS/PACK) ×3 IMPLANT
TRAY LAPAROSCOPIC (CUSTOM PROCEDURE TRAY) ×3 IMPLANT
TROCAR ADV FIXATION 5X100MM (TROCAR) ×2 IMPLANT
TROCAR BLADELESS OPT 5 100 (ENDOMECHANICALS) ×3 IMPLANT
TROCAR BLADELESS OPT 5 75 (ENDOMECHANICALS) IMPLANT
TROCAR XCEL 12X100 BLDLESS (ENDOMECHANICALS) ×2 IMPLANT
TROCAR XCEL NON-BLD 11X100MML (ENDOMECHANICALS) IMPLANT
TROCAR XCEL UNIV SLVE 11M 100M (ENDOMECHANICALS) IMPLANT
TUBING FILTER THERMOFLATOR (ELECTROSURGICAL) ×1 IMPLANT
WATER STERILE IRR 1500ML POUR (IV SOLUTION) ×3 IMPLANT
YANKAUER SUCT BULB TIP 10FT TU (MISCELLANEOUS) ×3 IMPLANT

## 2015-02-22 NOTE — Transfer of Care (Signed)
Immediate Anesthesia Transfer of Care Note  Patient: [name]  Procedure(s) Performed: Procedure(s): LAPAROSCOPIC ASSISTED SPLENECTOMY (N/A)  Patient Location: PACU  Anesthesia Type:General  Level of Consciousness: awake, alert , oriented and patient cooperative  Airway & Oxygen Therapy: Patient Spontanous Breathing and Patient connected to face mask oxygen  Post-op Assessment: Report given to RN, Post -op Vital signs reviewed and stable and Patient moving all extremities X 4  Post vital signs: stable  Last Vitals:  Filed Vitals:   02/25/2015 1201 03/09/2015 1202  BP:    Pulse: 93 90  Temp:    Resp:      Complications: No apparent anesthesia complications

## 2015-02-22 NOTE — Anesthesia Procedure Notes (Signed)
Procedure Name: Intubation Date/Time: 03/13/2015 12:25 PM Performed by: Lissa Morales Pre-anesthesia Checklist: Patient identified, Emergency Drugs available, Suction available and Patient being monitored Patient Re-evaluated:Patient Re-evaluated prior to inductionOxygen Delivery Method: Circle System Utilized Preoxygenation: Pre-oxygenation with 100% oxygen Intubation Type: IV induction Ventilation: Mask ventilation without difficulty Laryngoscope Size: Mac and 4 Tube type: Oral Tube size: 8.0 (taper tube) mm Number of attempts: 1 Airway Equipment and Method: Stylet and Oral airway Placement Confirmation: ETT inserted through vocal cords under direct vision,  positive ETCO2 and breath sounds checked- equal and bilateral Secured at: 22 cm Tube secured with: Tape Dental Injury: Teeth and Oropharynx as per pre-operative assessment

## 2015-02-22 NOTE — Anesthesia Postprocedure Evaluation (Signed)
Anesthesia Post Note  Patient: [name]  Procedure(s) Performed: Procedure(s) (LRB): LAPAROSCOPIC ASSISTED SPLENECTOMY (N/A)  Level of consciousness: awake, sedated, patient cooperative and patient uncooperative Pain management: pain level controlled Vital Signs Assessment: post-procedure vital signs reviewed and stable Respiratory status: spontaneous breathing and respiratory function stable Cardiovascular status: blood pressure returned to baseline and stable Anesthetic complications: no    Last Vitals:  Filed Vitals:   02/21/2015 1630 03/03/2015 1645  BP: 109/72   Pulse: 85 78  Temp: 36.8 C   Resp: 15 18    Last Pain:  Filed Vitals:   02/21/2015 1649  PainSc: 0-No pain                 Jamal Pavon EDWARD

## 2015-02-22 NOTE — Addendum Note (Signed)
Addendum  created 03/03/2015 1702 by Lissa Morales, CRNA   Modules edited: Charges VN

## 2015-02-22 NOTE — Progress Notes (Signed)
Nursing Note: Pt up to the bathroom to void and noted belly much more distended and tight than early in the shift.A; Message sent to on-call.

## 2015-02-22 NOTE — Anesthesia Preprocedure Evaluation (Signed)
Anesthesia Evaluation  Patient identified by MRN, date of birth, ID band Patient awake    Reviewed: Allergy & Precautions, NPO status , Patient's Chart, lab work & pertinent test results  History of Anesthesia Complications (+) PONV  Airway Mallampati: II  TM Distance: >3 FB     Dental   Pulmonary COPD, former smoker,    Pulmonary exam normal        Cardiovascular hypertension, + CAD, + Past MI and + Peripheral Vascular Disease  Normal cardiovascular exam     Neuro/Psych Depression    GI/Hepatic PUD, GERD  ,  Endo/Other    Renal/GU      Musculoskeletal  (+) Arthritis ,   Abdominal   Peds  Hematology  (+) anemia ,   Anesthesia Other Findings Splenic Enlargement Myelodysplasia  Reproductive/Obstetrics                             Anesthesia Physical Anesthesia Plan  ASA: III  Anesthesia Plan: General   Post-op Pain Management:    Induction: Intravenous  Airway Management Planned: Oral ETT  Additional Equipment: Arterial line and CVP  Intra-op Plan:   Post-operative Plan: Possible Post-op intubation/ventilation  Informed Consent: I have reviewed the patients History and Physical, chart, labs and discussed the procedure including the risks, benefits and alternatives for the proposed anesthesia with the patient or authorized representative who has indicated his/her understanding and acceptance.     Plan Discussed with: CRNA, Anesthesiologist and Surgeon  Anesthesia Plan Comments:         Anesthesia Quick Evaluation

## 2015-02-22 NOTE — Progress Notes (Signed)
[name] will be going for splenectomy this morning. He is aware of the diagnosis now. He wants his spleen out. It is causing quite a bit of discomfort for him.  He is aware that the only way to treat his leukemia would be with chemotherapy. He may think about "low-dose" chemotherapy. We sternly have this as an option. It will not rid of the leukemia but at least controlled so maybe he will not need to be transfuses much.  He was given some blood yesterday. His hemoglobin is 7.7. Platelet count 15,000. I'm surprised by this area did I thought he wouldn't have much more of a "bump". He definitely will need platelets during the procedure.  His attitude is quite good.  He's had no fever. He's had no obvious bleeding. He's had no off shortness of breath.  Pain medication seems to be doing fairly well for him right now.  We will see how he does with his splenectomy. He will be going to the ICU after his surgery.  We will pray hard for him. He is in excellent hands with Dr. Lucia Gaskins.  [name] E.  Romans 8:28

## 2015-02-22 NOTE — Progress Notes (Signed)
Took over patinet for nightshift, when reviewing orders noticed sign and held orders that had been on the chart for over a month, prior to this admit and other orders prior to surgery.  I also noticed pt had three different primary fluids ordered and had NS @ 75 and LR infusing at 125.  I also had PCA order set in place but no PCA meds, just Dilaudid IVP.  Also was advised during report that the patient had already received 3 unit of pRBCs and 1 unit of Platlets and was to receive a second unit of platletsm did not see an order for the other unit of platlets.  Due to the orders not being clear, paged Dr. Johney Maine.  I advised him of the primary situation.  He had seen some of the orders that were discontinued by me that were on the chart for 1-2 months ago.  He advised he would look at the chart and fix the orders.  [facility] ICU/SD Unit

## 2015-02-22 NOTE — Progress Notes (Signed)
[facility] Progress Note   [name]  VPX:[id number]  DOB: March 02, 1945  DOA: 02/27/2015 PCP: Shirline Frees, MD  Brief narrative: [name] is a 70 y.o. male with myelodysplastic syndrome and resultant pancytopenia who presents to the hospital for left flank and left upper abdominal pain. He is found to have significant splenomegaly and is noted to have transition of myelodysplastic syndrome to AML. It was recommended that I oncology he have a splenectomy. He has been receiving multiple transfusions for his pancytopenia.   Subjective: Waiting surgery today. He has no specific complaints such as nausea or vomiting. He does have some abdominal discomfort due to distention. No cough or shortness of breath. He has thrush and does admit to sore throat and pain when swallowing.  Assessment/Plan: Principal Problem:   AML (acute myeloid leukemia) with splenomegaly, pancytopenia and acquired coagulopathy with elevated INR -MDS has converted to AML per CT guided bone marrow biopsy that was performed on 02/17/15 -Oncology plans on palliative chemotherapy - Will undergo splenectomy today -Continue supportive transfusions- has received numerous red blood cell, FFP and platelet transfusions -Received vitamin K for elevated INR on 2/72 -No obvious bleeding at this time - received pneumococcal/meningococcal/Haemophilus vaccines  Active Problems: Thrush -Continues to have white patches on his tongue and a sore throat -Have added nystatin-continue Diflucan  Coronary atherosclerosis of native coronary artery  Hyperlipidemia -On simvastatin  History of DVT -Currently has an IVC filter as we are unable to anticoagulate in setting of thrombocytopenia and coagulopathy   Antibiotics: Anti-infectives    Start     Dose/Rate Route Frequency Ordered Stop   02/19/15 1000  fluconazole (DIFLUCAN) tablet 200 mg     200 mg Oral Daily 02/19/15 0850 02/23/15 0959     Code Status:      Code Status Orders        Start     Ordered   02/24/2015 1826  Full code   Continuous     02/28/2015 1825    Code Status History    Date Active Date Inactive Code Status Order ID Comments User Context   11/02/2014  4:34 AM 11/08/2014  6:12 PM Full Code [id number]  Ivor Costa, MD ED   10/11/2014  1:20 PM 10/17/2014  5:19 PM Full Code [id number]  Lodema Hong [name], PA-C Inpatient   10/09/2014  9:23 PM 10/11/2014  1:20 PM Full Code [id number]  Ivin Poot, MD ED   09/18/2014  1:22 PM 09/20/2014  7:56 PM Full Code [id number]  Jacqulynn Cadet, MD Inpatient   09/14/2014  5:27 PM 09/18/2014  1:22 PM Full Code [id number]  Kelvin Cellar, MD Inpatient   04/01/2014 10:56 AM 04/01/2014  9:29 PM Full Code [id number]  Jacqulynn Cadet, MD Inpatient   03/30/2014  8:52 PM 04/01/2014 10:56 AM Full Code [id number]  Deneise Lever, MD ED   08/04/2013  9:37 AM 08/06/2013  7:19 PM Full Code [id number]  Nita Sells, MD Inpatient   07/02/2013  2:19 PM 07/03/2013  7:04 PM Full Code [id number]  Ulyses Amor, PA-C Inpatient   06/13/2013  7:57 PM 06/21/2013  8:52 PM Full Code [id number]  Otho Bellows, MD Inpatient   06/05/2013  6:39 PM 06/12/2013  5:29 PM Full Code [id number]  Clinton Gallant, MD Inpatient    Advance Directive Documentation        Most Recent Value   Type of Advance Directive  Living will, Healthcare Power of Attorney   Pre-existing out of facility DNR order (  yellow form or pink MOST form)     "MOST" Form in Place?       Family Communication:  Disposition Plan: splenectomy today- uncertain when will be discharged DVT prophylaxis: SCDs due to severe thrombocytopenia and elevated INR Consultants: oncology, gen surgery Procedures: Bone marrow biopsy, splenectomy    Objective: Filed Weights   03/04/2015 1845  Weight: 74 kg (163 lb 2.3 oz)    Intake/Output Summary (Last 24 hours) at 02/19/2015 1553 Last data filed at 03/05/2015 1445  Gross per 24 hour  Intake   6138 ml  Output   1650 ml  Net   4488 ml      Vitals Filed Vitals:   03/05/2015 1530 03/04/2015 1533 03/12/2015 1543 02/15/2015 1545  BP:  110/70 114/66 106/74  Pulse: 99 98 95 97  Temp:   99.2 F (37.3 C)   TempSrc:      Resp: '16 11 11 9  '$ Height:      Weight:      SpO2: 100% 100% 100% 100%    Exam:  General:  Pt is alert, not in acute distress  HEENT: No icterus, No thrush, oral mucosa moist  Cardiovascular: regular rate and rhythm, S1/S2 No murmur  Respiratory: clear to auscultation bilaterally   Abdomen: Soft, +Bowel sounds, non tender, non distended, no guarding  MSK: No cyanosis or clubbing- no pedal edema   Data Reviewed: Basic Metabolic Panel:  Recent Labs Lab 02/17/15 0635 02/18/15 0520 02/20/15 0500 02/21/15 0528 03/02/2015 0545  NA 140 136 142 143 142  K 4.0 4.1 4.1 4.0 3.9  CL 106 103 106 109 107  CO2 '25 24 26 22 25  '$ GLUCOSE 116* 119* 128* 126* 113*  BUN 22* 23* 27* 27* 27*  CREATININE 1.04 1.21 1.23 1.52* 1.18  CALCIUM 8.8* 8.4* 8.5* 8.4* 7.9*   Liver Function Tests:  Recent Labs Lab 02/25/2015 1221 02/17/15 0635  AST 10* 10*  ALT 9* 9*  ALKPHOS 68 68  BILITOT 1.0 1.3*  PROT 7.1 7.1  ALBUMIN 4.0 3.8   No results for input(s): LIPASE, AMYLASE in the last 168 hours. No results for input(s): AMMONIA in the last 168 hours. CBC:  Recent Labs Lab 03/04/2015 1221  02/18/15 0520 02/19/15 0540 02/20/15 0500 02/21/15 0528 02/28/2015 0545  WBC 3.4*  < > 4.2 3.5* 3.6* 4.5 4.3  NEUTROABS 1.3*  --   --  0.9* 1.0* 1.4* 1.9  HGB 7.1*  < > 8.3* 7.2* 7.9* 7.3* 7.7*  HCT 21.5*  < > 25.4* 22.4* 23.9* 22.9* 23.1*  MCV 81.7  < > 82.7 85.2 83.0 85.4 83.4  PLT 37*  < > 33* 27* 19* 17* 15*  < > = values in this interval not displayed. Cardiac Enzymes: No results for input(s): CKTOTAL, CKMB, CKMBINDEX, TROPONINI in the last 168 hours. BNP (last 3 results) No results for input(s): BNP in the last 8760 hours.  ProBNP (last 3 results) No results for input(s): PROBNP in the last 8760  hours.  CBG:  Recent Labs Lab 03/07/2015 2116 02/17/15 0738 02/17/15 1207 02/17/15 1708  GLUCAP 98 102* 109* 109*    Recent Results (from the past 240 hour(s))  Surgical pcr screen     Status: None   Collection Time: 02/21/15  8:58 PM  Result Value Ref Range Status   MRSA, PCR NEGATIVE NEGATIVE Final   Staphylococcus aureus NEGATIVE NEGATIVE Final    Comment:        The Xpert SA Assay (FDA approved  for NASAL specimens in patients over 46 years of age), is one component of a comprehensive surveillance program.  Test performance has been validated by [facility] for patients greater than or equal to 35 year old. It is not intended to diagnose infection nor to guide or monitor treatment.      Studies: Dg Abd Portable 2v  03/12/2015  CLINICAL DATA:  Abdominal discomfort and distention EXAM: PORTABLE ABDOMEN - 2 VIEW COMPARISON:  CT abdomen and pelvis February 16, 2015 FINDINGS: Supine and left lateral decubitus images were obtained. There is no appreciable bowel dilatation. There are several air-fluid levels on the decubitus image. No free air is evident. There is a filter in the inferior vena cava with the apex of the filter at L1-2. There are surgical clips in the right upper quadrant. There is a stent in the aorta and both common iliac artery regions. IMPRESSION: No bowel dilatation. Scattered air-fluid levels are present. Suspect early ileus or a degree of enteritis. Obstruction is not appear likely. No free air. Inferior vena cava filter present. Electronically Signed   By: Lowella Grip III M.D.   On: 02/26/2015 08:47    Scheduled Meds:  Scheduled Meds: . [MAR Hold] sodium chloride   Intravenous Once  . sodium chloride   Intravenous Once  . acetaminophen  1,000 mg Intravenous Q6H  . [MAR Hold] belladonna-PHENObarbital  5 mL Oral TID  . calcium chloride  1 g Intravenous Once  . [MAR Hold] feeding supplement (ENSURE ENLIVE)  237 mL Oral BID BM  . [MAR Hold] fentaNYL   25 mcg Transdermal Q72H  . fluconazole  200 mg Oral Daily  . [MAR Hold] fluticasone  2 spray Each Nare Daily  . [MAR Hold] loratadine  10 mg Oral Daily  . [MAR Hold] magic mouthwash  5 mL Oral QID  . morphine   Intravenous 6 times per day  . [MAR Hold] multivitamin  1 tablet Oral BID  . [MAR Hold] nystatin  5 mL Oral QID  . [MAR Hold] pantoprazole  40 mg Oral BID  . [MAR Hold] polyethylene glycol  17 g Oral BID  . [MAR Hold] senna-docusate  2 tablet Oral BID  . [MAR Hold] simvastatin  20 mg Oral QHS   Continuous Infusions: . sodium chloride 75 mL/hr at 02/17/2015 0946    Time spent on care of this patient: 35 min   [place], MD 03/08/2015, 3:53 PM  LOS: 6 days   [facility] Office  [phone] Pager - Text Page per [email or web address] If 7PM-7AM, please contact night-coverage [email or web address]

## 2015-02-23 ENCOUNTER — Encounter (HOSPITAL_COMMUNITY): Payer: Self-pay | Admitting: Surgery

## 2015-02-23 DIAGNOSIS — Z9081 Acquired absence of spleen: Secondary | ICD-10-CM

## 2015-02-23 LAB — CBC WITH DIFFERENTIAL/PLATELET
BAND NEUTROPHILS: 0 %
BASOS PCT: 0 %
Basophils Absolute: 0 10*3/uL (ref 0.0–0.1)
Blasts: 8 %
EOS ABS: 0 10*3/uL (ref 0.0–0.7)
EOS PCT: 0 %
HCT: 35.4 % — ABNORMAL LOW (ref 39.0–52.0)
Hemoglobin: 11.7 g/dL — ABNORMAL LOW (ref 13.0–17.0)
LYMPHS ABS: 1.3 10*3/uL (ref 0.7–4.0)
Lymphocytes Relative: 7 %
MCH: 28.5 pg (ref 26.0–34.0)
MCHC: 33.1 g/dL (ref 30.0–36.0)
MCV: 86.3 fL (ref 78.0–100.0)
MONO ABS: 8.6 10*3/uL — AB (ref 0.1–1.0)
MYELOCYTES: 0 %
Metamyelocytes Relative: 0 %
Monocytes Relative: 46 %
NRBC: 0 /100{WBCs}
Neutro Abs: 7.3 10*3/uL (ref 1.7–7.7)
Neutrophils Relative %: 39 %
OTHER: 0 %
PLATELETS: 29 10*3/uL — AB (ref 150–400)
PROMYELOCYTES ABS: 0 %
RBC: 4.1 MIL/uL — ABNORMAL LOW (ref 4.22–5.81)
RDW: 15.7 % — AB (ref 11.5–15.5)
WBC: 18.7 10*3/uL — ABNORMAL HIGH (ref 4.0–10.5)

## 2015-02-23 LAB — PREPARE PLATELET PHERESIS
UNIT DIVISION: 0
UNIT DIVISION: 0
UNIT DIVISION: 0

## 2015-02-23 LAB — BASIC METABOLIC PANEL
ANION GAP: 10 (ref 5–15)
BUN: 16 mg/dL (ref 6–20)
CHLORIDE: 108 mmol/L (ref 101–111)
CO2: 24 mmol/L (ref 22–32)
Calcium: 7.7 mg/dL — ABNORMAL LOW (ref 8.9–10.3)
Creatinine, Ser: 1.05 mg/dL (ref 0.61–1.24)
GFR calc Af Amer: >60 mL/min (ref >60)
GLUCOSE: 93 mg/dL (ref 65–99)
POTASSIUM: 4 mmol/L (ref 3.5–5.1)
Sodium: 142 mmol/L (ref 135–145)

## 2015-02-23 MED ORDER — SODIUM CHLORIDE 0.9 % IV SOLN
Freq: Once | INTRAVENOUS | Status: AC
  Administered 2015-02-23: 09:00:00 via INTRAVENOUS

## 2015-02-23 MED ORDER — ONDANSETRON HCL 4 MG/2ML IJ SOLN
4.0000 mg | Freq: Four times a day (QID) | INTRAMUSCULAR | Status: DC | PRN
  Administered 2015-02-23 – 2015-02-26 (×5): 4 mg via INTRAVENOUS
  Filled 2015-02-23 (×5): qty 2

## 2015-02-23 NOTE — Progress Notes (Signed)
Pt states he feels pressure to void but is unable to void at this time. Pt assisted to stand on the side of the bed and approximately 5cc of urine measured in the urinal. Bladder scan shows 419cc. K. Schorr,NP notified and order for I&O cath obtained. 350cc of amber colored urine with some sediment returned. Pt tolerated well.

## 2015-02-23 NOTE — Progress Notes (Signed)
[name] has had successful surgery. He underwent splenectomy yesterday. Dr. Lucia Gaskins in today fantastic job. There were no complications. He had no bleeding. He was given some blood products prior to the procedure.  He is now down in the ICU. There's been no bleeding. He does have some abdominal distention which is probably from the air that was put into the abdomen for the procedure.  His labs shows white cell count be up. This is no surprise. I would expect is wiser, atrial and upward. His hemoglobin is doing adequately well at 11.7. His blood count is 29,000. Hopefully, this will pick up a little bit.  That he has leukemia. We will have to discuss any kind of intervention for this after he gets out of the hospital.  Hopefully, he will improve quickly. Hopefully he'll be able to eat better . I think the real key will be ambulating him.  He's had no nausea or vomiting. Right now, he is just on clear liquids.  His physical exam shows his temperature to be 97.5. Pulse is 79. Blood pressure is 112/61. Abdomen has dressings on it. Abdomen is somewhat distended. I cannot hear much in way of bowel sounds. Lungs are clear. Cardiac exam regular rate and rhythm with no murmurs.  We nice have to await recovery from his splenectomy. I'm sure that the past specimen will show acute myeloid leukemia.  Hopefully he will be out of the ICU in 1-2 days.  I appreciate all the asked any care that he is getting while in the ICU.  [name] E.  1 Corinthians 13:13

## 2015-02-23 NOTE — Progress Notes (Signed)
Date: February 23, 2015 Chart reviewed for concurrent status and case management needs. Will continue to follow patient for changes and needs:  splenectomy on [id number] received bld afterward due to hypotensive and platlets. Velva Harman, BSN, [place], [place]   [phone]

## 2015-02-23 NOTE — Op Note (Signed)
[name], [name] NO.:  [id number]  MEDICAL RECORD NO.:  [id number]  LOCATION:  5409                         [name]:  [name]  PHYSICIAN:  Fenton Malling. Lucia [name], M.D.  DATE OF BIRTH:  11/26/1945  DATE OF PROCEDURE:  02/26/2015                               OPERATIVE REPORT   PREOPERATIVE DIAGNOSIS:  Leukemia and splenomegaly.  POSTOPERATIVE DIAGNOSIS:  Leukemia and splenomegaly.  PROCEDURE:  Laparoscopic-assisted splenectomy.  SURGEON:  Fenton Malling. Lucia [name], M.D.  FIRST ASSISTANT:  [name], M.D.  ANESTHESIA:  General endotracheal, supervised by [name].  ESTIMATED BLOOD LOSS:  750 mL.  BLOOD ADMINISTERED:  He was given 4 units of packed red blood cells, 2 units of platelets and 2 units of fresh-frozen plasma.    DRAINS: He has a 28 [place] Blake drain in the left upper quadrant.    LOCAL:  I used 20 mL of Exparel as a local anesthetic.  The specimen was the spleen.  The lap count was correct.  INDICATION FOR PROCEDURE:  [name] is a 70 year old white male who sees [name], who is his primary medical doctor.    He has had chronic myelodysplasia for some time.  Over the last several months, he has had some fairly rapid enlargement of his spleen and has been taken care by [name].  He underwent a bone marrow biopsy on Friday, February 17, 2015, which revealed leukemic changes in his bone marrow. I have had lengthy discussion in care of the patient, [name], and the family about options of treatment.    He is having significant pain with his rapidly enlarging spleen, he has also been increasingly difficult to transfuse him with blood products such as red blood cells or platelets because of his splenomegaly the presumed consumption of these blood products by the spleen.  So, we discussed about proceeding with splenectomy for these two reasons: pain control and hopefully decreasing the need for transfusion of blood products.  He  understands that there would be probably no impact on his leukemia by doing the splenectomy.  OPERATIVE NOTE:  The patient was taken to Room #6 at [name], [name] had placed a left IJ and a left arterial line on him preoperatively.  He was given 2 g of cefotetan at the beginning of the procedure.  We had blood available, in that, his preop hemoglobin was 7.7, and his preoperative platelets were 15,000, despite giving blood and platelets in the last 24 hours.  He was placed on the beanbag with his left arm on a suspended above his head.  His abdomen was prepped with ChloraPrep and sterilely draped.    A time-out was held and the surgical checklist run.  I started with a 5-mm Hasson OptiVu at the umbilicus and used this for abdominal exploration.  His liver was unremarkable.  His bowel that I could see actually intraabdominal was unremarkable, as was the omentum, but he had a very large dominant spleen in his left upper quadrant.  I placed four additional 5 mm trocars: one in the midline below the left costal margin, one in the medial left costal  margin, one in the lateral left costal margin and once far laterally.  The medial trocar and the far lateral trocar were upsized to 12-mm trocars during the procedure for placing this stapling device and for suction devices.  He had inflammation of the spleen to the diaphragm and into the left anterolateral peritoneal surface, but otherwise, the spleen had minimal adhesions.  He had some omental attachments that were [place] with Harmonic scalpel, but we got to the pedicle of the spleen and thought that I had isolated this fairly well.  I went through the far lateral trocar and used a 60-mm Ethicon Echelon stapler and fired this across the base of the pedicle of the spleen twice.  We could not see the top of the spleen.  He did have some oozing, but I think this was back bleeding from the spleen.  I put in a hand-assist Gel port in the left  subcostal margin.  I was able to put the hand-assist and feel thespleen was disconnected.  I was able to feel there was one additional attachment, so I made a third firing with a 60-mm Echelon stapler and then the spleen was free.  We had an Darrell large bag (obtained from [facility]).  We were able to put the spleen in place and put the spleen into the bag and then morcellated the spleen coming out through his left subcostal incision.  The subcostal incision was about 7 cm in length.  After removed the spleen, I then reinserted the hand-assist port and I irrigated out the blood clots, which were in the abdomen both in the left upper quadrant along the left gutter to the liver and over the right lobe of the liver.  He had a little bit of oozing, but this was more surface oozing from this irritated surface of the diaphragm, then I put both Arista and one piece of Surgicel the raw surface.  I then placed a [name] over this and went to other parts of the abdomen for about 10 minutes, irrigating, cleaning up of the blood clots and came back.  The [name] was only mildly bloody and looked like the bleeding had been controlled. The [name] was then removed.  I was then able to see the greater curve of the stomach.  I could not see that he had any injury to the stomach.  Because of his significant low platelets, we did not leave an NG tube out of this for now.  I did put a 19 Blake drain in the left upper quadrant, this was through the far lateral trocar into the left upper quadrant under the diaphragm.    The left subcostal incision, I closed with two running #1 PDS sutures for the posterior rectus fascia and two #1 PDS sutures for the anterior rectus fascia.  The wounds were infiltrated with 20 mL of Exparel mixed with 20 mL of saline.  I stapled the left subcostal incision.  The trocar sites, of which, they were four, because one was used for drain were closed with a subcuticular 4-0 Monocryl and painted  with tincture of benzoin and Steri-Strips.  The patient tolerated the procedure well.  After completion of division of the spleen, we started giving the patient's both blood transfusions, platelet transfusions and FFP.  He remained stable throughout the procedure.  We will plan to put in the ICU postop because of his significant blood issues.   Fenton Malling. Lucia [name], M.D., FACS, scribe for Epic   DHN/MEDQ  D:  03/12/2015  T:  02/23/2015  Job:  [id number]  cc:   Juanda Bond. Burt Knack, [place], [place] [address]  Denice Bors. Stanford Breed, MD, [facility]  Volanda Napoleon, M.D. Fax: [phone]  Azalia [name], M.D. Fax: [phone]

## 2015-02-23 NOTE — Evaluation (Signed)
Physical Therapy Evaluation Patient Details Name: [name] MRN: [id number] DOB: [birth year] Today's Date: 02/23/2015   History of Present Illness  70 yo male with history of myodysplastic syndrome, S/P spenectomy 02/24/2015.  Clinical Impression  Mr. Hogenson is very motivated to ambulate today. Tolerated 110' x 2 with RW, used 4 l oxygen with sats > 90%, HR 112 max. RR low 30's and pain at 8/10 with use of PCA. Pt admitted with above diagnosis. Pt currently with functional limitations due to the deficits listed below (see PT Problem List). Pt will benefit from skilled PT to increase their independence and safety with mobility to allow discharge to home.     Follow Up Recommendations Home health PT;Supervision/Assistance - 24 hour    Equipment Recommendations  None recommended by PT    Recommendations for Other Services       Precautions / Restrictions Precautions Precaution Comments: L drain, multiple lines, R port, L central line      Mobility  Bed Mobility Overal bed mobility: Needs Assistance Bed Mobility: Rolling;Sidelying to Sit Rolling: Mod assist Sidelying to sit: Mod assist       General bed mobility comments: cues for abdominal protection, extra time , lifrting assist for trunk   Transfers Overall transfer level: Needs assistance Equipment used: Rolling walker (2 wheeled) Transfers: Sit to/from Stand Sit to Stand: Min assist;+2 safety/equipment         General transfer comment: cues for technique  Ambulation/Gait Ambulation/Gait assistance: Min assist;+2 safety/equipment Ambulation Distance (Feet): 110 Feet (x 2.) Assistive device: Rolling walker (2 wheeled) Gait Pattern/deviations: Step-through pattern;Decreased stride length Gait velocity: decreased   General Gait Details: patient manages the Rw well, sats on 4 liters during ambulation> 90%, HR 112 max, RR 30 at times, PCA used x 2 during ambulation.  Stairs            Wheelchair Mobility     Modified Rankin (Stroke Patients Only)       Balance Overall balance assessment: Needs assistance Sitting-balance support: Feet supported Sitting balance-Leahy Scale: Good     Standing balance support: During functional activity;Bilateral upper extremity supported Standing balance-Leahy Scale: Fair                               Pertinent Vitals/Pain Pain Assessment: 0-10 Pain Score: 8  Pain Location: abdomen Pain Descriptors / Indicators: Aching;Cramping Pain Intervention(s): PCA encouraged;Monitored during session    Home Living Family/patient expects to be discharged to:: Private residence   Available Help at Discharge: Family;Available 24 hours/day Type of Home: House Home Access: Stairs to enter Entrance Stairs-Rails: None Entrance Stairs-Number of Steps: 3 Home Layout: One level Home Equipment: Walker - 2 wheels      Prior Function Level of Independence: Independent               Hand Dominance        Extremity/Trunk Assessment   Upper Extremity Assessment: Generalized weakness           Lower Extremity Assessment: Generalized weakness      Cervical / Trunk Assessment: Other exceptions  Communication   Communication: No difficulties  Cognition Arousal/Alertness: Awake/alert Behavior During Therapy: Flat affect Overall Cognitive Status: Within Functional Limits for tasks assessed                      General Comments      Exercises  Assessment/Plan    PT Assessment Patient needs continued PT services  PT Diagnosis Difficulty walking;Generalized weakness;Acute pain   PT Problem List Decreased strength;Decreased activity tolerance;Decreased mobility;Decreased knowledge of use of DME;Decreased knowledge of precautions;Decreased safety awareness;Pain  PT Treatment Interventions DME instruction;Gait training;Functional mobility training;Therapeutic activities;Therapeutic exercise;Patient/family education    PT Goals (Current goals can be found in the Care Plan section) Acute Rehab PT Goals Patient Stated Goal: to walk PT Goal Formulation: With patient Time For Goal Achievement: 03/09/15 Potential to Achieve Goals: Good    Frequency Min 3X/week   Barriers to discharge        Co-evaluation               End of Session   Activity Tolerance: Patient tolerated treatment well Patient left: in chair;with call bell/phone within reach;with chair alarm set Nurse Communication: Mobility status         Time: [id number] PT Time Calculation (min) (ACUTE ONLY): 26 min   Charges:   PT Evaluation $PT Eval Moderate Complexity: 1 Procedure PT Treatments $Gait Training: 8-22 mins   PT G Codes:        Claretha Cooper 02/23/2015, 10:49 AM Tresa Endo PT [phone]

## 2015-02-23 NOTE — Progress Notes (Signed)
[facility]:  [phone] General Surgery Progress Note   LOS: 7 days  POD -  1 Day Post-Op PCP - Dr. Tomasita Crumble   Assessment/Plan: 1.  Laparoscopic assisted splenectomy - 03/07/2015 - D. Delwyn Scoggin  For splenomegaly and blood issues  He looks good first post op day - He did not get the platelets I talked to the nurse about last PM   2.  Myelodysplastic syndrome  Bone marrow biopsy (02/17/2015) ---> acute myeloid leukemia   3a.  Thrombocytopenia  Plt count - 29,000 - 02/23/2015 3b. Anemia  Hgb 11.7 - 02/23/2015  Transfuse 2 units today  4.  CAD  Inferior MI in 2010.  Sees Dr. Burt Knack  Last Echo - 03/31/2014 - EF - 55%  Dr. Stanford Breed has seen during this admission 5.  History of VATS 6.  History of endovascular stent for AAA    7.  History of IVC filter for DVT 8.  DVT prophylaxis - on hold for thrombocytopenia 9.  History of "dilaudid withdrawal" when he had his chest surgery. 10.  To remove foley.   Principal Problem:   AML (acute myeloid leukemia) (HCC) Active Problems:   Essential hypertension   Coronary atherosclerosis of native coronary artery   Pancytopenia (HCC)   MDS (myelodysplastic syndrome), high grade (HCC)   Abdominal pain   Splenomegaly   Absolute anemia   Preop cardiovascular exam   Pressure ulcer   Status post laparoscopic splenectomy   Subjective:  In ICU, but overall looks good.  Not too bad of pain at this time.    Objective:   Filed Vitals:   02/23/15 0600 02/23/15 0700  BP: 112/61   Pulse: 79   Temp:  98.6 F (37 C)  Resp: 7      Intake/Output from previous day:  02/08 0701 - 02/09 0700 In: 8407 [I.V.:5500; Blood:2132; IV Piggyback:775] Out: 2790 [Urine:1470; Drains:520; Blood:800]  Intake/Output this shift:      Physical Exam:   General: Thin older WM who is alert.    HEENT: Normal. Pupils equal.  Left IJ .   Lungs: Clear, though inspiratory effort is not great.    Cards:  3/6 systolic murmur   Abdomen: Mild  distention.     Wounds:  Look good.  Some drainage around the JP.   JP - 520 cc recorded last 24 hours.  (much of this may be irrigation from surgery)   Lab Results:     Recent Labs  03/07/2015 1544 02/23/15 0307  WBC 6.5 18.7*  HGB 10.5* 11.7*  HCT 30.4* 35.4*  PLT 38* 29*    BMET    Recent Labs  02/18/2015 1544 02/23/15 0307  NA 140 142  K 3.7 4.0  CL 108 108  CO2 23 24  GLUCOSE 155* 93  BUN 18 16  CREATININE 1.12 1.05  CALCIUM 7.4* 7.7*    PT/INR    Recent Labs  02/21/15 0750  LABPROT 18.5*  INR 1.54*    ABG  No results for input(s): PHART, HCO3 in the last 72 hours.  Invalid input(s): PCO2, PO2   Studies/Results:  Dg Abd Portable 2v  02/28/2015  CLINICAL DATA:  Abdominal discomfort and distention EXAM: PORTABLE ABDOMEN - 2 VIEW COMPARISON:  CT abdomen and pelvis February 16, 2015 FINDINGS: Supine and left lateral decubitus images were obtained. There is no appreciable bowel dilatation. There are several air-fluid levels on the decubitus image. No free air is evident. There is a filter in the  inferior vena cava with the apex of the filter at L1-2. There are surgical clips in the right upper quadrant. There is a stent in the aorta and both common iliac artery regions. IMPRESSION: No bowel dilatation. Scattered air-fluid levels are present. Suspect early ileus or a degree of enteritis. Obstruction is not appear likely. No free air. Inferior vena cava filter present. Electronically Signed   By: Lowella Grip III M.D.   On: 02/16/2015 08:47     Anti-infectives:   Anti-infectives    Start     Dose/Rate Route Frequency Ordered Stop   03/10/2015 2200  fluconazole (DIFLUCAN) IVPB 200 mg    Comments:  May convert to PO if patient taking SOLID food well   200 mg 100 mL/hr over 60 Minutes Intravenous Every 24 hours 02/24/2015 2154 02/27/15 2159   02/19/15 1000  fluconazole (DIFLUCAN) tablet 200 mg  Status:  Discontinued     200 mg Oral Daily 02/19/15 0850 02/28/2015 2154       Alphonsa Overall, MD, FACS Pager: [facility]: [phone] 02/23/2015

## 2015-02-23 NOTE — Clinical Documentation Improvement (Signed)
Internal Medicine  Can the diagnosis of pressure ulcer be further specified?   Document if pressure ulcer with stage is Present on Admission   Document Site with laterality - Elbow, Back (upper/lower), Sacral, Hip, Buttock, Ankle, Heel, Head, Other (Specify)  Pressure Ulcer Stage - Stage1, Stage 2, Stage 3, Stage 4, Unstageable, Unspecified, Unable to Clinically Determine  Document any associated diagnoses/conditions such as: with gangrene  Other  Clinically Undetermined  Supporting Information: 03/02/2015- Added to problem list by Dr. Debbe Odea. 02/23/15- Included in Problem List from General Surgery  Please exercise your independent, professional judgment when responding. A specific answer is not anticipated or expected.   Thank You,  Rolm Gala, RN, [place] [phone]

## 2015-02-23 NOTE — Progress Notes (Addendum)
[facility] Progress Note   [name]  ZSW:[id number]  DOB: [birth year]  DOA: 03/08/2015 PCP: Shirline Frees, MD  Brief narrative: LEEVON [name] is a 70 y.o. male with myelodysplastic syndrome and resultant pancytopenia who presents to the hospital for left flank and left upper abdominal pain. He is found to have significant splenomegaly and is noted to have transition of myelodysplastic syndrome to AML. It was recommended that I oncology he have a splenectomy. He has been receiving multiple transfusions for his pancytopenia.   Subjective: No complaints. Specifically no nausea, vomiting or abdominal pain.   Assessment/Plan: Principal Problem:   AML (acute myeloid leukemia) with splenomegaly, pancytopenia and acquired coagulopathy with elevated INR - MDS has converted to AML per CT guided bone marrow biopsy that was performed on 02/17/15 - Oncology plans on palliative chemotherapy - Will undergo splenectomy today - Continue supportive transfusions- has received numerous red blood cell, FFP and platelet transfusions - Received vitamin K for elevated INR on 2/72 - received pneumococcal/meningococcal/Haemophilus vaccines - have transfused another unit of platelets this AM as platelet count is 29  Active Problems:  Thrush -Continues to have white patches on his tongue and a sore throat -Have added nystatin-continue Diflucan  Coronary atherosclerosis of native coronary artery  Hyperlipidemia -On simvastatin  History of DVT -Currently has an IVC filter as we are unable to anticoagulate in setting of thrombocytopenia and coagulopathy   Antibiotics: Anti-infectives    Start     Dose/Rate Route Frequency Ordered Stop   02/17/2015 2200  fluconazole (DIFLUCAN) IVPB 200 mg    Comments:  May convert to PO if patient taking SOLID food well   200 mg 100 mL/hr over 60 Minutes Intravenous Every 24 hours 02/19/2015 2154 02/27/15 2159   02/19/15 1000  fluconazole (DIFLUCAN) tablet  200 mg  Status:  Discontinued     200 mg Oral Daily 02/19/15 0850 02/21/2015 2154     Code Status:     Code Status Orders        Start     Ordered   02/28/2015 1826  Full code   Continuous     02/16/15 1825    Code Status History    Date Active Date Inactive Code Status Order ID Comments User Context   11/02/2014  4:34 AM 11/08/2014  6:12 PM Full Code [id number]  Ivor Costa, MD ED   10/11/2014  1:20 PM 10/17/2014  5:19 PM Full Code [id number]  Lodema Hong [name], PA-C Inpatient   10/09/2014  9:23 PM 10/11/2014  1:20 PM Full Code [id number]  Ivin Poot, MD ED   09/18/2014  1:22 PM 09/20/2014  7:56 PM Full Code [id number]  Jacqulynn Cadet, MD Inpatient   09/14/2014  5:27 PM 09/18/2014  1:22 PM Full Code [id number]  Kelvin Cellar, MD Inpatient   04/01/2014 10:56 AM 04/01/2014  9:29 PM Full Code [id number]  Jacqulynn Cadet, MD Inpatient   03/30/2014  8:52 PM 04/01/2014 10:56 AM Full Code [id number]  Deneise Lever, MD ED   08/04/2013  9:37 AM 08/06/2013  7:19 PM Full Code [id number]  Nita Sells, MD Inpatient   07/02/2013  2:19 PM 07/03/2013  7:04 PM Full Code [id number]  Ulyses Amor, PA-C Inpatient   06/13/2013  7:57 PM 06/21/2013  8:52 PM Full Code [id number]  Otho Bellows, MD Inpatient   06/05/2013  6:39 PM 06/12/2013  5:29 PM Full Code [id number]  Clinton Gallant, MD Inpatient    Advance Directive Documentation  Most Recent Value   Type of Advance Directive  Living will, Healthcare Power of Attorney   Pre-existing out of facility DNR order (yellow form or pink MOST form)     "MOST" Form in Place?       Family Communication:  Disposition Plan: - uncertain when will be discharged DVT prophylaxis: SCDs due to severe thrombocytopenia and elevated INR Consultants: oncology, gen surgery Procedures: Bone marrow biopsy, splenectomy    Objective: Filed Weights   03/06/2015 1845 02/19/2015 1650 02/23/15 0400  Weight: 74 kg (163 lb 2.3 oz) 79.1 kg (174 lb 6.1 oz) 79.9 kg (176 lb 2.4 oz)     Intake/Output Summary (Last 24 hours) at 02/23/15 1457 Last data filed at 02/23/15 1345  Gross per 24 hour  Intake 4330.5 ml  Output   1140 ml  Net 3190.5 ml     Vitals Filed Vitals:   02/23/15 1100 02/23/15 1115 02/23/15 1200 02/23/15 1345  BP:  147/63  130/60  Pulse:  91    Temp:  98.9 F (37.2 C)  98.8 F (37.1 C)  TempSrc:  Oral  Oral  Resp:  8 12   Height:      Weight:      SpO2: 95% 95% 96%     Exam:  General:  Pt is alert, not in acute distress  HEENT: No icterus, No thrush, oral mucosa moist  Cardiovascular: regular rate and rhythm, S1/S2 No murmur- 2/6 aortic murmurs  Respiratory: clear to auscultation bilaterally   Abdomen: Soft, +Bowel sounds, mild oozing of blood around drain, bloody drainage in Darrell drain-  non distended, no guarding  MSK: No cyanosis or clubbing- no pedal edema   Data Reviewed: Basic Metabolic Panel:  Recent Labs Lab 02/20/15 0500 02/21/15 0528 03/03/2015 0545 02/21/2015 1544 02/23/15 0307  NA 142 143 142 140 142  K 4.1 4.0 3.9 3.7 4.0  CL 106 109 107 108 108  CO2 '26 22 25 23 24  '$ GLUCOSE 128* 126* 113* 155* 93  BUN 27* 27* 27* 18 16  CREATININE 1.23 1.52* 1.18 1.12 1.05  CALCIUM 8.5* 8.4* 7.9* 7.4* 7.7*   Liver Function Tests:  Recent Labs Lab 02/17/15 0635  AST 10*  ALT 9*  ALKPHOS 68  BILITOT 1.3*  PROT 7.1  ALBUMIN 3.8   No results for input(s): LIPASE, AMYLASE in the last 168 hours. No results for input(s): AMMONIA in the last 168 hours. CBC:  Recent Labs Lab 02/19/15 0540 02/20/15 0500 02/21/15 0528 02/21/2015 0545 03/13/2015 1544 02/23/15 0307  WBC 3.5* 3.6* 4.5 4.3 6.5 18.7*  NEUTROABS 0.9* 1.0* 1.4* 1.9  --  7.3  HGB 7.2* 7.9* 7.3* 7.7* 10.5* 11.7*  HCT 22.4* 23.9* 22.9* 23.1* 30.4* 35.4*  MCV 85.2 83.0 85.4 83.4 85.4 86.3  PLT 27* 19* 17* 15* 38* 29*   Cardiac Enzymes: No results for input(s): CKTOTAL, CKMB, CKMBINDEX, TROPONINI in the last 168 hours. BNP (last 3 results) No results for  input(s): BNP in the last 8760 hours.  ProBNP (last 3 results) No results for input(s): PROBNP in the last 8760 hours.  CBG:  Recent Labs Lab 03/05/2015 2116 02/17/15 0738 02/17/15 1207 02/17/15 1708  GLUCAP 98 102* 109* 109*    Recent Results (from the past 240 hour(s))  Surgical pcr screen     Status: None   Collection Time: 02/21/15  8:58 PM  Result Value Ref Range Status   MRSA, PCR NEGATIVE NEGATIVE Final   Staphylococcus aureus NEGATIVE NEGATIVE Final  Comment:        The Xpert SA Assay (FDA approved for NASAL specimens in patients over 68 years of age), is one component of a comprehensive surveillance program.  Test performance has been validated by [facility] for patients greater than or equal to 58 year old. It is not intended to diagnose infection nor to guide or monitor treatment.      Studies: Dg Abd Portable 2v  02/20/2015  CLINICAL DATA:  Abdominal discomfort and distention EXAM: PORTABLE ABDOMEN - 2 VIEW COMPARISON:  CT abdomen and pelvis February 16, 2015 FINDINGS: Supine and left lateral decubitus images were obtained. There is no appreciable bowel dilatation. There are several air-fluid levels on the decubitus image. No free air is evident. There is a filter in the inferior vena cava with the apex of the filter at L1-2. There are surgical clips in the right upper quadrant. There is a stent in the aorta and both common iliac artery regions. IMPRESSION: No bowel dilatation. Scattered air-fluid levels are present. Suspect early ileus or a degree of enteritis. Obstruction is not appear likely. No free air. Inferior vena cava filter present. Electronically Signed   By: Lowella Grip III M.D.   On: 02/21/2015 08:47    Scheduled Meds:  Scheduled Meds: . acetaminophen  1,000 mg Intravenous Q6H  . antiseptic oral rinse  7 mL Mouth Rinse BID  . belladonna-PHENObarbital  5 mL Oral TID  . beta carotene w/minerals  1 tablet Oral Daily  . calcium chloride  1 g  Intravenous Once  . [START ON 02/25/2015] fentaNYL  50 mcg Transdermal Q72H  . fluconazole (DIFLUCAN) IV  200 mg Intravenous Q24H  . fluticasone  2 spray Each Nare Daily  . HYDROmorphone   Intravenous 6 times per day  . lip balm  1 application Topical BID  . loratadine  10 mg Oral Daily  . magic mouthwash  15 mL Oral QID  . nystatin  5 mL Oral QID  . pantoprazole  80 mg Oral BID  . simvastatin  20 mg Oral QHS   Continuous Infusions: . sodium chloride 100 mL/hr at 02/23/15 1200    Time spent on care of this patient: 35 min   [place], MD 02/23/2015, 2:57 PM  LOS: 7 days   [facility]  [phone] Pager - Text Page per [email or web address] If 7PM-7AM, please contact night-coverage [email or web address]

## 2015-02-24 ENCOUNTER — Encounter: Payer: Self-pay | Admitting: Hematology & Oncology

## 2015-02-24 ENCOUNTER — Encounter (HOSPITAL_COMMUNITY): Payer: Self-pay | Admitting: Surgery

## 2015-02-24 DIAGNOSIS — I10 Essential (primary) hypertension: Secondary | ICD-10-CM

## 2015-02-24 DIAGNOSIS — R140 Abdominal distension (gaseous): Secondary | ICD-10-CM

## 2015-02-24 LAB — PREPARE PLATELET PHERESIS: Unit division: 0

## 2015-02-24 LAB — CBC WITH DIFFERENTIAL/PLATELET
BASOS ABS: 0 10*3/uL (ref 0.0–0.1)
Band Neutrophils: 0 %
Basophils Relative: 0 %
Blasts: 5 %
EOS PCT: 0 %
Eosinophils Absolute: 0 10*3/uL (ref 0.0–0.7)
HEMATOCRIT: 29.9 % — AB (ref 39.0–52.0)
HEMOGLOBIN: 10 g/dL — AB (ref 13.0–17.0)
LYMPHS ABS: 1.4 10*3/uL (ref 0.7–4.0)
LYMPHS PCT: 6 %
MCH: 29.2 pg (ref 26.0–34.0)
MCHC: 33.4 g/dL (ref 30.0–36.0)
MCV: 87.2 fL (ref 78.0–100.0)
METAMYELOCYTES PCT: 0 %
MONOS PCT: 42 %
Monocytes Absolute: 10.1 10*3/uL — ABNORMAL HIGH (ref 0.1–1.0)
Myelocytes: 0 %
NEUTROS ABS: 11.3 10*3/uL — AB (ref 1.7–7.7)
Neutrophils Relative %: 47 %
OTHER: 0 %
Platelets: 44 10*3/uL — ABNORMAL LOW (ref 150–400)
Promyelocytes Absolute: 0 %
RBC: 3.43 MIL/uL — AB (ref 4.22–5.81)
RDW: 16.4 % — AB (ref 11.5–15.5)
WBC: 24.1 10*3/uL — AB (ref 4.0–10.5)
nRBC: 3 /100 WBC — ABNORMAL HIGH

## 2015-02-24 LAB — BASIC METABOLIC PANEL
Anion gap: 10 (ref 5–15)
BUN: 9 mg/dL (ref 6–20)
CALCIUM: 7.4 mg/dL — AB (ref 8.9–10.3)
CHLORIDE: 108 mmol/L (ref 101–111)
CO2: 23 mmol/L (ref 22–32)
CREATININE: 1.01 mg/dL (ref 0.61–1.24)
GFR calc non Af Amer: >60 mL/min (ref >60)
Glucose, Bld: 84 mg/dL (ref 65–99)
Potassium: 3.5 mmol/L (ref 3.5–5.1)
Sodium: 141 mmol/L (ref 135–145)

## 2015-02-24 MED ORDER — SODIUM CHLORIDE 0.9% FLUSH
10.0000 mL | INTRAVENOUS | Status: DC | PRN
  Administered 2015-02-26: 10 mL
  Filled 2015-02-24: qty 40

## 2015-02-24 MED ORDER — SODIUM CHLORIDE 0.9% FLUSH
10.0000 mL | Freq: Two times a day (BID) | INTRAVENOUS | Status: DC
  Administered 2015-02-28 – 2015-03-01 (×3): 10 mL

## 2015-02-24 MED ORDER — FENTANYL 25 MCG/HR TD PT72: 25.0000 ug | MEDICATED_PATCH | TRANSDERMAL | Status: DC

## 2015-02-24 NOTE — Progress Notes (Signed)
[facility] Progress Note   [name]  ZWC:[id number]  DOB: 1945-08-29  DOA: 03/05/2015 PCP: Shirline Frees, MD  Brief narrative: [name] is a 70 y.o. male with myelodysplastic syndrome and resultant pancytopenia who presents to the hospital for left flank and left upper abdominal pain. He is found to have significant splenomegaly and is noted to have transition of myelodysplastic syndrome to AML. It was recommended that I oncology he have a splenectomy. He has been receiving multiple transfusions for his pancytopenia.   Subjective: Complains of abdominal pain at incision site- no nausea or vomiting.   Assessment/Plan: Principal Problem:   AML (acute myeloid leukemia) with splenomegaly, pancytopenia and acquired coagulopathy with elevated INR - MDS has converted to AML per CT guided bone marrow biopsy that was performed on 02/17/15 - Oncology plans on palliative chemotherapy - s/p splenectomy on 2/8 - Continue supportive transfusions- has received numerous red blood cell, FFP and platelet transfusions - Received vitamin K for elevated INR on 2/72 - received pneumococcal/meningococcal/Haemophilus vaccines    Active Problems:  Thrush -Have added nystatin-continue Diflucan for today  Coronary atherosclerosis of native coronary artery  Hyperlipidemia -On simvastatin  History of DVT -Currently has an IVC filter as we are unable to anticoagulate in setting of thrombocytopenia and coagulopathy   Antibiotics: Anti-infectives    Start     Dose/Rate Route Frequency Ordered Stop   02/26/2015 2200  fluconazole (DIFLUCAN) IVPB 200 mg    Comments:  May convert to PO if patient taking SOLID food well   200 mg 100 mL/hr over 60 Minutes Intravenous Every 24 hours 03/04/2015 2154 02/27/15 2159   02/19/15 1000  fluconazole (DIFLUCAN) tablet 200 mg  Status:  Discontinued     200 mg Oral Daily 02/19/15 0850 02/21/2015 2154     Code Status:     Code Status Orders         Start     Ordered   02/27/2015 1826  Full code   Continuous     02/25/2015 1825    Code Status History    Date Active Date Inactive Code Status Order ID Comments User Context   11/02/2014  4:34 AM 11/08/2014  6:12 PM Full Code [id number]  Ivor Costa, MD ED   10/11/2014  1:20 PM 10/17/2014  5:19 PM Full Code [id number]  Lodema Hong [name], PA-C Inpatient   10/09/2014  9:23 PM 10/11/2014  1:20 PM Full Code [id number]  Ivin Poot, MD ED   09/18/2014  1:22 PM 09/20/2014  7:56 PM Full Code [id number]  Jacqulynn Cadet, MD Inpatient   09/14/2014  5:27 PM 09/18/2014  1:22 PM Full Code [id number]  Kelvin Cellar, MD Inpatient   04/01/2014 10:56 AM 04/01/2014  9:29 PM Full Code [id number]  Jacqulynn Cadet, MD Inpatient   03/30/2014  8:52 PM 04/01/2014 10:56 AM Full Code [id number]  Deneise Lever, MD ED   08/04/2013  9:37 AM 08/06/2013  7:19 PM Full Code [id number]  Nita Sells, MD Inpatient   07/02/2013  2:19 PM 07/03/2013  7:04 PM Full Code [id number]  Ulyses Amor, PA-C Inpatient   06/13/2013  7:57 PM 06/21/2013  8:52 PM Full Code [id number]  Otho Bellows, MD Inpatient   06/05/2013  6:39 PM 06/12/2013  5:29 PM Full Code [id number]  Clinton Gallant, MD Inpatient    Advance Directive Documentation        Most Recent Value   Type of Advance Directive  Living will, Healthcare Power of [place]  Pre-existing out of facility DNR order (yellow form or pink MOST form)     "MOST" Form in Place?       Family Communication:  Disposition Plan: - uncertain when will be discharged DVT prophylaxis: SCDs due to severe thrombocytopenia and elevated INR Consultants: oncology, gen surgery Procedures: Bone marrow biopsy, splenectomy    Objective: Filed Weights   02/24/2015 1845 02/18/2015 1650 02/23/15 0400  Weight: 74 kg (163 lb 2.3 oz) 79.1 kg (174 lb 6.1 oz) 79.9 kg (176 lb 2.4 oz)    Intake/Output Summary (Last 24 hours) at 02/24/15 1448 Last data filed at 02/24/15 1200  Gross per 24 hour  Intake   1900 ml  Output   1315 ml   Net    585 ml     Vitals Filed Vitals:   02/24/15 0709 02/24/15 0817 02/24/15 1201 02/24/15 1410  BP: 126/70   124/67  Pulse: 94   90  Temp: 98 F (36.7 C)   97.7 F (36.5 C)  TempSrc: Oral   Oral  Resp:  _0 Height:      Weight:      SpO2: 96% 96% 100% 100%    Exam:  General:  Pt is alert, not in acute distress  HEENT: No icterus, No thrush, oral mucosa moist  Cardiovascular: regular rate and rhythm, S1/S2 No murmur- 2/6 aortic murmurs  Respiratory: clear to auscultation bilaterally   Abdomen: Soft, +Bowel sounds, mild oozing of blood around drain, bloody drainage in JP drain-  non distended, no guarding  MSK: No cyanosis or clubbing- no pedal edema   Data Reviewed: Basic Metabolic Panel:  Recent Labs Lab 02/21/15 0528 03/07/2015 0545 03/02/2015 1544 02/23/15 0307 02/24/15 0410  NA 143 142 140 142 141  K 4.0 3.9 3.7 4.0 3.5  CL 109 107 108 108 108  CO2 _1 GLUCOSE 126* 113* 155* 93 84  BUN 27* 27* _2 CREATININE 1.52* 1.18 1.12 1.05 1.01  CALCIUM 8.4* 7.9* 7.4* 7.7* 7.4*   Liver Function Tests: No results for input(s): AST, ALT, ALKPHOS, BILITOT, PROT, ALBUMIN in the last 168 hours. No results for input(s): LIPASE, AMYLASE in the last 168 hours. No results for input(s): AMMONIA in the last 168 hours. CBC:  Recent Labs Lab 02/20/15 0500 02/21/15 0528 02/25/2015 0545 03/08/2015 1544 02/23/15 0307 02/24/15 0410  WBC 3.6* 4.5 4.3 6.5 18.7* 24.1*  NEUTROABS 1.0* 1.4* 1.9  --  7.3 11.3*  HGB 7.9* 7.3* 7.7* 10.5* 11.7* 10.0*  HCT 23.9* 22.9* 23.1* 30.4* 35.4* 29.9*  MCV 83.0 85.4 83.4 85.4 86.3 87.2  PLT 19* 17* 15* 38* 29* 44*   Cardiac Enzymes: No results for input(s): CKTOTAL, CKMB, CKMBINDEX, TROPONINI in the last 168 hours. BNP (last 3 results) No results for input(s): BNP in the last 8760 hours.  ProBNP (last 3 results) No results for input(s): PROBNP in the last 8760 hours.  CBG:  Recent Labs Lab 02/17/15 1708   GLUCAP 109*    Recent Results (from the past 240 hour(s))  Surgical pcr screen     Status: None   Collection Time: 02/21/15  8:58 PM  Result Value Ref Range Status   MRSA, PCR NEGATIVE NEGATIVE Final   Staphylococcus aureus NEGATIVE NEGATIVE Final    Comment:        The Xpert SA Assay (FDA approved for NASAL specimens in patients over 68 years of age), is one component of a comprehensive surveillance  program.  Test performance has been validated by [facility] for patients greater than or equal to 59 year old. It is not intended to diagnose infection nor to guide or monitor treatment.      Studies: No results found.  Scheduled Meds:  Scheduled Meds: . antiseptic oral rinse  7 mL Mouth Rinse BID  . belladonna-PHENObarbital  5 mL Oral TID  . beta carotene w/minerals  1 tablet Oral Daily  . calcium chloride  1 g Intravenous Once  . [START ON 02/25/2015] fentaNYL  50 mcg Transdermal Q72H  . fluconazole (DIFLUCAN) IV  200 mg Intravenous Q24H  . fluticasone  2 spray Each Nare Daily  . HYDROmorphone   Intravenous 6 times per day  . lip balm  1 application Topical BID  . loratadine  10 mg Oral Daily  . magic mouthwash  15 mL Oral QID  . nystatin  5 mL Oral QID  . pantoprazole  80 mg Oral BID  . simvastatin  20 mg Oral QHS  . sodium chloride flush  10-40 mL Intracatheter Q12H   Continuous Infusions: . sodium chloride 100 mL/hr at 02/24/15 0816    Time spent on care of this patient: 35 min   [place], MD 02/24/2015, 2:48 PM  LOS: 8 days   [facility] Office  [phone] Pager - Text Page per [email or web address] If 7PM-7AM, please contact night-coverage [email or web address]

## 2015-02-24 NOTE — Progress Notes (Signed)
Patient ID: [name], male   DOB: 04-04-1945, 70 y.o.   MRN: [id number]     [address] [place], [place] [address]    Phone: [phone] FAX: [phone]     Subjective: Burping.  No flatus.  WBC increased, erythema around incisional sites. H&h are stable.  Platelet count improved to 44k.    Objective:  Vital signs:  Filed Vitals:   02/24/15 0500 02/24/15 0600 02/24/15 0709 02/24/15 0817  BP: 132/59 116/56 126/70   Pulse: 92 88 94   Temp:   98 F (36.7 C)   TempSrc:   Oral   Resp: '12 11  12  '$ Height:      Weight:      SpO2: 98% 96% 96% 96%    Last BM Date: 02/23/15 (per patient. )  Intake/Output   Yesterday:  02/09 0701 - 02/10 0700 In: 2925 [I.V.:2425; Blood:400; IV Piggyback:100] Out: 4888 [Urine:975; Drains:170] This shift:  Total I/O In: -  Out: 600 [Urine:600]   Physical Exam: General: Pt awake/alert/oriented x4 in no acute distress  Abdomen: +BS, abdomen is distended, tender.  Lap sites with steri strips, surrounding erythema.  ruq incision dressing removed, staples in place, drain with serosanguinous output.     Problem List:   Principal Problem:   AML (acute myeloid leukemia) ([place]) Active Problems:   Essential hypertension   Coronary atherosclerosis of native coronary artery   Pancytopenia (HCC)   MDS (myelodysplastic syndrome), high grade (HCC)   Abdominal pain   Splenomegaly   Absolute anemia   Preop cardiovascular exam   Pressure ulcer   Status post laparoscopic splenectomy    Results:   Labs: Results for orders placed or performed during the hospital encounter of 02/21/2015 (from the past 48 hour(s))  CBC     Status: Abnormal   Collection Time: 03/11/2015  3:44 PM  Result Value Ref Range   WBC 6.5 4.0 - 10.5 K/uL   RBC 3.56 (L) 4.22 - 5.81 MIL/uL   Hemoglobin 10.5 (L) 13.0 - 17.0 g/dL    Comment: DELTA CHECK NOTED REPEATED TO VERIFY POST TRANSFUSION  SPECIMEN    HCT 30.4 (L) 39.0 - 52.0 %   MCV 85.4 78.0 - 100.0 fL   MCH 29.5 26.0 - 34.0 pg   MCHC 34.5 30.0 - 36.0 g/dL   RDW 15.0 11.5 - 15.5 %   Platelets 38 (L) 150 - 400 K/uL    Comment: REPEATED TO VERIFY SPECIMEN CHECKED FOR CLOTS CONSISTENT WITH PREVIOUS RESULT POST TRANSFUSION SPECIMEN   Basic metabolic panel     Status: Abnormal   Collection Time: 02/18/2015  3:44 PM  Result Value Ref Range   Sodium 140 135 - 145 mmol/L   Potassium 3.7 3.5 - 5.1 mmol/L   Chloride 108 101 - 111 mmol/L   CO2 23 22 - 32 mmol/L   Glucose, Bld 155 (H) 65 - 99 mg/dL   BUN 18 6 - 20 mg/dL   Creatinine, Ser 1.12 0.61 - 1.24 mg/dL   Calcium 7.4 (L) 8.9 - 10.3 mg/dL   GFR calc non Af Amer >60 >60 mL/min   GFR calc Af Amer >60 >60 mL/min    Comment: (NOTE) The eGFR has been calculated using the CKD EPI equation. This calculation has not been validated in all clinical situations. eGFR's persistently <60 mL/min signify possible Chronic Kidney Disease.    Anion gap 9 5 -  15  Basic metabolic panel     Status: Abnormal   Collection Time: 02/23/15  3:07 AM  Result Value Ref Range   Sodium 142 135 - 145 mmol/L   Potassium 4.0 3.5 - 5.1 mmol/L   Chloride 108 101 - 111 mmol/L   CO2 24 22 - 32 mmol/L   Glucose, Bld 93 65 - 99 mg/dL   BUN 16 6 - 20 mg/dL   Creatinine, Ser 1.58 0.61 - 1.24 mg/dL   Calcium 7.7 (L) 8.9 - 10.3 mg/dL   GFR calc non Af Amer >60 >60 mL/min   GFR calc Af Amer >60 >60 mL/min    Comment: (NOTE) The eGFR has been calculated using the CKD EPI equation. This calculation has not been validated in all clinical situations. eGFR's persistently <60 mL/min signify possible Chronic Kidney Disease.    Anion gap 10 5 - 15  CBC with Differential/Platelet     Status: Abnormal   Collection Time: 02/23/15  3:07 AM  Result Value Ref Range   WBC 18.7 (H) 4.0 - 10.5 K/uL    Comment: WHITE COUNT CONFIRMED ON SMEAR   RBC 4.10 (L) 4.22 - 5.81 MIL/uL   Hemoglobin 11.7 (L) 13.0 - 17.0  g/dL   HCT 06.3 (L) 86.8 - 54.8 %   MCV 86.3 78.0 - 100.0 fL   MCH 28.5 26.0 - 34.0 pg   MCHC 33.1 30.0 - 36.0 g/dL   RDW 83.0 (H) 14.1 - 59.7 %   Platelets 29 (LL) 150 - 400 K/uL    Comment: SPECIMEN CHECKED FOR CLOTS REPEATED TO VERIFY PLATELET COUNT CONFIRMED BY SMEAR CRITICAL RESULT CALLED TO, READ BACK BY AND VERIFIED WITH: JOHNSON RN AT 0455 ON 02.09.17 BY SHUEA    Neutrophils Relative % 39 %   Lymphocytes Relative 7 %   Monocytes Relative 46 %   Eosinophils Relative 0 %   Basophils Relative 0 %   Band Neutrophils 0 %   Metamyelocytes Relative 0 %   Myelocytes 0 %   Promyelocytes Absolute 0 %   Blasts 8 %   nRBC 0 0 /100 WBC   Other 0 %   Neutro Abs 7.3 1.7 - 7.7 K/uL   Lymphs Abs 1.3 0.7 - 4.0 K/uL   Monocytes Absolute 8.6 (H) 0.1 - 1.0 K/uL   Eosinophils Absolute 0.0 0.0 - 0.7 K/uL   Basophils Absolute 0.0 0.0 - 0.1 K/uL   RBC Morphology TEARDROP CELLS    WBC Morphology BLASTS   Prepare Pheresed Platelets     Status: None   Collection Time: 02/23/15  9:00 AM  Result Value Ref Range   Unit Number [id number]    Blood Component Type PLTP LI1 PAS    Unit division 00    Status of Unit ISSUED,FINAL    Transfusion Status OK TO TRANSFUSE   CBC with Differential/Platelet     Status: Abnormal   Collection Time: 02/24/15  4:10 AM  Result Value Ref Range   WBC 24.1 (H) 4.0 - 10.5 K/uL    Comment: WHITE COUNT CONFIRMED ON SMEAR RARE NRBCs    RBC 3.43 (L) 4.22 - 5.81 MIL/uL   Hemoglobin 10.0 (L) 13.0 - 17.0 g/dL   HCT 29.0 (L) 47.5 - 33.9 %   MCV 87.2 78.0 - 100.0 fL   MCH 29.2 26.0 - 34.0 pg   MCHC 33.4 30.0 - 36.0 g/dL   RDW 17.9 (H) 21.7 - 83.7 %   Platelets 44 (L) 150 - 400  K/uL    Comment: SPECIMEN CHECKED FOR CLOTS REPEATED TO VERIFY PLATELET COUNT CONFIRMED BY SMEAR    Neutrophils Relative % 47 %   Lymphocytes Relative 6 %   Monocytes Relative 42 %   Eosinophils Relative 0 %   Basophils Relative 0 %   Band Neutrophils 0 %   Metamyelocytes Relative  0 %   Myelocytes 0 %   Promyelocytes Absolute 0 %   Blasts 5 %   nRBC 3 (H) 0 /100 WBC   Other 0 %   Neutro Abs 11.3 (H) 1.7 - 7.7 K/uL   Lymphs Abs 1.4 0.7 - 4.0 K/uL   Monocytes Absolute 10.1 (H) 0.1 - 1.0 K/uL   Eosinophils Absolute 0.0 0.0 - 0.7 K/uL   Basophils Absolute 0.0 0.0 - 0.1 K/uL   WBC Morphology BLASTS    Smear Review LARGE PLATELETS PRESENT   Basic metabolic panel     Status: Abnormal   Collection Time: 02/24/15  4:10 AM  Result Value Ref Range   Sodium 141 135 - 145 mmol/L   Potassium 3.5 3.5 - 5.1 mmol/L   Chloride 108 101 - 111 mmol/L   CO2 23 22 - 32 mmol/L   Glucose, Bld 84 65 - 99 mg/dL   BUN 9 6 - 20 mg/dL   Creatinine, Ser 1.01 0.61 - 1.24 mg/dL   Calcium 7.4 (L) 8.9 - 10.3 mg/dL   GFR calc non Af Amer >60 >60 mL/min   GFR calc Af Amer >60 >60 mL/min    Comment: (NOTE) The eGFR has been calculated using the CKD EPI equation. This calculation has not been validated in all clinical situations. eGFR's persistently <60 mL/min signify possible Chronic Kidney Disease.    Anion gap 10 5 - 15    Imaging / Studies: No results found.  Medications / Allergies:  Scheduled Meds: . antiseptic oral rinse  7 mL Mouth Rinse BID  . belladonna-PHENObarbital  5 mL Oral TID  . beta carotene w/minerals  1 tablet Oral Daily  . calcium chloride  1 g Intravenous Once  . [START ON 02/25/2015] fentaNYL  50 mcg Transdermal Q72H  . fluconazole (DIFLUCAN) IV  200 mg Intravenous Q24H  . fluticasone  2 spray Each Nare Daily  . HYDROmorphone   Intravenous 6 times per day  . lip balm  1 application Topical BID  . loratadine  10 mg Oral Daily  . magic mouthwash  15 mL Oral QID  . nystatin  5 mL Oral QID  . pantoprazole  80 mg Oral BID  . simvastatin  20 mg Oral QHS  . sodium chloride flush  10-40 mL Intracatheter Q12H   Continuous Infusions: . sodium chloride 100 mL/hr at 02/24/15 0816   PRN Meds:.acetaminophen, alum & mag hydroxide-simeth, bisacodyl, diphenhydrAMINE,  HYDROmorphone (DILAUDID) injection, lactated ringers, lidocaine-prilocaine, menthol-cetylpyridinium, methocarbamol (ROBAXIN)  IV, metoprolol, naloxone **AND** sodium chloride flush, nitroGLYCERIN, ondansetron (ZOFRAN) IV, phenol, sodium chloride flush, temazepam  Antibiotics: Anti-infectives    Start     Dose/Rate Route Frequency Ordered Stop   02/17/2015 2200  fluconazole (DIFLUCAN) IVPB 200 mg    Comments:  May convert to PO if patient taking SOLID food well   200 mg 100 mL/hr over 60 Minutes Intravenous Every 24 hours 03/14/2015 2154 02/27/15 2159   02/19/15 1000  fluconazole (DIFLUCAN) tablet 200 mg  Status:  Discontinued     200 mg Oral Daily 02/19/15 0850 03/07/2015 2154        Assessment/Plan Splenomegaly POD#2  Laparoscopic assisted splenectomy - 03/08/2015 - D. Harryette Shuart  -ileus, needs to mobilize, keep NPO until bowel function returns  -change dressing as needed around drain, some oozing of blood. -monitor lap sites for developing infection, all have erythema so I question a reaction to steri-strips/prep rather than cellulitis. -continue drain  Heme-plt count improved, 44k.    AML on bone marrow biopsy, hgb stable - followed by Dr. Marin Olp VTE prophylaxis-SCD, hold on lovenox d/t platelet count FEN-IVF, PCA, can transition to IVP/PO as bowel function returns  Dispo-continue inpatient   Erby Pian, ANP-BC [facility]   02/24/2015 11:23 AM  Agree with above.  Alphonsa Overall, MD, [facility] Surgery Pager: [phone] Office phone:  [phone]

## 2015-02-24 NOTE — Progress Notes (Signed)
Pt's wife called but did not answer the phone. Voicemail message left on the wife's phone to notify of transfer to rm [id number].

## 2015-02-24 NOTE — Progress Notes (Signed)
[name] is slowly recovering. He is had no obvious bleeding. Has had no fever. He is still down the ICU.  His labs are as I expected. His white cell count is 24,000. Hemoglobin 10 and platelet count 44,000. His electrolytes look pretty good.  He is not yet eating much. I think he is up in chair.  He's had no cough. His been no nausea or vomiting. His abdomen is still somewhat distended.  His labs look okay. His temperature is 90.8. Pulse 94. Blood pressure 126/70. Head and neck exam shows no ocular or oral lesions. He has no palpable cervical or supraclavicular lymph nodes. His lungs are clear. Cardiac exam regular in rhythm with a 1/6 systolic ejection murmur. Abdomen is slightly distended. Bowel sounds are minimal. He has no guarding or rebound tenderness. Extremities shows no clubbing, cyanosis or edema.  [name] is still recovering from his surgery. Hopefully, he will it would ambulate a little bit more now. This I think will help with the abdominal distention and gas. Hopefully this will get his appetite stimulated.  His elevated white cell count is to be expected given his splenectomy. I'm glad that his plantar count is trending upward.  I still think that he is looking at another 5-7 days in the hospital.  Appreciate all the great care that he is getting down in the ICU.  [name] E.

## 2015-02-24 NOTE — Care Management Important Message (Signed)
Important Message  Patient Details  Name: [name] MRN: [id number] Date of Birth: 1945-11-12   Medicare Important Message Given:  Yes    Camillo Flaming 02/24/2015, 11:23 AMImportant Message  Patient Details  Name: [name] MRN: [id number] Date of Birth: 09-Jan-1946   Medicare Important Message Given:  Yes    Camillo Flaming 02/24/2015, 11:23 AM

## 2015-02-25 DIAGNOSIS — C9200 Acute myeloblastic leukemia, not having achieved remission: Principal | ICD-10-CM

## 2015-02-25 DIAGNOSIS — D689 Coagulation defect, unspecified: Secondary | ICD-10-CM | POA: Diagnosis present

## 2015-02-25 LAB — MAGNESIUM: Magnesium: 1.5 mg/dL — ABNORMAL LOW (ref 1.7–2.4)

## 2015-02-25 LAB — BASIC METABOLIC PANEL
Anion gap: 12 (ref 5–15)
BUN: 10 mg/dL (ref 6–20)
CHLORIDE: 110 mmol/L (ref 101–111)
CO2: 21 mmol/L — AB (ref 22–32)
CREATININE: 0.99 mg/dL (ref 0.61–1.24)
Calcium: 7.7 mg/dL — ABNORMAL LOW (ref 8.9–10.3)
GFR calc Af Amer: >60 mL/min (ref >60)
GFR calc non Af Amer: >60 mL/min (ref >60)
Glucose, Bld: 89 mg/dL (ref 65–99)
POTASSIUM: 3.2 mmol/L — AB (ref 3.5–5.1)
Sodium: 143 mmol/L (ref 135–145)

## 2015-02-25 LAB — TYPE AND SCREEN
ABO/RH(D): A POS
Antibody Screen: NEGATIVE
UNIT DIVISION: 0
UNIT DIVISION: 0
UNIT DIVISION: 0
UNIT DIVISION: 0
UNIT DIVISION: 0
Unit division: 0
Unit division: 0
Unit division: 0
Unit division: 0
Unit division: 0

## 2015-02-25 LAB — CBC WITH DIFFERENTIAL/PLATELET
BASOS PCT: 0 %
Basophils Absolute: 0 10*3/uL (ref 0.0–0.1)
EOS PCT: 0 %
Eosinophils Absolute: 0 10*3/uL (ref 0.0–0.7)
HEMATOCRIT: 28.2 % — AB (ref 39.0–52.0)
HEMOGLOBIN: 9.2 g/dL — AB (ref 13.0–17.0)
LYMPHS ABS: 2.4 10*3/uL (ref 0.7–4.0)
Lymphocytes Relative: 10 %
MCH: 28.8 pg (ref 26.0–34.0)
MCHC: 32.6 g/dL (ref 30.0–36.0)
MCV: 88.1 fL (ref 78.0–100.0)
MONOS PCT: 49 %
Monocytes Absolute: 12 10*3/uL — ABNORMAL HIGH (ref 0.1–1.0)
NEUTROS ABS: 10 10*3/uL — AB (ref 1.7–7.7)
Neutrophils Relative %: 41 %
Platelets: 17 10*3/uL — CL (ref 150–400)
RBC: 3.2 MIL/uL — ABNORMAL LOW (ref 4.22–5.81)
RDW: 16.4 % — ABNORMAL HIGH (ref 11.5–15.5)
WBC: 24.4 10*3/uL — ABNORMAL HIGH (ref 4.0–10.5)

## 2015-02-25 MED ORDER — VITAMIN K1 10 MG/ML IJ SOLN
10.0000 mg | Freq: Two times a day (BID) | INTRAMUSCULAR | Status: AC
  Administered 2015-02-25 – 2015-02-26 (×3): 10 mg via SUBCUTANEOUS
  Filled 2015-02-25 (×3): qty 1

## 2015-02-25 MED ORDER — POTASSIUM CHLORIDE 10 MEQ/100ML IV SOLN
10.0000 meq | INTRAVENOUS | Status: AC
  Administered 2015-02-25 (×2): 10 meq via INTRAVENOUS
  Filled 2015-02-25 (×3): qty 100

## 2015-02-25 MED ORDER — MAGNESIUM SULFATE 4 GM/100ML IV SOLN
4.0000 g | Freq: Once | INTRAVENOUS | Status: AC
  Administered 2015-02-25: 4 g via INTRAVENOUS
  Filled 2015-02-25: qty 100

## 2015-02-25 MED ORDER — HYDROMORPHONE HCL 1 MG/ML IJ SOLN
1.0000 mg | INTRAMUSCULAR | Status: DC | PRN
  Administered 2015-02-25 – 2015-02-26 (×4): 1 mg via INTRAVENOUS
  Filled 2015-02-25 (×4): qty 1

## 2015-02-25 MED ORDER — HYDROCODONE-ACETAMINOPHEN 5-325 MG PO TABS
1.0000 | ORAL_TABLET | ORAL | Status: DC | PRN
Start: 2015-02-25 — End: 2015-02-26
  Administered 2015-02-25 – 2015-02-26 (×3): 2 via ORAL
  Filled 2015-02-25 (×3): qty 2

## 2015-02-25 MED ORDER — POTASSIUM CHLORIDE 10 MEQ/100ML IV SOLN
INTRAVENOUS | Status: AC
Start: 2015-02-25 — End: 2015-02-26
  Filled 2015-02-25: qty 100

## 2015-02-25 NOTE — Progress Notes (Signed)
CRITICAL VALUE ALERT  Critical value received:  Platelets 17  Date of notification:  02/25/15  Time of notification:  0628  Critical value read back:Yes.    Nurse who received alert:  J.Lanesha Azzaro RN  MD notified (1st page):  Rogue Bussing  Time of first page:  [phone]  MD notified (2nd page):  Time of second page:  Responding MD:  Rogue Bussing  Time MD responded:  [phone], asked about active bleeding. RN reported that pt has JP drain. No new orders given, will inform day shift nurse and continue to monitor.

## 2015-02-25 NOTE — Progress Notes (Addendum)
[facility] Progress Note   [name]  [id number]  DOB: 07/09/1945  DOA: 02/28/2015 PCP: Shirline Frees, MD  Brief narrative: [name] is a 70 y.o. male with myelodysplastic syndrome and resultant pancytopenia who presents to the hospital for left flank and left upper abdominal pain. He is found to have significant splenomegaly and is noted to have transition of myelodysplastic syndrome to AML. It was recommended that I oncology he have a splenectomy. He has been receiving multiple transfusions for his pancytopenia.   Subjective: No significant pain, nausea, vomiting, cough or dyspnea. Complains of being hungry. Passing gas. No BM.   Assessment/Plan: Principal Problem:   AML (acute myeloid leukemia) with splenomegaly, pancytopenia and acquired coagulopathy with elevated INR - MDS has converted to AML per CT guided bone marrow biopsy that was performed on 02/17/15 - Oncology plans on palliative chemotherapy - s/p splenectomy on 2/8 - Continue supportive transfusions- has received numerous red blood cell, FFP and platelet transfusions - Received vitamin K for elevated INR on 2/72 - received pneumococcal/meningococcal/Haemophilus vaccines    Active Problems:  S/p splenectomy - surgery assisting with management - tapered from, 50 mcg of Fentanyl patch to 25 mcg- follow pain levels - will d/c Dilaudid PCA as he is barely using it- change IV Dilaudid to '1mg'$  Q4 PRN (used it only 2 x yesterday)  Hypokalemia - due to lack of PO intake- will replace via IV and recheck tomorrow  Thrush -Have added nystatin-continue Diflucan    Coronary atherosclerosis of native coronary artery  Hyperlipidemia -On simvastatin  History of DVT -Currently has an IVC filter as we are unable to anticoagulate in setting of thrombocytopenia and coagulopathy   Antibiotics: Anti-infectives    Start     Dose/Rate Route Frequency Ordered Stop   02/19/2015 2200  fluconazole (DIFLUCAN) IVPB  200 mg    Comments:  May convert to PO if patient taking SOLID food well   200 mg 100 mL/hr over 60 Minutes Intravenous Every 24 hours 02/20/2015 2154 02/27/15 2159   02/19/15 1000  fluconazole (DIFLUCAN) tablet 200 mg  Status:  Discontinued     200 mg Oral Daily 02/19/15 0850 03/12/2015 2154     Code Status:     Code Status Orders        Start     Ordered   03/07/2015 1826  Full code   Continuous     02/22/2015 1825    Code Status History    Date Active Date Inactive Code Status Order ID Comments User Context   11/02/2014  4:34 AM 11/08/2014  6:12 PM Full Code [id number]  Ivor Costa, MD ED   10/11/2014  1:20 PM 10/17/2014  5:19 PM Full Code [id number]  Freddrick March, PA-C Inpatient   10/09/2014  9:23 PM 10/11/2014  1:20 PM Full Code [id number]  Ivin Poot, MD ED   09/18/2014  1:22 PM 09/20/2014  7:56 PM Full Code [id number]  Jacqulynn Cadet, MD Inpatient   09/14/2014  5:27 PM 09/18/2014  1:22 PM Full Code [id number]  Kelvin Cellar, MD Inpatient   04/01/2014 10:56 AM 04/01/2014  9:29 PM Full Code [id number]  Jacqulynn Cadet, MD Inpatient   03/30/2014  8:52 PM 04/01/2014 10:56 AM Full Code [id number]  Deneise Lever, MD ED   08/04/2013  9:37 AM 08/06/2013  7:19 PM Full Code [id number]  Nita Sells, MD Inpatient   07/02/2013  2:19 PM 07/03/2013  7:04 PM Full Code [id number]  Ulyses Amor, PA-C  Inpatient   06/13/2013  7:57 PM 06/21/2013  8:52 PM Full Code [id number]  Otho Bellows, MD Inpatient   06/05/2013  6:39 PM 06/12/2013  5:29 PM Full Code [id number]  Clinton Gallant, MD Inpatient    Advance Directive Documentation        Most Recent Value   Type of Advance Directive  Living will, Healthcare Power of Attorney   Pre-existing out of facility DNR order (yellow form or pink MOST form)     "MOST" Form in Place?       Family Communication: wife  Disposition Plan: - uncertain when will be discharged DVT prophylaxis: SCDs due to severe thrombocytopenia and elevated INR Consultants: oncology, gen  surgery Procedures: Bone marrow biopsy, splenectomy    Objective: Filed Weights   02/25/2015 1845 03/13/2015 1650 02/23/15 0400  Weight: 74 kg (163 lb 2.3 oz) 79.1 kg (174 lb 6.1 oz) 79.9 kg (176 lb 2.4 oz)    Intake/Output Summary (Last 24 hours) at 02/25/15 1523 Last data filed at 02/25/15 1300  Gross per 24 hour  Intake   2700 ml  Output   1190 ml  Net   1510 ml     Vitals Filed Vitals:   02/25/15 0200 02/25/15 0405 02/25/15 0601 02/25/15 1440  BP: 102/66  104/57 134/61  Pulse: 73  64 81  Temp: 98.5 F (36.9 C)  97.8 F (36.6 C) 98.4 F (36.9 C)  TempSrc: Oral  Oral Oral  Resp: '12 11 13 20  '$ Height:      Weight:      SpO2: 95% 96% 96% 100%    Exam:  General:  Pt is alert, not in acute distress  HEENT: No icterus, No thrush, oral mucosa moist  Cardiovascular: regular rate and rhythm, S1/S2 No murmur- 2/6 aortic murmurs  Respiratory: clear to auscultation bilaterally   Abdomen: Soft, +Bowel sounds,  bloody drainage in JP drain-  non distended, no guarding  MSK: No cyanosis or clubbing- no pedal edema   Data Reviewed: Basic Metabolic Panel:  Recent Labs Lab 03/02/2015 0545 03/11/2015 1544 02/23/15 0307 02/24/15 0410 02/25/15 0410 02/25/15 0417  NA 142 140 142 141 143  --   K 3.9 3.7 4.0 3.5 3.2*  --   CL 107 108 108 108 110  --   CO2 '25 23 24 23 '$ 21*  --   GLUCOSE 113* 155* 93 84 89  --   BUN 27* '18 16 9 10  '$ --   CREATININE 1.18 1.12 1.05 1.01 0.99  --   CALCIUM 7.9* 7.4* 7.7* 7.4* 7.7*  --   MG  --   --   --   --   --  1.5*   Liver Function Tests: No results for input(s): AST, ALT, ALKPHOS, BILITOT, PROT, ALBUMIN in the last 168 hours. No results for input(s): LIPASE, AMYLASE in the last 168 hours. No results for input(s): AMMONIA in the last 168 hours. CBC:  Recent Labs Lab 02/21/15 0528 03/05/2015 0545 02/23/2015 1544 02/23/15 0307 02/24/15 0410 02/25/15 0410  WBC 4.5 4.3 6.5 18.7* 24.1* 24.4*  NEUTROABS 1.4* 1.9  --  7.3 11.3* 10.0*  HGB  7.3* 7.7* 10.5* 11.7* 10.0* 9.2*  HCT 22.9* 23.1* 30.4* 35.4* 29.9* 28.2*  MCV 85.4 83.4 85.4 86.3 87.2 88.1  PLT 17* 15* 38* 29* 44* 17*   Cardiac Enzymes: No results for input(s): CKTOTAL, CKMB, CKMBINDEX, TROPONINI in the last 168 hours. BNP (last 3 results) No results for input(s): BNP in the  last 8760 hours.  ProBNP (last 3 results) No results for input(s): PROBNP in the last 8760 hours.  CBG: No results for input(s): GLUCAP in the last 168 hours.  Recent Results (from the past 240 hour(s))  Surgical pcr screen     Status: None   Collection Time: 02/21/15  8:58 PM  Result Value Ref Range Status   MRSA, PCR NEGATIVE NEGATIVE Final   Staphylococcus aureus NEGATIVE NEGATIVE Final    Comment:        The Xpert SA Assay (FDA approved for NASAL specimens in patients over 67 years of age), is one component of a comprehensive surveillance program.  Test performance has been validated by [facility] for patients greater than or equal to 27 year old. It is not intended to diagnose infection nor to guide or monitor treatment.      Studies: No results found.  Scheduled Meds:  Scheduled Meds: . antiseptic oral rinse  7 mL Mouth Rinse BID  . belladonna-PHENObarbital  5 mL Oral TID  . beta carotene w/minerals  1 tablet Oral Daily  . calcium chloride  1 g Intravenous Once  . fentaNYL  25 mcg Transdermal Q72H  . fluconazole (DIFLUCAN) IV  200 mg Intravenous Q24H  . fluticasone  2 spray Each Nare Daily  . lip balm  1 application Topical BID  . loratadine  10 mg Oral Daily  . magic mouthwash  15 mL Oral QID  . magnesium sulfate 1 - 4 g bolus IVPB  4 g Intravenous Once  . nystatin  5 mL Oral QID  . pantoprazole  80 mg Oral BID  . phytonadione  10 mg Subcutaneous Q12H  . simvastatin  20 mg Oral QHS  . sodium chloride flush  10-40 mL Intracatheter Q12H   Continuous Infusions: . sodium chloride 100 mL/hr at 02/25/15 0540    Time spent on care of this patient: 35  min   [place], MD 02/25/2015, 3:23 PM  LOS: 9 days   [facility] Office  [phone] Pager - Text Page per [email or web address] If 7PM-7AM, please contact night-coverage [email or web address]

## 2015-02-25 NOTE — Progress Notes (Signed)
[name] Office:  [phone] General Surgery Progress Note   LOS: 9 days  POD -  3 Days Post-Op PCP - Dr. Tomasita Crumble   Assessment/Plan: 1.  Splenectomy - 02/18/2015 - [name]  Symptomatic splenomegaly  Has passed gas.  Will start clear liquids.  He thinks that Dilaudid is making him sick, but does not want to try other IV narcotic.  Will start Vicodin and see how he does.  2.  Myelodysplastic syndrome  Bone marrow biopsy - now acute myeloid leukemia  Followed by Dr. Marolyn Haller.  Thrombocytopenia  Plt count - 17,000 - 02/25/2015  Splenectomy has not helped plts as much as hoped 3b. Anemia  Hgb 9.2 - 02/25/2015  4.  CAD  Inferior MI in 2010.  Sees Dr. Burt Knack  Last Echo - 03/31/2014 - EF - 55%  Appreciate Dr. Jacalyn Lefevre input 5.  History of VATS 6.  History of endovascular stent for AAA    7.  History of IVC filter for DVT 8.  DVT prophylaxis - on hold for thrombocytopenia 9.  History of "dilaudid withdrawal" when he had his chest surgery. 50.  Thrush - on nystatin suspension   Principal Problem:   AML (acute myeloid leukemia) ([name]) Active Problems:   Essential hypertension   Coronary atherosclerosis of native coronary artery   Pancytopenia (HCC)   MDS (myelodysplastic syndrome), high grade (HCC)   COPD (chronic obstructive pulmonary disease) (HCC)   Acid reflux   Abdominal pain   Thrombocytopenia (HCC)   Splenomegaly   Absolute anemia   Pressure ulcer   Status post laparoscopic splenectomy   Subjective:  Wants some thing to drink.  Has passed flatus.  Wife at bedside.  Objective:   Filed Vitals:   02/25/15 0601 02/25/15 1440  BP: 104/57 134/61  Pulse: 64 81  Temp: 97.8 F (36.6 C) 98.4 F (36.9 C)  Resp: 13 20     Intake/Output from previous day:  02/10 0701 - 02/11 0700 In: 2700 [I.V.:2500; IV Piggyback:200] Out: 1560 [Urine:1525; Drains:35]  Intake/Output this shift:  Total I/O In: -  Out: 250 [Urine:250]   Physical  Exam:   General: Thin older WM who is alert and oriented.   He acts like he is nauseated and just spits.  He blames dilaudid.   HEENT: Normal. Pupils equal. .   Lungs: Clear, though inspiratory effort is not great.    IS about 1,000 cc   Cards:  3/6 systolic murmur   Abdomen: Incision okay.  Drain in LUQ - 35 cc recorded last 24 hours.  He has good bowel sounds.   Lab Results:     Recent Labs  02/24/15 0410 02/25/15 0410  WBC 24.1* 24.4*  HGB 10.0* 9.2*  HCT 29.9* 28.2*  PLT 44* 17*    BMET    Recent Labs  02/24/15 0410 02/25/15 0410  NA 141 143  K 3.5 3.2*  CL 108 110  CO2 23 21*  GLUCOSE 84 89  BUN 9 10  CREATININE 1.01 0.99  CALCIUM 7.4* 7.7*    PT/INR   No results for input(s): LABPROT, INR in the last 72 hours.  ABG  No results for input(s): PHART, HCO3 in the last 72 hours.  Invalid input(s): PCO2, PO2   Studies/Results:  No results found.   Anti-infectives:   Anti-infectives    Start     Dose/Rate Route Frequency Ordered Stop   02/18/2015 2200  fluconazole (DIFLUCAN) IVPB 200 mg    Comments:  May convert to PO if patient taking SOLID food well   200 mg 100 mL/hr over 60 Minutes Intravenous Every 24 hours 03/04/2015 2154 02/27/15 2159   02/19/15 1000  fluconazole (DIFLUCAN) tablet 200 mg  Status:  Discontinued     200 mg Oral Daily 02/19/15 0850 03/09/2015 2154      Alphonsa Overall, MD, FACS Pager: [name] Office: [phone] 02/25/2015

## 2015-02-25 NOTE — Progress Notes (Addendum)
IP PROGRESS NOTE  Subjective:   He reports soreness at the splenectomy site. No other complaint.  Objective: Vital signs in last 24 hours: Blood pressure 104/57, pulse 64, temperature 97.8 F (36.6 C), temperature source Oral, resp. rate 13, height 5\' 10"  (1.778 m), weight 176 lb 2.4 oz (79.9 kg), SpO2 96 %.  Intake/Output from previous day: 02/10 0701 - 02/11 0700 In: 2700 [I.V.:2500; IV Piggyback:200] Out: 1560 [Urine:1525; Drains:35]  Physical Exam:  HEENT: No thrush Abdomen: Mild tenderness in the left and right abdomen, bloody drainage at the left upper quadrant drain and over the skin surrounding the drain site  Portacath/PICC-without erythema  Lab Results:  Recent Labs  02/24/15 0410 02/25/15 0410  WBC 24.1* 24.4*  HGB 10.0* 9.2*  HCT 29.9* 28.2*  PLT 44* 17*    BMET  Recent Labs  02/24/15 0410 02/25/15 0410  NA 141 143  K 3.5 3.2*  CL 108 110  CO2 23 21*  GLUCOSE 84 89  BUN 9 10  CREATININE 1.01 0.99  CALCIUM 7.4* 7.7*    Studies/Results: No results found.  Medications: I have reviewed the patient's current medications.  Assessment/Plan:  1. AML 2. Status post splenectomy 02/24/2015 3. Anemia/thrombocytopenia secondary to AML 4. Coagulopathy-likely related to malnutrition and polypharmacy  He is recovering from the splenectomy procedure. The platelets remain low after a platelet transfusion.  Recommendations: 1. Transfuse 1 unit of apheresis platelets if he continues to have bleeding at the surgical site 2. Trial of vitamin K for the coagulopathy 3. Please call hematology as needed, Dr. Marin Olp will return 02/27/2015   LOS: 9 days   Betsy Coder, MD   02/25/2015, 10:29 AM

## 2015-02-26 ENCOUNTER — Inpatient Hospital Stay (HOSPITAL_COMMUNITY): Payer: Medicare Other

## 2015-02-26 LAB — APTT: APTT: 35 s (ref 24–37)

## 2015-02-26 LAB — CBC WITH DIFFERENTIAL/PLATELET
BAND NEUTROPHILS: 0 %
BASOS ABS: 0 10*3/uL (ref 0.0–0.1)
BASOS PCT: 0 %
Blasts: 3 %
EOS ABS: 0 10*3/uL (ref 0.0–0.7)
EOS PCT: 0 %
HCT: 27.5 % — ABNORMAL LOW (ref 39.0–52.0)
Hemoglobin: 9.4 g/dL — ABNORMAL LOW (ref 13.0–17.0)
LYMPHS ABS: 5 10*3/uL — AB (ref 0.7–4.0)
LYMPHS PCT: 18 %
MCH: 29.1 pg (ref 26.0–34.0)
MCHC: 34.2 g/dL (ref 30.0–36.0)
MCV: 85.1 fL (ref 78.0–100.0)
MONOS PCT: 31 %
Metamyelocytes Relative: 0 %
Monocytes Absolute: 8.5 10*3/uL — ABNORMAL HIGH (ref 0.1–1.0)
Myelocytes: 0 %
NEUTROS ABS: 13.2 10*3/uL — AB (ref 1.7–7.7)
NRBC: 0 /100{WBCs}
Neutrophils Relative %: 48 %
PLATELETS: 19 10*3/uL — AB (ref 150–400)
Promyelocytes Absolute: 0 %
RBC: 3.23 MIL/uL — ABNORMAL LOW (ref 4.22–5.81)
RDW: 16.2 % — AB (ref 11.5–15.5)
WBC: 27.5 10*3/uL — ABNORMAL HIGH (ref 4.0–10.5)

## 2015-02-26 LAB — PROTIME-INR
INR: 1.19 (ref 0.00–1.49)
Prothrombin Time: 15.2 seconds (ref 11.6–15.2)

## 2015-02-26 LAB — BASIC METABOLIC PANEL
Anion gap: 12 (ref 5–15)
BUN: 10 mg/dL (ref 6–20)
CO2: 19 mmol/L — AB (ref 22–32)
Calcium: 7.3 mg/dL — ABNORMAL LOW (ref 8.9–10.3)
Chloride: 107 mmol/L (ref 101–111)
Creatinine, Ser: 0.91 mg/dL (ref 0.61–1.24)
GFR calc Af Amer: >60 mL/min (ref >60)
GLUCOSE: 99 mg/dL (ref 65–99)
POTASSIUM: 3.1 mmol/L — AB (ref 3.5–5.1)
Sodium: 138 mmol/L (ref 135–145)

## 2015-02-26 LAB — MAGNESIUM: Magnesium: 1.8 mg/dL (ref 1.7–2.4)

## 2015-02-26 MED ORDER — FENTANYL 50 MCG/HR TD PT72
50.0000 ug | MEDICATED_PATCH | TRANSDERMAL | Status: DC
  Administered 2015-02-26 – 2015-03-01 (×2): 50 ug via TRANSDERMAL
  Filled 2015-02-26 (×2): qty 1

## 2015-02-26 MED ORDER — MAGIC MOUTHWASH
15.0000 mL | Freq: Four times a day (QID) | ORAL | Status: DC | PRN
  Filled 2015-02-26: qty 15

## 2015-02-26 MED ORDER — ACETAMINOPHEN 500 MG PO TABS
1000.0000 mg | ORAL_TABLET | Freq: Three times a day (TID) | ORAL | Status: DC
  Administered 2015-02-26 – 2015-02-28 (×2): 1000 mg via ORAL
  Filled 2015-02-26 (×11): qty 2

## 2015-02-26 MED ORDER — FENTANYL 50 MCG/HR TD PT72: 50.0000 ug | MEDICATED_PATCH | TRANSDERMAL | Status: DC

## 2015-02-26 MED ORDER — ONDANSETRON HCL 4 MG/2ML IJ SOLN
4.0000 mg | Freq: Four times a day (QID) | INTRAMUSCULAR | Status: DC | PRN
  Administered 2015-02-26 – 2015-02-27 (×2): 4 mg via INTRAVENOUS
  Filled 2015-02-26 (×2): qty 2

## 2015-02-26 MED ORDER — METHOCARBAMOL 500 MG PO TABS
1000.0000 mg | ORAL_TABLET | Freq: Four times a day (QID) | ORAL | Status: DC
  Filled 2015-02-26 (×6): qty 2

## 2015-02-26 MED ORDER — LIP MEDEX EX OINT: 1.0000 "application " | TOPICAL_OINTMENT | Freq: Two times a day (BID) | CUTANEOUS | Status: DC

## 2015-02-26 MED ORDER — MENTHOL 3 MG MT LOZG: 1.0000 | LOZENGE | OROMUCOSAL | Status: DC | PRN

## 2015-02-26 MED ORDER — HYDROMORPHONE HCL 1 MG/ML IJ SOLN: 0.5000 mg | INTRAMUSCULAR | Status: DC | PRN

## 2015-02-26 MED ORDER — FUROSEMIDE 10 MG/ML IJ SOLN
40.0000 mg | Freq: Once | INTRAMUSCULAR | Status: AC
  Administered 2015-02-26: 40 mg via INTRAVENOUS
  Filled 2015-02-26: qty 4

## 2015-02-26 MED ORDER — SODIUM CHLORIDE 0.9% FLUSH: 3.0000 mL | Freq: Two times a day (BID) | INTRAVENOUS | Status: DC

## 2015-02-26 MED ORDER — PHENOL 1.4 % MT LIQD: 2.0000 | OROMUCOSAL | Status: DC | PRN

## 2015-02-26 MED ORDER — TAB-A-VITE/IRON PO TABS
1.0000 | ORAL_TABLET | Freq: Every day | ORAL | Status: DC
  Filled 2015-02-26 (×2): qty 1

## 2015-02-26 MED ORDER — ONDANSETRON HCL 4 MG PO TABS
4.0000 mg | ORAL_TABLET | Freq: Three times a day (TID) | ORAL | Status: DC
  Administered 2015-02-26 (×2): 4 mg via ORAL
  Filled 2015-02-26 (×3): qty 1

## 2015-02-26 MED ORDER — DIPHENHYDRAMINE HCL 50 MG/ML IJ SOLN: 12.5000 mg | Freq: Four times a day (QID) | INTRAMUSCULAR | Status: DC | PRN

## 2015-02-26 MED ORDER — POLYETHYLENE GLYCOL 3350 17 G PO PACK
17.0000 g | PACK | Freq: Every day | ORAL | Status: DC
  Administered 2015-02-28: 17 g via ORAL
  Filled 2015-02-26 (×6): qty 1

## 2015-02-26 MED ORDER — SODIUM CHLORIDE 0.9% FLUSH: 3.0000 mL | INTRAVENOUS | Status: DC | PRN

## 2015-02-26 MED ORDER — OXYCODONE HCL 5 MG PO TABS
5.0000 mg | ORAL_TABLET | ORAL | Status: DC | PRN
  Administered 2015-02-26 (×2): 10 mg via ORAL
  Filled 2015-02-26 (×2): qty 2

## 2015-02-26 MED ORDER — SODIUM CHLORIDE 0.9 % IV SOLN: 250.0000 mL | INTRAVENOUS | Status: DC | PRN

## 2015-02-26 MED ORDER — LACTATED RINGERS IV BOLUS (SEPSIS): 1000.0000 mL | Freq: Three times a day (TID) | INTRAVENOUS | Status: DC | PRN

## 2015-02-26 NOTE — Progress Notes (Addendum)
[address] [place], [place] [address] Phone: [phone] FAX: [phone]   [name] [id number] 1945-09-12    Problem List:   Principal Problem:   AML (acute myeloid leukemia) [facility]) Active Problems:   Essential hypertension   Coronary atherosclerosis of native coronary artery   Pancytopenia (HCC)   MDS (myelodysplastic syndrome), high grade (HCC)   COPD (chronic obstructive pulmonary disease) (HCC)   Acid reflux   Abdominal pain   Thrombocytopenia (HCC)   Splenomegaly   Absolute anemia   Pressure ulcer   Status post laparoscopic splenectomy   Coagulopathy ([place])   4 Days Post-Op  02/15/2015 - 02/15/2015  Procedure(s): LAPAROSCOPIC ASSISTED SPLENECTOMY    Assessment/Plan: 1.  Splenectomy - 03/08/2015 - D. Newman  Symptomatic splenomegaly & hypersplenism Remove drain   2.  Myelodysplastic syndrome  Bone marrow biopsy - now acute myeloid leukemia  Followed by Dr. Marolyn Haller.  Thrombocytopenia  Plt count - 17,000 - 02/25/2015  Splenectomy has not helped plts as much as hoped yet - ?restart steroids  3b. Anemia  Hgb 9.2 - 02/25/2015  4.  CAD  Inferior MI in 2010.  Sees Dr. Burt Knack  Last Echo - 03/31/2014 - EF - 55%  Appreciate Dr. Jacalyn Lefevre input  5.  History of VATS  6.  History of endovascular stent for AAA    7.  History of IVC filter for DVT  8.  DVT prophylaxis - on hold for thrombocytopenia  9.  History of "dilaudid withdrawal" when he had his chest surgery. - IMPROVE PAIN CONTROL.  Try standing tyelenol/robaxin.  Inc fent patch  9a.  N/v.  No gastroparesis in 10/16 [facility] nuc med scan but suspicious.  Poss gastric ileus s/p splenectomy.  Try round the clock zofran.  Has issues w MANY meds, esp nausea meds.  9b.  GERD - PPI  10.  Thrush - on nystatin suspension   Adin Hector, M.D., F.A.C.S. Gastrointestinal and Minimally Invasive Surgery [facility] [address] [place] [address] [place],  [address] [phone] Main / Paging   02/26/2015  Subjective:  Intermittent nausea/vomiting Had BMs though Tired sore  Objective:  Vital signs:  Filed Vitals:   02/25/15 0601 02/25/15 1440 02/25/15 2153 02/26/15 0344  BP: 104/57 134/61 112/59 143/67  Pulse: 64 81 72 85  Temp: 97.8 F (36.6 C) 98.4 F (36.9 C) 97.7 F (36.5 C) 97.7 F (36.5 C)  TempSrc: Oral Oral Oral Oral  Resp: '13 20 16 20  '$ Height:      Weight:      SpO2: 96% 100% 97% 98%    Last BM Date: 02/23/15  Intake/Output   Yesterday:  02/11 0701 - 02/12 0700 In: 2980 [P.O.:180; I.V.:2500; IV Piggyback:300] Out: 4492 [Urine:1400; Drains:25] This shift:  Total I/O In: -  Out: 1 [Stool:1]  Bowel function:  Flatus: y  BM: y  Drain: scant old blood  Physical Exam:  General: Pt awake/alert/oriented x4 in no acute distress.  Tired/depressed Eyes: PERRL, normal EOM.  Sclera clear.  No icterus Neuro: CN II-XII intact w/o focal sensory/motor deficits. Lymph: No head/neck/groin lymphadenopathy Psych:  No delerium/psychosis/paranoia HENT: Normocephalic, Mucus membranes moist.  No thrush Neck: Supple, No tracheal deviation Chest: No chest wall pain w good excursion.  Dec BS at bases ? Mild crackles CV:  Pulses intact.  Regular rhythm MS: Normal AROM mjr joints.  No obvious deformity Abdomen: Soft.  Mildly distended.  Mildly  tender at incisions only.  No evidence of peritonitis.  No incarcerated hernias. Ext:  SCDs BLE.  No mjr edema.  No cyanosis Skin: No petechiae / purpura  Results:   Labs: Results for orders placed or performed during the hospital encounter of 02/25/2015 (from the past 48 hour(s))  CBC with Differential/Platelet     Status: Abnormal   Collection Time: 02/25/15  4:10 AM  Result Value Ref Range   WBC 24.4 (H) 4.0 - 10.5 K/uL    Comment: WHITE COUNT CONFIRMED ON SMEAR REPEATED TO VERIFY    RBC 3.20 (L) 4.22 - 5.81 MIL/uL   Hemoglobin 9.2  (L) 13.0 - 17.0 g/dL   HCT 28.2 (L) 39.0 - 52.0 %   MCV 88.1 78.0 - 100.0 fL   MCH 28.8 26.0 - 34.0 pg   MCHC 32.6 30.0 - 36.0 g/dL   RDW 16.4 (H) 11.5 - 15.5 %   Platelets 17 (LL) 150 - 400 K/uL    Comment: REPEATED TO VERIFY CRITICAL RESULT CALLED TO, READ BACK BY AND VERIFIED WITH: MCNULTY,J RN [phone] [place],N SPECIMEN CHECKED FOR CLOTS PLATELET COUNT CONFIRMED BY SMEAR    Neutrophils Relative % 41 %   Lymphocytes Relative 10 %   Monocytes Relative 49 %   Eosinophils Relative 0 %   Basophils Relative 0 %   Neutro Abs 10.0 (H) 1.7 - 7.7 K/uL   Lymphs Abs 2.4 0.7 - 4.0 K/uL   Monocytes Absolute 12.0 (H) 0.1 - 1.0 K/uL   Eosinophils Absolute 0.0 0.0 - 0.7 K/uL   Basophils Absolute 0.0 0.0 - 0.1 K/uL   RBC Morphology POLYCHROMASIA PRESENT     Comment: TEARDROP CELLS   WBC Morphology BLASTS   Basic metabolic panel     Status: Abnormal   Collection Time: 02/25/15  4:10 AM  Result Value Ref Range   Sodium 143 135 - 145 mmol/L   Potassium 3.2 (L) 3.5 - 5.1 mmol/L   Chloride 110 101 - 111 mmol/L   CO2 21 (L) 22 - 32 mmol/L   Glucose, Bld 89 65 - 99 mg/dL   BUN 10 6 - 20 mg/dL   Creatinine, Ser 0.99 0.61 - 1.24 mg/dL   Calcium 7.7 (L) 8.9 - 10.3 mg/dL   GFR calc non Af Amer >60 >60 mL/min   GFR calc Af Amer >60 >60 mL/min    Comment: (NOTE) The eGFR has been calculated using the CKD EPI equation. This calculation has not been validated in all clinical situations. eGFR's persistently <60 mL/min signify possible Chronic Kidney Disease.    Anion gap 12 5 - 15  Magnesium     Status: Abnormal   Collection Time: 02/25/15  4:17 AM  Result Value Ref Range   Magnesium 1.5 (L) 1.7 - 2.4 mg/dL  CBC with Differential/Platelet     Status: Abnormal   Collection Time: 02/26/15  6:20 AM  Result Value Ref Range   WBC 27.5 (H) 4.0 - 10.5 K/uL   RBC 3.23 (L) 4.22 - 5.81 MIL/uL   Hemoglobin 9.4 (L) 13.0 - 17.0 g/dL   HCT 27.5 (L) 39.0 - 52.0 %   MCV 85.1 78.0 - 100.0 fL   MCH  29.1 26.0 - 34.0 pg   MCHC 34.2 30.0 - 36.0 g/dL   RDW 16.2 (H) 11.5 - 15.5 %   Platelets 19 (LL) 150 - 400 K/uL    Comment: CRITICAL VALUE NOTED.  VALUE IS CONSISTENT WITH PREVIOUSLY REPORTED AND CALLED VALUE. REPEATED TO  VERIFY    Neutrophils Relative % 48 %   Lymphocytes Relative 18 %   Monocytes Relative 31 %   Eosinophils Relative 0 %   Basophils Relative 0 %   Band Neutrophils 0 %   Metamyelocytes Relative 0 %   Myelocytes 0 %   Promyelocytes Absolute 0 %   Blasts 3 %   nRBC 0 0 /100 WBC   Neutro Abs 13.2 (H) 1.7 - 7.7 K/uL   Lymphs Abs 5.0 (H) 0.7 - 4.0 K/uL   Monocytes Absolute 8.5 (H) 0.1 - 1.0 K/uL   Eosinophils Absolute 0.0 0.0 - 0.7 K/uL   Basophils Absolute 0.0 0.0 - 0.1 K/uL   WBC Morphology BLASTS     Comment: DOHLE BODIES  Protime-INR     Status: None   Collection Time: 02/26/15  6:20 AM  Result Value Ref Range   Prothrombin Time 15.2 11.6 - 15.2 seconds   INR 1.19 0.00 - 1.49  APTT     Status: None   Collection Time: 02/26/15  6:20 AM  Result Value Ref Range   aPTT 35 24 - 37 seconds  Basic metabolic panel     Status: Abnormal   Collection Time: 02/26/15  6:20 AM  Result Value Ref Range   Sodium 138 135 - 145 mmol/L   Potassium 3.1 (L) 3.5 - 5.1 mmol/L   Chloride 107 101 - 111 mmol/L   CO2 19 (L) 22 - 32 mmol/L   Glucose, Bld 99 65 - 99 mg/dL   BUN 10 6 - 20 mg/dL   Creatinine, Ser 0.91 0.61 - 1.24 mg/dL   Calcium 7.3 (L) 8.9 - 10.3 mg/dL   GFR calc non Af Amer >60 >60 mL/min   GFR calc Af Amer >60 >60 mL/min    Comment: (NOTE) The eGFR has been calculated using the CKD EPI equation. This calculation has not been validated in all clinical situations. eGFR's persistently <60 mL/min signify possible Chronic Kidney Disease.    Anion gap 12 5 - 15  Magnesium     Status: None   Collection Time: 02/26/15  6:20 AM  Result Value Ref Range   Magnesium 1.8 1.7 - 2.4 mg/dL    Imaging / Studies: No results found.  Medications / Allergies: per  chart  Antibiotics: Anti-infectives    Start     Dose/Rate Route Frequency Ordered Stop   02/23/2015 2200  fluconazole (DIFLUCAN) IVPB 200 mg    Comments:  May convert to PO if patient taking SOLID food well   200 mg 100 mL/hr over 60 Minutes Intravenous Every 24 hours 02/28/2015 2154 02/27/15 2159   02/19/15 1000  fluconazole (DIFLUCAN) tablet 200 mg  Status:  Discontinued     200 mg Oral Daily 02/19/15 0850 03/13/2015 2154        Note: Portions of this report may have been transcribed using voice recognition software. Every effort was made to ensure accuracy; however, inadvertent computerized transcription errors may be present.   Any transcriptional errors that result from this process are unintentional.     Adin Hector, M.D., F.A.C.S. Gastrointestinal and Minimally Invasive Surgery [facility] [address], [place], [place] [address] [phone] Main / Paging   02/26/2015  CARE TEAM:  PCP: Shirline Frees, MD  Outpatient Care Team: Patient Care Team: Shirline Frees, MD as PCP - General (Family Medicine) Coralie Keens, MD as Consulting Physician (General Surgery) Volanda Napoleon, MD as Consulting Physician (Oncology)  Inpatient Treatment Team: Treatment Team: Attending Provider: Debbe Odea, MD; Rounding Team: Joycelyn Das, MD; Consulting Physician: Volanda Napoleon, MD; Consulting Physician: Nolon Nations, MD; Consulting Physician: Alphonsa Overall, MD; Registered Nurse: Clearence Ped, RN; Registered Nurse: Lupita Shutter., RN; Technician: Gardiner Ramus, NT; Technician: Darrick Grinder, NT; Registered Nurse: Jacquelin Hawking, RN; Registered Nurse: Grafton Folk, RN; Technician: Annette Stable, NT

## 2015-02-26 NOTE — Progress Notes (Addendum)
[facility] Progress Note   [name]  WIO:[id number]  DOB: October 28, 1945  DOA: 02/15/2015 PCP: Shirline Frees, MD  Brief narrative: [name] is a 70 y.o. male with myelodysplastic syndrome and resultant pancytopenia who presents to the hospital for left flank and left upper abdominal pain. He is found to have significant splenomegaly and is noted to have transition of myelodysplastic syndrome to AML. It was recommended that I oncology he have a splenectomy. He has been receiving multiple transfusions for his pancytopenia.   Subjective: Complains of shortness of breath today with exertion. No cough. No nausea, vomiting. Abdominal pain controlled with medications  Assessment/Plan: Principal Problem:   AML (acute myeloid leukemia) with splenomegaly, pancytopenia and acquired coagulopathy with elevated INR - MDS has converted to AML per CT guided bone marrow biopsy that was performed on 02/17/15 - Oncology plans on palliative chemotherapy - s/p splenectomy on 2/8 - Continue supportive transfusions as needed- has received numerous red blood cell, FFP and platelet transfusions - Received vitamin K for elevated INR on 2/72 - received pneumococcal/meningococcal/Haemophilus vaccines - only mild oozing from JP drain- will hold off on another platelet transfusion    Active Problems:  Dyspnea - CXR suggests mild fluid overload- not hypoxic- no crackles on exam - one time lasix given by surgical team - not on IVF currently but if I and O are accurate, he is close to 12 L positive balance (likely not accurate)  S/p splenectomy - surgery assisting with management - tapered from, 50 mcg of Fentanyl patch to 25 mcg- Dr Johney Maine increasing back up to 50 mcg today - IV Dilaudid discontinued  Hypokalemia - due to lack of PO intake- will replace via IV and recheck tomorrow  Thrush -nearly resolved- Diflucan given for 5 days - can continue Nystatin for a few more days  Coronary  atherosclerosis of native coronary artery  Hyperlipidemia -On simvastatin  History of DVT -Currently has an IVC filter as we are unable to anticoagulate in setting of thrombocytopenia and coagulopathy    Antibiotics: Anti-infectives    Start     Dose/Rate Route Frequency Ordered Stop   03/03/2015 2200  fluconazole (DIFLUCAN) IVPB 200 mg    Comments:  May convert to PO if patient taking SOLID food well   200 mg 100 mL/hr over 60 Minutes Intravenous Every 24 hours 02/27/2015 2154 02/27/15 2159   02/19/15 1000  fluconazole (DIFLUCAN) tablet 200 mg  Status:  Discontinued     200 mg Oral Daily 02/19/15 0850 02/21/2015 2154     Code Status:     Code Status Orders        Start     Ordered   03/06/2015 1826  Full code   Continuous     03/02/2015 1825    Code Status History    Date Active Date Inactive Code Status Order ID Comments User Context   11/02/2014  4:34 AM 11/08/2014  6:12 PM Full Code [id number]  Ivor Costa, MD ED   10/11/2014  1:20 PM 10/17/2014  5:19 PM Full Code [id number]  Freddrick March, PA-C Inpatient   10/09/2014  9:23 PM 10/11/2014  1:20 PM Full Code [id number]  Ivin Poot, MD ED   09/18/2014  1:22 PM 09/20/2014  7:56 PM Full Code [id number]  Jacqulynn Cadet, MD Inpatient   09/14/2014  5:27 PM 09/18/2014  1:22 PM Full Code [id number]  Kelvin Cellar, MD Inpatient   04/01/2014 10:56 AM 04/01/2014  9:29 PM Full Code [id number]  Jacqulynn Cadet, MD Inpatient   03/30/2014  8:52 PM 04/01/2014 10:56 AM Full Code [id number]  Deneise Lever, MD ED   08/04/2013  9:37 AM 08/06/2013  7:19 PM Full Code [id number]  Nita Sells, MD Inpatient   07/02/2013  2:19 PM 07/03/2013  7:04 PM Full Code [id number]  Ulyses Amor, PA-C Inpatient   06/13/2013  7:57 PM 06/21/2013  8:52 PM Full Code [id number]  Otho Bellows, MD Inpatient   06/05/2013  6:39 PM 06/12/2013  5:29 PM Full Code [id number]  Clinton Gallant, MD Inpatient    Advance Directive Documentation        Most Recent Value   Type of Advance Directive   Living will, Healthcare Power of Attorney   Pre-existing out of facility DNR order (yellow form or pink MOST form)     "MOST" Form in Place?       Family Communication: wife  Disposition Plan: - uncertain when will be discharged DVT prophylaxis: SCDs due to severe thrombocytopenia and elevated INR Consultants: oncology, gen surgery Procedures: Bone marrow biopsy, splenectomy    Objective: Filed Weights   02/17/2015 1845 02/17/2015 1650 02/23/15 0400  Weight: 74 kg (163 lb 2.3 oz) 79.1 kg (174 lb 6.1 oz) 79.9 kg (176 lb 2.4 oz)    Intake/Output Summary (Last 24 hours) at 02/26/15 1118 Last data filed at 02/26/15 0850  Gross per 24 hour  Intake   2980 ml  Output   1276 ml  Net   1704 ml     Vitals Filed Vitals:   02/25/15 0601 02/25/15 1440 02/25/15 2153 02/26/15 0344  BP: 104/57 134/61 112/59 143/67  Pulse: 64 81 72 85  Temp: 97.8 F (36.6 C) 98.4 F (36.9 C) 97.7 F (36.5 C) 97.7 F (36.5 C)  TempSrc: Oral Oral Oral Oral  Resp: '13 20 16 20  '$ Height:      Weight:      SpO2: 96% 100% 97% 98%    Exam:  General:  Pt is alert, not in acute distress  HEENT: No icterus, No thrush, oral mucosa moist  Cardiovascular: regular rate and rhythm, S1/S2 No murmur- 2/6 aortic murmurs  Respiratory: clear to auscultation bilaterally   Abdomen: Soft, +Bowel sounds,  bloody drainage in JP drain-  non distended, no guarding  MSK: No cyanosis or clubbing- no pedal edema   Data Reviewed: Basic Metabolic Panel:  Recent Labs Lab 02/28/2015 1544 02/23/15 0307 02/24/15 0410 02/25/15 0410 02/25/15 0417 02/26/15 0620  NA 140 142 141 143  --  138  K 3.7 4.0 3.5 3.2*  --  3.1*  CL 108 108 108 110  --  107  CO2 '23 24 23 '$ 21*  --  19*  GLUCOSE 155* 93 84 89  --  99  BUN '18 16 9 10  '$ --  10  CREATININE 1.12 1.05 1.01 0.99  --  0.91  CALCIUM 7.4* 7.7* 7.4* 7.7*  --  7.3*  MG  --   --   --   --  1.5* 1.8   Liver Function Tests: No results for input(s): AST, ALT, ALKPHOS, BILITOT,  PROT, ALBUMIN in the last 168 hours. No results for input(s): LIPASE, AMYLASE in the last 168 hours. No results for input(s): AMMONIA in the last 168 hours. CBC:  Recent Labs Lab 03/05/2015 0545 02/24/2015 1544 02/23/15 0307 02/24/15 0410 02/25/15 0410 02/26/15 0620  WBC 4.3 6.5 18.7* 24.1* 24.4* 27.5*  NEUTROABS 1.9  --  7.3 11.3* 10.0* 13.2*  HGB 7.7* 10.5* 11.7* 10.0* 9.2* 9.4*  HCT 23.1* 30.4* 35.4* 29.9* 28.2* 27.5*  MCV 83.4 85.4 86.3 87.2 88.1 85.1  PLT 15* 38* 29* 44* 17* 19*   Cardiac Enzymes: No results for input(s): CKTOTAL, CKMB, CKMBINDEX, TROPONINI in the last 168 hours. BNP (last 3 results) No results for input(s): BNP in the last 8760 hours.  ProBNP (last 3 results) No results for input(s): PROBNP in the last 8760 hours.  CBG: No results for input(s): GLUCAP in the last 168 hours.  Recent Results (from the past 240 hour(s))  Surgical pcr screen     Status: None   Collection Time: 02/21/15  8:58 PM  Result Value Ref Range Status   MRSA, PCR NEGATIVE NEGATIVE Final   Staphylococcus aureus NEGATIVE NEGATIVE Final    Comment:        The Xpert SA Assay (FDA approved for NASAL specimens in patients over 44 years of age), is one component of a comprehensive surveillance program.  Test performance has been validated by [facility] for patients greater than or equal to 32 year old. It is not intended to diagnose infection nor to guide or monitor treatment.      Studies: Dg Chest 2 View  02/26/2015  CLINICAL DATA:  Shortness breath and vomiting. EXAM: CHEST  2 VIEW COMPARISON:  11/16/2014 and prior exams FINDINGS: Cardiomegaly and right Port-A-Cath with tip overlying the superior cavoatrial junction again noted. Pulmonary vascular congestion is noted. Mild interstitial prominence is identified. Small bilateral pleural effusions and mild bibasilar atelectasis noted. COPD changes identified. There is no evidence of pneumothorax or acute bony abnormality.  IMPRESSION: Pulmonary vascular congestion with mild interstitial prominence. Mild interstitial edema not excluded. Small bilateral pleural effusions, mild bibasilar atelectasis, cardiomegaly and COPD changes. Electronically Signed   By: Margarette Canada M.D.   On: 02/26/2015 10:38    Scheduled Meds:  Scheduled Meds: . acetaminophen  1,000 mg Oral TID  . antiseptic oral rinse  7 mL Mouth Rinse BID  . belladonna-PHENObarbital  5 mL Oral TID  . beta carotene w/minerals  1 tablet Oral Daily  . calcium chloride  1 g Intravenous Once  . [START ON 02/27/2015] fentaNYL  50 mcg Transdermal Q72H  . fluconazole (DIFLUCAN) IV  200 mg Intravenous Q24H  . fluticasone  2 spray Each Nare Daily  . lip balm  1 application Topical BID  . loratadine  10 mg Oral Daily  . magic mouthwash  15 mL Oral QID  . methocarbamol  1,000 mg Oral 4 times per day  . multivitamins with iron  1 tablet Oral Daily  . nystatin  5 mL Oral QID  . ondansetron  4 mg Oral TID AC & HS  . pantoprazole  80 mg Oral BID  . phytonadione  10 mg Subcutaneous Q12H  . polyethylene glycol  17 g Oral Daily  . simvastatin  20 mg Oral QHS  . sodium chloride flush  10-40 mL Intracatheter Q12H  . sodium chloride flush  3 mL Intravenous Q12H   Continuous Infusions:    Time spent on care of this patient: 35 min   [place], MD 02/26/2015, 11:18 AM  LOS: 10 days   [facility] Office  [phone] Pager - Text Page per [email or web address] If 7PM-7AM, please contact night-coverage [email or web address]

## 2015-02-26 NOTE — Progress Notes (Signed)
Pt c/o burning with urination. Fredirick Maudlin, NP paged. Will continue to monitor

## 2015-02-27 ENCOUNTER — Other Ambulatory Visit: Payer: Medicare Other

## 2015-02-27 ENCOUNTER — Encounter: Payer: Self-pay | Admitting: Hematology & Oncology

## 2015-02-27 DIAGNOSIS — R52 Pain, unspecified: Secondary | ICD-10-CM

## 2015-02-27 DIAGNOSIS — E876 Hypokalemia: Secondary | ICD-10-CM | POA: Diagnosis present

## 2015-02-27 DIAGNOSIS — R1112 Projectile vomiting: Secondary | ICD-10-CM | POA: Diagnosis present

## 2015-02-27 DIAGNOSIS — D696 Thrombocytopenia, unspecified: Secondary | ICD-10-CM

## 2015-02-27 LAB — BASIC METABOLIC PANEL
ANION GAP: 8 (ref 5–15)
Anion gap: 11 (ref 5–15)
BUN: 12 mg/dL (ref 6–20)
BUN: 11 mg/dL (ref 6–20)
CHLORIDE: 105 mmol/L (ref 101–111)
CHLORIDE: 107 mmol/L (ref 101–111)
CO2: 24 mmol/L (ref 22–32)
CO2: 24 mmol/L (ref 22–32)
Calcium: 7.5 mg/dL — ABNORMAL LOW (ref 8.9–10.3)
Calcium: 7.7 mg/dL — ABNORMAL LOW (ref 8.9–10.3)
Creatinine, Ser: 0.96 mg/dL (ref 0.61–1.24)
Creatinine, Ser: 0.9 mg/dL (ref 0.61–1.24)
GFR calc non Af Amer: >60 mL/min (ref >60)
GFR calc non Af Amer: >60 mL/min (ref >60)
Glucose, Bld: 89 mg/dL (ref 65–99)
Glucose, Bld: 119 mg/dL — ABNORMAL HIGH (ref 65–99)
POTASSIUM: 2.7 mmol/L — AB (ref 3.5–5.1)
POTASSIUM: 5.9 mmol/L — AB (ref 3.5–5.1)
SODIUM: 140 mmol/L (ref 135–145)
SODIUM: 139 mmol/L (ref 135–145)

## 2015-02-27 LAB — PREPARE FRESH FROZEN PLASMA
UNIT DIVISION: 0
UNIT DIVISION: 0
Unit division: 0
Unit division: 0

## 2015-02-27 LAB — URINALYSIS, ROUTINE W REFLEX MICROSCOPIC
Glucose, UA: NEGATIVE mg/dL
Hgb urine dipstick: NEGATIVE
Ketones, ur: >80 mg/dL — AB
Leukocytes, UA: NEGATIVE
NITRITE: NEGATIVE
Protein, ur: 30 mg/dL — AB
SPECIFIC GRAVITY, URINE: 1.015 (ref 1.005–1.030)
pH: 6 (ref 5.0–8.0)

## 2015-02-27 LAB — URINE MICROSCOPIC-ADD ON

## 2015-02-27 LAB — CBC WITH DIFFERENTIAL/PLATELET
BAND NEUTROPHILS: 0 %
BASOS PCT: 0 %
BLASTS: 11 %
Basophils Absolute: 0 10*3/uL (ref 0.0–0.1)
EOS ABS: 0 10*3/uL (ref 0.0–0.7)
Eosinophils Relative: 0 %
HCT: 27.4 % — ABNORMAL LOW (ref 39.0–52.0)
HEMOGLOBIN: 9.3 g/dL — AB (ref 13.0–17.0)
LYMPHS PCT: 7 %
Lymphs Abs: 2.6 10*3/uL (ref 0.7–4.0)
MCH: 29.2 pg (ref 26.0–34.0)
MCHC: 33.9 g/dL (ref 30.0–36.0)
MCV: 86.2 fL (ref 78.0–100.0)
MONO ABS: 25.9 10*3/uL — AB (ref 0.1–1.0)
MYELOCYTES: 1 %
Metamyelocytes Relative: 0 %
Monocytes Relative: 70 %
NEUTROS PCT: 11 %
NRBC: 0 /100{WBCs}
Neutro Abs: 4.4 10*3/uL (ref 1.7–7.7)
Other: 0 %
PROMYELOCYTES ABS: 0 %
Platelets: 6 10*3/uL — CL (ref 150–400)
RBC: 3.18 MIL/uL — ABNORMAL LOW (ref 4.22–5.81)
RDW: 16.3 % — ABNORMAL HIGH (ref 11.5–15.5)
WBC: 37 10*3/uL — ABNORMAL HIGH (ref 4.0–10.5)

## 2015-02-27 LAB — TYPE AND SCREEN
ABO/RH(D): A POS
ANTIBODY SCREEN: NEGATIVE

## 2015-02-27 LAB — CHROMOSOME ANALYSIS, BONE MARROW

## 2015-02-27 MED ORDER — SODIUM CHLORIDE 0.9 % IV SOLN
Freq: Once | INTRAVENOUS | Status: AC
  Administered 2015-02-27: 09:00:00 via INTRAVENOUS

## 2015-02-27 MED ORDER — SODIUM CHLORIDE 0.9 % IV SOLN: Freq: Once | INTRAVENOUS | Status: DC

## 2015-02-27 MED ORDER — POTASSIUM CHLORIDE 10 MEQ/50ML IV SOLN: 10.0000 meq | INTRAVENOUS | Status: DC

## 2015-02-27 MED ORDER — SODIUM CHLORIDE 0.9 % IV SOLN
8.0000 mg | Freq: Three times a day (TID) | INTRAVENOUS | Status: DC
  Administered 2015-02-27 – 2015-03-01 (×6): 8 mg via INTRAVENOUS
  Filled 2015-02-27 (×7): qty 4

## 2015-02-27 MED ORDER — ACETAMINOPHEN 325 MG PO TABS
650.0000 mg | ORAL_TABLET | Freq: Once | ORAL | Status: AC
  Administered 2015-02-27: 650 mg via ORAL
  Filled 2015-02-27: qty 2

## 2015-02-27 MED ORDER — ONDANSETRON HCL 4 MG/2ML IJ SOLN: 4.0000 mg | Freq: Four times a day (QID) | INTRAMUSCULAR | Status: DC

## 2015-02-27 MED ORDER — DIPHENHYDRAMINE HCL 25 MG PO CAPS
ORAL_CAPSULE | ORAL | Status: AC
  Administered 2015-02-27: 12:00:00
  Filled 2015-02-27: qty 1

## 2015-02-27 MED ORDER — ONDANSETRON HCL 4 MG/2ML IJ SOLN
INTRAMUSCULAR | Status: AC
  Administered 2015-02-27: 8 mg
  Filled 2015-02-27: qty 4

## 2015-02-27 MED ORDER — ENSURE ENLIVE PO LIQD: 237.0000 mL | Freq: Two times a day (BID) | ORAL | Status: DC

## 2015-02-27 MED ORDER — POTASSIUM CHLORIDE 10 MEQ/50ML IV SOLN
10.0000 meq | INTRAVENOUS | Status: AC
  Administered 2015-02-27 (×5): 10 meq via INTRAVENOUS
  Filled 2015-02-27 (×6): qty 50

## 2015-02-27 MED ORDER — PANTOPRAZOLE SODIUM 40 MG IV SOLR
40.0000 mg | Freq: Two times a day (BID) | INTRAVENOUS | Status: DC
  Administered 2015-02-27 – 2015-03-01 (×4): 40 mg via INTRAVENOUS
  Filled 2015-02-27 (×5): qty 40

## 2015-02-27 MED ORDER — POTASSIUM CHLORIDE 10 MEQ/50ML IV SOLN
10.0000 meq | Freq: Once | INTRAVENOUS | Status: DC
  Filled 2015-02-27: qty 50

## 2015-02-27 MED ORDER — FENTANYL CITRATE (PF) 100 MCG/2ML IJ SOLN
12.5000 ug | INTRAMUSCULAR | Status: DC | PRN
  Administered 2015-02-27 – 2015-03-01 (×4): 12.5 ug via INTRAVENOUS
  Filled 2015-02-27 (×4): qty 2

## 2015-02-27 MED ORDER — SODIUM CHLORIDE 0.9 % IV SOLN
INTRAVENOUS | Status: DC
  Administered 2015-02-27 – 2015-03-01 (×4): via INTRAVENOUS

## 2015-02-27 MED ORDER — POTASSIUM CHLORIDE CRYS ER 20 MEQ PO TBCR: 40.0000 meq | EXTENDED_RELEASE_TABLET | ORAL | Status: DC

## 2015-02-27 MED ORDER — DIPHENHYDRAMINE HCL 25 MG PO CAPS
25.0000 mg | ORAL_CAPSULE | Freq: Once | ORAL | Status: AC
  Filled 2015-02-27: qty 1

## 2015-02-27 MED ORDER — HYDROMORPHONE HCL 1 MG/ML IJ SOLN: 0.5000 mg | INTRAMUSCULAR | Status: DC | PRN

## 2015-02-27 MED ORDER — ONDANSETRON HCL 4 MG/2ML IJ SOLN
4.0000 mg | Freq: Four times a day (QID) | INTRAMUSCULAR | Status: DC | PRN
  Administered 2015-02-28: 4 mg via INTRAVENOUS
  Filled 2015-02-27 (×2): qty 2

## 2015-02-27 MED ORDER — METHOCARBAMOL 500 MG PO TABS
1000.0000 mg | ORAL_TABLET | Freq: Three times a day (TID) | ORAL | Status: DC
  Administered 2015-02-28 (×2): 1000 mg via ORAL
  Filled 2015-02-27 (×9): qty 2

## 2015-02-27 MED ORDER — SODIUM CHLORIDE 0.9% FLUSH: 10.0000 mL | INTRAVENOUS | Status: DC | PRN

## 2015-02-27 MED ORDER — ONDANSETRON HCL 4 MG/2ML IJ SOLN: 4.0000 mg | Freq: Four times a day (QID) | INTRAMUSCULAR | Status: DC | PRN

## 2015-02-27 NOTE — Progress Notes (Signed)
PT Cancellation Note  Patient Details Name: [name] MRN: [id number] DOB: 28-Jan-1945   Cancelled Treatment:    Reason Eval/Treat Not Completed: Patient declined, no reason specified "NO, not today"   [facility] 02/27/2015, 12:34 PM

## 2015-02-27 NOTE — Care Management Note (Signed)
Case Management Note  Patient Details  Name: [name] MRN: [id number] Date of Birth: October 12, 1945  Subjective/Objective:  PT recc HHPT. Await [place] orders.n/v-iv zofran.onc/sx following.                Action/Plan:d/c plan home w/HHC.   Expected Discharge Date:                  Expected Discharge Plan:  [place]  In-House Referral:     Discharge planning Services  CM Consult  Post Acute Care Choice:    Choice offered to:  Patient  DME Arranged:    DME Agency:     HH Arranged:    [place] Agency:     Status of Service:  In process, will continue to follow  Medicare Important Message Given:  Yes Date Medicare IM Given:    Medicare IM give by:    Date Additional Medicare IM Given:    Additional Medicare Important Message give by:     If discussed at [place] of Stay Meetings, dates discussed:    Additional Comments:  Dessa Phi, RN 02/27/2015, 11:38 AM

## 2015-02-27 NOTE — Clinical Documentation Improvement (Signed)
Oncology  Can the diagnosis of Malnutrition be further specified?   Document Severity - Severe(third degree), Moderate (second degree), Mild (first degree)  Other condition  Unable to clinically determine  Please exercise your independent, professional judgment when responding. A specific answer is not anticipated or expected.   Thank You, Rolm Gala, RN, [place] [phone]

## 2015-02-27 NOTE — Progress Notes (Signed)
[name] is now out of the ICU. He is up on 4 E. He is out of bed. He is walking a little bit.  He says he cannot take pills which is why he is having nausea and vomiting. He's had IV Zofran works well for him. I'll see about ordering this.  He is not bleeding.  His laboratory is not back yet.  His pain seems be doing better. He is having some pain yesterday. It is now improving.  He's had no problems with fever. He's having no rashes. There is no leg swelling. There is no shortness of breath.  On his physical exam, his vital signs are stable. His temperature is 90.9. Blood pressure 129/65. Pulse is 91. Head and neck exam shows no ocular or oral lesions. He has no palpable cervical or supraclavicular lymph nodes. Lungs are with good breath sounds bilaterally. He has no wheezes. Cardiac exam regular rate and rhythm with no murmurs, rubs or bruits. Abdomen is soft. He has decent bowel sounds. He has the laparoscopy scars. There is no hepatomegaly. Extremities shows no clubbing, cyanosis or edema.  His recovery is slow but steady. His platelet count is down to 6000 today. This will have to be transfused. I suspect this probably is from his underlying leukemia. His hemoglobin is holding pretty steady. His white cell count is going up which is no surprise as his spleen is no longer a depository for his blood.   Hopefully, he will be able to walk and eat a little bit better. Again we will make some medication adjustments.  [name]  Job [phone]

## 2015-02-27 NOTE — Progress Notes (Signed)
[facility] Progress Note   [name]  [id number]  DOB: 1945/12/11  DOA: 02/19/2015 PCP: Shirline Frees, MD  Brief narrative: [name] is a 70 y.o. male with myelodysplastic syndrome and resultant pancytopenia who presents to the hospital for left flank and left upper abdominal pain. He is found to have significant splenomegaly and is noted to have transition of myelodysplastic syndrome to AML. It was recommended that I oncology he have a splenectomy. He has been receiving multiple transfusions for his pancytopenia.   Subjective: Very nauseated today with an episode of vomiting this AM. No cough.Abdominal pain controlled with medications  Assessment/Plan: Principal Problem:   AML (acute myeloid leukemia) with splenomegaly, pancytopenia and acquired coagulopathy with elevated INR - MDS has converted to AML per CT guided bone marrow biopsy that was performed on 02/17/15 - Oncology plans on palliative chemotherapy - s/p splenectomy on 2/8 - Continue supportive transfusions as needed- has received numerous red blood cell, FFP and platelet transfusions - Received vitamin K for elevated INR on 2/72 - received pneumococcal/meningococcal/Haemophilus vaccines  - Dr Marin Olp has ordered another unit of platelets today    Active Problems:  Dyspnea - CXR on 2/12 suggests mild fluid overload- not hypoxic- no crackles on exam - one time lasix given by surgical team on 2/12 - no dyspnea today- will resume IVF at a low rate and monitor I and O closely  S/p splenectomy - care per surgery team  Hypokalemia - due to lack of PO intake- will replace via IV again today and recheck tomorrow  Thrush -nearly resolved- Diflucan given for 5 days - can continue Nystatin for a few more days  Coronary atherosclerosis of native coronary artery  Hyperlipidemia -On simvastatin  History of DVT -Currently has an IVC filter as we are unable to anticoagulate in setting of  thrombocytopenia and coagulopathy    Antibiotics: Anti-infectives    Start     Dose/Rate Route Frequency Ordered Stop   02/27/2015 2200  fluconazole (DIFLUCAN) IVPB 200 mg    Comments:  May convert to PO if patient taking SOLID food well   200 mg 100 mL/hr over 60 Minutes Intravenous Every 24 hours 02/15/2015 2154 02/27/15 2159   02/19/15 1000  fluconazole (DIFLUCAN) tablet 200 mg  Status:  Discontinued     200 mg Oral Daily 02/19/15 0850 02/21/2015 2154     Code Status:     Code Status Orders        Start     Ordered   03/07/2015 1826  Full code   Continuous     03/07/2015 1825    Code Status History    Date Active Date Inactive Code Status Order ID Comments User Context   11/02/2014  4:34 AM 11/08/2014  6:12 PM Full Code [id number]  Ivor Costa, MD ED   10/11/2014  1:20 PM 10/17/2014  5:19 PM Full Code [id number]  Freddrick March, PA-C Inpatient   10/09/2014  9:23 PM 10/11/2014  1:20 PM Full Code [id number]  Ivin Poot, MD ED   09/18/2014  1:22 PM 09/20/2014  7:56 PM Full Code [id number]  Jacqulynn Cadet, MD Inpatient   09/14/2014  5:27 PM 09/18/2014  1:22 PM Full Code [id number]  Kelvin Cellar, MD Inpatient   04/01/2014 10:56 AM 04/01/2014  9:29 PM Full Code [id number]  Jacqulynn Cadet, MD Inpatient   03/30/2014  8:52 PM 04/01/2014 10:56 AM Full Code [id number]  Deneise Lever, MD ED   08/04/2013  9:37 AM  08/06/2013  7:19 PM Full Code [id number]  Nita Sells, MD Inpatient   07/02/2013  2:19 PM 07/03/2013  7:04 PM Full Code [id number]  Ulyses Amor, PA-C Inpatient   06/13/2013  7:57 PM 06/21/2013  8:52 PM Full Code [id number]  Otho Bellows, MD Inpatient   06/05/2013  6:39 PM 06/12/2013  5:29 PM Full Code [id number]  Clinton Gallant, MD Inpatient    Advance Directive Documentation        Most Recent Value   Type of Advance Directive  Living will, Healthcare Power of Attorney   Pre-existing out of facility DNR order (yellow form or pink MOST form)     "MOST" Form in Place?       Family  Communication: wife  Disposition Plan: - uncertain when will be discharged DVT prophylaxis: SCDs due to severe thrombocytopenia and elevated INR Consultants: oncology, gen surgery Procedures: Bone marrow biopsy, splenectomy    Objective: Filed Weights   02/17/2015 1845 02/26/2015 1650 02/23/15 0400  Weight: 74 kg (163 lb 2.3 oz) 79.1 kg (174 lb 6.1 oz) 79.9 kg (176 lb 2.4 oz)    Intake/Output Summary (Last 24 hours) at 02/27/15 1503 Last data filed at 02/27/15 1407  Gross per 24 hour  Intake    314 ml  Output    940 ml  Net   -626 ml     Vitals Filed Vitals:   02/27/15 1150 02/27/15 1225 02/27/15 1348 02/27/15 1415  BP: 129/53 123/54 122/60 115/56  Pulse: 89 88 85 79  Temp: 99.8 F (37.7 C) 100 F (37.8 C) 99.3 F (37.4 C) 99.3 F (37.4 C)  TempSrc: Oral Oral Oral Oral  Resp: _0 Height:      Weight:      SpO2: 95% 97%  96%    Exam:  General:  Pt is alert, not in acute distress  HEENT: No icterus, No thrush, oral mucosa moist  Cardiovascular: regular rate and rhythm, S1/S2 No murmur- 2/6 aortic murmurs  Respiratory: clear to auscultation bilaterally   Abdomen: Soft, +Bowel sounds,  bloody drainage in JP drain-  non distended, no guarding  MSK: No cyanosis or clubbing- no pedal edema   Data Reviewed: Basic Metabolic Panel:  Recent Labs Lab 02/23/15 0307 02/24/15 0410 02/25/15 0410 02/25/15 0417 02/26/15 0620 02/27/15 0610  NA 142 141 143  --  138 140  K 4.0 3.5 3.2*  --  3.1* 2.7*  CL 108 108 110  --  107 105  CO2 24 23 21*  --  19* 24  GLUCOSE 93 84 89  --  99 89  BUN _1 --  10 12  CREATININE 1.05 1.01 0.99  --  0.91 0.96  CALCIUM 7.7* 7.4* 7.7*  --  7.3* 7.5*  MG  --   --   --  1.5* 1.8  --    Liver Function Tests: No results for input(s): AST, ALT, ALKPHOS, BILITOT, PROT, ALBUMIN in the last 168 hours. No results for input(s): LIPASE, AMYLASE in the last 168 hours. No results for input(s): AMMONIA in the last 168  hours. CBC:  Recent Labs Lab 02/23/15 0307 02/24/15 0410 02/25/15 0410 02/26/15 0620 02/27/15 0610  WBC 18.7* 24.1* 24.4* 27.5* 37.0*  NEUTROABS 7.3 11.3* 10.0* 13.2* 4.4  HGB 11.7* 10.0* 9.2* 9.4* 9.3*  HCT 35.4* 29.9* 28.2* 27.5* 27.4*  MCV 86.3 87.2 88.1 85.1 86.2  PLT 29* 44* 17* 19* 6*   Cardiac Enzymes: No  results for input(s): CKTOTAL, CKMB, CKMBINDEX, TROPONINI in the last 168 hours. BNP (last 3 results) No results for input(s): BNP in the last 8760 hours.  ProBNP (last 3 results) No results for input(s): PROBNP in the last 8760 hours.  CBG: No results for input(s): GLUCAP in the last 168 hours.  Recent Results (from the past 240 hour(s))  Surgical pcr screen     Status: None   Collection Time: 02/21/15  8:58 PM  Result Value Ref Range Status   MRSA, PCR NEGATIVE NEGATIVE Final   Staphylococcus aureus NEGATIVE NEGATIVE Final    Comment:        The Xpert SA Assay (FDA approved for NASAL specimens in patients over 2 years of age), is one component of a comprehensive surveillance program.  Test performance has been validated by [facility] for patients greater than or equal to 50 year old. It is not intended to diagnose infection nor to guide or monitor treatment.      Studies: Dg Chest 2 View  02/26/2015  CLINICAL DATA:  Shortness breath and vomiting. EXAM: CHEST  2 VIEW COMPARISON:  11/16/2014 and prior exams FINDINGS: Cardiomegaly and right Port-A-Cath with tip overlying the superior cavoatrial junction again noted. Pulmonary vascular congestion is noted. Mild interstitial prominence is identified. Small bilateral pleural effusions and mild bibasilar atelectasis noted. COPD changes identified. There is no evidence of pneumothorax or acute bony abnormality. IMPRESSION: Pulmonary vascular congestion with mild interstitial prominence. Mild interstitial edema not excluded. Small bilateral pleural effusions, mild bibasilar atelectasis, cardiomegaly and COPD  changes. Electronically Signed   By: Margarette Canada M.D.   On: 02/26/2015 10:38    Scheduled Meds:  Scheduled Meds: . acetaminophen  1,000 mg Oral TID  . belladonna-PHENObarbital  5 mL Oral TID  . feeding supplement (ENSURE ENLIVE)  237 mL Oral BID BM  . fentaNYL  50 mcg Transdermal Q72H  . fluconazole (DIFLUCAN) IV  200 mg Intravenous Q24H  . fluticasone  2 spray Each Nare Daily  . lip balm  1 application Topical BID  . loratadine  10 mg Oral Daily  . magic mouthwash  15 mL Oral QID  . methocarbamol  1,000 mg Oral TID  . nystatin  5 mL Oral QID  . ondansetron (ZOFRAN) IV  8 mg Intravenous 3 times per day  . pantoprazole (PROTONIX) IV  40 mg Intravenous Q12H  . polyethylene glycol  17 g Oral Daily  . potassium chloride  10 mEq Intravenous Q1 Hr x 6  . sodium chloride flush  10-40 mL Intracatheter Q12H   Continuous Infusions: . sodium chloride 75 mL/hr at 02/27/15 1442    Time spent on care of this patient: 35 min   [place], MD 02/27/2015, 3:03 PM  LOS: 11 days   [facility] Office  [phone] Pager - Text Page per [email or web address] If 7PM-7AM, please contact night-coverage [email or web address]

## 2015-02-27 NOTE — Progress Notes (Signed)
5 Days Post-Op  Subjective: Nauseated vomited last PM, unable to eat,  having pain.  No IV fluids.  He feels terrible, had a really bad night, his wife is very concerned and anxious over him also.  IV stuff was stopped, he was diuresed a good deal yesterday, can't take PO without vomiting.  Objective: Vital signs in last 24 hours: Temp:  [98 F (36.7 C)-99 F (37.2 C)] 99 F (37.2 C) (02/13 [id number]) Pulse Rate:  [73-91] 91 (02/13 0613) Resp:  [20] 20 (02/13 0613) BP: (107-129)/(56-65) 129/65 mmHg (02/13 0613) SpO2:  [96 %-98 %] 96 % (02/13 0613) Last BM Date: 02/26/15 PO none recorded,  Diet  D1 Urine:  2025 Stool 3? Afebrile, VSS WBC 37K H/h is stable, Platelets 6000 Getting platelets now CXR yesterday:  Pulmonary vascular congestion with mild interstitial prominence. Mild interstitial edema not excluded.  Small bilateral pleural effusions, mild bibasilar atelectasis, cardiomegaly and COPD changes.  Intake/Output from previous day: 02/12 0701 - 02/13 0700 In: 80 [I.V.:80] Out: 2068 [Urine:2025; Drains:40; Stool:3] Intake/Output this shift:    General appearance: alert, cooperative and he feels terrible. Resp: some rales and BS diminshed. GI: soft, sites OK + BS, having abdominal pain, + flatus Skin: Skin color, texture, turgor normal. No rashes or lesions or brusing all over from low platelets  Lab Results:   Recent Labs  02/26/15 0620 02/27/15 0610  WBC 27.5* 37.0*  HGB 9.4* 9.3*  HCT 27.5* 27.4*  PLT 19* 6*    BMET  Recent Labs  02/26/15 0620 02/27/15 0610  NA 138 140  K 3.1* 2.7*  CL 107 105  CO2 19* 24  GLUCOSE 99 89  BUN 10 12  CREATININE 0.91 0.96  CALCIUM 7.3* 7.5*   PT/INR  Recent Labs  02/26/15 0620  LABPROT 15.2  INR 1.19    No results for input(s): AST, ALT, ALKPHOS, BILITOT, PROT, ALBUMIN in the last 168 hours.   Lipase     Component Value Date/Time   LIPASE 30 11/20/2014 1330     Studies/Results: Dg Chest 2 View  02/26/2015   CLINICAL DATA:  Shortness breath and vomiting. EXAM: CHEST  2 VIEW COMPARISON:  11/16/2014 and prior exams FINDINGS: Cardiomegaly and right Port-A-Cath with tip overlying the superior cavoatrial junction again noted. Pulmonary vascular congestion is noted. Mild interstitial prominence is identified. Small bilateral pleural effusions and mild bibasilar atelectasis noted. COPD changes identified. There is no evidence of pneumothorax or acute bony abnormality. IMPRESSION: Pulmonary vascular congestion with mild interstitial prominence. Mild interstitial edema not excluded. Small bilateral pleural effusions, mild bibasilar atelectasis, cardiomegaly and COPD changes. Electronically Signed   By: Margarette Canada M.D.   On: 02/26/2015 10:38    Medications: . acetaminophen  1,000 mg Oral TID  . acetaminophen  650 mg Oral Once  . belladonna-PHENObarbital  5 mL Oral TID  . diphenhydrAMINE  25 mg Oral Once  . fentaNYL  50 mcg Transdermal Q72H  . fluconazole (DIFLUCAN) IV  200 mg Intravenous Q24H  . fluticasone  2 spray Each Nare Daily  . lip balm  1 application Topical BID  . loratadine  10 mg Oral Daily  . magic mouthwash  15 mL Oral QID  . methocarbamol  1,000 mg Oral TID  . nystatin  5 mL Oral QID  . ondansetron (ZOFRAN) IV  8 mg Intravenous 3 times per day  . pantoprazole (PROTONIX) IV  40 mg Intravenous Q12H  . polyethylene glycol  17 g Oral Daily  .  sodium chloride flush  10-40 mL Intracatheter Q12H      Assessment/Plan Symptomatic splenomegaly, S/p Splenectomy - 02/19/2015 - D. Newman Myelodysplastic syndrome, now with acute myeloid leukemia Thrombocytopenia Anemia Hx of CAD/MI 2010/AAA endovascular stent for AAA Prior VATS/COPD IVC for DVT/no heparin for Thrombocytopenia/SCD  Plan:  I added back the Dilaudid IV for additional pain coverage, He has IV Zofran ordered and we are moving that up.  Dr. Wynelle Cleveland is going to see and decide on fluids.  He is doing fairly well from his splenectomy.  BS and  Bowel function are back.  He is just not able to take PO secondary to the nausea.       LOS: 11 days    [name] 02/27/2015

## 2015-02-28 ENCOUNTER — Other Ambulatory Visit: Payer: Self-pay | Admitting: Hematology & Oncology

## 2015-02-28 DIAGNOSIS — C9202 Acute myeloblastic leukemia, in relapse: Secondary | ICD-10-CM

## 2015-02-28 LAB — PREPARE PLATELET PHERESIS: Unit division: 0

## 2015-02-28 LAB — BASIC METABOLIC PANEL
ANION GAP: 13 (ref 5–15)
BUN: 13 mg/dL (ref 6–20)
CHLORIDE: 107 mmol/L (ref 101–111)
CO2: 23 mmol/L (ref 22–32)
Calcium: 7.9 mg/dL — ABNORMAL LOW (ref 8.9–10.3)
Creatinine, Ser: 1.04 mg/dL (ref 0.61–1.24)
GFR calc Af Amer: >60 mL/min (ref >60)
GFR calc non Af Amer: >60 mL/min (ref >60)
GLUCOSE: 92 mg/dL (ref 65–99)
POTASSIUM: 3 mmol/L — AB (ref 3.5–5.1)
Sodium: 143 mmol/L (ref 135–145)

## 2015-02-28 LAB — CBC WITH DIFFERENTIAL/PLATELET
BLASTS: 18 %
Band Neutrophils: 0 %
Basophils Absolute: 0 10*3/uL (ref 0.0–0.1)
Basophils Relative: 0 %
EOS PCT: 0 %
Eosinophils Absolute: 0 10*3/uL (ref 0.0–0.7)
HCT: 26.2 % — ABNORMAL LOW (ref 39.0–52.0)
HEMOGLOBIN: 8.7 g/dL — AB (ref 13.0–17.0)
Lymphocytes Relative: 2 %
Lymphs Abs: 1.5 10*3/uL (ref 0.7–4.0)
MCH: 28.7 pg (ref 26.0–34.0)
MCHC: 33.2 g/dL (ref 30.0–36.0)
MCV: 86.5 fL (ref 78.0–100.0)
MYELOCYTES: 0 %
Metamyelocytes Relative: 0 %
Monocytes Absolute: 48.4 10*3/uL — ABNORMAL HIGH (ref 0.1–1.0)
Monocytes Relative: 65 %
NEUTROS PCT: 15 %
NRBC: 0 /100{WBCs}
Neutro Abs: 11.2 10*3/uL — ABNORMAL HIGH (ref 1.7–7.7)
Other: 0 %
Platelets: 23 10*3/uL — CL (ref 150–400)
Promyelocytes Absolute: 0 %
RBC: 3.03 MIL/uL — AB (ref 4.22–5.81)
RDW: 16.6 % — ABNORMAL HIGH (ref 11.5–15.5)
WBC: 74.4 10*3/uL — AB (ref 4.0–10.5)

## 2015-02-28 LAB — MAGNESIUM: MAGNESIUM: 1.6 mg/dL — AB (ref 1.7–2.4)

## 2015-02-28 MED ORDER — DIPHENHYDRAMINE HCL 12.5 MG/5ML PO ELIX: 25.0000 mg | ORAL_SOLUTION | Freq: Four times a day (QID) | ORAL | Status: DC | PRN

## 2015-02-28 MED ORDER — OXYCODONE HCL 5 MG/5ML PO SOLN
5.0000 mg | ORAL | Status: DC | PRN
  Administered 2015-02-28: 10 mg via ORAL
  Filled 2015-02-28: qty 10

## 2015-02-28 MED ORDER — ACETAMINOPHEN 160 MG/5ML PO SOLN
1000.0000 mg | Freq: Four times a day (QID) | ORAL | Status: DC
  Administered 2015-02-28: 1000 mg via ORAL
  Filled 2015-02-28 (×6): qty 40

## 2015-02-28 MED ORDER — VENETOCLAX 10 & 50 & 100 MG PO TBPK: 10.0000 mg | ORAL_TABLET | Freq: Every day | ORAL | Status: AC

## 2015-02-28 MED ORDER — ACETAMINOPHEN 160 MG/5ML PO SOLN: 650.0000 mg | ORAL | Status: DC | PRN

## 2015-02-28 MED ORDER — ENSURE ENLIVE PO LIQD: 237.0000 mL | Freq: Two times a day (BID) | ORAL | Status: DC

## 2015-02-28 MED ORDER — MAGNESIUM SULFATE 2 GM/50ML IV SOLN
2.0000 g | Freq: Once | INTRAVENOUS | Status: AC
  Administered 2015-02-28: 2 g via INTRAVENOUS
  Filled 2015-02-28: qty 50

## 2015-02-28 MED ORDER — POTASSIUM CHLORIDE 10 MEQ/100ML IV SOLN
10.0000 meq | INTRAVENOUS | Status: AC
  Administered 2015-02-28 (×4): 10 meq via INTRAVENOUS
  Filled 2015-02-28 (×4): qty 100

## 2015-02-28 MED ORDER — DIPHENHYDRAMINE HCL 12.5 MG/5ML PO ELIX: 25.0000 mg | ORAL_SOLUTION | ORAL | Status: DC | PRN

## 2015-02-28 NOTE — Progress Notes (Signed)
OT Cancellation Note  Patient Details Name: [name] MRN: [id number] DOB: 1945-04-10   Cancelled Treatment:    Reason Eval/Treat Not Completed: Pain limiting ability to participate -- Patient reports "not a good time." Nurse present in room administering pain medications. Will check back at a later time as time permits.  Carmellia Kreisler A 02/28/2015, 9:05 AM

## 2015-02-28 NOTE — Progress Notes (Signed)
OT Cancellation Note  Patient Details Name: [name] MRN: [id number] DOB: 04/10/1945   Cancelled Treatment:    Reason Eval/Treat Not Completed: Medical issues which prohibited therapy -- Checked back in the afternoon. Patient reports continued pain and nausea and does not wish to participate in OT evaluation today. OT will check back tomorrow for evaluation.  Laurali Goddard A 02/28/2015, 12:42 PM

## 2015-02-28 NOTE — Progress Notes (Signed)
Called to pt room.  Pt vomiting.  Zofran recently given.  Vital obtained & stable.  Will continue to monitor. Stacey Drain

## 2015-02-28 NOTE — Progress Notes (Signed)
Pt refused to ambulate today. After several attempts and RN encouragement pt did sit on side of bed and attempt to stand and take a few steps in room.  Stacey Drain

## 2015-02-28 NOTE — Progress Notes (Signed)
6 Days Post-Op  Subjective: He looks like he is trying to vomit, but is really coughing up his meds.  He says he can't swallow the pills and crushed up they are bitter.  He has some bloody looking sputum on his gown he coughed up is passing gas and even had a BM, but BS very hypoactive.    Objective: Vital signs in last 24 hours: Temp:  [97.9 F (36.6 C)-100 F (37.8 C)] 97.9 F (36.6 C) (02/14 0858) Pulse Rate:  [79-102] 102 (02/14 0858) Resp:  [12-18] 16 (02/14 0858) BP: (115-139)/(53-62) 129/62 mmHg (02/14 0858) SpO2:  [93 %-97 %] 94 % (02/14 0858) Last BM Date: 02/27/15 Transfused yesterday with platelets  Urine 915 BM x 3 yesterday Afebrile, VSS K+ 3.0 WBC up to 74.4, platelets are 23 K Intake/Output from previous day: 02/13 0701 - 02/14 0700 In: 1893.3 [I.V.:1551.3; Blood:234; IV Piggyback:108] Out: [id number] [Urine:915] Intake/Output this shift:    General appearance: alert, cooperative, mild distress and coughing up pills or trying to spit up after taking pills. Resp: clear to auscultation bilaterally GI: He has BS, hypoacitve but present. +BM recorded.  The main incision  has what is probally a hematoma at the upper border. he has some ecchymosis on his abdomen but all the incsions are fine.  i took down all the steri strips too and these are all fine.    Lab Results:   Recent Labs  02/27/15 0610 02/28/15 0545  WBC 37.0* 74.4*  HGB 9.3* 8.7*  HCT 27.4* 26.2*  PLT 6* 23*    BMET  Recent Labs  02/27/15 1809 02/28/15 0545  NA 139 143  K 5.9* 3.0*  CL 107 107  CO2 24 23  GLUCOSE 119* 92  BUN 11 13  CREATININE 0.90 1.04  CALCIUM 7.7* 7.9*   PT/INR  Recent Labs  02/26/15 0620  LABPROT 15.2  INR 1.19    No results for input(s): AST, ALT, ALKPHOS, BILITOT, PROT, ALBUMIN in the last 168 hours.   Lipase     Component Value Date/Time   LIPASE 30 11/20/2014 1330     Studies/Results: Dg Chest 2 View  02/26/2015  CLINICAL DATA:  Shortness breath and  vomiting. EXAM: CHEST  2 VIEW COMPARISON:  11/16/2014 and prior exams FINDINGS: Cardiomegaly and right Port-A-Cath with tip overlying the superior cavoatrial junction again noted. Pulmonary vascular congestion is noted. Mild interstitial prominence is identified. Small bilateral pleural effusions and mild bibasilar atelectasis noted. COPD changes identified. There is no evidence of pneumothorax or acute bony abnormality. IMPRESSION: Pulmonary vascular congestion with mild interstitial prominence. Mild interstitial edema not excluded. Small bilateral pleural effusions, mild bibasilar atelectasis, cardiomegaly and COPD changes. Electronically Signed   By: Margarette Canada M.D.   On: 02/26/2015 10:38    Medications: . acetaminophen  1,000 mg Oral TID  . belladonna-PHENObarbital  5 mL Oral TID  . feeding supplement (ENSURE ENLIVE)  237 mL Oral BID BM  . fentaNYL  50 mcg Transdermal Q72H  . fluticasone  2 spray Each Nare Daily  . lip balm  1 application Topical BID  . loratadine  10 mg Oral Daily  . magic mouthwash  15 mL Oral QID  . methocarbamol  1,000 mg Oral TID  . nystatin  5 mL Oral QID  . ondansetron (ZOFRAN) IV  8 mg Intravenous 3 times per day  . pantoprazole (PROTONIX) IV  40 mg Intravenous Q12H  . polyethylene glycol  17 g Oral Daily  . potassium  chloride  10 mEq Intravenous Once  . sodium chloride flush  10-40 mL Intracatheter Q12H   . sodium chloride 75 mL/hr at 02/28/15 0258    Assessment/Plan Symptomatic splenomegaly, S/p Splenectomy - 02/28/2015 - D. Newman Myelodysplastic syndrome, now with acute myeloid leukemia Thrombocytopenia Anemia Hx of CAD/MI 2010/AAA endovascular stent for AAA Prior VATS/COPD IVF for DVT/no heparin for Thrombocytopenia/SCD Antibiotics:  None   Plan:  I will try him on some liquid medicines  and see how he does with this.  He has to take Benadryl with any pain medicine  (oxycodone, morphine, dilaudid) except  for the Fentanyl.  He looks better today.   i  told him to stick to fluids till he felt better and was doing better with the swallowing.    LOS: 12 days    [name] 02/28/2015

## 2015-02-28 NOTE — Care Management Important Message (Signed)
Important Message  Patient Details  Name: [name] MRN: [id number] Date of Birth: 1945/09/05   Medicare Important Message Given:  Yes    Camillo Flaming 02/28/2015, 12:39 [place] Message  Patient Details  Name: [name] MRN: [id number] Date of Birth: 1945-01-28   Medicare Important Message Given:  Yes    Camillo Flaming 02/28/2015, 12:39 PM

## 2015-02-28 NOTE — Progress Notes (Signed)
[facility] Progress Note   MICHELE KERLIN  ZOX:[id number]  DOB: 1945/10/23  DOA: 03/07/2015 PCP: Shirline Frees, MD  Brief narrative: [name] is a 70 y.o. male with myelodysplastic syndrome and resultant pancytopenia who presents to the hospital for left flank and left upper abdominal pain. He is found to have significant splenomegaly and is noted to have transition of myelodysplastic syndrome to AML. It was recommended that I oncology he have a splenectomy. He has been receiving multiple transfusions for his pancytopenia.   Subjective: Still very nauseated today. No cough A bdominal pain controlled with medications  Assessment/Plan: Principal Problem:   AML (acute myeloid leukemia) with splenomegaly, pancytopenia and acquired coagulopathy with elevated INR - MDS has converted to AML per CT guided bone marrow biopsy that was performed on 02/17/15 - Oncology plans on palliative chemotherapy - s/p splenectomy on 2/8 - Continue supportive transfusions as needed- has received numerous red blood cell, FFP and platelet transfusions - Received vitamin K for elevated INR on 2/72 - received pneumococcal/meningococcal/Haemophilus vaccines  - Dr Marin Olp has ordered another unit of platelets on 2/13    Active Problems:  Dyspnea - CXR on 2/12 suggests mild fluid overload- not hypoxic- no crackles on exam - one time lasix given by surgical team on 2/12 - no further dyspnea-   resumed IVF - monitor I and O closely - 2 D ECHO shows normal EF and no diastolic dysfunction  S/p splenectomy - care per surgery team- still unable to take orals - pain management per surgery as well- on Fentanyl patch- ? If Fentanyl causing ongoing nausea??  Hypokalemia - due to lack of PO intake- will replace via IV again today and recheck tomorrow  Thrush -nearly resolved- Diflucan given for 5 days - can continue Nystatin for a few more days  Coronary atherosclerosis of native coronary  artery  Hyperlipidemia -On simvastatin  History of DVT -Currently has an IVC filter as we are unable to anticoagulate in setting of thrombocytopenia and coagulopathy    Antibiotics: Anti-infectives    Start     Dose/Rate Route Frequency Ordered Stop   02/21/2015 2200  fluconazole (DIFLUCAN) IVPB 200 mg    Comments:  May convert to PO if patient taking SOLID food well   200 mg 100 mL/hr over 60 Minutes Intravenous Every 24 hours 02/17/2015 2154 02/27/15 2159   02/19/15 1000  fluconazole (DIFLUCAN) tablet 200 mg  Status:  Discontinued     200 mg Oral Daily 02/19/15 0850 02/19/2015 2154     Code Status:     Code Status Orders        Start     Ordered   03/03/2015 1826  Full code   Continuous     03/14/2015 1825    Code Status History    Date Active Date Inactive Code Status Order ID Comments User Context   11/02/2014  4:34 AM 11/08/2014  6:12 PM Full Code [id number]  Ivor Costa, MD ED   10/11/2014  1:20 PM 10/17/2014  5:19 PM Full Code [id number]  Freddrick March, PA-C Inpatient   10/09/2014  9:23 PM 10/11/2014  1:20 PM Full Code [id number]  Ivin Poot, MD ED   09/18/2014  1:22 PM 09/20/2014  7:56 PM Full Code [id number]  Jacqulynn Cadet, MD Inpatient   09/14/2014  5:27 PM 09/18/2014  1:22 PM Full Code [id number]  Kelvin Cellar, MD Inpatient   04/01/2014 10:56 AM 04/01/2014  9:29 PM Full Code [id number]  Jacqulynn Cadet,  MD Inpatient   03/30/2014  8:52 PM 04/01/2014 10:56 AM Full Code [id number]  Deneise Lever, MD ED   08/04/2013  9:37 AM 08/06/2013  7:19 PM Full Code [id number]  Nita Sells, MD Inpatient   07/02/2013  2:19 PM 07/03/2013  7:04 PM Full Code [id number]  Ulyses Amor, PA-C Inpatient   06/13/2013  7:57 PM 06/21/2013  8:52 PM Full Code [id number]  Otho Bellows, MD Inpatient   06/05/2013  6:39 PM 06/12/2013  5:29 PM Full Code [id number]  Clinton Gallant, MD Inpatient    Advance Directive Documentation        Most Recent Value   Type of Advance Directive  Living will, Healthcare Power of  Attorney   Pre-existing out of facility DNR order (yellow form or pink MOST form)     "MOST" Form in Place?       Family Communication: wife  Disposition Plan: - uncertain when will be discharged DVT prophylaxis: SCDs due to severe thrombocytopenia and elevated INR Consultants: oncology, gen surgery Procedures: Bone marrow biopsy, splenectomy    Objective: Filed Weights   03/13/2015 1845 03/11/2015 1650 02/23/15 0400  Weight: 74 kg (163 lb 2.3 oz) 79.1 kg (174 lb 6.1 oz) 79.9 kg (176 lb 2.4 oz)    Intake/Output Summary (Last 24 hours) at 02/28/15 1740 Last data filed at 02/28/15 1549  Gross per 24 hour  Intake 1713.25 ml  Output    925 ml  Net 788.25 ml     Vitals Filed Vitals:   02/28/15 0602 02/28/15 0858 02/28/15 1336 02/28/15 1613  BP: 124/60 129/62 126/63 148/70  Pulse: 95 102 91   Temp: 99.9 F (37.7 C) 97.9 F (36.6 C) 98.4 F (36.9 C) 99 F (37.2 C)  TempSrc: Oral Oral Oral Axillary  Resp: '18 16 16 22  '$ Height:      Weight:      SpO2: 93% 94% 92% 93%    Exam:  General:  Pt is alert, not in acute distress  HEENT: No icterus, No thrush, oral mucosa moist  Cardiovascular: regular rate and rhythm, S1/S2 No murmur- 2/6 aortic murmur  Respiratory: clear to auscultation bilaterally   Abdomen: Soft, +Bowel sounds-  non distended, no guarding  MSK: No cyanosis or clubbing- no pedal edema   Data Reviewed: Basic Metabolic Panel:  Recent Labs Lab 02/25/15 0410 02/25/15 0417 02/26/15 0620 02/27/15 0610 02/27/15 1809 02/28/15 0545  NA 143  --  138 140 139 143  K 3.2*  --  3.1* 2.7* 5.9* 3.0*  CL 110  --  107 105 107 107  CO2 21*  --  19* '24 24 23  '$ GLUCOSE 89  --  99 89 119* 92  BUN 10  --  '10 12 11 13  '$ CREATININE 0.99  --  0.91 0.96 0.90 1.04  CALCIUM 7.7*  --  7.3* 7.5* 7.7* 7.9*  MG  --  1.5* 1.8  --   --  1.6*   Liver Function Tests: No results for input(s): AST, ALT, ALKPHOS, BILITOT, PROT, ALBUMIN in the last 168 hours. No results for  input(s): LIPASE, AMYLASE in the last 168 hours. No results for input(s): AMMONIA in the last 168 hours. CBC:  Recent Labs Lab 02/24/15 0410 02/25/15 0410 02/26/15 0620 02/27/15 0610 02/28/15 0545  WBC 24.1* 24.4* 27.5* 37.0* 74.4*  NEUTROABS 11.3* 10.0* 13.2* 4.4 11.2*  HGB 10.0* 9.2* 9.4* 9.3* 8.7*  HCT 29.9* 28.2* 27.5* 27.4* 26.2*  MCV 87.2 88.1 85.1 86.2  86.5  PLT 44* 17* 19* 6* 23*   Cardiac Enzymes: No results for input(s): CKTOTAL, CKMB, CKMBINDEX, TROPONINI in the last 168 hours. BNP (last 3 results) No results for input(s): BNP in the last 8760 hours.  ProBNP (last 3 results) No results for input(s): PROBNP in the last 8760 hours.  CBG: No results for input(s): GLUCAP in the last 168 hours.  Recent Results (from the past 240 hour(s))  Surgical pcr screen     Status: None   Collection Time: 02/21/15  8:58 PM  Result Value Ref Range Status   MRSA, PCR NEGATIVE NEGATIVE Final   Staphylococcus aureus NEGATIVE NEGATIVE Final    Comment:        The Xpert SA Assay (FDA approved for NASAL specimens in patients over 29 years of age), is one component of a comprehensive surveillance program.  Test performance has been validated by [facility] for patients greater than or equal to 66 year old. It is not intended to diagnose infection nor to guide or monitor treatment.   Culture, Urine     Status: None (Preliminary result)   Collection Time: 02/27/15 12:15 PM  Result Value Ref Range Status   Specimen Description URINE, CLEAN CATCH  Final   Special Requests NONE  Final   Culture   Final    10,000 COLONIES/mL GRAM POSITIVE COCCI Performed at [facility]    Report Status PENDING  Incomplete     Studies: No results found.  Scheduled Meds:  Scheduled Meds: . acetaminophen (TYLENOL) oral liquid 160 mg/5 mL  1,000 mg Oral Q6H  . belladonna-PHENObarbital  5 mL Oral TID  . feeding supplement (ENSURE ENLIVE)  237 mL Oral BID BM  . feeding supplement  (ENSURE ENLIVE)  237 mL Oral BID BM  . fentaNYL  50 mcg Transdermal Q72H  . fluticasone  2 spray Each Nare Daily  . lip balm  1 application Topical BID  . loratadine  10 mg Oral Daily  . magic mouthwash  15 mL Oral QID  . methocarbamol  1,000 mg Oral TID  . nystatin  5 mL Oral QID  . ondansetron (ZOFRAN) IV  8 mg Intravenous 3 times per day  . pantoprazole (PROTONIX) IV  40 mg Intravenous Q12H  . polyethylene glycol  17 g Oral Daily  . potassium chloride  10 mEq Intravenous Once  . sodium chloride flush  10-40 mL Intracatheter Q12H   Continuous Infusions: . sodium chloride 125 mL/hr at 02/28/15 1708    Time spent on care of this patient: 35 min   [place], MD 02/28/2015, 5:40 PM  LOS: 12 days   [facility] Office  [phone] Pager - Text Page per [email or web address] If 7PM-7AM, please contact night-coverage [email or web address]

## 2015-02-28 NOTE — Progress Notes (Signed)
[name] is improving slowly. He really has, around nicely from his splenectomy. His white cell count is going up. This might be just from the fact that his spleen is no longer there to act as a "reservoir" for his blood.  He really is not complaining too much the way of pain. As such, getting the spleen out was very helpful and useful for his quality of life.  His hemoglobin is 8.7. His platelet count is 23. He did get 1 unit of platelets yesterday.  His potassium was quite high yesterday. This is being repeated.  He seems to be eating a little bit better.  I did talk to about treatment for the leukemia. This is going be quite tricky. He really does not want anything aggressive.  His vital signs are all stable. His blood pressure is 124/60. His temperature is 99.9. Pulse is 95. His lungs are clear. Cardiac exam regular rate and rhythm with no murmurs, rubs or bruits. Abdomen is slightly distended. Bowel sounds are present. He has the healing laparoscopy scars. There is no hepatomegaly. Extremities shows no clubbing, cyanosis or edema. Skin exam shows no rashes.  [name] is recovering from his splenectomy. Hopefully, he will be able to go home soon.  Again, we will have to be careful with his blood counts. I'm sure he'll have to be transfused in the future.  I'll see about trying him on a pill for the leukemia. This might be very difficult given insurance.  As always, I appreciate the wonderful care that he is getting from the staphyloma for [name] 15:13

## 2015-03-01 ENCOUNTER — Inpatient Hospital Stay (HOSPITAL_COMMUNITY): Payer: Medicare Other

## 2015-03-01 ENCOUNTER — Encounter: Payer: Self-pay | Admitting: *Deleted

## 2015-03-01 ENCOUNTER — Encounter: Payer: Self-pay | Admitting: Hematology & Oncology

## 2015-03-01 DIAGNOSIS — A419 Sepsis, unspecified organism: Secondary | ICD-10-CM | POA: Diagnosis not present

## 2015-03-01 DIAGNOSIS — R6520 Severe sepsis without septic shock: Secondary | ICD-10-CM | POA: Diagnosis not present

## 2015-03-01 DIAGNOSIS — R110 Nausea: Secondary | ICD-10-CM

## 2015-03-01 DIAGNOSIS — R0989 Other specified symptoms and signs involving the circulatory and respiratory systems: Secondary | ICD-10-CM | POA: Diagnosis not present

## 2015-03-01 DIAGNOSIS — R109 Unspecified abdominal pain: Secondary | ICD-10-CM

## 2015-03-01 DIAGNOSIS — C9200 Acute myeloblastic leukemia, not having achieved remission: Secondary | ICD-10-CM

## 2015-03-01 DIAGNOSIS — R410 Disorientation, unspecified: Secondary | ICD-10-CM

## 2015-03-01 DIAGNOSIS — N179 Acute kidney failure, unspecified: Secondary | ICD-10-CM | POA: Diagnosis not present

## 2015-03-01 LAB — BLOOD GAS, ARTERIAL
ACID-BASE DEFICIT: 16.2 mmol/L — AB (ref 0.0–2.0)
Acid-base deficit: 21.8 mmol/L — ABNORMAL HIGH (ref 0.0–2.0)
BICARBONATE: 11 meq/L — AB (ref 20.0–24.0)
Bicarbonate: 7.2 mEq/L — ABNORMAL LOW (ref 20.0–24.0)
DRAWN BY: 441261
Delivery systems: POSITIVE
Drawn by: 441261
Expiratory PAP: 5
FIO2: 1
FIO2: 0.6
INSPIRATORY PAP: 10
LHR: 8 {breaths}/min
O2 SAT: 96.4 %
O2 Saturation: 94.3 %
PATIENT TEMPERATURE: 37
PO2 ART: 118 mmHg — AB (ref 80.0–100.0)
PO2 ART: 120 mmHg — AB (ref 80.0–100.0)
Patient temperature: 101.4
TCO2: 11.2 mmol/L (ref 0–100)
TCO2: 7.7 mmol/L (ref 0–100)
pCO2 arterial: 32.7 mmHg — ABNORMAL LOW (ref 35.0–45.0)
pCO2 arterial: 31.7 mmHg — ABNORMAL LOW (ref 35.0–45.0)
pH, Arterial: 7.154 — CL (ref 7.350–7.450)
pH, Arterial: 7.002 — CL (ref 7.350–7.450)

## 2015-03-01 LAB — CBC WITH DIFFERENTIAL/PLATELET
BAND NEUTROPHILS: UNDETERMINED %
BASOS ABS: 0 10*3/uL (ref 0.0–0.1)
BASOS ABS: UNDETERMINED 10*3/uL (ref 0.0–0.1)
BLASTS: UNDETERMINED %
Band Neutrophils: 0 %
Basophils Relative: 0 %
Basophils Relative: UNDETERMINED %
Blasts: 15 %
EOS ABS: UNDETERMINED 10*3/uL (ref 0.0–0.7)
EOS PCT: 0 %
Eosinophils Absolute: 0 10*3/uL (ref 0.0–0.7)
Eosinophils Relative: UNDETERMINED %
HCT: UNDETERMINED % (ref 39.0–52.0)
HEMATOCRIT: 24.2 % — AB (ref 39.0–52.0)
HEMOGLOBIN: 8 g/dL — AB (ref 13.0–17.0)
Hemoglobin: UNDETERMINED g/dL (ref 13.0–17.0)
LYMPHS ABS: 3.7 10*3/uL (ref 0.7–4.0)
LYMPHS PCT: 3 %
Lymphocytes Relative: UNDETERMINED %
Lymphs Abs: UNDETERMINED 10*3/uL (ref 0.7–4.0)
MCH: 28.5 pg (ref 26.0–34.0)
MCH: UNDETERMINED pg (ref 26.0–34.0)
MCHC: 33.1 g/dL (ref 30.0–36.0)
MCHC: UNDETERMINED g/dL (ref 30.0–36.0)
MCV: 86.1 fL (ref 78.0–100.0)
MCV: UNDETERMINED fL (ref 78.0–100.0)
METAMYELOCYTES PCT: 0 %
METAMYELOCYTES PCT: UNDETERMINED %
MYELOCYTES: UNDETERMINED %
Monocytes Absolute: 88.6 10*3/uL — ABNORMAL HIGH (ref 0.1–1.0)
Monocytes Absolute: UNDETERMINED 10*3/uL (ref 0.1–1.0)
Monocytes Relative: 72 %
Monocytes Relative: UNDETERMINED %
Myelocytes: 0 %
NEUTROS ABS: 12.3 10*3/uL — AB (ref 1.7–7.7)
Neutro Abs: UNDETERMINED 10*3/uL (ref 1.7–7.7)
Neutrophils Relative %: 10 %
Neutrophils Relative %: UNDETERMINED %
OTHER: 0 %
Other: UNDETERMINED %
PLATELETS: UNDETERMINED 10*3/uL (ref 150–400)
PROMYELOCYTES ABS: UNDETERMINED %
Platelets: 50 10*3/uL — ABNORMAL LOW (ref 150–400)
Promyelocytes Absolute: 0 %
RBC MORPHOLOGY: UNDETERMINED
RBC: 2.81 MIL/uL — AB (ref 4.22–5.81)
RBC: UNDETERMINED MIL/uL (ref 4.22–5.81)
RDW: 18.3 % — AB (ref 11.5–15.5)
RDW: UNDETERMINED % (ref 11.5–15.5)
SMEAR REVIEW: UNDETERMINED
WBC MORPHOLOGY: UNDETERMINED
WBC: 123.1 10*3/uL — AB (ref 4.0–10.5)
WBC: UNDETERMINED 10*3/uL (ref 4.0–10.5)
nRBC: 0 /100 WBC
nRBC: UNDETERMINED /100 WBC

## 2015-03-01 LAB — URINALYSIS, ROUTINE W REFLEX MICROSCOPIC
Glucose, UA: NEGATIVE mg/dL
KETONES UR: NEGATIVE mg/dL
LEUKOCYTES UA: NEGATIVE
Nitrite: NEGATIVE
PH: 5.5 (ref 5.0–8.0)
PROTEIN: 100 mg/dL — AB
Specific Gravity, Urine: 1.022 (ref 1.005–1.030)

## 2015-03-01 LAB — BASIC METABOLIC PANEL
Anion gap: 13 (ref 5–15)
BUN: 22 mg/dL — AB (ref 6–20)
CALCIUM: 7.8 mg/dL — AB (ref 8.9–10.3)
CHLORIDE: 108 mmol/L (ref 101–111)
CO2: 20 mmol/L — AB (ref 22–32)
CREATININE: 1.32 mg/dL — AB (ref 0.61–1.24)
GFR calc non Af Amer: 54 mL/min — ABNORMAL LOW (ref >60)
GLUCOSE: 116 mg/dL — AB (ref 65–99)
Potassium: 3.4 mmol/L — ABNORMAL LOW (ref 3.5–5.1)
Sodium: 141 mmol/L (ref 135–145)

## 2015-03-01 LAB — URIC ACID: URIC ACID, SERUM: 7.9 mg/dL — AB (ref 4.4–7.6)

## 2015-03-01 LAB — LACTATE DEHYDROGENASE: LDH: 1170 U/L — AB (ref 98–192)

## 2015-03-01 LAB — MAGNESIUM: MAGNESIUM: 2.1 mg/dL (ref 1.7–2.4)

## 2015-03-01 LAB — URINE MICROSCOPIC-ADD ON

## 2015-03-01 LAB — URINE CULTURE

## 2015-03-01 LAB — PROTIME-INR: PROTHROMBIN TIME: UNDETERMINED s (ref 11.6–15.2)

## 2015-03-01 LAB — APTT: aPTT: UNDETERMINED seconds (ref 24–37)

## 2015-03-01 LAB — LACTIC ACID, PLASMA: LACTIC ACID, VENOUS: UNDETERMINED mmol/L (ref 0.5–2.0)

## 2015-03-01 LAB — PROCALCITONIN: PROCALCITONIN: 3.69 ng/mL

## 2015-03-01 MED ORDER — PIPERACILLIN-TAZOBACTAM 3.375 G IVPB
3.3750 g | Freq: Three times a day (TID) | INTRAVENOUS | Status: DC
  Filled 2015-03-01: qty 50

## 2015-03-01 MED ORDER — HYDROXYZINE HCL 50 MG/ML IM SOLN
25.0000 mg | Freq: Three times a day (TID) | INTRAMUSCULAR | Status: DC | PRN
  Filled 2015-03-01: qty 0.5

## 2015-03-01 MED ORDER — FENTANYL BOLUS VIA INFUSION
50.0000 ug | INTRAVENOUS | Status: DC | PRN
  Filled 2015-03-01: qty 200

## 2015-03-01 MED ORDER — METHYLPREDNISOLONE SODIUM SUCC 40 MG IJ SOLR: 40.0000 mg | Freq: Four times a day (QID) | INTRAMUSCULAR | Status: DC

## 2015-03-01 MED ORDER — SODIUM BICARBONATE 4.2 % IV SOLN: 50.0000 meq | Freq: Once | INTRAVENOUS | Status: AC

## 2015-03-01 MED ORDER — SODIUM CHLORIDE 0.9 % IV SOLN
100.0000 ug/h | INTRAVENOUS | Status: DC
  Administered 2015-03-01: 100 ug/h via INTRAVENOUS
  Filled 2015-03-01: qty 50

## 2015-03-01 MED ORDER — BUDESONIDE 0.5 MG/2ML IN SUSP: 0.5000 mg | Freq: Two times a day (BID) | RESPIRATORY_TRACT | Status: DC

## 2015-03-01 MED ORDER — PIPERACILLIN-TAZOBACTAM 3.375 G IVPB 30 MIN
3.3750 g | Freq: Once | INTRAVENOUS | Status: AC
  Administered 2015-03-01: 3.375 g via INTRAVENOUS
  Filled 2015-03-01: qty 50

## 2015-03-01 MED ORDER — HYDROXYUREA 500 MG PO CAPS
1000.0000 mg | ORAL_CAPSULE | Freq: Two times a day (BID) | ORAL | Status: DC
  Filled 2015-03-01 (×2): qty 2

## 2015-03-01 MED ORDER — LORAZEPAM 2 MG/ML IJ SOLN
INTRAMUSCULAR | Status: AC
  Administered 2015-03-01: 08:00:00
  Filled 2015-03-01: qty 1

## 2015-03-01 MED ORDER — METRONIDAZOLE IN NACL 5-0.79 MG/ML-% IV SOLN: 500.0000 mg | Freq: Three times a day (TID) | INTRAVENOUS | Status: DC

## 2015-03-01 MED ORDER — ACETAMINOPHEN 650 MG RE SUPP
325.0000 mg | Freq: Once | RECTAL | Status: AC
  Administered 2015-03-01: 325 mg via RECTAL
  Filled 2015-03-01: qty 1

## 2015-03-01 MED ORDER — FLUCONAZOLE IN SODIUM CHLORIDE 100-0.9 MG/50ML-% IV SOLN
100.0000 mg | INTRAVENOUS | Status: DC
  Administered 2015-03-01: 100 mg via INTRAVENOUS
  Filled 2015-03-01: qty 50

## 2015-03-01 MED ORDER — VANCOMYCIN HCL 10 G IV SOLR
1500.0000 mg | Freq: Once | INTRAVENOUS | Status: AC
  Administered 2015-03-01: 1500 mg via INTRAVENOUS
  Filled 2015-03-01 (×2): qty 1500

## 2015-03-01 MED ORDER — METOPROLOL TARTRATE 1 MG/ML IV SOLN: 5.0000 mg | Freq: Once | INTRAVENOUS | Status: DC

## 2015-03-01 MED ORDER — SODIUM BICARBONATE 8.4 % IV SOLN
INTRAVENOUS | Status: DC
  Administered 2015-03-01: 17:00:00 via INTRAVENOUS
  Filled 2015-03-01: qty 100

## 2015-03-01 MED ORDER — SODIUM BICARBONATE 8.4 % IV SOLN
INTRAVENOUS | Status: AC
  Administered 2015-03-01: 50 meq
  Filled 2015-03-01: qty 50

## 2015-03-01 MED ORDER — FENTANYL CITRATE (PF) 100 MCG/2ML IJ SOLN
INTRAMUSCULAR | Status: AC
  Filled 2015-03-01: qty 2

## 2015-03-01 MED ORDER — SODIUM BICARBONATE 8.4 % IV SOLN
50.0000 meq | Freq: Once | INTRAVENOUS | Status: AC
  Administered 2015-03-01: 50 meq via INTRAVENOUS
  Filled 2015-03-01: qty 50

## 2015-03-01 MED ORDER — LEVOFLOXACIN IN D5W 750 MG/150ML IV SOLN
750.0000 mg | Freq: Once | INTRAVENOUS | Status: AC
  Administered 2015-03-01: 750 mg via INTRAVENOUS
  Filled 2015-03-01: qty 150

## 2015-03-01 MED ORDER — ACETAMINOPHEN 650 MG RE SUPP
650.0000 mg | Freq: Four times a day (QID) | RECTAL | Status: DC | PRN
  Administered 2015-03-01: 650 mg via RECTAL
  Filled 2015-03-01: qty 1

## 2015-03-01 MED ORDER — LORAZEPAM 2 MG/ML IJ SOLN: 1.0000 mg | Freq: Four times a day (QID) | INTRAMUSCULAR | Status: DC | PRN

## 2015-03-01 MED ORDER — VANCOMYCIN HCL IN DEXTROSE 750-5 MG/150ML-% IV SOLN
750.0000 mg | Freq: Two times a day (BID) | INTRAVENOUS | Status: DC
  Filled 2015-03-01: qty 150

## 2015-03-01 MED ORDER — MIDAZOLAM HCL 2 MG/2ML IJ SOLN
INTRAMUSCULAR | Status: AC
  Filled 2015-03-01: qty 2

## 2015-03-01 MED ORDER — METHYLPREDNISOLONE SODIUM SUCC 40 MG IJ SOLR
40.0000 mg | Freq: Once | INTRAMUSCULAR | Status: AC
  Administered 2015-03-01: 40 mg via INTRAVENOUS
  Filled 2015-03-01: qty 1

## 2015-03-03 LAB — URINE CULTURE: CULTURE: NO GROWTH

## 2015-03-06 ENCOUNTER — Other Ambulatory Visit: Payer: Medicare Other

## 2015-03-06 ENCOUNTER — Encounter (HOSPITAL_COMMUNITY): Payer: Self-pay

## 2015-03-06 LAB — CULTURE, BLOOD (ROUTINE X 2)
CULTURE: NO GROWTH
Culture: NO GROWTH

## 2015-03-07 ENCOUNTER — Encounter: Payer: Self-pay | Admitting: Hematology & Oncology

## 2015-03-13 ENCOUNTER — Other Ambulatory Visit: Payer: Medicare Other

## 2015-03-15 NOTE — Progress Notes (Signed)
At 2105 Pt asystole on monitor. No noted heart tones or breath sounds on auscultation. Verified with second RN Pamala Hurry May.

## 2015-03-15 NOTE — Progress Notes (Signed)
[name] is having a tough sounds morning. He's having quite a bit of  Nausea. He had some vomiting. I will try him on some Ativan to see if this helps. He has a lot of allergies.  His white cell count is now 123,000. I worry that his leukemia is becoming more of an issue.his platelets count is 50,000. Hemoglobin is 8.  I just wonder if some of the nausea may not be from leukemic infiltration into his intestines. This is not uncommon.  hhe does not have as much abdominal pain. Again, getting his spleen taken out really has helped this issue.The pain that he is having seems to be postoperative.  I think i am going to have to get him on some Hydrea.this will hopefully will help bring his white cell count down a little bit.  I just feel bad for him that he has this nausea. He has so many allergies that is hard to use other anti-emetics.  He's not had any obvious bleeding.  He really is not getting out of bed.  On his physical exam today, his blood pressure is 99/57. Temperature 97.6. Pulse is 89. His lungs sound clear. Cardiac exam is regular rate and rhythm. His abdomen is slightly distended. He does have some decreased bowel sounds. He has the healing laparoscopy wounds. There is no hepatomegaly. Extremities shows no clubbing, cyanosis or edema.  [name] just is not doing well. His white cell count is becoming much more of a problem. Hopefully, Hydrea will help with this.  I'll try some Ativan to see this elsewhere his nausea.  I know he is trying his best.  I appreciated the great care that he is getting for everybody up on 4 E.  [name] E.  Romans 5:3-5

## 2015-03-15 NOTE — Progress Notes (Signed)
7 Days Post-Op  Subjective: His big issue is nausea, he is swallowing better.  His pain is better controlled, and she seems to be doing OK with liquid meds.  The nausea comes with the food, so he is not able to eat and this is his big issue now.  Incision and port sites are unchanged and are fine from our standpoint.  Objective: Vital signs in last 24 hours: Temp:  [97.6 F (36.4 C)-100.4 F (38 C)] 97.6 F (36.4 C) (02/15 0537) Pulse Rate:  [89-124] 89 (02/15 0537) Resp:  [16-22] 18 (02/15 0537) BP: (99-148)/(57-74) 99/57 mmHg (02/15 0537) SpO2:  [92 %-95 %] 95 % (02/15 0537) Weight:  [78.835 kg (173 lb 12.8 oz)] 78.835 kg (173 lb 12.8 oz) (02/15 0537) Last BM Date: 02/28/15 PO 2 recorded Urine 300 recorded BM x 1 recorded Afebrile, BP dow some at 0300, not recheck/recorded since that time He got orthostatic pressures yesterday and he is not orthostatic, BP down at 2300 yesterday also sats OK with [place] LabsK+ 3.4, mag 2.1, WBC up to 123K, platelets are 50 K (88.6% monocytes) Intake/Output from previous day: 02/14 0701 - 02/15 0700 In: 3107.3 [P.O.:2; I.V.:2493.3; IV Piggyback:612] Out: 300 [Urine:300] Intake/Output this shift:    General appearance: alert, cooperative, no distress and he is kind of sleepy, he just got Ativan. GI: soft, + BS, and BM.  sites all look good.    Lab Results:   Recent Labs  02/28/15 0545 03/17/15 0550  WBC 74.4* 123.1*  HGB 8.7* 8.0*  HCT 26.2* 24.2*  PLT 23* 50*    BMET  Recent Labs  02/28/15 0545 03-17-15 0550  NA 143 141  K 3.0* 3.4*  CL 107 108  CO2 23 20*  GLUCOSE 92 116*  BUN 13 22*  CREATININE 1.04 1.32*  CALCIUM 7.9* 7.8*   PT/INR No results for input(s): LABPROT, INR in the last 72 hours.  No results for input(s): AST, ALT, ALKPHOS, BILITOT, PROT, ALBUMIN in the last 168 hours.   Lipase     Component Value Date/Time   LIPASE 30 11/20/2014 1330     Studies/Results: No results found.  Medications: .  acetaminophen (TYLENOL) oral liquid 160 mg/5 mL  1,000 mg Oral Q6H  . feeding supplement (ENSURE ENLIVE)  237 mL Oral BID BM  . feeding supplement (ENSURE ENLIVE)  237 mL Oral BID BM  . fentaNYL  50 mcg Transdermal Q72H  . fluticasone  2 spray Each Nare Daily  . hydroxyurea  1,000 mg Oral BID  . lip balm  1 application Topical BID  . loratadine  10 mg Oral Daily  . LORazepam      . magic mouthwash  15 mL Oral QID  . methocarbamol  1,000 mg Oral TID  . nystatin  5 mL Oral QID  . ondansetron (ZOFRAN) IV  8 mg Intravenous 3 times per day  . pantoprazole (PROTONIX) IV  40 mg Intravenous Q12H  . polyethylene glycol  17 g Oral Daily  . sodium chloride flush  10-40 mL Intracatheter Q12H    Assessment/Plan Symptomatic splenomegaly, S/p Splenectomy - 03/11/2015 - D. Newman Myelodysplastic syndrome, now with acute myeloid leukemia Thrombocytopenia Anemia Hx of CAD/MI 2010/AAA endovascular stent for AAA Prior VATS/COPD IVF for DVT/no heparin for Thrombocytopenia/SCD Antibiotics: None     Plan:  From a surgical standpoint, he is OK,  Big issue is rising WBC.  Dr. Marin Olp is concerned that this nausea is from the Leukemia and infiltration into the  bowel.  He is starting Hydrea to treat this and added Ativan for the nausea.  We will continue to follow.  LOS: 13 days    Gevon Markus 03-12-2015

## 2015-03-15 NOTE — Progress Notes (Signed)
Initial Nutrition Assessment  DOCUMENTATION CODES:   Not applicable  INTERVENTION:  - Continue Ensure Enlive po BID, each supplement provides 350 kcal and 20 grams of protein - RD will continue to monitor POC and associated needs  NUTRITION DIAGNOSIS:   Inadequate oral intake related to lethargy/confusion as evidenced by per patient/family report.  GOAL:   Patient will meet greater than or equal to 90% of their needs  MONITOR:   PO intake, Weight trends, Labs, Skin, I & O's  REASON FOR ASSESSMENT:   LOS  ASSESSMENT:   His big issue is nausea, he is swallowing better. His pain is better controlled, and she seems to be doing OK with liquid meds. The nausea comes with the food, so he is not able to eat and this is his big issue now. Incision and port sites are unchanged and are fine from our standpoint. POD #7 laparotomy  Pt seen for LOS. BMI indicates overweight status. Pt ate 0% of all meals 2/10 and 2/11 with no other intakes recorded since that time. Wife is at bedside as pt is sleeping and she reports that pt has been unable to eat x1.5 weeks due to "being out of it from overmedication." Wife is highly concerned about this and requests discussion with MD and would prefer to talk with RD at another time so that her concerns can be addressed at this time.   Pt not meeting needs. Unable to obtain information at this time. Unable to do physical assessment at this time. Chart review indicates 7 lb weight gain in the past 1 month; will continue to monitor weight trends. Medications reviewed. Labs reviewed; K: 3.4 mmol/L, BUN/creatinine elevated and trending up, Ca: 7.8 mg/dL.   Diet Order:  DIET - DYS 1 Room service appropriate?: Yes; Fluid consistency:: Thin  Skin:  Wound (see comment) (Stage 1 sacral pressure ulcer, Abdominal incision from 02/21/2015)  Last BM:  2/14  Height:   Ht Readings from Last 1 Encounters:  03/13/2015 5\' 10"  (1.778 m)    Weight:   Wt Readings from  Last 1 Encounters:  03-05-2015 173 lb 12.8 oz (78.835 kg)    Ideal Body Weight:  75.45 kg (kg)  BMI:  Body mass index is 24.94 kg/(m^2).  Estimated Nutritional Needs:   Kcal:  [id number] (30-32 kcal/kg)  Protein:  95-110 grams (1.2-1.4 grams/kg)  Fluid:  >/= 2.2 L/day  EDUCATION NEEDS:   No education needs identified at this time     Jarome Matin, RD, LDN Inpatient Clinical Dietitian Pager # [phone] After hours/weekend pager # [phone]

## 2015-03-15 NOTE — Progress Notes (Signed)
Chaplain paged to attend to the family as they were at the bedside. Mr Buchko died peacefully. The Chaplain and a friend who is also a Company secretary were present to comfort the family.   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 He is mourned by his wife, children and friends.  The family wishes to thank the staff for their exceptional, loving and supportive care.  Sallee Lange. Aranza Geddes, [place]

## 2015-03-15 NOTE — Consult Note (Signed)
Admission H&P    Chief Complaint: Altered mental status with abnormal movements.  HPI: [name] is an 70 y.o. male with a history of myelodysplasia, AAA, myocardial infarction, and tobacco abuse, currently being treated for AML with splenomegaly, who developed acute mental status changes as well as fever and abnormal involuntary movements of extremities earlier today. Temperature is been as high as 103.1. Patient has been markedly confused and somewhat agitated. Involuntary twitching of extremities have been noted and seen to be somewhat random. Patient has also been tachypneic and tachycardic. Lactic acid was elevated at 10.1. Blood cultures were obtained. He was started on Zosyn and vancomycin IV for antibiotic coverage.  Past Medical History  Diagnosis Date  . Coronary atherosclerosis of native coronary artery     a. 10/2008 inf STEMI/PCI: LM 50d (IVUS-borderline lesion->med rx), LAD min irregs, LCX 77m 70d, OM nl, RCA [id number] Vision BMS);  b. Nuc 05/2013: Applying for a inferior wall attenuation artifact, there are no perfusion defects and there is no evidence of stress-induced ischemia.  . Essential hypertension   . Pure hypercholesterolemia   . AAA [place]dominal aortic aneurysm) ([place]    a. s/p EVAR in 2015 by Vascular.  . Diverticulitis     a. 05/2013 CT: descending/sigmoid jxn w/o abscess.  . Osteoarthritis   . Tobacco abuse     a. ongoing - 1ppd for better part of 50 yrs.  . Normocytic anemia   . Cellulitis 06/10/2013    Right antecubital fossa at site of IV  03/12/13  . PONV (postoperative nausea and vomiting)   . Diverticulitis   . GERD (gastroesophageal reflux disease)   . MDS (myelodysplastic syndrome), high grade ([facility]3/30/2016  . Myocardial infarction ([place]2010  . Depression   . Spontaneous pneumothorax     a. recurrent spontaneous PTX s/p VATS 09/2014.  . Marland Kitchenypotestosteronemia   . Esophageal ulcer 10/2014    a. Taken off ASA at that time.  . Marland KitchenVT (deep vein  thrombosis) in pregnancy     a. 09/2014: left lower extremity popliteal DV s/p IVC filter.  . Gastritis 10/2014  . Sialadenitis   . Protein calorie malnutrition (HCB[place]  . Bradycardia     a. Not on BB due to this + hypotension.  . Pneumothorax, left 10/09/2014    Past Surgical History  Procedure Laterality Date  . Cardiac stents  2010  . Cholecystectomy N/A 06/19/2013    Procedure: LAPAROSCOPIC CHOLECYSTECTOMY;  Surgeon: DoHarl BowieMD;  [facility];  Laterality: N/A;  . Abdominal aortic endovascular stent graft N/A 07/02/2013    Procedure: ABDOMINAL AORTIC ENDOVASCULAR STENT GRAFT;  Surgeon: VaSerafina MitchellMD;  [facility];  Service: Vascular;  Laterality: N/A;  . Esophagogastroduodenoscopy N/A 08/05/2013    Procedure: ESOPHAGOGASTRODUODENOSCOPY (EGD);  Surgeon: ViLear NgMD;  [facility];  Service: Endoscopy;  Laterality: N/A;  . Eye surgery Left     cataract surgery  . Back surgery      cervical fusion  . Colonoscopy with propofol N/A 10/15/2013    Procedure: COLONOSCOPY WITH PROPOFOL;  Surgeon: ViLear NgMD;  [place] [facility] Service: [place]oscopy;  Laterality: N/A;  . Fetal blood transfusion  March 16,17,18, 2016  . Bone marrow biopsy  April 01, 2014  . Chest tube insertion  09/14/2014  . Video assisted thoracoscopy Left 10/11/2014    Procedure: VIDEO ASSISTED THORACOSCOPY;  Surgeon: PeIvin PootMD;  [facility];  Service: Thoracic;  Laterality: Left;  . Stapling of blebs Left 10/11/2014    Procedure: STAPLING OF BLEBS;  Surgeon: Kerin Perna, MD;  Location[facility] OR;  Service: Thoracic;  Laterality: Left;  . Esophagogastroduodenoscopy N/A 11/04/2014    Procedure: ESOPHAGOGASTRODUODENOSCOPY (EGD);  Surgeon: Carman Ching, MD;  Location: [facility];  Service: Endoscopy;  Laterality: N/A;  . Laparoscopic splenectomy N/A 02/21/2015    Procedure: LAPAROSCOPIC ASSISTED SPLENECTOMY;  Surgeon: Ovidio Kin, MD;   Location: [facility] ORS;  Service: General;  Laterality: N/A;    Family History  Problem Relation Age of Onset  . Heart attack Brother     60s  . Cancer Father     Lung  . Cancer Mother     Brain  . Cancer Brother     Kidney   Social History:  reports that he has quit smoking. His smoking use included Cigarettes. He started smoking about 50 years ago. He has a 60 pack-year smoking history. He has never used smokeless tobacco. He reports that he does not drink alcohol or use illicit drugs.  Allergies:  Allergies  Allergen Reactions  . Cephalexin Other (See Comments)    Nausea and stomach cramps  . Reglan [Metoclopramide] Other (See Comments)    Patient has significant shakes that are troubling. Would not like to receive this if there are other options available.  . Albuterol-Ipratropium-Soybean Lecithin [Ipratropium-Albuterol] Anxiety  . Compazine [Prochlorperazine] Anxiety  . Hydrocodone Itching    Takes w benadryl  . Marinol [Dronabinol] Other (See Comments)    "Affects his eyes"  . Oxycodone Itching    Can take with benadryl   . Phenergan [Promethazine Hcl] Other (See Comments)    "restless legs"  . Tramadol Other (See Comments)    "jerking limbs and talking in his sleep"    Medications Prior to Admission  Medication Sig Dispense Refill  . acetaminophen (TYLENOL) 500 MG tablet Take 500 mg by mouth every 6 (six) hours as needed for fever.    Marlana Salvage alk-PHENObarbital (DONNATAL) 16.2 MG tablet Take 1 tablet by mouth every 8 (eight) hours as needed. 120 tablet 2  . diphenhydrAMINE (BENADRYL) 25 mg capsule Take 1 capsule (25 mg total) by mouth every 6 (six) hours as needed for itching. May take with Percocet PRN 30 capsule 0  . HYDROcodone-acetaminophen (NORCO/VICODIN) 5-325 MG tablet Take 1-2 tablets by mouth every 4 (four) hours as needed for moderate pain.    Marland Kitchen lidocaine-prilocaine (EMLA) cream Apply 1 application topically as needed. Please dispense 2 tubes 60 g 3  .  Multiple Vitamins-Minerals (PRESERVISION AREDS 2 PO) Take by mouth 2 (two) times daily.    . nitroGLYCERIN (NITROSTAT) 0.4 MG SL tablet Place 1 tablet (0.4 mg total) under the tongue every 5 (five) minutes as needed for chest pain. 25 tablet 6  . oxyCODONE-acetaminophen (PERCOCET) 7.5-325 MG tablet Take 1-2 tablets by mouth every 4 (four) hours as needed for moderate pain or severe pain. 240 tablet 0  . pantoprazole (PROTONIX) 40 MG tablet Take 1 tablet (40 mg total) by mouth 2 (two) times daily.    . predniSONE (DELTASONE) 20 MG tablet Take 20 mg by mouth as needed (chemo reaction and energy "boost.").     . simvastatin (ZOCOR) 20 MG tablet Take 1 tablet (20 mg total) by mouth at bedtime. 30 tablet 5  . sucralfate (CARAFATE) 1 GM/10ML suspension Take 10 mLs (1 g total) by mouth 4 (four) times daily -  with meals  and at bedtime. (Patient taking differently: Take 1 g by mouth 4 (four) times daily -  with meals and at bedtime. Takes as needed) 420 mL 0  . temazepam (RESTORIL) 15 MG capsule Take 15 mg by mouth at bedtime as needed for sleep.    . [DISCONTINUED] doxycycline (VIBRA-TABS) 100 MG tablet Take 1 tablet (100 mg total) by mouth 2 (two) times daily. For 7 days. 14 tablet 0  . mupirocin ointment (BACTROBAN) 2 % Apply 3 times a day to affected area under RIGHT ear. (Patient not taking: Reported on 02/15/2015) 22 g 0    ROS: Source: Chart review Constitutional: + for chills Negative for fever, malaise/fatigue. Negative for diaphoresis.  HENT: Negative for hearing loss, ear pain, nosebleeds Eyes: Negative for blurred vision, double vision, photophobia, pain Respiratory: Negative for cough, hemoptysis, sputum production, shortness of breath,  Cardiovascular: Negative for chest pain, palpitations Gastrointestinal: + for severe abdominal pain Negative for nausea, vomiting. Negative for heartburn, constipation, blood in stool and melena.  Genitourinary: Negative for dysuria, urgency, frequency,  hematuria Musculoskeletal: Negative for myalgias, back pain, joint pain and falls.  Skin: Negative for itching and rash.  Neurological: + for some lightheadedness Negative for dizziness and weakness.    Physical Examination: Blood pressure 92/48, pulse 129, temperature 103.1 F (39.5 C), temperature source Axillary, resp. rate 34, height '5\' 10"'$  (1.778 m), weight 78.835 kg (173 lb 12.8 oz), SpO2 88 %.  HEENT-  Normocephalic, no lesions, without obvious abnormality.  Normal external eye and conjunctiva.  Normal TM's bilaterally.  Normal auditory canals and external ears. Normal external nose, mucus membranes and septum.  Normal pharynx. Neck supple with no masses, nodes, nodules or enlargement. Cardiovascular - tachycardic in the 140s, normal S1 and S2, no S3 or S4 Lungs - tachypnea at 30/m, clear auscultation bilaterally Abdomen - soft, nontender Extremities - no joint deformities, effusion, or inflammation  Neurologic Examination: Patient was markedly confused and anxious. He had no clear verbal output and no purposeful movements. Pupils were equal and reacted normally to light. Extraocular movements were intact with right left lateral gaze. Face was symmetrical with no focal weakness. Muscle tone was flaccid throughout. Patient had frequent random choreic type movements of extremities distally, upper extremities greater than lower extremities. Strength appeared to be normal throughout. Deep tendon reflexes were 2+ and symmetrical. Plantar responses were mute bilaterally.  Results for orders placed or performed during the hospital encounter of 02/25/2015 (from the past 48 hour(s))  Basic metabolic panel     Status: Abnormal   Collection Time: 02/27/15  6:09 PM  Result Value Ref Range   Sodium 139 135 - 145 mmol/L    Comment: REPEATED TO VERIFY   Potassium 5.9 (H) 3.5 - 5.1 mmol/L    Comment: DELTA CHECK NOTED REPEATED TO VERIFY NO VISIBLE HEMOLYSIS    Chloride 107 101 - 111 mmol/L     Comment: REPEATED TO VERIFY   CO2 24 22 - 32 mmol/L    Comment: REPEATED TO VERIFY   Glucose, Bld 119 (H) 65 - 99 mg/dL   BUN 11 6 - 20 mg/dL   Creatinine, Ser 0.90 0.61 - 1.24 mg/dL   Calcium 7.7 (L) 8.9 - 10.3 mg/dL    Comment: REPEATED TO VERIFY   GFR calc non Af Amer >60 >60 mL/min   GFR calc Af Amer >60 >60 mL/min    Comment: (NOTE) The eGFR has been calculated using the CKD EPI equation. This calculation has not been validated  in all clinical situations. eGFR's persistently <60 mL/min signify possible Chronic Kidney Disease.    Anion gap 8 5 - 15    Comment: REPEATED TO VERIFY  CBC with Differential/Platelet     Status: Abnormal   Collection Time: 02/28/15  5:45 AM  Result Value Ref Range   WBC 74.4 (HH) 4.0 - 10.5 K/uL    Comment: WHITE COUNT CONFIRMED ON SMEAR CRITICAL RESULT CALLED TO, READ BACK BY AND VERIFIED WITH: T. LAMB RN AT 4081 ON 02.14.17 BY SHUEA    RBC 3.03 (L) 4.22 - 5.81 MIL/uL   Hemoglobin 8.7 (L) 13.0 - 17.0 g/dL   HCT 26.2 (L) 39.0 - 52.0 %   MCV 86.5 78.0 - 100.0 fL   MCH 28.7 26.0 - 34.0 pg   MCHC 33.2 30.0 - 36.0 g/dL   RDW 16.6 (H) 11.5 - 15.5 %   Platelets 23 (LL) 150 - 400 K/uL    Comment: SPECIMEN CHECKED FOR CLOTS REPEATED TO VERIFY PLATELET COUNT CONFIRMED BY SMEAR CRITICAL RESULT CALLED TO, READ BACK BY AND VERIFIED WITH: T. LAMB RN AT [id number] ON 02.14.17 BY SHUEA    Neutrophils Relative % 15 %   Lymphocytes Relative 2 %   Monocytes Relative 65 %   Eosinophils Relative 0 %   Basophils Relative 0 %   Band Neutrophils 0 %   Metamyelocytes Relative 0 %   Myelocytes 0 %   Promyelocytes Absolute 0 %   Blasts 18 %   nRBC 0 0 /100 WBC   Other 0 %   Neutro Abs 11.2 (H) 1.7 - 7.7 K/uL   Lymphs Abs 1.5 0.7 - 4.0 K/uL   Monocytes Absolute 48.4 (H) 0.1 - 1.0 K/uL   Eosinophils Absolute 0.0 0.0 - 0.7 K/uL   Basophils Absolute 0.0 0.0 - 0.1 K/uL   WBC Morphology BLASTS   Basic metabolic panel     Status: Abnormal   Collection Time:  02/28/15  5:45 AM  Result Value Ref Range   Sodium 143 135 - 145 mmol/L   Potassium 3.0 (L) 3.5 - 5.1 mmol/L    Comment: DELTA CHECK NOTED REPEATED TO VERIFY    Chloride 107 101 - 111 mmol/L   CO2 23 22 - 32 mmol/L   Glucose, Bld 92 65 - 99 mg/dL   BUN 13 6 - 20 mg/dL   Creatinine, Ser 1.04 0.61 - 1.24 mg/dL   Calcium 7.9 (L) 8.9 - 10.3 mg/dL   GFR calc non Af Amer >60 >60 mL/min   GFR calc Af Amer >60 >60 mL/min    Comment: (NOTE) The eGFR has been calculated using the CKD EPI equation. This calculation has not been validated in all clinical situations. eGFR's persistently <60 mL/min signify possible Chronic Kidney Disease.    Anion gap 13 5 - 15  Magnesium     Status: Abnormal   Collection Time: 02/28/15  5:45 AM  Result Value Ref Range   Magnesium 1.6 (L) 1.7 - 2.4 mg/dL  CBC with Differential/Platelet     Status: Abnormal   Collection Time: Mar 11, 2015  5:50 AM  Result Value Ref Range   WBC 123.1 (HH) 4.0 - 10.5 K/uL    Comment: WHITE COUNT CONFIRMED ON SMEAR CRITICAL VALUE NOTED.  VALUE IS CONSISTENT WITH PREVIOUSLY REPORTED AND CALLED VALUE.    RBC 2.81 (L) 4.22 - 5.81 MIL/uL   Hemoglobin 8.0 (L) 13.0 - 17.0 g/dL   HCT 24.2 (L) 39.0 - 52.0 %   MCV 86.1  78.0 - 100.0 fL   MCH 28.5 26.0 - 34.0 pg   MCHC 33.1 30.0 - 36.0 g/dL   RDW 18.3 (H) 11.5 - 15.5 %   Platelets 50 (L) 150 - 400 K/uL    Comment: SPECIMEN CHECKED FOR CLOTS PLATELET COUNT CONFIRMED BY SMEAR    Neutrophils Relative % 10 %   Lymphocytes Relative 3 %   Monocytes Relative 72 %   Eosinophils Relative 0 %   Basophils Relative 0 %   Band Neutrophils 0 %   Metamyelocytes Relative 0 %   Myelocytes 0 %   Promyelocytes Absolute 0 %   Blasts 15 %   nRBC 0 0 /100 WBC   Other 0 %   Neutro Abs 12.3 (H) 1.7 - 7.7 K/uL   Lymphs Abs 3.7 0.7 - 4.0 K/uL   Monocytes Absolute 88.6 (H) 0.1 - 1.0 K/uL   Eosinophils Absolute 0.0 0.0 - 0.7 K/uL   Basophils Absolute 0.0 0.0 - 0.1 K/uL   WBC Morphology BLASTS     Smear Review SCHISTOCYTES NOTED ON SMEAR   Basic metabolic panel     Status: Abnormal   Collection Time: 16-Mar-2015  5:50 AM  Result Value Ref Range   Sodium 141 135 - 145 mmol/L   Potassium 3.4 (L) 3.5 - 5.1 mmol/L   Chloride 108 101 - 111 mmol/L   CO2 20 (L) 22 - 32 mmol/L   Glucose, Bld 116 (H) 65 - 99 mg/dL   BUN 22 (H) 6 - 20 mg/dL   Creatinine, Ser 1.32 (H) 0.61 - 1.24 mg/dL   Calcium 7.8 (L) 8.9 - 10.3 mg/dL   GFR calc non Af Amer 54 (L) >60 mL/min   GFR calc Af Amer >60 >60 mL/min    Comment: (NOTE) The eGFR has been calculated using the CKD EPI equation. This calculation has not been validated in all clinical situations. eGFR's persistently <60 mL/min signify possible Chronic Kidney Disease.    Anion gap 13 5 - 15  Magnesium     Status: None   Collection Time: Mar 16, 2015  5:50 AM  Result Value Ref Range   Magnesium 2.1 1.7 - 2.4 mg/dL  Lactate dehydrogenase     Status: Abnormal   Collection Time: 03-16-2015  8:55 AM  Result Value Ref Range   LDH 1170 (H) 98 - 192 U/L  Uric acid     Status: Abnormal   Collection Time: 16-Mar-2015  8:55 AM  Result Value Ref Range   Uric Acid, Serum 7.9 (H) 4.4 - 7.6 mg/dL  Lactic acid, plasma     Status: Abnormal   Collection Time: 16-Mar-2015 12:12 PM  Result Value Ref Range   Lactic Acid, Venous 10.1 (HH) 0.5 - 2.0 mmol/L    Comment: CRITICAL RESULT CALLED TO, READ BACK BY AND VERIFIED WITH: STWEART S RN AT 1257 ON 03/16/15 BY MENDOZA B   Procalcitonin     Status: None   Collection Time: 2015-03-16 12:12 PM  Result Value Ref Range   Procalcitonin 3.69 ng/mL    Comment:        Interpretation: PCT > 2 ng/mL: Systemic infection (sepsis) is likely, unless other causes are known. (NOTE)         ICU PCT Algorithm               Non ICU PCT Algorithm    ----------------------------     ------------------------------         PCT < 0.25 ng/mL  PCT < 0.1 ng/mL     Stopping of antibiotics            Stopping of antibiotics        strongly encouraged.               strongly encouraged.    ----------------------------     ------------------------------       PCT level decrease by               PCT < 0.25 ng/mL       >= 80% from peak PCT       OR PCT 0.25 - 0.5 ng/mL          Stopping of antibiotics                                             encouraged.     Stopping of antibiotics           encouraged.    ----------------------------     ------------------------------       PCT level decrease by              PCT >= 0.25 ng/mL       < 80% from peak PCT        AND PCT >= 0.5 ng/mL            Continuing antibiotics                                               encouraged.       Continuing antibiotics            encouraged.    ----------------------------     ------------------------------     PCT level increase compared          PCT > 0.5 ng/mL         with peak PCT AND          PCT >= 0.5 ng/mL             Escalation of antibiotics                                          strongly encouraged.      Escalation of antibiotics        strongly encouraged.    Dg Chest Port 1 View  2015-03-05  CLINICAL DATA:  Shortness of Breath EXAM: PORTABLE CHEST 1 VIEW COMPARISON:  02/26/2015 FINDINGS: Right chest wall port is again identified. Cardiac shadow is stable. Increasing infiltrative changes are noted throughout the right lung likely representing pulmonary edema. Vascular congestion is again seen. No focal confluent infiltrate is noted. IMPRESSION: Progression of pulmonary edema particularly on the right as described. Electronically Signed   By: Inez Catalina M.D.   On: 03/05/15 11:48    Assessment/Plan 70 year old man with AML who is likely developed acute sepsis with acute delirium. Significance of random choreic type movements of distal extremities is unclear, but likely related to his acute delirium and febrile state. He has no clinical signs of an acute focal neurologic deficit.  Recommendations: 1. CT scan of the  head without and  with contrast when feasible 2. EEG when patient is less agitated and will not likely have brain activity obscured by movement artifact 3. Antibiotic management per primary care team and possibly ID 4. No treatment intervention is indicated for new onset of voluntary movements other than use of sedating medication for agitation.  We will continue to follow this patient with you.  C.R. Nicole Kindred, MD [facility] [phone]  03-30-2015, 2:14 PM

## 2015-03-15 NOTE — Progress Notes (Signed)
Wasted 262ml of fentanyl drip in sink, witnessed with 2nd RN Pamala Hurry May.

## 2015-03-15 NOTE — Consult Note (Signed)
PULMONARY / CRITICAL CARE MEDICINE   Name: [name] MRN: [id number] DOB: 06-Jan-1946    ADMISSION DATE:  02/24/2015 CONSULTATION DATE:  03-14-2015  REFERRING MD:  Dr. Doyle Askew   CHIEF COMPLAINT:  AMS, Fever, Respiratory Distress   HISTORY OF PRESENT ILLNESS:   70 y/o male with PMH of anemia, HTN, GERD, depression, high grade MDS (followed by Dr. Marin Olp) who presented to [facility] on 2/2 with left flank and abdominal pain x 3 weeks.   The patient states that initially he had presented to his oncologist who performed an ultrasound showing splenomegaly. At that time Dr. Marin Olp was concerned about transformation of his MDS into leukemia and bone biopsy was planned. However, he states that soon his pain became unmanageable with home percocet/vicodin and so he came to the hospital. CT scan performed in the ED revealed severe splenomegaly, nearly doubled in size since November. Initial labs showed pancytopenia with WBC 3.4, Hemoglobin 6.6, platelets 36. At that time Dr. Marin Olp recommended admission for pain control, blood transfusion and possible earlier bone marrow biopsy and general surgery was consulted for splenectomy.   Bone marrow biopsy performed 2/3 by IR revealed transformation of MDS to AML and Dr. Marin Olp felt that splenic sequestration in the setting of AML was the etiology of his splenomegaly. Dr. Lucia Gaskins performed laparascopic assisted splenectomy on 2/8 with no apparent complications at the time. The patient has required multiple PRBC and platelet infusions throughout this hospitalization due to pancytopenia. Dr. Marin Olp has had several conversations with the patient regarding chemotherapy for AML and patient is considering palliative chemotherapy options.   In the AM 2/15 patient was noted to be less alert than previously had been and complaining of increasing abdominal pain and nausea. He was also febrile at 103.1 and tachycardic into the 130s with tachypnea and hypotension. Over the past  several days his WBC count has continued to increase (up to 123 on 2/15 from 74 on 2/14). Oncology is concerned for small bowel infiltration of leukocytes vs end stage leukemia and placed patient on Hydrea. Due to mental status and dyspnea patient was transferred to SDU and PCCM was consulted for further management.    PAST MEDICAL HISTORY :  He  has a past medical history of Coronary atherosclerosis of native coronary artery; Essential hypertension; Pure hypercholesterolemia; AAA (abdominal aortic aneurysm) ([place]); Diverticulitis; Osteoarthritis; Tobacco abuse; Normocytic anemia; Cellulitis (06/10/2013); PONV (postoperative nausea and vomiting); Diverticulitis; GERD (gastroesophageal reflux disease); MDS (myelodysplastic syndrome), high grade ([place]) (04/13/2014); Myocardial infarction ([place]) (2010); Depression; Spontaneous pneumothorax; Hypotestosteronemia; Esophageal ulcer (10/2014); DVT (deep vein thrombosis) in pregnancy; Gastritis (10/2014); Sialadenitis; Protein calorie malnutrition ([place]); Bradycardia; and Pneumothorax, left (10/09/2014).  Pt known to have COPD -- not on meds.   PAST SURGICAL HISTORY: He  has past surgical history that includes cardiac STents (2010); Cholecystectomy (N/A, 06/19/2013); Abdominal aortic endovascular stent graft (N/A, 07/02/2013); Esophagogastroduodenoscopy (N/A, 08/05/2013); Eye surgery (Left); Back surgery; Colonoscopy with propofol (N/A, 10/15/2013); Fetal blood transfusion (March 16,17,18, 2016); Bone marrow biopsy (April 01, 2014); Chest tube insertion (09/14/2014); Video assisted thoracoscopy (Left, 10/11/2014); Stapling of blebs (Left, 10/11/2014); Esophagogastroduodenoscopy (N/A, 11/04/2014); and laparoscopic splenectomy (N/A, 03/02/2015).  Allergies  Allergen Reactions  . Cephalexin Other (See Comments)    Nausea and stomach cramps  . Reglan [Metoclopramide] Other (See Comments)    Patient has significant shakes that are troubling. Would not like to receive this if there  are other options available.  . Albuterol-Ipratropium-Soybean Lecithin [Ipratropium-Albuterol] Anxiety  . Compazine [Prochlorperazine] Anxiety  . Hydrocodone  Itching    Takes w benadryl  . Marinol [Dronabinol] Other (See Comments)    "Affects his eyes"  . Oxycodone Itching    Can take with benadryl   . Phenergan [Promethazine Hcl] Other (See Comments)    "restless legs"  . Tramadol Other (See Comments)    "jerking limbs and talking in his sleep"    No current facility-administered medications on file prior to encounter.   Current Outpatient Prescriptions on File Prior to Encounter  Medication Sig  . acetaminophen (TYLENOL) 500 MG tablet Take 500 mg by mouth every 6 (six) hours as needed for fever.  [place] alk-PHENObarbital (DONNATAL) 16.2 MG tablet Take 1 tablet by mouth every 8 (eight) hours as needed.  . diphenhydrAMINE (BENADRYL) 25 mg capsule Take 1 capsule (25 mg total) by mouth every 6 (six) hours as needed for itching. May take with Percocet PRN  . lidocaine-prilocaine (EMLA) cream Apply 1 application topically as needed. Please dispense 2 tubes  . Multiple Vitamins-Minerals (PRESERVISION AREDS 2 PO) Take by mouth 2 (two) times daily.  . nitroGLYCERIN (NITROSTAT) 0.4 MG SL tablet Place 1 tablet (0.4 mg total) under the tongue every 5 (five) minutes as needed for chest pain.  [place] Kitchen oxyCODONE-acetaminophen (PERCOCET) 7.5-325 MG tablet Take 1-2 tablets by mouth every 4 (four) hours as needed for moderate pain or severe pain.  . pantoprazole (PROTONIX) 40 MG tablet Take 1 tablet (40 mg total) by mouth 2 (two) times daily.  . predniSONE (DELTASONE) 20 MG tablet Take 20 mg by mouth as needed (chemo reaction and energy "boost.").   . simvastatin (ZOCOR) 20 MG tablet Take 1 tablet (20 mg total) by mouth at bedtime.  . sucralfate (CARAFATE) 1 GM/10ML suspension Take 10 mLs (1 g total) by mouth 4 (four) times daily -  with meals and at bedtime. (Patient taking differently: Take 1 g by  mouth 4 (four) times daily -  with meals and at bedtime. Takes as needed)  . temazepam (RESTORIL) 15 MG capsule Take 15 mg by mouth at bedtime as needed for sleep.  . mupirocin ointment (BACTROBAN) 2 % Apply 3 times a day to affected area under RIGHT ear. (Patient not taking: Reported on 03/04/2015)    FAMILY HISTORY:  His indicated that his mother is deceased. He indicated that his father is deceased.   Mother had CAD. Father had lung CA. CA in siblings.   SOCIAL HISTORY: He  reports that he has quit smoking. His smoking use included Cigarettes. He started smoking about 50 years ago. He has a 60 pack-year smoking history. He has never used smokeless tobacco. He reports that he does not drink alcohol or use illicit drugs.  He is married, has 1 son. He used to work in [employer]. Currently employed. Denies drinking alcohol. Has been married 66 years.  REVIEW OF SYSTEMS:   Review of systems are limited because patient is confused, encephalopathic. I spoke with the wife and she said that he has been confused and lethargic since February 14. This progressed throughout the day and 2 this morning. Prior to this, he seemed comfortable except for the belly pain related to surgery. Patient has also been complaining of painful and difficult urination the last couple days. Fevers yesterday night during chills this morning. Wife denies any chest pain or cough. Patient complained of nausea and vomiting. He had loose stools today. Rest of review systems unable to be obtained.  SUBJECTIVE:  Difficult to assess as patient is confused and  encephalopathic.  VITAL SIGNS: BP 145/85 mmHg  Pulse 119  Temp(Src) 103.1 F (39.5 C) (Axillary)  Resp 29  Ht '5\' 10"'$  (1.778 m)  Wt 173 lb 12.8 oz (78.835 kg)  BMI 24.94 kg/m2  SpO2 100%  HEMODYNAMICS:  No central lines.   VENTILATOR SETTINGS: Vent Mode:  [-]  FiO2 (%):  [70 %-100 %] 70 %  Patient is on BiPAP 10/5, 70% FiO2. Tidal volume -- 800-900 ml.    INTAKE / OUTPUT: I/O last 3 completed shifts: In: 4766.6 [P.O.:2; I.V.:4044.6; IV [id number]:017] Out: 1025 [Urine:1025]  PHYSICAL EXAMINATION: General:  Confused, does not follow commands, in respiratory distress despite BiPAP, at times with agonal breathing. Neuro:  Limited as patient is confused. Pupils 3 and an equal and reactive bilaterally. Positive corneals. No facial asymmetry. Supple neck. Does not follow commands with difficult to do rest of the exam. HEENT:  Pupils 3 mm equally reactive bilaterally. Anicteric sclerae. Difficult to examine airway as patient does not follow commands. He has BiPAP on. Could not see whether he has thrush. No neck vein distention. No lymphadenopathy. Cardiovascular:  Tachycardic. No murmurs appreciated. No S3. No gallop. No heaves. Lungs:  In respiratory distress despite BiPAP. No alar flaring. Some accessory muscle use. [place] air entry. Wheezing mid to bases. Crackles posteriorly at the bases. No rhonchi. Abdomen:   Diminished bowel sounds. Soft. Mildly distended. No masses or tenderness elicited. Difficult to examine liver size. Musculoskeletal:  He was flailing around earlier with equal movements of extremities. Difficult to examine now because she does not follow commands. Skin:  He had mottling earlier this afternoon which has gotten better with BiPAP. Grade 1 edema bilaterally. No rash seen. He has a port in the right anterior chest which is chronic and does not look like it's infected.  LABS:  BMET  Recent Labs Lab 02/27/15 1809 02/28/15 0545 2015/03/21 0550  NA 139 143 141  K 5.9* 3.0* 3.4*  CL 107 107 108  CO2 24 23 20*  BUN 11 13 22*  CREATININE 0.90 1.04 1.32*  GLUCOSE 119* 92 116*    Electrolytes  Recent Labs Lab 02/26/15 0620  02/27/15 1809 02/28/15 0545 03/21/2015 0550  CALCIUM 7.3*  < > 7.7* 7.9* 7.8*  MG 1.8  --   --  1.6* 2.1  < > = values in this interval not displayed.  CBC  Recent Labs Lab 02/27/15 0610  02/28/15 0545 03-21-15 0550  WBC 37.0* 74.4* 123.1*  HGB 9.3* 8.7* 8.0*  HCT 27.4* 26.2* 24.2*  PLT 6* 23* 50*    Coag's  Recent Labs Lab 02/26/15 0620  APTT 35  INR 1.19    Sepsis Markers  Recent Labs Lab 2015/03/21 1212  LATICACIDVEN 10.1*  PROCALCITON 3.69    ABG  Recent Labs Lab Mar 21, 2015 1521  PHART 7.154*  PCO2ART 32.7*  PO2ART 118*    Liver Enzymes No results for input(s): AST, ALT, ALKPHOS, BILITOT, ALBUMIN in the last 168 hours.  Cardiac Enzymes No results for input(s): TROPONINI, PROBNP in the last 168 hours.  Glucose No results for input(s): GLUCAP in the last 168 hours.  Imaging Dg Chest Port 1 View  2015-03-21  CLINICAL DATA:  Shortness of Breath EXAM: PORTABLE CHEST 1 VIEW COMPARISON:  02/26/2015 FINDINGS: Right chest wall port is again identified. Cardiac shadow is stable. Increasing infiltrative changes are noted throughout the right lung likely representing pulmonary edema. Vascular congestion is again seen. No focal confluent infiltrate is noted. IMPRESSION: Progression of pulmonary edema  particularly on the right as described. Electronically Signed   By: Inez Catalina M.D.   On: 2015-03-28 11:48     STUDIES:  02/23/2015 CT abd/pelvis:  Severe splenomegaly. Trace pelvic fluid.   CULTURES: 02/27/15 Urine cultrue Enterococcus faecalis Pansensitive  ANTIBIOTICS: Zosyn and Vanc and Levaquin - D1 Fluconazole - Got 5 doses until 2/11  SIGNIFICANT EVENTS: 02/27/15 Pt had Bone Marrow Bx which showed MDS transformation to AML 2/8/`17 Pt had Laparoscopic Splenectomy  LINES/TUBES: Pt has a chronic R sided Port.   DISCUSSION: 70 year old male, admitted for worsening abdominal pain, known to have high-grade MDS, ended up having bone marrow biopsy which showed transformation to AML. Patient had splenectomy on February 8. Patient with worsening mental status, fevers, increasing WBC count (as high as 564,332), metabolic acidosis. ABG on 2/15 showed  7.1/32/118 on 100% NRBM. Chest x-ray on February 14 and February 15 showed pulmonary edema. Patient is also 15 L positive since admission. Has received several units of blood and platelet for anemia and thrombocytopenia.   ASSESSMENT / PLAN:  PULMONARY A: Acute hypoxemic respiratory failure secondary to the following: Pulmonary edema, possible healthcare associated pneumonia in a patient without a spleen, AML blastic crisis. Patient is known to have COPD but I do not think it's in exacerbation. P:   Family was okay with BiPAP. On nonrebreather mask, he was more in respiratory distress. With BiPAP 10/5 70% FiO2, he seems relatively more comfortable. O2 sats in the mid 90s range. Pulling TV 600-700 ml.  Continue BiPAP for now. 10/5 70%.  He is a DO NOT INTUBATE. Need to repeat an ABG at 5 PM. If PO2 is better, may decrease his FiO2 to 50-60% as long as sats are at least 90%. Repeat ABG in the morning.   CARDIOVASCULAR A:  Patient is known to have CAD. Currently has sinus tachycardia. Sinus tachycardia could be related to sepsis, acidosis, possible blastic crisis. Denies any chest pain per wife. The patient was hypotensive in the 95J systolic at the floors. In the ICU, his blood pressure shot up to 884 systolic. Currently, he is blood pressure is 166-063 systolic on the BiPAP.  P:  Treat underlying problem for the sinus tachycardia. Observe blood pressure for now. If he remains hypertensive, may try Lasix 40 mg IV 1. Patient is 50 L positive since admission.  RENAL A:   Acute kidney injury secondary to sepsis, UTI, possible blastic crisis. Patient with metabolic acidosis related to sepsis, possible blastic crisis. P:   Check I's and O's every hourly. If decreasing urine output, suggest giving Lasix 40 mg IV 1. I have switched his fluids to D5 water with 2 Amps of sodium bicarbonate. I also gave him 2 Amps of bicarbonate push.  Plan to repeat ABG at 5 PM. If he still has significant  acidosis, suggest pushing another amp or 2 of sodium bicarbonate.   GASTROINTESTINAL A:   Possible C. Difficile. Nausea and vomiting but could be related to sepsis, metabolic acidosis. I doubt there is a surgical abdomen at present. P:   Keep nothing by mouth. We will need to send stool for C. difficile colitis. On Flagyl 500 mg IV every 8 hours. He's currently on IV vancomycin.  HEMATOLOGIC A:   Recent conversion to AML from high-grade MDS. Concern for blastic crisis. P:  The team spoke with Dr. Marin Olp this afternoon and he has been updated. He does not think it's primarily a blastic crisis. We'll add steroids. Solu-Medrol 40 mg every  6 hours. May need to switch hydroxyurea to an  IV agent if he continues to be nothing by mouth.  INFECTIOUS A:   Sepsis secondary to UTI (Enterococcus faecalis), possible healthcare associated pneumonia, possible C. difficile colitis. Concern for bacteremia related to encapsulated bacteria since patient is asplenic. Concern for MRSA infection. Concern for gram-negative bacteremia. Possible fungal infection given's wife account of oral thrush. P:   Panculture. We'll send cultures again today. Patient has a port and we need to obtain cultures from that. Continue Zosyn and vancomycin. Start levofloxacin to double cover gram-negative bacteria. Start Flagyl for possible C. difficile colitis. Ideally should be by mouth vancomycin but patient cannot tolerate by mouth Plan to de-escalate antibiotics once with cultures. Start fluconazole IV.  ENDOCRINE A:   BS have been acceptable. No history of diabetes P:   Check blood sugar every 4. Keep BS less than 180 mg percent  NEUROLOGIC A:   Confusion, delirium related to underlying medical issues. Patient with chronic pain. P:   Hold off on sedation. Patient however  is chronically on fentanyl patch at 50 g an hour. I will keep him on the same dose unless sensorium worsens. He does not have  hypercapnia. If he gets agitated, suggest to start Precedex drip.   FAMILY  - Updates: I extensively discussed the plan of care, treatment options, CODE STATUS with the wife, and the rest of the family. Wife states that patient is a DO NOT RESUSCITATE, no CPR, no hemodialysis. Wife is okay with current level of care and with the BiPAP use. If patient gets agitated with the BiPAP, wife requests we switch it back to nonrebreather mask. They understand the severity of illness and critical nature of disease..  - Inter-disciplinary family meet or Palliative Care meeting due by:  03/03/15   ATTENDING NOTE: I  have personally reviewed patient's available data, including medical history, events of note, physical examination and test results as part of my evaluation. I have discussed with NP Noe Gens and other care providers such as pharmacist, RN and RRT.   The patient is critically ill with multiple organ systems failure and requires high complexity decision making for assessment and support, frequent evaluation and titration of therapies, application of advanced monitoring technologies and extensive interpretation of multiple databases.    I have spent 75 minutes of critical care time with this patient today.   Elsie Saas A. Corrie Dandy, MD 03/12/15, 5:07 PM [facility] Pager [phone] After 3 pm or if no answer, call [phone]

## 2015-03-15 NOTE — Progress Notes (Signed)
[facility] Progress Note Patient Name: [name] DOB: Jun 26, 1945 MRN: [id number]   Date of Service  March 07, 2015  HPI/Events of Note  I was asked to review [name] situation this evening as his condition has been rapidly deteriorating in the last few hours.  He had MDS but now has an acute leukemia and underwent a splenectomy during this hospitalizaiton.  In the last several days his WBC has increased 5 fold to ~125K. Today he has developed progressive confusion, lactic acidosis, and tachycardia.  He was transferred to the ICU, bicarbonate, steroids, and BIPAP were administered in addition to empiric antibiotics for known (UTI) and possible infections (C diff, bacteremia, others. Despite these interventions [name] mental status has declined to the point that he his now unresponsive and his acidosis is worsening (pH now 7.0).  His wife has indicated that she wanted care limitation including no CPR, no mechanical ventilation.    I discussed the situation with Dr. Corrie Dandy and then with the patients wife and son.  I explained that his rapidly deteriorating state despite our interventions in the ICU that his death is now inevitable.   She again re-iterated that she did not want mechanical ventilation or CPR or other invasive procedures.  eICU Interventions  Based on our conversation I have written the comfort care order set.  Will start morphine, discontinue BIPAP and the bicarb drip.     Intervention Category Major Interventions: End of life / care limitation discussion  Simonne Maffucci 2015-03-07, 6:45 PM

## 2015-03-15 NOTE — Progress Notes (Addendum)
Patient ID: [name], male   DOB: 01/11/1946, 70 y.o.   MRN: [id number]  [facility] PROGRESS NOTE  [name] [id number] DOB: 1945-01-22 DOA: 03/13/2015 PCP: Shirline Frees, MD   Brief narrative:    70 yo man with MDS, anemia, CAD, HTN, AAA with repair, who presented with left flank and upper quadrant pain. He presented to his oncologist Dr. Jonette Eva, requested [place] which showed splenomegaly. Dr. Marin Olp at that time was concerned for transformation of his MDS into leukemia, but there was no definitive signs so BM biopsy was planned. Dr. Marin Olp recommended admission for pain control and possible earlier BM biopsy.   Major events since admission: 2/15 - worse mental status, ? Sepsis, started sepsis protocol, PCCM consulted, transfer to SDU    Assessment/Plan:    Principal Problem:   Severe sepsis ([place]) - unclear etiology but clearly pt meets criteria, it is unclear if related to AML or other source  - T 103.1 F, HR > 120, lactic acid > 10 - sepsis protocol initiated, vanc and zosyn started 2/15 - UA, blood and urine culture requested - transfer to SDU, PCCM consulted - d/w family at bedside at length critical nature of this progressive and aggressive illness - family made aware that our medical interventions may not help and we have to address further goals - it is very clear from family that focus is on comfort at every step of management but since this kind of accelerate this AM, family wants to think more about this, agreeable to PCT consultation as well, order placed   Active Problems:   Acute respiratory failure with hypoxia - transfer to SDU, ? Pulmonary vascular congestion and COPD - keep on Oxygen for now - follow up on PCCM recommendations     Acute encephalopathy - secondary to sepsis, AML progression - transfer to SDU - ? Seizure given some twitching - neurology consulted     Massive diarrhea this AM - stool panel requested as well as C. Diff      MDS (myelodysplastic syndrome), AML high grade (HCC) - appreciate Dr. Jonette Eva assistance     Splenomegaly s/p splenectomy 02/27/2015 - surgery team following    Projectile vomiting with nausea - unclear etiology - still unable to tolerate any intake, suspect oral thrush contributing - keep NPO, stop all PO meds and transition to IV if possible     Acute renal failure ([place]) - bladder scan - ? AML infiltration vs acute retention vs sepsis and dehydration  - keep on IVF but this is rather challenging as pt now with pulmonary vascular congestion  - BMP In AM - close monitoring in SDU     Pulmonary vascular congestion - unfortunately difficult to hydrate with known pulm congestion  - d/w PCCM    Antineoplastic chemotherapy induced anemia - Hg ~8 - CBC in AM    Thrombocytopenia (HCC) - better this AM    Essential hypertension - now hypotensive due to sepsis  - on IVF     Coronary atherosclerosis of native coronary artery    COPD (chronic obstructive pulmonary disease) (HCC) - wheezing noted this AM - allow BD's as needed but wife very reluctant to have this on board due to side effects    Protein-calorie malnutrition, severe - in the context of progressive illness - still NPO and complicated by dysphagia and oral thrush     Hypokalemia and hypomagnesemia  - supplement and repeat BMP , Mg in AM  DVT prophylaxis  Code Status: Full.  Family Communication:  plan of care discussed with multiple family members at bedside  Disposition Plan: Not ready for d/c, too sick, must go to SDU  IV access:  Peripheral IV  Procedures and diagnostic studies:    Dg Chest 2 View 02/26/2015  Pulmonary vascular congestion with mild interstitial prominence. Mild interstitial edema not excluded. Small bilateral pleural effusions, mild bibasilar atelectasis, cardiomegaly and COPD changes.   Ct Abdomen Pelvis W Contrast 02/21/2015 Severe splenomegaly, nearly doubled in size since November. No  evidence of acute splenic rupture, but there is abnormal hypoenhancement along the superior pole of the spleen and trace perisplenic inflammation or fluid which is nonspecific. Developing splenic infarct is a consideration. 2. Trace pelvic free fluid, favor reactive to #1.   US Abdomen Limited 02/13/2015  Severe splenomegaly, measuring up to 18.6 cm in maximal dimension   Ct Biopsy 02/17/2015 Technically successful CT guided right iliac bone core and aspiration biopsy.   Dg Chest Port 1 View 2015-03-03  Progression of pulmonary edema particularly on the right as described.   Dg Abd Portable 2v 02/21/2015  No bowel dilatation. Scattered air-fluid levels are present. Suspect early ileus or a degree of enteritis. Obstruction is not appear likely. No free air. Inferior vena cava filter present.   Medical Consultants:  Oncology Surgery Neurology  PCT PCCM  Other Consultants:  None  IAnti-Infectives:   Vancomycin 2/15 --> Zosyn 2/15 -->  Faye Ramsay, MD  [facility] Pager [phone]  If 7PM-7AM, please contact night-coverage [email or web address] Password [facility] 2015/03/03, 1:49 PM   LOS: 13 days   HPI/Subjective: More confused this AM.  Objective: Filed Vitals:   02/28/15 2049 02/28/15 2325 Mar 03, 2015 0537 03-Mar-2015 1323  BP: 141/74  99/57 92/48  Pulse: 124  89 129  Temp: 99.5 F (37.5 C) 97.9 F (36.6 C) 97.6 F (36.4 C) 103.1 F (39.5 C)  TempSrc: Oral  Oral Axillary  Resp: 18  18 34  Height:      Weight:   78.835 kg (173 lb 12.8 oz)   SpO2:   95% 88%    Intake/Output Summary (Last 24 hours) at 2015-03-03 1349 Last data filed at 03-03-2015 0700  Gross per 24 hour  Intake 3107.33 ml  Output    100 ml  Net 3007.33 ml    Exam:   General:  Pt is confused and restless, in mild distress   Cardiovascular: Regular rhythm, tachycardic, no rubs, no gallops  Respiratory: diminished breath sounds bilaterally with exp wheezing   Abdomen: Soft, non tender, non distended, bowel sounds  present, no guarding  Extremities: pulses DP and PT palpable bilaterally  Data Reviewed: Basic Metabolic Panel:  Recent Labs Lab 02/25/15 0417 02/26/15 0620 02/27/15 0610 02/27/15 1809 02/28/15 0545 03/03/2015 0550  NA  --  138 140 139 143 141  K  --  3.1* 2.7* 5.9* 3.0* 3.4*  CL  --  107 105 107 107 108  CO2  --  19* 24 24 23  20*  GLUCOSE  --  99 89 119* 92 116*  BUN  --  10 12 11 13  22*  CREATININE  --  0.91 0.96 0.90 1.04 1.32*  CALCIUM  --  7.3* 7.5* 7.7* 7.9* 7.8*  MG 1.5* 1.8  --   --  1.6* 2.1   CBC:  Recent Labs Lab 02/25/15 0410 02/26/15 0620 02/27/15 0610 02/28/15 0545 03-Mar-2015 0550  WBC 24.4* 27.5* 37.0* 74.4* 123.1*  NEUTROABS 10.0* 13.2* 4.4 11.2* 12.3*  HGB 9.2*  9.4* 9.3* 8.7* 8.0*  HCT 28.2* 27.5* 27.4* 26.2* 24.2*  MCV 88.1 85.1 86.2 86.5 86.1  PLT 17* 19* 6* 23* 50*    Recent Results (from the past 240 hour(s))  Surgical pcr screen     Status: None   Collection Time: 02/21/15  8:58 PM  Result Value Ref Range Status   MRSA, PCR NEGATIVE NEGATIVE Final   Staphylococcus aureus NEGATIVE NEGATIVE Final    Comment:        The Xpert SA Assay (FDA approved for NASAL specimens in patients over 71 years of age), is one component of a comprehensive surveillance program.  Test performance has been validated by [facility] for patients greater than or equal to 43 year old. It is not intended to diagnose infection nor to guide or monitor treatment.   Culture, Urine     Status: None   Collection Time: 02/27/15 12:15 PM  Result Value Ref Range Status   Specimen Description URINE, CLEAN CATCH  Final   Special Requests NONE  Final   Culture   Final    10,000 COLONIES/mL ENTEROCOCCUS FAECALIS Performed at [facility]    Report Status 04-Mar-2015 FINAL  Final   Organism ID, Bacteria ENTEROCOCCUS FAECALIS  Final      Susceptibility   Enterococcus faecalis - MIC*    AMPICILLIN <=2 SENSITIVE Sensitive     LEVOFLOXACIN 1 SENSITIVE Sensitive      NITROFURANTOIN <=16 SENSITIVE Sensitive     VANCOMYCIN 2 SENSITIVE Sensitive     * 10,000 COLONIES/mL ENTEROCOCCUS FAECALIS     Scheduled Meds: . acetaminophen (TYLENOL) oral liquid 160 mg/5 mL  1,000 mg Oral Q6H  . feeding supplement (ENSURE ENLIVE)  237 mL Oral BID BM  . feeding supplement (ENSURE ENLIVE)  237 mL Oral BID BM  . fentaNYL  50 mcg Transdermal Q72H  . fluticasone  2 spray Each Nare Daily  . hydroxyurea  1,000 mg Oral BID  . lip balm  1 application Topical BID  . loratadine  10 mg Oral Daily  . magic mouthwash  15 mL Oral QID  . methocarbamol  1,000 mg Oral TID  . nystatin  5 mL Oral QID  . pantoprazole (PROTONIX) IV  40 mg Intravenous Q12H  . piperacillin-tazobactam  3.375 g Intravenous Once  . piperacillin-tazobactam (ZOSYN)  IV  3.375 g Intravenous Q8H  . polyethylene glycol  17 g Oral Daily  . sodium chloride flush  10-40 mL Intracatheter Q12H  . vancomycin  1,500 mg Intravenous Once  . vancomycin  750 mg Intravenous BID   Continuous Infusions: . sodium chloride 125 mL/hr at 03/04/15 0210

## 2015-03-15 NOTE — Discharge Summary (Addendum)
Death Summary  ISSACHAR HAWRYLUK [id number] DOB: 22-Mar-1945 DOA: Mar 07, 2015  PCP: Shirline Frees, MD PCP/Office notified:   Admit date: 2015-03-07 Date of Death: 03/20/2015  Final Diagnoses:  Principal Problem:   Severe sepsis ([place]) Active Problems:   MDS (myelodysplastic syndrome), high grade ([place])   Splenomegaly s/p splenectomy 03/14/2015   AML (acute myeloid leukemia) ([place])   Projectile vomiting with nausea   Acute renal failure (HCC)   Pulmonary vascular congestion   Antineoplastic chemotherapy induced anemia   Thrombocytopenia (HCC)   Essential hypertension   Coronary atherosclerosis of native coronary artery   COPD (chronic obstructive pulmonary disease) (HCC)   Protein-calorie malnutrition, severe   Coagulopathy (HCC)   Hypokalemia   Pressure ulcer   Brief narrative:    70 yo man with MDS, anemia, CAD, HTN, AAA with repair, who presented with left flank and upper quadrant pain. He presented to his oncologist Dr. Jonette Eva, requested [place] which showed splenomegaly. Dr. Marin Olp at that time was concerned for transformation of his MDS into leukemia, but there was no definitive signs so BM biopsy was planned. Dr. Marin Olp recommended admission for pain control and possible earlier BM biopsy.   Major events since admission: Mar 19, 2022 - worse mental status, ? Sepsis, started sepsis protocol, PCCM consulted, transfer to SDU    Assessment/Plan:    Principal Problem:  Severe sepsis ([place]) - ? AML infiltration  - T 103.1 F, HR > 120, lactic acid > 10 - sepsis protocol initiated, vanc and zosyn started 2022/03/19 - unfortunately pt continued to deteriorate and despite aggressive measures has not responded - family asked to ensure comfort and pt passed away at 2103-05-05 on 2015/03/20  Active Problems:  Acute respiratory failure with hypoxia - transferred to SDU, ? Pulmonary vascular congestion and COPD   Acute encephalopathy - secondary to sepsis, AML progression - seizures could not be  ruled out and specifically AML infiltration to brain    Massive diarrhea this AM - one episode reported in 24 hours    MDS (myelodysplastic syndrome), AML high grade (HCC) - appreciate Dr. Jonette Eva assistance    Splenomegaly s/p splenectomy 03/06/2015 - continued deterioration  - with no spleen, WBC simply could not have been cleared out of the system and severe leukocytosis caused medical deterioration and resulted in death    Projectile vomiting with nausea - unclear etiology - still unable to tolerate any intake, suspect oral thrush contributing - keep NPO, stop all PO meds and transition to IV if possible    Acute renal failure ([place]) - bladder scan - ? AML infiltration vs acute retention vs sepsis and dehydration  - keep on IVF but this is rather challenging as pt now with pulmonary vascular congestion  - BMP In AM - close monitoring in SDU    Pulmonary vascular congestion - unfortunately difficult to hydrate with known pulm congestion    Antineoplastic chemotherapy induced anemia - Hg ~8   Thrombocytopenia (HCC) - from malignancy    Essential hypertension   Coronary atherosclerosis of native coronary artery   COPD (chronic obstructive pulmonary disease) ([place]) - allowed comfort care and family appreciated efforts    Protein-calorie malnutrition, severe - in the context of progressive illness    Skin irritation in sacral area - likely from bed bound status    Hypokalemia and hypomagnesemia  - supplemented    DVT prophylaxis  Code Status: DNR  IV access:  Peripheral IV  Procedures and diagnostic studies:   Dg Chest 2 View 02/26/2015  Pulmonary vascular congestion with mild interstitial prominence. Mild interstitial edema not excluded. Small bilateral pleural effusions, mild bibasilar atelectasis, cardiomegaly and COPD changes.   Ct Abdomen Pelvis W Contrast 02/28/2015 Severe splenomegaly, nearly doubled in size since November. No  evidence of acute splenic rupture, but there is abnormal hypoenhancement along the superior pole of the spleen and trace perisplenic inflammation or fluid which is nonspecific. Developing splenic infarct is a consideration. 2. Trace pelvic free fluid, favor reactive to #1.   US Abdomen Limited 02/13/2015 Severe splenomegaly, measuring up to 18.6 cm in maximal dimension   Ct Biopsy 02/17/2015 Technically successful CT guided right iliac bone core and aspiration biopsy.   Dg Chest Port 1 View 03/26/15 Progression of pulmonary edema particularly on the right as described.   Dg Abd Portable 2v 03/07/2015 No bowel dilatation. Scattered air-fluid levels are present. Suspect early ileus or a degree of enteritis. Obstruction is not appear likely. No free air. Inferior vena cava filter present.   Medical Consultants:  Oncology Surgery Neurology  PCT PCCM  Other Consultants:  None  IAnti-Infectives:   Vancomycin 2/15 --> Zosyn 2/15 -->  Faye Ramsay, MD [facility] Pager [phone]     Signed:  Faye Ramsay  [facility] 03/08/2015, 7:50 PM

## 2015-03-15 NOTE — Progress Notes (Signed)
OT Cancellation Note  Patient Details Name: [name] MRN: [id number] DOB: [birth year]   Cancelled Treatment:    Reason Eval/Treat Not Completed: Other (comment).  Noted neuro note:  Pt with acute delirium and choreic movements.   Will check back another day.  Daelynn Blower 03/31/15, 3:36 PM  Lesle Chris, OTR/L [phone] 03-31-2015

## 2015-03-15 NOTE — Progress Notes (Signed)
Pharmacy Antibiotic Note  [name] is a 70 y.o. male admitted on 03/13/2015 with MDS newly transformed to AML and splenomegaly.  Underwent splenectomy on 2/8 without complications.  Today noted to be febrile, tachycardic, tachypneic, and hypotensive, with Lactic acid > 10.  Pharmacy has been consulted for vancomycin and Zosyn dosing.  Plan:  Vancomycin 1500 mg IV now, then 750 mg IV q12 hr; goal trough 15-20 mcg/mL.  Measure vancomycin trough levels at steady state as indicated.  Zosyn 3.375 g IV given once over 30 minutes, then every 8 hrs by 4-hr infusion   Height: 5\' 10"  (177.8 cm) Weight: 173 lb 12.8 oz (78.835 kg) IBW/kg (Calculated) : 73  Temp (24hrs), Avg:99.4 F (37.4 C), Min:97.6 F (36.4 C), Max:103.1 F (39.5 C)   Recent Labs Lab 02/25/15 0410 02/26/15 0620 02/27/15 0610 02/27/15 1809 02/28/15 0545 Mar 03, 2015 0550 03-03-2015 1212  WBC 24.4* 27.5* 37.0*  --  74.4* 123.1*  --   CREATININE 0.99 0.91 0.96 0.90 1.04 1.32*  --   LATICACIDVEN  --   --   --   --   --   --  10.1*    Estimated Creatinine Clearance: 55.3 mL/min (by C-G formula based on Cr of 1.32).    Allergies  Allergen Reactions  . Cephalexin Other (See Comments)    Nausea and stomach cramps  . Reglan [Metoclopramide] Other (See Comments)    Patient has significant shakes that are troubling. Would not like to receive this if there are other options available.  . Albuterol-Ipratropium-Soybean Lecithin [Ipratropium-Albuterol] Anxiety  . Compazine [Prochlorperazine] Anxiety  . Hydrocodone Itching    Takes w benadryl  . Marinol [Dronabinol] Other (See Comments)    "Affects his eyes"  . Oxycodone Itching    Can take with benadryl   . Phenergan [Promethazine Hcl] Other (See Comments)    "restless legs"  . Tramadol Other (See Comments)    "jerking limbs and talking in his sleep"    Antimicrobials this admission: Diflucan 2/6 >> 2/11 Vancomycin 2/15 >>  Zosyn 2/15 >>   Dose adjustments this  admission: ---  Microbiology results: 2/13 UCx: 10k E faecalis S-Amp 2/15 BCx: IP 2/7 MRSA PCR: neg  Thank you for allowing pharmacy to be a part of this patient's care.  Reuel Boom, PharmD, BCPS Pager: [phone] 2015-03-03, 4:20 PM

## 2015-03-15 NOTE — Care Management Note (Signed)
Case Management Note  Patient Details  Name: [name] MRN: [id number] Date of Birth: Jul 30, 1945  Subjective/Objective:  Transferred to SDU-elevated lactic acid,change in mental status, temp-103.1. AML, pod#7 splenectomy.Onc/ccs following. PT-recc HHPT.[place] list given-await choice.                 Action/Plan:d/c plan home.   Expected Discharge Date:                  Expected Discharge Plan:  [place]  In-House Referral:     Discharge planning Services  CM Consult  Post Acute Care Choice:    Choice offered to:  Patient  DME Arranged:    DME Agency:     HH Arranged:    [place] Agency:     Status of Service:  In process, will continue to follow  Medicare Important Message Given:  Yes Date Medicare IM Given:    Medicare IM give by:    Date Additional Medicare IM Given:    Additional Medicare Important Message give by:     If discussed at [place] of Stay Meetings, dates discussed:    Additional Comments:  Dessa Phi, RN 03/05/2015, 2:10 PM

## 2015-03-15 NOTE — Progress Notes (Signed)
Completed prior auth for [facility] per Dr Antonieta Pert orders. Insurance has denied coverage.   Notified Dr Marin Olp and he has chosen NOT TO pursue an appeal.

## 2015-03-15 NOTE — Progress Notes (Signed)
Transferred to ICU/stepdown

## 2015-03-15 DEATH — deceased

## 2015-03-20 ENCOUNTER — Other Ambulatory Visit: Payer: Medicare Other

## 2015-03-27 ENCOUNTER — Other Ambulatory Visit: Payer: Medicare Other

## 2015-03-27 ENCOUNTER — Ambulatory Visit: Payer: Medicare Other | Admitting: Hematology & Oncology

## 2015-03-30 NOTE — Progress Notes (Signed)
   02/23/15 1049  PT G-Codes **NOT FOR INPATIENT CLASS**  Functional Assessment Tool Used clinical Judgement  Functional Limitation Mobility: Walking and moving around  Mobility: Walking and Moving Around Current Status [phone]) CJ  Mobility: Walking and Moving Around Goal Status [id number]) CI  Entered based on evaluation written and conducted by Tresa Endo, PT.  Clide Dales, PT Pager: [phone] 03/30/2015

## 2015-04-03 ENCOUNTER — Other Ambulatory Visit: Payer: Medicare Other

## 2015-04-03 ENCOUNTER — Ambulatory Visit: Payer: Medicare Other | Admitting: Hematology & Oncology

## 2015-04-10 ENCOUNTER — Other Ambulatory Visit: Payer: Medicare Other

## 2015-04-17 ENCOUNTER — Other Ambulatory Visit: Payer: Medicare Other

## 2015-04-24 ENCOUNTER — Other Ambulatory Visit: Payer: Medicare Other

## 2015-05-01 ENCOUNTER — Other Ambulatory Visit: Payer: Medicare Other

## 2015-05-08 ENCOUNTER — Other Ambulatory Visit: Payer: Medicare Other

## 2015-05-29 ENCOUNTER — Ambulatory Visit: Payer: Medicare Other | Admitting: Surgery

## 2015-05-29 ENCOUNTER — Other Ambulatory Visit (HOSPITAL_COMMUNITY): Payer: Medicare Other

## 2015-07-05 ENCOUNTER — Other Ambulatory Visit: Payer: Self-pay | Admitting: Nurse Practitioner

## 2015-12-01 IMAGING — CT CT ABD-PELV W/ CM
2 of 5 series · 15 of 46 positions shown, 17 images · IV contrast (OMNIPAQUE 300)
Comparison: Prior CT abdomen/pelvis 11/02/2014

CLINICAL DATA: 68-year-old male with persistent nausea, vomiting
and generalized sharp stabbing abdominal pain

EXAM:
CT ABDOMEN AND PELVIS WITH CONTRAST
TECHNIQUE: Multidetector CT imaging of the abdomen and pelvis was performed
using the standard protocol following bolus administration of
intravenous contrast.
CONTRAST:  100mL OMNIPAQUE IOHEXOL 300 MG/ML  SOLN

[Series 2: abd/pel with · axial · 0.75mm/px · z∈[-288,+137]mm · 12 of 96 slices shown, 14 images]
[im 6/96  soft-tissue]
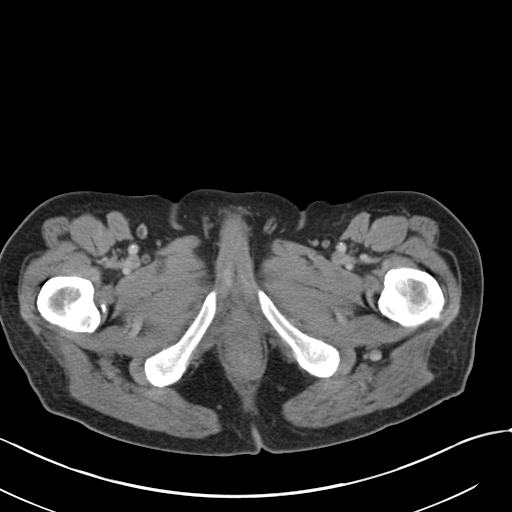
[im 6/96  bone]
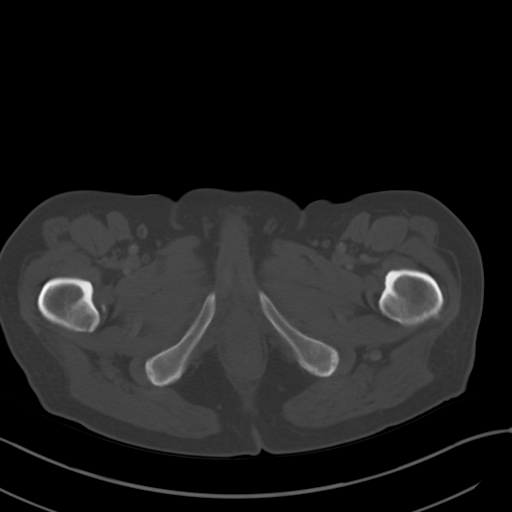
[im 16/96  soft-tissue]
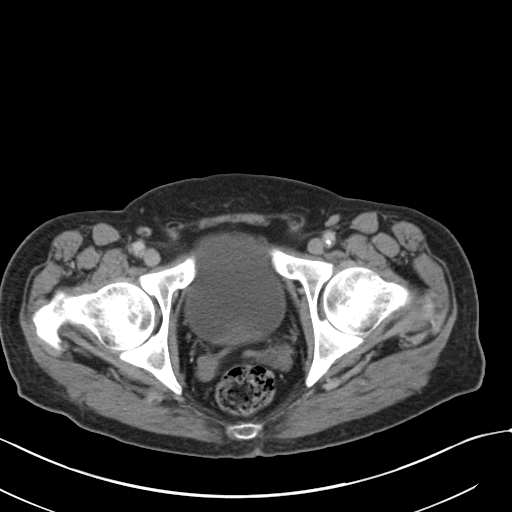
[im 21/96  soft-tissue]
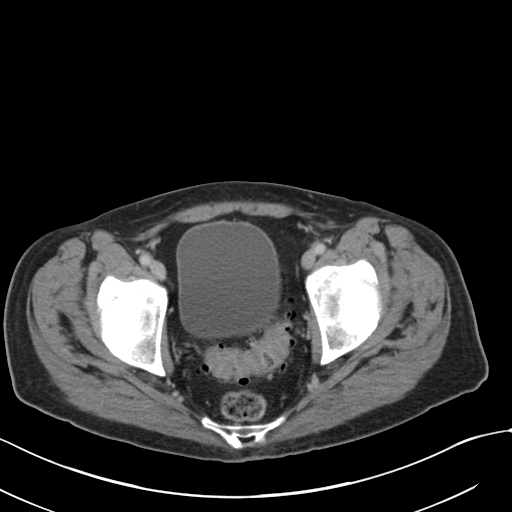
[im 31/96  soft-tissue]
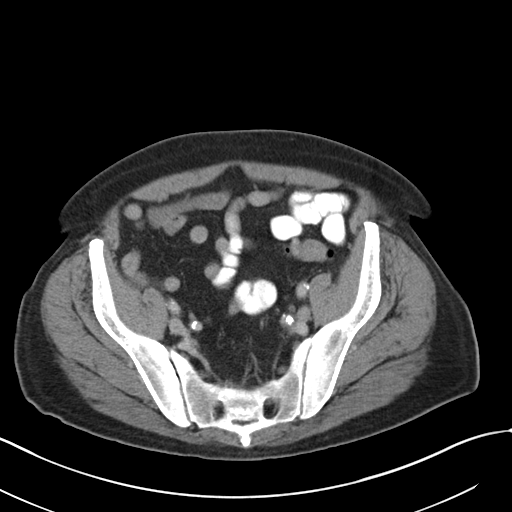
[im 36/96  soft-tissue]
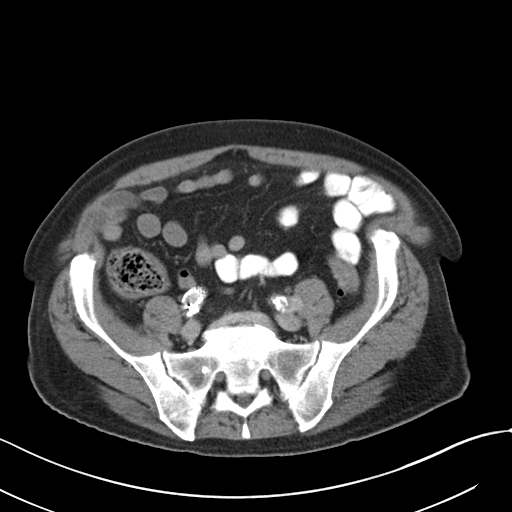
[im 46/96  soft-tissue]
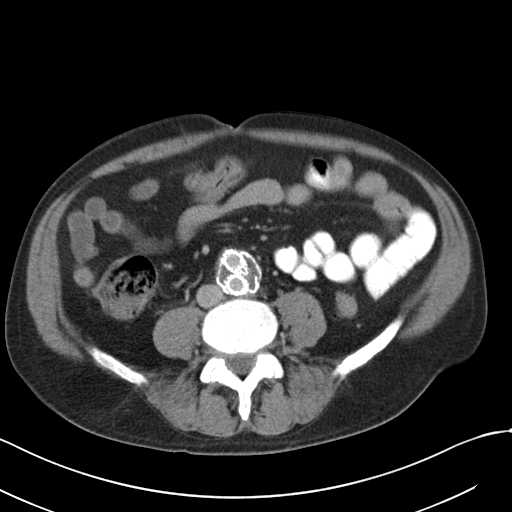
[im 51/96  soft-tissue]
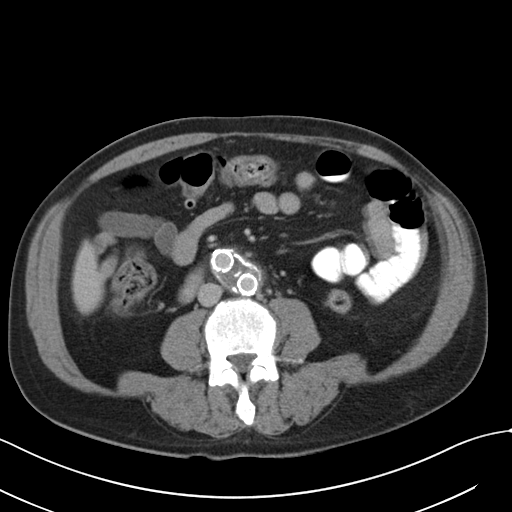
[im 61/96  soft-tissue]
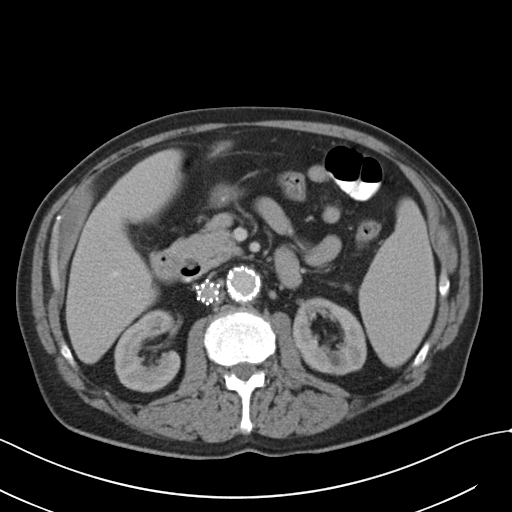
[im 66/96  soft-tissue]
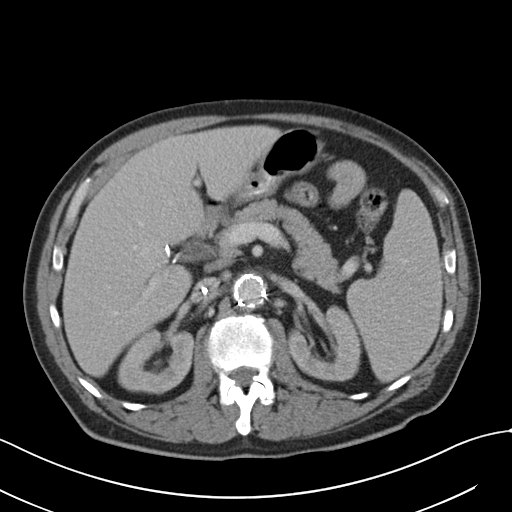
[im 66/96  bone]
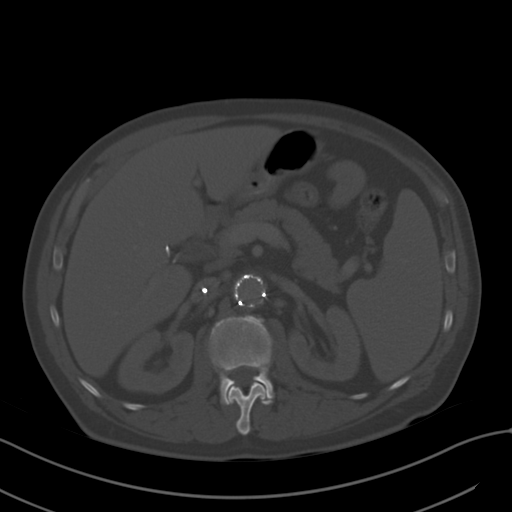
[im 76/96  soft-tissue]
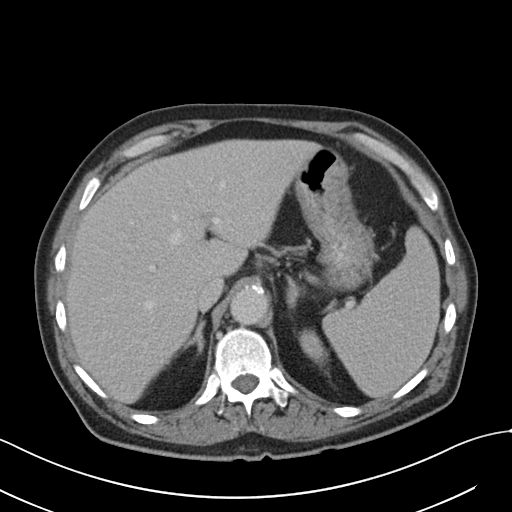
[im 81/96  soft-tissue]
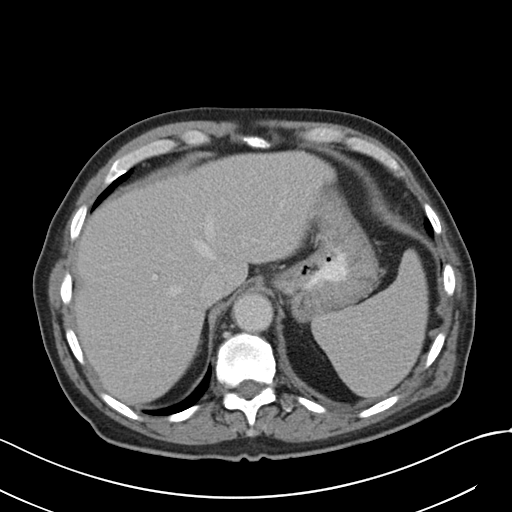
[im 91/96  soft-tissue]
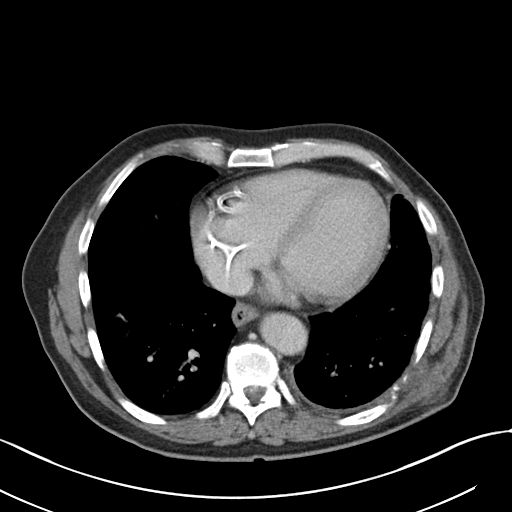

[Series 3: coronal a/|p · coronal · 0.70mm/px · 3 of 89 slices shown]
[im 30/89  soft-tissue]
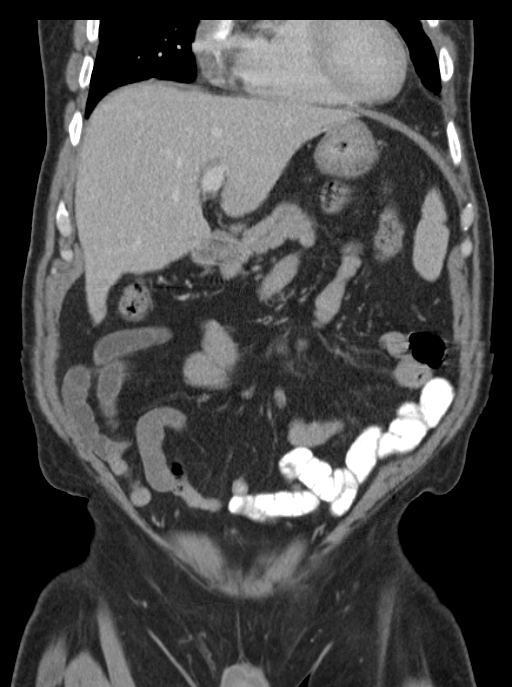
[im 40/89  soft-tissue]
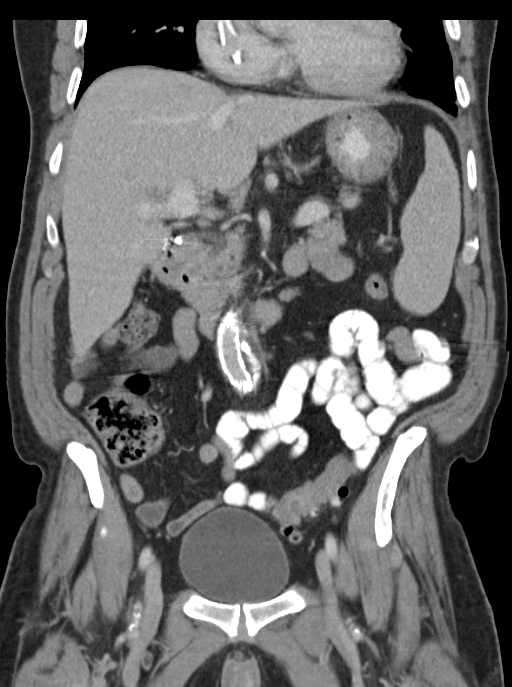
[im 49/89  soft-tissue]
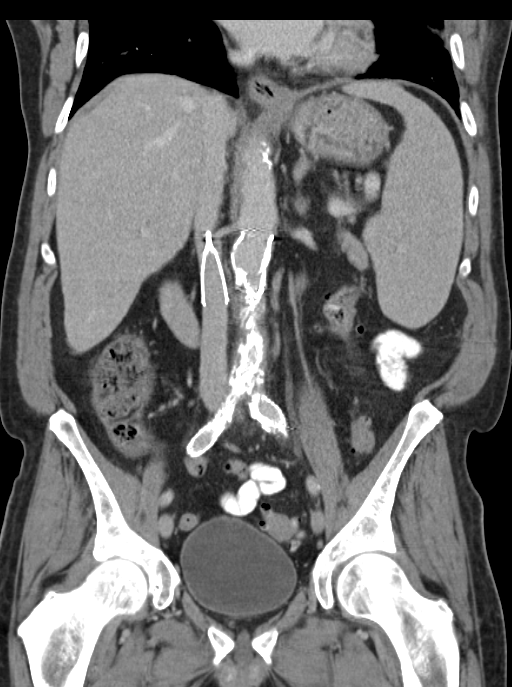

[15 of 46 positions shown; findings below may reference images not displayed]

FINDINGS: Lower Chest: Borderline cardiomegaly. Port a catheter tip in the
upper right atrium. Probable stent in the right coronary artery.
Unremarkable distal thoracic esophagus. Mild dependent atelectasis
in the lower lungs. No focal consolidation.

Abdomen: Unremarkable CT appearance of the stomach and duodenum save
for a small periampullary duodenum diverticulum. Unremarkable
appearance of the pancreas. The spleen is enlarged at 17.5 cm in
craniocaudal dimension. Normal hepatic contour and morphology. No
cirrhotic change. 4 mm low-attenuation lesion in hepatic segment 2
is too small for accurate characterization but statistically highly
likely a benign cyst.

The gallbladder is surgically absent. No intra or extrahepatic
biliary ductal dilatation.

No evidence of hydronephrosis or nephrolithiasis. No enhancing renal
mass. Circumscribed water attenuation renal cysts bilaterally.
Stable 1.8 cm left adrenal nodule dating back to at least Friday June, 2013. This likely represents a benign adenoma.

Colonic diverticular disease without CT evidence of active
inflammation. No evidence of obstruction or focal bowel wall
thickening. Normal appendix in the right lower quadrant. The
terminal ileum is unremarkable. No free fluid or suspicious
adenopathy.

Pelvis: Marked prostatomegaly. Unremarkable bladder. No free fluid
or suspicious adenopathy.

Bones/Soft Tissues: No acute fracture or aggressive appearing lytic
or blastic osseous lesion. Focal L5-S1 degenerative disc disease.

Vascular: Endovascular aortic repair with stable excluded aneurysm
sac and no evidence of endoleak or other complicating feature.
Maximal aortic diameter is 4.2 cm.
IMPRESSION: 1. No acute abnormality in the abdomen or pelvis to explain the
patient's clinical symptoms.
2. Colonic diverticular disease without CT evidence of active
inflammation.
3. Splenomegaly again noted.
4. Additional ancillary findings as above without evidence of
interval change.
